# Patient Record
Sex: Female | Born: 1940 | Race: White | Hispanic: No | Marital: Married | Smoking: Former smoker
Health system: Southern US, Community
[De-identification: ages and names within clinical notes are randomized; demographics above are authoritative.]

## PROBLEM LIST (undated history)

## (undated) DIAGNOSIS — M810 Age-related osteoporosis without current pathological fracture: Secondary | ICD-10-CM

## (undated) DIAGNOSIS — M1990 Unspecified osteoarthritis, unspecified site: Secondary | ICD-10-CM

## (undated) DIAGNOSIS — M549 Dorsalgia, unspecified: Secondary | ICD-10-CM

## (undated) DIAGNOSIS — Z789 Other specified health status: Secondary | ICD-10-CM

## (undated) DIAGNOSIS — G4700 Insomnia, unspecified: Secondary | ICD-10-CM

## (undated) HISTORY — PX: HEMORROIDECTOMY: SUR656

## (undated) HISTORY — PX: BREAST LUMPECTOMY: SHX2

## (undated) HISTORY — PX: GASTRIC BYPASS: SHX52

## (undated) HISTORY — PX: ABDOMINAL HYSTERECTOMY: SHX81

## (undated) HISTORY — PX: COSMETIC SURGERY: SHX468

## (undated) HISTORY — PX: SHOULDER SURGERY: SHX246

## (undated) HISTORY — PX: REPLACEMENT TOTAL KNEE: SUR1224

## (undated) HISTORY — PX: KNEE ARTHROSCOPY: SUR90

---

## 2006-04-30 ENCOUNTER — Ambulatory Visit: Payer: Self-pay | Admitting: Gastroenterology

## 2006-10-16 ENCOUNTER — Ambulatory Visit: Payer: Self-pay | Admitting: Internal Medicine

## 2006-10-27 ENCOUNTER — Ambulatory Visit: Payer: Self-pay | Admitting: Internal Medicine

## 2006-11-17 ENCOUNTER — Ambulatory Visit: Payer: Self-pay | Admitting: Specialist

## 2007-08-18 ENCOUNTER — Ambulatory Visit: Payer: Self-pay | Admitting: Internal Medicine

## 2009-05-11 ENCOUNTER — Ambulatory Visit: Payer: Self-pay | Admitting: Unknown Physician Specialty

## 2009-05-19 ENCOUNTER — Ambulatory Visit: Payer: Self-pay | Admitting: Unknown Physician Specialty

## 2009-05-31 ENCOUNTER — Ambulatory Visit: Payer: Self-pay | Admitting: Unknown Physician Specialty

## 2009-10-25 ENCOUNTER — Ambulatory Visit: Payer: Self-pay | Admitting: Internal Medicine

## 2009-11-02 ENCOUNTER — Ambulatory Visit: Payer: Self-pay | Admitting: Internal Medicine

## 2010-09-07 ENCOUNTER — Ambulatory Visit: Payer: Self-pay | Admitting: Family

## 2010-10-26 ENCOUNTER — Ambulatory Visit: Payer: Self-pay | Admitting: Internal Medicine

## 2010-11-26 ENCOUNTER — Ambulatory Visit: Payer: Self-pay | Admitting: Internal Medicine

## 2011-01-17 ENCOUNTER — Ambulatory Visit: Payer: Self-pay | Admitting: Bariatrics

## 2011-02-26 ENCOUNTER — Ambulatory Visit: Payer: Self-pay | Admitting: Unknown Physician Specialty

## 2012-02-24 ENCOUNTER — Ambulatory Visit: Payer: Self-pay | Admitting: Orthopaedic Surgery

## 2012-05-23 ENCOUNTER — Ambulatory Visit: Payer: Self-pay | Admitting: Internal Medicine

## 2012-07-29 ENCOUNTER — Ambulatory Visit: Payer: Self-pay | Admitting: Internal Medicine

## 2012-10-16 ENCOUNTER — Ambulatory Visit: Payer: Self-pay | Admitting: Otolaryngology

## 2013-05-03 DIAGNOSIS — Z961 Presence of intraocular lens: Secondary | ICD-10-CM | POA: Diagnosis not present

## 2013-05-07 DIAGNOSIS — J328 Other chronic sinusitis: Secondary | ICD-10-CM | POA: Diagnosis not present

## 2013-05-07 DIAGNOSIS — J342 Deviated nasal septum: Secondary | ICD-10-CM | POA: Diagnosis not present

## 2013-05-07 DIAGNOSIS — J018 Other acute sinusitis: Secondary | ICD-10-CM | POA: Diagnosis not present

## 2013-05-07 DIAGNOSIS — R51 Headache: Secondary | ICD-10-CM | POA: Diagnosis not present

## 2013-05-24 ENCOUNTER — Ambulatory Visit: Payer: Self-pay | Admitting: Otolaryngology

## 2013-05-24 DIAGNOSIS — S022XXA Fracture of nasal bones, initial encounter for closed fracture: Secondary | ICD-10-CM | POA: Diagnosis not present

## 2013-05-24 DIAGNOSIS — J3489 Other specified disorders of nose and nasal sinuses: Secondary | ICD-10-CM | POA: Diagnosis not present

## 2013-05-24 DIAGNOSIS — M2749 Other cysts of jaw: Secondary | ICD-10-CM | POA: Diagnosis not present

## 2013-05-24 DIAGNOSIS — L988 Other specified disorders of the skin and subcutaneous tissue: Secondary | ICD-10-CM | POA: Diagnosis not present

## 2013-08-03 DIAGNOSIS — L821 Other seborrheic keratosis: Secondary | ICD-10-CM | POA: Diagnosis not present

## 2013-08-03 DIAGNOSIS — D1801 Hemangioma of skin and subcutaneous tissue: Secondary | ICD-10-CM | POA: Diagnosis not present

## 2013-08-03 DIAGNOSIS — M6740 Ganglion, unspecified site: Secondary | ICD-10-CM | POA: Diagnosis not present

## 2013-08-03 DIAGNOSIS — D485 Neoplasm of uncertain behavior of skin: Secondary | ICD-10-CM | POA: Diagnosis not present

## 2013-08-05 ENCOUNTER — Ambulatory Visit: Payer: Self-pay | Admitting: Internal Medicine

## 2013-08-05 DIAGNOSIS — S0990XA Unspecified injury of head, initial encounter: Secondary | ICD-10-CM | POA: Diagnosis not present

## 2013-08-05 DIAGNOSIS — R51 Headache: Secondary | ICD-10-CM | POA: Diagnosis not present

## 2013-08-09 DIAGNOSIS — N644 Mastodynia: Secondary | ICD-10-CM | POA: Diagnosis not present

## 2013-08-09 DIAGNOSIS — Z9189 Other specified personal risk factors, not elsewhere classified: Secondary | ICD-10-CM | POA: Diagnosis not present

## 2013-08-16 DIAGNOSIS — J328 Other chronic sinusitis: Secondary | ICD-10-CM | POA: Diagnosis not present

## 2013-08-18 DIAGNOSIS — Z9884 Bariatric surgery status: Secondary | ICD-10-CM | POA: Diagnosis not present

## 2013-09-08 DIAGNOSIS — I10 Essential (primary) hypertension: Secondary | ICD-10-CM | POA: Diagnosis not present

## 2013-09-08 DIAGNOSIS — G4733 Obstructive sleep apnea (adult) (pediatric): Secondary | ICD-10-CM | POA: Diagnosis not present

## 2013-09-09 DIAGNOSIS — Z13820 Encounter for screening for osteoporosis: Secondary | ICD-10-CM | POA: Diagnosis not present

## 2013-09-09 DIAGNOSIS — Z780 Asymptomatic menopausal state: Secondary | ICD-10-CM | POA: Diagnosis not present

## 2013-09-09 DIAGNOSIS — E2839 Other primary ovarian failure: Secondary | ICD-10-CM | POA: Diagnosis not present

## 2013-09-09 DIAGNOSIS — Z1231 Encounter for screening mammogram for malignant neoplasm of breast: Secondary | ICD-10-CM | POA: Diagnosis not present

## 2013-09-09 DIAGNOSIS — R928 Other abnormal and inconclusive findings on diagnostic imaging of breast: Secondary | ICD-10-CM | POA: Diagnosis not present

## 2013-09-14 DIAGNOSIS — C44319 Basal cell carcinoma of skin of other parts of face: Secondary | ICD-10-CM | POA: Diagnosis not present

## 2013-09-14 DIAGNOSIS — Z85828 Personal history of other malignant neoplasm of skin: Secondary | ICD-10-CM | POA: Diagnosis not present

## 2013-09-27 DIAGNOSIS — Z0000 Encounter for general adult medical examination without abnormal findings: Secondary | ICD-10-CM | POA: Diagnosis not present

## 2013-10-07 ENCOUNTER — Ambulatory Visit: Payer: Self-pay | Admitting: Internal Medicine

## 2013-10-07 DIAGNOSIS — M79609 Pain in unspecified limb: Secondary | ICD-10-CM | POA: Diagnosis not present

## 2013-10-07 DIAGNOSIS — M259 Joint disorder, unspecified: Secondary | ICD-10-CM | POA: Diagnosis not present

## 2013-10-07 DIAGNOSIS — G4733 Obstructive sleep apnea (adult) (pediatric): Secondary | ICD-10-CM | POA: Diagnosis not present

## 2013-10-07 DIAGNOSIS — I10 Essential (primary) hypertension: Secondary | ICD-10-CM | POA: Diagnosis not present

## 2013-10-07 DIAGNOSIS — Q684 Congenital bowing of tibia and fibula: Secondary | ICD-10-CM | POA: Diagnosis not present

## 2013-10-07 DIAGNOSIS — S82109A Unspecified fracture of upper end of unspecified tibia, initial encounter for closed fracture: Secondary | ICD-10-CM | POA: Diagnosis not present

## 2013-10-11 DIAGNOSIS — K219 Gastro-esophageal reflux disease without esophagitis: Secondary | ICD-10-CM | POA: Diagnosis not present

## 2013-10-11 DIAGNOSIS — M2010 Hallux valgus (acquired), unspecified foot: Secondary | ICD-10-CM | POA: Diagnosis not present

## 2013-10-11 DIAGNOSIS — E785 Hyperlipidemia, unspecified: Secondary | ICD-10-CM | POA: Diagnosis not present

## 2013-10-11 DIAGNOSIS — Q684 Congenital bowing of tibia and fibula: Secondary | ICD-10-CM | POA: Diagnosis not present

## 2013-11-15 DIAGNOSIS — J45909 Unspecified asthma, uncomplicated: Secondary | ICD-10-CM | POA: Diagnosis not present

## 2013-11-15 DIAGNOSIS — G4733 Obstructive sleep apnea (adult) (pediatric): Secondary | ICD-10-CM | POA: Diagnosis not present

## 2013-11-15 DIAGNOSIS — I10 Essential (primary) hypertension: Secondary | ICD-10-CM | POA: Diagnosis not present

## 2013-11-15 DIAGNOSIS — J209 Acute bronchitis, unspecified: Secondary | ICD-10-CM | POA: Diagnosis not present

## 2013-11-17 ENCOUNTER — Ambulatory Visit: Payer: Self-pay | Admitting: Internal Medicine

## 2013-11-17 DIAGNOSIS — R079 Chest pain, unspecified: Secondary | ICD-10-CM | POA: Diagnosis not present

## 2013-11-17 DIAGNOSIS — J690 Pneumonitis due to inhalation of food and vomit: Secondary | ICD-10-CM | POA: Diagnosis not present

## 2013-11-17 DIAGNOSIS — R062 Wheezing: Secondary | ICD-10-CM | POA: Diagnosis not present

## 2013-11-17 DIAGNOSIS — J189 Pneumonia, unspecified organism: Secondary | ICD-10-CM | POA: Diagnosis not present

## 2013-11-17 DIAGNOSIS — R059 Cough, unspecified: Secondary | ICD-10-CM | POA: Diagnosis not present

## 2014-01-17 DIAGNOSIS — Z23 Encounter for immunization: Secondary | ICD-10-CM | POA: Diagnosis not present

## 2014-02-23 DIAGNOSIS — K589 Irritable bowel syndrome without diarrhea: Secondary | ICD-10-CM | POA: Diagnosis not present

## 2014-02-23 DIAGNOSIS — Z9884 Bariatric surgery status: Secondary | ICD-10-CM | POA: Diagnosis not present

## 2014-03-28 DIAGNOSIS — G4733 Obstructive sleep apnea (adult) (pediatric): Secondary | ICD-10-CM | POA: Diagnosis not present

## 2014-03-28 DIAGNOSIS — H538 Other visual disturbances: Secondary | ICD-10-CM | POA: Diagnosis not present

## 2014-03-29 DIAGNOSIS — H538 Other visual disturbances: Secondary | ICD-10-CM | POA: Diagnosis not present

## 2014-03-29 DIAGNOSIS — I10 Essential (primary) hypertension: Secondary | ICD-10-CM | POA: Diagnosis not present

## 2014-03-29 DIAGNOSIS — R5381 Other malaise: Secondary | ICD-10-CM | POA: Diagnosis not present

## 2014-03-29 DIAGNOSIS — E784 Other hyperlipidemia: Secondary | ICD-10-CM | POA: Diagnosis not present

## 2014-03-29 DIAGNOSIS — E441 Mild protein-calorie malnutrition: Secondary | ICD-10-CM | POA: Diagnosis not present

## 2014-04-07 DIAGNOSIS — G4733 Obstructive sleep apnea (adult) (pediatric): Secondary | ICD-10-CM | POA: Diagnosis not present

## 2014-04-07 DIAGNOSIS — I10 Essential (primary) hypertension: Secondary | ICD-10-CM | POA: Diagnosis not present

## 2014-04-07 DIAGNOSIS — E441 Mild protein-calorie malnutrition: Secondary | ICD-10-CM | POA: Diagnosis not present

## 2014-04-07 DIAGNOSIS — K219 Gastro-esophageal reflux disease without esophagitis: Secondary | ICD-10-CM | POA: Diagnosis not present

## 2014-04-11 DIAGNOSIS — H59093 Other disorders of the eye following cataract surgery, bilateral: Secondary | ICD-10-CM | POA: Diagnosis not present

## 2014-04-17 DIAGNOSIS — H66001 Acute suppurative otitis media without spontaneous rupture of ear drum, right ear: Secondary | ICD-10-CM | POA: Diagnosis not present

## 2014-04-17 DIAGNOSIS — J0100 Acute maxillary sinusitis, unspecified: Secondary | ICD-10-CM | POA: Diagnosis not present

## 2014-04-22 DIAGNOSIS — H26492 Other secondary cataract, left eye: Secondary | ICD-10-CM | POA: Diagnosis not present

## 2014-04-25 ENCOUNTER — Ambulatory Visit: Payer: Self-pay | Admitting: Internal Medicine

## 2014-04-25 DIAGNOSIS — J209 Acute bronchitis, unspecified: Secondary | ICD-10-CM | POA: Diagnosis not present

## 2014-04-25 DIAGNOSIS — J320 Chronic maxillary sinusitis: Secondary | ICD-10-CM | POA: Diagnosis not present

## 2014-04-25 DIAGNOSIS — R05 Cough: Secondary | ICD-10-CM | POA: Diagnosis not present

## 2014-04-25 DIAGNOSIS — I10 Essential (primary) hypertension: Secondary | ICD-10-CM | POA: Diagnosis not present

## 2014-04-25 DIAGNOSIS — J45909 Unspecified asthma, uncomplicated: Secondary | ICD-10-CM | POA: Diagnosis not present

## 2014-04-28 DIAGNOSIS — G4733 Obstructive sleep apnea (adult) (pediatric): Secondary | ICD-10-CM | POA: Diagnosis not present

## 2014-04-28 DIAGNOSIS — I10 Essential (primary) hypertension: Secondary | ICD-10-CM | POA: Diagnosis not present

## 2014-04-28 DIAGNOSIS — J209 Acute bronchitis, unspecified: Secondary | ICD-10-CM | POA: Diagnosis not present

## 2014-05-16 DIAGNOSIS — H26491 Other secondary cataract, right eye: Secondary | ICD-10-CM | POA: Diagnosis not present

## 2014-08-26 DIAGNOSIS — H01119 Allergic dermatitis of unspecified eye, unspecified eyelid: Secondary | ICD-10-CM | POA: Diagnosis not present

## 2014-08-26 DIAGNOSIS — J309 Allergic rhinitis, unspecified: Secondary | ICD-10-CM | POA: Diagnosis not present

## 2014-09-01 DIAGNOSIS — G4733 Obstructive sleep apnea (adult) (pediatric): Secondary | ICD-10-CM | POA: Diagnosis not present

## 2014-09-01 DIAGNOSIS — I10 Essential (primary) hypertension: Secondary | ICD-10-CM | POA: Diagnosis not present

## 2014-09-01 DIAGNOSIS — R5381 Other malaise: Secondary | ICD-10-CM | POA: Diagnosis not present

## 2014-09-01 DIAGNOSIS — R238 Other skin changes: Secondary | ICD-10-CM | POA: Diagnosis not present

## 2014-09-07 DIAGNOSIS — M810 Age-related osteoporosis without current pathological fracture: Secondary | ICD-10-CM | POA: Diagnosis not present

## 2014-09-12 DIAGNOSIS — R921 Mammographic calcification found on diagnostic imaging of breast: Secondary | ICD-10-CM | POA: Diagnosis not present

## 2014-09-12 DIAGNOSIS — R922 Inconclusive mammogram: Secondary | ICD-10-CM | POA: Diagnosis not present

## 2014-09-12 DIAGNOSIS — Z1231 Encounter for screening mammogram for malignant neoplasm of breast: Secondary | ICD-10-CM | POA: Diagnosis not present

## 2014-09-16 ENCOUNTER — Observation Stay
Admission: EM | Admit: 2014-09-16 | Discharge: 2014-09-18 | Disposition: A | Discharge status: Home / Self Care | Payer: Medicare Other | Attending: Internal Medicine | Admitting: Internal Medicine

## 2014-09-16 DIAGNOSIS — Z90710 Acquired absence of both cervix and uterus: Secondary | ICD-10-CM | POA: Diagnosis not present

## 2014-09-16 DIAGNOSIS — Z809 Family history of malignant neoplasm, unspecified: Secondary | ICD-10-CM | POA: Insufficient documentation

## 2014-09-16 DIAGNOSIS — S32000A Wedge compression fracture of unspecified lumbar vertebra, initial encounter for closed fracture: Secondary | ICD-10-CM

## 2014-09-16 DIAGNOSIS — M5126 Other intervertebral disc displacement, lumbar region: Secondary | ICD-10-CM | POA: Diagnosis not present

## 2014-09-16 DIAGNOSIS — Z8249 Family history of ischemic heart disease and other diseases of the circulatory system: Secondary | ICD-10-CM | POA: Insufficient documentation

## 2014-09-16 DIAGNOSIS — R262 Difficulty in walking, not elsewhere classified: Secondary | ICD-10-CM | POA: Diagnosis not present

## 2014-09-16 DIAGNOSIS — R29890 Loss of height: Secondary | ICD-10-CM | POA: Insufficient documentation

## 2014-09-16 DIAGNOSIS — Z87891 Personal history of nicotine dependence: Secondary | ICD-10-CM | POA: Insufficient documentation

## 2014-09-16 DIAGNOSIS — M25551 Pain in right hip: Secondary | ICD-10-CM | POA: Insufficient documentation

## 2014-09-16 DIAGNOSIS — I10 Essential (primary) hypertension: Secondary | ICD-10-CM | POA: Diagnosis not present

## 2014-09-16 DIAGNOSIS — M549 Dorsalgia, unspecified: Secondary | ICD-10-CM | POA: Diagnosis not present

## 2014-09-16 DIAGNOSIS — Z9884 Bariatric surgery status: Secondary | ICD-10-CM | POA: Insufficient documentation

## 2014-09-16 DIAGNOSIS — G319 Degenerative disease of nervous system, unspecified: Secondary | ICD-10-CM | POA: Insufficient documentation

## 2014-09-16 DIAGNOSIS — W19XXXA Unspecified fall, initial encounter: Secondary | ICD-10-CM | POA: Diagnosis not present

## 2014-09-16 DIAGNOSIS — I739 Peripheral vascular disease, unspecified: Secondary | ICD-10-CM | POA: Insufficient documentation

## 2014-09-16 DIAGNOSIS — Z96659 Presence of unspecified artificial knee joint: Secondary | ICD-10-CM | POA: Diagnosis not present

## 2014-09-16 DIAGNOSIS — Z880 Allergy status to penicillin: Secondary | ICD-10-CM | POA: Diagnosis not present

## 2014-09-16 DIAGNOSIS — S0093XA Contusion of unspecified part of head, initial encounter: Secondary | ICD-10-CM | POA: Diagnosis not present

## 2014-09-16 DIAGNOSIS — Z79899 Other long term (current) drug therapy: Secondary | ICD-10-CM | POA: Insufficient documentation

## 2014-09-16 DIAGNOSIS — Z8489 Family history of other specified conditions: Secondary | ICD-10-CM | POA: Diagnosis not present

## 2014-09-16 DIAGNOSIS — E876 Hypokalemia: Secondary | ICD-10-CM | POA: Diagnosis not present

## 2014-09-16 DIAGNOSIS — S32591A Other specified fracture of right pubis, initial encounter for closed fracture: Secondary | ICD-10-CM | POA: Diagnosis not present

## 2014-09-16 DIAGNOSIS — S32030A Wedge compression fracture of third lumbar vertebra, initial encounter for closed fracture: Secondary | ICD-10-CM | POA: Diagnosis not present

## 2014-09-16 DIAGNOSIS — S32039A Unspecified fracture of third lumbar vertebra, initial encounter for closed fracture: Secondary | ICD-10-CM | POA: Diagnosis not present

## 2014-09-16 DIAGNOSIS — Y93H9 Activity, other involving exterior property and land maintenance, building and construction: Secondary | ICD-10-CM | POA: Diagnosis not present

## 2014-09-16 DIAGNOSIS — S79911A Unspecified injury of right hip, initial encounter: Secondary | ICD-10-CM | POA: Diagnosis not present

## 2014-09-16 DIAGNOSIS — S0990XA Unspecified injury of head, initial encounter: Secondary | ICD-10-CM | POA: Diagnosis not present

## 2014-09-16 DIAGNOSIS — Z7982 Long term (current) use of aspirin: Secondary | ICD-10-CM | POA: Diagnosis not present

## 2014-09-16 DIAGNOSIS — S3992XA Unspecified injury of lower back, initial encounter: Secondary | ICD-10-CM | POA: Diagnosis not present

## 2014-09-16 HISTORY — DX: Other specified health status: Z78.9

## 2014-09-16 NOTE — ED Notes (Addendum)
Pt presents to ED by [facility] after she fell outside when she stepped in a hole in her driveway approx 6 hours ago while pressure washing her truck. VS within normal limits and FSBS 105 per EMS. Pt c/o right groin pain and is said to be non-weight bearing on her right leg. Pt states her pain sitting up was too severe to attempt to driving herself to ED. Pt states she also hit the back of her head on her truck. Denies loc. Pt able to answer questions without difficulty. Speech clear. No distress or obvious deformities noted.

## 2014-09-17 ENCOUNTER — Emergency Department: Payer: Medicare Other

## 2014-09-17 ENCOUNTER — Encounter: Payer: Self-pay | Admitting: Emergency Medicine

## 2014-09-17 DIAGNOSIS — S32039A Unspecified fracture of third lumbar vertebra, initial encounter for closed fracture: Secondary | ICD-10-CM | POA: Diagnosis not present

## 2014-09-17 DIAGNOSIS — M25551 Pain in right hip: Secondary | ICD-10-CM | POA: Diagnosis not present

## 2014-09-17 DIAGNOSIS — S32501A Unspecified fracture of right pubis, initial encounter for closed fracture: Secondary | ICD-10-CM | POA: Diagnosis not present

## 2014-09-17 DIAGNOSIS — S32591A Other specified fracture of right pubis, initial encounter for closed fracture: Secondary | ICD-10-CM | POA: Diagnosis present

## 2014-09-17 DIAGNOSIS — S0990XA Unspecified injury of head, initial encounter: Secondary | ICD-10-CM | POA: Diagnosis not present

## 2014-09-17 DIAGNOSIS — M545 Low back pain: Secondary | ICD-10-CM | POA: Diagnosis not present

## 2014-09-17 DIAGNOSIS — E876 Hypokalemia: Secondary | ICD-10-CM | POA: Diagnosis not present

## 2014-09-17 DIAGNOSIS — S79911A Unspecified injury of right hip, initial encounter: Secondary | ICD-10-CM | POA: Diagnosis not present

## 2014-09-17 DIAGNOSIS — S32030A Wedge compression fracture of third lumbar vertebra, initial encounter for closed fracture: Secondary | ICD-10-CM | POA: Diagnosis present

## 2014-09-17 DIAGNOSIS — S32020A Wedge compression fracture of second lumbar vertebra, initial encounter for closed fracture: Secondary | ICD-10-CM | POA: Diagnosis not present

## 2014-09-17 DIAGNOSIS — S3992XA Unspecified injury of lower back, initial encounter: Secondary | ICD-10-CM | POA: Diagnosis not present

## 2014-09-17 LAB — COMPREHENSIVE METABOLIC PANEL
ALBUMIN: 3.4 g/dL — AB (ref 3.5–5.0)
ALK PHOS: 63 U/L (ref 38–126)
ALT: 27 U/L (ref 14–54)
AST: 45 U/L — ABNORMAL HIGH (ref 15–41)
Anion gap: 12 (ref 5–15)
BILIRUBIN TOTAL: 0.8 mg/dL (ref 0.3–1.2)
BUN: 10 mg/dL (ref 6–20)
CHLORIDE: 103 mmol/L (ref 101–111)
CO2: 26 mmol/L (ref 22–32)
CREATININE: 0.52 mg/dL (ref 0.44–1.00)
Calcium: 8.1 mg/dL — ABNORMAL LOW (ref 8.9–10.3)
GFR calc non Af Amer: >60 mL/min (ref >60)
Glucose, Bld: 100 mg/dL — ABNORMAL HIGH (ref 65–99)
POTASSIUM: 3.4 mmol/L — AB (ref 3.5–5.1)
SODIUM: 141 mmol/L (ref 135–145)
TOTAL PROTEIN: 5.7 g/dL — AB (ref 6.5–8.1)

## 2014-09-17 LAB — CBC
HEMATOCRIT: 39.1 % (ref 35.0–47.0)
HEMOGLOBIN: 12.8 g/dL (ref 12.0–16.0)
MCH: 31.4 pg (ref 26.0–34.0)
MCHC: 32.8 g/dL (ref 32.0–36.0)
MCV: 95.8 fL (ref 80.0–100.0)
PLATELETS: 168 10*3/uL (ref 150–440)
RBC: 4.09 MIL/uL (ref 3.80–5.20)
RDW: 13.7 % (ref 11.5–14.5)
WBC: 7.3 10*3/uL (ref 3.6–11.0)

## 2014-09-17 LAB — PROTIME-INR
INR: 0.97
PROTHROMBIN TIME: 13.1 s (ref 11.4–15.0)

## 2014-09-17 LAB — APTT: APTT: 29 s (ref 24–36)

## 2014-09-17 MED ORDER — HEPARIN SODIUM (PORCINE) 5000 UNIT/ML IJ SOLN
5000.0000 [IU] | Freq: Three times a day (TID) | INTRAMUSCULAR | Status: DC
  Administered 2014-09-17 – 2014-09-18 (×4): 5000 [IU] via SUBCUTANEOUS
  Filled 2014-09-17 (×4): qty 1

## 2014-09-17 MED ORDER — MORPHINE SULFATE 4 MG/ML IJ SOLN: 4.0000 mg | Freq: Once | INTRAMUSCULAR | Status: AC

## 2014-09-17 MED ORDER — BISACODYL 10 MG RE SUPP: 10.0000 mg | Freq: Every day | RECTAL | Status: DC | PRN

## 2014-09-17 MED ORDER — ESTROGENS CONJUGATED 0.625 MG PO TABS
0.6250 mg | ORAL_TABLET | Freq: Every day | ORAL | Status: DC
  Administered 2014-09-17 – 2014-09-18 (×2): 0.625 mg via ORAL
  Filled 2014-09-17 (×2): qty 1

## 2014-09-17 MED ORDER — DOCUSATE SODIUM 100 MG PO CAPS
100.0000 mg | ORAL_CAPSULE | Freq: Two times a day (BID) | ORAL | Status: DC
  Administered 2014-09-17 – 2014-09-18 (×3): 100 mg via ORAL
  Filled 2014-09-17 (×3): qty 1

## 2014-09-17 MED ORDER — POTASSIUM CHLORIDE CRYS ER 20 MEQ PO TBCR
40.0000 meq | EXTENDED_RELEASE_TABLET | Freq: Once | ORAL | Status: AC
  Administered 2014-09-17: 40 meq via ORAL
  Filled 2014-09-17: qty 2

## 2014-09-17 MED ORDER — CALCIUM CARBONATE ANTACID 500 MG PO CHEW
400.0000 mg | CHEWABLE_TABLET | Freq: Two times a day (BID) | ORAL | Status: DC
  Administered 2014-09-17 – 2014-09-18 (×3): 400 mg via ORAL
  Filled 2014-09-17 (×3): qty 2

## 2014-09-17 MED ORDER — ALPRAZOLAM 0.5 MG PO TABS
0.5000 mg | ORAL_TABLET | Freq: Every evening | ORAL | Status: DC | PRN
  Administered 2014-09-17: 0.5 mg via ORAL
  Filled 2014-09-17: qty 1

## 2014-09-17 MED ORDER — ACETAMINOPHEN 325 MG PO TABS: 650.0000 mg | ORAL_TABLET | Freq: Four times a day (QID) | ORAL | Status: DC | PRN

## 2014-09-17 MED ORDER — HYDROCODONE-ACETAMINOPHEN 5-325 MG PO TABS
1.0000 | ORAL_TABLET | ORAL | Status: DC | PRN
  Administered 2014-09-17 – 2014-09-18 (×6): 1 via ORAL
  Filled 2014-09-17 (×6): qty 1

## 2014-09-17 MED ORDER — MAGNESIUM HYDROXIDE 400 MG/5ML PO SUSP: 30.0000 mL | Freq: Every day | ORAL | Status: DC | PRN

## 2014-09-17 MED ORDER — MORPHINE SULFATE 10 MG/ML IJ SOLN
INTRAMUSCULAR | Status: AC
  Administered 2014-09-17: 4 mg
  Filled 2014-09-17: qty 1

## 2014-09-17 MED ORDER — ONDANSETRON HCL 4 MG/2ML IJ SOLN
4.0000 mg | Freq: Four times a day (QID) | INTRAMUSCULAR | Status: DC | PRN
  Administered 2014-09-17 (×2): 4 mg via INTRAVENOUS
  Filled 2014-09-17 (×3): qty 2

## 2014-09-17 MED ORDER — ADULT MULTIVITAMIN W/MINERALS CH
1.0000 | ORAL_TABLET | Freq: Two times a day (BID) | ORAL | Status: DC
  Administered 2014-09-17 – 2014-09-18 (×3): 1 via ORAL
  Filled 2014-09-17 (×3): qty 1

## 2014-09-17 MED ORDER — IBANDRONATE SODIUM 150 MG PO TABS: 150.0000 mg | ORAL_TABLET | ORAL | Status: DC

## 2014-09-17 MED ORDER — ONDANSETRON HCL 4 MG PO TABS
4.0000 mg | ORAL_TABLET | Freq: Four times a day (QID) | ORAL | Status: DC | PRN
  Filled 2014-09-17 (×2): qty 1

## 2014-09-17 MED ORDER — ONDANSETRON HCL 4 MG/2ML IJ SOLN
4.0000 mg | Freq: Once | INTRAMUSCULAR | Status: AC
  Administered 2014-09-17: 4 mg via INTRAVENOUS

## 2014-09-17 MED ORDER — ASPIRIN EC 81 MG PO TBEC
81.0000 mg | DELAYED_RELEASE_TABLET | Freq: Every day | ORAL | Status: DC
  Administered 2014-09-17 – 2014-09-18 (×2): 81 mg via ORAL
  Filled 2014-09-17 (×2): qty 1

## 2014-09-17 MED ORDER — VITAMIN D 1000 UNITS PO TABS
1000.0000 [IU] | ORAL_TABLET | Freq: Every day | ORAL | Status: DC
  Administered 2014-09-17 – 2014-09-18 (×2): 1000 [IU] via ORAL
  Filled 2014-09-17 (×2): qty 1

## 2014-09-17 MED ORDER — ACETAMINOPHEN 650 MG RE SUPP: 650.0000 mg | Freq: Four times a day (QID) | RECTAL | Status: DC | PRN

## 2014-09-17 MED ORDER — ONDANSETRON HCL 4 MG/2ML IJ SOLN
INTRAMUSCULAR | Status: AC
  Administered 2014-09-17: 4 mg via INTRAVENOUS
  Filled 2014-09-17: qty 2

## 2014-09-17 MED ORDER — CALCIUM CARBONATE 1250 (500 CA) MG PO TABS
1.0000 | ORAL_TABLET | Freq: Two times a day (BID) | ORAL | Status: DC
  Filled 2014-09-17 (×2): qty 1

## 2014-09-17 MED ORDER — ASPIRIN EC 81 MG PO TBEC: 81.0000 mg | DELAYED_RELEASE_TABLET | Freq: Every day | ORAL | Status: DC

## 2014-09-17 MED ORDER — MORPHINE SULFATE 4 MG/ML IJ SOLN: 4.0000 mg | INTRAMUSCULAR | Status: DC | PRN

## 2014-09-17 NOTE — H&P (Signed)
[place] [facility] [place] NAME: Sonya Park    MR#:  [id number]  DATE OF BIRTH:  24-Sep-1940  DATE OF ADMISSION:  09/16/2014  PRIMARY CARE PHYSICIAN: Cletis Athens, MD   REQUESTING/REFERRING PHYSICIAN: Maximino Greenland  CHIEF COMPLAINT:   Chief Complaint  Patient presents with  . Fall  . Groin Pain  . Hip Pain    HISTORY OF PRESENT ILLNESS:  Sonya Park  is a 74 y.o. female with no significant past medical history presents to the emergency room with the complaints of right groin pain and hip and back pain following mechanical fall sustaining yesterday afternoon. Patient states that while she was washing her car she stepped into a hole and fell backwards and hurt her back and groin. Since then she is having right groin and back pain and inability to walk head, hence came to the emergency room for evaluation. She also hit her head but denies any loss of consciousness. No focal weakness or numbness, no chest pain, no palpitations. In the emergency room patient was evaluated by the ED physician and x-rays revealed minimally displaced fracture of right inferior pubic ramus and a CT of the lumbar spine revealed compression fracture L3 vertebra. CT head was negative for any acute injury. Patient was given IV pain medications following which her pain is under reasonable control. She is not able to ablate without any help and continues to have intermittent pain, hence hospitalist service was consulted for further management.  PAST MEDICAL HISTORY:   Past Medical History  Diagnosis Date  . Medical history non-contributory     PAST SURGICAL HISTORY:   Past Surgical History  Procedure Laterality Date  . Abdominal hysterectomy    . Replacement total knee    . Gastric bypass      X2  . Hemoroid    . Cosmetic surgery    . Knee arthroscopy    . Joint replacement      SOCIAL HISTORY:   History  Substance Use Topics  . Smoking status: Former  Research scientist (life sciences)  . Smokeless tobacco: Never Used  . Alcohol Use: 1.8 oz/week    3 Glasses of wine per week    FAMILY HISTORY:   Family History  Problem Relation Age of Onset  . Liver disease Mother   . Heart attack Father   . Cancer Father   . CAD Sister     DRUG ALLERGIES:   Allergies  Allergen Reactions  . Penicillins Swelling    REVIEW OF SYSTEMS:   Review of Systems  Constitutional: Negative for fever, chills and malaise/fatigue.  HENT: Negative for ear pain, hearing loss, nosebleeds, sore throat and tinnitus.   Eyes: Negative for blurred vision, double vision, pain, discharge and redness.  Respiratory: Negative for cough, hemoptysis, sputum production, shortness of breath and wheezing.   Cardiovascular: Negative for chest pain, palpitations, orthopnea and leg swelling.  Gastrointestinal: Negative for nausea, vomiting, abdominal pain, diarrhea, constipation, blood in stool and melena.  Genitourinary: Negative for dysuria, urgency, frequency and hematuria.  Musculoskeletal: Positive for back pain and joint pain. Negative for neck pain.       Right hip pain and right groin pain as noted in history of present illness following mechanical fall.  Skin: Negative for itching and rash.  Neurological: Negative for dizziness, tingling, sensory change, focal weakness and seizures.  Endo/Heme/Allergies: Does not bruise/bleed easily.  Psychiatric/Behavioral: Negative for depression. The patient is not nervous/anxious.     MEDICATIONS AT  HOME:   Prior to Admission medications   Medication Sig Start Date End Date Taking? Authorizing Provider  ALPRAZolam Duanne Moron) 0.5 MG tablet Take 0.5 mg by mouth at bedtime as needed for anxiety.   Yes Historical Provider, MD  aspirin 81 MG tablet Take 81 mg by mouth daily.   Yes Historical Provider, MD  BIOTIN PO Take 1 capsule by mouth daily.   Yes Historical Provider, MD  CALCIUM CARBONATE PO Take 1 tablet by mouth 2 (two) times daily.   Yes Historical  Provider, MD  cholecalciferol (VITAMIN D) 1000 UNITS tablet Take 1,000 Units by mouth daily.   Yes Historical Provider, MD  DiphenhydrAMINE HCl (ZZZQUIL) 50 MG/30ML LIQD Take 30 mLs by mouth at bedtime as needed (for sleep).   Yes Historical Provider, MD  estrogens, conjugated, (PREMARIN) 0.625 MG tablet Take 0.625 mg by mouth daily. Take daily for 21 days then do not take for 7 days.   Yes Historical Provider, MD  ibandronate (BONIVA) 150 MG tablet Take 150 mg by mouth every 30 (thirty) days. Take in the morning with a full glass of water, on an empty stomach, and do not take anything else by mouth or lie down for the next 30 min.   Yes Historical Provider, MD  Multiple Vitamins-Minerals (MULTIVITAMIN PO) Take 1 tablet by mouth 2 (two) times daily.   Yes Historical Provider, MD  Probiotic Product (PROBIOTIC PO) Take 1 capsule by mouth daily.   Yes Historical Provider, MD      VITAL SIGNS:  Blood pressure 137/80, pulse 87, temperature 98 F (36.7 C), temperature source Oral, resp. rate 20, height 5\' 7"  (1.702 m), weight 70.761 kg (156 lb), SpO2 97 %.  PHYSICAL EXAMINATION:  Physical Exam  Constitutional: She is oriented to person, place, and time. She appears well-developed and well-nourished.  HENT:  Head: Normocephalic and atraumatic.  Right Ear: External ear normal.  Left Ear: External ear normal.  Nose: Nose normal.  Mouth/Throat: Oropharynx is clear and moist. No oropharyngeal exudate.  Eyes: EOM are normal. Pupils are equal, round, and reactive to light. No scleral icterus.  Neck: Normal range of motion. Neck supple. No JVD present. No thyromegaly present.  Cardiovascular: Normal rate, regular rhythm, normal heart sounds and intact distal pulses.  Exam reveals no friction rub.   No murmur heard. Respiratory: Effort normal and breath sounds normal. No respiratory distress. She has no wheezes. She has no rales. She exhibits no tenderness.  GI: Soft. Bowel sounds are normal. She  exhibits no distension and no mass. There is no tenderness. There is no rebound and no guarding.  Musculoskeletal: She exhibits no edema.  Decreased range of motion of right hip. Tenderness over L3-L4 area.  Lymphadenopathy:    She has no cervical adenopathy.  Neurological: She is alert and oriented to person, place, and time. She has normal reflexes. She displays normal reflexes. No cranial nerve deficit. She exhibits normal muscle tone.  Skin: Skin is warm. No rash noted. No erythema.  Psychiatric: She has a normal mood and affect. Her behavior is normal. Thought content normal.   LABORATORY PANEL:   CBC  Recent Labs Lab 09/17/14 0141  WBC 7.3  HGB 12.8  HCT 39.1  PLT 168   ------------------------------------------------------------------------------------------------------------------  Chemistries   Recent Labs Lab 09/17/14 0141  NA 141  K 3.4*  CL 103  CO2 26  GLUCOSE 100*  BUN 10  CREATININE 0.52  CALCIUM 8.1*  AST 45*  ALT 27  ALKPHOS  63  BILITOT 0.8   ------------------------------------------------------------------------------------------------------------------  Cardiac Enzymes No results for input(s): TROPONINI in the last 168 hours. ------------------------------------------------------------------------------------------------------------------  RADIOLOGY:  Dg Lumbar Spine 2-3 Views  09/17/2014   CLINICAL DATA:  Status post fall, with groin pain and hip pain. Initial encounter.  EXAM: LUMBAR SPINE - 2-3 VIEW  COMPARISON:  CT of the abdomen and pelvis from 01/17/2011  FINDINGS: There is slight cortical irregularity along the left side of the superior endplate of L3, new from 2012. Would correlate for any associated symptoms, to exclude an underlying small endplate fracture. This could be chronic in nature. Vertebral bodies demonstrate normal alignment. Intervertebral disc spaces are preserved.  The visualized bowel gas pattern is unremarkable in  appearance; air and stool are noted within the colon. The sacroiliac joints are within normal limits.  IMPRESSION: Slight cortical irregularity along the left side of the superior endplate of L3, new from 2012. Would correlate for any associated symptoms, to exclude an underlying small template fracture. This could be chronic in nature.   Electronically Signed   By: Garald Balding M.D.   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 Concern for head injury. Initial encounter.  EXAM: CT HEAD WITHOUT CONTRAST  TECHNIQUE: Contiguous axial images were obtained from the base of the skull through the vertex without intravenous contrast.  COMPARISON:  CT of the head performed 08/05/2013  FINDINGS: There is no evidence of acute infarction, mass lesion, or intra- or extra-axial hemorrhage on CT.  Prominence of the ventricles and sulci reflects mild cortical volume loss. Mild cerebellar atrophy is noted. Scattered periventricular and subcortical white matter change likely reflects small vessel ischemic microangiopathy.  The brainstem and fourth ventricle are within normal limits. The basal ganglia are unremarkable in appearance. The cerebral hemispheres demonstrate grossly normal gray-white differentiation. No mass effect or midline shift is seen.  There is no evidence of fracture; visualized osseous structures are unremarkable in appearance. The visualized portions of the orbits are within normal limits. The paranasal sinuses and mastoid air cells are well-aerated. No significant soft tissue abnormalities are seen.  IMPRESSION: 1. No evidence of traumatic intracranial injury or fracture. 2. Mild cortical volume loss and scattered small vessel ischemic microangiopathy.   Electronically Signed   By: Garald Balding M.D.   On: 09/17/2014 01:16   Ct Lumbar Spine Wo Contrast  09/17/2014   CLINICAL DATA:  Right groin pain and unable to bear weight on right lower  extremity after falling when stepping in a hole.  EXAM: CT LUMBAR SPINE WITHOUT CONTRAST  TECHNIQUE: Multidetector CT imaging of the lumbar spine was performed without intravenous contrast administration. Multiplanar CT image reconstructions were also generated.  COMPARISON:  Radiographs 09/17/2014  FINDINGS: There is an acute fracture involving the left superior aspect of the L3 vertebral body with mild loss of height of approximately 6 mm. Pedicles are intact. Posterior elements are intact. Facet articulations are intact. Remainder of the lumbar vertebrae are intact.  There is no significant paraspinal hematoma.  There are mild circumferential disc bulge is at all of the lumbar levels, without significant central canal stenosis.  IMPRESSION: Acute fracture of the left superior aspect of the L3 vertebral body with superior endplate impaction by about 6 mm. This is a stable fracture.   Electronically Signed   By: Andreas Newport M.D.   On: 09/17/2014 03:24   Dg Hip Unilat With Pelvis 2-3  Views Right  09/17/2014   CLINICAL DATA:  Acute onset of right hip pain, status post fall. Initial encounter.  EXAM: RIGHT HIP (WITH PELVIS) 2-3 VIEWS  COMPARISON:  None.  FINDINGS: There is mild cortical irregularity along the right inferior pubic ramus, which could reflect a minimally displaced fracture. No additional fractures are seen.  Both femoral heads are seated normally within their respective acetabula. The proximal right femur appears intact. Mild degenerative change is noted at the lower lumbar spine. The sacroiliac joints are unremarkable in appearance.  The visualized bowel gas pattern is grossly unremarkable in appearance. Scattered phleboliths are noted within the pelvis.  IMPRESSION: Mild cortical irregularity along the right inferior pubic ramus could reflect a minimally displaced fracture.   Electronically Signed   By: Garald Balding M.D.   On: 09/17/2014 01:35    EKG:   Orders placed or performed during  the hospital encounter of 09/16/14  . ED EKG pending at this time   . ED EKG    IMPRESSION AND PLAN:   1. Fracture right inferior pubic ramus following mechanical fall. 2. Compression fracture L3 vertebra following mechanical fall. 3. Hypokalemia, mild.  Plan: Admit to MedSurg, IV pain control meds, PT consultation, Ortho consultation requested. Potassium supplementation, follow-up BMP.    All the records are reviewed and case discussed with ED provider. Management plans discussed with the patient, family and they are in agreement.  CODE STATUS: Full code  TOTAL TIME TAKING CARE OF THIS PATIENT: 50 minutes.    Juluis Mire M.D on 09/17/2014 at 5:01 AM  Between 7am to 6pm - Pager - [phone]  After 6pm go to [email or web address] - password [email or web address] [facility]  Office  [phone]  CC: Primary care physician; Cletis Athens, MD

## 2014-09-17 NOTE — Progress Notes (Signed)
Sonya Park    MR#:  [id number]  DATE OF BIRTH:  [birth year]  SUBJECTIVE:  Came in after mechanical fall at home. Right hip pain with movement  REVIEW OF SYSTEMS:    Review of Systems  Constitutional: Negative for fever, chills and weight loss.  HENT: Negative for ear discharge, ear pain and nosebleeds.   Eyes: Negative for blurred vision, pain and discharge.  Respiratory: Negative for sputum production, shortness of breath, wheezing and stridor.   Cardiovascular: Negative for chest pain, palpitations, orthopnea and PND.  Gastrointestinal: Negative for nausea, vomiting, abdominal pain and diarrhea.  Genitourinary: Negative for urgency and frequency.  Musculoskeletal: Positive for back pain and joint pain.  Neurological: Positive for weakness. Negative for sensory change, speech change and focal weakness.  Psychiatric/Behavioral: Negative for depression. The patient is not nervous/anxious.   All other systems reviewed and are negative.  Tolerating Diet:yes Tolerating PT: yes  DRUG ALLERGIES:   Allergies  Allergen Reactions  . Penicillins Swelling    VITALS:  Blood pressure 145/78, pulse 78, temperature 98.4 F (36.9 C), temperature source Oral, resp. rate 18, height 5\' 7"  (1.702 m), weight 69.945 kg (154 lb 3.2 oz), SpO2 97 %.  PHYSICAL EXAMINATION:   Physical Exam  GENERAL:  74 y.o.-year-Sonya patient lying in the bed with no acute distress.  EYES: Pupils equal, round, reactive to light and accommodation. No scleral icterus. Extraocular muscles intact.  HEENT: Head atraumatic, normocephalic. Oropharynx and nasopharynx clear.  NECK:  Supple, no jugular venous distention. No thyroid enlargement, no tenderness.  LUNGS: Normal breath sounds bilaterally, no wheezing, rales, rhonchi. No use of accessory muscles of respiration.  CARDIOVASCULAR: S1, S2 normal. No murmurs, rubs, or gallops.  ABDOMEN:  Soft, nontender, nondistended. Bowel sounds present. No organomegaly or mass.  EXTREMITIES: No cyanosis, clubbing or edema b/l.    NEUROLOGIC: Cranial nerves II through XII are intact. No focal Motor or sensory deficits b/l.   PSYCHIATRIC: The patient is alert and oriented x 3.  SKIN: No obvious rash, lesion, or ulcer.    LABORATORY PANEL:   CBC  Recent Labs Lab 09/17/14 0141  WBC 7.3  HGB 12.8  HCT 39.1  PLT 168   ------------------------------------------------------------------------------------------------------------------  Chemistries   Recent Labs Lab 09/17/14 0141  NA 141  K 3.4*  CL 103  CO2 26  GLUCOSE 100*  BUN 10  CREATININE 0.52  CALCIUM 8.1*  AST 45*  ALT 27  ALKPHOS 63  BILITOT 0.8   ------------------------------------------------------------------------------------------------------------------  Cardiac Enzymes No results for input(s): TROPONINI in the last 168 hours. ------------------------------------------------------------------------------------------------------------------  RADIOLOGY:  Dg Lumbar Spine 2-3 Views  09/17/2014   CLINICAL DATA:  Status post fall, with groin pain and hip pain. Initial encounter.  EXAM: LUMBAR SPINE - 2-3 VIEW  COMPARISON:  CT of the abdomen and pelvis from 01/17/2011  FINDINGS: There is slight cortical irregularity along the left side of the superior endplate of L3, new from 2012. Would correlate for any associated symptoms, to exclude an underlying small endplate fracture. This could be chronic in nature. Vertebral bodies demonstrate normal alignment. Intervertebral disc spaces are preserved.  The visualized bowel gas pattern is unremarkable in appearance; air and stool are noted within the colon. The sacroiliac joints are within normal limits.  IMPRESSION: Slight cortical irregularity along the left side of the superior endplate of L3, new from 2012. Would correlate for any associated symptoms, to exclude an  underlying  small template fracture. This could be chronic in nature.   Electronically Signed   By: Garald Balding M.D.   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 Concern for head injury. Initial encounter.  EXAM: CT HEAD WITHOUT CONTRAST  TECHNIQUE: Contiguous axial images were obtained from the base of the skull through the vertex without intravenous contrast.  COMPARISON:  CT of the head performed 08/05/2013  FINDINGS: There is no evidence of acute infarction, mass lesion, or intra- or extra-axial hemorrhage on CT.  Prominence of the ventricles and sulci reflects mild cortical volume loss. Mild cerebellar atrophy is noted. Scattered periventricular and subcortical white matter change likely reflects small vessel ischemic microangiopathy.  The brainstem and fourth ventricle are within normal limits. The basal ganglia are unremarkable in appearance. The cerebral hemispheres demonstrate grossly normal gray-white differentiation. No mass effect or midline shift is seen.  There is no evidence of fracture; visualized osseous structures are unremarkable in appearance. The visualized portions of the orbits are within normal limits. The paranasal sinuses and mastoid air cells are well-aerated. No significant soft tissue abnormalities are seen.  IMPRESSION: 1. No evidence of traumatic intracranial injury or fracture. 2. Mild cortical volume loss and scattered small vessel ischemic microangiopathy.   Electronically Signed   By: Garald Balding M.D.   On: 09/17/2014 01:16   Ct Lumbar Spine Wo Contrast  09/17/2014   CLINICAL DATA:  Right groin pain and unable to bear weight on right lower extremity after falling when stepping in a hole.  EXAM: CT LUMBAR SPINE WITHOUT CONTRAST  TECHNIQUE: Multidetector CT imaging of the lumbar spine was performed without intravenous contrast administration. Multiplanar CT image reconstructions were also generated.   COMPARISON:  Radiographs 09/17/2014  FINDINGS: There is an acute fracture involving the left superior aspect of the L3 vertebral body with mild loss of height of approximately 6 mm. Pedicles are intact. Posterior elements are intact. Facet articulations are intact. Remainder of the lumbar vertebrae are intact.  There is no significant paraspinal hematoma.  There are mild circumferential disc bulge is at all of the lumbar levels, without significant central canal stenosis.  IMPRESSION: Acute fracture of the left superior aspect of the L3 vertebral body with superior endplate impaction by about 6 mm. This is a stable fracture.   Electronically Signed   By: Andreas Newport M.D.   On: 09/17/2014 03:24   Dg Hip Unilat With Pelvis 2-3 Views Right  09/17/2014   CLINICAL DATA:  Acute onset of right hip pain, status post fall. Initial encounter.  EXAM: RIGHT HIP (WITH PELVIS) 2-3 VIEWS  COMPARISON:  None.  FINDINGS: There is mild cortical irregularity along the right inferior pubic ramus, which could reflect a minimally displaced fracture. No additional fractures are seen.  Both femoral heads are seated normally within their respective acetabula. The proximal right femur appears intact. Mild degenerative change is noted at the lower lumbar spine. The sacroiliac joints are unremarkable in appearance.  The visualized bowel gas pattern is grossly unremarkable in appearance. Scattered phleboliths are noted within the pelvis.  IMPRESSION: Mild cortical irregularity along the right inferior pubic ramus could reflect a minimally displaced fracture.   Electronically Signed   By: Garald Balding M.D.   On: 09/17/2014 01:35     ASSESSMENT AND PLAN:   1. Fracture right inferior pubic ramus following mechanical fall. -seen by Dr Rudene Christians. Recommends PT and pain  meds  2. Compression fracture L3 vertebra following mechanical fall. -cont pain meds  3. Hypokalemia, mild.  4.PT recommend HHPT Management plans discussed with  the patient, family and they are in agreement.  CODE STATUS: Full  DVT Prophylaxis: heparin  TOTAL TIME TAKING CARE OF THIS PATIENT: 6minutes.   POSSIBLE D/C IN 1 DAYS, DEPENDING ON CLINICAL CONDITION.   Grae Cannata M.D on 09/17/2014 at 1:39 PM  Between 7am to 6pm - Pager - [phone]  After 6pm go to [email or web address] - password [email or web address] [facility]  Office  [phone]  CC: Primary care physician; Cletis Athens, MD

## 2014-09-17 NOTE — Care Management Note (Signed)
Case Management Note  Patient Details  Name: Sonya Park MRN: [id number] Date of Birth: 10/10/1940  Subjective/Objective:                    Action/Plan:   Expected Discharge Date:                  Expected Discharge Plan:     In-House Referral:     Discharge planning Services     Post Acute Care Choice:    Choice offered to:     DME Arranged:    DME Agency:     HH Arranged:    [place] Agency:     Status of Service:     Medicare Important Message Given:    Date Medicare IM Given:    Medicare IM give by:    Date Additional Medicare IM Given:    Additional Medicare Important Message give by:     If discussed at [place] of Stay Meetings, dates discussed:    Additional Comments: Case reviewed with Dr.Sparks initial patient class was inpatient did not meet  Criteria changed to observation. Medicare Observation status notification delivered to patient / signed/verbalized understanding and placed on chart with Care Management note/code 44. Case closed.  Ival Bible, RN 09/17/2014, 4:13 PM

## 2014-09-17 NOTE — Progress Notes (Signed)
Husband at bedside working on computer together

## 2014-09-17 NOTE — Evaluation (Signed)
Physical Therapy Evaluation Patient Details Name: Sonya Park MRN: [id number] DOB: [birth year] Today's Date: 09/17/2014   History of Present Illness  74 yo female with onset of pubic ramus fracture due to sitting down hard at a ditch was admitted and noted L3 compression fracture and possibly an issue with L4.    Clinical Impression  Pt was seen for evaluation of mobiltiy after a fall with fractures to spine an pelvis. Pt is hoping to get home with husband and will need to try a step to ensure her success.  Pt is very motivated and willing to try with flat step due to no rails at home.  Will focus on increasing gait and balance work as pt progresses to home    Follow Up Recommendations Home health PT;Supervision/Assistance - 24 hour    Equipment Recommendations  Rolling walker with 5" wheels (unless hers is functional)    Recommendations for Other Services       Precautions / Restrictions Precautions Precautions: Fall;Back Precaution Booklet Issued: No Restrictions Weight Bearing Restrictions: No      Mobility  Bed Mobility Overal bed mobility: Needs Assistance Bed Mobility: Supine to Sit     Supine to sit: Min assist     General bed mobility comments: pt using minimal help to scoot out to edge of bed and had cues for sequencing rolling to side for back safety  Transfers Overall transfer level: Needs assistance Equipment used: Rolling walker (2 wheeled) Transfers: Sit to/from [facility] Sit to Stand: Min guard;Min assist Stand pivot transfers: Min guard       General transfer comment: limited help to power up the first 2 trials then could stand without help  Ambulation/Gait Ambulation/Gait assistance: Min guard Ambulation Distance (Feet): 150 Feet Assistive device: Rolling walker (2 wheeled) Gait Pattern/deviations: Step-through pattern;Decreased step length - right;Decreased step length - left;Decreased dorsiflexion - right;Decreased  dorsiflexion - left;Wide base of support;Antalgic Gait velocity: reduced Gait velocity interpretation: Below normal speed for age/gender    Stairs            Wheelchair Mobility    Modified Rankin (Stroke Patients Only)       Balance Overall balance assessment: Needs assistance Sitting-balance support: Feet supported Sitting balance-Leahy Scale: Good     Standing balance support: Bilateral upper extremity supported Standing balance-Leahy Scale: Fair Standing balance comment: fair- dynamic support                             Pertinent Vitals/Pain Pain Assessment: 0-10 Pain Score: 5  Pain Location: groin Pain Intervention(s): Limited activity within patient's tolerance;Monitored during session;Premedicated before session;Repositioned    Home Living Family/patient expects to be discharged to:: Private residence Living Arrangements: Spouse/significant other Available Help at Discharge: Family Type of Home: House Home Access: Stairs to enter Entrance Stairs-Rails: None Entrance Stairs-Number of Steps: 1 Home Layout: Two level;Able to live on main level with bedroom/bathroom Home Equipment: Gilford Rile - 2 wheels      Prior Function Level of Independence: Independent               Hand Dominance        Extremity/Trunk Assessment   Upper Extremity Assessment: Overall WFL for tasks assessed           Lower Extremity Assessment: Overall WFL for tasks assessed      Cervical / Trunk Assessment: Normal  Communication   Communication: No difficulties  Cognition Arousal/Alertness: Awake/alert  Behavior During Therapy: WFL for tasks assessed/performed Overall Cognitive Status: Within Functional Limits for tasks assessed                      General Comments General comments (skin integrity, edema, etc.): Pt is planning to go home from hospital and was able to get OOB and to chair with minor assistance.  Planning to take her on steps  tomorrow to ensure a safe transition to home.    Exercises        Assessment/Plan    PT Assessment Patient needs continued PT services  PT Diagnosis Difficulty walking;Acute pain   PT Problem List Decreased range of motion;Decreased activity tolerance;Decreased balance;Decreased mobility;Decreased coordination;Decreased knowledge of use of DME;Decreased strength;Pain  PT Treatment Interventions DME instruction;Gait training;Stair training;Functional mobility training;Therapeutic activities;Therapeutic exercise;Neuromuscular re-education;Balance training;Patient/family education   PT Goals (Current goals can be found in the Care Plan section) Acute Rehab PT Goals Patient Stated Goal: to get home and travel in a month PT Goal Formulation: With patient Time For Goal Achievement: 10/01/14 Potential to Achieve Goals: Good    Frequency 7X/week   Barriers to discharge Inaccessible home environment Needs to climb step to enter    Co-evaluation               End of Session Equipment Utilized During Treatment: Gait belt Activity Tolerance: Patient tolerated treatment well;No increased pain Patient left: in chair;with call bell/phone within reach;with chair alarm set Nurse Communication: Mobility status         Time: 7588-3254 PT Time Calculation (min) (ACUTE ONLY): 27 min   Charges:   PT Evaluation $Initial PT Evaluation Tier I: 1 Procedure PT Treatments $Gait Training: 8-22 mins   PT G Codes:        Ramond Dial 09/20/2014, 12:57 PM   Mee Hives, PT MS Acute Rehab Dept. Number: [facility] [id number] and [place] [phone]

## 2014-09-17 NOTE — ED Notes (Signed)
Admitting MD at bedside.

## 2014-09-17 NOTE — Consult Note (Signed)
Consult regarding L3 compression fracture and right-sided pubic ramus fracture. History of present illness: Patient is a active 74 year old who was pressure washing her house windows and then her pickup truck she took a step back and there was a low-lying drainage ditch when she stumbled and fell back onto her buttocks. She denies prodromal symptoms and no chronic back issues she does has a history of osteoarthritis and has had knee replacement. 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 The fall occurred at home  Past history was reviewed including significant history of knee replacement.  Examination there is no clonus to physical examination lower extremities she has intact sensation to the legs. Muscle strength appears intact and normal. She does not have pain with logrolling of the hip, but does have point tenderness to the groin on the right side. No pain with lateral compression of the pelvis. Regarding her back she does have point tenderness at the L3 spinous process with mild tenderness in the paraspinous muscles adjacent to this with no ecchymosis. She is nontender above and below this level to percussion.  X-rays were reviewed along with CT lumbar spine. Agree that there is a nondisplaced pubic ramus fracture on the right and superior endplate compression L3.  Clinical impression is L3 compression fracture acute, right-sided pubic ramus fracture acute.   Recommendation: Physical therapy to try to mobilize patient with the aid of a walker. If she has increasing hip pain may need to get MRI to rule out nondisplaced femoral neck fracture. If her back pain is intolerable and is non-responsive to and other means and ligated MRI and make sure there is not a compression fracture and adjacent level that might need kyphoplasty. My expectation is that she will slowly improve and not require any intervention other than pain medication. she will be follow-up with me in 2  weeks if she is able to get leave the hospital after having therapy

## 2014-09-17 NOTE — ED Notes (Signed)
Pt with xray. Family in room.

## 2014-09-17 NOTE — ED Notes (Signed)
In with MD to discuss CT results. Pt needed 2 person assist out of bed and 1 person assist to ambulate to restroom.

## 2014-09-17 NOTE — Progress Notes (Signed)
Pt is one assist with walker to bathroom tol well, requires pain med q 4 hours with relief , rounds by Dr Posey Pronto and seen by ortho Dr Rudene Christians  And physical therapy

## 2014-09-17 NOTE — ED Provider Notes (Addendum)
[facility] Emergency Department Provider Note  ____________________________________________  Time seen: On arrival  I have reviewed the triage vital signs and the nursing notes.   HISTORY  Chief Complaint Fall; Groin Pain; and Hip Pain      HPI Sonya Park is a 74 y.o. female who presents after a fall approximately 6 hours ago today. She reports she was washing her car stepped in a hole and fell backwards. She was helped up by her husband and spent the afternoon on the couch but has significant pain which is sharp in her right groin with any attempts to move her legs. She also complains of lower back pain. She did hit her head when she fell but denies neuro deficits. She denies headache    PMH Hypertension   There are no active problems to display for this patient.   Past Surgical History  Procedure Laterality Date  . Abdominal hysterectomy    . Replacement total knee    . Gastric bypass      X2  . Hemoroid    . Cosmetic surgery    . Knee arthroscopy      Current Outpatient Rx  Name  Route  Sig  Dispense  Refill  . ALPRAZolam (XANAX) 0.5 MG tablet   Oral   Take 0.5 mg by mouth at bedtime as needed for anxiety.         Marland Kitchen aspirin 81 MG tablet   Oral   Take 81 mg by mouth daily.         Marland Kitchen BIOTIN PO   Oral   Take 1 capsule by mouth daily.         Marland Kitchen CALCIUM CARBONATE PO   Oral   Take 1 tablet by mouth 2 (two) times daily.         . cholecalciferol (VITAMIN D) 1000 UNITS tablet   Oral   Take 1,000 Units by mouth daily.         . DiphenhydrAMINE HCl (ZZZQUIL) 50 MG/30ML LIQD   Oral   Take 30 mLs by mouth at bedtime as needed (for sleep).         Marland Kitchen estrogens, conjugated, (PREMARIN) 0.625 MG tablet   Oral   Take 0.625 mg by mouth daily. Take daily for 21 days then do not take for 7 days.         Marland Kitchen ibandronate (BONIVA) 150 MG tablet   Oral   Take 150 mg by mouth every 30 (thirty) days. Take in the morning with a  full glass of water, on an empty stomach, and do not take anything else by mouth or lie down for the next 30 min.         . Multiple Vitamins-Minerals (MULTIVITAMIN PO)   Oral   Take 1 tablet by mouth 2 (two) times daily.         . Probiotic Product (PROBIOTIC PO)   Oral   Take 1 capsule by mouth daily.           Allergies Penicillins  No family history on file.  Social History History  Substance Use Topics  . Smoking status: Former Research scientist (life sciences)  . Smokeless tobacco: Never Used  . Alcohol Use: 1.8 oz/week    3 Glasses of wine per week    Review of Systems  Constitutional: Negative for fever. Eyes: Negative for visual changes. ENT: Negative for sore throat Cardiovascular: Negative for chest pain. Respiratory: Negative for shortness of breath. Gastrointestinal: Negative  for abdominal pain, vomiting and diarrhea. Genitourinary: Negative for dysuria. Musculoskeletal: Positive for back pain and right groin pain Skin: Negative for rash. Neurological: Negative for headaches or focal weakness   10-point ROS otherwise negative.  ____________________________________________   PHYSICAL EXAM:  VITAL SIGNS: ED Triage Vitals  Enc Vitals Group     BP 09/17/14 0003 151/88 mmHg     Pulse Rate 09/17/14 0003 87     Resp 09/17/14 0003 20     Temp 09/17/14 0003 98 F (36.7 C)     Temp Source 09/17/14 0003 Oral     SpO2 09/17/14 0003 97 %     Weight 09/17/14 0003 156 lb (70.761 kg)     Height 09/17/14 0003 5\' 7"  (1.702 m)     Head Cir --      Peak Flow --      Pain Score 09/17/14 0004 5     Pain Loc --      Pain Edu? --      Excl. in [place]? --      Constitutional: Alert and oriented. Well appearing and in no distress. Eyes: Conjunctivae are normal. PERRL. ENT   Head: Normocephalic and atraumatic.   Nose: No rhinnorhea.   Mouth/Throat: Mucous membranes are moist. Cardiovascular: Normal rate, regular rhythm. Normal and symmetric distal pulses are present in all  extremities. No murmurs, rubs, or gallops. Respiratory: Normal respiratory effort without tachypnea nor retractions. Breath sounds are clear and equal bilaterally.  Gastrointestinal: Soft and non-tender in all quadrants. No distention. There is no CVA tenderness. Genitourinary: deferred Musculoskeletal: Pain in the right groin while attempting to lift her right leg off the bed. Pain does not seem worse with axial pressure. 2+ pulses distally. No tenderness to palpation of her pelvis. She does seem to have lumbar spine tenderness to palpation approximately L4.  Neurologic:  Normal speech and language. No gross focal neurologic deficits are appreciated. Skin:  Skin is warm, dry and intact. No rash noted. Psychiatric: Mood and affect are normal. Patient exhibits appropriate insight and judgment.  ____________________________________________    LABS (pertinent positives/negatives)  Labs Reviewed  COMPREHENSIVE METABOLIC PANEL - Abnormal; Notable for the following:    Potassium 3.4 (*)    Glucose, Bld 100 (*)    Calcium 8.1 (*)    Total Protein 5.7 (*)    Albumin 3.4 (*)    AST 45 (*)    All other components within normal limits  CBC  APTT  PROTIME-INR    ____________________________________________   EKG  ED ECG REPORT I, Lavonia Drafts, the attending physician, personally viewed and interpreted this ECG.  Date: 09/17/2014  Rate: 82 Rhythm: normal sinus rhythm QRS Axis: normal Intervals: normal ST/T Wave abnormalities: normal Conduction Disutrbances: none Narrative Interpretation: unremarkable   ____________________________________________    RADIOLOGY  Lumbar spine concerning for L3 endplate fracture, x-ray pelvis concerning for inferior ramus fracture, CT head negative  ____________________________________________   PROCEDURES  Procedure(s) performed: none  Critical Care performed: none  ____________________________________________   INITIAL IMPRESSION  / ASSESSMENT AND PLAN / ED COURSE  Pertinent labs & imaging results that were available during my care of the patient were reviewed by me and considered in my medical decision making (see chart for details).  Given location of pain we will x-ray patient pelvis right hip and lumbar spine as well as obtain a CT of the head  ----------------------------------------- 3:45 AM on 09/17/2014 -----------------------------------------  Patient with significant difficulty walking secondary to pain. I think  she will require admission to the hospital for pain control ____________________________________________   FINAL CLINICAL IMPRESSION(S) / ED DIAGNOSES  Final diagnoses:  Fall with injury  Pubic ramus fracture, right, closed, initial encounter  Lumbar compression fracture, closed, initial encounter     Lavonia Drafts, MD 09/17/14 0344  Lavonia Drafts, MD 09/17/14 0345  Lavonia Drafts, MD 09/17/14 [phone]

## 2014-09-18 DIAGNOSIS — E876 Hypokalemia: Secondary | ICD-10-CM | POA: Diagnosis not present

## 2014-09-18 DIAGNOSIS — S32030A Wedge compression fracture of third lumbar vertebra, initial encounter for closed fracture: Secondary | ICD-10-CM | POA: Diagnosis not present

## 2014-09-18 DIAGNOSIS — S32591A Other specified fracture of right pubis, initial encounter for closed fracture: Secondary | ICD-10-CM | POA: Diagnosis not present

## 2014-09-18 DIAGNOSIS — S32039A Unspecified fracture of third lumbar vertebra, initial encounter for closed fracture: Secondary | ICD-10-CM | POA: Diagnosis not present

## 2014-09-18 MED ORDER — ONDANSETRON HCL 4 MG PO TABS: 4.0000 mg | ORAL_TABLET | Freq: Four times a day (QID) | ORAL | Status: DC | PRN

## 2014-09-18 MED ORDER — HYDROCODONE-ACETAMINOPHEN 5-325 MG PO TABS: 1.0000 | ORAL_TABLET | ORAL | Status: DC | PRN

## 2014-09-18 NOTE — Progress Notes (Signed)
Patient walking with stand by and walker use . Husband in room . Patient given nausea med iv  No vomiting .Antianxiety med given with good result. Patient sleeping with cpap  in progress.

## 2014-09-18 NOTE — Discharge Summary (Signed)
[facility] NAME: Sonya Park    MR#:  [id number]  DATE OF BIRTH:  May 17, 1940  DATE OF ADMISSION:  09/16/2014 ADMITTING PHYSICIAN: Juluis Mire, MD  DATE OF DISCHARGE: 09/18/2014  PRIMARY CARE PHYSICIAN: Cletis Athens, MD    ADMISSION DIAGNOSIS:  Fall with injury [T14.90, W19.XXXA] Lumbar compression fracture, closed, initial encounter [S32.000A] Pubic ramus fracture, right, closed, initial encounter [S32.501A]  DISCHARGE DIAGNOSIS:  Right pubic fracture s/p mechanical fall L3 vertebral compression fracture SECONDARY DIAGNOSIS:   Past Medical History  Diagnosis Date  . Medical history non-contributory     HOSPITAL COURSE:   1. Fracture right inferior pubic ramus following mechanical fall. -seen by Dr Rudene Christians. Recommends PT and pain meds  2. Compression fracture L3 vertebra following mechanical fall. -cont pain meds  3. Hypokalemia, mild.  4.PT recommend HHPT -will get pt to do another session today. OK to go home if she is able to manage to work well with PT  DISCHARGE CONDITIONS:   fair  CONSULTS OBTAINED:  Treatment Team:  Hessie Knows, MD  DRUG ALLERGIES:   Allergies  Allergen Reactions  . Penicillins Swelling    DISCHARGE MEDICATIONS:   Current Discharge Medication List    START taking these medications   Details  HYDROcodone-acetaminophen (NORCO/VICODIN) 5-325 MG per tablet Take 1-2 tablets by mouth every 4 (four) hours as needed for moderate pain. Qty: 30 tablet, Refills: 0    ondansetron (ZOFRAN) 4 MG tablet Take 1 tablet (4 mg total) by mouth every 6 (six) hours as needed for nausea. Qty: 20 tablet, Refills: 0      CONTINUE these medications which have NOT CHANGED   Details  ALPRAZolam (XANAX) 0.5 MG tablet Take 0.5 mg by mouth at bedtime as needed for anxiety.    aspirin 81 MG tablet Take 81 mg by mouth daily.    BIOTIN PO Take 1 capsule by mouth daily.    CALCIUM  CARBONATE PO Take 1 tablet by mouth 2 (two) times daily.    cholecalciferol (VITAMIN D) 1000 UNITS tablet Take 1,000 Units by mouth daily.    DiphenhydrAMINE HCl (ZZZQUIL) 50 MG/30ML LIQD Take 30 mLs by mouth at bedtime as needed (for sleep).    estrogens, conjugated, (PREMARIN) 0.625 MG tablet Take 0.625 mg by mouth daily. Take daily for 21 days then do not take for 7 days.    ibandronate (BONIVA) 150 MG tablet Take 150 mg by mouth every 30 (thirty) days. Take in the morning with a full glass of water, on an empty stomach, and do not take anything else by mouth or lie down for the next 30 min.    Multiple Vitamins-Minerals (MULTIVITAMIN PO) Take 1 tablet by mouth 2 (two) times daily.    Probiotic Product (PROBIOTIC PO) Take 1 capsule by mouth daily.       If you experience worsening of your admission symptoms, develop shortness of breath, life threatening emergency, suicidal or homicidal thoughts you must seek medical attention immediately by calling 911 or calling your MD immediately  if symptoms less severe.  You Must read complete instructions/literature along with all the possible adverse reactions/side effects for all the Medicines you take and that have been prescribed to you. Take any new Medicines after you have completely understood and accept all the possible adverse reactions/side effects.   Please note  You were cared for by a hospitalist during your hospital stay. If you have any questions about your  discharge medications or the care you received while you were in the hospital after you are discharged, you can call the unit and asked to speak with the hospitalist on call if the hospitalist that took care of you is not available. Once you are discharged, your primary care physician will handle any further medical issues. Please note that NO REFILLS for any discharge medications will be authorized once you are discharged, as it is imperative that you return to your primary care  physician (or establish a relationship with a primary care physician if you do not have one) for your aftercare needs so that they can reassess your need for medications and monitor your lab values. Today   SUBJECTIVE   Doing well. Wants to take shower  VITAL SIGNS:  Blood pressure 152/80, pulse 65, temperature 98 F (36.7 C), temperature source Oral, resp. rate 16, height 5\' 7"  (1.702 m), weight 73.256 kg (161 lb 8 oz), SpO2 97 %.  I/O:   Intake/Output Summary (Last 24 hours) at 09/18/14 1043 Last data filed at 09/18/14 0917  Gross per 24 hour  Intake   1030 ml  Output    205 ml  Net    825 ml    PHYSICAL EXAMINATION:  GENERAL:  74 y.o.-year-old patient lying in the bed with no acute distress.  EYES: Pupils equal, round, reactive to light and accommodation. No scleral icterus. Extraocular muscles intact.  HEENT: Head atraumatic, normocephalic. Oropharynx and nasopharynx clear.  NECK:  Supple, no jugular venous distention. No thyroid enlargement, no tenderness.  LUNGS: Normal breath sounds bilaterally, no wheezing, rales,rhonchi or crepitation. No use of accessory muscles of respiration.  CARDIOVASCULAR: S1, S2 normal. No murmurs, rubs, or gallops.  ABDOMEN: Soft, non-tender, non-distended. Bowel sounds present. No organomegaly or mass.  EXTREMITIES: No pedal edema, cyanosis, or clubbing.  NEUROLOGIC: Cranial nerves II through XII are intact. Muscle strength 5/5 in all extremities. Sensation intact. Gait not checked.  PSYCHIATRIC: The patient is alert and oriented x 3.  SKIN: No obvious rash, lesion, or ulcer.   DATA REVIEW:   CBC   Recent Labs Lab 09/17/14 0141  WBC 7.3  HGB 12.8  HCT 39.1  PLT 168    Chemistries   Recent Labs Lab 09/17/14 0141  NA 141  K 3.4*  CL 103  CO2 26  GLUCOSE 100*  BUN 10  CREATININE 0.52  CALCIUM 8.1*  AST 45*  ALT 27  ALKPHOS 63  BILITOT 0.8    RADIOLOGY:  Dg Lumbar Spine 2-3 Views  09/17/2014   CLINICAL DATA:  Status  post fall, with groin pain and hip pain. Initial encounter.  EXAM: LUMBAR SPINE - 2-3 VIEW  COMPARISON:  CT of the abdomen and pelvis from 01/17/2011  FINDINGS: There is slight cortical irregularity along the left side of the superior endplate of L3, new from 2012. Would correlate for any associated symptoms, to exclude an underlying small endplate fracture. This could be chronic in nature. Vertebral bodies demonstrate normal alignment. Intervertebral disc spaces are preserved.  The visualized bowel gas pattern is unremarkable in appearance; air and stool are noted within the colon. The sacroiliac joints are within normal limits.  IMPRESSION: Slight cortical irregularity along the left side of the superior endplate of L3, new from 2012. Would correlate for any associated symptoms, to exclude an underlying small template fracture. This could be chronic in nature.   Electronically Signed   By: Garald Balding M.D.   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 Concern for head injury. Initial encounter.  EXAM: CT HEAD WITHOUT CONTRAST  TECHNIQUE: Contiguous axial images were obtained from the base of the skull through the vertex without intravenous contrast.  COMPARISON:  CT of the head performed 08/05/2013  FINDINGS: There is no evidence of acute infarction, mass lesion, or intra- or extra-axial hemorrhage on CT.  Prominence of the ventricles and sulci reflects mild cortical volume loss. Mild cerebellar atrophy is noted. Scattered periventricular and subcortical white matter change likely reflects small vessel ischemic microangiopathy.  The brainstem and fourth ventricle are within normal limits. The basal ganglia are unremarkable in appearance. The cerebral hemispheres demonstrate grossly normal gray-white differentiation. No mass effect or midline shift is seen.  There is no evidence of fracture; visualized osseous structures are unremarkable in  appearance. The visualized portions of the orbits are within normal limits. The paranasal sinuses and mastoid air cells are well-aerated. No significant soft tissue abnormalities are seen.  IMPRESSION: 1. No evidence of traumatic intracranial injury or fracture. 2. Mild cortical volume loss and scattered small vessel ischemic microangiopathy.   Electronically Signed   By: Garald Balding M.D.   On: 09/17/2014 01:16   Ct Lumbar Spine Wo Contrast  09/17/2014   CLINICAL DATA:  Right groin pain and unable to bear weight on right lower extremity after falling when stepping in a hole.  EXAM: CT LUMBAR SPINE WITHOUT CONTRAST  TECHNIQUE: Multidetector CT imaging of the lumbar spine was performed without intravenous contrast administration. Multiplanar CT image reconstructions were also generated.  COMPARISON:  Radiographs 09/17/2014  FINDINGS: There is an acute fracture involving the left superior aspect of the L3 vertebral body with mild loss of height of approximately 6 mm. Pedicles are intact. Posterior elements are intact. Facet articulations are intact. Remainder of the lumbar vertebrae are intact.  There is no significant paraspinal hematoma.  There are mild circumferential disc bulge is at all of the lumbar levels, without significant central canal stenosis.  IMPRESSION: Acute fracture of the left superior aspect of the L3 vertebral body with superior endplate impaction by about 6 mm. This is a stable fracture.   Electronically Signed   By: Andreas Newport M.D.   On: 09/17/2014 03:24   Dg Hip Unilat With Pelvis 2-3 Views Right  09/17/2014   CLINICAL DATA:  Acute onset of right hip pain, status post fall. Initial encounter.  EXAM: RIGHT HIP (WITH PELVIS) 2-3 VIEWS  COMPARISON:  None.  FINDINGS: There is mild cortical irregularity along the right inferior pubic ramus, which could reflect a minimally displaced fracture. No additional fractures are seen.  Both femoral heads are seated normally within their  respective acetabula. The proximal right femur appears intact. Mild degenerative change is noted at the lower lumbar spine. The sacroiliac joints are unremarkable in appearance.  The visualized bowel gas pattern is grossly unremarkable in appearance. Scattered phleboliths are noted within the pelvis.  IMPRESSION: Mild cortical irregularity along the right inferior pubic ramus could reflect a minimally displaced fracture.   Electronically Signed   By: Garald Balding M.D.   On: 09/17/2014 01:35     Management plans discussed with the patient, family and they are in agreement.  CODE STATUS:     Code Status Orders        Start     Ordered   09/17/14 0602  Full code   Continuous  09/17/14 0601    Advance Directive Documentation        Most Recent Value   Type of Advance Directive  -- [pt states "a regular will"]   Pre-existing out of facility DNR order (yellow form or pink MOST form)     "MOST" Form in Place?        TOTAL TIME TAKING CARE OF THIS PATIENT: 40 minutes.    Ashwath Lasch M.D on 09/18/2014 at 10:43 AM  Between 7am to 6pm - Pager - [phone] After 6pm go to [email or web address] - password [email or web address] [facility]  Office  [phone]  CC: Primary care physician; Cletis Athens, MD

## 2014-09-18 NOTE — Discharge Instructions (Signed)
[place] as instructed

## 2014-09-18 NOTE — Care Management Note (Signed)
Case Management Note  Patient Details  Name: Sonya Park MRN: [id number] Date of Birth: 10-Apr-1940  Subjective/Objective:        Referral called and faxed to [facility] with Ms Mcnellis's consent per Mrs Uplinger has a [place] home address.  New home health PT. Ms Azizi reports that she has a front wheeled rolling walker and all other DME equipment at home.                Expected Discharge Date:                  Expected Discharge Plan:     In-House Referral:     Discharge planning Services     Post Acute Care Choice:    Choice offered to:     DME Arranged:    DME Agency:     HH Arranged:    [place] Agency:     Status of Service:     Medicare Important Message Given:    Date Medicare IM Given:    Medicare IM give by:    Date Additional Medicare IM Given:    Additional Medicare Important Message give by:     If discussed at [place] of Stay Meetings, dates discussed:    Additional Comments:  Erbie Arment A, RN 09/18/2014, 11:07 AM

## 2014-09-18 NOTE — Progress Notes (Signed)
Physical Therapy Treatment Patient Details Name: Sonya Park MRN: [id number] DOB: Aug 24, 1940 Today's Date: 09/18/2014    History of Present Illness 74 yo female with onset of pubic ramus fracture due to sitting down hard at a ditch was admitted and noted L3 compression fracture and possibly an issue with L4.      PT Comments    **Patient was able to tolerate increased ambulation distance well without complaint other than moderate pain in her low back. Mild cueing was needed initially for transfers for safety; however, by the end of treatment, patient was performing mobility tasks safely without assistance. One step stair negotiation was performed safely for 2 trials. *  Follow Up Recommendations  Home health PT     Equipment Recommendations       Recommendations for Other Services       Precautions / Restrictions Precautions Precautions: Fall;Back Restrictions Weight Bearing Restrictions: No    Mobility  Bed Mobility Overal bed mobility: Independent Bed Mobility: Supine to Sit;Sit to Supine     Supine to sit: Modified independent (Device/Increase time) (with increased time) Sit to supine: Independent      Transfers Overall transfer level: Needs assistance Equipment used: Rolling walker (2 wheeled) Transfers: Sit to/from Stand Sit to Stand: Min guard (and independent with stand to sit at treatment end)         General transfer comment: Cueing to remind patient to push up from sitting surface was needed at the begiinning of treatment; no cueing needed by the end of treatment and patient was independent and safe with stand to sit  Ambulation/Gait Ambulation/Gait assistance: Min guard Ambulation Distance (Feet): 240 Feet Assistive device: Rolling walker (2 wheeled) Gait Pattern/deviations: Step-through pattern Gait velocity: reduced       Stairs Stairs: Yes Stairs assistance: Min guard (for 1st trial and stand by assist for 2nd trial) Stair Management: No  rails;Backwards;With walker Number of Stairs: 1 General stair comments: Pt. 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 was safe with no loss of balance noted.  Wheelchair Mobility    Modified Rankin (Stroke Patients Only)       Balance                                    Cognition Arousal/Alertness: Awake/alert Behavior During Therapy: WFL for tasks assessed/performed Overall Cognitive Status: Within Functional Limits for tasks assessed                      Exercises General Exercises - Lower Extremity Quad Sets: 10 reps;Right;Left Gluteal Sets: 10 reps Hip ABduction/ADduction: Right;Left;10 reps    General Comments        Pertinent Vitals/Pain Pain Assessment:  (moderate) Pain Location: groin and low back (with walking) Pain Intervention(s): Limited activity within patient's tolerance (pt. reports that she had pain medication prior to treatment)    Home Living                      Prior Function            PT Goals (current goals can now be found in the care plan section) Acute Rehab PT Goals Patient Stated Goal: to go home today Progress towards PT goals: Progressing toward goals  Frequency  7X/week    PT Plan      Co-evaluation             End of Session Equipment Utilized During Treatment: Gait belt Activity Tolerance: Patient tolerated treatment well Patient left: in bed;with bed alarm set     Time: 3254-9826 PT Time Calculation (min) (ACUTE ONLY): 25 min  Charges:  $Gait Training: 8-22 mins $Therapeutic Exercise: 8-22 mins                    G CodesBertram Denver, PT 09/18/2014, 11:39 AM

## 2014-09-18 NOTE — Progress Notes (Signed)
Patient reports sleeping well last night. She is having pain in the lower buttock in the midline, probably a sacral bruise from her fall. She appears to be moving around in bed with less discomfort today although she just recently had a pain pill.   She'll be getting up with physical therapy today she do not do very well yesterday but hopefully will show improvement and then determine if she needs to go to rehabilitation or home

## 2014-09-20 NOTE — Care Management (Addendum)
Post discharge: Received call from from Dr. Rudene Christians office stating that patient had called their office stating no home health has visited for physical therapy. Attempted to reach liaison with [place] Marian Sorrow but she is out of the office. [place]. Her start of care will be today. Patient notified.Patient starts she plans to ride with her husband to MD appointment in [facility] today- appointment is for husband not patient. She is aware that Janeece Riggers will attempt to visit with her today and that she will need to work out a schedule with Janeece Riggers- she agrees. Case closed.

## 2014-09-28 DIAGNOSIS — S32020D Wedge compression fracture of second lumbar vertebra, subsequent encounter for fracture with routine healing: Secondary | ICD-10-CM | POA: Diagnosis not present

## 2014-09-28 DIAGNOSIS — M545 Low back pain: Secondary | ICD-10-CM | POA: Diagnosis not present

## 2014-09-28 DIAGNOSIS — S32501D Unspecified fracture of right pubis, subsequent encounter for fracture with routine healing: Secondary | ICD-10-CM | POA: Diagnosis not present

## 2014-10-11 ENCOUNTER — Telehealth: Payer: 59 | Admitting: Nurse Practitioner

## 2014-10-11 DIAGNOSIS — M545 Low back pain: Secondary | ICD-10-CM

## 2014-10-11 NOTE — Progress Notes (Signed)
Based on what you shared with me it looks like you have a serious condition that should be evaluated in a face to face office visit.  * I AM SORRY BUT WE CANNOT DO PAIN MEDICATION ON AN E VISIT_ YOU WILL HAVE TO SEE YOUR PCP  If you are having a true medical emergency please call 911.  If you need an urgent face to face visit, [facility] has four urgent care centers for your convenience.  . [facility] a Provider at this Location  [address] [place], [place] [address] . 8 am to 8 pm Monday-Friday . 9 am to 7 pm Saturday-Sunday  . [facility] [place] a Provider at this Location  [address], [place], [place] [address] . 8 am to 8 pm Monday-Friday . 9 am to 6 pm Saturday . 11 am to 6 pm Sunday   . [facility] [place] Get Driving Directions  [address] [place], [place] [address] . 8 am to 8 pm Monday-Friday . 9 am to 4 pm Saturday-Sunday   . [facility] (a walk in primary care provider)  [place] a Provider at this Location  [place], [place] [address] . 8 am to 8:30 pm Monday-Thursday . 8 am to 6 pm Friday . 8 am to 4 pm Saturday-Sunday   Your e-visit answers were reviewed by a board certified advanced clinical practitioner to complete your personal care plan.  Depending on the condition, your plan could have included both over the counter or prescription medications.  You will get an e-mail in the next two days asking about your experience.  I hope that your e-visit has been valuable and will speed your recovery . Thank you for choosing an e-visit.

## 2014-10-17 DIAGNOSIS — G4733 Obstructive sleep apnea (adult) (pediatric): Secondary | ICD-10-CM | POA: Diagnosis not present

## 2014-10-17 DIAGNOSIS — I10 Essential (primary) hypertension: Secondary | ICD-10-CM | POA: Diagnosis not present

## 2014-10-17 DIAGNOSIS — R5381 Other malaise: Secondary | ICD-10-CM | POA: Diagnosis not present

## 2014-10-24 NOTE — Progress Notes (Signed)
09/17/14 1200  PT Visit Information  Last PT Received On 09/17/14  Assistance Needed +1  History of Present Illness 74 yo female with onset of pubic ramus fracture due to sitting down hard at a ditch was admitted and noted L3 compression fracture and possibly an issue with L4.    Precautions  Precautions Fall;Back  Precaution Booklet Issued No  Restrictions  Weight Bearing Restrictions No  Home Living  Family/patient expects to be discharged to: Private residence  Living Arrangements Spouse/significant other  Available Help at Discharge Family  Type of [place] to enter  Entrance Stairs-Number of Steps 1  Entrance Stairs-Rails None  Home Layout Two level;Able to live on main level with bedroom/bathroom  Alternate Level Stairs-Number of Steps 13  Alternate Level Stairs-Rails Right  Home Equipment Walker - 2 wheels  Prior Function  Level of Independence Independent  Communication  Communication No difficulties  Pain Assessment  Pain Assessment 0-10  Pain Score 5  Pain Location groin  Pain Intervention(s) Limited activity within patient's tolerance;Monitored during session;Premedicated before session;Repositioned  Cognition  Arousal/Alertness Awake/alert  Behavior During Therapy WFL for tasks assessed/performed  Overall Cognitive Status Within Functional Limits for tasks assessed  Upper Extremity Assessment  Upper Extremity Assessment Overall WFL for tasks assessed  Lower Extremity Assessment  Lower Extremity Assessment Overall WFL for tasks assessed  Cervical / Trunk Assessment  Cervical / Trunk Assessment Normal  Bed Mobility  Overal bed mobility Needs Assistance  Bed Mobility Supine to Sit  Supine to sit Min assist  General bed mobility comments pt using minimal help to scoot out to edge of bed and had cues for sequencing rolling to side for back safety  Transfers  Overall transfer level Needs assistance  Equipment used Rolling walker (2 wheeled)   Transfers Sit to/from Stand;Stand Pivot Transfers  Sit to Stand Min guard;Min assist  Stand pivot transfers Min guard  General transfer comment limited help to power up the first 2 trials then could stand without help  Ambulation/Gait  Ambulation/Gait assistance Min guard  Ambulation Distance (Feet) 150 Feet  Assistive device Rolling walker (2 wheeled)  Gait Pattern/deviations Step-through pattern;Decreased step length - right;Decreased step length - left;Decreased dorsiflexion - right;Decreased dorsiflexion - left;Wide base of support;Antalgic  Gait velocity reduced  Gait velocity interpretation Below normal speed for age/gender  Balance  Overall balance assessment Needs assistance  Sitting-balance support Feet supported  Sitting balance-Leahy Scale Good  Standing balance support Bilateral upper extremity supported  Standing balance-Leahy Scale Fair  Standing balance comment fair- dynamic support  General Comments  General comments (skin integrity, edema, etc.) Pt is planning to go home from hospital and was able to get OOB and to chair with minor assistance.  Planning to take her on steps tomorrow to ensure a safe transition to home.  PT - End of Session  Equipment Utilized During Treatment Gait belt  Activity Tolerance Patient tolerated treatment well;No increased pain  Patient left in chair;with call bell/phone within reach;with chair alarm set  Nurse Communication Mobility status  PT Assessment  PT Therapy Diagnosis  Difficulty walking;Acute pain  PT Recommendation/Assessment Patient needs continued PT services  PT Problem List Decreased range of motion;Decreased activity tolerance;Decreased balance;Decreased mobility;Decreased coordination;Decreased knowledge of use of DME;Decreased strength;Pain  Barriers to Discharge Inaccessible home environment  Barriers to Discharge Comments Needs to climb step to enter  PT Plan  PT Frequency (ACUTE ONLY) 7X/week  PT  Treatment/Interventions (ACUTE ONLY) DME instruction;Gait training;Stair training;Functional  mobility training;Therapeutic activities;Therapeutic exercise;Neuromuscular re-education;Balance training;Patient/family education  PT Recommendation  Follow Up Recommendations Home health PT;Supervision/Assistance - 24 hour  PT equipment Rolling walker with 5" wheels (unless hers is functional)  Individuals Consulted  Consulted and Agree with Results and Recommendations Patient  Acute Rehab PT Goals  Patient Stated Goal to get home and travel in a month  PT Goal Formulation With patient  Time For Goal Achievement 10/01/14  Potential to Achieve Goals Good  PT Time Calculation  PT Start Time (ACUTE ONLY) 1128  PT Stop Time (ACUTE ONLY) 1155  PT Time Calculation (min) (ACUTE ONLY) 27 min  PT G-Codes **NOT FOR INPATIENT CLASS**  Functional Assessment Tool Used clinical judgment  Functional Limitation Mobility: Walking and moving around  Mobility: Walking and Moving Around Current Status (G9201) CJ  Mobility: Walking and Moving Around Goal Status (E0712) CI  PT General Charges  $$ ACUTE PT VISIT 1 Procedure  PT Evaluation  $Initial PT Evaluation Tier I 1 Procedure  PT Treatments  $Gait Training 8-22 mins   Late entry for missed G-code. Based on review of the evaluation and goals by Mee Hives, PT. Lady Deutscher PT, DPT 10/24/2014  9:06 AM

## 2015-01-16 DIAGNOSIS — M545 Low back pain: Secondary | ICD-10-CM | POA: Diagnosis not present

## 2015-01-19 DIAGNOSIS — Z23 Encounter for immunization: Secondary | ICD-10-CM | POA: Diagnosis not present

## 2015-01-23 DIAGNOSIS — M545 Low back pain: Secondary | ICD-10-CM | POA: Diagnosis not present

## 2015-01-23 DIAGNOSIS — G8929 Other chronic pain: Secondary | ICD-10-CM | POA: Diagnosis not present

## 2015-01-26 DIAGNOSIS — M545 Low back pain: Secondary | ICD-10-CM | POA: Diagnosis not present

## 2015-01-26 DIAGNOSIS — G8929 Other chronic pain: Secondary | ICD-10-CM | POA: Diagnosis not present

## 2015-01-30 DIAGNOSIS — G8929 Other chronic pain: Secondary | ICD-10-CM | POA: Diagnosis not present

## 2015-01-30 DIAGNOSIS — M545 Low back pain: Secondary | ICD-10-CM | POA: Diagnosis not present

## 2015-02-02 DIAGNOSIS — M545 Low back pain: Secondary | ICD-10-CM | POA: Diagnosis not present

## 2015-02-02 DIAGNOSIS — G8929 Other chronic pain: Secondary | ICD-10-CM | POA: Diagnosis not present

## 2015-02-07 DIAGNOSIS — M545 Low back pain: Secondary | ICD-10-CM | POA: Diagnosis not present

## 2015-02-07 DIAGNOSIS — G8929 Other chronic pain: Secondary | ICD-10-CM | POA: Diagnosis not present

## 2015-02-09 DIAGNOSIS — M545 Low back pain: Secondary | ICD-10-CM | POA: Diagnosis not present

## 2015-02-09 DIAGNOSIS — G8929 Other chronic pain: Secondary | ICD-10-CM | POA: Diagnosis not present

## 2015-02-13 DIAGNOSIS — G8929 Other chronic pain: Secondary | ICD-10-CM | POA: Diagnosis not present

## 2015-02-13 DIAGNOSIS — M545 Low back pain: Secondary | ICD-10-CM | POA: Diagnosis not present

## 2015-02-16 DIAGNOSIS — M545 Low back pain: Secondary | ICD-10-CM | POA: Diagnosis not present

## 2015-02-16 DIAGNOSIS — G8929 Other chronic pain: Secondary | ICD-10-CM | POA: Diagnosis not present

## 2015-02-21 DIAGNOSIS — M545 Low back pain: Secondary | ICD-10-CM | POA: Diagnosis not present

## 2015-02-21 DIAGNOSIS — G8929 Other chronic pain: Secondary | ICD-10-CM | POA: Diagnosis not present

## 2015-02-23 DIAGNOSIS — G8929 Other chronic pain: Secondary | ICD-10-CM | POA: Diagnosis not present

## 2015-02-23 DIAGNOSIS — M545 Low back pain: Secondary | ICD-10-CM | POA: Diagnosis not present

## 2015-02-28 DIAGNOSIS — L821 Other seborrheic keratosis: Secondary | ICD-10-CM | POA: Diagnosis not present

## 2015-02-28 DIAGNOSIS — D692 Other nonthrombocytopenic purpura: Secondary | ICD-10-CM | POA: Diagnosis not present

## 2015-02-28 DIAGNOSIS — D225 Melanocytic nevi of trunk: Secondary | ICD-10-CM | POA: Diagnosis not present

## 2015-02-28 DIAGNOSIS — D1801 Hemangioma of skin and subcutaneous tissue: Secondary | ICD-10-CM | POA: Diagnosis not present

## 2015-02-28 DIAGNOSIS — D2371 Other benign neoplasm of skin of right lower limb, including hip: Secondary | ICD-10-CM | POA: Diagnosis not present

## 2015-02-28 DIAGNOSIS — C44712 Basal cell carcinoma of skin of right lower limb, including hip: Secondary | ICD-10-CM | POA: Diagnosis not present

## 2015-02-28 DIAGNOSIS — L814 Other melanin hyperpigmentation: Secondary | ICD-10-CM | POA: Diagnosis not present

## 2015-02-28 DIAGNOSIS — Z85828 Personal history of other malignant neoplasm of skin: Secondary | ICD-10-CM | POA: Diagnosis not present

## 2015-03-01 DIAGNOSIS — M545 Low back pain: Secondary | ICD-10-CM | POA: Diagnosis not present

## 2015-03-01 DIAGNOSIS — G8929 Other chronic pain: Secondary | ICD-10-CM | POA: Diagnosis not present

## 2015-03-06 DIAGNOSIS — Z9884 Bariatric surgery status: Secondary | ICD-10-CM | POA: Diagnosis not present

## 2015-03-06 DIAGNOSIS — K912 Postsurgical malabsorption, not elsewhere classified: Secondary | ICD-10-CM | POA: Diagnosis not present

## 2015-03-06 DIAGNOSIS — R638 Other symptoms and signs concerning food and fluid intake: Secondary | ICD-10-CM | POA: Diagnosis not present

## 2015-03-08 DIAGNOSIS — M545 Low back pain: Secondary | ICD-10-CM | POA: Diagnosis not present

## 2015-03-08 DIAGNOSIS — G8929 Other chronic pain: Secondary | ICD-10-CM | POA: Diagnosis not present

## 2015-03-09 DIAGNOSIS — C44712 Basal cell carcinoma of skin of right lower limb, including hip: Secondary | ICD-10-CM | POA: Diagnosis not present

## 2015-03-09 DIAGNOSIS — Z85828 Personal history of other malignant neoplasm of skin: Secondary | ICD-10-CM | POA: Diagnosis not present

## 2015-03-10 DIAGNOSIS — M545 Low back pain: Secondary | ICD-10-CM | POA: Diagnosis not present

## 2015-03-10 DIAGNOSIS — G8929 Other chronic pain: Secondary | ICD-10-CM | POA: Diagnosis not present

## 2015-03-14 DIAGNOSIS — G8929 Other chronic pain: Secondary | ICD-10-CM | POA: Diagnosis not present

## 2015-03-14 DIAGNOSIS — M545 Low back pain: Secondary | ICD-10-CM | POA: Diagnosis not present

## 2015-03-16 DIAGNOSIS — G8929 Other chronic pain: Secondary | ICD-10-CM | POA: Diagnosis not present

## 2015-03-16 DIAGNOSIS — M545 Low back pain: Secondary | ICD-10-CM | POA: Diagnosis not present

## 2015-03-20 DIAGNOSIS — G8929 Other chronic pain: Secondary | ICD-10-CM | POA: Diagnosis not present

## 2015-03-20 DIAGNOSIS — M545 Low back pain: Secondary | ICD-10-CM | POA: Diagnosis not present

## 2015-03-22 DIAGNOSIS — G8929 Other chronic pain: Secondary | ICD-10-CM | POA: Diagnosis not present

## 2015-03-22 DIAGNOSIS — M545 Low back pain: Secondary | ICD-10-CM | POA: Diagnosis not present

## 2015-03-28 DIAGNOSIS — G8929 Other chronic pain: Secondary | ICD-10-CM | POA: Diagnosis not present

## 2015-03-28 DIAGNOSIS — M545 Low back pain: Secondary | ICD-10-CM | POA: Diagnosis not present

## 2015-04-04 DIAGNOSIS — G8929 Other chronic pain: Secondary | ICD-10-CM | POA: Diagnosis not present

## 2015-04-04 DIAGNOSIS — M545 Low back pain: Secondary | ICD-10-CM | POA: Diagnosis not present

## 2015-04-05 DIAGNOSIS — Z0000 Encounter for general adult medical examination without abnormal findings: Secondary | ICD-10-CM | POA: Diagnosis not present

## 2015-04-06 DIAGNOSIS — G8929 Other chronic pain: Secondary | ICD-10-CM | POA: Diagnosis not present

## 2015-04-06 DIAGNOSIS — M545 Low back pain: Secondary | ICD-10-CM | POA: Diagnosis not present

## 2015-04-12 DIAGNOSIS — G8929 Other chronic pain: Secondary | ICD-10-CM | POA: Diagnosis not present

## 2015-04-12 DIAGNOSIS — M545 Low back pain: Secondary | ICD-10-CM | POA: Diagnosis not present

## 2015-04-13 DIAGNOSIS — N644 Mastodynia: Secondary | ICD-10-CM | POA: Diagnosis not present

## 2015-04-14 DIAGNOSIS — M545 Low back pain: Secondary | ICD-10-CM | POA: Diagnosis not present

## 2015-04-14 DIAGNOSIS — G8929 Other chronic pain: Secondary | ICD-10-CM | POA: Diagnosis not present

## 2015-04-20 DIAGNOSIS — M545 Low back pain: Secondary | ICD-10-CM | POA: Diagnosis not present

## 2015-04-20 DIAGNOSIS — G8929 Other chronic pain: Secondary | ICD-10-CM | POA: Diagnosis not present

## 2015-05-25 DIAGNOSIS — R002 Palpitations: Secondary | ICD-10-CM | POA: Diagnosis not present

## 2015-05-25 DIAGNOSIS — I10 Essential (primary) hypertension: Secondary | ICD-10-CM | POA: Diagnosis not present

## 2015-05-25 DIAGNOSIS — K219 Gastro-esophageal reflux disease without esophagitis: Secondary | ICD-10-CM | POA: Diagnosis not present

## 2015-05-25 DIAGNOSIS — G4733 Obstructive sleep apnea (adult) (pediatric): Secondary | ICD-10-CM | POA: Diagnosis not present

## 2015-05-26 ENCOUNTER — Telehealth: Payer: Self-pay | Admitting: Gastroenterology

## 2015-05-26 NOTE — Telephone Encounter (Signed)
colonoscopy

## 2015-05-30 NOTE — Telephone Encounter (Signed)
Left message with pt's husband. Pt not home. Call pt back Wednesday afternoon.

## 2015-05-31 ENCOUNTER — Other Ambulatory Visit: Payer: Self-pay

## 2015-05-31 DIAGNOSIS — E034 Atrophy of thyroid (acquired): Secondary | ICD-10-CM | POA: Diagnosis not present

## 2015-05-31 DIAGNOSIS — I10 Essential (primary) hypertension: Secondary | ICD-10-CM | POA: Diagnosis not present

## 2015-05-31 DIAGNOSIS — G4733 Obstructive sleep apnea (adult) (pediatric): Secondary | ICD-10-CM | POA: Diagnosis not present

## 2015-05-31 DIAGNOSIS — R079 Chest pain, unspecified: Secondary | ICD-10-CM | POA: Diagnosis not present

## 2015-05-31 NOTE — Telephone Encounter (Signed)
Gastroenterology Pre-Procedure Review  Request Date: 07/18/15 Requesting Physician: Dr. Lavera Guise  PATIENT REVIEW QUESTIONS: The patient responded to the following health history questions as indicated:    1. Are you having any GI issues? no had gastric bypass  2. Do you have a personal history of Polyps? no 3. Do you have a family history of Colon Cancer or Polyps? no 4. Diabetes Mellitus? no 5. Joint replacements in the past 12 months? Not 1 year ago but needs antibiotics 6. Major health problems in the past 3 months?no 7. Any artificial heart valves, MVP, or defibrillator?no    MEDICATIONS & ALLERGIES:    Patient reports the following regarding taking any anticoagulation/antiplatelet therapy:   Plavix, Coumadin, Eliquis, Xarelto, Lovenox, Pradaxa, Brilinta, or Effient? no Aspirin? yes (ASA 81mg )  Patient confirms/reports the following medications:  Current Outpatient Prescriptions  Medication Sig Dispense Refill  . ALPRAZolam (XANAX) 0.5 MG tablet Take 0.5 mg by mouth at bedtime as needed for anxiety.    Marland Kitchen aspirin 81 MG tablet Take 81 mg by mouth daily.    Marland Kitchen BIOTIN PO Take 1 capsule by mouth daily.    Marland Kitchen CALCIUM CARBONATE PO Take 1 tablet by mouth 2 (two) times daily.    . cholecalciferol (VITAMIN D) 1000 UNITS tablet Take 1,000 Units by mouth daily.    . DiphenhydrAMINE HCl (ZZZQUIL) 50 MG/30ML LIQD Take 30 mLs by mouth at bedtime as needed (for sleep).    Marland Kitchen estrogens, conjugated, (PREMARIN) 0.625 MG tablet Take 0.625 mg by mouth daily. Take daily for 21 days then do not take for 7 days.    Marland Kitchen HYDROcodone-acetaminophen (NORCO/VICODIN) 5-325 MG per tablet Take 1-2 tablets by mouth every 4 (four) hours as needed for moderate pain. 30 tablet 0  . ibandronate (BONIVA) 150 MG tablet Take 150 mg by mouth every 30 (thirty) days. Take in the morning with a full glass of water, on an empty stomach, and do not take anything else by mouth or lie down for the next 30 min.    . Multiple  Vitamins-Minerals (MULTIVITAMIN PO) Take 1 tablet by mouth 2 (two) times daily.    . ondansetron (ZOFRAN) 4 MG tablet Take 1 tablet (4 mg total) by mouth every 6 (six) hours as needed for nausea. 20 tablet 0  . Probiotic Product (PROBIOTIC PO) Take 1 capsule by mouth daily.     No current facility-administered medications for this visit.    Patient confirms/reports the following allergies:  Allergies  Allergen Reactions  . Penicillins Swelling    No orders of the defined types were placed in this encounter.    AUTHORIZATION INFORMATION Primary Insurance: 1D#: Group #:  Secondary Insurance: 1D#: Group #:  SCHEDULE INFORMATION: Date: 07/18/15 Time: Location: [facility]

## 2015-06-02 DIAGNOSIS — M545 Low back pain: Secondary | ICD-10-CM | POA: Diagnosis not present

## 2015-06-02 DIAGNOSIS — R030 Elevated blood-pressure reading, without diagnosis of hypertension: Secondary | ICD-10-CM | POA: Diagnosis not present

## 2015-06-05 DIAGNOSIS — R079 Chest pain, unspecified: Secondary | ICD-10-CM | POA: Diagnosis not present

## 2015-06-05 DIAGNOSIS — R002 Palpitations: Secondary | ICD-10-CM | POA: Diagnosis not present

## 2015-06-06 ENCOUNTER — Other Ambulatory Visit: Payer: Self-pay

## 2015-06-06 MED ORDER — DEXTROSE 5 % IV SOLN: 600.0000 mg | Freq: Once | INTRAVENOUS | Status: DC

## 2015-06-06 NOTE — Telephone Encounter (Signed)
Pt scheduled for screening colonoscopy at [facility] on 07/18/15. Instructs/rx mailed. Please check precert.

## 2015-06-06 NOTE — Telephone Encounter (Signed)
No authorization is required--REF# [id number]

## 2015-06-20 DIAGNOSIS — M47816 Spondylosis without myelopathy or radiculopathy, lumbar region: Secondary | ICD-10-CM | POA: Diagnosis not present

## 2015-06-20 DIAGNOSIS — M5136 Other intervertebral disc degeneration, lumbar region: Secondary | ICD-10-CM | POA: Diagnosis not present

## 2015-06-26 DIAGNOSIS — S8001XA Contusion of right knee, initial encounter: Secondary | ICD-10-CM | POA: Diagnosis not present

## 2015-06-26 DIAGNOSIS — M25462 Effusion, left knee: Secondary | ICD-10-CM | POA: Diagnosis not present

## 2015-06-26 DIAGNOSIS — Z471 Aftercare following joint replacement surgery: Secondary | ICD-10-CM | POA: Diagnosis not present

## 2015-06-26 DIAGNOSIS — M25061 Hemarthrosis, right knee: Secondary | ICD-10-CM | POA: Diagnosis not present

## 2015-06-26 DIAGNOSIS — M25561 Pain in right knee: Secondary | ICD-10-CM | POA: Diagnosis not present

## 2015-06-26 DIAGNOSIS — M1711 Unilateral primary osteoarthritis, right knee: Secondary | ICD-10-CM | POA: Diagnosis not present

## 2015-06-26 DIAGNOSIS — Z96652 Presence of left artificial knee joint: Secondary | ICD-10-CM | POA: Diagnosis not present

## 2015-07-17 ENCOUNTER — Encounter: Payer: Self-pay | Admitting: *Deleted

## 2015-07-18 ENCOUNTER — Encounter: Payer: Self-pay | Admitting: Anesthesiology

## 2015-07-18 ENCOUNTER — Ambulatory Visit: Payer: Medicare Other | Admitting: Anesthesiology

## 2015-07-18 ENCOUNTER — Ambulatory Visit
Admission: RE | Admit: 2015-07-18 | Discharge: 2015-07-18 | Disposition: A | Discharge status: Home / Self Care | Payer: Medicare Other | Source: Ambulatory Visit | Attending: Gastroenterology | Admitting: Gastroenterology

## 2015-07-18 ENCOUNTER — Ambulatory Visit: Admit: 2015-07-18 | Payer: Self-pay | Admitting: Gastroenterology

## 2015-07-18 ENCOUNTER — Encounter
Admission: RE | Disposition: A | Discharge status: Home / Self Care | Payer: Self-pay | Source: Ambulatory Visit | Attending: Gastroenterology

## 2015-07-18 DIAGNOSIS — K5730 Diverticulosis of large intestine without perforation or abscess without bleeding: Secondary | ICD-10-CM | POA: Diagnosis not present

## 2015-07-18 DIAGNOSIS — K635 Polyp of colon: Secondary | ICD-10-CM | POA: Diagnosis not present

## 2015-07-18 DIAGNOSIS — K648 Other hemorrhoids: Secondary | ICD-10-CM | POA: Diagnosis not present

## 2015-07-18 DIAGNOSIS — G4730 Sleep apnea, unspecified: Secondary | ICD-10-CM | POA: Insufficient documentation

## 2015-07-18 DIAGNOSIS — J449 Chronic obstructive pulmonary disease, unspecified: Secondary | ICD-10-CM | POA: Diagnosis not present

## 2015-07-18 DIAGNOSIS — D123 Benign neoplasm of transverse colon: Secondary | ICD-10-CM | POA: Insufficient documentation

## 2015-07-18 DIAGNOSIS — Z7982 Long term (current) use of aspirin: Secondary | ICD-10-CM | POA: Diagnosis not present

## 2015-07-18 DIAGNOSIS — Z87891 Personal history of nicotine dependence: Secondary | ICD-10-CM | POA: Insufficient documentation

## 2015-07-18 DIAGNOSIS — Z1211 Encounter for screening for malignant neoplasm of colon: Secondary | ICD-10-CM | POA: Insufficient documentation

## 2015-07-18 DIAGNOSIS — Z9884 Bariatric surgery status: Secondary | ICD-10-CM | POA: Insufficient documentation

## 2015-07-18 HISTORY — PX: COLONOSCOPY WITH PROPOFOL: SHX5780

## 2015-07-18 SURGERY — COLONOSCOPY WITH PROPOFOL
Anesthesia: General

## 2015-07-18 MED ORDER — LACTATED RINGERS IV SOLN
INTRAVENOUS | Status: DC | PRN
  Administered 2015-07-18: 11:00:00 via INTRAVENOUS

## 2015-07-18 MED ORDER — MIDAZOLAM HCL 2 MG/2ML IJ SOLN
INTRAMUSCULAR | Status: DC | PRN
Start: 2015-07-18 — End: 2015-07-18
  Administered 2015-07-18: 1 mg via INTRAVENOUS

## 2015-07-18 MED ORDER — SODIUM CHLORIDE 0.9 % IV SOLN
INTRAVENOUS | Status: DC
  Administered 2015-07-18: 1000 mL via INTRAVENOUS

## 2015-07-18 MED ORDER — LIDOCAINE HCL (PF) 1 % IJ SOLN
2.0000 mL | Freq: Once | INTRAMUSCULAR | Status: AC
  Administered 2015-07-18: 0.03 mL via INTRADERMAL
  Filled 2015-07-18: qty 2

## 2015-07-18 NOTE — Transfer of Care (Signed)
Immediate Anesthesia Transfer of Care Note  Patient: Sonya Park  Procedure(s) Performed: Procedure(s): COLONOSCOPY WITH PROPOFOL (N/A)  Patient Location: PACU  Anesthesia Type:General  Level of Consciousness: awake, alert  and oriented  Airway & Oxygen Therapy: Patient Spontanous Breathing  Post-op Assessment: Report given to RN  Post vital signs: Reviewed and stable  Last Vitals:  Filed Vitals:   07/18/15 0954 07/18/15 1105  BP: 170/96 134/64  Pulse: 73 80  Temp: 36.3 C 37 C  Resp: 17 20    Complications: No apparent anesthesia complications

## 2015-07-18 NOTE — Transfer of Care (Signed)
Immediate Anesthesia Transfer of Care Note  Patient: Sonya Park  Procedure(s) Performed: Procedure(s): COLONOSCOPY WITH PROPOFOL (N/A)  Patient Location: PACU  Anesthesia Type:General  Level of Consciousness: awake  Airway & Oxygen Therapy: Patient Spontanous Breathing  Post-op Assessment: Report given to RN  Post vital signs: Reviewed and stable  Last Vitals:  Filed Vitals:   07/18/15 0954 07/18/15 1105  BP: 170/96 134/64  Pulse: 73 80  Temp: 36.3 C 37 C  Resp: 17 20    Complications: No apparent anesthesia complications

## 2015-07-18 NOTE — Anesthesia Postprocedure Evaluation (Signed)
Anesthesia Post Note  Patient: Sonya Park  Procedure(s) Performed: Procedure(s) (LRB): COLONOSCOPY WITH PROPOFOL (N/A)  Patient location during evaluation: Endoscopy Anesthesia Type: General Level of consciousness: awake and alert Pain management: pain level controlled Vital Signs Assessment: post-procedure vital signs reviewed and stable Respiratory status: spontaneous breathing, nonlabored ventilation, respiratory function stable and patient connected to nasal cannula oxygen Cardiovascular status: blood pressure returned to baseline and stable Postop Assessment: no signs of nausea or vomiting Anesthetic complications: no    Last Vitals:  Filed Vitals:   07/18/15 1120 07/18/15 1130  BP: 110/85 137/88  Pulse: 73 76  Temp:    Resp: 15 19    Last Pain: There were no vitals filed for this visit.               Martha Clan

## 2015-07-18 NOTE — H&P (Signed)
[facility]  [address] [place], [place] [address] Phone: [phone] Fax : [phone]  Primary Care Physician:  Cletis Athens, MD Primary Gastroenterologist:  Dr. Allen Norris  Pre-Procedure History & Physical: HPI:  Sonya Park is a 75 y.o. female is here for a screening colonoscopy.   Past Medical History  Diagnosis Date  . Medical history non-contributory     Past Surgical History  Procedure Laterality Date  . Abdominal hysterectomy    . Replacement total knee    . Gastric bypass      X2  . Hemoroid    . Cosmetic surgery    . Knee arthroscopy    . Joint replacement      Prior to Admission medications   Medication Sig Start Date End Date Taking? Authorizing Provider  ALPRAZolam Duanne Moron) 0.5 MG tablet Take 0.5 mg by mouth at bedtime as needed for anxiety. Reported on 05/31/2015    Historical Provider, MD  aspirin 81 MG tablet Take 81 mg by mouth daily.    Historical Provider, MD  BIOTIN PO Take 1 capsule by mouth daily.    Historical Provider, MD  CALCIUM CARBONATE PO Take 1 tablet by mouth 2 (two) times daily.    Historical Provider, MD  cholecalciferol (VITAMIN D) 1000 UNITS tablet Take 1,000 Units by mouth daily.    Historical Provider, MD  DiphenhydrAMINE HCl (ZZZQUIL) 50 MG/30ML LIQD Take 30 mLs by mouth at bedtime as needed (for sleep).    Historical Provider, MD  estrogens, conjugated, (PREMARIN) 0.625 MG tablet Take 0.625 mg by mouth daily. Take daily for 21 days then do not take for 7 days.    Historical Provider, MD  HYDROcodone-acetaminophen (NORCO/VICODIN) 5-325 MG per tablet Take 1-2 tablets by mouth every 4 (four) hours as needed for moderate pain. 09/18/14   Fritzi Mandes, MD  ibandronate (BONIVA) 150 MG tablet Take 150 mg by mouth every 30 (thirty) days. Take in the morning with a full glass of water, on an empty stomach, and do not take anything else by mouth or lie down for the next 30 min.    Historical Provider, MD  Multiple  Vitamins-Minerals (MULTIVITAMIN PO) Take 1 tablet by mouth 2 (two) times daily.    Historical Provider, MD  ondansetron (ZOFRAN) 4 MG tablet Take 1 tablet (4 mg total) by mouth every 6 (six) hours as needed for nausea. Patient not taking: Reported on 05/31/2015 09/18/14   Fritzi Mandes, MD  Probiotic Product (PROBIOTIC PO) Take 1 capsule by mouth daily.    Historical Provider, MD    Allergies as of 06/06/2015 - Review Complete 10/11/2014  Allergen Reaction Noted  . Penicillins Swelling 09/17/2014    Family History  Problem Relation Age of Onset  . Liver disease Mother   . Heart attack Father   . Cancer Father   . CAD Sister     Social History   Social History  . Marital Status: Married    Spouse Name: N/A  . Number of Children: N/A  . Years of Education: N/A   Occupational History  . Not on file.   Social History Main Topics  . Smoking status: Former Research scientist (life sciences)  . Smokeless tobacco: Never Used  . Alcohol Use: 1.8 oz/week    3 Glasses of wine per week  . Drug Use: No  . Sexual Activity: Not on file   Other Topics Concern  . Not on file   Social History Narrative    Review of Systems: See  HPI, otherwise negative ROS  Physical Exam: BP 170/96 mmHg  Pulse 73  Temp(Src) 97.4 F (36.3 C) (Tympanic)  Resp 17  Ht 5\' 6"  (1.676 m)  Wt 150 lb (68.04 kg)  BMI 24.22 kg/m2  SpO2 98% General:   Alert,  pleasant and cooperative in NAD Head:  Normocephalic and atraumatic. Neck:  Supple; no masses or thyromegaly. Lungs:  Clear throughout to auscultation.    Heart:  Regular rate and rhythm. Abdomen:  Soft, nontender and nondistended. Normal bowel sounds, without guarding, and without rebound.   Neurologic:  Alert and  oriented x4;  grossly normal neurologically.  Impression/Plan: Sonya Park is now here to undergo a screening colonoscopy.  Risks, benefits, and alternatives regarding colonoscopy have been reviewed with the patient.  Questions have been answered.  All  parties agreeable.

## 2015-07-18 NOTE — Op Note (Signed)
[facility] Gastroenterology Patient Name: Sonya Park Procedure Date: 07/18/2015 10:43 AM MRN: [id number] Account #: [id number] Date of Birth: 02/08/1941 Admit Type: Outpatient Age: 75 Room: [facility] ENDO ROOM 4 Gender: Female Note Status: Finalized Procedure:            Colonoscopy Indications:          Screening for colorectal malignant neoplasm Providers:            [name], MD Referring MD:         Cletis Athens, MD (Referring MD) Medicines:            Propofol per Anesthesia Complications:        No immediate complications. Procedure:            Pre-Anesthesia Assessment:                       - Prior to the procedure, a History and Physical was                        performed, and patient medications and allergies were                        reviewed. The patient's tolerance of previous                        anesthesia was also reviewed. The risks and benefits of                        the procedure and the sedation options and risks were                        discussed with the patient. All questions were                        answered, and informed consent was obtained. Prior                        Anticoagulants: The patient has taken no previous                        anticoagulant or antiplatelet agents. ASA Grade                        Assessment: II - A patient with mild systemic disease.                        After reviewing the risks and benefits, the patient was                        deemed in satisfactory condition to undergo the                        procedure.                       After obtaining informed consent, the colonoscope was                        passed under direct vision. Throughout the procedure,  the patient's blood pressure, pulse, and oxygen                        saturations were monitored continuously. The                        Colonoscope was introduced through the anus and     advanced to the the cecum, identified by appendiceal                        orifice and ileocecal valve. The colonoscopy was                        performed without difficulty. The patient tolerated the                        procedure well. The quality of the bowel preparation                        was excellent. Findings:      The perianal and digital rectal examinations were normal.      A 6 mm polyp was found in the transverse colon. The polyp was sessile.       The polyp was removed with a cold snare. Resection and retrieval were       complete.      Non-bleeding internal hemorrhoids were found during retroflexion. The       hemorrhoids were Grade II (internal hemorrhoids that prolapse but reduce       spontaneously).      Multiple small-mouthed diverticula were found in the sigmoid colon. Impression:           - One 6 mm polyp in the transverse colon, removed with                        a cold snare. Resected and retrieved.                       - Non-bleeding internal hemorrhoids.                       - Diverticulosis in the sigmoid colon. Recommendation:       - Await pathology results. Procedure Code(s):    --- Professional ---                       [id number], Colonoscopy, flexible; with removal of tumor(s),                        polyp(s), or other lesion(s) by snare technique Diagnosis Code(s):    --- Professional ---                       Z12.11, Encounter for screening for malignant neoplasm                        of colon                       D12.3, Benign neoplasm of transverse colon (hepatic                        flexure or  splenic flexure) CPT copyright 2016 American Medical Association. All rights reserved. The codes documented in this report are preliminary and upon coder review may  be revised to meet current compliance requirements. [name], MD 07/18/2015 11:05:21 AM This report has been signed electronically. Number of Addenda: 0 Note Initiated On:  07/18/2015 10:43 AM Scope Withdrawal Time: 0 hours 9 minutes 13 seconds  Total Procedure Duration: 0 hours 13 minutes 19 seconds       [facility]

## 2015-07-18 NOTE — Anesthesia Preprocedure Evaluation (Signed)
Anesthesia Evaluation  Patient identified by MRN, date of birth, ID band Patient awake    Reviewed: Allergy & Precautions, H&P , NPO status , Patient's Chart, lab work & pertinent test results, reviewed documented beta blocker date and time   History of Anesthesia Complications Negative for: history of anesthetic complications  Airway Mallampati: II  TM Distance: >3 FB Neck ROM: full    Dental no notable dental hx. 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:   Pulmonary neg shortness of breath, sleep apnea and Continuous Positive Airway Pressure Ventilation , neg COPD, neg recent URI, former smoker,    Pulmonary exam normal breath sounds clear to auscultation       Cardiovascular Exercise Tolerance: Good negative cardio ROS Normal cardiovascular exam Rhythm:regular Rate:Normal     Neuro/Psych negative neurological ROS  negative psych ROS   GI/Hepatic negative GI ROS, Neg liver ROS,   Endo/Other  negative endocrine ROS  Renal/GU negative Renal ROS  negative genitourinary   Musculoskeletal   Abdominal   Peds  Hematology negative hematology ROS (+)   Anesthesia Other Findings Past Medical History:   Medical history non-contributory                             Reproductive/Obstetrics negative OB ROS                             Anesthesia Physical Anesthesia Plan  ASA: II  Anesthesia Plan: General   Post-op Pain Management:    Induction:   Airway Management Planned:   Additional Equipment:   Intra-op Plan:   Post-operative Plan:   Informed Consent: I have reviewed the patients History and Physical, chart, labs and discussed the procedure including the risks, benefits and alternatives for the proposed anesthesia with the patient or authorized representative who has indicated his/her understanding and acceptance.   Dental Advisory Given  Plan Discussed with:  Anesthesiologist, CRNA and Surgeon  Anesthesia Plan Comments:         Anesthesia Quick Evaluation

## 2015-07-19 ENCOUNTER — Encounter: Payer: Self-pay | Admitting: Gastroenterology

## 2015-07-19 LAB — SURGICAL PATHOLOGY

## 2015-07-24 ENCOUNTER — Telehealth: Payer: Self-pay | Admitting: Gastroenterology

## 2015-07-24 NOTE — Telephone Encounter (Signed)
Questions answered about colonoscopy results.

## 2015-07-24 NOTE — Telephone Encounter (Signed)
Questions regarding colonoscopy

## 2015-07-25 DIAGNOSIS — M25561 Pain in right knee: Secondary | ICD-10-CM | POA: Diagnosis not present

## 2015-07-25 DIAGNOSIS — M1711 Unilateral primary osteoarthritis, right knee: Secondary | ICD-10-CM | POA: Diagnosis not present

## 2015-07-25 DIAGNOSIS — G8929 Other chronic pain: Secondary | ICD-10-CM | POA: Diagnosis not present

## 2015-07-25 NOTE — Telephone Encounter (Signed)
Patient called back today and wants to know the names of the medications that they used to put her to sleep for her colonoscopy. She is still itchy and will be having knee surgery soon and thinks she might be allergic to it.

## 2015-08-01 ENCOUNTER — Encounter: Payer: Self-pay | Admitting: *Deleted

## 2015-08-01 ENCOUNTER — Emergency Department: Payer: Medicare Other

## 2015-08-01 ENCOUNTER — Emergency Department
Admission: EM | Admit: 2015-08-01 | Discharge: 2015-08-01 | Disposition: A | Discharge status: Home / Self Care | Payer: Medicare Other | Attending: Emergency Medicine | Admitting: Emergency Medicine

## 2015-08-01 DIAGNOSIS — R51 Headache: Secondary | ICD-10-CM | POA: Diagnosis not present

## 2015-08-01 DIAGNOSIS — Y9289 Other specified places as the place of occurrence of the external cause: Secondary | ICD-10-CM | POA: Insufficient documentation

## 2015-08-01 DIAGNOSIS — Z79899 Other long term (current) drug therapy: Secondary | ICD-10-CM | POA: Diagnosis not present

## 2015-08-01 DIAGNOSIS — Z87891 Personal history of nicotine dependence: Secondary | ICD-10-CM | POA: Insufficient documentation

## 2015-08-01 DIAGNOSIS — S0990XA Unspecified injury of head, initial encounter: Secondary | ICD-10-CM | POA: Diagnosis not present

## 2015-08-01 DIAGNOSIS — S0093XA Contusion of unspecified part of head, initial encounter: Secondary | ICD-10-CM | POA: Diagnosis not present

## 2015-08-01 DIAGNOSIS — W1839XA Other fall on same level, initial encounter: Secondary | ICD-10-CM | POA: Diagnosis not present

## 2015-08-01 DIAGNOSIS — Y9389 Activity, other specified: Secondary | ICD-10-CM | POA: Diagnosis not present

## 2015-08-01 DIAGNOSIS — W19XXXA Unspecified fall, initial encounter: Secondary | ICD-10-CM

## 2015-08-01 DIAGNOSIS — Y999 Unspecified external cause status: Secondary | ICD-10-CM | POA: Insufficient documentation

## 2015-08-01 DIAGNOSIS — S300XXA Contusion of lower back and pelvis, initial encounter: Secondary | ICD-10-CM | POA: Diagnosis not present

## 2015-08-01 DIAGNOSIS — Z7982 Long term (current) use of aspirin: Secondary | ICD-10-CM | POA: Diagnosis not present

## 2015-08-01 DIAGNOSIS — S79912A Unspecified injury of left hip, initial encounter: Secondary | ICD-10-CM | POA: Diagnosis not present

## 2015-08-01 DIAGNOSIS — Z96659 Presence of unspecified artificial knee joint: Secondary | ICD-10-CM | POA: Diagnosis not present

## 2015-08-01 DIAGNOSIS — M25552 Pain in left hip: Secondary | ICD-10-CM | POA: Diagnosis not present

## 2015-08-01 NOTE — ED Notes (Signed)
MD at bedside. 

## 2015-08-01 NOTE — ED Notes (Addendum)
Pt states she fell backwards out of the car at [place], denies any LOC, states she heard a crack, states she is only on a 81 mg ASA, pt awake and alert, states left hip pain as well

## 2015-08-01 NOTE — ED Provider Notes (Signed)
[facility] Emergency Department Provider Note  ____________________________________________  Time seen: Approximately [id number] PM  I have reviewed the triage vital signs and the nursing notes.   HISTORY  Chief Complaint Fall and Head Injury   HPI [place]a Park is a 75 y.o. female with a history of a pelvic fracture as well as lumbar compression fractures who is presenting today after a fall. She says she was stepping out of her car at [facility] when she lost her footing and fell onto her left buttock and hit the back of her head. She did not lose consciousness. However, she is concerned because she heard a "crack" on the back red when she fell. She denies any nausea, vomiting or dizziness. Denies any neck pain. Says that she also has "soreness" to her left buttock where she fell. She didn't ambulate after the event and actually went home to put meat in her refrigerator after the incident before coming to the emergency department.The patient only takes a daily aspirin for anticoagulation. He is with mild to moderate pain over the back of her head where she fell and hit the ground.   Past Medical History  Diagnosis Date  . Medical history non-contributory     Patient Active Problem List   Diagnosis Date Noted  . Special screening for malignant neoplasms, colon   . Benign neoplasm of transverse colon   . Fracture of right inferior pubic ramus ([place]) 09/17/2014  . Compression fracture of L3 lumbar vertebra ([place]) 09/17/2014    Past Surgical History  Procedure Laterality Date  . Abdominal hysterectomy    . Replacement total knee    . Gastric bypass      X2  . Hemoroid    . Cosmetic surgery    . Knee arthroscopy    . Joint replacement    . Colonoscopy with propofol N/A 07/18/2015    Procedure: COLONOSCOPY WITH PROPOFOL;  Surgeon: [name], MD;  Location: [facility] ENDOSCOPY;  Service: Endoscopy;  Laterality: N/A;    Current Outpatient Rx  Name  Route  Sig   Dispense  Refill  . ALPRAZolam (XANAX) 0.5 MG tablet   Oral   Take 0.5 mg by mouth at bedtime as needed for anxiety. Reported on 05/31/2015         . aspirin 81 MG tablet   Oral   Take 81 mg by mouth daily.         Marland Kitchen BIOTIN PO   Oral   Take 1 capsule by mouth daily.         Marland Kitchen CALCIUM CARBONATE PO   Oral   Take 1 tablet by mouth 2 (two) times daily.         . cholecalciferol (VITAMIN D) 1000 UNITS tablet   Oral   Take 1,000 Units by mouth daily.         . DiphenhydrAMINE HCl (ZZZQUIL) 50 MG/30ML LIQD   Oral   Take 30 mLs by mouth at bedtime as needed (for sleep).         Marland Kitchen estrogens, conjugated, (PREMARIN) 0.625 MG tablet   Oral   Take 0.625 mg by mouth daily. Take daily for 21 days then do not take for 7 days.         Marland Kitchen HYDROcodone-acetaminophen (NORCO/VICODIN) 5-325 MG per tablet   Oral   Take 1-2 tablets by mouth every 4 (four) hours as needed for moderate pain.   30 tablet   0   . ibandronate (BONIVA)  150 MG tablet   Oral   Take 150 mg by mouth every 30 (thirty) days. Take in the morning with a full glass of water, on an empty stomach, and do not take anything else by mouth or lie down for the next 30 min.         . Multiple Vitamins-Minerals (MULTIVITAMIN PO)   Oral   Take 1 tablet by mouth 2 (two) times daily.         . ondansetron (ZOFRAN) 4 MG tablet   Oral   Take 1 tablet (4 mg total) by mouth every 6 (six) hours as needed for nausea. Patient not taking: Reported on 05/31/2015   20 tablet   0   . Probiotic Product (PROBIOTIC PO)   Oral   Take 1 capsule by mouth daily.           Allergies Penicillins  Family History  Problem Relation Age of Onset  . Liver disease Mother   . Heart attack Father   . Cancer Father   . CAD Sister     Social History Social History  Substance Use Topics  . Smoking status: Former Research scientist (life sciences)  . Smokeless tobacco: Never Used  . Alcohol Use: 1.8 oz/week    3 Glasses of wine per week    Review of  Systems Constitutional: No fever/chills Eyes: No visual changes. ENT: No sore throat. Cardiovascular: Denies chest pain. Respiratory: Denies shortness of breath. Gastrointestinal: No abdominal pain.  No nausea, no vomiting.  No diarrhea.  No constipation. Genitourinary: Negative for dysuria. Musculoskeletal: Negative for back pain. Skin: Negative for rash. Neurological: Negative for focal weakness or numbness.  10-point ROS otherwise negative.  ____________________________________________   PHYSICAL EXAM:  VITAL SIGNS: ED Triage Vitals  Enc Vitals Group     BP 08/01/15 1528 180/98 mmHg     Pulse Rate 08/01/15 1528 71     Resp 08/01/15 1528 18     Temp 08/01/15 1528 98.1 F (36.7 C)     Temp Source 08/01/15 1528 Oral     SpO2 08/01/15 1528 95 %     Weight 08/01/15 1528 160 lb (72.576 kg)     Height 08/01/15 1528 5\' 6"  (1.676 m)     Head Cir --      Peak Flow --      Pain Score 08/01/15 1529 3     Pain Loc --      Pain Edu? --      Excl. in [place]? --     Constitutional: Alert and oriented. Well appearing and in no acute distress. Eyes: Conjunctivae are normal. PERRL. EOMI. Head: 2-3 cm minimally raised hematoma to the left parieto-occipital region. There is no breakage of the skin. Nose: No congestion/rhinnorhea. Mouth/Throat: Mucous membranes are moist.  Oropharynx non-erythematous. Neck: No stridor.  No tenderness to the midline C-spine. There is no deformity or step-off. Cardiovascular: Normal rate, regular rhythm. Grossly normal heart sounds.  Good peripheral circulation. Respiratory: Normal respiratory effort.  No retractions. Lungs CTAB. Gastrointestinal: Soft and nontender. No distention.  No CVA tenderness. Musculoskeletal: No lower extremity tenderness nor edema.  No joint effusions. 5 out of 5 strength to bilateral lower extremities. Mild tenderness to the left superior aspect of the buttock without any ecchymosis. No tenderness to the midline lumbar spine. There  is no deformity or step-off. Neurologic:  Normal speech and language. No gross focal neurologic deficits are appreciated. Skin:  Skin is warm, dry and intact. No rash noted. Psychiatric:  Mood and affect are normal. Speech and behavior are normal.  ____________________________________________   LABS (all labs ordered are listed, but only abnormal results are displayed)  Labs Reviewed - No data to display ____________________________________________  EKG   ____________________________________________  RADIOLOGY  Lab Results    None    Imaging Results       DG Hip Unilat With Pelvis 2-3 Views Left (Final result) Result time: 08/01/15 17:31:08   Final result by Rad Results In Interface (08/01/15 17:31:08)   Narrative:   CLINICAL DATA: Fall today with left hip pain, initial encounter  EXAM: DG HIP (WITH OR WITHOUT PELVIS) 2-3V LEFT  COMPARISON: None.  FINDINGS: The pelvic ring is intact. Mild degenerative changes of the hip joints are noted bilaterally. No findings to suggest acute fracture or dislocation are noted. No soft tissue changes are seen.  IMPRESSION: Degenerative change without acute abnormality.   Electronically Signed By: Inez Catalina M.D. On: 08/01/2015 17:31          CT Head Wo Contrast (Final result) Result time: 08/01/15 16:24:54   Final result by Rad Results In Interface (08/01/15 16:24:54)   Narrative:   CLINICAL DATA: Fall out of call are with headaches, initial encounter  EXAM: CT HEAD WITHOUT CONTRAST  TECHNIQUE: Contiguous axial images were obtained from the base of the skull through the vertex without intravenous contrast.  COMPARISON: None.  FINDINGS: The bony calvarium is intact. Mild atrophic changes are noted commenced with the patient's given age. No findings to suggest acute hemorrhage, acute infarction or space-occupying mass lesion are noted.  IMPRESSION: No acute abnormality  seen.   Electronically Signed By: Inez Catalina M.D. On: 08/01/2015 16:24       ____________________________________________   PROCEDURES    ____________________________________________   INITIAL IMPRESSION / ASSESSMENT AND PLAN / ED COURSE  Pertinent labs & imaging results that were available during my care of the patient were reviewed by me and considered in my medical decision making (see chart for details).  ----------------------------------------- 5:40 PM on 08/01/2015 -----------------------------------------  Patient without any findings of acute injury on her imaging. She is resting comfortably in her hallway bed and says she would like to go home. We discussed home treatment plans including ice to the injuries over the next 2 days and then to switch to heat. I also recommend icy hot. The patient is understanding of the plan and willing to comply.  We discussed return precautions such as any worsening or concerning symptoms especially worsening headache, nausea vomiting or dizziness. ____________________________________________   FINAL CLINICAL IMPRESSION(S) / ED DIAGNOSES  Contusion to the head as well as the left buttock.    Orbie Pyo, MD 08/01/15 [phone]

## 2015-08-01 NOTE — ED Notes (Signed)
Patient transported to X-ray 

## 2015-09-12 DIAGNOSIS — Z779 Other contact with and (suspected) exposures hazardous to health: Secondary | ICD-10-CM | POA: Diagnosis not present

## 2015-09-12 DIAGNOSIS — Z01419 Encounter for gynecological examination (general) (routine) without abnormal findings: Secondary | ICD-10-CM | POA: Diagnosis not present

## 2015-09-21 DIAGNOSIS — N951 Menopausal and female climacteric states: Secondary | ICD-10-CM | POA: Diagnosis not present

## 2015-09-21 DIAGNOSIS — Z1231 Encounter for screening mammogram for malignant neoplasm of breast: Secondary | ICD-10-CM | POA: Diagnosis not present

## 2015-11-07 DIAGNOSIS — M1711 Unilateral primary osteoarthritis, right knee: Secondary | ICD-10-CM | POA: Diagnosis not present

## 2015-11-07 DIAGNOSIS — M25561 Pain in right knee: Secondary | ICD-10-CM | POA: Diagnosis not present

## 2015-11-24 DIAGNOSIS — M25561 Pain in right knee: Secondary | ICD-10-CM | POA: Diagnosis not present

## 2015-11-27 DIAGNOSIS — Z01818 Encounter for other preprocedural examination: Secondary | ICD-10-CM | POA: Diagnosis not present

## 2015-11-27 DIAGNOSIS — M47816 Spondylosis without myelopathy or radiculopathy, lumbar region: Secondary | ICD-10-CM | POA: Diagnosis not present

## 2015-11-27 DIAGNOSIS — K219 Gastro-esophageal reflux disease without esophagitis: Secondary | ICD-10-CM | POA: Diagnosis not present

## 2015-11-27 DIAGNOSIS — M1711 Unilateral primary osteoarthritis, right knee: Secondary | ICD-10-CM | POA: Diagnosis not present

## 2015-11-27 DIAGNOSIS — Z23 Encounter for immunization: Secondary | ICD-10-CM | POA: Diagnosis not present

## 2015-11-29 DIAGNOSIS — Z23 Encounter for immunization: Secondary | ICD-10-CM | POA: Diagnosis not present

## 2015-11-30 ENCOUNTER — Encounter
Admission: RE | Admit: 2015-11-30 | Discharge: 2015-11-30 | Disposition: A | Discharge status: Home / Self Care | Payer: Medicare Other | Source: Ambulatory Visit | Attending: Orthopedic Surgery | Admitting: Orthopedic Surgery

## 2015-11-30 DIAGNOSIS — Z96659 Presence of unspecified artificial knee joint: Secondary | ICD-10-CM | POA: Diagnosis not present

## 2015-11-30 DIAGNOSIS — Z01812 Encounter for preprocedural laboratory examination: Secondary | ICD-10-CM | POA: Diagnosis not present

## 2015-11-30 HISTORY — DX: Unspecified osteoarthritis, unspecified site: M19.90

## 2015-11-30 HISTORY — DX: Age-related osteoporosis without current pathological fracture: M81.0

## 2015-11-30 HISTORY — DX: Dorsalgia, unspecified: M54.9

## 2015-11-30 HISTORY — DX: Insomnia, unspecified: G47.00

## 2015-11-30 LAB — CBC
HEMATOCRIT: 43 % (ref 35.0–47.0)
Hemoglobin: 14.8 g/dL (ref 12.0–16.0)
MCH: 35.2 pg — ABNORMAL HIGH (ref 26.0–34.0)
MCHC: 34.4 g/dL (ref 32.0–36.0)
MCV: 102.2 fL — ABNORMAL HIGH (ref 80.0–100.0)
PLATELETS: 154 10*3/uL (ref 150–440)
RBC: 4.2 MIL/uL (ref 3.80–5.20)
RDW: 13.5 % (ref 11.5–14.5)
WBC: 6.1 10*3/uL (ref 3.6–11.0)

## 2015-11-30 LAB — TYPE AND SCREEN
ABO/RH(D): A POS
ANTIBODY SCREEN: NEGATIVE

## 2015-11-30 LAB — COMPREHENSIVE METABOLIC PANEL
ALBUMIN: 3.6 g/dL (ref 3.5–5.0)
ALT: 33 U/L (ref 14–54)
ANION GAP: 7 (ref 5–15)
AST: 68 U/L — AB (ref 15–41)
Alkaline Phosphatase: 83 U/L (ref 38–126)
BUN: 13 mg/dL (ref 6–20)
CHLORIDE: 103 mmol/L (ref 101–111)
CO2: 32 mmol/L (ref 22–32)
Calcium: 8.2 mg/dL — ABNORMAL LOW (ref 8.9–10.3)
Creatinine, Ser: 0.7 mg/dL (ref 0.44–1.00)
GFR calc Af Amer: >60 mL/min (ref >60)
GFR calc non Af Amer: >60 mL/min (ref >60)
GLUCOSE: 88 mg/dL (ref 65–99)
POTASSIUM: 3.4 mmol/L — AB (ref 3.5–5.1)
SODIUM: 142 mmol/L (ref 135–145)
Total Bilirubin: 1.3 mg/dL — ABNORMAL HIGH (ref 0.3–1.2)
Total Protein: 6 g/dL — ABNORMAL LOW (ref 6.5–8.1)

## 2015-11-30 LAB — PROTIME-INR
INR: 0.95
Prothrombin Time: 12.7 seconds (ref 11.4–15.2)

## 2015-11-30 LAB — URINALYSIS COMPLETE WITH MICROSCOPIC (ARMC ONLY)
BILIRUBIN URINE: NEGATIVE
GLUCOSE, UA: NEGATIVE mg/dL
HGB URINE DIPSTICK: NEGATIVE
Ketones, ur: NEGATIVE mg/dL
Leukocytes, UA: NEGATIVE
NITRITE: NEGATIVE
Protein, ur: 30 mg/dL — AB
SPECIFIC GRAVITY, URINE: 1.018 (ref 1.005–1.030)
pH: 6 (ref 5.0–8.0)

## 2015-11-30 LAB — MRSA PCR SCREENING: MRSA BY PCR: NEGATIVE

## 2015-11-30 LAB — APTT: APTT: 30 s (ref 24–36)

## 2015-11-30 LAB — SEDIMENTATION RATE: SED RATE: 3 mm/h (ref 0–30)

## 2015-11-30 NOTE — Pre-Procedure Instructions (Signed)
Cardiac clearance by Dr Lavera Guise on chart.

## 2015-11-30 NOTE — Patient Instructions (Signed)
  Your procedure is scheduled on:12/13/15 Wed Report to Same Day Surgery 2nd floor medical mall To find out your arrival time please call [phone] between 1PM - 3PM on 12/12/15 Tues  Remember: Instructions that are not followed completely may result in serious medical risk, up to and including death, or upon the discretion of your surgeon and anesthesiologist your surgery may need to be rescheduled.    _x___ 1. Do not eat food or drink liquids after midnight. No gum chewing or hard candies.     __x__ 2. No Alcohol for 24 hours before or after surgery.   __x__3. No Smoking for 24 prior to surgery.   ____  4. Bring all medications with you on the day of surgery if instructed.    __x__ 5. Notify your doctor if there is any change in your medical condition     (cold, fever, infections).     Do not wear jewelry, make-up, hairpins, clips or nail polish.  Do not wear lotions, powders, or perfumes. You may wear deodorant.  Do not shave 48 hours prior to surgery. Men may shave face and neck.  Do not bring valuables to the hospital.    [facility] is not responsible for any belongings or valuables.               Contacts, dentures or bridgework may not be worn into surgery.  Leave your suitcase in the car. After surgery it may be brought to your room.  For patients admitted to the hospital, discharge time is determined by your treatment team.   Patients discharged the day of surgery will not be allowed to drive home.    Please read over the following fact sheets that you were given:   [facility] Preparing for Surgery and or MRSA Information   _x___ Take these medicines the morning of surgery with A SIP OF WATER:    1.   2.  3.  4.  5.  6.  ____ Fleet Enema (as directed)   _x___ Use CHG Soap or sage wipes as directed on instruction sheet   ____ Use inhalers on the day of surgery and bring to hospital day of surgery  ____ Stop metformin 2 days prior to surgery    ____ Take 1/2  of usual insulin dose the night before surgery and none on the morning of           surgery.   _x___ Stop aspirin or coumadin, or plavix  Stop aspirin 1 week before surgery  _x__ Stop Anti-inflammatories such as Advil, Aleve, Ibuprofen, Motrin, Naproxen,          Naprosyn, Goodies powders or aspirin products. Ok to take Tylenol.   ____ Stop supplements until after surgery.    ____ Bring C-Pap to the hospital.

## 2015-12-01 LAB — URINE CULTURE
Culture: <10000 — AB
Special Requests: NORMAL

## 2015-12-13 ENCOUNTER — Encounter
Admission: RE | Disposition: A | Discharge status: Home Health | Payer: Self-pay | Source: Ambulatory Visit | Attending: Orthopedic Surgery

## 2015-12-13 ENCOUNTER — Inpatient Hospital Stay
Admission: RE | Admit: 2015-12-13 | Discharge: 2015-12-15 | DRG: 470 | Disposition: A | Discharge status: Home Health | Payer: Medicare Other | Source: Ambulatory Visit | Attending: Orthopedic Surgery | Admitting: Orthopedic Surgery

## 2015-12-13 ENCOUNTER — Inpatient Hospital Stay: Payer: Medicare Other | Admitting: Anesthesiology

## 2015-12-13 ENCOUNTER — Encounter: Payer: Self-pay | Admitting: *Deleted

## 2015-12-13 ENCOUNTER — Inpatient Hospital Stay: Payer: Medicare Other

## 2015-12-13 DIAGNOSIS — M1711 Unilateral primary osteoarthritis, right knee: Principal | ICD-10-CM | POA: Diagnosis present

## 2015-12-13 DIAGNOSIS — G4700 Insomnia, unspecified: Secondary | ICD-10-CM | POA: Diagnosis present

## 2015-12-13 DIAGNOSIS — M25561 Pain in right knee: Secondary | ICD-10-CM | POA: Diagnosis not present

## 2015-12-13 DIAGNOSIS — Z471 Aftercare following joint replacement surgery: Secondary | ICD-10-CM | POA: Diagnosis not present

## 2015-12-13 DIAGNOSIS — M810 Age-related osteoporosis without current pathological fracture: Secondary | ICD-10-CM | POA: Diagnosis present

## 2015-12-13 DIAGNOSIS — Z96659 Presence of unspecified artificial knee joint: Secondary | ICD-10-CM

## 2015-12-13 DIAGNOSIS — J449 Chronic obstructive pulmonary disease, unspecified: Secondary | ICD-10-CM | POA: Diagnosis not present

## 2015-12-13 DIAGNOSIS — Z96651 Presence of right artificial knee joint: Secondary | ICD-10-CM | POA: Diagnosis not present

## 2015-12-13 DIAGNOSIS — E876 Hypokalemia: Secondary | ICD-10-CM | POA: Diagnosis present

## 2015-12-13 DIAGNOSIS — M179 Osteoarthritis of knee, unspecified: Secondary | ICD-10-CM | POA: Diagnosis not present

## 2015-12-13 HISTORY — PX: KNEE ARTHROPLASTY: SHX992

## 2015-12-13 LAB — ABO/RH: ABO/RH(D): A POS

## 2015-12-13 SURGERY — ARTHROPLASTY, KNEE, TOTAL, USING IMAGELESS COMPUTER-ASSISTED NAVIGATION
Anesthesia: Spinal | Site: Knee | Laterality: Right | Wound class: Clean

## 2015-12-13 MED ORDER — NEOMYCIN-POLYMYXIN B GU 40-200000 IR SOLN
Status: AC
  Filled 2015-12-13: qty 20

## 2015-12-13 MED ORDER — ADULT MULTIVITAMIN W/MINERALS CH
1.0000 | ORAL_TABLET | Freq: Every day | ORAL | Status: DC
  Administered 2015-12-14 – 2015-12-15 (×2): 1 via ORAL
  Filled 2015-12-13 (×2): qty 1

## 2015-12-13 MED ORDER — PANTOPRAZOLE SODIUM 40 MG PO TBEC
40.0000 mg | DELAYED_RELEASE_TABLET | Freq: Two times a day (BID) | ORAL | Status: DC
  Administered 2015-12-13 – 2015-12-15 (×4): 40 mg via ORAL
  Filled 2015-12-13 (×4): qty 1

## 2015-12-13 MED ORDER — CHLORHEXIDINE GLUCONATE 4 % EX LIQD: 60.0000 mL | Freq: Once | CUTANEOUS | Status: DC

## 2015-12-13 MED ORDER — MORPHINE SULFATE (PF) 2 MG/ML IV SOLN
2.0000 mg | INTRAVENOUS | Status: DC | PRN
  Administered 2015-12-13: 2 mg via INTRAVENOUS
  Filled 2015-12-13: qty 1

## 2015-12-13 MED ORDER — ALUM & MAG HYDROXIDE-SIMETH 200-200-20 MG/5ML PO SUSP: 30.0000 mL | ORAL | Status: DC | PRN

## 2015-12-13 MED ORDER — NEOMYCIN-POLYMYXIN B GU 40-200000 IR SOLN
Status: DC | PRN
  Administered 2015-12-13: 14 mL

## 2015-12-13 MED ORDER — PROPOFOL 500 MG/50ML IV EMUL
INTRAVENOUS | Status: DC | PRN
  Administered 2015-12-13: 75 ug/kg/min via INTRAVENOUS

## 2015-12-13 MED ORDER — CLINDAMYCIN PHOSPHATE 900 MG/50ML IV SOLN
900.0000 mg | INTRAVENOUS | Status: AC
  Administered 2015-12-13: 900 mg via INTRAVENOUS

## 2015-12-13 MED ORDER — BUPIVACAINE LIPOSOME 1.3 % IJ SUSP
INTRAMUSCULAR | Status: AC
  Filled 2015-12-13: qty 20

## 2015-12-13 MED ORDER — METOCLOPRAMIDE HCL 10 MG PO TABS
10.0000 mg | ORAL_TABLET | Freq: Three times a day (TID) | ORAL | Status: AC
  Administered 2015-12-13 – 2015-12-15 (×7): 10 mg via ORAL
  Filled 2015-12-13 (×8): qty 1

## 2015-12-13 MED ORDER — ONDANSETRON HCL 4 MG/2ML IJ SOLN
INTRAMUSCULAR | Status: DC | PRN
Start: 2015-12-13 — End: 2015-12-13
  Administered 2015-12-13: 4 mg via INTRAVENOUS

## 2015-12-13 MED ORDER — FERROUS SULFATE 325 (65 FE) MG PO TABS
325.0000 mg | ORAL_TABLET | Freq: Two times a day (BID) | ORAL | Status: DC
  Administered 2015-12-13 – 2015-12-15 (×4): 325 mg via ORAL
  Filled 2015-12-13 (×4): qty 1

## 2015-12-13 MED ORDER — FENTANYL CITRATE (PF) 100 MCG/2ML IJ SOLN: 25.0000 ug | INTRAMUSCULAR | Status: DC | PRN

## 2015-12-13 MED ORDER — FAMOTIDINE 20 MG PO TABS
ORAL_TABLET | ORAL | Status: AC
  Administered 2015-12-13: 20 mg via ORAL
  Filled 2015-12-13: qty 1

## 2015-12-13 MED ORDER — B COMPLEX-C PO TABS
1.0000 | ORAL_TABLET | ORAL | Status: DC
  Administered 2015-12-14: 1 via ORAL
  Filled 2015-12-13: qty 1

## 2015-12-13 MED ORDER — MAGNESIUM HYDROXIDE 400 MG/5ML PO SUSP: 30.0000 mL | Freq: Every day | ORAL | Status: DC | PRN

## 2015-12-13 MED ORDER — BUPIVACAINE HCL (PF) 0.25 % IJ SOLN
INTRAMUSCULAR | Status: AC
  Filled 2015-12-13: qty 60

## 2015-12-13 MED ORDER — MORPHINE SULFATE (PF) 2 MG/ML IV SOLN: 1.0000 mg | INTRAVENOUS | Status: DC | PRN

## 2015-12-13 MED ORDER — SODIUM CHLORIDE 0.9 % IV SOLN: INTRAVENOUS | Status: DC | PRN

## 2015-12-13 MED ORDER — SODIUM CHLORIDE 0.9 % IJ SOLN
INTRAMUSCULAR | Status: AC
  Filled 2015-12-13: qty 50

## 2015-12-13 MED ORDER — ACETAMINOPHEN 10 MG/ML IV SOLN
1000.0000 mg | Freq: Four times a day (QID) | INTRAVENOUS | Status: AC
  Administered 2015-12-13 – 2015-12-14 (×4): 1000 mg via INTRAVENOUS
  Filled 2015-12-13 (×4): qty 100

## 2015-12-13 MED ORDER — OXYCODONE HCL 5 MG PO TABS
5.0000 mg | ORAL_TABLET | ORAL | Status: DC | PRN
  Administered 2015-12-13 – 2015-12-15 (×9): 10 mg via ORAL
  Filled 2015-12-13 (×9): qty 2

## 2015-12-13 MED ORDER — MENTHOL 3 MG MT LOZG
1.0000 | LOZENGE | OROMUCOSAL | Status: DC | PRN
  Filled 2015-12-13: qty 9

## 2015-12-13 MED ORDER — SODIUM CHLORIDE FLUSH 0.9 % IV SOLN
INTRAVENOUS | Status: AC
  Filled 2015-12-13: qty 10

## 2015-12-13 MED ORDER — ENOXAPARIN SODIUM 30 MG/0.3ML ~~LOC~~ SOLN
30.0000 mg | Freq: Two times a day (BID) | SUBCUTANEOUS | Status: DC
  Administered 2015-12-14 – 2015-12-15 (×3): 30 mg via SUBCUTANEOUS
  Filled 2015-12-13 (×3): qty 0.3

## 2015-12-13 MED ORDER — CALCIUM CARBONATE 1250 (500 CA) MG PO TABS
1.0000 | ORAL_TABLET | Freq: Every day | ORAL | Status: DC
  Filled 2015-12-13: qty 1

## 2015-12-13 MED ORDER — PHENYLEPHRINE HCL 10 MG/ML IJ SOLN
INTRAMUSCULAR | Status: DC | PRN
  Administered 2015-12-13: 50 ug via INTRAVENOUS
  Administered 2015-12-13 (×3): 100 ug via INTRAVENOUS
  Administered 2015-12-13: 50 ug via INTRAVENOUS
  Administered 2015-12-13 (×2): 100 ug via INTRAVENOUS

## 2015-12-13 MED ORDER — PHENOL 1.4 % MT LIQD
1.0000 | OROMUCOSAL | Status: DC | PRN
  Filled 2015-12-13: qty 177

## 2015-12-13 MED ORDER — CLINDAMYCIN PHOSPHATE 900 MG/50ML IV SOLN
INTRAVENOUS | Status: AC
  Filled 2015-12-13: qty 50

## 2015-12-13 MED ORDER — ACETAMINOPHEN 650 MG RE SUPP: 650.0000 mg | Freq: Four times a day (QID) | RECTAL | Status: DC | PRN

## 2015-12-13 MED ORDER — OXYCODONE HCL 5 MG PO TABS: 5.0000 mg | ORAL_TABLET | Freq: Once | ORAL | Status: DC | PRN

## 2015-12-13 MED ORDER — OXYCODONE HCL 5 MG PO TABS
5.0000 mg | ORAL_TABLET | ORAL | Status: DC | PRN
  Administered 2015-12-13 (×2): 5 mg via ORAL
  Filled 2015-12-13 (×2): qty 1

## 2015-12-13 MED ORDER — LIDOCAINE HCL (CARDIAC) 20 MG/ML IV SOLN
INTRAVENOUS | Status: DC | PRN
  Administered 2015-12-13: 30 mg via INTRAVENOUS

## 2015-12-13 MED ORDER — IBANDRONATE SODIUM 150 MG PO TABS: 150.0000 mg | ORAL_TABLET | ORAL | Status: DC

## 2015-12-13 MED ORDER — FENTANYL CITRATE (PF) 100 MCG/2ML IJ SOLN
INTRAMUSCULAR | Status: DC | PRN
  Administered 2015-12-13: 25 ug via INTRAVENOUS
  Administered 2015-12-13: 50 ug via INTRAVENOUS
  Administered 2015-12-13: 25 ug via INTRAVENOUS

## 2015-12-13 MED ORDER — SODIUM CHLORIDE 0.9 % IV SOLN
INTRAVENOUS | Status: DC
  Administered 2015-12-13 – 2015-12-14 (×2): via INTRAVENOUS

## 2015-12-13 MED ORDER — DIPHENHYDRAMINE HCL 12.5 MG/5ML PO ELIX: 12.5000 mg | ORAL_SOLUTION | ORAL | Status: DC | PRN

## 2015-12-13 MED ORDER — PROPOFOL 10 MG/ML IV BOLUS
INTRAVENOUS | Status: DC | PRN
  Administered 2015-12-13: 10 mg via INTRAVENOUS

## 2015-12-13 MED ORDER — OXYCODONE HCL 5 MG/5ML PO SOLN
5.0000 mg | Freq: Once | ORAL | Status: DC | PRN
Start: 2015-12-13 — End: 2015-12-13

## 2015-12-13 MED ORDER — ACETAMINOPHEN 10 MG/ML IV SOLN
INTRAVENOUS | Status: AC
  Filled 2015-12-13: qty 100

## 2015-12-13 MED ORDER — TRANEXAMIC ACID 1000 MG/10ML IV SOLN
1000.0000 mg | INTRAVENOUS | Status: AC
  Administered 2015-12-13: 1000 mg via INTRAVENOUS
  Filled 2015-12-13: qty 10

## 2015-12-13 MED ORDER — BUPIVACAINE HCL (PF) 0.25 % IJ SOLN
INTRAMUSCULAR | Status: DC | PRN
  Administered 2015-12-13: 60 mL

## 2015-12-13 MED ORDER — CLINDAMYCIN PHOSPHATE 600 MG/50ML IV SOLN
600.0000 mg | Freq: Four times a day (QID) | INTRAVENOUS | Status: AC
  Administered 2015-12-13 – 2015-12-14 (×4): 600 mg via INTRAVENOUS
  Filled 2015-12-13 (×4): qty 50

## 2015-12-13 MED ORDER — MIDAZOLAM HCL 5 MG/5ML IJ SOLN
INTRAMUSCULAR | Status: DC | PRN
  Administered 2015-12-13 (×2): 1 mg via INTRAVENOUS

## 2015-12-13 MED ORDER — LACTATED RINGERS IV SOLN
INTRAVENOUS | Status: DC
  Administered 2015-12-13: 07:00:00 via INTRAVENOUS

## 2015-12-13 MED ORDER — FLEET ENEMA 7-19 GM/118ML RE ENEM: 1.0000 | ENEMA | Freq: Once | RECTAL | Status: DC | PRN

## 2015-12-13 MED ORDER — RISAQUAD PO CAPS
2.0000 | ORAL_CAPSULE | Freq: Every day | ORAL | Status: DC
  Administered 2015-12-14 – 2015-12-15 (×2): 2 via ORAL
  Filled 2015-12-13 (×3): qty 2

## 2015-12-13 MED ORDER — SENNOSIDES-DOCUSATE SODIUM 8.6-50 MG PO TABS
1.0000 | ORAL_TABLET | Freq: Two times a day (BID) | ORAL | Status: DC
  Administered 2015-12-13 – 2015-12-15 (×4): 1 via ORAL
  Filled 2015-12-13 (×4): qty 1

## 2015-12-13 MED ORDER — BISACODYL 10 MG RE SUPP: 10.0000 mg | Freq: Every day | RECTAL | Status: DC | PRN

## 2015-12-13 MED ORDER — FAMOTIDINE 20 MG PO TABS
20.0000 mg | ORAL_TABLET | Freq: Once | ORAL | Status: AC
  Administered 2015-12-13: 20 mg via ORAL

## 2015-12-13 MED ORDER — TETRACAINE HCL 1 % IJ SOLN
INTRAMUSCULAR | Status: AC
  Filled 2015-12-13: qty 2

## 2015-12-13 MED ORDER — ACETAMINOPHEN 10 MG/ML IV SOLN
INTRAVENOUS | Status: DC | PRN
  Administered 2015-12-13: 1000 mg via INTRAVENOUS

## 2015-12-13 MED ORDER — SODIUM CHLORIDE 0.9 % IV SOLN
1000.0000 mg | Freq: Once | INTRAVENOUS | Status: AC
  Administered 2015-12-13: 1000 mg via INTRAVENOUS
  Filled 2015-12-13: qty 10

## 2015-12-13 MED ORDER — ACETAMINOPHEN 325 MG PO TABS: 650.0000 mg | ORAL_TABLET | Freq: Four times a day (QID) | ORAL | Status: DC | PRN

## 2015-12-13 MED ORDER — ZOLPIDEM TARTRATE 5 MG PO TABS
5.0000 mg | ORAL_TABLET | Freq: Every evening | ORAL | Status: DC | PRN
Start: 2015-12-13 — End: 2015-12-15
  Administered 2015-12-13 – 2015-12-14 (×2): 5 mg via ORAL
  Filled 2015-12-13 (×2): qty 1

## 2015-12-13 MED ORDER — VITAMIN D 1000 UNITS PO TABS
2000.0000 [IU] | ORAL_TABLET | Freq: Every day | ORAL | Status: DC
  Administered 2015-12-14 – 2015-12-15 (×2): 2000 [IU] via ORAL
  Filled 2015-12-13 (×2): qty 2

## 2015-12-13 MED ORDER — HYDROMORPHONE HCL 1 MG/ML IJ SOLN
0.5000 mg | Freq: Once | INTRAMUSCULAR | Status: AC
  Administered 2015-12-13: 0.5 mg via INTRAVENOUS
  Filled 2015-12-13: qty 1

## 2015-12-13 MED ORDER — ONDANSETRON HCL 4 MG/2ML IJ SOLN: 4.0000 mg | Freq: Four times a day (QID) | INTRAMUSCULAR | Status: DC | PRN

## 2015-12-13 MED ORDER — ONDANSETRON HCL 4 MG PO TABS: 4.0000 mg | ORAL_TABLET | Freq: Four times a day (QID) | ORAL | Status: DC | PRN

## 2015-12-13 MED ORDER — ESTROGENS CONJUGATED 0.625 MG PO TABS
0.6250 mg | ORAL_TABLET | Freq: Every day | ORAL | Status: DC
  Administered 2015-12-14 – 2015-12-15 (×2): 0.625 mg via ORAL
  Filled 2015-12-13 (×3): qty 1

## 2015-12-13 MED ORDER — SODIUM CHLORIDE 0.9 % IV SOLN
INTRAVENOUS | Status: DC | PRN
  Administered 2015-12-13: 60 mL

## 2015-12-13 MED ORDER — TRAMADOL HCL 50 MG PO TABS
50.0000 mg | ORAL_TABLET | ORAL | Status: DC | PRN
  Administered 2015-12-14 (×2): 50 mg via ORAL
  Administered 2015-12-15: 100 mg via ORAL
  Filled 2015-12-13 (×2): qty 1
  Filled 2015-12-13: qty 2

## 2015-12-13 SURGICAL SUPPLY — 58 items
AUTOTRANSFUS HAS 1/8 (MISCELLANEOUS) ×2
BATTERY INSTRU NAVIGATION (MISCELLANEOUS) ×8 IMPLANT
BLADE SAW 1 (BLADE) ×2 IMPLANT
BLADE SAW 1/2 (BLADE) ×2 IMPLANT
CANISTER SUCT 1200ML W/VALVE (MISCELLANEOUS) ×2 IMPLANT
CANISTER SUCT 3000ML (MISCELLANEOUS) ×4 IMPLANT
CAPT KNEE TOTAL 3 ATTUNE ×2 IMPLANT
CATH TRAY METER 16FR LF (MISCELLANEOUS) ×2 IMPLANT
CEMENT HV SMART SET (Cement) ×4 IMPLANT
COOLER POLAR GLACIER W/PUMP (MISCELLANEOUS) ×2 IMPLANT
CUFF TOURN 24 STER (MISCELLANEOUS) IMPLANT
CUFF TOURN 30 STER DUAL PORT (MISCELLANEOUS) ×2 IMPLANT
DRAPE SHEET LG 3/4 BI-LAMINATE (DRAPES) ×2 IMPLANT
DRSG DERMACEA 8X12 NADH (GAUZE/BANDAGES/DRESSINGS) ×2 IMPLANT
DRSG OPSITE POSTOP 4X14 (GAUZE/BANDAGES/DRESSINGS) ×2 IMPLANT
DRSG TEGADERM 4X4.75 (GAUZE/BANDAGES/DRESSINGS) ×2 IMPLANT
DURAPREP 26ML APPLICATOR (WOUND CARE) ×4 IMPLANT
ELECT CAUTERY BLADE 6.4 (BLADE) ×2 IMPLANT
ELECT REM PT RETURN 9FT ADLT (ELECTROSURGICAL) ×2
ELECTRODE REM PT RTRN 9FT ADLT (ELECTROSURGICAL) ×1 IMPLANT
EX-PIN ORTHOLOCK NAV 4X150 (PIN) ×4 IMPLANT
GLOVE BIOGEL M STRL SZ7.5 (GLOVE) ×4 IMPLANT
GLOVE INDICATOR 8.0 STRL GRN (GLOVE) ×2 IMPLANT
GLOVE SURG 9.0 ORTHO LTXF (GLOVE) ×2 IMPLANT
GLOVE SURG ORTHO 9.0 STRL STRW (GLOVE) ×2 IMPLANT
GOWN STRL REUS W/ TWL LRG LVL3 (GOWN DISPOSABLE) ×2 IMPLANT
GOWN STRL REUS W/TWL 2XL LVL3 (GOWN DISPOSABLE) ×2 IMPLANT
GOWN STRL REUS W/TWL LRG LVL3 (GOWN DISPOSABLE) ×2
HANDPIECE INTERPULSE COAX TIP (DISPOSABLE) ×1
HOLDER FOLEY CATH W/STRAP (MISCELLANEOUS) ×2 IMPLANT
HOOD PEEL AWAY FLYTE STAYCOOL (MISCELLANEOUS) ×4 IMPLANT
KIT RM TURNOVER STRD PROC AR (KITS) ×2 IMPLANT
KNIFE SCULPS 14X20 (INSTRUMENTS) ×2 IMPLANT
LABEL OR SOLS (LABEL) ×2 IMPLANT
NDL SAFETY 18GX1.5 (NEEDLE) ×2 IMPLANT
NEEDLE SPNL 20GX3.5 QUINCKE YW (NEEDLE) ×2 IMPLANT
NS IRRIG 500ML POUR BTL (IV SOLUTION) ×2 IMPLANT
PACK TOTAL KNEE (MISCELLANEOUS) ×2 IMPLANT
PAD WRAPON POLAR KNEE (MISCELLANEOUS) ×1 IMPLANT
PIN FIXATION 1/8DIA X 3INL (PIN) ×2 IMPLANT
SET HNDPC FAN SPRY TIP SCT (DISPOSABLE) ×1 IMPLANT
SOL .9 NS 3000ML IRR  AL (IV SOLUTION) ×1
SOL .9 NS 3000ML IRR UROMATIC (IV SOLUTION) ×1 IMPLANT
SOL PREP PVP 2OZ (MISCELLANEOUS) ×2
SOLUTION PREP PVP 2OZ (MISCELLANEOUS) ×1 IMPLANT
SPONGE DRAIN TRACH 4X4 STRL 2S (GAUZE/BANDAGES/DRESSINGS) ×2 IMPLANT
STAPLER SKIN PROX 35W (STAPLE) ×2 IMPLANT
SUCTION FRAZIER HANDLE 10FR (MISCELLANEOUS) ×1
SUCTION TUBE FRAZIER 10FR DISP (MISCELLANEOUS) ×1 IMPLANT
SUT VIC AB 0 CT1 36 (SUTURE) ×2 IMPLANT
SUT VIC AB 1 CT1 36 (SUTURE) ×4 IMPLANT
SUT VIC AB 2-0 CT2 27 (SUTURE) ×2 IMPLANT
SYR 20CC LL (SYRINGE) ×2 IMPLANT
SYR 30ML LL (SYRINGE) ×4 IMPLANT
SYSTEM AUTOTRANSFUS DUAL TROCR (MISCELLANEOUS) ×1 IMPLANT
TOWEL OR 17X26 4PK STRL BLUE (TOWEL DISPOSABLE) ×2 IMPLANT
TOWER CARTRIDGE SMART MIX (DISPOSABLE) ×2 IMPLANT
WRAPON POLAR PAD KNEE (MISCELLANEOUS) ×2

## 2015-12-13 NOTE — Transfer of Care (Signed)
Immediate Anesthesia Transfer of Care Note  Patient: Sonya Park  Procedure(s) Performed: Procedure(s): COMPUTER ASSISTED TOTAL KNEE ARTHROPLASTY (Right)  Patient Location: PACU  Anesthesia Type:Spinal  Level of Consciousness: awake and alert   Airway & Oxygen Therapy: Patient Spontanous Breathing and Patient connected to face mask oxygen  Post-op Assessment: Report given to RN and Post -op Vital signs reviewed and stable  Post vital signs: Reviewed and stable  Last Vitals:  Vitals:   12/13/15 0603  BP: (!) 146/93  Pulse: 75  Resp: 16  Temp: 36.6 C    Last Pain:  Vitals:   12/13/15 0603  TempSrc: Oral         Complications: No apparent anesthesia complications

## 2015-12-13 NOTE — Progress Notes (Signed)
PT Cancellation Note  Patient Details Name: Sonya Park MRN: [id number] DOB: 05/31/1940   Cancelled Treatment:    Reason Eval/Treat Not Completed: Other (comment) PT attempted evaluation for the second time this day. Pt assessed with decreased sensation below B knee level and demonstrates difficulty with movement initiation in B LEs. PT will return tomorrow to complete evaluation.    Riley Nearing, SPT 12/13/2015, 3:54 PM

## 2015-12-13 NOTE — Anesthesia Procedure Notes (Signed)
Spinal  Patient location during procedure: OR Start time: 12/13/2015 7:20 AM End time: 12/13/2015 7:25 AM Staffing Anesthesiologist: Andria Frames Resident/CRNA: Johnna Acosta Performed: resident/CRNA  Preanesthetic Checklist Completed: patient identified, site marked, surgical consent, pre-op evaluation, timeout performed, IV checked, risks and benefits discussed and monitors and equipment checked Spinal Block Patient position: sitting Prep: ChloraPrep Patient monitoring: heart rate, continuous pulse ox, blood pressure and cardiac monitor Approach: midline Location: L4-5 Injection technique: single-shot Needle Needle type: Whitacre and Introducer  Needle gauge: 24 G Needle length: 9 cm Assessment Sensory level: T10 Additional Notes Negative paresthesia. Negative blood return. Positive free-flowing CSF. Expiration date of kit checked and confirmed. Patient tolerated procedure well, without complications.

## 2015-12-13 NOTE — Progress Notes (Signed)

## 2015-12-13 NOTE — H&P (Signed)
The patient has been re-examined, and the chart reviewed, and there have been no interval changes to the documented history and physical.    The risks, benefits, and alternatives have been discussed at length. The patient expressed understanding of the risks benefits and agreed with plans for surgical intervention.  James P. Hooten, Jr. M.D.    

## 2015-12-13 NOTE — Anesthesia Procedure Notes (Signed)
Date/Time: 12/13/2015 7:27 AM Performed by: Johnna Acosta Pre-anesthesia Checklist: Patient identified, Emergency Drugs available, Suction available, Patient being monitored and Timeout performed Patient Re-evaluated:Patient Re-evaluated prior to inductionOxygen Delivery Method: Simple face mask Preoxygenation: Pre-oxygenation with 100% oxygen

## 2015-12-13 NOTE — Progress Notes (Signed)
Pt. Still c/o pain after receiving morphine and oxy. Pain scale 9 out of 10. Change oxy to q3h and morphine to q1h. Dilaudid 0.5mg  once.

## 2015-12-13 NOTE — Brief Op Note (Signed)
12/13/2015  10:46 AM  PATIENT:  Sonya Park  75 y.o. female  PRE-OPERATIVE DIAGNOSIS:  OSTEOARTHRITIS of the right knee  POST-OPERATIVE DIAGNOSIS:  Same  PROCEDURE:  Procedure(s): COMPUTER ASSISTED TOTAL KNEE ARTHROPLASTY (Right)  SURGEON:  Surgeon(s) and Role:    * Dereck Leep, MD - Primary  ASSISTANTS: Vance Peper, PA   ANESTHESIA:   spinal  EBL:  Total I/O In: 900 [I.V.:900] Out: 575 [Urine:550; Blood:25]  BLOOD ADMINISTERED:none  DRAINS: 2 medium drains to a reinfusion system   LOCAL MEDICATIONS USED:  MARCAINE    and OTHER Exparel  SPECIMEN:  No Specimen  DISPOSITION OF SPECIMEN:  N/A  COUNTS:  YES  TOURNIQUET:   92 minutes  DICTATION: .Dragon Dictation  PLAN OF CARE: Admit to inpatient   PATIENT DISPOSITION:  PACU - hemodynamically stable.   Delay start of Pharmacological VTE agent (>24hrs) due to surgical blood loss or risk of bleeding: yes

## 2015-12-13 NOTE — Progress Notes (Signed)
PT Cancellation Note  Patient Details Name: Sonya Park MRN: [id number] DOB: [birth year]   Cancelled Treatment:    Reason Eval/Treat Not Completed: Other (comment).  PT consult received.  Chart reviewed.  Pt assessed and does not have full sensation back in LE's.  Will re-attempt PT eval again later this afternoon.   Leitha Bleak 12/13/2015, 2:35 PM Leitha Bleak, [place]

## 2015-12-13 NOTE — NC FL2 (Signed)
[place]a [place] LEVEL OF CARE SCREENING TOOL     IDENTIFICATION  Patient Name: Sonya Park Birthdate: Sep 04, 1940 Sex: female Admission Date (Current Location): 12/13/2015  [place] Number:  [place] and Address:  [place], [address], [place], [place] [address]      Provider Number: [id number]  Attending Physician Name and Address:  Sonya Leep, MD  Relative Name and Phone Number:       Current Level of Care: [place] Recommended Level of Care: [place] Prior Approval Number:    Date Approved/Denied:   PASRR Number:  ([id number])  Discharge Plan: SNF    Current Diagnoses: Patient Active Problem List   Diagnosis Date Noted  . 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 Special screening for malignant neoplasms, colon   . Benign neoplasm of transverse colon   . Fracture of right inferior pubic ramus ([place]) 09/17/2014  . Compression fracture of L3 lumbar vertebra ([facility]) 09/17/2014    Orientation RESPIRATION BLADDER Height & Weight     Self, Time, Situation, Place  Normal Continent Weight: 149 lb (67.6 kg) Height:  5\' 6"  (167.6 cm)  BEHAVIORAL SYMPTOMS/MOOD NEUROLOGICAL BOWEL NUTRITION STATUS   (none)  (none) Continent Diet (Diet: Clear Liquid )  AMBULATORY STATUS COMMUNICATION OF NEEDS Skin   Extensive Assist Verbally Surgical wounds (Incision: Right Knee )                       Personal Care Assistance Level of Assistance  Bathing, Feeding, Dressing Bathing Assistance: Limited assistance Feeding assistance: Independent Dressing Assistance: Limited assistance     Functional Limitations Info  Sight, Hearing, Speech Sight Info: Adequate Hearing Info: Adequate Speech Info: Adequate    SPECIAL CARE FACTORS FREQUENCY  PT (By licensed PT), OT (By licensed OT)     PT Frequency:  (5) OT Frequency:  (5)            Contractures      Additional Factors Info  Code  Status, Allergies Code Status Info:  (Full Code. ) Allergies Info:  (Other, Penicillins)           Current Medications (12/13/2015):  This is the current [place] active medication list Current Facility-Administered Medications  Medication Dose Route Frequency Provider Last Rate Last Dose  . 0.9 %  sodium chloride infusion   Intravenous Continuous Sonya Leep, MD      . acetaminophen (OFIRMEV) IV 1,000 mg  1,000 mg Intravenous Q6H Sonya Leep, MD      . Derrill Memo ON 12/14/2015] acetaminophen (TYLENOL) tablet 650 mg  650 mg Oral Q6H PRN Sonya Leep, MD       Or  . Derrill Memo ON 12/14/2015] acetaminophen (TYLENOL) suppository 650 mg  650 mg Rectal Q6H PRN Sonya Leep, MD      . acidophilus (RISAQUAD) capsule 2 capsule  2 capsule Oral Daily Sonya Leep, MD      . alum & mag hydroxide-simeth (MAALOX/MYLANTA) 200-200-20 MG/5ML suspension 30 mL  30 mL Oral Q4H PRN Sonya Leep, MD      . Derrill Memo ON 12/14/2015] B-complex with vitamin C tablet 1 tablet  1 tablet Oral Weekly Sonya Leep, MD      . bisacodyl (DULCOLAX) suppository 10 mg  10 mg Rectal Daily PRN Sonya Leep, MD      . Derrill Memo ON 12/14/2015] calcium carbonate (OS-CAL - dosed in mg of elemental calcium)  tablet 500 mg of elemental calcium  1 tablet Oral Daily Sonya Leep, MD      . cholecalciferol (VITAMIN D) tablet 2,000 Units  2,000 Units Oral Daily Sonya Leep, MD      . clindamycin (CLEOCIN) 900 MG/50ML IVPB           . clindamycin (CLEOCIN) IVPB 600 mg  600 mg Intravenous Q6H Sonya Leep, MD      . diphenhydrAMINE (BENADRYL) 12.5 MG/5ML elixir 12.5-25 mg  12.5-25 mg Oral Q4H PRN Sonya Leep, MD      . Derrill Memo ON 12/14/2015] enoxaparin (LOVENOX) injection 30 mg  30 mg Subcutaneous Q12H Sonya Leep, MD      . estrogens (conjugated) (PREMARIN) tablet 0.625 mg  0.625 mg Oral Daily Sonya Leep, MD      . ferrous sulfate tablet 325 mg  325 mg Oral BID WC Sonya Leep, MD      . magnesium hydroxide (MILK OF MAGNESIA)  suspension 30 mL  30 mL Oral Daily PRN Sonya Leep, MD      . menthol-cetylpyridinium (CEPACOL) lozenge 3 mg  1 lozenge Oral PRN Sonya Leep, MD       Or  . phenol (CHLORASEPTIC) mouth spray 1 spray  1 spray Mouth/Throat PRN Sonya Leep, MD      . metoCLOPramide (REGLAN) tablet 10 mg  10 mg Oral TID AC & HS Sonya Leep, MD      . morphine 2 MG/ML injection 2 mg  2 mg Intravenous Q2H PRN Sonya Leep, MD      . multivitamin with minerals tablet 1 tablet  1 tablet Oral Daily Sonya Leep, MD      . ondansetron (ZOFRAN) tablet 4 mg  4 mg Oral Q6H PRN Sonya Leep, MD       Or  . ondansetron (ZOFRAN) injection 4 mg  4 mg Intravenous Q6H PRN Sonya Leep, MD      . oxyCODONE (Oxy IR/ROXICODONE) immediate release tablet 5-10 mg  5-10 mg Oral Q4H PRN Sonya Leep, MD      . pantoprazole (PROTONIX) EC tablet 40 mg  40 mg Oral BID Sonya Leep, MD      . senna-docusate (Senokot-S) tablet 1 tablet  1 tablet Oral BID Sonya Leep, MD      . sodium chloride flush 0.9 % injection           . sodium phosphate (FLEET) 7-19 GM/118ML enema 1 enema  1 enema Rectal Once PRN Sonya Leep, MD      . traMADol Veatrice Bourbon) tablet 50-100 mg  50-100 mg Oral Q4H PRN Sonya Leep, MD      . zolpidem (AMBIEN) tablet 5 mg  5 mg Oral QHS PRN Sonya Leep, MD         Discharge Medications: Please see discharge summary for a list of discharge medications.  Relevant Imaging Results:  Relevant Lab Results:   Additional Information  (SSN: [id number])  [place], Sonya Beets, LCSW

## 2015-12-13 NOTE — Anesthesia Preprocedure Evaluation (Signed)
Anesthesia Evaluation  Patient identified by MRN, date of birth, ID band Patient awake    Reviewed: Allergy & Precautions, H&P , NPO status , Patient's Chart, lab work & pertinent test results  History of Anesthesia Complications Negative for: history of anesthetic complications  Airway Mallampati: III  TM Distance: <3 FB Neck ROM: limited    Dental  (+) Poor Dentition   Pulmonary neg shortness of breath, sleep apnea and Continuous Positive Airway Pressure Ventilation , COPD, former smoker,    Pulmonary exam normal breath sounds clear to auscultation       Cardiovascular Exercise Tolerance: Good (-) angina(-) Past MI and (-) DOE negative cardio ROS Normal cardiovascular exam Rhythm:regular Rate:Normal     Neuro/Psych negative neurological ROS  negative psych ROS   GI/Hepatic negative GI ROS, Neg liver ROS,   Endo/Other  negative endocrine ROS  Renal/GU negative Renal ROS  negative genitourinary   Musculoskeletal  (+) Arthritis ,   Abdominal   Peds  Hematology negative hematology ROS (+)   Anesthesia Other Findings Past Medical History: No date: Arthritis No date: Back pain No date: Insomnia No date: Medical history non-contributory No date: Osteoporosis  Past Surgical History: No date: ABDOMINAL HYSTERECTOMY No date: BREAST LUMPECTOMY Right 07/18/2015: COLONOSCOPY WITH PROPOFOL N/A     Comment: Procedure: COLONOSCOPY WITH PROPOFOL;                Surgeon: [name], MD;  Location: [facility]               ENDOSCOPY;  Service: Endoscopy;  Laterality:               N/A; No date: COSMETIC SURGERY No date: GASTRIC BYPASS     Comment: X2 No date: HEMORROIDECTOMY No date: KNEE ARTHROSCOPY No date: REPLACEMENT TOTAL KNEE Left  BMI    Body Mass Index:  24.05 kg/m      Reproductive/Obstetrics negative OB ROS                             Anesthesia Physical Anesthesia Plan  ASA:  III  Anesthesia Plan: Spinal   Post-op Pain Management:    Induction:   Airway Management Planned:   Additional Equipment:   Intra-op Plan:   Post-operative Plan:   Informed Consent: I have reviewed the patients History and Physical, chart, labs and discussed the procedure including the risks, benefits and alternatives for the proposed anesthesia with the patient or authorized representative who has indicated his/her understanding and acceptance.   Dental Advisory Given  Plan Discussed with: Anesthesiologist, CRNA and Surgeon  Anesthesia Plan Comments:         Anesthesia Quick Evaluation

## 2015-12-13 NOTE — Op Note (Signed)
OPERATIVE NOTE  DATE OF SURGERY:  12/13/2015  PATIENT NAME:  Sonya Park   DOB: 01-May-1940  MRN: [id number]  PRE-OPERATIVE DIAGNOSIS: Degenerative arthrosis of the right knee, primary  POST-OPERATIVE DIAGNOSIS:  Same  PROCEDURE:  Right total knee arthroplasty using computer-assisted navigation  SURGEON:  Marciano Sequin. M.D.  ASSISTANT:  Vance Peper, PA (present and scrubbed throughout the case, critical for assistance with exposure, retraction, instrumentation, and closure)  ANESTHESIA: spinal  ESTIMATED BLOOD LOSS: 25 mL  FLUIDS REPLACED: 900 mL of crystalloid  TOURNIQUET TIME: 92 minutes  DRAINS: 2 medium drains to a reinfusion system  SOFT TISSUE RELEASES: Anterior cruciate ligament, posterior cruciate ligament, deep medial collateral ligament, patellofemoral ligament  IMPLANTS UTILIZED: DePuy Attune size 5 posterior stabilized femoral component (cemented), size 4 rotating platform tibial component (cemented), 35 mm medialized dome patella (cemented), and a 5 mm stabilized rotating platform polyethylene insert.  INDICATIONS FOR SURGERY: Sonya Park is a 75 y.o. year old female with a long history of progressive knee pain. X-rays demonstrated severe degenerative changes in tricompartmental fashion. The patient had not seen any significant improvement despite conservative nonsurgical intervention. After discussion of the risks and benefits of surgical intervention, the patient expressed understanding of the risks benefits and agree with plans for total knee arthroplasty.   The risks, benefits, and alternatives were discussed at length including but not limited to the risks of infection, bleeding, nerve injury, stiffness, blood clots, the need for revision surgery, cardiopulmonary complications, among others, and they were willing to proceed.  PROCEDURE IN DETAIL: The patient was brought into the operating room and, after adequate spinal anesthesia was achieved, a tourniquet  was placed on the patient's upper thigh. The patient's knee and leg were cleaned and prepped with alcohol and DuraPrep and draped in the usual sterile fashion. A "timeout" was performed as per usual protocol. The lower extremity was exsanguinated using an Esmarch, and the tourniquet was inflated to 300 mmHg. An anterior longitudinal incision was made followed by a standard mid vastus approach. The deep fibers of the medial collateral ligament were elevated in a subperiosteal fashion off of the medial flare of the tibia so as to maintain a continuous soft tissue sleeve. The patella was subluxed laterally and the patellofemoral ligament was incised. Inspection of the knee demonstrated severe degenerative changes with full-thickness loss of articular cartilage. Osteophytes were debrided using a rongeur. Anterior and posterior cruciate ligaments were excised. Two 4.0 mm Schanz pins were inserted in the femur and into the tibia for attachment of the array of trackers used for computer-assisted navigation. Hip center was identified using a circumduction technique. Distal landmarks were mapped using the computer. The distal femur and proximal tibia were mapped using the computer. The distal femoral cutting guide was positioned using computer-assisted navigation so as to achieve a 5 distal valgus cut. The femur was sized and it was felt that a size 5 femoral component was appropriate. A size 5 femoral cutting guide was positioned and the anterior cut was performed and verified using the computer. This was followed by completion of the posterior and chamfer cuts. Femoral cutting guide for the central box was then positioned in the center box cut was performed.  Attention was then directed to the proximal tibia. Medial and lateral menisci were excised. The extramedullary tibial cutting guide was positioned using computer-assisted navigation so as to achieve a 0 varus-valgus alignment and 3 posterior slope. The cut was  performed and verified using the computer.  The proximal tibia was sized and it was felt that a size 4 tibial tray was appropriate. Tibial and femoral trials were inserted followed by insertion of a 5 mm polyethylene insert. This allowed for excellent mediolateral soft tissue balancing both in flexion and in full extension. Finally, the patella was cut and prepared so as to accommodate a 35 mm medialized dome patella. A patella trial was placed and the knee was placed through a range of motion with excellent patellar tracking appreciated. The femoral trial was removed after debridement of posterior osteophytes. The central post-hole for the tibial component was reamed followed by insertion of a keel punch. Tibial trials were then removed. Cut surfaces of bone were irrigated with copious amounts of normal saline with antibiotic solution using pulsatile lavage and then suctioned dry. Polymethylmethacrylate cement was prepared in the usual fashion using a vacuum mixer. Cement was applied to the cut surface of the proximal tibia as well as along the undersurface of a size 4 rotating platform tibial component. Tibial component was positioned and impacted into place. Excess cement was removed using [employer] fast streamer. Cement was then applied to the cut surfaces of the femur as well as along the posterior flanges of the size 5 femoral component. The femoral component was positioned and impacted into place. Excess cement was removed using [employer] fast streamer. A 5 mm polyethylene trial was inserted and the knee was brought into full extension with steady axial compression applied. Finally, cement was applied to the backside of a 35 mm medialized dome patella and the patellar component was positioned and patellar clamp applied. Excess cement was removed using [employer] fast streamer. After adequate curing of the cement, the tourniquet was deflated after a total tourniquet time of 92 minutes. Hemostasis was achieved using electrocautery. The  knee was irrigated with copious amounts of normal saline with antibiotic solution using pulsatile lavage and then suctioned dry. 20 mL of 1.3% Exparel and 60 mL of 0.25% Marcaine in 40 mL of normal saline was injected along the posterior capsule, medial and lateral gutters, and along the arthrotomy site. A 5 mm stabilized rotating platform polyethylene insert was inserted and the knee was placed through a range of motion with excellent mediolateral soft tissue balancing appreciated and excellent patellar tracking noted. 2 medium drains were placed in the wound bed and brought out through separate stab incisions to be attached to a reinfusion system. The medial parapatellar portion of the incision was reapproximated using interrupted sutures of #1 Vicryl. Subcutaneous tissue was approximated in layers using first #0 Vicryl followed #2-0 Vicryl. The skin was approximated with skin staples. A sterile dressing was applied.  The patient tolerated the procedure well and was transported to the recovery room in stable condition.    James P. Holley Bouche., M.D.

## 2015-12-14 ENCOUNTER — Encounter: Payer: Self-pay | Admitting: Orthopedic Surgery

## 2015-12-14 LAB — BASIC METABOLIC PANEL
ANION GAP: 2 — AB (ref 5–15)
BUN: 11 mg/dL (ref 6–20)
CALCIUM: 6.6 mg/dL — AB (ref 8.9–10.3)
CHLORIDE: 105 mmol/L (ref 101–111)
CO2: 28 mmol/L (ref 22–32)
CREATININE: 0.62 mg/dL (ref 0.44–1.00)
GFR calc Af Amer: >60 mL/min (ref >60)
GFR calc non Af Amer: >60 mL/min (ref >60)
Glucose, Bld: 118 mg/dL — ABNORMAL HIGH (ref 65–99)
Potassium: 3.2 mmol/L — ABNORMAL LOW (ref 3.5–5.1)
SODIUM: 135 mmol/L (ref 135–145)

## 2015-12-14 LAB — CBC
HEMATOCRIT: 33.3 % — AB (ref 35.0–47.0)
HEMOGLOBIN: 11.8 g/dL — AB (ref 12.0–16.0)
MCH: 36 pg — ABNORMAL HIGH (ref 26.0–34.0)
MCHC: 35.4 g/dL (ref 32.0–36.0)
MCV: 101.6 fL — ABNORMAL HIGH (ref 80.0–100.0)
Platelets: 114 10*3/uL — ABNORMAL LOW (ref 150–440)
RBC: 3.27 MIL/uL — ABNORMAL LOW (ref 3.80–5.20)
RDW: 12.8 % (ref 11.5–14.5)
WBC: 5.6 10*3/uL (ref 3.6–11.0)

## 2015-12-14 MED ORDER — CALCIUM CARBONATE ANTACID 500 MG PO CHEW
500.0000 mg | CHEWABLE_TABLET | Freq: Every day | ORAL | Status: DC
  Administered 2015-12-14 – 2015-12-15 (×2): 500 mg via ORAL
  Filled 2015-12-14: qty 2
  Filled 2015-12-14 (×2): qty 1

## 2015-12-14 MED ORDER — POTASSIUM CHLORIDE CRYS ER 20 MEQ PO TBCR
20.0000 meq | EXTENDED_RELEASE_TABLET | Freq: Three times a day (TID) | ORAL | Status: AC
  Administered 2015-12-14 (×3): 20 meq via ORAL
  Filled 2015-12-14 (×3): qty 1

## 2015-12-14 NOTE — Evaluation (Signed)
Physical Therapy Evaluation Patient Details Name: Sonya Park MRN: [id number] DOB: [birth year] Today's Date: 12/14/2015   History of Present Illness  Pt is 75 y.o. female s/p R TKA secondary to OA 12/13/15.  PMH includes sleep apnea, CPAP, COPD, R breast lumpectomy, gastric bypass x2, L TKR (12 years ago), pubic ramus fx, L3 compression fx.  Clinical Impression  Prior to admission, pt was independent with functional mobility.  Pt lives with her husband on main floor of home with 1 step to enter (no railing).  Pt sitting on commode upon PT arrival for eval.  Currently pt is min assist with transfers and CGA with ambulation 10 feet with RW.  R knee pain 2/10 at rest and increased to 5/10 with activity.  Pt would benefit from skilled PT to address noted impairments and functional limitations.  Recommend pt discharge to home with 24/7 assist (pt's husband reports he can provide this) when medically appropriate.    Follow Up Recommendations Home health PT;Supervision/Assistance - 24 hour    Equipment Recommendations   (Pt already owns RW for home use)    Recommendations for Other Services       Precautions / Restrictions Precautions Precautions: Fall Precaution Comments: KI if not able to SLR independently Restrictions Weight Bearing Restrictions: Yes RLE Weight Bearing: Weight bearing as tolerated      Mobility  Bed Mobility               General bed mobility comments: Deferred d/t pt already up sitting on commode upon entering room and sitting in chair end of session  Transfers Overall transfer level: Needs assistance Equipment used: Rolling walker (2 wheeled) Transfers: Sit to/from Stand Sit to Stand: Min assist         General transfer comment: 1x from commode; 2x's from bedside chair; vc's required for hand and feet placement  Ambulation/Gait Ambulation/Gait assistance: Min guard Ambulation Distance (Feet): 10 Feet Assistive device: Rolling walker (2 wheeled) Gait  Pattern/deviations: Step-to pattern Gait velocity: decreased   General Gait Details: antalgic; decreased stance time R LE; vc's required for stepping pattern/gait technique with walker use.  No knee buckling noted with ambulation.  Stairs            Wheelchair Mobility    Modified Rankin (Stroke Patients Only)       Balance Overall balance assessment: Needs assistance Sitting-balance support: No upper extremity supported;Feet supported Sitting balance-Leahy Scale: Good     Standing balance support: Bilateral upper extremity supported (on RW) Standing balance-Leahy Scale: Fair                               Pertinent Vitals/Pain Pain Assessment: 0-10 Pain Score: 5  (2/10 at rest; 5/10 with activity) Pain Location: R knee Pain Descriptors / Indicators: Sore;Tender Pain Intervention(s): Limited activity within patient's tolerance;Monitored during session;Premedicated before session;Repositioned;Patient requesting pain meds-RN notified;RN gave pain meds during session;Ice applied  Vitals stable and WFL throughout treatment session (HR and O2).    Home Living Family/patient expects to be discharged to:: Private residence Living Arrangements: Spouse/significant other Available Help at Discharge: Family Type of Home: House Home Access: Stairs to enter Entrance Stairs-Rails: None Entrance Stairs-Number of Steps: 1 Home Layout: One level Home Equipment: Environmental consultant - 2 wheels;Cane - single point;Toilet riser;Shower seat      Prior Function Level of Independence: Independent         Comments: Pt reports 3 falls in  past 6 months.     Hand Dominance        Extremity/Trunk Assessment   Upper Extremity Assessment: Overall WFL for tasks assessed           Lower Extremity Assessment: RLE deficits/detail;LLE deficits/detail RLE Deficits / Details: R hip flexion at least 3/5; R knee flexion/extension at least 2+/5; R DF at least 3+/5 LLE Deficits /  Details: L LE strength and ROM WFL  Cervical / Trunk Assessment: Normal  Communication   Communication: No difficulties  Cognition Arousal/Alertness: Awake/alert Behavior During Therapy: WFL for tasks assessed/performed Overall Cognitive Status: Within Functional Limits for tasks assessed                      General Comments General comments (skin integrity, edema, etc.): Pt sitting on commode with nursing present upon PT entering room.  R hemovac, polar care, and dressings R knee intact. Pt agreeable to PT session.    Exercises Total Joint Exercises Ankle Circles/Pumps: AROM;Strengthening;Both;10 reps;Supine Quad Sets: AROM;Strengthening;Both;10 reps;Supine Short Arc Quad: AAROM;Strengthening;Right;10 reps;Supine Heel Slides: AAROM;Strengthening;Right;10 reps;Supine Hip ABduction/ADduction: AAROM;Strengthening;Right;10 reps;Supine Straight Leg Raises: AAROM;Strengthening;Right;10 reps;Supine (minimal assist to achieve SLR) Goniometric ROM: Semi-supine position in chair R knee extension 12 degrees short of neutral (gentle overpressure with active quad set); sitting in chair R knee flexion 65 degrees (AROM)  Vc's required for technique for ex's.      Assessment/Plan    PT Assessment Patient needs continued PT services  PT Diagnosis Difficulty walking;Acute pain   PT Problem List Decreased strength;Decreased range of motion;Decreased activity tolerance;Decreased balance;Decreased mobility;Decreased knowledge of use of DME;Decreased knowledge of precautions;Pain  PT Treatment Interventions DME instruction;Gait training;Stair training;Functional mobility training;Therapeutic activities;Therapeutic exercise;Balance training;Patient/family education   PT Goals (Current goals can be found in the Care Plan section) Acute Rehab PT Goals Patient Stated Goal: to have less pain PT Goal Formulation: With patient Time For Goal Achievement: 12/28/15 Potential to Achieve Goals: Good     Frequency BID   Barriers to discharge        Co-evaluation               End of Session Equipment Utilized During Treatment: Gait belt Activity Tolerance: Patient tolerated treatment well Patient left: in chair;with call bell/phone within reach;with chair alarm set;with family/visitor present (B heels elevated via towel rolls; OT present and reported she would donn polar care and SCD's when she was done) Nurse Communication: Mobility status;Patient requests pain meds;Precautions;Weight bearing status         Time: [id number] PT Time Calculation (min) (ACUTE ONLY): 44 min   Charges:   PT Evaluation $PT Eval Low Complexity: 1 Procedure PT Treatments $Therapeutic Exercise: 8-22 mins $Therapeutic Activity: 8-22 mins   PT G CodesLeitha Bleak 2015-12-25, 9:50 AM Leitha Bleak, [place]

## 2015-12-14 NOTE — Progress Notes (Signed)
Foley d/c'd at 0555 

## 2015-12-14 NOTE — Care Management Note (Addendum)
Case Management Note  Patient Details  Name: Sonya Park MRN: [id number] Date of Birth: June 05, 1940  Subjective/Objective:     Spoke with patient and husband at the bedside for discharge planning. Patient is alert and oriented and has just finished working with PT. Recommendation is for home with home health.  Patient already has a walker and bedside commode. Patient pharmacy is [facility] and patient chose [facility].  Referral placed with Corliss Blacker. Lovenox 40mg  injection once daily for 14 days no refills called to [facility] [phone]. Aco pay $75 patient informed.        Action/Plan: Anticipated discharge is home with Home Health.    Expected Discharge Date:                  Expected Discharge Plan:  [place]  In-House Referral:     Discharge planning Services  CM Consult  Post Acute Care Choice:  NA Choice offered to:  Patient, Spouse  DME Arranged:  N/A DME Agency:  NA  HH Arranged:  PT [place] Agency:  [place] (now [facility])  Status of Service:  In process, will continue to follow  If discussed at Long Length of Stay Meetings, dates discussed:    Additional Comments:  Alvie Heidelberg, RN 12/14/2015, 10:19 AM

## 2015-12-14 NOTE — Evaluation (Signed)
Occupational Therapy Evaluation Patient Details Name: Sonya Park MRN: [id number] DOB: 09-16-1940 Today's Date: 12/14/2015    History of Present Illness Pt. is a 75 y.o. female who was admitted for a right TKR. Pt. PMHx includes: COPD, sleep apnea, CPAP, right breast lumpectomy, gastric bypass x2, left TKR. Pubic Ramus Fx.   Clinical Impression   Pt. Is a 75 y.o. female who was admitted for a right TKR. Pt presents with limited ROM, Pain, weakness, and impaired functional mobility which hinder her ability to complete ADL and IADL tasks. Pt. could benefit from skilled OT services to review A/E use for LE ADLs, to review necessary home modifications, and to improve functional mobility for ADL/IADLs in order to work towards regaining Independence with ADL/IADLs.     Follow Up Recommendations  Home health OT    Equipment Recommendations       Recommendations for Other Services PT consult     Precautions / Restrictions Precautions Precautions: Fall Precaution Comments: KI if not able to SLR independently Restrictions Weight Bearing Restrictions: Yes RLE Weight Bearing: Weight bearing as tolerated      Mobility Bed Mobility               Transfers Overall transfer level: Needs assistance Equipment used: Rolling walker (2 wheeled) Transfers: Sit to/from Stand Sit to Stand: Min assist         General transfer comment: 1x from commode; 2x's from bedside chair; vc's required for hand and feet placement    Balance Overall balance assessment: Needs assistance   Sitting balance-Leahy Scale: Good     Standing balance support:  (on RW) Standing balance-Leahy Scale: Fair                              ADL Overall ADL's : Needs assistance/impaired Eating/Feeding: Set up;Minimal assistance   Grooming: Set up;Minimal assistance               Lower Body Dressing: Moderate assistance               Functional mobility during ADLs: Minimal  assistance General ADL Comments: Pt. education was provided about A/E use for LE dressing.     Vision     Perception     Praxis      Pertinent Vitals/Pain Pain Assessment: 0-10 Pain Score: 3  Pain Location: Right knee Pain Descriptors / Indicators: Tender;Sore Pain Intervention(s): Limited activity within patient's tolerance     Hand Dominance Right   Extremity/Trunk Assessment Upper Extremity Assessment Upper Extremity Assessment: Overall WFL for tasks assessed   Cervical / Trunk Assessment Cervical / Trunk Assessment: Normal   Communication Communication Communication: No difficulties   Cognition Arousal/Alertness: Awake/alert Behavior During Therapy: WFL for tasks assessed/performed Overall Cognitive Status: Within Functional Limits for tasks assessed                     General Comments       Exercises       Shoulder Instructions      Home Living Family/patient expects to be discharged to:: Private residence Living Arrangements: Spouse/significant other Available Help at Discharge: Family Type of Home: House Home Access: Stairs to enter Technical brewer of Steps: 1 Entrance Stairs-Rails: None Home Layout: One level     Bathroom Shower/Tub: Walk-in shower;Door   [employer] Toilet: Standard     Home Equipment: Environmental consultant - 2 wheels;Cane - single point;Toilet riser;Shower seat  Prior Functioning/Environment Level of Independence: Independent        Comments: Pt reports 3 falls in past 6 months.    OT Diagnosis: Generalized weakness;Acute pain   OT Problem List: Decreased strength;Decreased activity tolerance;Impaired balance (sitting and/or standing);Pain   OT Treatment/Interventions: Self-care/ADL training;Therapeutic exercise;Therapeutic activities;DME and/or AE instruction;Patient/family education;Energy conservation    OT Goals(Current goals can be found in the care plan section) Acute Rehab OT Goals Patient Stated  Goal: To return home OT Goal Formulation: With patient Potential to Achieve Goals: Good  OT Frequency: Min 1X/week   Barriers to D/C:            Co-evaluation              End of Session Equipment Utilized During Treatment: Gait belt  Activity Tolerance: Patient tolerated treatment well Patient left: in chair;with chair alarm set;with call bell/phone within reach;with family/visitor present   Time: 0920-0943 OT Time Calculation (min): 23 min Charges:  OT General Charges $OT Visit: 1 Procedure OT Evaluation $OT Eval Moderate Complexity: 1 Procedure OT Treatments $Self Care/Home Management : 8-22 mins G-Codes:    Harrel Carina, MS, OTR/L 12/14/2015, 10:30 AM

## 2015-12-14 NOTE — Anesthesia Postprocedure Evaluation (Deleted)
Anesthesia Post Note  Patient: Sonya Park  Procedure(s) Performed: Procedure(s) (LRB): COMPUTER ASSISTED TOTAL KNEE ARTHROPLASTY (Right)  Patient location during evaluation: Mother Baby Anesthesia Type: Epidural Level of consciousness: awake, awake and alert and oriented Pain management: pain level controlled Vital Signs Assessment: post-procedure vital signs reviewed and stable Respiratory status: spontaneous breathing, nonlabored ventilation and respiratory function stable Cardiovascular status: blood pressure returned to baseline and stable Postop Assessment: no headache, no backache and patient able to bend at knees Anesthetic complications: no    Last Vitals:  Vitals:   12/14/15 0349 12/14/15 0718  BP: 120/69 132/76  Pulse: 79 78  Resp: 19 16  Temp: 36.7 C 36.9 C    Last Pain:  Vitals:   12/14/15 0718  TempSrc: Oral  PainSc: 4     LLE Motor Response: Purposeful movement (12/14/15 0710)   RLE Motor Response: No movement due to regional block;Purposeful movement (12/14/15 0710) RLE Sensation: Full sensation (12/14/15 0710)      Johnna Acosta

## 2015-12-14 NOTE — Addendum Note (Signed)
Addendum  created 12/14/15 [id number] by Demetrius Charity, CRNA   Sign clinical note

## 2015-12-14 NOTE — Addendum Note (Signed)
Addendum  created 12/14/15 0731 by Johnna Acosta, CRNA   Delete clinical note

## 2015-12-14 NOTE — Anesthesia Postprocedure Evaluation (Signed)
Anesthesia Post Note  Patient: Sonya Park  Procedure(s) Performed: Procedure(s) (LRB): COMPUTER ASSISTED TOTAL KNEE ARTHROPLASTY (Right)  Patient location during evaluation: Nursing Unit Anesthesia Type: Spinal Level of consciousness: oriented and awake and alert Pain management: satisfactory to patient Vital Signs Assessment: post-procedure vital signs reviewed and stable Respiratory status: respiratory function stable Cardiovascular status: stable Postop Assessment: no headache, no backache, patient able to bend at knees, no signs of nausea or vomiting, adequate PO intake and spinal receding Anesthetic complications: no    Last Vitals:  Vitals:   12/14/15 0349 12/14/15 0718  BP: 120/69 132/76  Pulse: 79 78  Resp: 19 16  Temp: 36.7 C 36.9 C    Last Pain:  Vitals:   12/14/15 0718  TempSrc: Oral  PainSc: 4                  Blima Singer

## 2015-12-14 NOTE — Progress Notes (Signed)
Physical Therapy Treatment Patient Details Name: Sonya Park MRN: [id number] DOB: [birth year] Today's Date: 12/14/2015    History of Present Illness Pt is 75 y.o. female s/p R TKA secondary to OA 12/13/15.  PMH includes sleep apnea, CPAP, COPD, R breast lumpectomy, gastric bypass x2, L TKR (12 years ago), pubic ramus fx, L3 compression fx.    PT Comments    Pt is progressing towards goals. Pt able to transfer sit to stand and ambulate +1 min guard. Pt ambulated 60 feet using RW and required verbal cues to decreased step and stride length in order to improve safety with ambulation. Pt pain at rest 2/10 and during ambulation 6/10. Pt is able to complete exercises with minimal verbal cuing and demonstrated independent SLRs, thus KI not placed at this time. Pt will continue to benefit from PT services to continue improvement with impairments and to reach goals. Pt will benefit from continued PT to complete stair training, decrease assistance with functional mobility and improved ambulation distance prior to discharge.    Follow Up Recommendations  Home health PT;Supervision/Assistance - 24 hour     Equipment Recommendations       Recommendations for Other Services       Precautions / Restrictions Precautions Precautions: Knee;Fall Precaution Comments: KI if not able to SLR independently Restrictions Weight Bearing Restrictions: Yes RLE Weight Bearing: Weight bearing as tolerated    Mobility  Bed Mobility Overal bed mobility: Needs Assistance Bed Mobility: Sit to Supine       Sit to supine: Min assist   General bed mobility comments: Min assist for getting R LE unto the bed only  Transfers Overall transfer level: Needs assistance Equipment used: Rolling walker (2 wheeled) Transfers: Sit to/from Stand Sit to Stand: Min assist         General transfer comment: sit to stand from recliner, stand to sit onto EOB, pt supine in bed at end of  session  Ambulation/Gait Ambulation/Gait assistance: Min guard Ambulation Distance (Feet): 60 Feet Assistive device: Rolling walker (2 wheeled)   Gait velocity: decreased   General Gait Details: mild R knee buckling 1 time with increased step/stride length, verbal cuing to correct; antalgic   Stairs            Wheelchair Mobility    Modified Rankin (Stroke Patients Only)       Balance Overall balance assessment: Needs assistance Sitting-balance support: Feet supported Sitting balance-Leahy Scale: Good     Standing balance support: Bilateral upper extremity supported (on RW) Standing balance-Leahy Scale: Fair                      Cognition Arousal/Alertness: Awake/alert Behavior During Therapy: WFL for tasks assessed/performed Overall Cognitive Status: Within Functional Limits for tasks assessed                      Exercises Total Joint Exercises Ankle Circles/Pumps: AROM;Strengthening;Both;10 reps (seated in recliner with B LE elevated) Straight Leg Raises: AROM;Strengthening;Right;10 reps;Semi supine (reclined in chair with B LE elevated) General Exercises - Lower Extremity Long Arc Quad: AROM;Strengthening;Right;10 reps;Seated Hip Flexion/Marching: AROM;Strengthening;Both;10 reps;Seated (limited AROM R hip flex; vc's to not rock/proper technique)    General Comments        Pertinent Vitals/Pain Pain Assessment: 0-10 Pain Score: 6  (2/10 at rest, 6/10 with ambulation (8/10 with therex)) Pain Location: R knee Pain Descriptors / Indicators: Sore Pain Intervention(s): Limited activity within patient's tolerance;Monitored during session;Premedicated before session;Ice  applied;Repositioned    Home Living                      Prior Function            PT Goals (current goals can now be found in the care plan section) Acute Rehab PT Goals Patient Stated Goal: To return home.  PT Goal Formulation: With patient Time For Goal  Achievement: 12/28/15 Potential to Achieve Goals: Good Progress towards PT goals: Progressing toward goals    Frequency  BID    PT Plan Current plan remains appropriate    Co-evaluation             End of Session Equipment Utilized During Treatment: Gait belt Activity Tolerance: Patient tolerated treatment well Patient left: in bed;with call bell/phone within reach;with bed alarm set;with SCD's reapplied (foot pumps (working); polar care in place and activated; bone foam in place R LE ; towel roll to elevate L LE in place)     Time: [id number] PT Time Calculation (min) (ACUTE ONLY): 31 min  Charges:                       G CodesRiley Nearing, SPT 12/14/2015, 3:21 PM

## 2015-12-14 NOTE — Progress Notes (Signed)
Clinical Social Worker (CSW) received SNF consult. PT is recommending home health. RN Case Manager is aware of above. Please reconsult if future social work needs arise. CSW signing off.   Ashon Rosenberg, LCSW [phone] 

## 2015-12-14 NOTE — Progress Notes (Signed)
Subjective: 1 Day Post-Op Procedure(s) (LRB): COMPUTER ASSISTED TOTAL KNEE ARTHROPLASTY (Right) Patient reports pain as 8 on 0-10 scale.   Patient is well, and has had no acute complaints or problems Plan is to go Home after hospital stay. Negative for chest pain and shortness of breath Fever: no Gastrointestinal:Negative for nausea and vomiting  Objective: Vital signs in last 24 hours: Temp:  [97.4 F (36.3 C)-98.5 F (36.9 C)] 98.5 F (36.9 C) (09/07 0718) Pulse Rate:  [58-86] 78 (09/07 0718) Resp:  [14-20] 16 (09/07 0718) BP: (98-133)/(63-78) 132/76 (09/07 0718) SpO2:  [94 %-100 %] 94 % (09/07 0718)  Intake/Output from previous day:  Intake/Output Summary (Last 24 hours) at 12/14/15 0739 Last data filed at 12/14/15 0555  Gross per 24 hour  Intake          3938.33 ml  Output             2110 ml  Net          1828.33 ml    Intake/Output this shift: No intake/output data recorded.  Labs:  Recent Labs  12/14/15 0353  HGB 11.8*    Recent Labs  12/14/15 0353  WBC 5.6  RBC 3.27*  HCT 33.3*  PLT 114*    Recent Labs  12/14/15 0353  NA 135  K 3.2*  CL 105  CO2 28  BUN 11  CREATININE 0.62  GLUCOSE 118*  CALCIUM 6.6*   No results for input(s): LABPT, INR in the last 72 hours.   EXAM General - Patient is Alert, Appropriate and Oriented Extremity - ABD soft Sensation intact distally Dorsiflexion/Plantar flexion intact Incision: dressing C/D/I Dressing/Incision - clean, dry without drainage Motor Function - intact, moving foot and toes well on exam.   Abdomen soft on exam, normal BS without tympany. Pt able to perform a straight leg raise with moderate pain.  Past Medical History:  Diagnosis Date  . Arthritis   . Back pain   . Insomnia   . Medical history non-contributory   . Osteoporosis     Assessment/Plan: 1 Day Post-Op Procedure(s) (LRB): COMPUTER ASSISTED TOTAL KNEE ARTHROPLASTY (Right) Active Problems:   S/P total knee  arthroplasty  Estimated body mass index is 24.05 kg/m as calculated from the following:   Height as of this encounter: 5\' 6"  (1.676 m).   Weight as of this encounter: 67.6 kg (149 lb). Advance diet Up with therapy D/C IV fluids when tolerating po intake.  Pt having a problem with pain control, will continue alternating oxycodone and tramadol for pain. K+ 3.2 this AM, will supplement. Labs reviewed, CBC and BMP ordered for tomorrow. Pt will need to have a BM prior to discharge. Will remove bulky dressing and hemovac tomorrow morning.  DVT Prophylaxis - Lovenox, Foot Pumps and TED hose Weight-Bearing as tolerated to right leg  J. Cameron Proud, PA-C [facility] Orthopaedic Surgery 12/14/2015, 7:39 AM

## 2015-12-15 LAB — CBC
HEMATOCRIT: 33 % — AB (ref 35.0–47.0)
Hemoglobin: 11.5 g/dL — ABNORMAL LOW (ref 12.0–16.0)
MCH: 35.5 pg — AB (ref 26.0–34.0)
MCHC: 34.9 g/dL (ref 32.0–36.0)
MCV: 101.9 fL — AB (ref 80.0–100.0)
Platelets: 111 10*3/uL — ABNORMAL LOW (ref 150–440)
RBC: 3.24 MIL/uL — ABNORMAL LOW (ref 3.80–5.20)
RDW: 12.5 % (ref 11.5–14.5)
WBC: 6 10*3/uL (ref 3.6–11.0)

## 2015-12-15 LAB — BASIC METABOLIC PANEL
ANION GAP: 5 (ref 5–15)
BUN: 10 mg/dL (ref 6–20)
CALCIUM: 6.9 mg/dL — AB (ref 8.9–10.3)
CO2: 26 mmol/L (ref 22–32)
Chloride: 106 mmol/L (ref 101–111)
Creatinine, Ser: 0.54 mg/dL (ref 0.44–1.00)
GFR calc Af Amer: >60 mL/min (ref >60)
GFR calc non Af Amer: >60 mL/min (ref >60)
GLUCOSE: 85 mg/dL (ref 65–99)
Potassium: 3.6 mmol/L (ref 3.5–5.1)
Sodium: 137 mmol/L (ref 135–145)

## 2015-12-15 MED ORDER — TRAMADOL HCL 50 MG PO TABS: 50.0000 mg | ORAL_TABLET | ORAL | 0 refills | Status: DC | PRN

## 2015-12-15 MED ORDER — OXYCODONE HCL 5 MG PO TABS: 5.0000 mg | ORAL_TABLET | ORAL | 0 refills | Status: DC | PRN

## 2015-12-15 NOTE — Progress Notes (Signed)
Paged and spoke to Cameron Proud regarding patient's bp while working with PT. Patient's bp was 161/94 and HR in 100. He advised nurse to d/c after afternoon pt session.

## 2015-12-15 NOTE — Progress Notes (Signed)
Patient being discharged to home with [facility]. DC & Rx instructions given and pt acknowledged understanding. Belongings packed, polar care emptied & bone foam sent with patient. IV's removed. Honeycomb dressing changed. Nurse tech prepared patient for dc.

## 2015-12-15 NOTE — Discharge Instructions (Signed)
Diet: As you were doing prior to hospitalization   Shower:  May shower but keep the wounds dry, use an occlusive plastic wrap, NO SOAKING IN TUB.  If the bandage gets wet, change with a clean dry gauze.  Dressing:  You may change your dressing as needed. Change the dressing with sterile gauze dressing.    Activity:  Increase activity slowly as tolerated, but follow the weight bearing instructions below.  No lifting or driving for 6 weeks.  Weight Bearing:   Weight bearing as tolerated to right lower extremity  To prevent constipation: you may use a stool softener such as -  Colace (over the counter) 100 mg by mouth twice a day  Drink plenty of fluids (prune juice may be helpful) and high fiber foods Miralax (over the counter) for constipation as needed.    Itching:  If you experience itching with your medications, try taking only a single pain pill, or even half a pain pill at a time.  You may take up to 10 pain pills per day, and you can also use benadryl over the counter for itching or also to help with sleep.   Precautions:  If you experience chest pain or shortness of breath - call 911 immediately for transfer to the hospital emergency department!!  If you develop a fever greater that 101 F, purulent drainage from wound, increased redness or drainage from wound, or calf pain-Call [facility]                                              Follow- Up Appointment:  Please call for an appointment to be seen in 2 weeks at [facility]  Alternate taking Oxycodone and Tramadol for pain when you get home every 4 hours.

## 2015-12-15 NOTE — Discharge Summary (Signed)
Physician Discharge Summary  Patient ID: Sonya Park MRN: [id number] DOB/AGE: 1940-12-22 75 y.o.  Admit date: 12/13/2015 Discharge date: 12/15/2015  Admission Diagnoses:  OSTEOARTHRITIS Degenerative arthrosis of the right knee  Discharge Diagnoses: Patient Active Problem List   Diagnosis Date Noted  . 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 Special screening for malignant neoplasms, colon   . Benign neoplasm of transverse colon   . Fracture of right inferior pubic ramus ([facility]) 09/17/2014  . Compression fracture of L3 lumbar vertebra ([facility]) 09/17/2014  Degenerative arthrosis of the right knee  Past Medical History:  Diagnosis Date  . Arthritis   . Back pain   . Insomnia   . Medical history non-contributory   . Osteoporosis      Transfusion: None   Consultants (if any):   Discharged Condition: Improved  Hospital Course: Sonya Park is an 75 y.o. female who was admitted 12/13/2015 with a diagnosis of degenerative arthrosis of the right knee and went to the operating room on 12/13/2015 and underwent the above named procedures.    Surgeries: Procedure(s): COMPUTER ASSISTED TOTAL KNEE ARTHROPLASTY on 12/13/2015 Patient tolerated the surgery well. Taken to PACU where she was stabilized and then transferred to the orthopedic floor.  Started on Lovenox 30mg  q 12 hrs. Foot pumps applied bilaterally at 80 mm. Heels elevated on bed with rolled towels. No evidence of DVT. Negative Homan. Physical therapy started on day #1 for gait training and transfer. OT started day #1 for ADL and assisted devices.  Patient's IV and Foley were d/c on POD1.  Hemovac removed on POD2.  Implants: DePuy Attune size 5 posterior stabilized femoral component (cemented), size 4 rotating platform tibial component (cemented), 35 mm medialized dome patella (cemented), and a 5 mm stabilized rotating platform polyethylene insert.  She was given perioperative antibiotics:  Anti-infectives    Start      Dose/Rate Route Frequency Ordered Stop   12/13/15 1300  clindamycin (CLEOCIN) IVPB 600 mg     600 mg 100 mL/hr over 30 Minutes Intravenous Every 6 hours 12/13/15 1149 12/14/15 0620   12/13/15 0550  clindamycin (CLEOCIN) 900 MG/50ML IVPB    Comments:  Ronnell Freshwater: cabinet override      12/13/15 0550 12/13/15 1759   12/13/15 0255  clindamycin (CLEOCIN) IVPB 900 mg     900 mg 100 mL/hr over 30 Minutes Intravenous On call to O.R. 12/13/15 [id number] 12/13/15 0752    .  She was given sequential compression devices, early ambulation, and lovenox for DVT prophylaxis.  She benefited maximally from the hospital stay and there were no complications.    Recent vital signs:  Vitals:   12/15/15 0402 12/15/15 0726  BP: (!) 141/82 (!) 151/81  Pulse: 85 84  Resp: 16 16  Temp: 98.3 F (36.8 C) 97.9 F (36.6 C)    Recent laboratory studies:  Lab Results  Component Value Date   HGB 11.5 (L) 12/15/2015   HGB 11.8 (L) 12/14/2015   HGB 14.8 11/30/2015   Lab Results  Component Value Date   WBC 6.0 12/15/2015   PLT 111 (L) 12/15/2015   Lab Results  Component Value Date   INR 0.95 11/30/2015   Lab Results  Component Value Date   NA 137 12/15/2015   K 3.6 12/15/2015   CL 106 12/15/2015   CO2 26 12/15/2015   BUN 10 12/15/2015   CREATININE 0.54 12/15/2015   GLUCOSE 85 12/15/2015    Discharge Medications:     Medication  List    TAKE these medications   aspirin 81 MG tablet Take 81 mg by mouth daily.   b complex vitamins capsule Take 1 capsule by mouth once a week.   BIOTIN PO Take 1 capsule by mouth daily.   CALCIUM CARBONATE PO Take 1 tablet by mouth daily.   cholecalciferol 1000 units tablet Commonly known as:  VITAMIN D Take 2,000 Units by mouth daily.   clindamycin 150 MG capsule Commonly known as:  CLEOCIN Take 600 mg by mouth daily as needed. Dental work   DIGESTIVE ADVANTAGE GUMMIES Chew Chew 2 Doses by mouth daily.   estrogens (conjugated) 0.625 MG  tablet Commonly known as:  PREMARIN Take 0.625 mg by mouth daily. Take daily for 21 days then do not take for 7 days.   ibandronate 150 MG tablet Commonly known as:  BONIVA Take 150 mg by mouth every 30 (thirty) days. Take in the morning with a full glass of water, on an empty stomach, and do not take anything else by mouth or lie down for the next 30 min.   MULTIVITAMIN PO Take 1 tablet by mouth daily.   oxyCODONE 5 MG immediate release tablet Commonly known as:  Oxy IR/ROXICODONE Take 1-2 tablets (5-10 mg total) by mouth every 4 (four) hours as needed for moderate pain.   traMADol 50 MG tablet Commonly known as:  ULTRAM Take 1-2 tablets (50-100 mg total) by mouth every 4 (four) hours as needed for moderate pain. What changed:  how much to take  when to take this  reasons to take this   zolpidem 5 MG tablet Commonly known as:  AMBIEN Take 5 mg by mouth at bedtime as needed for sleep.   ZZZQUIL 50 MG/30ML Liqd Generic drug:  DiphenhydrAMINE HCl Take 30 mLs by mouth at bedtime as needed (for sleep).       Diagnostic Studies: Dg Knee Right Port  Result Date: 12/13/2015 CLINICAL DATA:  Postoperative images following right total knee joint replacement. EXAM: PORTABLE RIGHT KNEE - 1-2 VIEW COMPARISON:  None in PACs FINDINGS: The patient has undergone right total knee joint replacement. Radiographic positioning of the prosthetic components is good. The native bone exhibits no acute abnormality. Surgical skin staples and drainage tubes are present. IMPRESSION: There is no postprocedure complication following right total knee joint replacement. Electronically Signed   By: David  Martinique M.D.   On: 12/13/2015 11:35   Disposition: Plan will be for discharge home today following morning PT session.   Follow-up Information    WOLFE,JON R., PA Follow up on 12/29/2015.   Specialty:  Physician Assistant Why:  at 9:15am Contact information: [place] [address] [phone]        Dereck Leep, MD Follow up on 01/25/2016.   Specialty:  Orthopedic Surgery Why:  at 9:15am Contact information: [place] [address] [phone]          Signed: Judson Roch PA-C 12/15/2015, 8:03 AM

## 2015-12-15 NOTE — Care Management (Signed)
Anticipate discharge home today .  [facility] notified of discharge.  Confirmed that patient has walker and BSC and 75 dollar copay for Lovenox.

## 2015-12-15 NOTE — Progress Notes (Signed)
Subjective: 2 Days Post-Op Procedure(s) (LRB): COMPUTER ASSISTED TOTAL KNEE ARTHROPLASTY (Right) Patient reports pain as mild.   Patient is well, and has had no acute complaints or problems Plan is to go Home after hospital stay. Negative for chest pain and shortness of breath Fever: no Gastrointestinal:Negative for nausea and vomiting  Objective: Vital signs in last 24 hours: Temp:  [97.6 F (36.4 C)-98.3 F (36.8 C)] 97.9 F (36.6 C) (09/08 0726) Pulse Rate:  [73-89] 84 (09/08 0726) Resp:  [16-20] 16 (09/08 0726) BP: (121-154)/(70-82) 151/81 (09/08 0726) SpO2:  [93 %-99 %] 93 % (09/08 0726)  Intake/Output from previous day:  Intake/Output Summary (Last 24 hours) at 12/15/15 0758 Last data filed at 12/15/15 0618  Gross per 24 hour  Intake              720 ml  Output              500 ml  Net              220 ml    Intake/Output this shift: No intake/output data recorded.  Labs:  Recent Labs  12/14/15 0353 12/15/15 0423  HGB 11.8* 11.5*    Recent Labs  12/14/15 0353 12/15/15 0423  WBC 5.6 6.0  RBC 3.27* 3.24*  HCT 33.3* 33.0*  PLT 114* 111*    Recent Labs  12/14/15 0353 12/15/15 0423  NA 135 137  K 3.2* 3.6  CL 105 106  CO2 28 26  BUN 11 10  CREATININE 0.62 0.54  GLUCOSE 118* 85  CALCIUM 6.6* 6.9*   No results for input(s): LABPT, INR in the last 72 hours.   EXAM General - Patient is Alert, Appropriate and Oriented Extremity - ABD soft Sensation intact distally Dorsiflexion/Plantar flexion intact Incision: scant drainage, bloody with no purulence. Dressing/Incision - Mild bloody drainage present on honeycomb dressing. Motor Function - intact, moving foot and toes well on exam.   Abdomen soft on exam, normal BS without tympany. Pt able to perform a straight leg raise with moderate pain. Bulky dressing removed as well as hemovac pulled.  Past Medical History:  Diagnosis Date  . Arthritis   . Back pain   . Insomnia   . Medical history  non-contributory   . Osteoporosis     Assessment/Plan: 2 Days Post-Op Procedure(s) (LRB): COMPUTER ASSISTED TOTAL KNEE ARTHROPLASTY (Right) Active Problems:   S/P total knee arthroplasty  Estimated body mass index is 24.05 kg/m as calculated from the following:   Height as of this encounter: 5\' 6"  (1.676 m).   Weight as of this encounter: 67.6 kg (149 lb). Up with therapy   Pain control better this AM. Hypokalemia resolved. Labs reviewed, stable Pt has had two BM's since surgery. Hemovac removed today. Plan will be for discharge home today following morning session of PT.  DVT Prophylaxis - Lovenox, Foot Pumps and TED hose Weight-Bearing as tolerated to right leg  J. Cameron Proud, PA-C [facility] Orthopaedic Surgery 12/15/2015, 7:58 AM

## 2015-12-15 NOTE — Progress Notes (Signed)
Physical Therapy Treatment Patient Details Name: Sonya Park MRN: [id number] DOB: 09/16/1940 Today's Date: 12/15/2015    History of Present Illness Pt is 75 y.o. female s/p R TKA secondary to OA 12/13/15.  PMH includes sleep apnea, CPAP, COPD, R breast lumpectomy, gastric bypass x2, L TKR (12 years ago), pubic ramus fx, L3 compression fx.    PT Comments    Pt is day 2 post op R TKA. Pt is physically shaky this morning and has minor difficulty with completing full AROM independently with R LE exercises. Pt reports minor dizziness with supine to sit transfer which mostly resolves. Pt R knee ext is 10 degrees from neutral in supine and R knee flex is 91 degrees in seated position. Pt required 3 rest breaks while ambulating with BP elevated at 161/94 and HR during activity rising from 74 bpm to 100 bpm. Pt spouse reports that it is not abnormal for BP to change and be slightly elevated. Pt BP at end of session is 144/87. RN notified and pt still needs to demonstrate the ability to ambulate further distances without the need for rest breaks and the ability to go up/down stairs without a handrail prior to discharge in order to confirm safe return home.   Follow Up Recommendations  Home health PT;Supervision/Assistance - 24 hour     Equipment Recommendations  Other (comment) (Pt already owns RW for home use)    Recommendations for Other Services       Precautions / Restrictions Precautions Precautions: Knee;Fall Precaution Comments: KI if not able to SLR independently Restrictions Weight Bearing Restrictions: Yes RLE Weight Bearing: Weight bearing as tolerated    Mobility  Bed Mobility Overal bed mobility: Modified Independent Bed Mobility: Supine to Sit     Supine to sit: Modified Independent (mild use of handrail with rolling, slight increase in time)        Transfers Overall transfer level: Needs assistance Equipment used: Rolling walker (2 wheeled) Transfers: Sit to/from  Stand Sit to Stand: Min guard         General transfer comment: requires several attempts to complete however able to complete without vc's; x5  Ambulation/Gait   Ambulation Distance (Feet):  (trial1= 35 ft; trial2= 70 ft; trial3= 40 ft (sit rest breaks)) Assistive device: Rolling walker (2 wheeled) Gait Pattern/deviations: Step-to pattern;Antalgic Gait velocity: variable throughout session (cuing to slow in order to keep with safe gait pattern)   General Gait Details: requires mod cuing to maintain step to pattern leading with R LE and maintaining self inside the RW for safety. Pt shaking with activity requiring sitting rest breaks and vitals checked.    Stairs Stairs:  (deferred this a.m. due to fatigue with ambulation)  PT will attempt stair training this afternoon as able.         Wheelchair Mobility    Modified Rankin (Stroke Patients Only)       Balance Overall balance assessment: Modified Independent Sitting-balance support: Feet supported Sitting balance-Leahy Scale: Good     Standing balance support: Bilateral upper extremity supported (on RW) Standing balance-Leahy Scale: Fair                      Cognition Arousal/Alertness: Awake/alert Behavior During Therapy: WFL for tasks assessed/performed Overall Cognitive Status: Within Functional Limits for tasks assessed                      Exercises Total Joint Exercises Ankle Circles/Pumps: AROM;Strengthening;Both;20 reps;Supine  Short Arc Quad: AROM;Strengthening;Right;10 reps (initial 2 reps AAROM) Heel Slides: AROM;Strengthening;Right;10 reps;Supine (through a limited range) Straight Leg Raises: AAROM;Strengthening;Right;10 reps;Supine Goniometric ROM: R knee flexion seated 91 degrees AROM with gentle pressure; R knee extension in supine with gentle overpressure and quad set 10 degrees short from neutral    General Comments General comments (skin integrity, edema, etc.): skin assessed  over R knee with polar care removed. Skin coloration good with mild drainage at incision.      Pertinent Vitals/Pain Pain Assessment: 0-10 Pain Score: 5  (0/10 at rest; 5/10 with ambulation) Pain Location: R knee Pain Descriptors / Indicators: Sore;Tender Pain Intervention(s): Limited activity within patient's tolerance;Monitored during session;Ice applied    Home Living                      Prior Function            PT Goals (current goals can now be found in the care plan section) Acute Rehab PT Goals Patient Stated Goal: To go home today.  PT Goal Formulation: With patient Time For Goal Achievement: 12/28/15 Potential to Achieve Goals: Good Progress towards PT goals: Progressing toward goals    Frequency  BID    PT Plan Current plan remains appropriate    Co-evaluation             End of Session Equipment Utilized During Treatment: Gait belt Activity Tolerance: Patient limited by fatigue;Patient limited by pain Patient left: in chair;with call bell/phone within reach;with chair alarm set;with family/visitor present;with SCD's reapplied (foot pumps and polar care(applied and active); B towel rolls with B heels suspended)     Time: [id number] PT Time Calculation (min) (ACUTE ONLY): 74 min  Charges:                       G Codes:      Riley Nearing, SPT 12/15/2015, 11:44 AM  Addendum  At beginning of session pt and pt family education provided on proper vehicle transfers in a SUV to best support the R LE and increase safety. Pt and pt family also educated on use of the polar care unit at home and answered discharge questions. Pt and pt spouse verbalized good understanding and reported no further questions about discharge.   Riley Nearing, SPT 12/15/2015, 12:44 PM

## 2015-12-15 NOTE — Progress Notes (Signed)
Physical Therapy Treatment Patient Details Name: Sonya Park MRN: [id number] DOB: October 31, 1940 Today's Date: 12/15/2015    History of Present Illness Pt is 75 y.o. female s/p R TKA secondary to OA 12/13/15.  PMH includes sleep apnea, CPAP, COPD, R breast lumpectomy, gastric bypass x2, L TKR (12 years ago), pubic ramus fx, L3 compression fx.    PT Comments    Pt seen for second time this day. Pt less shaky and general appearance of feeling better. Pt very eager to go home at this time. Pt and pt spouse report that they have 2 steps into the home with a storm door on the R that she is able to use to hold onto. Pt demonstrates safety with 3 steps up using R handrail and L handheld assist by Pts spouse,1 step up using B UE support on RW , 2 steps down using 2 handrails, and down 2 steps using bil UE support on spouses B UE's . Pt and pt spouse verbalize and demonstrate good understanding of proper technique using stairs. Pt also able to demonstrate ambulation min guard for 155 ft without any rest breaks x2. Pt has demonstrated safety to go home with spouses 24 hour support/assistance and home health PT .   Follow Up Recommendations  Home health PT;Supervision/Assistance - 24 hour     Equipment Recommendations   (Pt already owns RW for home use)    Recommendations for Other Services       Precautions / Restrictions Precautions Precautions: Knee;Fall Precaution Comments: KI if not able to SLR independently Restrictions Weight Bearing Restrictions: Yes RLE Weight Bearing: Weight bearing as tolerated    Mobility  Bed Mobility              Transfers Overall transfer level: Needs assistance Equipment used: Rolling walker (2 wheeled) Transfers: Sit to/from Stand Sit to Stand: Supervision         General transfer comment: pt able to complete on first trial with increased time and vc's to stand tall and get balanced prior to ambulation  Ambulation/Gait   Ambulation Distance  (Feet):  (trial 1= 155 ft; trial 2= 155 ft) Assistive device: Rolling walker (2 wheeled) Gait Pattern/deviations: Antalgic Gait velocity: decreased   General Gait Details: requires min cuing to maintain good step pattern when distracted; min cuing to stay within RW   Stairs Stairs: Yes Stairs assistance: Min assist Stair Management: No rails;One rail Right;Step to pattern;Forwards (3 steps up pt used R handrail (to simulate R storm door that pt uses at home to hold onto) and L handheld assist; 1 step up pt used B UE support on the RW ; 2 steps down pt used 2 handrails; and last 2 steps down pt used B UE support on spouses UE's (Pts preferred method).  Number of Stairs: 8 (4 up; 4 down (2 steps into home)) General stair comments: Pt able to demonstrate correct technique; Pt and pt spouse report that they are comfortable with using the stairs as demonstrated.  Wheelchair Mobility    Modified Rankin (Stroke Patients Only)       Balance Overall balance assessment: Modified Independent Sitting-balance support: Feet supported Sitting balance-Leahy Scale: Good     Standing balance support: Bilateral upper extremity supported Standing balance-Leahy Scale: Fair                      Cognition Arousal/Alertness: Awake/alert Behavior During Therapy: WFL for tasks assessed/performed Overall Cognitive Status: Within Functional Limits for tasks  assessed                      Exercises     General Comments General comments (skin integrity, edema, etc.):     Pertinent Vitals/Pain Pain Assessment: No/denies pain (Pt reports "it only hurts a little when I stretch my R knee") Pain Score: Pain Location: R knee Pain Descriptors / Indicators: Sore;Tender Pain Intervention(s): Limited activity within patient's tolerance;Monitored during session;Premedicated before session;Ice applied    Home Living                      Prior Function            PT Goals  (current goals can now be found in the care plan section) Acute Rehab PT Goals Patient Stated Goal: To go home today and sleep. PT Goal Formulation: With patient Time For Goal Achievement: 12/28/15 Potential to Achieve Goals: Good Progress towards PT goals: Progressing toward goals    Frequency  BID    PT Plan Current plan remains appropriate    Co-evaluation             End of Session Equipment Utilized During Treatment: Gait belt Activity Tolerance: Patient tolerated treatment well Patient left: in chair;with call bell/phone within reach;with chair alarm set;with family/visitor present;with SCD's reapplied (polar care (applied and active); towel rolls under B ankles with heels suspended)     Time: 1400-1433 PT Time Calculation (min) (ACUTE ONLY): 33 min  Charges:                   G CodesRiley Nearing, SPT 12/15/2015, 2:55 PM

## 2015-12-15 NOTE — Care Management Important Message (Signed)
Important Message  Patient Details  Name: Sonya Park MRN: [id number] Date of Birth: 11-Mar-1941   Medicare Important Message Given:  Yes    Katrina Stack, RN 12/15/2015, 1:26 PM

## 2015-12-16 DIAGNOSIS — Z471 Aftercare following joint replacement surgery: Secondary | ICD-10-CM | POA: Diagnosis not present

## 2015-12-16 DIAGNOSIS — J449 Chronic obstructive pulmonary disease, unspecified: Secondary | ICD-10-CM | POA: Diagnosis not present

## 2015-12-16 DIAGNOSIS — M810 Age-related osteoporosis without current pathological fracture: Secondary | ICD-10-CM | POA: Diagnosis not present

## 2015-12-16 DIAGNOSIS — M1990 Unspecified osteoarthritis, unspecified site: Secondary | ICD-10-CM | POA: Diagnosis not present

## 2015-12-16 DIAGNOSIS — Z8781 Personal history of (healed) traumatic fracture: Secondary | ICD-10-CM | POA: Diagnosis not present

## 2015-12-16 DIAGNOSIS — Z96653 Presence of artificial knee joint, bilateral: Secondary | ICD-10-CM | POA: Diagnosis not present

## 2015-12-18 DIAGNOSIS — Z8781 Personal history of (healed) traumatic fracture: Secondary | ICD-10-CM | POA: Diagnosis not present

## 2015-12-18 DIAGNOSIS — M810 Age-related osteoporosis without current pathological fracture: Secondary | ICD-10-CM | POA: Diagnosis not present

## 2015-12-18 DIAGNOSIS — Z96653 Presence of artificial knee joint, bilateral: Secondary | ICD-10-CM | POA: Diagnosis not present

## 2015-12-18 DIAGNOSIS — Z471 Aftercare following joint replacement surgery: Secondary | ICD-10-CM | POA: Diagnosis not present

## 2015-12-18 DIAGNOSIS — M1990 Unspecified osteoarthritis, unspecified site: Secondary | ICD-10-CM | POA: Diagnosis not present

## 2015-12-18 DIAGNOSIS — J449 Chronic obstructive pulmonary disease, unspecified: Secondary | ICD-10-CM | POA: Diagnosis not present

## 2015-12-20 DIAGNOSIS — M810 Age-related osteoporosis without current pathological fracture: Secondary | ICD-10-CM | POA: Diagnosis not present

## 2015-12-20 DIAGNOSIS — Z8781 Personal history of (healed) traumatic fracture: Secondary | ICD-10-CM | POA: Diagnosis not present

## 2015-12-20 DIAGNOSIS — Z96653 Presence of artificial knee joint, bilateral: Secondary | ICD-10-CM | POA: Diagnosis not present

## 2015-12-20 DIAGNOSIS — M1990 Unspecified osteoarthritis, unspecified site: Secondary | ICD-10-CM | POA: Diagnosis not present

## 2015-12-20 DIAGNOSIS — Z471 Aftercare following joint replacement surgery: Secondary | ICD-10-CM | POA: Diagnosis not present

## 2015-12-20 DIAGNOSIS — J449 Chronic obstructive pulmonary disease, unspecified: Secondary | ICD-10-CM | POA: Diagnosis not present

## 2015-12-22 DIAGNOSIS — Z8781 Personal history of (healed) traumatic fracture: Secondary | ICD-10-CM | POA: Diagnosis not present

## 2015-12-22 DIAGNOSIS — Z96653 Presence of artificial knee joint, bilateral: Secondary | ICD-10-CM | POA: Diagnosis not present

## 2015-12-22 DIAGNOSIS — M1990 Unspecified osteoarthritis, unspecified site: Secondary | ICD-10-CM | POA: Diagnosis not present

## 2015-12-22 DIAGNOSIS — M810 Age-related osteoporosis without current pathological fracture: Secondary | ICD-10-CM | POA: Diagnosis not present

## 2015-12-22 DIAGNOSIS — Z471 Aftercare following joint replacement surgery: Secondary | ICD-10-CM | POA: Diagnosis not present

## 2015-12-22 DIAGNOSIS — J449 Chronic obstructive pulmonary disease, unspecified: Secondary | ICD-10-CM | POA: Diagnosis not present

## 2015-12-25 DIAGNOSIS — M1990 Unspecified osteoarthritis, unspecified site: Secondary | ICD-10-CM | POA: Diagnosis not present

## 2015-12-25 DIAGNOSIS — M810 Age-related osteoporosis without current pathological fracture: Secondary | ICD-10-CM | POA: Diagnosis not present

## 2015-12-25 DIAGNOSIS — Z8781 Personal history of (healed) traumatic fracture: Secondary | ICD-10-CM | POA: Diagnosis not present

## 2015-12-25 DIAGNOSIS — Z96653 Presence of artificial knee joint, bilateral: Secondary | ICD-10-CM | POA: Diagnosis not present

## 2015-12-25 DIAGNOSIS — J449 Chronic obstructive pulmonary disease, unspecified: Secondary | ICD-10-CM | POA: Diagnosis not present

## 2015-12-25 DIAGNOSIS — Z471 Aftercare following joint replacement surgery: Secondary | ICD-10-CM | POA: Diagnosis not present

## 2015-12-27 DIAGNOSIS — M810 Age-related osteoporosis without current pathological fracture: Secondary | ICD-10-CM | POA: Diagnosis not present

## 2015-12-27 DIAGNOSIS — M1990 Unspecified osteoarthritis, unspecified site: Secondary | ICD-10-CM | POA: Diagnosis not present

## 2015-12-27 DIAGNOSIS — Z471 Aftercare following joint replacement surgery: Secondary | ICD-10-CM | POA: Diagnosis not present

## 2015-12-27 DIAGNOSIS — J449 Chronic obstructive pulmonary disease, unspecified: Secondary | ICD-10-CM | POA: Diagnosis not present

## 2015-12-27 DIAGNOSIS — Z8781 Personal history of (healed) traumatic fracture: Secondary | ICD-10-CM | POA: Diagnosis not present

## 2015-12-27 DIAGNOSIS — Z96653 Presence of artificial knee joint, bilateral: Secondary | ICD-10-CM | POA: Diagnosis not present

## 2015-12-28 DIAGNOSIS — M810 Age-related osteoporosis without current pathological fracture: Secondary | ICD-10-CM | POA: Diagnosis not present

## 2015-12-28 DIAGNOSIS — Z471 Aftercare following joint replacement surgery: Secondary | ICD-10-CM | POA: Diagnosis not present

## 2015-12-28 DIAGNOSIS — Z8781 Personal history of (healed) traumatic fracture: Secondary | ICD-10-CM | POA: Diagnosis not present

## 2015-12-28 DIAGNOSIS — M1990 Unspecified osteoarthritis, unspecified site: Secondary | ICD-10-CM | POA: Diagnosis not present

## 2015-12-28 DIAGNOSIS — J449 Chronic obstructive pulmonary disease, unspecified: Secondary | ICD-10-CM | POA: Diagnosis not present

## 2015-12-28 DIAGNOSIS — Z96653 Presence of artificial knee joint, bilateral: Secondary | ICD-10-CM | POA: Diagnosis not present

## 2015-12-29 DIAGNOSIS — Z96651 Presence of right artificial knee joint: Secondary | ICD-10-CM | POA: Diagnosis not present

## 2016-01-01 DIAGNOSIS — Z96651 Presence of right artificial knee joint: Secondary | ICD-10-CM | POA: Diagnosis not present

## 2016-01-05 DIAGNOSIS — Z96651 Presence of right artificial knee joint: Secondary | ICD-10-CM | POA: Diagnosis not present

## 2016-01-08 DIAGNOSIS — Z96651 Presence of right artificial knee joint: Secondary | ICD-10-CM | POA: Diagnosis not present

## 2016-01-10 DIAGNOSIS — Z96651 Presence of right artificial knee joint: Secondary | ICD-10-CM | POA: Diagnosis not present

## 2016-01-12 DIAGNOSIS — Z96651 Presence of right artificial knee joint: Secondary | ICD-10-CM | POA: Diagnosis not present

## 2016-01-15 DIAGNOSIS — Z96651 Presence of right artificial knee joint: Secondary | ICD-10-CM | POA: Diagnosis not present

## 2016-01-17 DIAGNOSIS — Z96651 Presence of right artificial knee joint: Secondary | ICD-10-CM | POA: Diagnosis not present

## 2016-01-19 DIAGNOSIS — Z96651 Presence of right artificial knee joint: Secondary | ICD-10-CM | POA: Diagnosis not present

## 2016-01-24 DIAGNOSIS — Z96651 Presence of right artificial knee joint: Secondary | ICD-10-CM | POA: Diagnosis not present

## 2016-01-25 DIAGNOSIS — Z96651 Presence of right artificial knee joint: Secondary | ICD-10-CM | POA: Diagnosis not present

## 2016-01-26 DIAGNOSIS — Z96651 Presence of right artificial knee joint: Secondary | ICD-10-CM | POA: Diagnosis not present

## 2016-01-29 DIAGNOSIS — Z96651 Presence of right artificial knee joint: Secondary | ICD-10-CM | POA: Diagnosis not present

## 2016-01-30 DIAGNOSIS — Z471 Aftercare following joint replacement surgery: Secondary | ICD-10-CM | POA: Diagnosis not present

## 2016-01-31 DIAGNOSIS — Z96651 Presence of right artificial knee joint: Secondary | ICD-10-CM | POA: Diagnosis not present

## 2016-02-05 DIAGNOSIS — Z96651 Presence of right artificial knee joint: Secondary | ICD-10-CM | POA: Diagnosis not present

## 2016-02-07 DIAGNOSIS — Z96651 Presence of right artificial knee joint: Secondary | ICD-10-CM | POA: Diagnosis not present

## 2016-02-12 DIAGNOSIS — Z96651 Presence of right artificial knee joint: Secondary | ICD-10-CM | POA: Diagnosis not present

## 2016-02-14 DIAGNOSIS — Z96651 Presence of right artificial knee joint: Secondary | ICD-10-CM | POA: Diagnosis not present

## 2016-02-19 DIAGNOSIS — Z96651 Presence of right artificial knee joint: Secondary | ICD-10-CM | POA: Diagnosis not present

## 2016-02-28 DIAGNOSIS — D2261 Melanocytic nevi of right upper limb, including shoulder: Secondary | ICD-10-CM | POA: Diagnosis not present

## 2016-02-28 DIAGNOSIS — D1801 Hemangioma of skin and subcutaneous tissue: Secondary | ICD-10-CM | POA: Diagnosis not present

## 2016-02-28 DIAGNOSIS — D2371 Other benign neoplasm of skin of right lower limb, including hip: Secondary | ICD-10-CM | POA: Diagnosis not present

## 2016-02-28 DIAGNOSIS — D225 Melanocytic nevi of trunk: Secondary | ICD-10-CM | POA: Diagnosis not present

## 2016-02-28 DIAGNOSIS — Z85828 Personal history of other malignant neoplasm of skin: Secondary | ICD-10-CM | POA: Diagnosis not present

## 2016-02-28 DIAGNOSIS — L821 Other seborrheic keratosis: Secondary | ICD-10-CM | POA: Diagnosis not present

## 2016-02-28 DIAGNOSIS — D2262 Melanocytic nevi of left upper limb, including shoulder: Secondary | ICD-10-CM | POA: Diagnosis not present

## 2016-03-15 DIAGNOSIS — I10 Essential (primary) hypertension: Secondary | ICD-10-CM | POA: Diagnosis not present

## 2016-03-15 DIAGNOSIS — R5381 Other malaise: Secondary | ICD-10-CM | POA: Diagnosis not present

## 2016-03-15 DIAGNOSIS — E784 Other hyperlipidemia: Secondary | ICD-10-CM | POA: Diagnosis not present

## 2016-03-15 DIAGNOSIS — E034 Atrophy of thyroid (acquired): Secondary | ICD-10-CM | POA: Diagnosis not present

## 2016-03-19 DIAGNOSIS — E441 Mild protein-calorie malnutrition: Secondary | ICD-10-CM | POA: Diagnosis not present

## 2016-03-19 DIAGNOSIS — Q684 Congenital bowing of tibia and fibula: Secondary | ICD-10-CM | POA: Diagnosis not present

## 2016-03-19 DIAGNOSIS — K272 Acute peptic ulcer, site unspecified, with both hemorrhage and perforation: Secondary | ICD-10-CM | POA: Diagnosis not present

## 2016-03-19 DIAGNOSIS — S329XXA Fracture of unspecified parts of lumbosacral spine and pelvis, initial encounter for closed fracture: Secondary | ICD-10-CM | POA: Diagnosis not present

## 2016-08-12 DIAGNOSIS — J069 Acute upper respiratory infection, unspecified: Secondary | ICD-10-CM | POA: Diagnosis not present

## 2017-01-14 ENCOUNTER — Other Ambulatory Visit: Payer: Self-pay

## 2017-01-14 ENCOUNTER — Emergency Department: Payer: Medicare Other

## 2017-01-14 ENCOUNTER — Other Ambulatory Visit: Payer: Self-pay | Admitting: Internal Medicine

## 2017-01-14 ENCOUNTER — Inpatient Hospital Stay
Admission: EM | Admit: 2017-01-14 | Discharge: 2017-02-11 | DRG: 870 | Disposition: A | Discharge status: Skilled Nursing Facility | Payer: Medicare Other | Attending: Internal Medicine | Admitting: Internal Medicine

## 2017-01-14 ENCOUNTER — Encounter: Payer: Self-pay | Admitting: *Deleted

## 2017-01-14 ENCOUNTER — Ambulatory Visit
Admission: RE | Admit: 2017-01-14 | Discharge: 2017-01-14 | Disposition: A | Discharge status: Home / Self Care | Payer: Medicare Other | Source: Ambulatory Visit | Attending: Internal Medicine | Admitting: Internal Medicine

## 2017-01-14 DIAGNOSIS — D539 Nutritional anemia, unspecified: Secondary | ICD-10-CM

## 2017-01-14 DIAGNOSIS — R319 Hematuria, unspecified: Secondary | ICD-10-CM | POA: Diagnosis not present

## 2017-01-14 DIAGNOSIS — Z96653 Presence of artificial knee joint, bilateral: Secondary | ICD-10-CM | POA: Diagnosis present

## 2017-01-14 DIAGNOSIS — W19XXXA Unspecified fall, initial encounter: Secondary | ICD-10-CM

## 2017-01-14 DIAGNOSIS — J9601 Acute respiratory failure with hypoxia: Secondary | ICD-10-CM | POA: Diagnosis present

## 2017-01-14 DIAGNOSIS — E162 Hypoglycemia, unspecified: Secondary | ICD-10-CM | POA: Diagnosis present

## 2017-01-14 DIAGNOSIS — M898X8 Other specified disorders of bone, other site: Secondary | ICD-10-CM | POA: Insufficient documentation

## 2017-01-14 DIAGNOSIS — T884XXA Failed or difficult intubation, initial encounter: Secondary | ICD-10-CM | POA: Diagnosis not present

## 2017-01-14 DIAGNOSIS — Z9884 Bariatric surgery status: Secondary | ICD-10-CM

## 2017-01-14 DIAGNOSIS — E8770 Fluid overload, unspecified: Secondary | ICD-10-CM | POA: Diagnosis present

## 2017-01-14 DIAGNOSIS — S0990XA Unspecified injury of head, initial encounter: Secondary | ICD-10-CM

## 2017-01-14 DIAGNOSIS — Z4659 Encounter for fitting and adjustment of other gastrointestinal appliance and device: Secondary | ICD-10-CM

## 2017-01-14 DIAGNOSIS — E876 Hypokalemia: Secondary | ICD-10-CM | POA: Diagnosis present

## 2017-01-14 DIAGNOSIS — M4854XA Collapsed vertebra, not elsewhere classified, thoracic region, initial encounter for fracture: Secondary | ICD-10-CM | POA: Diagnosis present

## 2017-01-14 DIAGNOSIS — G4700 Insomnia, unspecified: Secondary | ICD-10-CM | POA: Diagnosis present

## 2017-01-14 DIAGNOSIS — K8010 Calculus of gallbladder with chronic cholecystitis without obstruction: Secondary | ICD-10-CM | POA: Diagnosis present

## 2017-01-14 DIAGNOSIS — R601 Generalized edema: Secondary | ICD-10-CM | POA: Diagnosis not present

## 2017-01-14 DIAGNOSIS — R7881 Bacteremia: Secondary | ICD-10-CM

## 2017-01-14 DIAGNOSIS — E872 Acidosis: Secondary | ICD-10-CM | POA: Diagnosis present

## 2017-01-14 DIAGNOSIS — Z515 Encounter for palliative care: Secondary | ICD-10-CM | POA: Diagnosis not present

## 2017-01-14 DIAGNOSIS — D61818 Other pancytopenia: Secondary | ICD-10-CM | POA: Diagnosis present

## 2017-01-14 DIAGNOSIS — E2740 Unspecified adrenocortical insufficiency: Secondary | ICD-10-CM | POA: Diagnosis present

## 2017-01-14 DIAGNOSIS — G4701 Insomnia due to medical condition: Secondary | ICD-10-CM | POA: Diagnosis present

## 2017-01-14 DIAGNOSIS — S0083XA Contusion of other part of head, initial encounter: Secondary | ICD-10-CM | POA: Diagnosis present

## 2017-01-14 DIAGNOSIS — R402364 Coma scale, best motor response, obeys commands, 24 hours or more after hospital admission: Secondary | ICD-10-CM | POA: Diagnosis not present

## 2017-01-14 DIAGNOSIS — Z23 Encounter for immunization: Secondary | ICD-10-CM | POA: Diagnosis present

## 2017-01-14 DIAGNOSIS — M810 Age-related osteoporosis without current pathological fracture: Secondary | ICD-10-CM | POA: Diagnosis not present

## 2017-01-14 DIAGNOSIS — L03116 Cellulitis of left lower limb: Secondary | ICD-10-CM | POA: Diagnosis present

## 2017-01-14 DIAGNOSIS — G9340 Encephalopathy, unspecified: Secondary | ICD-10-CM | POA: Diagnosis not present

## 2017-01-14 DIAGNOSIS — A419 Sepsis, unspecified organism: Secondary | ICD-10-CM | POA: Diagnosis present

## 2017-01-14 DIAGNOSIS — D72819 Decreased white blood cell count, unspecified: Secondary | ICD-10-CM

## 2017-01-14 DIAGNOSIS — F1020 Alcohol dependence, uncomplicated: Secondary | ICD-10-CM | POA: Diagnosis present

## 2017-01-14 DIAGNOSIS — L97229 Non-pressure chronic ulcer of left calf with unspecified severity: Secondary | ICD-10-CM | POA: Diagnosis present

## 2017-01-14 DIAGNOSIS — K7290 Hepatic failure, unspecified without coma: Secondary | ICD-10-CM | POA: Diagnosis not present

## 2017-01-14 DIAGNOSIS — L299 Pruritus, unspecified: Secondary | ICD-10-CM | POA: Diagnosis not present

## 2017-01-14 DIAGNOSIS — Z79818 Long term (current) use of other agents affecting estrogen receptors and estrogen levels: Secondary | ICD-10-CM

## 2017-01-14 DIAGNOSIS — Z880 Allergy status to penicillin: Secondary | ICD-10-CM

## 2017-01-14 DIAGNOSIS — D520 Dietary folate deficiency anemia: Secondary | ICD-10-CM

## 2017-01-14 DIAGNOSIS — R6521 Severe sepsis with septic shock: Secondary | ICD-10-CM | POA: Diagnosis present

## 2017-01-14 DIAGNOSIS — Z9181 History of falling: Secondary | ICD-10-CM | POA: Diagnosis not present

## 2017-01-14 DIAGNOSIS — E43 Unspecified severe protein-calorie malnutrition: Secondary | ICD-10-CM | POA: Diagnosis present

## 2017-01-14 DIAGNOSIS — Z77120 Contact with and (suspected) exposure to mold (toxic): Secondary | ICD-10-CM

## 2017-01-14 DIAGNOSIS — Z79899 Other long term (current) drug therapy: Secondary | ICD-10-CM

## 2017-01-14 DIAGNOSIS — K709 Alcoholic liver disease, unspecified: Secondary | ICD-10-CM | POA: Diagnosis not present

## 2017-01-14 DIAGNOSIS — D709 Neutropenia, unspecified: Secondary | ICD-10-CM | POA: Diagnosis present

## 2017-01-14 DIAGNOSIS — L0390 Cellulitis, unspecified: Secondary | ICD-10-CM

## 2017-01-14 DIAGNOSIS — F0390 Unspecified dementia without behavioral disturbance: Secondary | ICD-10-CM | POA: Diagnosis present

## 2017-01-14 DIAGNOSIS — L89301 Pressure ulcer of unspecified buttock, stage 1: Secondary | ICD-10-CM | POA: Diagnosis present

## 2017-01-14 DIAGNOSIS — E870 Hyperosmolality and hypernatremia: Secondary | ICD-10-CM | POA: Diagnosis present

## 2017-01-14 DIAGNOSIS — Z6822 Body mass index (BMI) 22.0-22.9, adult: Secondary | ICD-10-CM

## 2017-01-14 DIAGNOSIS — F1010 Alcohol abuse, uncomplicated: Secondary | ICD-10-CM | POA: Diagnosis present

## 2017-01-14 DIAGNOSIS — J9690 Respiratory failure, unspecified, unspecified whether with hypoxia or hypercapnia: Secondary | ICD-10-CM

## 2017-01-14 DIAGNOSIS — D696 Thrombocytopenia, unspecified: Secondary | ICD-10-CM

## 2017-01-14 DIAGNOSIS — J9600 Acute respiratory failure, unspecified whether with hypoxia or hypercapnia: Secondary | ICD-10-CM | POA: Diagnosis not present

## 2017-01-14 DIAGNOSIS — K7040 Alcoholic hepatic failure without coma: Secondary | ICD-10-CM

## 2017-01-14 DIAGNOSIS — E610 Copper deficiency: Secondary | ICD-10-CM | POA: Diagnosis not present

## 2017-01-14 DIAGNOSIS — K7011 Alcoholic hepatitis with ascites: Secondary | ICD-10-CM | POA: Diagnosis present

## 2017-01-14 DIAGNOSIS — R1310 Dysphagia, unspecified: Secondary | ICD-10-CM

## 2017-01-14 DIAGNOSIS — R4702 Dysphasia: Secondary | ICD-10-CM | POA: Diagnosis present

## 2017-01-14 DIAGNOSIS — R945 Abnormal results of liver function studies: Secondary | ICD-10-CM

## 2017-01-14 DIAGNOSIS — G319 Degenerative disease of nervous system, unspecified: Secondary | ICD-10-CM | POA: Insufficient documentation

## 2017-01-14 DIAGNOSIS — K760 Fatty (change of) liver, not elsewhere classified: Secondary | ICD-10-CM | POA: Diagnosis present

## 2017-01-14 DIAGNOSIS — R627 Adult failure to thrive: Secondary | ICD-10-CM | POA: Diagnosis not present

## 2017-01-14 DIAGNOSIS — T796XXA Traumatic ischemia of muscle, initial encounter: Secondary | ICD-10-CM

## 2017-01-14 DIAGNOSIS — W109XXA Fall (on) (from) unspecified stairs and steps, initial encounter: Secondary | ICD-10-CM | POA: Diagnosis present

## 2017-01-14 DIAGNOSIS — E871 Hypo-osmolality and hyponatremia: Secondary | ICD-10-CM | POA: Diagnosis present

## 2017-01-14 DIAGNOSIS — I6782 Cerebral ischemia: Secondary | ICD-10-CM

## 2017-01-14 DIAGNOSIS — R402144 Coma scale, eyes open, spontaneous, 24 hours or more after hospital admission: Secondary | ICD-10-CM | POA: Diagnosis not present

## 2017-01-14 DIAGNOSIS — R7989 Other specified abnormal findings of blood chemistry: Secondary | ICD-10-CM

## 2017-01-14 DIAGNOSIS — S0993XA Unspecified injury of face, initial encounter: Secondary | ICD-10-CM | POA: Diagnosis present

## 2017-01-14 DIAGNOSIS — Z7189 Other specified counseling: Secondary | ICD-10-CM

## 2017-01-14 DIAGNOSIS — D62 Acute posthemorrhagic anemia: Secondary | ICD-10-CM | POA: Diagnosis present

## 2017-01-14 DIAGNOSIS — G9341 Metabolic encephalopathy: Secondary | ICD-10-CM | POA: Diagnosis present

## 2017-01-14 DIAGNOSIS — K529 Noninfective gastroenteritis and colitis, unspecified: Secondary | ICD-10-CM | POA: Diagnosis present

## 2017-01-14 DIAGNOSIS — R739 Hyperglycemia, unspecified: Secondary | ICD-10-CM | POA: Diagnosis present

## 2017-01-14 DIAGNOSIS — J90 Pleural effusion, not elsewhere classified: Secondary | ICD-10-CM | POA: Diagnosis present

## 2017-01-14 DIAGNOSIS — J029 Acute pharyngitis, unspecified: Secondary | ICD-10-CM | POA: Diagnosis present

## 2017-01-14 DIAGNOSIS — L8990 Pressure ulcer of unspecified site, unspecified stage: Secondary | ICD-10-CM | POA: Insufficient documentation

## 2017-01-14 DIAGNOSIS — M1990 Unspecified osteoarthritis, unspecified site: Secondary | ICD-10-CM | POA: Diagnosis present

## 2017-01-14 DIAGNOSIS — S022XXA Fracture of nasal bones, initial encounter for closed fracture: Secondary | ICD-10-CM

## 2017-01-14 DIAGNOSIS — R5381 Other malaise: Secondary | ICD-10-CM | POA: Diagnosis not present

## 2017-01-14 DIAGNOSIS — R531 Weakness: Secondary | ICD-10-CM | POA: Diagnosis not present

## 2017-01-14 DIAGNOSIS — S06303A Unspecified focal traumatic brain injury with loss of consciousness of 1 hour to 5 hours 59 minutes, initial encounter: Secondary | ICD-10-CM | POA: Diagnosis present

## 2017-01-14 DIAGNOSIS — A4151 Sepsis due to Escherichia coli [E. coli]: Principal | ICD-10-CM | POA: Diagnosis present

## 2017-01-14 DIAGNOSIS — R609 Edema, unspecified: Secondary | ICD-10-CM

## 2017-01-14 DIAGNOSIS — D7589 Other specified diseases of blood and blood-forming organs: Secondary | ICD-10-CM | POA: Diagnosis present

## 2017-01-14 DIAGNOSIS — Z87891 Personal history of nicotine dependence: Secondary | ICD-10-CM

## 2017-01-14 DIAGNOSIS — Z7982 Long term (current) use of aspirin: Secondary | ICD-10-CM

## 2017-01-14 DIAGNOSIS — Z8249 Family history of ischemic heart disease and other diseases of the circulatory system: Secondary | ICD-10-CM

## 2017-01-14 DIAGNOSIS — R197 Diarrhea, unspecified: Secondary | ICD-10-CM | POA: Diagnosis not present

## 2017-01-14 DIAGNOSIS — Z1211 Encounter for screening for malignant neoplasm of colon: Secondary | ICD-10-CM

## 2017-01-14 DIAGNOSIS — Z0189 Encounter for other specified special examinations: Secondary | ICD-10-CM

## 2017-01-14 DIAGNOSIS — Y92008 Other place in unspecified non-institutional (private) residence as the place of occurrence of the external cause: Secondary | ICD-10-CM | POA: Diagnosis not present

## 2017-01-14 DIAGNOSIS — Z90710 Acquired absence of both cervix and uterus: Secondary | ICD-10-CM

## 2017-01-14 DIAGNOSIS — R402254 Coma scale, best verbal response, oriented, 24 hours or more after hospital admission: Secondary | ICD-10-CM | POA: Diagnosis not present

## 2017-01-14 DIAGNOSIS — Z452 Encounter for adjustment and management of vascular access device: Secondary | ICD-10-CM

## 2017-01-14 DIAGNOSIS — D509 Iron deficiency anemia, unspecified: Secondary | ICD-10-CM | POA: Diagnosis not present

## 2017-01-14 LAB — CBC WITH DIFFERENTIAL/PLATELET
Basophils Absolute: 0 10*3/uL (ref 0–0.1)
Basophils Relative: 1 %
EOS PCT: 0 %
Eosinophils Absolute: 0 10*3/uL (ref 0–0.7)
HEMATOCRIT: 31.8 % — AB (ref 35.0–47.0)
Hemoglobin: 10.8 g/dL — ABNORMAL LOW (ref 12.0–16.0)
LYMPHS PCT: 26 %
Lymphs Abs: 0.5 10*3/uL — ABNORMAL LOW (ref 1.0–3.6)
MCH: 36.5 pg — ABNORMAL HIGH (ref 26.0–34.0)
MCHC: 34.1 g/dL (ref 32.0–36.0)
MCV: 107 fL — AB (ref 80.0–100.0)
MONOS PCT: 5 %
Monocytes Absolute: 0.1 10*3/uL — ABNORMAL LOW (ref 0.2–0.9)
NEUTROS ABS: 1.3 10*3/uL — AB (ref 1.4–6.5)
Neutrophils Relative %: 68 %
PLATELETS: 148 10*3/uL — AB (ref 150–440)
RBC: 2.97 MIL/uL — ABNORMAL LOW (ref 3.80–5.20)
RDW: 16.3 % — ABNORMAL HIGH (ref 11.5–14.5)
WBC: 1.9 10*3/uL — ABNORMAL LOW (ref 3.6–11.0)

## 2017-01-14 LAB — COMPREHENSIVE METABOLIC PANEL
ALT: 61 U/L — ABNORMAL HIGH (ref 14–54)
ANION GAP: 14 (ref 5–15)
AST: 137 U/L — ABNORMAL HIGH (ref 15–41)
Albumin: 2.5 g/dL — ABNORMAL LOW (ref 3.5–5.0)
Alkaline Phosphatase: 126 U/L (ref 38–126)
BILIRUBIN TOTAL: 3.8 mg/dL — AB (ref 0.3–1.2)
BUN: 13 mg/dL (ref 6–20)
CALCIUM: 7.6 mg/dL — AB (ref 8.9–10.3)
CHLORIDE: 98 mmol/L — AB (ref 101–111)
CO2: 24 mmol/L (ref 22–32)
Creatinine, Ser: 1.07 mg/dL — ABNORMAL HIGH (ref 0.44–1.00)
GFR, EST AFRICAN AMERICAN: 57 mL/min — AB (ref >60)
GFR, EST NON AFRICAN AMERICAN: 49 mL/min — AB (ref >60)
GLUCOSE: 97 mg/dL (ref 65–99)
POTASSIUM: 4.1 mmol/L (ref 3.5–5.1)
Sodium: 136 mmol/L (ref 135–145)
TOTAL PROTEIN: 4.9 g/dL — AB (ref 6.5–8.1)

## 2017-01-14 LAB — CK: Total CK: 569 U/L — ABNORMAL HIGH (ref 38–234)

## 2017-01-14 LAB — PROTIME-INR
INR: 1.29
PROTHROMBIN TIME: 16 s — AB (ref 11.4–15.2)

## 2017-01-14 LAB — TROPONIN I

## 2017-01-14 LAB — LACTIC ACID, PLASMA: LACTIC ACID, VENOUS: 6.7 mmol/L — AB (ref 0.5–1.9)

## 2017-01-14 MED ORDER — MEROPENEM 1 G IV SOLR
1.0000 g | Freq: Two times a day (BID) | INTRAVENOUS | Status: DC
  Administered 2017-01-15 – 2017-01-16 (×5): 1 g via INTRAVENOUS
  Filled 2017-01-14 (×7): qty 1

## 2017-01-14 MED ORDER — DEXTROSE 5 % IV SOLN
2.0000 g | Freq: Once | INTRAVENOUS | Status: DC
  Filled 2017-01-14: qty 2

## 2017-01-14 MED ORDER — VANCOMYCIN HCL IN DEXTROSE 1-5 GM/200ML-% IV SOLN
1000.0000 mg | Freq: Once | INTRAVENOUS | Status: AC
  Administered 2017-01-14: 1000 mg via INTRAVENOUS
  Filled 2017-01-14: qty 200

## 2017-01-14 MED ORDER — ONDANSETRON HCL 4 MG/2ML IJ SOLN
4.0000 mg | Freq: Once | INTRAMUSCULAR | Status: AC
  Administered 2017-01-14: 4 mg via INTRAVENOUS
  Filled 2017-01-14: qty 2

## 2017-01-14 MED ORDER — MORPHINE SULFATE (PF) 2 MG/ML IV SOLN
2.0000 mg | Freq: Once | INTRAVENOUS | Status: AC
  Administered 2017-01-14: 2 mg via INTRAVENOUS
  Filled 2017-01-14: qty 1

## 2017-01-14 MED ORDER — SODIUM CHLORIDE 0.9 % IV BOLUS (SEPSIS)
1000.0000 mL | Freq: Once | INTRAVENOUS | Status: AC
  Administered 2017-01-14: 1000 mL via INTRAVENOUS

## 2017-01-14 MED ORDER — LORAZEPAM 1 MG PO TABS
1.0000 mg | ORAL_TABLET | Freq: Once | ORAL | Status: AC
  Administered 2017-01-14: 1 mg via ORAL
  Filled 2017-01-14: qty 1

## 2017-01-14 MED ORDER — LEVOFLOXACIN IN D5W 750 MG/150ML IV SOLN
750.0000 mg | Freq: Once | INTRAVENOUS | Status: AC
  Administered 2017-01-14: 750 mg via INTRAVENOUS
  Filled 2017-01-14: qty 150

## 2017-01-14 MED ORDER — BACITRACIN ZINC 500 UNIT/GM EX OINT
TOPICAL_OINTMENT | CUTANEOUS | Status: AC
  Administered 2017-01-14: 1
  Filled 2017-01-14: qty 0.9

## 2017-01-14 MED ORDER — IOPAMIDOL (ISOVUE-300) INJECTION 61%
100.0000 mL | Freq: Once | INTRAVENOUS | Status: AC | PRN
  Administered 2017-01-14: 100 mL via INTRAVENOUS

## 2017-01-14 NOTE — ED Notes (Signed)
Pt back from CT

## 2017-01-14 NOTE — H&P (Signed)
[facility] NAME: Sonya Park    MR#:  [id number]  DATE OF BIRTH:  12/12/1940  DATE OF ADMISSION:  01/14/2017  PRIMARY CARE PHYSICIAN: Cletis Athens, MD   REQUESTING/REFERRING PHYSICIAN: Clearnce Hasten, MD  CHIEF COMPLAINT:   Chief Complaint  Patient presents with  . Fall    HISTORY OF PRESENT ILLNESS:  Sonya Park  is a 76 y.o. female who presents with A fall and significant facial trauma, and found here to meet sepsis criteria. Patient is unable to contribute much information to her history of present illness, despite the fact that she is able to converse. History is given mostly by her husband who is at bedside. He states that he found the patient after she had fallen down outside steps at their house. He states that they're currently sleeping in an RV as they await repair of mold in their house. The patient had gotten up from the RV to go use the bathroom in the house, and fell at some point. Husband found her sometime later unconscious and face down and bleeding significantly. Husband states that she has been having some waxing and waning weakness for the past month or so. Patient states that she is not sure if she's had any overt urinary symptoms. Afterwards outside the room incompetence the husband stated that she does also drink wine fairly extensively. He states that she oftentimes starts drinking around lunch time and will have anywhere between 3-5 equivalent glasses of wine each day. CT maxillofacial and had here show no overt fractures, but suspected microfracture as she does have some hemorrhage into facial sinus. She is found to be mildly neutropenia and here with an ANC of 1300, white count 1.9, tachycardic. Source for sepsis is not clearly elucidated in the ED workup, but her lactic acid was 6.7 and her blood pressure became hypotensive in the ED. She was started on antibiotics and fluid resuscitation per sepsis protocol and  hospitalists were called for admission.  PAST MEDICAL HISTORY:   Past Medical History:  Diagnosis Date  . Arthritis   . Back pain   . Insomnia   . Medical history non-contributory   . Osteoporosis     PAST SURGICAL HISTORY:   Past Surgical History:  Procedure Laterality Date  . ABDOMINAL HYSTERECTOMY    . BREAST LUMPECTOMY Right   . COLONOSCOPY WITH PROPOFOL N/A 07/18/2015   Procedure: COLONOSCOPY WITH PROPOFOL;  Surgeon: [name], MD;  Location: [facility] ENDOSCOPY;  Service: Endoscopy;  Laterality: N/A;  . COSMETIC SURGERY    . GASTRIC BYPASS     X2  . HEMORROIDECTOMY    . KNEE ARTHROPLASTY Right 12/13/2015   Procedure: COMPUTER ASSISTED TOTAL KNEE ARTHROPLASTY;  Surgeon: Dereck Leep, MD;  Location: [facility] ORS;  Service: Orthopedics;  Laterality: Right;  . KNEE ARTHROSCOPY    . REPLACEMENT TOTAL KNEE Left     SOCIAL HISTORY:   Social History  Substance Use Topics  . Smoking status: Former Smoker    Quit date: 11/30/1994  . Smokeless tobacco: Never Used  . Alcohol use 12.6 oz/week    21 Glasses of wine per week     Comment: 3 glasses of wine/ day, none in 2 days    FAMILY HISTORY:   Family History  Problem Relation Age of Onset  . Liver disease Mother   . Heart attack Father   . Cancer Father   . CAD Sister     DRUG  ALLERGIES:   Allergies  Allergen Reactions  . Other Itching    "mycin" doesn't know which one  . Penicillins Swelling    Has patient had a PCN reaction causing immediate rash, facial/tongue/throat swelling, SOB or lightheadedness with hypotension: Yes Has patient had a PCN reaction causing severe rash involving mucus membranes or skin necrosis: No Has patient had a PCN reaction that required hospitalization: No Has patient had a PCN reaction occurring within the last 10 years: No If all of the above answers are "NO", then may proceed with Cephalosporin use.    MEDICATIONS AT HOME:   Prior to Admission medications   Medication Sig Start  Date End Date Taking? Authorizing Provider  aspirin 81 MG tablet Take 81 mg by mouth daily.   Yes [provider]  CALCIUM CARBONATE PO Take 1 tablet by mouth daily.    Yes [provider]  cholecalciferol (VITAMIN D) 1000 UNITS tablet Take 2,000 Units by mouth daily.    Yes [provider]  estrogens, conjugated, (PREMARIN) 0.625 MG tablet Take 0.625 mg by mouth daily. Take daily for 21 days then do not take for 7 days.   Yes [provider]  ibandronate (BONIVA) 150 MG tablet Take 150 mg by mouth every 30 (thirty) days. Take in the morning with a full glass of water, on an empty stomach, and do not take anything else by mouth or lie down for the next 30 min.   Yes [provider]  megestrol (MEGACE) 20 MG tablet Take 1 tablet by mouth daily. 12/30/16 01/29/17 Yes [provider]  Probiotic Product (DIGESTIVE ADVANTAGE GUMMIES) CHEW Chew 3 Doses by mouth daily.    Yes [provider]  clindamycin (CLEOCIN) 150 MG capsule Take 600 mg by mouth daily as needed. Dental work    Secondary school teacher, Historical, MD  DiphenhydrAMINE HCl (ZZZQUIL) 50 MG/30ML LIQD Take 30 mLs by mouth at bedtime as needed (for sleep).    [provider]  oxyCODONE (OXY IR/ROXICODONE) 5 MG immediate release tablet Take 1-2 tablets (5-10 mg total) by mouth every 4 (four) hours as needed for moderate pain. Patient not taking: Reported on 01/14/2017 12/15/15   Lattie Corns, PA-C  traMADol (ULTRAM) 50 MG tablet Take 1-2 tablets (50-100 mg total) by mouth every 4 (four) hours as needed for moderate pain. Patient not taking: Reported on 01/14/2017 12/15/15   Lattie Corns, PA-C    REVIEW OF SYSTEMS:  Review of Systems  Unable to perform ROS: Acuity of condition     VITAL SIGNS:   Vitals:   01/14/17 2000 01/14/17 2100 01/14/17 2253 01/14/17 2300  BP: 129/80 101/64 (!) 96/52 (!) 86/56  Pulse: (!) 36 (!) 127 (!) 120 (!) 118  Resp: (!) 24 20 (!) 21 (!) 24   Temp:      TempSrc:      SpO2: (!) 86% 100% 99% 99%  Weight:      Height:       Wt Readings from Last 3 Encounters:  01/14/17 63 kg (139 lb)  12/13/15 67.6 kg (149 lb)  11/30/15 67.6 kg (149 lb)    PHYSICAL EXAMINATION:  Physical Exam  Vitals reviewed. Constitutional: She appears well-developed and well-nourished. No distress.  HENT:  Head: Normocephalic.  Mouth/Throat: Oropharynx is clear and moist.  Extensive facial bruising and swelling  Eyes: Pupils are equal, round, and reactive to light. Conjunctivae and EOM are normal. No scleral icterus.  Neck: Normal range of motion. Neck supple.  No JVD present. No thyromegaly present.  Cardiovascular: Regular rhythm and intact distal pulses.  Exam reveals no gallop and no friction rub.   No murmur heard. Tachycardic  Respiratory: Effort normal and breath sounds normal. No respiratory distress. She has no wheezes. She has no rales.  GI: Soft. Bowel sounds are normal. She exhibits no distension. There is no tenderness.  Musculoskeletal: Normal range of motion. She exhibits no edema.  No arthritis, no gout  Lymphadenopathy:    She has no cervical adenopathy.  Neurological: She is alert. No cranial nerve deficit.  Patient is oriented to person and place, but is unable to contribute much information to her history of present illness and is unable to answer many questions beyond direct questions about her current physical status  Skin: Skin is warm and dry. No rash noted. No erythema.  Psychiatric:  Unable to fully assess due to patient condition    LABORATORY PANEL:   CBC  Recent Labs Lab 01/14/17 2008  WBC 1.9*  HGB 10.8*  HCT 31.8*  PLT 148*   ------------------------------------------------------------------------------------------------------------------  Chemistries   Recent Labs Lab 01/14/17 2008  NA 136  K 4.1  CL 98*  CO2 24  GLUCOSE 97  BUN 13  CREATININE 1.07*  CALCIUM 7.6*  AST 137*  ALT 61*   ALKPHOS 126  BILITOT 3.8*   ------------------------------------------------------------------------------------------------------------------  Cardiac Enzymes  Recent Labs Lab 01/14/17 2008  TROPONINI <0.03   ------------------------------------------------------------------------------------------------------------------  RADIOLOGY:  Ct Head Wo Contrast  Result Date: 01/14/2017 CLINICAL DATA:  Fall.  Trauma to front of head. EXAM: CT HEAD WITHOUT CONTRAST TECHNIQUE: Contiguous axial images were obtained from the base of the skull through the vertex without intravenous contrast. COMPARISON:  08/01/2015 FINDINGS: Brain: Expected cerebral volume loss for age. Mild low density in the periventricular white matter likely related to small vessel disease. No mass lesion, hemorrhage, hydrocephalus, acute infarct, intra-axial, or extra-axial fluid collection. Vascular: Intracranial atherosclerosis. Skull: Soft tissue swelling superficial to the superior aspect of with nose and about the frontal sinuses. This extends minimally into the right frontal scalp. No skull fracture. Sinuses/Orbits: Normal imaged portions of the orbits and globes. There is osseous irregularity involving both nasal bones, felt to be relatively similar to 10/16/2012. Partial ethmoid air cell opacification. hyperattenuating material in the left frontal sinus is new. Clear mastoid air cells. Other: None. IMPRESSION: 1. Periorbital and frontal scalp soft tissue thickening, without acute intracranial abnormality. 2. New hyperattenuating material in the left frontal sinus could represent hemorrhage. No acute fracture is seen. If this is a clinical concern, consider dedicated face CT. 3. Osseous irregularity about both nasal bones is felt to be grossly similar back to 10/16/2012. Correlate with clinical exam to exclude acute superimposed injury. 4.  Cerebral atrophy and small vessel ischemic change. Electronically Signed   By: Abigail Miyamoto  M.D.   On: 01/14/2017 13:30   Ct Chest W Contrast  Result Date: 01/14/2017 CLINICAL DATA:  Fall. EXAM: CT CHEST, ABDOMEN, AND PELVIS WITH CONTRAST TECHNIQUE: Multidetector CT imaging of the chest, abdomen and pelvis was performed following the standard protocol during bolus administration of intravenous contrast. CONTRAST:  170mL ISOVUE-300 IOPAMIDOL (ISOVUE-300) INJECTION 61% COMPARISON:  01/17/2011 FINDINGS: CT CHEST FINDINGS Cardiovascular: Aortic atherosclerosis. Calcification in the LAD coronary artery noted. Normal heart size. No pericardial effusion. Mediastinum/Nodes: No enlarged mediastinal, hilar, or axillary lymph nodes. Thyroid gland, trachea, and esophagus demonstrate no significant findings. Lungs/Pleura: No pleural effusion identified. No pulmonary contusion or pneumothorax. 4 mm right  middle lobe pulmonary nodule identified, image 85 of series 4. Left lower lobe nodule measures 3 mm, image 104 of series 4. Musculoskeletal: Spondylosis noted within the thoracic spine. Inferior endplate deformity involving the T12 vertebra is age indeterminate. No displaced rib fractures identified. CT ABDOMEN PELVIS FINDINGS Hepatobiliary: Marked diffuse hepatic steatosis. No focal liver abnormality. The gallbladder appears normal. No biliary dilatation. Pancreas: Unremarkable. No pancreatic ductal dilatation or surrounding inflammatory changes. Spleen: Spleen is unremarkable. Adrenals/Urinary Tract: No adrenal hemorrhage or renal injury identified. Bladder is unremarkable. 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 No pathologic dilatation of the large or small bowel loops. Vascular/Lymphatic: Aortic atherosclerosis. No aneurysm. No upper abdominal adenopathy. No pelvic or inguinal adenopathy. Reproductive: Status post hysterectomy. No adnexal masses. Other: No scratch set trace free fluid within the pelvis. No focal fluid collections. Musculoskeletal: Healed fracture deformity  involving the right inferior pubic rami noted. No acute pelvic fracture noted. Chronic progressive superior endplate deformity involving the L3 vertebra. IMPRESSION: 1. Age-indeterminate T12 inferior endplate compression deformity. Progressive chronic inferior endplate deformity involves the L3 vertebra. 2. No evidence for solid organ or hollow viscus injury. 3. Small pulmonary nodules measure up to 4 mm. Nonspecific. No follow-up needed if patient is low-risk (and has no known or suspected primary neoplasm). Non-contrast chest CT can be considered in 12 months if patient is high-risk. This recommendation follows the consensus statement: Guidelines for Management of Incidental Pulmonary Nodules Detected on CT Images: From the Fleischner Society 2017; Radiology 2017; 284:228-243. 4. Aortic Atherosclerosis (ICD10-I70.0). Lad coronary artery calcification noted. 5. Hepatic steatosis. Electronically Signed   By: Kerby Moors M.D.   On: 01/14/2017 21:54   Ct Abdomen Pelvis W Contrast  Result Date: 01/14/2017 CLINICAL DATA:  Fall. EXAM: CT CHEST, ABDOMEN, AND PELVIS WITH CONTRAST TECHNIQUE: Multidetector CT imaging of the chest, abdomen and pelvis was performed following the standard protocol during bolus administration of intravenous contrast. CONTRAST:  17mL ISOVUE-300 IOPAMIDOL (ISOVUE-300) INJECTION 61% COMPARISON:  01/17/2011 FINDINGS: CT CHEST FINDINGS Cardiovascular: Aortic atherosclerosis. Calcification in the LAD coronary artery noted. Normal heart size. No pericardial effusion. Mediastinum/Nodes: No enlarged mediastinal, hilar, or axillary lymph nodes. Thyroid gland, trachea, and esophagus demonstrate no significant findings. Lungs/Pleura: No pleural effusion identified. No pulmonary contusion or pneumothorax. 4 mm right middle lobe pulmonary nodule identified, image 85 of series 4. Left lower lobe nodule measures 3 mm, image 104 of series 4. Musculoskeletal: Spondylosis noted within the thoracic spine.  Inferior endplate deformity involving the T12 vertebra is age indeterminate. No displaced rib fractures identified. CT ABDOMEN PELVIS FINDINGS Hepatobiliary: Marked diffuse hepatic steatosis. No focal liver abnormality. The gallbladder appears normal. No biliary dilatation. Pancreas: Unremarkable. No pancreatic ductal dilatation or surrounding inflammatory changes. Spleen: Spleen is unremarkable. Adrenals/Urinary Tract: No adrenal hemorrhage or renal injury identified. Bladder is unremarkable. 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 No pathologic dilatation of the large or small bowel loops. Vascular/Lymphatic: Aortic atherosclerosis. No aneurysm. No upper abdominal adenopathy. No pelvic or inguinal adenopathy. Reproductive: Status post hysterectomy. No adnexal masses. Other: No scratch set trace free fluid within the pelvis. No focal fluid collections. Musculoskeletal: Healed fracture deformity involving the right inferior pubic rami noted. No acute pelvic fracture noted. Chronic progressive superior endplate deformity involving the L3 vertebra. IMPRESSION: 1. Age-indeterminate T12 inferior endplate compression deformity. Progressive chronic inferior endplate deformity involves the L3 vertebra. 2. No evidence for solid organ or hollow viscus injury. 3. Small pulmonary nodules measure up to 4 mm. Nonspecific. No follow-up  needed if patient is low-risk (and has no known or suspected primary neoplasm). Non-contrast chest CT can be considered in 12 months if patient is high-risk. This recommendation follows the consensus statement: Guidelines for Management of Incidental Pulmonary Nodules Detected on CT Images: From the Fleischner Society 2017; Radiology 2017; 284:228-243. 4. Aortic Atherosclerosis (ICD10-I70.0). Lad coronary artery calcification noted. 5. Hepatic steatosis. Electronically Signed   By: Kerby Moors M.D.   On: 01/14/2017 21:54   Ct Maxillofacial Wo Contrast  Result  Date: 01/14/2017 CLINICAL DATA:  Fall last night. Bruising and swelling to the face and chest. EXAM: CT MAXILLOFACIAL WITHOUT CONTRAST TECHNIQUE: Multidetector CT imaging of the maxillofacial structures was performed. Multiplanar CT image reconstructions were also generated. COMPARISON:  CT head without contrast from the same day. FINDINGS: Osseous: Bilateral nasal fractures are present. Hemorrhage in the left frontal sinus suggests an occult fracture. No discrete fracture is present. The visualized calvarium is intact. The mandible is intact and located. Advanced degenerative changes are noted within the TMJ, worse on the left. Orbits: Periorbital soft tissue swelling and hematoma is worse right than left. There is no underlying globe injury. Bilateral lens replacements are present. Sinuses: Minimal low-density fluid is present in the sphenoid sinuses bilaterally. Small polyps or mucous retention cysts are noted inferiorly in the maxillary sinuses. Hemorrhages noted in the left frontal sinus. The remaining paranasal sinuses are clear. The mastoid air cells are clear. Soft tissues: Focal high-density hemorrhage is noted over the bridge of the nose in the midline. Bilateral periorbital soft tissue swelling hematoma is present. There is soft tissue edema over the left side of the face. Calcifications in the subcutaneous tissues of face bilaterally may be related to prior trauma or injections. The airway is patent. The visualized soft tissues the neck are within normal limits. Limited intracranial: Within normal limits. IMPRESSION: 1. Bilateral nasal bone fractures without significant displacement. 2. Left frontal hemorrhage suggest an occult fracture. No discrete fracture is visualized. 3. Extensive bilateral periorbital soft swelling and hematoma with inflammatory changes extending over the maxilla bilaterally, left greater than right. Electronically Signed   By: San Morelle M.D.   On: 01/14/2017 15:28     EKG:   Orders placed or performed during the hospital encounter of 01/14/17  . ED EKG  . ED EKG    IMPRESSION AND PLAN:  Principal Problem:   Sepsis ([place]) - Unclear source at this time, IV antibiotics started, lactic acid was elevated so IV fluids are in place and we will trend her lactic acid serially until within normal limits, blood pressure was low, fluid resuscitation still in process, patient may need central line and pressors and this was discussed with patient and her husband. Cultures sent from the ED. Admit patient to ICU Active Problems:   Facial trauma - from her fall, no overt bony fractures noted on CT scans, though some microfractures suspected with some hemorrhage into her facial sinus.   Neutropenia ([place]) - most likely as a result of her sepsis, neutropenic precautions, other workup as above   Osteoporosis - predisposes her to above suspected fractures   Alcohol abuse - CIWA protocol  All the records are reviewed and case discussed with ED provider. Management plans discussed with the patient and/or family.  DVT PROPHYLAXIS: SubQ lovenox  GI PROPHYLAXIS: None  ADMISSION STATUS: Inpatient  CODE STATUS: Full Code Status History    Date Active Date Inactive Code Status Order ID Comments User Context   12/13/2015 11:49 AM 12/15/2015  7:28 PM Full Code [id number]  Dereck Leep, MD Inpatient   09/17/2014  6:01 AM 09/18/2014  3:51 PM Full Code [id number]  Juluis Mire, MD Inpatient      TOTAL CRITICAL CARE TIME TAKING CARE OF THIS PATIENT: 50 minutes.   Jannifer Franklin, Selah Zelman [place] 01/14/2017, 11:35 PM  [facility]  Office  [phone]  CC: Primary care physician; Cletis Athens, MD  Note:  This document was prepared using Dragon voice recognition software and may include unintentional dictation errors.

## 2017-01-14 NOTE — Progress Notes (Signed)
Both arms assessed for  PIV for CT  Scan.  No suitable veins found.

## 2017-01-14 NOTE — ED Notes (Signed)
Lab called with a critical high Lactic acid of 6.7 Dr. Owens Shark was notified.

## 2017-01-14 NOTE — ED Triage Notes (Signed)
Pt fell last night, husband reports pt was unconscious, pt saw MD today had a CT, pt has dried blood in hair, bruising and swelling to face and chest

## 2017-01-14 NOTE — ED Notes (Signed)
Pt poorly tolerated IV stick. Very anxious . Preferred IV specialist

## 2017-01-14 NOTE — ED Provider Notes (Signed)
[facility] Emergency Department Provider Note  ____________________________________________   First MD Initiated Contact with Patient 01/14/17 1716     (approximate)  I have reviewed the triage vital signs and the nursing notes.   HISTORY  Chief Complaint Fall   HPI Sonya Park is a 76 y.o. female with a history of bilateral knee replacements on an every other day aspirin who is presenting to the emergency department today after fall last night at about 11:30 PM. The patient does not remember the circumstances of the fall. She says that she and her husband are now staying in a mobile home outside of their house because of mold in the house. The patient went into home to use the bathroom last night at about 11:30 PM and then fell down about 3 stairs face forward. The husband reported seeing a puddle of blood under the patis a sepsis #5ient and that she could've been unresponsive/unconscious for about an hour and half before he found her. The patient does not remember any preceding symptoms or the circumstances of how she fell. She reported to her primary care doctor earlier today who did a CAT scan without any significant intracranial injury but a possible frontal hemorrhage and occult skull fracture. Patient says that she has had her tetanus shot within the last 10 years. Also with pain in the left lower extremity where she suffered an abrasion.   Past Medical History:  Diagnosis Date  . Arthritis   . Back pain   . Insomnia   . Medical history non-contributory   . Osteoporosis     Patient Active Problem List   Diagnosis Date Noted  . 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 Special screening for malignant neoplasms, colon   . Benign neoplasm of transverse colon   . Fracture of right inferior pubic ramus ([place]) 09/17/2014  . Compression fracture of L3 lumbar vertebra ([place]) 09/17/2014    Past Surgical History:  Procedure Laterality Date  .  ABDOMINAL HYSTERECTOMY    . BREAST LUMPECTOMY Right   . COLONOSCOPY WITH PROPOFOL N/A 07/18/2015   Procedure: COLONOSCOPY WITH PROPOFOL;  Surgeon: [name], MD;  Location: [facility] ENDOSCOPY;  Service: Endoscopy;  Laterality: N/A;  . COSMETIC SURGERY    . GASTRIC BYPASS     X2  . HEMORROIDECTOMY    . KNEE ARTHROPLASTY Right 12/13/2015   Procedure: COMPUTER ASSISTED TOTAL KNEE ARTHROPLASTY;  Surgeon: Dereck Leep, MD;  Location: [facility] ORS;  Service: Orthopedics;  Laterality: Right;  . KNEE ARTHROSCOPY    . REPLACEMENT TOTAL KNEE Left     Prior to Admission medications   Medication Sig Start Date End Date Taking? Authorizing Provider  aspirin 81 MG tablet Take 81 mg by mouth daily.   Yes [provider]  CALCIUM CARBONATE PO Take 1 tablet by mouth daily.    Yes [provider]  cholecalciferol (VITAMIN D) 1000 UNITS tablet Take 2,000 Units by mouth daily.    Yes [provider]  estrogens, conjugated, (PREMARIN) 0.625 MG tablet Take 0.625 mg by mouth daily. Take daily for 21 days then do not take for 7 days.   Yes [provider]  ibandronate (BONIVA) 150 MG tablet Take 150 mg by mouth every 30 (thirty) days. Take in the morning with a full glass of water, on an empty stomach, and do not take anything else by mouth or lie down for the next 30 min.   Yes [provider]  megestrol (  MEGACE) 20 MG tablet Take 1 tablet by mouth daily. 12/30/16 01/29/17 Yes [provider]  Probiotic Product (DIGESTIVE ADVANTAGE GUMMIES) CHEW Chew 3 Doses by mouth daily.    Yes [provider]  clindamycin (CLEOCIN) 150 MG capsule Take 600 mg by mouth daily as needed. Dental work    Secondary school teacher, Historical, MD  DiphenhydrAMINE HCl (ZZZQUIL) 50 MG/30ML LIQD Take 30 mLs by mouth at bedtime as needed (for sleep).    [provider]  oxyCODONE (OXY IR/ROXICODONE) 5 MG immediate release tablet Take 1-2 tablets (5-10 mg total) by mouth every 4 (four) hours  as needed for moderate pain. Patient not taking: Reported on 01/14/2017 12/15/15   Lattie Corns, PA-C  traMADol (ULTRAM) 50 MG tablet Take 1-2 tablets (50-100 mg total) by mouth every 4 (four) hours as needed for moderate pain. Patient not taking: Reported on 01/14/2017 12/15/15   Lattie Corns, PA-C    Allergies Other and Penicillins  Family History  Problem Relation Age of Onset  . Liver disease Mother   . Heart attack Father   . Cancer Father   . CAD Sister     Social History Social History  Substance Use Topics  . Smoking status: Former Smoker    Quit date: 11/30/1994  . Smokeless tobacco: Never Used  . Alcohol use 12.6 oz/week    21 Glasses of wine per week     Comment: 3 glasses of wine/ day, none in 2 days    Review of Systems  Constitutional: No fever/chills Eyes: No visual changes. ENT: facial bruising Cardiovascular: Denies chest pain. Respiratory: Denies shortness of breath. Gastrointestinal: No abdominal pain.  No nausea, no vomiting.  No diarrhea.  No constipation. Genitourinary: Negative for dysuria. Musculoskeletal: Negative for back pain. Skin: Negative for rash. Neurological: Negative for headaches, focal weakness or numbness.   ____________________________________________   PHYSICAL EXAM:  VITAL SIGNS: ED Triage Vitals  Enc Vitals Group     BP 01/14/17 1456 (!) 162/89     Pulse Rate 01/14/17 1456 (!) 109     Resp 01/14/17 1456 18     Temp 01/14/17 1456 98.3 F (36.8 C)     Temp Source 01/14/17 1456 Oral     SpO2 01/14/17 1456 98 %     Weight 01/14/17 1456 139 lb (63 kg)     Height 01/14/17 1456 5\' 6"  (1.676 m)     Head Circumference --      Peak Flow --      Pain Score 01/14/17 1454 9     Pain Loc --      Pain Edu? --      Excl. in [place]? --     Constitutional: Alert and oriented. Well appearing and in no acute distress.Patient is shaking and already has 3 blankets but says she is still very cold. Husband says that she sometimes  shakes vigorously when she does get cold. Eyes: Conjunctivae are normal.  Head:ecchymosis to the left side of the forehead as well as to the nasal bridge and under the bilateral eyes. Tenderness palpation over the nasal bridge without deformity. No nasal septal hematoma. Nose: No congestion/rhinnorhea. Mouth/Throat: Mucous membranes are moist. no obvious tooth fractures nor other loose teeth. Neck: No stridor.  no tenderness to palpation to the midline cervical spine. Patient ranges her head and neck freely. Cardiovascular: Normal rate, regular rhythm. Grossly normal heart sounds.   Respiratory: Normal respiratory effort.  No retractions. Lungs CTAB. Gastrointestinal: Soft and nontender. No  distention. left-sided CVA tenderness palpation with there is a large area of ecchymosis. Musculoskeletal:  moderate bilateral lower extremity edema. Weeping serous fluid to the left lateral extremity where there is about a 1 cm circular area of superficial abraded tissue. Erythema extending from the left lateral middle calf up just past the knee with the patient says that she scraped herself during the fall.  Ecchymosis overlying the left breast, about half the breast medially.   Neurologic:  Normal speech and language. No gross focal neurologic deficits are appreciated. Skin:  as above Psychiatric: Mood and affect are normal. Speech and behavior are normal.  ____________________________________________   LABS (all labs ordered are listed, but only abnormal results are displayed)  Labs Reviewed  CBC WITH DIFFERENTIAL/PLATELET - Abnormal; Notable for the following:       Result Value   WBC 1.9 (*)    RBC 2.97 (*)    Hemoglobin 10.8 (*)    HCT 31.8 (*)    MCV 107.0 (*)    MCH 36.5 (*)    RDW 16.3 (*)    Platelets 148 (*)    Neutro Abs 1.3 (*)    Lymphs Abs 0.5 (*)    Monocytes Absolute 0.1 (*)    All other components within normal limits  COMPREHENSIVE METABOLIC PANEL - Abnormal; Notable for  the following:    Chloride 98 (*)    Creatinine, Ser 1.07 (*)    Calcium 7.6 (*)    Total Protein 4.9 (*)    Albumin 2.5 (*)    AST 137 (*)    ALT 61 (*)    Total Bilirubin 3.8 (*)    GFR calc non Af Amer 49 (*)    GFR calc Af Amer 57 (*)    All other components within normal limits  CK - Abnormal; Notable for the following:    Total CK 569 (*)    All other components within normal limits  PROTIME-INR - Abnormal; Notable for the following:    Prothrombin Time 16.0 (*)    All other components within normal limits  CULTURE, BLOOD (ROUTINE X 2)  CULTURE, BLOOD (ROUTINE X 2)  URINE CULTURE  TROPONIN I  URINALYSIS, COMPLETE (UACMP) WITH MICROSCOPIC  LACTIC ACID, PLASMA  LACTIC ACID, PLASMA   ____________________________________________   EKG  ED ECG REPORT I, Doran Stabler, the attending physician, personally viewed and interpreted this ECG.   Date: 01/14/2017  EKG Time: 1826  Rate: 100  Rhythm: normal sinus rhythm  Axis: unclear, poor baseline secondary to tremor  Intervals:unclear, baseline very distorted secondary to tremor.  ST&T Change: unclear, baseline very distorted secondary to tremor  EKG quality confounded by the patient's tremor.  ED ECG REPORT I, Doran Stabler, the attending physician, personally viewed and interpreted this ECG.   Date: 01/14/2017  EKG Time: 1456  Rate: 111  Rhythm: sinus tachycardia  Axis: left axis  Intervals:none  ST&T Change: no ST segment elevation or depression. No abnormal T-wave inversion. ____________________________________________  RADIOLOGY  IMPRESSION: 1. Bilateral nasal bone fractures without significant displacement. 2. Left frontal hemorrhage suggest an occult fracture. No discrete fracture is visualized. 3. Extensive bilateral periorbital soft swelling and hematoma with inflammatory changes extending over the maxilla bilaterally, left greater than right.   Electronically Signed By: San Morelle M.D. On: 01/14/2017 15:28  no acute cranial injury on CT had from about 1:30 this afternoon.  age-indeterminate compression fracture of T12. ____________________________________________   PROCEDURES  Procedure(s) performed:  Angiocath insertion Performed by: Doran Stabler  Consent: Verbal consent obtained. Risks and benefits: risks, benefits and alternatives were discussed Time out: Immediately prior to procedure a "time out" was called to verify the correct patient, procedure, equipment, support staff and site/side marked as required.  Preparation: Patient was prepped and draped in the usual sterile fashion.  Vein Location: left basilic  Ultrasound Guided  Gauge: 18  Normal blood return and flush without difficulty Patient tolerance: Patient tolerated the procedure well with no immediate complications.     Procedures  Critical Care performed:   ____________________________________________   INITIAL IMPRESSION / ASSESSMENT AND PLAN / ED COURSE  Pertinent labs & imaging results that were available during my care of the patient were reviewed by me and considered in my medical decision making (see chart for details).  DDX: Nasal fracture, skull fracture, cellulitis to the left lower extremity, pneumothorax, rib fractures, traumatic blunt trauma kidney injury    ----------------------------------------- 11:09 PM on 01/14/2017 -----------------------------------------  Patient with lab derangements including leukopenia as well as hyperbilirubinemia. Patient tachycardic with blood pressures in the 90s now. Also with mild rhabdo my lysis. Husband says that the patient could've been down for about an hour and a half. Sepsis were called. Patient will be admitted to the hospital. Signed out to Dr. Jannifer Franklin. Family as well as the patient understanding the plan and willing to comply.possible source of the patient's left lower  extremity.  ____________________________________________   FINAL CLINICAL IMPRESSION(S) / ED DIAGNOSES  sepsis. Cellulitis. Fall. Nasal bone fracture. Hyperbilirubinemia. Leukopenia.    NEW MEDICATIONS STARTED DURING THIS VISIT:  New Prescriptions   No medications on file     Note:  This document was prepared using Dragon voice recognition software and may include unintentional dictation errors.     Orbie Pyo, MD 01/14/17 [phone]

## 2017-01-15 ENCOUNTER — Inpatient Hospital Stay: Payer: Medicare Other

## 2017-01-15 ENCOUNTER — Encounter: Payer: Self-pay | Admitting: Internal Medicine

## 2017-01-15 DIAGNOSIS — T884XXA Failed or difficult intubation, initial encounter: Secondary | ICD-10-CM

## 2017-01-15 DIAGNOSIS — A419 Sepsis, unspecified organism: Secondary | ICD-10-CM

## 2017-01-15 DIAGNOSIS — F1010 Alcohol abuse, uncomplicated: Secondary | ICD-10-CM | POA: Diagnosis present

## 2017-01-15 DIAGNOSIS — J9600 Acute respiratory failure, unspecified whether with hypoxia or hypercapnia: Secondary | ICD-10-CM

## 2017-01-15 DIAGNOSIS — R6521 Severe sepsis with septic shock: Secondary | ICD-10-CM

## 2017-01-15 LAB — BASIC METABOLIC PANEL
ANION GAP: 17 — AB (ref 5–15)
Anion gap: 17 — ABNORMAL HIGH (ref 5–15)
Anion gap: 17 — ABNORMAL HIGH (ref 5–15)
Anion gap: 16 — ABNORMAL HIGH (ref 5–15)
BUN: 12 mg/dL (ref 6–20)
BUN: 12 mg/dL (ref 6–20)
BUN: 12 mg/dL (ref 6–20)
BUN: 12 mg/dL (ref 6–20)
CALCIUM: 6 mg/dL — AB (ref 8.9–10.3)
CALCIUM: 6.3 mg/dL — AB (ref 8.9–10.3)
CALCIUM: 5.9 mg/dL — AB (ref 8.9–10.3)
CHLORIDE: 107 mmol/L (ref 101–111)
CO2: 13 mmol/L — ABNORMAL LOW (ref 22–32)
CO2: 11 mmol/L — AB (ref 22–32)
CO2: 13 mmol/L — AB (ref 22–32)
CO2: 12 mmol/L — ABNORMAL LOW (ref 22–32)
CREATININE: 1.22 mg/dL — AB (ref 0.44–1.00)
CREATININE: 1.17 mg/dL — AB (ref 0.44–1.00)
CREATININE: 1.19 mg/dL — AB (ref 0.44–1.00)
Calcium: 6 mg/dL — CL (ref 8.9–10.3)
Chloride: 107 mmol/L (ref 101–111)
Chloride: 105 mmol/L (ref 101–111)
Chloride: 108 mmol/L (ref 101–111)
Creatinine, Ser: 1.18 mg/dL — ABNORMAL HIGH (ref 0.44–1.00)
GFR calc Af Amer: 49 mL/min — ABNORMAL LOW (ref >60)
GFR calc Af Amer: 51 mL/min — ABNORMAL LOW (ref >60)
GFR calc non Af Amer: 42 mL/min — ABNORMAL LOW (ref >60)
GFR calc non Af Amer: 44 mL/min — ABNORMAL LOW (ref >60)
GFR, EST AFRICAN AMERICAN: 51 mL/min — AB (ref >60)
GFR, EST AFRICAN AMERICAN: 50 mL/min — AB (ref >60)
GFR, EST NON AFRICAN AMERICAN: 44 mL/min — AB (ref >60)
GFR, EST NON AFRICAN AMERICAN: 44 mL/min — AB (ref >60)
GLUCOSE: 122 mg/dL — AB (ref 65–99)
Glucose, Bld: 79 mg/dL (ref 65–99)
Glucose, Bld: 87 mg/dL (ref 65–99)
Glucose, Bld: 120 mg/dL — ABNORMAL HIGH (ref 65–99)
POTASSIUM: 3.9 mmol/L (ref 3.5–5.1)
Potassium: 2.9 mmol/L — ABNORMAL LOW (ref 3.5–5.1)
Potassium: 3.9 mmol/L (ref 3.5–5.1)
Potassium: 3.9 mmol/L (ref 3.5–5.1)
SODIUM: 137 mmol/L (ref 135–145)
SODIUM: 136 mmol/L (ref 135–145)
Sodium: 135 mmol/L (ref 135–145)
Sodium: 135 mmol/L (ref 135–145)

## 2017-01-15 LAB — BLOOD CULTURE ID PANEL (REFLEXED)
ACINETOBACTER BAUMANNII: NOT DETECTED
CANDIDA KRUSEI: NOT DETECTED
CANDIDA TROPICALIS: NOT DETECTED
CARBAPENEM RESISTANCE: NOT DETECTED
Candida albicans: NOT DETECTED
Candida glabrata: NOT DETECTED
Candida parapsilosis: NOT DETECTED
Enterobacter cloacae complex: NOT DETECTED
Enterobacteriaceae species: DETECTED — AB
Enterococcus species: NOT DETECTED
Escherichia coli: DETECTED — AB
HAEMOPHILUS INFLUENZAE: NOT DETECTED
Klebsiella oxytoca: NOT DETECTED
Klebsiella pneumoniae: NOT DETECTED
Listeria monocytogenes: NOT DETECTED
NEISSERIA MENINGITIDIS: NOT DETECTED
Proteus species: NOT DETECTED
Pseudomonas aeruginosa: NOT DETECTED
SERRATIA MARCESCENS: NOT DETECTED
STAPHYLOCOCCUS SPECIES: NOT DETECTED
STREPTOCOCCUS PNEUMONIAE: NOT DETECTED
Staphylococcus aureus (BCID): NOT DETECTED
Streptococcus agalactiae: NOT DETECTED
Streptococcus pyogenes: NOT DETECTED
Streptococcus species: NOT DETECTED

## 2017-01-15 LAB — URINALYSIS, COMPLETE (UACMP) WITH MICROSCOPIC
Bacteria, UA: NONE SEEN
Bilirubin Urine: NEGATIVE
Glucose, UA: NEGATIVE mg/dL
HGB URINE DIPSTICK: NEGATIVE
Ketones, ur: NEGATIVE mg/dL
LEUKOCYTES UA: NEGATIVE
Nitrite: NEGATIVE
PH: 5 (ref 5.0–8.0)
Protein, ur: NEGATIVE mg/dL

## 2017-01-15 LAB — CBC
HCT: 25.5 % — ABNORMAL LOW (ref 35.0–47.0)
Hemoglobin: 8.5 g/dL — ABNORMAL LOW (ref 12.0–16.0)
MCH: 36 pg — ABNORMAL HIGH (ref 26.0–34.0)
MCHC: 33.2 g/dL (ref 32.0–36.0)
MCV: 108.3 fL — AB (ref 80.0–100.0)
PLATELETS: 84 10*3/uL — AB (ref 150–440)
RBC: 2.36 MIL/uL — ABNORMAL LOW (ref 3.80–5.20)
RDW: 16.6 % — AB (ref 11.5–14.5)
WBC: 0.8 10*3/uL — AB (ref 3.6–11.0)

## 2017-01-15 LAB — BLOOD GAS, ARTERIAL
Acid-base deficit: 13.2 mmol/L — ABNORMAL HIGH (ref 0.0–2.0)
Bicarbonate: 12.2 mmol/L — ABNORMAL LOW (ref 20.0–28.0)
FIO2: 0.4
Mechanical Rate: 20
O2 Saturation: 98.5 %
PEEP: 5 cmH2O
PH ART: 7.28 — AB (ref 7.350–7.450)
Patient temperature: 37
VT: 450 mL
pCO2 arterial: 26 mmHg — ABNORMAL LOW (ref 32.0–48.0)
pO2, Arterial: 129 mmHg — ABNORMAL HIGH (ref 83.0–108.0)

## 2017-01-15 LAB — HEMOGLOBIN AND HEMATOCRIT, BLOOD
HEMATOCRIT: 28.3 % — AB (ref 35.0–47.0)
HEMOGLOBIN: 9.4 g/dL — AB (ref 12.0–16.0)

## 2017-01-15 LAB — GLUCOSE, CAPILLARY
GLUCOSE-CAPILLARY: 77 mg/dL (ref 65–99)
GLUCOSE-CAPILLARY: 152 mg/dL — AB (ref 65–99)
GLUCOSE-CAPILLARY: 53 mg/dL — AB (ref 65–99)
GLUCOSE-CAPILLARY: 88 mg/dL (ref 65–99)
GLUCOSE-CAPILLARY: 68 mg/dL (ref 65–99)
GLUCOSE-CAPILLARY: 113 mg/dL — AB (ref 65–99)
Glucose-Capillary: 57 mg/dL — ABNORMAL LOW (ref 65–99)
Glucose-Capillary: 91 mg/dL (ref 65–99)

## 2017-01-15 LAB — MAGNESIUM
MAGNESIUM: 1.4 mg/dL — AB (ref 1.7–2.4)
Magnesium: 2.3 mg/dL (ref 1.7–2.4)

## 2017-01-15 LAB — PROCALCITONIN: PROCALCITONIN: 4.64 ng/mL

## 2017-01-15 LAB — ALBUMIN: ALBUMIN: 1.5 g/dL — AB (ref 3.5–5.0)

## 2017-01-15 LAB — LACTIC ACID, PLASMA: LACTIC ACID, VENOUS: 13 mmol/L — AB (ref 0.5–1.9)

## 2017-01-15 LAB — PHOSPHORUS: PHOSPHORUS: 2.9 mg/dL (ref 2.5–4.6)

## 2017-01-15 MED ORDER — METOPROLOL TARTRATE 5 MG/5ML IV SOLN
2.5000 mg | INTRAVENOUS | Status: DC | PRN
  Administered 2017-01-15: 5 mg via INTRAVENOUS

## 2017-01-15 MED ORDER — DEXMEDETOMIDINE HCL IN NACL 400 MCG/100ML IV SOLN
0.4000 ug/kg/h | INTRAVENOUS | Status: DC
  Administered 2017-01-15: 0.5 ug/kg/h via INTRAVENOUS
  Filled 2017-01-15: qty 100

## 2017-01-15 MED ORDER — FENTANYL CITRATE (PF) 100 MCG/2ML IJ SOLN
25.0000 ug | Freq: Once | INTRAMUSCULAR | Status: AC
  Administered 2017-01-15: 25 ug via INTRAVENOUS

## 2017-01-15 MED ORDER — EPINEPHRINE PF 1 MG/ML IJ SOLN
INTRAMUSCULAR | Status: AC
  Administered 2017-01-15: 19:00:00
  Filled 2017-01-15: qty 2

## 2017-01-15 MED ORDER — POTASSIUM CHLORIDE 10 MEQ/50ML IV SOLN
10.0000 meq | INTRAVENOUS | Status: AC
  Administered 2017-01-15 (×6): 10 meq via INTRAVENOUS
  Filled 2017-01-15 (×6): qty 50

## 2017-01-15 MED ORDER — SODIUM BICARBONATE 8.4 % IV SOLN
50.0000 meq | Freq: Once | INTRAVENOUS | Status: AC
Start: 2017-01-15 — End: 2017-01-15
  Administered 2017-01-15: 50 meq via INTRAVENOUS

## 2017-01-15 MED ORDER — EPINEPHRINE NICU 0.1 MG/ML INJECTION
0.1000 mL/kg | Freq: Once | INTRAMUSCULAR | Status: DC
  Filled 2017-01-15: qty 7.1

## 2017-01-15 MED ORDER — LORAZEPAM 2 MG/ML IJ SOLN: 0.0000 mg | Freq: Two times a day (BID) | INTRAMUSCULAR | Status: DC

## 2017-01-15 MED ORDER — SODIUM CHLORIDE 0.9 % IV BOLUS (SEPSIS)
1000.0000 mL | Freq: Once | INTRAVENOUS | Status: AC
  Administered 2017-01-15: 1000 mL via INTRAVENOUS

## 2017-01-15 MED ORDER — PANTOPRAZOLE SODIUM 40 MG IV SOLR
40.0000 mg | INTRAVENOUS | Status: DC
  Administered 2017-01-15 – 2017-01-20 (×6): 40 mg via INTRAVENOUS
  Filled 2017-01-15 (×6): qty 40

## 2017-01-15 MED ORDER — CHLORHEXIDINE GLUCONATE 0.12 % MT SOLN
15.0000 mL | Freq: Two times a day (BID) | OROMUCOSAL | Status: DC
  Administered 2017-01-15 – 2017-02-11 (×53): 15 mL via OROMUCOSAL
  Filled 2017-01-15 (×6): qty 15

## 2017-01-15 MED ORDER — ETOMIDATE 2 MG/ML IV SOLN
20.0000 mg | Freq: Once | INTRAVENOUS | Status: AC
  Administered 2017-01-15: 20 mg via INTRAVENOUS

## 2017-01-15 MED ORDER — SODIUM CHLORIDE 0.9 % IV SOLN
INTRAVENOUS | Status: DC
  Administered 2017-01-15: 04:00:00 via INTRAVENOUS

## 2017-01-15 MED ORDER — DEXTROSE 50 % IV SOLN
1.0000 | Freq: Once | INTRAVENOUS | Status: AC
  Administered 2017-01-15: 50 mL via INTRAVENOUS
  Filled 2017-01-15: qty 50

## 2017-01-15 MED ORDER — SODIUM CHLORIDE 0.9 % IV SOLN
0.0000 ug/min | INTRAVENOUS | Status: DC
  Administered 2017-01-15: 40 ug/min via INTRAVENOUS
  Administered 2017-01-16 (×2): 75 ug/min via INTRAVENOUS
  Administered 2017-01-17: 5 ug/min via INTRAVENOUS
  Administered 2017-01-17: 75 ug/min via INTRAVENOUS
  Filled 2017-01-15 (×4): qty 4
  Filled 2017-01-15: qty 40

## 2017-01-15 MED ORDER — FLUCONAZOLE IN SODIUM CHLORIDE 400-0.9 MG/200ML-% IV SOLN
400.0000 mg | Freq: Once | INTRAVENOUS | Status: AC
  Administered 2017-01-15: 400 mg via INTRAVENOUS
  Filled 2017-01-15: qty 200

## 2017-01-15 MED ORDER — LORAZEPAM 1 MG PO TABS: 1.0000 mg | ORAL_TABLET | Freq: Four times a day (QID) | ORAL | Status: DC | PRN

## 2017-01-15 MED ORDER — ASPIRIN EC 81 MG PO TBEC: 81.0000 mg | DELAYED_RELEASE_TABLET | Freq: Every day | ORAL | Status: DC

## 2017-01-15 MED ORDER — FENTANYL 2500MCG IN NS 250ML (10MCG/ML) PREMIX INFUSION
0.0000 ug/h | INTRAVENOUS | Status: DC
  Administered 2017-01-15: 50 ug/h via INTRAVENOUS
  Administered 2017-01-16 – 2017-01-18 (×3): 125 ug/h via INTRAVENOUS
  Administered 2017-01-19: 50 ug/h via INTRAVENOUS
  Filled 2017-01-15 (×5): qty 250

## 2017-01-15 MED ORDER — NOREPINEPHRINE BITARTRATE 1 MG/ML IV SOLN
0.0000 ug/min | INTRAVENOUS | Status: DC
  Administered 2017-01-15: 20 ug/min via INTRAVENOUS
  Filled 2017-01-15 (×2): qty 16

## 2017-01-15 MED ORDER — MAGNESIUM SULFATE 4 GM/100ML IV SOLN
4.0000 g | Freq: Once | INTRAVENOUS | Status: AC
  Administered 2017-01-15: 4 g via INTRAVENOUS
  Filled 2017-01-15: qty 100

## 2017-01-15 MED ORDER — ORAL CARE MOUTH RINSE
15.0000 mL | OROMUCOSAL | Status: DC
  Administered 2017-01-15 – 2017-01-23 (×76): 15 mL via OROMUCOSAL

## 2017-01-15 MED ORDER — VASOPRESSIN 20 UNIT/ML IV SOLN
0.0300 [IU]/min | INTRAVENOUS | Status: DC
  Administered 2017-01-15 – 2017-01-19 (×5): 0.03 [IU]/min via INTRAVENOUS
  Filled 2017-01-15 (×6): qty 2

## 2017-01-15 MED ORDER — ACETAMINOPHEN 650 MG RE SUPP: 650.0000 mg | Freq: Four times a day (QID) | RECTAL | Status: DC | PRN

## 2017-01-15 MED ORDER — LEVOFLOXACIN IN D5W 750 MG/150ML IV SOLN: 750.0000 mg | INTRAVENOUS | Status: DC

## 2017-01-15 MED ORDER — FENTANYL CITRATE (PF) 100 MCG/2ML IJ SOLN
INTRAMUSCULAR | Status: AC
  Administered 2017-01-15: 25 ug via INTRAVENOUS
  Filled 2017-01-15: qty 2

## 2017-01-15 MED ORDER — VASOPRESSIN 20 UNIT/ML IV SOLN
0.0300 [IU]/min | INTRAVENOUS | Status: DC
  Administered 2017-01-15: 0.03 [IU]/min via INTRAVENOUS
  Filled 2017-01-15: qty 2

## 2017-01-15 MED ORDER — DEXTROSE 50 % IV SOLN
1.0000 | Freq: Once | INTRAVENOUS | Status: DC
  Administered 2017-01-15: 16:00:00 via INTRAVENOUS

## 2017-01-15 MED ORDER — HEPARIN SODIUM (PORCINE) 5000 UNIT/ML IJ SOLN
5000.0000 [IU] | Freq: Three times a day (TID) | INTRAMUSCULAR | Status: DC
  Administered 2017-01-15: 5000 [IU] via SUBCUTANEOUS
  Filled 2017-01-15: qty 1

## 2017-01-15 MED ORDER — DEXTROSE 50 % IV SOLN
INTRAVENOUS | Status: AC
  Filled 2017-01-15: qty 50

## 2017-01-15 MED ORDER — SODIUM CHLORIDE 0.9 % IV SOLN
Freq: Every day | INTRAVENOUS | Status: AC
  Administered 2017-01-15 – 2017-01-16 (×3): via INTRAVENOUS
  Filled 2017-01-15 (×3): qty 500

## 2017-01-15 MED ORDER — HYDRALAZINE HCL 20 MG/ML IJ SOLN
INTRAMUSCULAR | Status: AC
  Filled 2017-01-15: qty 1

## 2017-01-15 MED ORDER — SODIUM CHLORIDE 0.9 % IV BOLUS (SEPSIS)
1000.0000 mL | Freq: Once | INTRAVENOUS | Status: AC
Start: 2017-01-15 — End: 2017-01-15
  Administered 2017-01-15: 1000 mL via INTRAVENOUS

## 2017-01-15 MED ORDER — STERILE WATER FOR INJECTION IV SOLN
INTRAVENOUS | Status: DC
  Administered 2017-01-15 – 2017-01-16 (×3): via INTRAVENOUS
  Filled 2017-01-15 (×6): qty 850

## 2017-01-15 MED ORDER — INFLUENZA VAC SPLIT HIGH-DOSE 0.5 ML IM SUSY
0.5000 mL | PREFILLED_SYRINGE | INTRAMUSCULAR | Status: AC
  Administered 2017-01-16: 0.5 mL via INTRAMUSCULAR
  Filled 2017-01-15 (×2): qty 0.5

## 2017-01-15 MED ORDER — ADULT MULTIVITAMIN W/MINERALS CH: 1.0000 | ORAL_TABLET | Freq: Every day | ORAL | Status: DC

## 2017-01-15 MED ORDER — SODIUM BICARBONATE 8.4 % IV SOLN
150.0000 meq | Freq: Once | INTRAVENOUS | Status: AC
  Administered 2017-01-15: 150 meq via INTRAVENOUS
  Filled 2017-01-15: qty 200

## 2017-01-15 MED ORDER — VANCOMYCIN HCL 10 G IV SOLR
1250.0000 mg | INTRAVENOUS | Status: DC
  Administered 2017-01-15: 1250 mg via INTRAVENOUS
  Filled 2017-01-15: qty 1250

## 2017-01-15 MED ORDER — ACETAMINOPHEN 325 MG PO TABS: 650.0000 mg | ORAL_TABLET | Freq: Four times a day (QID) | ORAL | Status: DC | PRN

## 2017-01-15 MED ORDER — LORAZEPAM 2 MG/ML IJ SOLN: 1.0000 mg | Freq: Four times a day (QID) | INTRAMUSCULAR | Status: DC | PRN

## 2017-01-15 MED ORDER — STERILE WATER FOR INJECTION IV SOLN
INTRAVENOUS | Status: DC
  Administered 2017-01-15: 10:00:00 via INTRAVENOUS
  Filled 2017-01-15 (×3): qty 850

## 2017-01-15 MED ORDER — METOPROLOL TARTRATE 5 MG/5ML IV SOLN
INTRAVENOUS | Status: AC
  Filled 2017-01-15: qty 5

## 2017-01-15 MED ORDER — FENTANYL CITRATE (PF) 100 MCG/2ML IJ SOLN
50.0000 ug | Freq: Once | INTRAMUSCULAR | Status: AC
  Administered 2017-01-15: 50 ug via INTRAVENOUS

## 2017-01-15 MED ORDER — ENOXAPARIN SODIUM 40 MG/0.4ML ~~LOC~~ SOLN: 40.0000 mg | SUBCUTANEOUS | Status: DC

## 2017-01-15 MED ORDER — THIAMINE HCL 100 MG/ML IJ SOLN: 100.0000 mg | Freq: Every day | INTRAMUSCULAR | Status: DC

## 2017-01-15 MED ORDER — MIDAZOLAM HCL 2 MG/2ML IJ SOLN
2.0000 mg | INTRAMUSCULAR | Status: DC | PRN
  Administered 2017-01-19 – 2017-01-20 (×3): 2 mg via INTRAVENOUS
  Filled 2017-01-15 (×4): qty 2

## 2017-01-15 MED ORDER — LORAZEPAM 2 MG/ML IJ SOLN
0.0000 mg | Freq: Four times a day (QID) | INTRAMUSCULAR | Status: DC
  Administered 2017-01-15 (×3): 2 mg via INTRAVENOUS
  Filled 2017-01-15 (×3): qty 1

## 2017-01-15 MED ORDER — FOLIC ACID 1 MG PO TABS: 1.0000 mg | ORAL_TABLET | Freq: Every day | ORAL | Status: DC

## 2017-01-15 MED ORDER — SODIUM CHLORIDE 0.9 % IV SOLN
1.0000 g | Freq: Once | INTRAVENOUS | Status: AC
  Administered 2017-01-15: 1 g via INTRAVENOUS
  Filled 2017-01-15: qty 10

## 2017-01-15 MED ORDER — PHENYLEPHRINE HCL 10 MG/ML IJ SOLN
0.0000 ug/min | INTRAMUSCULAR | Status: DC
  Administered 2017-01-15: 300 ug/min via INTRAVENOUS
  Administered 2017-01-15: 30 ug/min via INTRAVENOUS
  Filled 2017-01-15 (×2): qty 1

## 2017-01-15 MED ORDER — OXYCODONE HCL 5 MG PO TABS: 5.0000 mg | ORAL_TABLET | ORAL | Status: DC | PRN

## 2017-01-15 MED ORDER — ROCURONIUM BROMIDE 50 MG/5ML IV SOLN: 1.0000 mg/kg | Freq: Once | INTRAVENOUS | Status: DC

## 2017-01-15 MED ORDER — ETOMIDATE 2 MG/ML IV SOLN
INTRAVENOUS | Status: AC
  Filled 2017-01-15: qty 10

## 2017-01-15 MED ORDER — SODIUM CHLORIDE 0.9 % IV SOLN
0.0000 ug/min | INTRAVENOUS | Status: DC
  Administered 2017-01-15: 120 ug/min via INTRAVENOUS
  Filled 2017-01-15: qty 40

## 2017-01-15 MED ORDER — ROCURONIUM BROMIDE 50 MG/5ML IV SOLN
INTRAVENOUS | Status: AC
  Administered 2017-01-15: 18:00:00
  Filled 2017-01-15: qty 1

## 2017-01-15 MED ORDER — NOREPINEPHRINE BITARTRATE 1 MG/ML IV SOLN
0.0000 ug/min | INTRAVENOUS | Status: DC
  Administered 2017-01-15: 40 ug/min via INTRAVENOUS
  Administered 2017-01-16: 36 ug/min via INTRAVENOUS
  Administered 2017-01-17 (×2): 20 ug/min via INTRAVENOUS
  Administered 2017-01-18: 7 ug/min via INTRAVENOUS
  Administered 2017-01-20: 10 ug/min via INTRAVENOUS
  Filled 2017-01-15 (×9): qty 16

## 2017-01-15 MED ORDER — EPINEPHRINE PF 1 MG/10ML IJ SOSY: 1.0000 mg | PREFILLED_SYRINGE | Freq: Once | INTRAMUSCULAR | Status: DC

## 2017-01-15 MED ORDER — VITAMIN B-1 100 MG PO TABS: 100.0000 mg | ORAL_TABLET | Freq: Every day | ORAL | Status: DC

## 2017-01-15 MED ORDER — HYDRALAZINE HCL 20 MG/ML IJ SOLN
10.0000 mg | INTRAMUSCULAR | Status: DC | PRN
  Administered 2017-01-15: 20 mg via INTRAVENOUS

## 2017-01-15 MED ORDER — ONDANSETRON HCL 4 MG PO TABS: 4.0000 mg | ORAL_TABLET | Freq: Four times a day (QID) | ORAL | Status: DC | PRN

## 2017-01-15 MED ORDER — DEXMEDETOMIDINE HCL IN NACL 400 MCG/100ML IV SOLN
0.0000 ug/kg/h | INTRAVENOUS | Status: DC
  Administered 2017-01-15: 0.571 ug/kg/h via INTRAVENOUS
  Filled 2017-01-15: qty 100

## 2017-01-15 MED ORDER — FENTANYL CITRATE (PF) 100 MCG/2ML IJ SOLN
INTRAMUSCULAR | Status: AC
  Filled 2017-01-15: qty 2

## 2017-01-15 MED ORDER — SODIUM CHLORIDE 0.9 % IV BOLUS (SEPSIS)
500.0000 mL | Freq: Once | INTRAVENOUS | Status: AC
  Administered 2017-01-15: 500 mL via INTRAVENOUS

## 2017-01-15 MED ORDER — FLUCONAZOLE IN SODIUM CHLORIDE 200-0.9 MG/100ML-% IV SOLN
200.0000 mg | INTRAVENOUS | Status: DC
  Filled 2017-01-15: qty 100

## 2017-01-15 MED ORDER — FENTANYL BOLUS VIA INFUSION
25.0000 ug | INTRAVENOUS | Status: DC | PRN
  Administered 2017-01-19 – 2017-01-20 (×3): 25 ug via INTRAVENOUS
  Filled 2017-01-15: qty 25

## 2017-01-15 MED ORDER — MORPHINE SULFATE (PF) 2 MG/ML IV SOLN: 2.0000 mg | INTRAVENOUS | Status: DC | PRN

## 2017-01-15 MED ORDER — ONDANSETRON HCL 4 MG/2ML IJ SOLN: 4.0000 mg | Freq: Four times a day (QID) | INTRAMUSCULAR | Status: DC | PRN

## 2017-01-15 NOTE — Significant Event (Signed)
Increasing agitation, tachycardia, hypertension, concern for EtOH withdrawal  Dex gtt resumed PRN Hydralazine to maintain SBP < 170 PRN metoprolol to maintain HR < 115/min  DC HCO3 infusion (to decrease solute load)  Repeat BMET ordered for now  Merton Border, MD PCCM service Mobile [phone] Pager [phone] 01/15/2017 3:11 PM

## 2017-01-15 NOTE — Progress Notes (Signed)
Pharmacy Antibiotic Note  Sonya Park is a 76 y.o. female admitted on 01/14/2017 with sepsis.  Pharmacy has been consulted for meropenem dosing.  Plan: Due to e coli detected on BCID, discontinue vancomycin and levofloxacin. Continue meropenem 1 g IV Q 12 H pending susceptibility information.  Continue empiric fluconazole due to mold exposure at home.  Height: 5\' 6"  (167.6 cm) Weight: 139 lb (63 kg) IBW/kg (Calculated) : 59.3  Temp (24hrs), Avg:98.3 F (36.8 C), Min:96.4 F (35.8 C), Max:103.1 F (39.5 C)   Recent Labs Lab 01/14/17 2008 01/14/17 2308 01/15/17 0330  WBC 1.9*  --  0.8*  CREATININE 1.07*  --  1.22*  LATICACIDVEN  --  6.7* 13.0*    Estimated Creatinine Clearance: 37.3 mL/min (A) (by C-G formula based on SCr of 1.22 mg/dL (H)).    Allergies  Allergen Reactions  . Other Itching    "mycin" doesn't know which one  . Penicillins Swelling    Has patient had a PCN reaction causing immediate rash, facial/tongue/throat swelling, SOB or lightheadedness with hypotension: Yes Has patient had a PCN reaction causing severe rash involving mucus membranes or skin necrosis: No Has patient had a PCN reaction that required hospitalization: No Has patient had a PCN reaction occurring within the last 10 years: No If all of the above answers are "NO", then may proceed with Cephalosporin use.    Antimicrobials this admission:  Levofloxacin 10/9 >> 10/10 Vancomycin 10/9 >> 10/10 Meropenem 10/9 >> Fluconazole 10/10 >>  Dose adjustments this admission:   Microbiology results: 10/9 BCx: gram negative rods 10/9 BCID: e coli 10/9 UCx: sent  10/10 Fungus Cx: sent  Thank you for allowing pharmacy to be a part of this patient's care.  Oralia Manis 01/15/2017 1:04 PM

## 2017-01-15 NOTE — Progress Notes (Signed)
[place] for electrolyte management   Pharmacy consulted for electrolyte management for 76 yo female admitted to the ICU with sepsis. Patient has history significant for alcohol abuse and is on CIWA. Patient currently NPO.   Plan:  Patient has received calcium gluconate 1g IV x 2, magnesium 4g IV x 1, and potassium 76mEq IV x 6. Per CCM will recheck electrolytes with am labs.   Allergies  Allergen Reactions  . Other Itching    "mycin" doesn't know which one  . Penicillins Swelling    Has patient had a PCN reaction causing immediate rash, facial/tongue/throat swelling, SOB or lightheadedness with hypotension: Yes Has patient had a PCN reaction causing severe rash involving mucus membranes or skin necrosis: No Has patient had a PCN reaction that required hospitalization: No Has patient had a PCN reaction occurring within the last 10 years: No If all of the above answers are "NO", then may proceed with Cephalosporin use.    Patient Measurements: Height: 5\' 6"  (167.6 cm) Weight: 156 lb 8.4 oz (71 kg) IBW/kg (Calculated) : 59.3  Vital Signs: Temp: 97.2 F (36.2 C) (10/10 1930) BP: 76/56 (10/10 1930) Pulse Rate: 78 (10/10 1545) Intake/Output from previous day: 10/09 0701 - 10/10 0700 In: 2874 [I.V.:1140.6; IV Piggyback:1733.3] Out: 175 [Urine:175] Intake/Output from this shift: No intake/output data recorded.  Labs:  Recent Labs  01/14/17 2008 01/15/17 0330 01/15/17 0634 01/15/17 1113 01/15/17 1521 01/15/17 1821  WBC 1.9* 0.8*  --   --   --   --   HGB 10.8* 8.5*  --  9.4*  --   --   HCT 31.8* 25.5*  --  28.3*  --   --   PLT 148* 84*  --   --   --   --   CREATININE 1.07* 1.22*  --  1.18* 1.17*  --   MG  --   --  1.4*  --   --  2.3  PHOS  --   --   --  2.9  --   --   ALBUMIN 2.5*  --   --  1.5*  --   --   PROT 4.9*  --   --   --   --   --   AST 137*  --   --   --   --   --   ALT 61*  --   --   --   --   --   ALKPHOS 126  --    --   --   --   --   BILITOT 3.8*  --   --   --   --   --    Estimated Creatinine Clearance: 38.9 mL/min (A) (by C-G formula based on SCr of 1.17 mg/dL (H)).    Medical History: Past Medical History:  Diagnosis Date  . Arthritis   . Back pain   . Insomnia   . Medical history non-contributory   . Osteoporosis     Pharmacy will continue to monitor and adjust per consult.   Simpson,Michael L 01/15/2017,8:07 PM

## 2017-01-15 NOTE — ED Notes (Signed)
IV team tried to insert 2 IV catheters on the Pt but they were able to insert only one. Dr. Jannifer Franklin was notified. RN suggested having a central line on the Pt due to hypotension and the inability to insert another peripheral IV catheter. Dr. Jannifer Franklin stated that he will put an order to have a central line put in place.

## 2017-01-15 NOTE — Progress Notes (Signed)
Dr Alva Garnet in to assess patient, spoke to husband about intubation and art line placement. Will go ahead with procedures. See flowsheets for details.

## 2017-01-15 NOTE — Progress Notes (Addendum)
Pharmacy Antibiotic Note  Sonya Park is a 76 y.o. female admitted on 01/14/2017 with sepsis.  Pharmacy has been consulted for fluconazole dosing. Patient is currently neutropenic and was exposed to mold at home, so empirically treating for fungi 10/10 EKG shows QTc of 488   Plan: Will give fluconazole 400 mg IV load x 1  Will start fluconazole 200 mg daily per CrCl < 50 ml/min  Height: 5\' 6"  (167.6 cm) Weight: 139 lb (63 kg) IBW/kg (Calculated) : 59.3  Temp (24hrs), Avg:99.9 F (37.7 C), Min:98.3 F (36.8 C), Max:103.1 F (39.5 C)   Recent Labs Lab 01/14/17 2008 01/14/17 2308 01/15/17 0330  WBC 1.9*  --  0.8*  CREATININE 1.07*  --  1.22*  LATICACIDVEN  --  6.7* 13.0*    Estimated Creatinine Clearance: 37.3 mL/min (A) (by C-G formula based on SCr of 1.22 mg/dL (H)).    Allergies  Allergen Reactions  . Other Itching    "mycin" doesn't know which one  . Penicillins Swelling    Has patient had a PCN reaction causing immediate rash, facial/tongue/throat swelling, SOB or lightheadedness with hypotension: Yes Has patient had a PCN reaction causing severe rash involving mucus membranes or skin necrosis: No Has patient had a PCN reaction that required hospitalization: No Has patient had a PCN reaction occurring within the last 10 years: No If all of the above answers are "NO", then may proceed with Cephalosporin use.    Thank you for allowing pharmacy to be a part of this patient's care.  Tobie Lords, PharmD, BCPS Clinical Pharmacist 01/15/2017

## 2017-01-15 NOTE — Consult Note (Signed)
Name: CLAUDETT BAYLY MRN: [id number] DOB: [birth year]    ADMISSION DATE:  01/14/2017 CONSULTATION DATE: 01/15/2017  REFERRING MD : Dr. Jannifer Franklin   CHIEF COMPLAINT: Fall  BRIEF PATIENT DESCRIPTION:  76 yo female admitted 10/9 following a fall at home found to be in septic shock with hypotension likely secondary to suspected left lower extremity cellulitis requiring vasopressor   SIGNIFICANT EVENTS  10/9-Pt admitted to ICU   STUDIES:  CT Abd, Pelvis, and Chest 10/9>>Age-indeterminate T12 inferior endplate compression deformity. Progressive chronic inferior endplate deformity involves the L3 vertebra. No evidence for solid organ or hollow viscus injury. Small pulmonary nodules measure up to 4 mm. Nonspecific. No follow-up needed if patient is low-risk (and has no known or suspected primary neoplasm). Non-contrast chest CT can be considered in 12 months if patient is high-risk.  Aortic Atherosclerosis (ICD10-I70.0). Lad coronary artery calcification noted. Hepatic steatosis. CT Maxillofacial 10/9>>Bilateral nasal bone fractures without significant displacement. Left frontal hemorrhage suggest an occult fracture. No discrete fracture is visualized. Extensive bilateral periorbital soft swelling and hematoma with inflammatory changes extending over the maxilla bilaterally, left greater than right. CT Head 10/9>>Periorbital and frontal scalp soft tissue thickening, without acute intracranial abnormality. New hyperattenuating material in the left frontal sinus could represent hemorrhage. No acute fracture is seen. If this is a clinical concern, consider dedicated face CT. Osseous irregularity about both nasal bones is felt to be grossly similar back to 10/16/2012. Correlate with clinical exam to exclude acute superimposed injury. Cerebral atrophy and small vessel ischemic change  HISTORY OF PRESENT ILLNESS:   This is a 76 yo female with a PMH of Osteoporosis, Insomnia, Back Pain, Arthritis, Bilateral  Knee Replacements on aspirin every other day, and ETOH abuse.  She presented to [facility] ER 10/9 following a fall at home around 11:30 pm on the night of 10/8. Per ER notes the pt was going to the bathroom and fell down about 3 stairs face first.  The pts husband stated she could have been in the floor for about an hour before he discovered her with blood underneath her.  Per ER notes the pt does not remember having any symptoms prior to the fall.  She went to her PCP on 10/9 and a CT Head revealed periorbital and frontal scalp soft tissue thickening and new hyperattenuating material in the left frontal hemorrhage concerning for occult fracture. Upon arrival to the ER lab results revealed wbc 1.9, hgb 10.8, platelets 148, lactic acid 6.7, and pt was hypotensive meeting sepsis criteria.  Therefore, she received 2L NS bolus and iv abx initiated.  She has been recently sleeping in an RV due to mold in her house.  She was subsequently admitted by hospitalist team for further workup and treatment PCCM consulted.    PAST MEDICAL HISTORY :   has a past medical history of Arthritis; Back pain; Insomnia; Medical history non-contributory; and Osteoporosis.  has a past surgical history that includes Abdominal hysterectomy; Replacement total knee (Left); Gastric bypass; Cosmetic surgery; Knee arthroscopy; Colonoscopy with propofol (N/A, 07/18/2015); Hemorroidectomy; Breast lumpectomy (Right); and Knee Arthroplasty (Right, 12/13/2015). Prior to Admission medications   Medication Sig Start Date End Date Taking? Authorizing Provider  aspirin 81 MG tablet Take 81 mg by mouth daily.   Yes [provider]  CALCIUM CARBONATE PO Take 1 tablet by mouth daily.    Yes [provider]  cholecalciferol (VITAMIN D) 1000 UNITS tablet Take 2,000 Units by mouth daily.    Yes [provider]  estrogens, conjugated, (PREMARIN) 0.625 MG tablet Take 0.625 mg by mouth daily. Take daily for 21 days then do not take for 7  days.   Yes [provider]  ibandronate (BONIVA) 150 MG tablet Take 150 mg by mouth every 30 (thirty) days. Take in the morning with a full glass of water, on an empty stomach, and do not take anything else by mouth or lie down for the next 30 min.   Yes [provider]  megestrol (MEGACE) 20 MG tablet Take 1 tablet by mouth daily. 12/30/16 01/29/17 Yes [provider]  Probiotic Product (DIGESTIVE ADVANTAGE GUMMIES) CHEW Chew 3 Doses by mouth daily.    Yes [provider]  clindamycin (CLEOCIN) 150 MG capsule Take 600 mg by mouth daily as needed. Dental work    Secondary school teacher, Historical, MD  DiphenhydrAMINE HCl (ZZZQUIL) 50 MG/30ML LIQD Take 30 mLs by mouth at bedtime as needed (for sleep).    [provider]  oxyCODONE (OXY IR/ROXICODONE) 5 MG immediate release tablet Take 1-2 tablets (5-10 mg total) by mouth every 4 (four) hours as needed for moderate pain. Patient not taking: Reported on 01/14/2017 12/15/15   Lattie Corns, PA-C  traMADol (ULTRAM) 50 MG tablet Take 1-2 tablets (50-100 mg total) by mouth every 4 (four) hours as needed for moderate pain. Patient not taking: Reported on 01/14/2017 12/15/15   Lattie Corns, PA-C   Allergies  Allergen Reactions  . Other Itching    "mycin" doesn't know which one  . Penicillins Swelling    Has patient had a PCN reaction causing immediate rash, facial/tongue/throat swelling, SOB or lightheadedness with hypotension: Yes Has patient had a PCN reaction causing severe rash involving mucus membranes or skin necrosis: No Has patient had a PCN reaction that required hospitalization: No Has patient had a PCN reaction occurring within the last 10 years: No If all of the above answers are "NO", then may proceed with Cephalosporin use.    FAMILY HISTORY:  family history includes CAD in her sister; Cancer in her father; Heart attack in her father; Liver disease in her mother. SOCIAL HISTORY:  reports that she  quit smoking about 22 years ago. She has never used smokeless tobacco. She reports that she drinks about 12.6 oz of alcohol per week . She reports that she does not use drugs.  REVIEW OF SYSTEMS:  Unable to assess pt uncomfortable and shivering   SUBJECTIVE:  Pt uncomfortable and shivering diffusely   VITAL SIGNS: Temp:  [98.3 F (36.8 C)-98.4 F (36.9 C)] 98.4 F (36.9 C) (10/10 0100) Pulse Rate:  [36-135] 135 (10/10 0100) Resp:  [17-26] 19 (10/10 0100) BP: (77-162)/(40-89) 80/58 (10/10 0100) SpO2:  [86 %-100 %] 100 % (10/10 0100) Weight:  [63 kg (139 lb)] 63 kg (139 lb) (10/09 1456)  PHYSICAL EXAMINATION: General: acutely ill appearing Caucasian female with diffuse shivering Neuro: alert to self, follows commands, PERRL HEENT: diffuse ecchymosis of forehead, nasal bridge, and bilateral periorbital tissue  Cardiovascular: sinus tach, s1s2, no M/R/G Lungs: diminished throughout, even, non labored  Abdomen: hypoactive BS x4, soft, non tender, non distended  Musculoskeletal: 2+ bilateral lower extremity edema, moves all extremities  Skin: abrasion left lower extremity with erythema, warm to touch, and weeping, bilateral feet cyanotic    Recent Labs Lab 01/14/17 2008  NA 136  K 4.1  CL 98*  CO2 24  BUN 13  CREATININE 1.07*  GLUCOSE 97    Recent Labs Lab 01/14/17 2008  HGB  10.8*  HCT 31.8*  WBC 1.9*  PLT 148*   Ct Head Wo Contrast  Result Date: 01/14/2017 CLINICAL DATA:  Fall.  Trauma to front of head. EXAM: CT HEAD WITHOUT CONTRAST TECHNIQUE: Contiguous axial images were obtained from the base of the skull through the vertex without intravenous contrast. COMPARISON:  08/01/2015 FINDINGS: Brain: Expected cerebral volume loss for age. Mild low density in the periventricular white matter likely related to small vessel disease. No mass lesion, hemorrhage, hydrocephalus, acute infarct, intra-axial, or extra-axial fluid collection. Vascular: Intracranial atherosclerosis.  Skull: Soft tissue swelling superficial to the superior aspect of with nose and about the frontal sinuses. This extends minimally into the right frontal scalp. No skull fracture. Sinuses/Orbits: Normal imaged portions of the orbits and globes. There is osseous irregularity involving both nasal bones, felt to be relatively similar to 10/16/2012. Partial ethmoid air cell opacification. hyperattenuating material in the left frontal sinus is new. Clear mastoid air cells. Other: None. IMPRESSION: 1. Periorbital and frontal scalp soft tissue thickening, without acute intracranial abnormality. 2. New hyperattenuating material in the left frontal sinus could represent hemorrhage. No acute fracture is seen. If this is a clinical concern, consider dedicated face CT. 3. Osseous irregularity about both nasal bones is felt to be grossly similar back to 10/16/2012. Correlate with clinical exam to exclude acute superimposed injury. 4.  Cerebral atrophy and small vessel ischemic change. Electronically Signed   By: Abigail Miyamoto M.D.   On: 01/14/2017 13:30   Ct Chest W Contrast  Result Date: 01/14/2017 CLINICAL DATA:  Fall. EXAM: CT CHEST, ABDOMEN, AND PELVIS WITH CONTRAST TECHNIQUE: Multidetector CT imaging of the chest, abdomen and pelvis was performed following the standard protocol during bolus administration of intravenous contrast. CONTRAST:  129mL ISOVUE-300 IOPAMIDOL (ISOVUE-300) INJECTION 61% COMPARISON:  01/17/2011 FINDINGS: CT CHEST FINDINGS Cardiovascular: Aortic atherosclerosis. Calcification in the LAD coronary artery noted. Normal heart size. No pericardial effusion. Mediastinum/Nodes: No enlarged mediastinal, hilar, or axillary lymph nodes. Thyroid gland, trachea, and esophagus demonstrate no significant findings. Lungs/Pleura: No pleural effusion identified. No pulmonary contusion or pneumothorax. 4 mm right middle lobe pulmonary nodule identified, image 85 of series 4. Left lower lobe nodule measures 3 mm, image  104 of series 4. Musculoskeletal: Spondylosis noted within the thoracic spine. Inferior endplate deformity involving the T12 vertebra is age indeterminate. No displaced rib fractures identified. CT ABDOMEN PELVIS FINDINGS Hepatobiliary: Marked diffuse hepatic steatosis. No focal liver abnormality. The gallbladder appears normal. No biliary dilatation. Pancreas: Unremarkable. No pancreatic ductal dilatation or surrounding inflammatory changes. Spleen: Spleen is unremarkable. Adrenals/Urinary Tract: No adrenal hemorrhage or renal injury identified. Bladder is unremarkable. 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 No pathologic dilatation of the large or small bowel loops. Vascular/Lymphatic: Aortic atherosclerosis. No aneurysm. No upper abdominal adenopathy. No pelvic or inguinal adenopathy. Reproductive: Status post hysterectomy. No adnexal masses. Other: No scratch set trace free fluid within the pelvis. No focal fluid collections. Musculoskeletal: Healed fracture deformity involving the right inferior pubic rami noted. No acute pelvic fracture noted. Chronic progressive superior endplate deformity involving the L3 vertebra. IMPRESSION: 1. Age-indeterminate T12 inferior endplate compression deformity. Progressive chronic inferior endplate deformity involves the L3 vertebra. 2. No evidence for solid organ or hollow viscus injury. 3. Small pulmonary nodules measure up to 4 mm. Nonspecific. No follow-up needed if patient is low-risk (and has no known or suspected primary neoplasm). Non-contrast chest CT can be considered in 12 months if patient is high-risk. This recommendation follows the consensus statement:  Guidelines for Management of Incidental Pulmonary Nodules Detected on CT Images: From the Fleischner Society 2017; Radiology 2017; 284:228-243. 4. Aortic Atherosclerosis (ICD10-I70.0). Lad coronary artery calcification noted. 5. Hepatic steatosis. Electronically Signed   By: Kerby Moors M.D.   On: 01/14/2017 21:54   Ct Abdomen Pelvis W Contrast  Result Date: 01/14/2017 CLINICAL DATA:  Fall. EXAM: CT CHEST, ABDOMEN, AND PELVIS WITH CONTRAST TECHNIQUE: Multidetector CT imaging of the chest, abdomen and pelvis was performed following the standard protocol during bolus administration of intravenous contrast. CONTRAST:  187mL ISOVUE-300 IOPAMIDOL (ISOVUE-300) INJECTION 61% COMPARISON:  01/17/2011 FINDINGS: CT CHEST FINDINGS Cardiovascular: Aortic atherosclerosis. Calcification in the LAD coronary artery noted. Normal heart size. No pericardial effusion. Mediastinum/Nodes: No enlarged mediastinal, hilar, or axillary lymph nodes. Thyroid gland, trachea, and esophagus demonstrate no significant findings. Lungs/Pleura: No pleural effusion identified. No pulmonary contusion or pneumothorax. 4 mm right middle lobe pulmonary nodule identified, image 85 of series 4. Left lower lobe nodule measures 3 mm, image 104 of series 4. Musculoskeletal: Spondylosis noted within the thoracic spine. Inferior endplate deformity involving the T12 vertebra is age indeterminate. No displaced rib fractures identified. CT ABDOMEN PELVIS FINDINGS Hepatobiliary: Marked diffuse hepatic steatosis. No focal liver abnormality. The gallbladder appears normal. No biliary dilatation. Pancreas: Unremarkable. No pancreatic ductal dilatation or surrounding inflammatory changes. Spleen: Spleen is unremarkable. Adrenals/Urinary Tract: No adrenal hemorrhage or renal injury identified. Bladder is unremarkable. 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 No pathologic dilatation of the large or small bowel loops. Vascular/Lymphatic: Aortic atherosclerosis. No aneurysm. No upper abdominal adenopathy. No pelvic or inguinal adenopathy. Reproductive: Status post hysterectomy. No adnexal masses. Other: No scratch set trace free fluid within the pelvis. No focal fluid collections. Musculoskeletal: Healed fracture  deformity involving the right inferior pubic rami noted. No acute pelvic fracture noted. Chronic progressive superior endplate deformity involving the L3 vertebra. IMPRESSION: 1. Age-indeterminate T12 inferior endplate compression deformity. Progressive chronic inferior endplate deformity involves the L3 vertebra. 2. No evidence for solid organ or hollow viscus injury. 3. Small pulmonary nodules measure up to 4 mm. Nonspecific. No follow-up needed if patient is low-risk (and has no known or suspected primary neoplasm). Non-contrast chest CT can be considered in 12 months if patient is high-risk. This recommendation follows the consensus statement: Guidelines for Management of Incidental Pulmonary Nodules Detected on CT Images: From the Fleischner Society 2017; Radiology 2017; 284:228-243. 4. Aortic Atherosclerosis (ICD10-I70.0). Lad coronary artery calcification noted. 5. Hepatic steatosis. Electronically Signed   By: Kerby Moors M.D.   On: 01/14/2017 21:54   Ct Maxillofacial Wo Contrast  Result Date: 01/14/2017 CLINICAL DATA:  Fall last night. Bruising and swelling to the face and chest. EXAM: CT MAXILLOFACIAL WITHOUT CONTRAST TECHNIQUE: Multidetector CT imaging of the maxillofacial structures was performed. Multiplanar CT image reconstructions were also generated. COMPARISON:  CT head without contrast from the same day. FINDINGS: Osseous: Bilateral nasal fractures are present. Hemorrhage in the left frontal sinus suggests an occult fracture. No discrete fracture is present. The visualized calvarium is intact. The mandible is intact and located. Advanced degenerative changes are noted within the TMJ, worse on the left. Orbits: Periorbital soft tissue swelling and hematoma is worse right than left. There is no underlying globe injury. Bilateral lens replacements are present. Sinuses: Minimal low-density fluid is present in the sphenoid sinuses bilaterally. Small polyps or mucous retention cysts are noted  inferiorly in the maxillary sinuses. Hemorrhages noted in the left frontal sinus. The remaining paranasal sinuses are clear.  The mastoid air cells are clear. Soft tissues: Focal high-density hemorrhage is noted over the bridge of the nose in the midline. Bilateral periorbital soft tissue swelling hematoma is present. There is soft tissue edema over the left side of the face. Calcifications in the subcutaneous tissues of face bilaterally may be related to prior trauma or injections. The airway is patent. The visualized soft tissues the neck are within normal limits. Limited intracranial: Within normal limits. IMPRESSION: 1. Bilateral nasal bone fractures without significant displacement. 2. Left frontal hemorrhage suggest an occult fracture. No discrete fracture is visualized. 3. Extensive bilateral periorbital soft swelling and hematoma with inflammatory changes extending over the maxilla bilaterally, left greater than right. Electronically Signed   By: San Morelle M.D.   On: 01/14/2017 15:28    ASSESSMENT / PLAN: Left frontal hemorrhage concerning for occult fracture and extensive bilateral periorbital soft swelling and hematoma s/p fall  Septic Shock with Hypotension likely secondary to suspected left lower extremity cellulitis  Lactic Acidosis  Neutropenia  Mild Rhabdomyolysis Anemia with acute blood loss  Transaminitis Hyperbilirubinemia Hx: ETOH Abuse  P: Supplemental O2 to maintain O2 sats >92% Prn CXR Aggressive fluid resuscitation NS @100  ml/hr Prn neo-synephrine to maintain map >65 Continuous telemetry monitoring Trend WBC and monitor fever curve  Neutropenic precautions  Trend PCT and lactic acid Continue abx  Follow cultures  Acute hepatic panel pending  Trend CMP Replace electrolytes as indicated  Monitor UOP Trend CBC SCD's for VTE prophylaxis, avoid chemical prophylaxis for now  Monitor for s/sx of bleeding Transfuse for hgb <7 CIWA protocol  Prn ativan and  precedex gtt per CIWA score Continue folic acid, thiamine, and mvi  Prn morphine for pain management  [name], [place] Pager [phone] (please enter 7 digits) PCCM Consult Pager [phone] (please enter 7 digits)

## 2017-01-15 NOTE — Procedures (Signed)
Oral Intubation Procedure Note  Indications: Respiratory insufficiency Consent: Unable to obtain consent because of altered level of consciousness. Time Out: Verified patient identification, verified procedure, site/side was marked, verified correct patient position, special equipment/implants available, medications/allergies/relevent history reviewed, required imaging and test results available.   Pre-meds: Etomidate 20 mg IV  Neuromuscular blockade: Rocuronium 50 mg IV  Laryngoscope: Glidescope  Visualization: cords fully visualized but very anterior with very small oral cavity  ETT: 7.5 ETT passed on first attempt and secured @ 24 cm at upper incisors  Findings: DIFFICULT INTUBATION DUE TO SMALL ORAL CAVITY AND ANTERIOR AIRWAY   Evaluation:  Tube position confirmed by auscultation and EZCap CXR reveals ETT tip in mid trachea   Merton Border, MD PCCM service Mobile [phone] Pager [phone] 01/15/2017

## 2017-01-15 NOTE — Progress Notes (Signed)
Dr Alva Garnet aware of erratic BP's. ABG ordered- 02 sats not reading

## 2017-01-15 NOTE — Progress Notes (Signed)
PHARMACY - PHYSICIAN COMMUNICATION CRITICAL VALUE ALERT - BLOOD CULTURE IDENTIFICATION (BCID)  Results for orders placed or performed during the hospital encounter of 01/14/17  Blood Culture ID Panel (Reflexed) (Collected: 01/14/2017 11:08 PM)  Result Value Ref Range   Enterococcus species NOT DETECTED NOT DETECTED   Listeria monocytogenes NOT DETECTED NOT DETECTED   Staphylococcus species NOT DETECTED NOT DETECTED   Staphylococcus aureus NOT DETECTED NOT DETECTED   Streptococcus species NOT DETECTED NOT DETECTED   Streptococcus agalactiae NOT DETECTED NOT DETECTED   Streptococcus pneumoniae NOT DETECTED NOT DETECTED   Streptococcus pyogenes NOT DETECTED NOT DETECTED   Acinetobacter baumannii NOT DETECTED NOT DETECTED   Enterobacteriaceae species DETECTED (A) NOT DETECTED   Enterobacter cloacae complex NOT DETECTED NOT DETECTED   Escherichia coli DETECTED (A) NOT DETECTED   Klebsiella oxytoca NOT DETECTED NOT DETECTED   Klebsiella pneumoniae NOT DETECTED NOT DETECTED   Proteus species NOT DETECTED NOT DETECTED   Serratia marcescens NOT DETECTED NOT DETECTED   Carbapenem resistance NOT DETECTED NOT DETECTED   Haemophilus influenzae NOT DETECTED NOT DETECTED   Neisseria meningitidis NOT DETECTED NOT DETECTED   Pseudomonas aeruginosa NOT DETECTED NOT DETECTED   Candida albicans NOT DETECTED NOT DETECTED   Candida glabrata NOT DETECTED NOT DETECTED   Candida krusei NOT DETECTED NOT DETECTED   Candida parapsilosis NOT DETECTED NOT DETECTED   Candida tropicalis NOT DETECTED NOT DETECTED    Name of physician (or Provider) Contacted: Dr. Mortimer Fries   Changes to prescribed antibiotics required: discontinue vancomycin and continue meropenem.   Simpson,Michael L 01/15/2017  12:20 PM

## 2017-01-15 NOTE — Procedures (Signed)
RIGHT FEMORAL ARTERIAL CATHETER  R fem catheter placed using Arrow kit and under sterile conditions for continuous monitoring of BP   Merton Border, MD PCCM service Mobile [phone] Pager [phone] 01/15/2017 6:35 PM

## 2017-01-15 NOTE — Progress Notes (Signed)
[facility] [place] NAME: Sonya Park    MR#:  [id number]  DATE OF BIRTH:  1940/11/12  SUBJECTIVE:   Patient here after a mechanical fall with significant bruising on her face. Also noted to be in severe shock with lactic acidosis suspected to be septic shock but no clear source of infection identified. Patient remains on 2 vasopressors and critically ill. Patient is encephalopathic and lethargic and therefore difficult to get a good history.  REVIEW OF SYSTEMS:    Review of Systems  Unable to perform ROS: Mental acuity    Nutrition: Heart Healthy Tolerating Diet: Yes Tolerating PT: Await Eval.   DRUG ALLERGIES:   Allergies  Allergen Reactions  . Other Itching    "mycin" doesn't know which one  . Penicillins Swelling    Has patient had a PCN reaction causing immediate rash, facial/tongue/throat swelling, SOB or lightheadedness with hypotension: Yes Has patient had a PCN reaction causing severe rash involving mucus membranes or skin necrosis: No Has patient had a PCN reaction that required hospitalization: No Has patient had a PCN reaction occurring within the last 10 years: No If all of the above answers are "NO", then may proceed with Cephalosporin use.    VITALS:  Blood pressure (!) 118/100, pulse (!) 111, temperature 97.9 F (36.6 C), resp. rate (!) 24, height 5\' 6"  (1.676 m), weight 63 kg (139 lb), SpO2 100 %.  PHYSICAL EXAMINATION:   Physical Exam  GENERAL:  76 y.o.-year-old patient lying in bed lethargic, encephalopathic.   EYES: Pupils equal, round, reactive to light. No scleral icterus. Extraocular muscles intact. Significant bruising and periorbital edema with bruising from her recent fall. HEENT: Significant bruising around the face and periorbital area.,normocephalic. Oropharynx and nasopharynx clear.  NECK:  Supple, no jugular venous distention. No thyroid enlargement, no tenderness.  LUNGS: Normal breath sounds  bilaterally, no wheezing, rales, rhonchi. No use of accessory muscles of respiration.  CARDIOVASCULAR: S1, S2 normal. No murmurs, rubs, or gallops.  ABDOMEN: Soft, nontender, nondistended. Bowel sounds present. No organomegaly or mass.  EXTREMITIES: No cyanosis, clubbing or edema b/l.    NEUROLOGIC: Cranial nerves II through XII are intact. No focal Motor or sensory deficits b/l.  Globally weak.  PSYCHIATRIC: The patient is alert and oriented x 1.  SKIN: No obvious rash, lesion, or ulcer.    LABORATORY PANEL:   CBC  Recent Labs Lab 01/15/17 0330 01/15/17 1113  WBC 0.8*  --   HGB 8.5* 9.4*  HCT 25.5* 28.3*  PLT 84*  --    ------------------------------------------------------------------------------------------------------------------  Chemistries   Recent Labs Lab 01/14/17 2008  01/15/17 0634 01/15/17 1113  NA 136  < >  --  135  K 4.1  < >  --  3.9  CL 98*  < >  --  107  CO2 24  < >  --  11*  GLUCOSE 97  < >  --  122*  BUN 13  < >  --  12  CREATININE 1.07*  < >  --  1.18*  CALCIUM 7.6*  < >  --  6.0*  MG  --   --  1.4*  --   AST 137*  --   --   --   ALT 61*  --   --   --   ALKPHOS 126  --   --   --   BILITOT 3.8*  --   --   --   < > =  values in this interval not displayed. ------------------------------------------------------------------------------------------------------------------  Cardiac Enzymes  Recent Labs Lab 01/14/17 2008  TROPONINI <0.03   ------------------------------------------------------------------------------------------------------------------  RADIOLOGY:  Ct Head Wo Contrast  Result Date: 01/14/2017 CLINICAL DATA:  Fall.  Trauma to front of head. EXAM: CT HEAD WITHOUT CONTRAST TECHNIQUE: Contiguous axial images were obtained from the base of the skull through the vertex without intravenous contrast. COMPARISON:  08/01/2015 FINDINGS: Brain: Expected cerebral volume loss for age. Mild low density in the periventricular white matter likely  related to small vessel disease. No mass lesion, hemorrhage, hydrocephalus, acute infarct, intra-axial, or extra-axial fluid collection. Vascular: Intracranial atherosclerosis. Skull: Soft tissue swelling superficial to the superior aspect of with nose and about the frontal sinuses. This extends minimally into the right frontal scalp. No skull fracture. Sinuses/Orbits: Normal imaged portions of the orbits and globes. There is osseous irregularity involving both nasal bones, felt to be relatively similar to 10/16/2012. Partial ethmoid air cell opacification. hyperattenuating material in the left frontal sinus is new. Clear mastoid air cells. Other: None. IMPRESSION: 1. Periorbital and frontal scalp soft tissue thickening, without acute intracranial abnormality. 2. New hyperattenuating material in the left frontal sinus could represent hemorrhage. No acute fracture is seen. If this is a clinical concern, consider dedicated face CT. 3. Osseous irregularity about both nasal bones is felt to be grossly similar back to 10/16/2012. Correlate with clinical exam to exclude acute superimposed injury. 4.  Cerebral atrophy and small vessel ischemic change. Electronically Signed   By: Abigail Miyamoto M.D.   On: 01/14/2017 13:30   Ct Chest W Contrast  Result Date: 01/14/2017 CLINICAL DATA:  Fall. EXAM: CT CHEST, ABDOMEN, AND PELVIS WITH CONTRAST TECHNIQUE: Multidetector CT imaging of the chest, abdomen and pelvis was performed following the standard protocol during bolus administration of intravenous contrast. CONTRAST:  130mL ISOVUE-300 IOPAMIDOL (ISOVUE-300) INJECTION 61% COMPARISON:  01/17/2011 FINDINGS: CT CHEST FINDINGS Cardiovascular: Aortic atherosclerosis. Calcification in the LAD coronary artery noted. Normal heart size. No pericardial effusion. Mediastinum/Nodes: No enlarged mediastinal, hilar, or axillary lymph nodes. Thyroid gland, trachea, and esophagus demonstrate no significant findings. Lungs/Pleura: No pleural  effusion identified. No pulmonary contusion or pneumothorax. 4 mm right middle lobe pulmonary nodule identified, image 85 of series 4. Left lower lobe nodule measures 3 mm, image 104 of series 4. Musculoskeletal: Spondylosis noted within the thoracic spine. Inferior endplate deformity involving the T12 vertebra is age indeterminate. No displaced rib fractures identified. CT ABDOMEN PELVIS FINDINGS Hepatobiliary: Marked diffuse hepatic steatosis. No focal liver abnormality. The gallbladder appears normal. No biliary dilatation. Pancreas: Unremarkable. No pancreatic ductal dilatation or surrounding inflammatory changes. Spleen: Spleen is unremarkable. Adrenals/Urinary Tract: No adrenal hemorrhage or renal injury identified. Bladder is unremarkable. 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 No pathologic dilatation of the large or small bowel loops. Vascular/Lymphatic: Aortic atherosclerosis. No aneurysm. No upper abdominal adenopathy. No pelvic or inguinal adenopathy. Reproductive: Status post hysterectomy. No adnexal masses. Other: No scratch set trace free fluid within the pelvis. No focal fluid collections. Musculoskeletal: Healed fracture deformity involving the right inferior pubic rami noted. No acute pelvic fracture noted. Chronic progressive superior endplate deformity involving the L3 vertebra. IMPRESSION: 1. Age-indeterminate T12 inferior endplate compression deformity. Progressive chronic inferior endplate deformity involves the L3 vertebra. 2. No evidence for solid organ or hollow viscus injury. 3. Small pulmonary nodules measure up to 4 mm. Nonspecific. No follow-up needed if patient is low-risk (and has no known or suspected primary neoplasm). Non-contrast chest CT can be considered  in 12 months if patient is high-risk. This recommendation follows the consensus statement: Guidelines for Management of Incidental Pulmonary Nodules Detected on CT Images: From the Fleischner  Society 2017; Radiology 2017; 284:228-243. 4. Aortic Atherosclerosis (ICD10-I70.0). Lad coronary artery calcification noted. 5. Hepatic steatosis. Electronically Signed   By: Kerby Moors M.D.   On: 01/14/2017 21:54   Ct Abdomen Pelvis W Contrast  Result Date: 01/14/2017 CLINICAL DATA:  Fall. EXAM: CT CHEST, ABDOMEN, AND PELVIS WITH CONTRAST TECHNIQUE: Multidetector CT imaging of the chest, abdomen and pelvis was performed following the standard protocol during bolus administration of intravenous contrast. CONTRAST:  138mL ISOVUE-300 IOPAMIDOL (ISOVUE-300) INJECTION 61% COMPARISON:  01/17/2011 FINDINGS: CT CHEST FINDINGS Cardiovascular: Aortic atherosclerosis. Calcification in the LAD coronary artery noted. Normal heart size. No pericardial effusion. Mediastinum/Nodes: No enlarged mediastinal, hilar, or axillary lymph nodes. Thyroid gland, trachea, and esophagus demonstrate no significant findings. Lungs/Pleura: No pleural effusion identified. No pulmonary contusion or pneumothorax. 4 mm right middle lobe pulmonary nodule identified, image 85 of series 4. Left lower lobe nodule measures 3 mm, image 104 of series 4. Musculoskeletal: Spondylosis noted within the thoracic spine. Inferior endplate deformity involving the T12 vertebra is age indeterminate. No displaced rib fractures identified. CT ABDOMEN PELVIS FINDINGS Hepatobiliary: Marked diffuse hepatic steatosis. No focal liver abnormality. The gallbladder appears normal. No biliary dilatation. Pancreas: Unremarkable. No pancreatic ductal dilatation or surrounding inflammatory changes. Spleen: Spleen is unremarkable. Adrenals/Urinary Tract: No adrenal hemorrhage or renal injury identified. Bladder is unremarkable. 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 No pathologic dilatation of the large or small bowel loops. Vascular/Lymphatic: Aortic atherosclerosis. No aneurysm. No upper abdominal adenopathy. No pelvic or inguinal  adenopathy. Reproductive: Status post hysterectomy. No adnexal masses. Other: No scratch set trace free fluid within the pelvis. No focal fluid collections. Musculoskeletal: Healed fracture deformity involving the right inferior pubic rami noted. No acute pelvic fracture noted. Chronic progressive superior endplate deformity involving the L3 vertebra. IMPRESSION: 1. Age-indeterminate T12 inferior endplate compression deformity. Progressive chronic inferior endplate deformity involves the L3 vertebra. 2. No evidence for solid organ or hollow viscus injury. 3. Small pulmonary nodules measure up to 4 mm. Nonspecific. No follow-up needed if patient is low-risk (and has no known or suspected primary neoplasm). Non-contrast chest CT can be considered in 12 months if patient is high-risk. This recommendation follows the consensus statement: Guidelines for Management of Incidental Pulmonary Nodules Detected on CT Images: From the Fleischner Society 2017; Radiology 2017; 284:228-243. 4. Aortic Atherosclerosis (ICD10-I70.0). Lad coronary artery calcification noted. 5. Hepatic steatosis. Electronically Signed   By: Kerby Moors M.D.   On: 01/14/2017 21:54   US Venous Img Lower Unilateral Left  Result Date: 01/15/2017 CLINICAL DATA:  Lower extremity edema EXAM: LEFT LOWER EXTREMITY VENOUS DOPPLER ULTRASOUND TECHNIQUE: Gray-scale sonography with graded compression, as well as color Doppler and duplex ultrasound were performed to evaluate the lower extremity deep venous systems from the level of the common femoral vein and including the common femoral, femoral, profunda femoral, popliteal and calf veins including the posterior tibial, peroneal and gastrocnemius veins when visible. The superficial great saphenous vein was also interrogated. Spectral Doppler was utilized to evaluate flow at rest and with distal augmentation maneuvers in the common femoral, femoral and popliteal veins. COMPARISON:  11/02/2009 FINDINGS:  Contralateral Common Femoral Vein: Respiratory phasicity is normal and symmetric with the symptomatic side. No evidence of thrombus. Normal compressibility. Common Femoral Vein: No evidence of thrombus. Normal compressibility, respiratory phasicity and response to augmentation.  Saphenofemoral Junction: No evidence of thrombus. Normal compressibility and flow on color Doppler imaging. Profunda Femoral Vein: No evidence of thrombus. Normal compressibility and flow on color Doppler imaging. Femoral Vein: No evidence of thrombus. Normal compressibility, respiratory phasicity and response to augmentation. Popliteal Vein: No evidence of thrombus. Normal compressibility, respiratory phasicity and response to augmentation. Calf Veins: No evidence of thrombus. Normal compressibility and flow on color Doppler imaging. Other Findings:  None. IMPRESSION: No evidence of DVT within the left lower extremity. Electronically Signed   By: Misty Stanley M.D.   On: 01/15/2017 09:32   Dg Chest Port 1 View  Result Date: 01/15/2017 CLINICAL DATA:  Central line placement.  Initial encounter. EXAM: PORTABLE CHEST 1 VIEW COMPARISON:  Chest radiograph performed 04/25/2014 FINDINGS: A right IJ line is noted ending about the mid SVC. The lungs are well-aerated and clear. There is no evidence of focal opacification, pleural effusion or pneumothorax. The cardiomediastinal silhouette is within normal limits. No acute osseous abnormalities are seen. IMPRESSION: Right IJ line noted ending about the mid SVC. Lungs clear bilaterally. Electronically Signed   By: Garald Balding M.D.   On: 01/15/2017 04:16   Ct Maxillofacial Wo Contrast  Result Date: 01/14/2017 CLINICAL DATA:  Fall last night. Bruising and swelling to the face and chest. EXAM: CT MAXILLOFACIAL WITHOUT CONTRAST TECHNIQUE: Multidetector CT imaging of the maxillofacial structures was performed. Multiplanar CT image reconstructions were also generated. COMPARISON:  CT head without  contrast from the same day. FINDINGS: Osseous: Bilateral nasal fractures are present. Hemorrhage in the left frontal sinus suggests an occult fracture. No discrete fracture is present. The visualized calvarium is intact. The mandible is intact and located. Advanced degenerative changes are noted within the TMJ, worse on the left. Orbits: Periorbital soft tissue swelling and hematoma is worse right than left. There is no underlying globe injury. Bilateral lens replacements are present. Sinuses: Minimal low-density fluid is present in the sphenoid sinuses bilaterally. Small polyps or mucous retention cysts are noted inferiorly in the maxillary sinuses. Hemorrhages noted in the left frontal sinus. The remaining paranasal sinuses are clear. The mastoid air cells are clear. Soft tissues: Focal high-density hemorrhage is noted over the bridge of the nose in the midline. Bilateral periorbital soft tissue swelling hematoma is present. There is soft tissue edema over the left side of the face. Calcifications in the subcutaneous tissues of face bilaterally may be related to prior trauma or injections. The airway is patent. The visualized soft tissues the neck are within normal limits. Limited intracranial: Within normal limits. IMPRESSION: 1. Bilateral nasal bone fractures without significant displacement. 2. Left frontal hemorrhage suggest an occult fracture. No discrete fracture is visualized. 3. Extensive bilateral periorbital soft swelling and hematoma with inflammatory changes extending over the maxilla bilaterally, left greater than right. Electronically Signed   By: San Morelle M.D.   On: 01/14/2017 15:28     ASSESSMENT AND PLAN:   76 year old female with past medical history of back pain, osteoporosis, arthritis, alcohol abuse who presents to the hospital after a fall and noted to have significant facial and periorbital bruising along with nasal bone fractures and also noted to be in severe shock.  1.  Shock-this is suspected to be septic shock. Patient presented with severe hypotension, elevated lactic acid. The source of the sepsis remains unclear. Patient's blood cultures were positive for Enterobacter species. Continue empiric meropenem - UA was (-), CT chest abdomen pelvis was negative for acute pathology. -Continue supportive care with IV  fluids, IV vasopressors.  2. Alcohol abuse-patient is high risk for alcohol withdrawal. Continue CIWA, continue Precedex drip.  3. Altered mental status/encephalopathy-etiology unclear but likely secondary to severe hypotension and shock. CT head was negative for acute pathology. -Continue supportive care to treat underlying severe shock and follow mental status.  4. Pancytopenia-etiology unclear but suspected to be secondary to alcohol abuse with bone marrow suppression. Also could be secondary to underlying sepsis. Will follow serial CBCs. Continue with neutropenic precautions, continue empiric antibiotics mentioned above.  5. Hypokalemia/hypomagnesemia-continue ICU replacement protocol. Follow electrolyte levels.   All the records are reviewed and case discussed with Care Management/Social Worker. Management plans discussed with the patient, family and they are in agreement.  CODE STATUS: Full code  DVT Prophylaxis: Hep SQ  TOTAL TIME TAKING CARE OF THIS PATIENT: 30 minutes.   POSSIBLE D/C unclear DEPENDING ON CLINICAL CONDITION and progress.   Henreitta Leber M.D on 01/15/2017 at 3:05 PM  Between 7am to 6pm - Pager - [phone]  After 6pm go to [email or web address] - Proofreader  [facility]  Office  [phone]  CC: Primary care physician; Cletis Athens, MD

## 2017-01-15 NOTE — Progress Notes (Signed)
Called to bedside by RN for labile BP, progressive worsening of LOC, markedly abnormal ABG.   Vitals:   01/15/17 1545 01/15/17 1547 01/15/17 1600 01/15/17 1700  BP: (!) 62/49 100/76 (!) 169/153   Pulse: 78     Resp:  (!) 21 20 (!) 23  Temp: 97.9 F (36.6 C) 97.9 F (36.6 C) 97.9 F (36.6 C) 97.9 F (36.6 C)  TempSrc:      SpO2:      Weight:      Height:      Pt is markedly somnolent, minimally responsive Extensive bilateral periorbital contusions Chest clear anteriorly Diminished heart sounds Pluses markedly diminished Ext cool, minimal  Minimal spontaneous movement  BMP Latest Ref Rng & Units 01/15/2017 01/15/2017 01/15/2017  Glucose 65 - 99 mg/dL 87 122(H) 79  BUN 6 - 20 mg/dL 12 12 12   Creatinine 0.44 - 1.00 mg/dL 1.17(H) 1.18(H) 1.22(H)  Sodium 135 - 145 mmol/L 135 135 137  Potassium 3.5 - 5.1 mmol/L 3.9 3.9 2.9(L)  Chloride 101 - 111 mmol/L 105 107 107  CO2 22 - 32 mmol/L 13(L) 11(L) 13(L)  Calcium 8.9 - 10.3 mg/dL 6.3(LL) 6.0(LL) 6.0(LL)   CBC Latest Ref Rng & Units 01/15/2017 01/15/2017 01/14/2017  WBC 3.6 - 11.0 K/uL - 0.8(LL) 1.9(L)  Hemoglobin 12.0 - 16.0 g/dL 9.4(L) 8.5(L) 10.8(L)  Hematocrit 35.0 - 47.0 % 28.3(L) 25.5(L) 31.8(L)  Platelets 150 - 440 K/uL - 84(L) 148(L)    CXR: post intubation -ETT well positioned, LLL atx  IMPRESSION: Presumed severe sepsis/septic shock Labile BP Severe AMS Severe metabolic acidosis  PLAN/REC: Intubated due to AMS Volume resuscitate Resume vasopressors HCO3 gtt Husband updated @ bedside  CCM time: 45 mins The above time includes time spent in consultation with patient and/or family members and reviewing care plan on multidisciplinary rounds  Merton Border, MD PCCM service Mobile [phone] Pager [phone] 01/15/2017 6:02 PM

## 2017-01-15 NOTE — Progress Notes (Signed)
Pharmacy Antibiotic Note  Sonya Park is a 76 y.o. female admitted on 01/14/2017 with fall presenting w/ SIRS criteria and neutropenia.  Pharmacy has been consulted for vanc/levaquin/meropenem dosing.  Plan: Patient received vanc 1g and levaquin 750 mg IV x 1 in ED  Will f/u w/ vanc 1.25g IV q24h w/ 8 hr stack dose. Will draw VT 10/13 @ 0600 prior to 4th dose. Will start levaquin 750 mg IV q48h to start 10/11 @ 2200. No EKG to assess QTc. Consult was initially placed for aztreonam, but suggested to switch to meropenem for broader coverage. Will start meropenem 1g IV q12h CrCl 20 - 50 ml/min.  Ke 0.0396 T1/2 17.5 ~ 24 hrs and increasing dose to 1.25g Goal trough 15 - 20 mcg/mL  Height: 5\' 6"  (167.6 cm) Weight: 139 lb (63 kg) IBW/kg (Calculated) : 59.3  Temp (24hrs), Avg:98.3 F (36.8 C), Min:98.3 F (36.8 C), Max:98.3 F (36.8 C)   Recent Labs Lab 01/14/17 2008 01/14/17 2308  WBC 1.9*  --   CREATININE 1.07*  --   LATICACIDVEN  --  6.7*    Estimated Creatinine Clearance: 42.5 mL/min (A) (by C-G formula based on SCr of 1.07 mg/dL (H)).    Allergies  Allergen Reactions  . Other Itching    "mycin" doesn't know which one  . Penicillins Swelling    Has patient had a PCN reaction causing immediate rash, facial/tongue/throat swelling, SOB or lightheadedness with hypotension: Yes Has patient had a PCN reaction causing severe rash involving mucus membranes or skin necrosis: No Has patient had a PCN reaction that required hospitalization: No Has patient had a PCN reaction occurring within the last 10 years: No If all of the above answers are "NO", then may proceed with Cephalosporin use.    Thank you for allowing pharmacy to be a part of this patient's care.  Tobie Lords, PharmD, BCPS Clinical Pharmacist 01/15/2017

## 2017-01-15 NOTE — Procedures (Signed)
Central Venous Catheter Insertion Procedure Note Sonya Park [id number] Oct 16, 1940  Procedure: Insertion of Central Venous Catheter Indications: Assessment of intravascular volume, Drug and/or fluid administration and Frequent blood sampling  Procedure Details Consent: Emergent Time Out: Verified patient identification, verified procedure, site/side was marked, verified correct patient position, special equipment/implants available, medications/allergies/relevent history reviewed, required imaging and test results available.  Performed  Maximum sterile technique was used including antiseptics, cap, gloves, gown, hand hygiene, mask and sheet. Skin prep: Chlorhexidine; local anesthetic administered A antimicrobial bonded/coated triple lumen catheter was placed in the right internal jugular vein using the Seldinger technique.  Evaluation Blood flow good Complications: No apparent complications Patient did tolerate procedure well. Chest X-ray ordered to verify placement.  CXR: pending.  Right internal jugular central line placed utilizing ultrasound no complications noted during or following procedure.  Sonya Park, [place] Pager [phone] (please enter 7 digits) PCCM Consult Pager [phone] (please enter 7 digits)

## 2017-01-16 ENCOUNTER — Inpatient Hospital Stay: Payer: Medicare Other

## 2017-01-16 DIAGNOSIS — R7881 Bacteremia: Secondary | ICD-10-CM

## 2017-01-16 LAB — BLOOD GAS, ARTERIAL
Acid-base deficit: 2.8 mmol/L — ABNORMAL HIGH (ref 0.0–2.0)
Bicarbonate: 19.5 mmol/L — ABNORMAL LOW (ref 20.0–28.0)
FIO2: 0.35
O2 Saturation: 98.4 %
PEEP: 5 cmH2O
Patient temperature: 37
RATE: 20 resp/min
VT: 450 mL
pCO2 arterial: 25 mmHg — ABNORMAL LOW (ref 32.0–48.0)
pH, Arterial: 7.5 — ABNORMAL HIGH (ref 7.350–7.450)
pO2, Arterial: 103 mmHg (ref 83.0–108.0)

## 2017-01-16 LAB — BASIC METABOLIC PANEL
ANION GAP: 16 — AB (ref 5–15)
Anion gap: 17 — ABNORMAL HIGH (ref 5–15)
BUN: 13 mg/dL (ref 6–20)
BUN: 13 mg/dL (ref 6–20)
CALCIUM: 6 mg/dL — AB (ref 8.9–10.3)
CALCIUM: 6 mg/dL — AB (ref 8.9–10.3)
CHLORIDE: 98 mmol/L — AB (ref 101–111)
CO2: 20 mmol/L — ABNORMAL LOW (ref 22–32)
CO2: 24 mmol/L (ref 22–32)
CREATININE: 0.99 mg/dL (ref 0.44–1.00)
Chloride: 103 mmol/L (ref 101–111)
Creatinine, Ser: 1.01 mg/dL — ABNORMAL HIGH (ref 0.44–1.00)
GFR calc Af Amer: >60 mL/min (ref >60)
GFR calc Af Amer: >60 mL/min (ref >60)
GFR calc non Af Amer: 54 mL/min — ABNORMAL LOW (ref >60)
GFR, EST NON AFRICAN AMERICAN: 53 mL/min — AB (ref >60)
GLUCOSE: 62 mg/dL — AB (ref 65–99)
Glucose, Bld: 126 mg/dL — ABNORMAL HIGH (ref 65–99)
Potassium: 3.9 mmol/L (ref 3.5–5.1)
Potassium: 3.8 mmol/L (ref 3.5–5.1)
SODIUM: 139 mmol/L (ref 135–145)
SODIUM: 139 mmol/L (ref 135–145)

## 2017-01-16 LAB — URINE CULTURE: Culture: <10000 — AB

## 2017-01-16 LAB — GLUCOSE, CAPILLARY
GLUCOSE-CAPILLARY: 103 mg/dL — AB (ref 65–99)
GLUCOSE-CAPILLARY: 129 mg/dL — AB (ref 65–99)
GLUCOSE-CAPILLARY: 132 mg/dL — AB (ref 65–99)
GLUCOSE-CAPILLARY: 30 mg/dL — AB (ref 65–99)
GLUCOSE-CAPILLARY: 45 mg/dL — AB (ref 65–99)
GLUCOSE-CAPILLARY: 54 mg/dL — AB (ref 65–99)
GLUCOSE-CAPILLARY: 76 mg/dL (ref 65–99)
GLUCOSE-CAPILLARY: 90 mg/dL (ref 65–99)
Glucose-Capillary: 71 mg/dL (ref 65–99)
Glucose-Capillary: 69 mg/dL (ref 65–99)

## 2017-01-16 LAB — CBC
HCT: 31.4 % — ABNORMAL LOW (ref 35.0–47.0)
Hemoglobin: 10.6 g/dL — ABNORMAL LOW (ref 12.0–16.0)
MCH: 36.8 pg — ABNORMAL HIGH (ref 26.0–34.0)
MCHC: 33.7 g/dL (ref 32.0–36.0)
MCV: 109.1 fL — ABNORMAL HIGH (ref 80.0–100.0)
Platelets: 54 10*3/uL — ABNORMAL LOW (ref 150–440)
RBC: 2.88 MIL/uL — ABNORMAL LOW (ref 3.80–5.20)
RDW: 17.3 % — AB (ref 11.5–14.5)
WBC: 7.6 10*3/uL (ref 3.6–11.0)

## 2017-01-16 LAB — HEPATITIS PANEL, ACUTE
HEP B C IGM: NEGATIVE
HEP B S AG: NEGATIVE
Hep A IgM: NEGATIVE

## 2017-01-16 LAB — HEPATIC FUNCTION PANEL
ALBUMIN: 1.7 g/dL — AB (ref 3.5–5.0)
ALT: 50 U/L (ref 14–54)
AST: 182 U/L — AB (ref 15–41)
Alkaline Phosphatase: 89 U/L (ref 38–126)
Bilirubin, Direct: 2.1 mg/dL — ABNORMAL HIGH (ref 0.1–0.5)
Indirect Bilirubin: 1.2 mg/dL — ABNORMAL HIGH (ref 0.3–0.9)
TOTAL PROTEIN: 3.7 g/dL — AB (ref 6.5–8.1)
Total Bilirubin: 3.3 mg/dL — ABNORMAL HIGH (ref 0.3–1.2)

## 2017-01-16 LAB — CALCIUM, IONIZED: CALCIUM, IONIZED, SERUM: 3.3 mg/dL — AB (ref 4.5–5.6)

## 2017-01-16 LAB — LACTIC ACID, PLASMA
LACTIC ACID, VENOUS: 10.5 mmol/L — AB (ref 0.5–1.9)
LACTIC ACID, VENOUS: 9.7 mmol/L — AB (ref 0.5–1.9)

## 2017-01-16 LAB — PROCALCITONIN: PROCALCITONIN: 8.27 ng/mL

## 2017-01-16 MED ORDER — SODIUM CHLORIDE 0.9 % IV BOLUS (SEPSIS)
1000.0000 mL | Freq: Once | INTRAVENOUS | Status: AC
  Administered 2017-01-16: 1000 mL via INTRAVENOUS

## 2017-01-16 MED ORDER — SODIUM CHLORIDE 0.9 % IV SOLN
1.0000 g | Freq: Once | INTRAVENOUS | Status: AC
  Administered 2017-01-16: 1 g via INTRAVENOUS
  Filled 2017-01-16: qty 10

## 2017-01-16 MED ORDER — DEXTROSE 50 % IV SOLN
1.0000 | Freq: Once | INTRAVENOUS | Status: AC
  Administered 2017-01-16: 50 mL via INTRAVENOUS

## 2017-01-16 MED ORDER — DEXTROSE 50 % IV SOLN
1.0000 | Freq: Once | INTRAVENOUS | Status: AC
  Administered 2017-01-16: 50 mL via INTRAVENOUS
  Filled 2017-01-16: qty 50

## 2017-01-16 MED ORDER — DEXTROSE 50 % IV SOLN
INTRAVENOUS | Status: AC
  Filled 2017-01-16: qty 50

## 2017-01-16 MED ORDER — DEXTROSE-NACL 5-0.45 % IV SOLN
INTRAVENOUS | Status: DC
  Administered 2017-01-16: 18:00:00 via INTRAVENOUS
  Administered 2017-01-17: 75 mL/h via INTRAVENOUS

## 2017-01-16 MED ORDER — DEXTROSE 50 % IV SOLN
INTRAVENOUS | Status: AC
Start: 2017-01-16 — End: 2017-01-16
  Administered 2017-01-16: 50 mL
  Filled 2017-01-16: qty 50

## 2017-01-16 MED ORDER — HYDROCORTISONE NA SUCCINATE PF 100 MG IJ SOLR
50.0000 mg | Freq: Four times a day (QID) | INTRAMUSCULAR | Status: DC
  Administered 2017-01-16 – 2017-01-20 (×16): 50 mg via INTRAVENOUS
  Filled 2017-01-16 (×16): qty 2

## 2017-01-16 NOTE — Progress Notes (Signed)
[facility] [place] NAME: Sonya Park    MR#:  [id number]  DATE OF BIRTH:  Feb 11, 1941  SUBJECTIVE:   Patient here after a mechanical fall with significant bruising on her face. Also noted to be in severe shock with lactic acidosis suspected to be septic shock. Urine and BC + for E.coli and remains on 3 vasopressors and critically ill. Due to resp. Distress yesterday pt. Was intubated.    REVIEW OF SYSTEMS:    Review of Systems  Unable to perform ROS: Mental acuity    Nutrition: tube feeds Tolerating Diet: Yes Tolerating PT: Await Eval once extubated.   DRUG ALLERGIES:   Allergies  Allergen Reactions  . Other Itching    "mycin" doesn't know which one  . Penicillins Swelling    Has patient had a PCN reaction causing immediate rash, facial/tongue/throat swelling, SOB or lightheadedness with hypotension: Yes Has patient had a PCN reaction causing severe rash involving mucus membranes or skin necrosis: No Has patient had a PCN reaction that required hospitalization: No Has patient had a PCN reaction occurring within the last 10 years: No If all of the above answers are "NO", then may proceed with Cephalosporin use.    VITALS:  Blood pressure (!) 149/136, pulse 95, temperature 99.9 F (37.7 C), resp. rate (!) 21, height 5\' 6"  (1.676 m), weight 71 kg (156 lb 8.4 oz), SpO2 100 %.  PHYSICAL EXAMINATION:   Physical Exam  GENERAL:  76 y.o.-year-old patient lying in bed sedated & intubated.   EYES: Pupils equal, round, reactive to light. No scleral icterus. Extraocular muscles intact. Significant bruising and periorbital edema with bruising from her recent fall. HEENT: Significant bruising around the face and periorbital area.,normocephalic. ET and OG tubes in place.  NECK:  Supple, no jugular venous distention. No thyroid enlargement, no tenderness.  LUNGS: Normal breath sounds bilaterally, no wheezing, rales, rhonchi. No use of accessory  muscles of respiration.  CARDIOVASCULAR: S1, S2 normal. No murmurs, rubs, or gallops.  ABDOMEN: Soft, nontender, nondistended. Bowel sounds present. No organomegaly or mass.  EXTREMITIES: No clubbing, +2 edema b/l.  Cyanotic appearing Toes on the left foot.   NEUROLOGIC: Sedated & intubated.  PSYCHIATRIC: Sedated & intubated.  SKIN: No obvious rash, lesion, or ulcer.    LABORATORY PANEL:   CBC  Recent Labs Lab 01/16/17 0421  WBC 7.6  HGB 10.6*  HCT 31.4*  PLT 54*   ------------------------------------------------------------------------------------------------------------------  Chemistries   Recent Labs Lab 01/15/17 1821 01/16/17 0421 01/16/17 1105  NA 136 139 139  K 3.9 3.9 3.8  CL 108 103 98*  CO2 12* 20* 24  GLUCOSE 120* 62* 126*  BUN 12 13 13   CREATININE 1.19* 1.01* 0.99  CALCIUM 5.9* 6.0* 6.0*  MG 2.3  --   --   AST  --  182*  --   ALT  --  50  --   ALKPHOS  --  89  --   BILITOT  --  3.3*  --    ------------------------------------------------------------------------------------------------------------------  Cardiac Enzymes  Recent Labs Lab 01/14/17 2008  TROPONINI <0.03   ------------------------------------------------------------------------------------------------------------------  RADIOLOGY:  Ct Chest W Contrast  Result Date: 01/14/2017 CLINICAL DATA:  Fall. EXAM: CT CHEST, ABDOMEN, AND PELVIS WITH CONTRAST TECHNIQUE: Multidetector CT imaging of the chest, abdomen and pelvis was performed following the standard protocol during bolus administration of intravenous contrast. CONTRAST:  157mL ISOVUE-300 IOPAMIDOL (ISOVUE-300) INJECTION 61% COMPARISON:  01/17/2011 FINDINGS: CT CHEST FINDINGS Cardiovascular:  Aortic atherosclerosis. Calcification in the LAD coronary artery noted. Normal heart size. No pericardial effusion. Mediastinum/Nodes: No enlarged mediastinal, hilar, or axillary lymph nodes. Thyroid gland, trachea, and esophagus demonstrate no  significant findings. Lungs/Pleura: No pleural effusion identified. No pulmonary contusion or pneumothorax. 4 mm right middle lobe pulmonary nodule identified, image 85 of series 4. Left lower lobe nodule measures 3 mm, image 104 of series 4. Musculoskeletal: Spondylosis noted within the thoracic spine. Inferior endplate deformity involving the T12 vertebra is age indeterminate. No displaced rib fractures identified. CT ABDOMEN PELVIS FINDINGS Hepatobiliary: Marked diffuse hepatic steatosis. No focal liver abnormality. The gallbladder appears normal. No biliary dilatation. Pancreas: Unremarkable. No pancreatic ductal dilatation or surrounding inflammatory changes. Spleen: Spleen is unremarkable. Adrenals/Urinary Tract: No adrenal hemorrhage or renal injury identified. Bladder is unremarkable. 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 No pathologic dilatation of the large or small bowel loops. Vascular/Lymphatic: Aortic atherosclerosis. No aneurysm. No upper abdominal adenopathy. No pelvic or inguinal adenopathy. Reproductive: Status post hysterectomy. No adnexal masses. Other: No scratch set trace free fluid within the pelvis. No focal fluid collections. Musculoskeletal: Healed fracture deformity involving the right inferior pubic rami noted. No acute pelvic fracture noted. Chronic progressive superior endplate deformity involving the L3 vertebra. IMPRESSION: 1. Age-indeterminate T12 inferior endplate compression deformity. Progressive chronic inferior endplate deformity involves the L3 vertebra. 2. No evidence for solid organ or hollow viscus injury. 3. Small pulmonary nodules measure up to 4 mm. Nonspecific. No follow-up needed if patient is low-risk (and has no known or suspected primary neoplasm). Non-contrast chest CT can be considered in 12 months if patient is high-risk. This recommendation follows the consensus statement: Guidelines for Management of Incidental Pulmonary Nodules  Detected on CT Images: From the Fleischner Society 2017; Radiology 2017; 284:228-243. 4. Aortic Atherosclerosis (ICD10-I70.0). Lad coronary artery calcification noted. 5. Hepatic steatosis. Electronically Signed   By: Kerby Moors M.D.   On: 01/14/2017 21:54   Ct Abdomen Pelvis W Contrast  Result Date: 01/14/2017 CLINICAL DATA:  Fall. EXAM: CT CHEST, ABDOMEN, AND PELVIS WITH CONTRAST TECHNIQUE: Multidetector CT imaging of the chest, abdomen and pelvis was performed following the standard protocol during bolus administration of intravenous contrast. CONTRAST:  157mL ISOVUE-300 IOPAMIDOL (ISOVUE-300) INJECTION 61% COMPARISON:  01/17/2011 FINDINGS: CT CHEST FINDINGS Cardiovascular: Aortic atherosclerosis. Calcification in the LAD coronary artery noted. Normal heart size. No pericardial effusion. Mediastinum/Nodes: No enlarged mediastinal, hilar, or axillary lymph nodes. Thyroid gland, trachea, and esophagus demonstrate no significant findings. Lungs/Pleura: No pleural effusion identified. No pulmonary contusion or pneumothorax. 4 mm right middle lobe pulmonary nodule identified, image 85 of series 4. Left lower lobe nodule measures 3 mm, image 104 of series 4. Musculoskeletal: Spondylosis noted within the thoracic spine. Inferior endplate deformity involving the T12 vertebra is age indeterminate. No displaced rib fractures identified. CT ABDOMEN PELVIS FINDINGS Hepatobiliary: Marked diffuse hepatic steatosis. No focal liver abnormality. The gallbladder appears normal. No biliary dilatation. Pancreas: Unremarkable. No pancreatic ductal dilatation or surrounding inflammatory changes. Spleen: Spleen is unremarkable. Adrenals/Urinary Tract: No adrenal hemorrhage or renal injury identified. Bladder is unremarkable. 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 No pathologic dilatation of the large or small bowel loops. Vascular/Lymphatic: Aortic atherosclerosis. No aneurysm. No upper abdominal  adenopathy. No pelvic or inguinal adenopathy. Reproductive: Status post hysterectomy. No adnexal masses. Other: No scratch set trace free fluid within the pelvis. No focal fluid collections. Musculoskeletal: Healed fracture deformity involving the right inferior pubic rami noted. No acute pelvic fracture noted. Chronic  progressive superior endplate deformity involving the L3 vertebra. IMPRESSION: 1. Age-indeterminate T12 inferior endplate compression deformity. Progressive chronic inferior endplate deformity involves the L3 vertebra. 2. No evidence for solid organ or hollow viscus injury. 3. Small pulmonary nodules measure up to 4 mm. Nonspecific. No follow-up needed if patient is low-risk (and has no known or suspected primary neoplasm). Non-contrast chest CT can be considered in 12 months if patient is high-risk. This recommendation follows the consensus statement: Guidelines for Management of Incidental Pulmonary Nodules Detected on CT Images: From the Fleischner Society 2017; Radiology 2017; 284:228-243. 4. Aortic Atherosclerosis (ICD10-I70.0). Lad coronary artery calcification noted. 5. Hepatic steatosis. Electronically Signed   By: Kerby Moors M.D.   On: 01/14/2017 21:54   US Venous Img Lower Unilateral Left  Result Date: 01/15/2017 CLINICAL DATA:  Lower extremity edema EXAM: LEFT LOWER EXTREMITY VENOUS DOPPLER ULTRASOUND TECHNIQUE: Gray-scale sonography with graded compression, as well as color Doppler and duplex ultrasound were performed to evaluate the lower extremity deep venous systems from the level of the common femoral vein and including the common femoral, femoral, profunda femoral, popliteal and calf veins including the posterior tibial, peroneal and gastrocnemius veins when visible. The superficial great saphenous vein was also interrogated. Spectral Doppler was utilized to evaluate flow at rest and with distal augmentation maneuvers in the common femoral, femoral and popliteal veins.  COMPARISON:  11/02/2009 FINDINGS: Contralateral Common Femoral Vein: Respiratory phasicity is normal and symmetric with the symptomatic side. No evidence of thrombus. Normal compressibility. Common Femoral Vein: No evidence of thrombus. Normal compressibility, respiratory phasicity and response to augmentation. Saphenofemoral Junction: No evidence of thrombus. Normal compressibility and flow on color Doppler imaging. Profunda Femoral Vein: No evidence of thrombus. Normal compressibility and flow on color Doppler imaging. Femoral Vein: No evidence of thrombus. Normal compressibility, respiratory phasicity and response to augmentation. Popliteal Vein: No evidence of thrombus. Normal compressibility, respiratory phasicity and response to augmentation. Calf Veins: No evidence of thrombus. Normal compressibility and flow on color Doppler imaging. Other Findings:  None. IMPRESSION: No evidence of DVT within the left lower extremity. Electronically Signed   By: Misty Stanley M.D.   On: 01/15/2017 09:32   Dg Chest Port 1 View  Result Date: 01/16/2017 CLINICAL DATA:  Respiratory failure. EXAM: PORTABLE CHEST 1 VIEW COMPARISON:  01/15/2017. FINDINGS: Endotracheal tube, NG tube, right IJ line stable position. Heart size stable. Persistent atelectasis/ consolidation left lower lobe. Persistent small left pleural effusion. No pneumothorax. Chest is unchanged. IMPRESSION: 1. Lines and tubes in stable position. 2. Persistent left lower lobe atelectasis and consolidation and small left pleural effusion. No interim change from prior exam. Electronically Signed   By: Marcello Moores  Register   On: 01/16/2017 06:30   Dg Chest Port 1 View  Result Date: 01/15/2017 CLINICAL DATA:  76 year old female with a history of intubation EXAM: PORTABLE CHEST 1 VIEW COMPARISON:  01/15/2017, CT 01/14/2017 FINDINGS: Cardiomediastinal silhouette unchanged in size and contour. Calcifications of the aortic arch. Interval placement of endotracheal tube  which terminates approximately 3 cm 2.8 cm above the carina. Unchanged right IJ central catheter which appears to terminate superior vena cava. No pneumothorax. Opacities at the bilateral lung bases with increasing opacity on the left. Partial obscuration left hemidiaphragm. IMPRESSION: Interval placement of endotracheal tube which terminates approximately 2.8 cm above the carina. Unchanged right IJ central catheter. Increasing opacity at the left base, potentially combination of pleural fluid and/or atelectasis/consolidation. Electronically Signed   By: Corrie Mckusick D.O.   On: 01/15/2017  19:50   Dg Chest Port 1 View  Result Date: 01/15/2017 CLINICAL DATA:  Central line placement.  Initial encounter. EXAM: PORTABLE CHEST 1 VIEW COMPARISON:  Chest radiograph performed 04/25/2014 FINDINGS: A right IJ line is noted ending about the mid SVC. The lungs are well-aerated and clear. There is no evidence of focal opacification, pleural effusion or pneumothorax. The cardiomediastinal silhouette is within normal limits. No acute osseous abnormalities are seen. IMPRESSION: Right IJ line noted ending about the mid SVC. Lungs clear bilaterally. Electronically Signed   By: Garald Balding M.D.   On: 01/15/2017 04:16   Dg Abd Portable 1v  Result Date: 01/15/2017 CLINICAL DATA:  Nasogastric tube placement.  Initial encounter. EXAM: PORTABLE ABDOMEN - 1 VIEW COMPARISON:  CT of the abdomen and pelvis from 01/14/2017 FINDINGS: The patient's enteric tube is noted ending overlying the body of the stomach. The visualized bowel gas pattern is unremarkable. Scattered air and stool filled loops of colon are seen; no abnormal dilatation of small bowel loops is seen to suggest small bowel obstruction. No free intra-abdominal air is identified, though evaluation for free air is limited on a single supine view. The visualized osseous structures are within normal limits; the sacroiliac joints are unremarkable in appearance. IMPRESSION:  Enteric tube noted ending overlying the body of the stomach. Electronically Signed   By: Garald Balding M.D.   On: 01/15/2017 21:59   Ct Maxillofacial Wo Contrast  Result Date: 01/14/2017 CLINICAL DATA:  Fall last night. Bruising and swelling to the face and chest. EXAM: CT MAXILLOFACIAL WITHOUT CONTRAST TECHNIQUE: Multidetector CT imaging of the maxillofacial structures was performed. Multiplanar CT image reconstructions were also generated. COMPARISON:  CT head without contrast from the same day. FINDINGS: Osseous: Bilateral nasal fractures are present. Hemorrhage in the left frontal sinus suggests an occult fracture. No discrete fracture is present. The visualized calvarium is intact. The mandible is intact and located. Advanced degenerative changes are noted within the TMJ, worse on the left. Orbits: Periorbital soft tissue swelling and hematoma is worse right than left. There is no underlying globe injury. Bilateral lens replacements are present. Sinuses: Minimal low-density fluid is present in the sphenoid sinuses bilaterally. Small polyps or mucous retention cysts are noted inferiorly in the maxillary sinuses. Hemorrhages noted in the left frontal sinus. The remaining paranasal sinuses are clear. The mastoid air cells are clear. Soft tissues: Focal high-density hemorrhage is noted over the bridge of the nose in the midline. Bilateral periorbital soft tissue swelling hematoma is present. There is soft tissue edema over the left side of the face. Calcifications in the subcutaneous tissues of face bilaterally may be related to prior trauma or injections. The airway is patent. The visualized soft tissues the neck are within normal limits. Limited intracranial: Within normal limits. IMPRESSION: 1. Bilateral nasal bone fractures without significant displacement. 2. Left frontal hemorrhage suggest an occult fracture. No discrete fracture is visualized. 3. Extensive bilateral periorbital soft swelling and  hematoma with inflammatory changes extending over the maxilla bilaterally, left greater than right. Electronically Signed   By: San Morelle M.D.   On: 01/14/2017 15:28     ASSESSMENT AND PLAN:   77 year old female with past medical history of back pain, osteoporosis, arthritis, alcohol abuse who presents to the hospital after a fall and noted to have significant facial and periorbital bruising along with nasal bone fractures and also noted to be in severe shock.  1. Shock-this is suspected to be septic shock. Patient presented with severe  hypotension, elevated lactic acid. The source of the sepsis remains unclear but likely GU/GI. Patient's blood cultures were positive for E. Coli and  Continue empiric meropenem - UA was (-), CT chest abdomen pelvis was negative for acute pathology. -Continue supportive care with IV fluids, IV vasopressors, IV abx.    2. Acute respiratory failure-patient was intubated due to worsening respiratory distress. -Continue vent support as per pulmonary. Patient is on 35% FiO2 presently. Wean off vent as per intensivist/pulmonary.  3. Alcohol abuse-patient is high risk for alcohol withdrawal. - currently sedated & intubated.   4. Altered mental status/encephalopathy-etiology unclear but likely secondary to severe hypotension and shock. CT head was negative for acute pathology. -Continue supportive care to treat underlying severe shock and follow mental status once extubated.   5. Pancytopenia-etiology unclear but suspected to be secondary to alcohol abuse with bone marrow suppression. Also could be secondary to underlying sepsis.  - WBC count improved.  Cont. Empiric Abx as mentioned above and follow counts.   6. Hypokalemia/hypomagnesemia-continue ICU replacement protocol.  - improved with supplementation.    All the records are reviewed and case discussed with Care Management/Social Worker. Management plans discussed with the patient, family and they  are in agreement.  CODE STATUS: Full code  DVT Prophylaxis: Hep SQ  TOTAL TIME TAKING CARE OF THIS PATIENT: 30 minutes.   POSSIBLE D/C unclear DEPENDING ON CLINICAL CONDITION and progress.   Henreitta Leber M.D on 01/16/2017 at 3:07 PM  Between 7am to 6pm - Pager - [phone]  After 6pm go to [email or web address] - Proofreader  [facility]  Office  [phone]  CC: Primary care physician; Cletis Athens, MD

## 2017-01-16 NOTE — Progress Notes (Signed)
Spoke with Dr. Mortimer Fries about elevated lactic acid and ABG results.  Order obtained to discontinue bicarb drip, give 2L bolus of normal saline and repeat lactic acid at 10:00pm tonight.

## 2017-01-16 NOTE — Progress Notes (Signed)
Patient hypoglycemic, blood glucose from peripheral stick 45.  Blood glucose 71 form central line.  Bilateral arms and hands edematous given in accurate readings. Dr. Mortimer Fries notified and order obtained for d51/2NS at 45ml/hr.

## 2017-01-16 NOTE — Progress Notes (Signed)
[facility] for electrolyte management   Pharmacy consulted for electrolyte management for 76 yo female admitted to the ICU with sepsis. Patient has history significant for alcohol abuse and is on CIWA. Patient currently NPO.   Plan:  Electrolytes are WNL. Will f/u AM labs.   Allergies  Allergen Reactions  . Other Itching    "mycin" doesn't know which one  . Penicillins Swelling    Has patient had a PCN reaction causing immediate rash, facial/tongue/throat swelling, SOB or lightheadedness with hypotension: Yes Has patient had a PCN reaction causing severe rash involving mucus membranes or skin necrosis: No Has patient had a PCN reaction that required hospitalization: No Has patient had a PCN reaction occurring within the last 10 years: No If all of the above answers are "NO", then may proceed with Cephalosporin use.    Patient Measurements: Height: 5\' 6"  (167.6 cm) Weight: 156 lb 8.4 oz (71 kg) IBW/kg (Calculated) : 59.3  Vital Signs: Temp: 99.5 F (37.5 C) (10/11 1600) Temp Source: Core (Comment) (10/11 0800) Pulse Rate: 95 (10/11 0900) Intake/Output from previous day: 10/10 0701 - 10/11 0700 In: 4776.1 [I.V.:4076.1; IV Piggyback:700] Out: 560 [Urine:560] Intake/Output from this shift: Total I/O In: 2617.7 [I.V.:2317.7; IV Piggyback:300] Out: 250 [Urine:250]  Labs:  Recent Labs  01/14/17 2008 01/15/17 0330 01/15/17 0634 01/15/17 1113  01/15/17 1821 01/16/17 0421 01/16/17 1105  WBC 1.9* 0.8*  --   --   --   --  7.6  --   HGB 10.8* 8.5*  --  9.4*  --   --  10.6*  --   HCT 31.8* 25.5*  --  28.3*  --   --  31.4*  --   PLT 148* 84*  --   --   --   --  54*  --   CREATININE 1.07* 1.22*  --  1.18*  < > 1.19* 1.01* 0.99  MG  --   --  1.4*  --   --  2.3  --   --   PHOS  --   --   --  2.9  --   --   --   --   ALBUMIN 2.5*  --   --  1.5*  --   --  1.7*  --   PROT 4.9*  --   --   --   --   --  3.7*  --   AST 137*  --   --   --   --    --  182*  --   ALT 61*  --   --   --   --   --  50  --   ALKPHOS 126  --   --   --   --   --  89  --   BILITOT 3.8*  --   --   --   --   --  3.3*  --   BILIDIR  --   --   --   --   --   --  2.1*  --   IBILI  --   --   --   --   --   --  1.2*  --   < > = values in this interval not displayed. Estimated Creatinine Clearance: 46 mL/min (by C-G formula based on SCr of 0.99 mg/dL).    Medical History: Past Medical History:  Diagnosis Date  . Arthritis   . Back pain   .  Insomnia   . Medical history non-contributory   . Osteoporosis     Pharmacy will continue to monitor and adjust per consult.   Ulice Dash D 01/16/2017,6:01 PM

## 2017-01-16 NOTE — Progress Notes (Signed)
Pharmacy Antibiotic Note  Sonya Park is a 76 y.o. female admitted on 01/14/2017 with sepsis.  Pharmacy has been consulted for meropenem dosing.  Plan: Continue meropenem 1 g IV Q 12 H pending susceptibility information for e coli bacteremia. Discontinue fluconazole due to low suspicion for fungal infection.  Height: 5\' 6"  (167.6 cm) Weight: 156 lb 8.4 oz (71 kg) IBW/kg (Calculated) : 59.3  Temp (24hrs), Avg:98.2 F (36.8 C), Min:96.8 F (36 C), Max:99.3 F (37.4 C)   Recent Labs Lab 01/14/17 2008 01/14/17 2308 01/15/17 0330 01/15/17 1113 01/15/17 1521 01/15/17 1821 01/16/17 0421  WBC 1.9*  --  0.8*  --   --   --  7.6  CREATININE 1.07*  --  1.22* 1.18* 1.17* 1.19* 1.01*  LATICACIDVEN  --  6.7* 13.0*  --   --   --   --     Estimated Creatinine Clearance: 45.1 mL/min (A) (by C-G formula based on SCr of 1.01 mg/dL (H)).    Allergies  Allergen Reactions  . Other Itching    "mycin" doesn't know which one  . Penicillins Swelling    Has patient had a PCN reaction causing immediate rash, facial/tongue/throat swelling, SOB or lightheadedness with hypotension: Yes Has patient had a PCN reaction causing severe rash involving mucus membranes or skin necrosis: No Has patient had a PCN reaction that required hospitalization: No Has patient had a PCN reaction occurring within the last 10 years: No If all of the above answers are "NO", then may proceed with Cephalosporin use.    Antimicrobials this admission:  Levofloxacin 10/9 >> 10/10 Vancomycin 10/9 >> 10/10 Fluconazole 10/10 >> 10/10 Meropenem 10/9 >>   Dose adjustments this admission:   Microbiology results: 10/9 BCx: gram negative rods 10/9 BCx: e coli 10/9 BCID: e coli 10/9 UCx: <10,000 colonies/ml insignificant growth 10/10 Fungus Cx: sent  Thank you for allowing pharmacy to be a part of this patient's care.  Oralia Manis 01/16/2017 10:38 AM

## 2017-01-16 NOTE — Progress Notes (Signed)
PULMONARY / CRITICAL CARE MEDICINE   Name: Sonya Park MRN: [id number] DOB: 31-Mar-1941    HISTORY OF PRESENT ILLNESS:   This is a 76 yo female with a PMH of Osteoporosis, Insomnia, Back Pain, Arthritis, Bilateral Knee Replacements on aspirin every other day, and ETOH abuse.  She presented to [facility] ER 10/9 following a fall at home around 11:30 pm on the night of 10/8. Per ER notes the pt was going to the bathroom and fell down about 3 stairs face first.  The pts husband stated she could have been in the floor for about an hour before he discovered her with blood underneath her.  Per ER notes the pt does not remember having any symptoms prior to the fall.  She went to her PCP on 10/9 and a CT Head revealed periorbital and frontal scalp soft tissue thickening and new hyperattenuating material in the left frontal hemorrhage concerning for occult fracture. Upon arrival to the ER lab results revealed wbc 1.9, hgb 10.8, platelets 148, lactic acid 6.7, and pt was hypotensive meeting sepsis criteria.  Therefore, she received 2L NS bolus and iv abx initiated.  She has been recently sleeping in an RV due to mold in her house.  She was subsequently admitted by hospitalist team for further workup and treatment PCCM consulted.    REVIEW OF SYSTEMS:   Unable to assess pt ventilated   SUBJECTIVE:  Pt remains intubated requiring multiple vasopressors due to hypotension   VITAL SIGNS: BP (!) 79/55   Pulse 78   Temp 98.4 F (36.9 C)   Resp 19   Ht _0  (1.676 m)   Wt 71 kg (156 lb 8.4 oz)   SpO2 96%   BMI 25.26 kg/m   HEMODYNAMICS:    VENTILATOR SETTINGS: Vent Mode: PRVC FiO2 (%):  [40 %] 40 % Set Rate:  [20 bmp] 20 bmp Vt Set:  [450 mL] 450 mL PEEP:  [5 cmH20] 5 cmH20  INTAKE / OUTPUT: I/O last 3 completed shifts: In: 4906.9 [I.V.:2473.6; IV Piggyback:2433.3] Out: 525 [Urine:525]  PHYSICAL EXAMINATION: General: acutely ill appearing Caucasian female, NAD  Neuro: sedated, follows commands,  PERRL  HEENT: diffuse ecchymosis of forehead, nasal bridge, and bilateral periorbital tissue  Cardiovascular: sinus tach, s1s2, no M/R/G Lungs: diminished throughout, even, non labored mechanically intubated  Abdomen: hypoactive BS x4, soft, non tender, non distended  Musculoskeletal: 2+ bilateral lower extremity edema, moves all extremities  Skin: abrasion left lower extremity with erythema, cool to touch and weeping, bilateral feet cyanotic   LABS:  BMET  Recent Labs Lab 01/15/17 1113 01/15/17 1521 01/15/17 1821  NA 135 135 136  K 3.9 3.9 3.9  CL 107 105 108  CO2 11* 13* 12*  BUN _1 CREATININE 1.18* 1.17* 1.19*  GLUCOSE 122* 87 120*    Electrolytes  Recent Labs Lab 01/15/17 0634 01/15/17 1113 01/15/17 1521 01/15/17 1821  CALCIUM  --  6.0* 6.3* 5.9*  MG 1.4*  --   --  2.3  PHOS  --  2.9  --   --     CBC  Recent Labs Lab 01/14/17 2008 01/15/17 0330 01/15/17 1113  WBC 1.9* 0.8*  --   HGB 10.8* 8.5* 9.4*  HCT 31.8* 25.5* 28.3*  PLT 148* 84*  --     Coag's  Recent Labs Lab 01/14/17 2008  INR 1.29    Sepsis Markers  Recent Labs Lab 01/14/17 2308 01/15/17 0330  LATICACIDVEN 6.7* 13.0*  PROCALCITON  --  4.64    ABG  Recent Labs Lab 01/15/17 1638 01/15/17 2228  PHART 7.25* 7.28*  PCO2ART 19* 26*  PO2ART 90 129*    Liver Enzymes  Recent Labs Lab 01/14/17 2008 01/15/17 1113  AST 137*  --   ALT 61*  --   ALKPHOS 126  --   BILITOT 3.8*  --   ALBUMIN 2.5* 1.5*    Cardiac Enzymes  Recent Labs Lab 01/14/17 2008  TROPONINI <0.03    Glucose  Recent Labs Lab 01/15/17 0818 01/15/17 1149 01/15/17 1603 01/15/17 1643 01/15/17 2006 01/15/17 2315  GLUCAP 152* 88 57* 68 113* 91    Imaging US Venous Img Lower Unilateral Left  Result Date: 01/15/2017 CLINICAL DATA:  Lower extremity edema EXAM: LEFT LOWER EXTREMITY VENOUS DOPPLER ULTRASOUND TECHNIQUE: Gray-scale sonography with graded compression, as well as color  Doppler and duplex ultrasound were performed to evaluate the lower extremity deep venous systems from the level of the common femoral vein and including the common femoral, femoral, profunda femoral, popliteal and calf veins including the posterior tibial, peroneal and gastrocnemius veins when visible. The superficial great saphenous vein was also interrogated. Spectral Doppler was utilized to evaluate flow at rest and with distal augmentation maneuvers in the common femoral, femoral and popliteal veins. COMPARISON:  11/02/2009 FINDINGS: Contralateral Common Femoral Vein: Respiratory phasicity is normal and symmetric with the symptomatic side. No evidence of thrombus. Normal compressibility. Common Femoral Vein: No evidence of thrombus. Normal compressibility, respiratory phasicity and response to augmentation. Saphenofemoral Junction: No evidence of thrombus. Normal compressibility and flow on color Doppler imaging. Profunda Femoral Vein: No evidence of thrombus. Normal compressibility and flow on color Doppler imaging. Femoral Vein: No evidence of thrombus. Normal compressibility, respiratory phasicity and response to augmentation. Popliteal Vein: No evidence of thrombus. Normal compressibility, respiratory phasicity and response to augmentation. Calf Veins: No evidence of thrombus. Normal compressibility and flow on color Doppler imaging. Other Findings:  None. IMPRESSION: No evidence of DVT within the left lower extremity. Electronically Signed   By: Misty Stanley M.D.   On: 01/15/2017 09:32   Dg Chest Port 1 View  Result Date: 01/15/2017 Wilhelmina Mcardle, MD     01/15/2017  6:35 PM RIGHT FEMORAL ARTERIAL CATHETER R fem catheter placed using Arrow kit and under sterile conditions for continuous monitoring of BP Merton Border, MD PCCM service Mobile [phone] Pager [phone] 01/15/2017 6:35 PM   Dg Chest Port 1 View  Result Date: 01/15/2017 CLINICAL DATA:  Central line placement.  Initial  encounter. EXAM: PORTABLE CHEST 1 VIEW COMPARISON:  Chest radiograph performed 04/25/2014 FINDINGS: A right IJ line is noted ending about the mid SVC. The lungs are well-aerated and clear. There is no evidence of focal opacification, pleural effusion or pneumothorax. The cardiomediastinal silhouette is within normal limits. No acute osseous abnormalities are seen. IMPRESSION: Right IJ line noted ending about the mid SVC. Lungs clear bilaterally. Electronically Signed   By: Garald Balding M.D.   On: 01/15/2017 04:16   Dg Abd Portable 1v  Result Date: 01/15/2017 CLINICAL DATA:  Nasogastric tube placement.  Initial encounter. EXAM: PORTABLE ABDOMEN - 1 VIEW COMPARISON:  CT of the abdomen and pelvis from 01/14/2017 FINDINGS: The patient's enteric tube is noted ending overlying the body of the stomach. The visualized bowel gas pattern is unremarkable. Scattered air and stool filled loops of colon are seen; no abnormal dilatation of small bowel loops is seen to suggest small bowel obstruction. No free intra-abdominal air is identified,  though evaluation for free air is limited on a single supine view. The visualized osseous structures are within normal limits; the sacroiliac joints are unremarkable in appearance. IMPRESSION: Enteric tube noted ending overlying the body of the stomach. Electronically Signed   By: Garald Balding M.D.   On: 01/15/2017 21:59   STUDIES:  CT Abd, Pelvis, and Chest 10/9>>Age-indeterminate T12 inferior endplate compression deformity. Progressive chronic inferior endplate deformity involves the L3 vertebra. No evidence for solid organ or hollow viscus injury. Small pulmonary nodules measure up to 4 mm. Nonspecific. No follow-up needed if patient is low-risk (and has no known or suspected primary neoplasm). Non-contrast chest CT can be considered in 12 months if patient is high-risk.  Aortic Atherosclerosis (ICD10-I70.0). Lad coronary artery calcification noted. Hepatic steatosis. CT  Maxillofacial 10/9>>Bilateral nasal bone fractures without significant displacement. Left frontal hemorrhage suggest an occult fracture. No discrete fracture is visualized. Extensive bilateral periorbital soft swelling and hematoma with inflammatory changes extending over the maxilla bilaterally, left greater than right. CT Head 10/9>>Periorbital and frontal scalp soft tissue thickening, without acute intracranial abnormality. New hyperattenuating material in the left frontal sinus could represent hemorrhage. No acute fracture is seen. If this is a clinical concern, consider dedicated face CT. Osseous irregularity about both nasal bones is felt to be grossly similar back to 10/16/2012. Correlate with clinical exam to exclude acute superimposed injury. Cerebral atrophy and small vessel ischemic change  CULTURES: Blood 10/9>>enterobacteriaceae and ecoli  Urine 10/9>> Fungus 10/10>>  ANTIBIOTICS: Vancomycin 10/9 x1 dose  Levaquin 10/9 x1 dose  Aztreonam 10/9>>10/10 Meropenem 10/9>> Diflucan 10/10>>  SIGNIFICANT EVENTS: 10/9-Pt admitted to ICU  10/10-Pt mechanically intubated due to severe metabolic acidosis and encephalopathy   LINES/TUBES: Right IJ 10/9>> ETT 10/10>> Right femoral a line 10/10>>  ASSESSMENT / PLAN:  PULMONARY A: Mechanical Intubation due to metabolic acidosis and encephalopathy  P:   Full vent support wean as tolerated SBT once all parameters met VAP bundle Prn CXR Repeat ABG   CARDIOVASCULAR A:  Severe septic shock with hypotension  P:  Continuous telemetry monitoring Continue vasopressin, neo-synephrine, and levophed gtts to maintain map >65  RENAL A:   Metabolic Acidosis  Hypocalcemia  Mild Rhabdomyolysis  Lactic Acidosis  P:   Trend BMP Replace electrolytes as indicated  Monitor UOP Continue sodium bicarb gtt   GASTROINTESTINAL A:   Transaminitis Hyperbilirubinemia P:   Trend hepatic panel  OG tube to LIS  Keep NPO for now  Protonix  for SUP   HEMATOLOGIC A: Left frontal hemorrhage concerning for occult fracture and extensive bilateral periorbital soft swelling and hematoma s/p fall  Anemia with acute blood loss-improving   Thrombocytopenia  Neutropenia  P:  Trend CBC  SCD's for VTE prophylaxis, avoid chemical prophylaxis for now  Monitor for s/sx of bleeding  Transfuse for hgb <7 Continue neutropenic precautions   INFECTIOUS A:   Bacteremia-blood cultures positive enterobacteriaceae and ecoli 10/9 Suspected left lower extremity cellulitis Recent exposure to mold  P:   Trend WBC and monitor fever curve Trend PCT Follow cultures Continue abx as listed above   ENDOCRINE A:   No acute issues  P:   CBG's q4hrs Will add SSI if cbg's >180   NEUROLOGIC A:   Metabolic encephalopathy  ETOH abuse ICU/ Mechanical Ventilation Discomfort  P:   RASS goal: -1 to -2 Fentanyl gtt for pain management and to maintain RASS goal  Continue MVI gtt  CIWA protocol   FAMILY  - Updates: No family currently  at bedside to update   - Inter-disciplinary family meet or Palliative Care meeting due by: 01/21/2017    [name], [place] Pager [phone] (please enter 7 digits) [place] Pager [phone] (please enter 7 digits)

## 2017-01-16 NOTE — Progress Notes (Signed)
Initial Nutrition Assessment  DOCUMENTATION CODES:   Not applicable; Patient is at risk for malnutrition  INTERVENTION:  Plan is to hold off on tube feeds at this time.  Once patient is hemodynamically stable, recommend initiating Vital AF 1.2 at 20 ml/hr and advancing by 15 ml/hr every 8 hours to goal regimen of Vital AF 1.2 at 50 ml/hr via OGT. Provides 1440 kcal, 90 grams of protein, 972 ml H2O daily.  Patient is at risk for refeeding syndrome. Recommend monitoring potassium, phosphorus, and magnesium closely for 3 days following initiation of nutrition support.  On recent follow-up with Dr. Duke Salvia, patient was found to be deficient in copper, zinc, vitamin K, and vitamin A. -Recommend 16 mg elemental zinc PO for 6 months (200% RDI), 2 mg/day IV copper for 6 days (followed by 1 mg PO daily until zinc supplementation is complete), 10000-25000 IU/day vitamin A orally/per tube for 1-2 weeks. -Will defer repletion of vitamin K to medical team in setting of facial trauma and left frontal hemorrhage. Per 2016 ASMBS guidelines, the recommendation is 1-2 mg/day orally/per tube or 1-2 mg/week parenterally. -Patient will need close monitoring of nutrition-related labs every 3 months.  NUTRITION DIAGNOSIS:   Inadequate oral intake related to inability to eat as evidenced by NPO status.  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 Intubated on 10/10.   No family at bedside during assessment. Noted in chart that patient has recently been following with Dr. Duke Salvia closely as she had weight loss after an orthopedic surgery related to anorexia. She responded to Megace and began gaining some weight back and appetite had improved. On the same visit patient was found to be deficient in copper (36 ug/dL; Ref: 72-166 ug/dL), zinc (39 ug/dL; Ref: 56-134 ug/dL), vitamin K (<0.13 ng/mL; Ref: 0.13-1.88 ng/mL), vitamin A (9.8 ug/dL; Ref: 36.4-108 ug/dL). Thiamine and folate were WNL and vitamin B12 was elevated. Patient was started on calcium citrate TID and vitamin D daily. Last reliable weight in chart was 135.8 lbs (61.7 kg) on 12/31/2016. Will use this weight to estimate needs as current weight likely falsely elevated in setting of edema. One year prior patient was 149 lbs. That is weight loss of 13.2 lbs (8.9% body weight) over one year, which is not significant for time frame. On 09/18/2014 patient was 161.5 lbs.  Access: 14 Fr. OGT placed 10/10; verified to terminate in stomach per abdominal x-ray 10/10; 55 cm at corner of mouth; currently to LIS but no documented output  MAP: no recent MAP; 59 to 165 earlier this AM  Patient is currently intubated on ventilator support MV: 9 L/min Temp (24hrs), Avg:98.4 F (36.9 C), Min:97.2 F (36.2 C), Max:99.9 F (37.7 C)  Propofol: N/A  Medications reviewed and include: hydrocortisone sodium succinate 50 mg Q6hrs IV, pantoprazole, fentanyl gtt, meropenem, Levophed gtt 23.4 ml/hr, phenylephrine gtt 28.1 ml/hr, sodium bicarbonate in sterile  water @ 125 ml/hr, vasopressin gtt 11.3 ml/hr.  Labs reviewed: CBG 54-103, Chloride 98.  Limited Nutrition-Focused physical exam completed. Findings  no muscle depletion found in temple region, clavicle bone region, or clavicle/acromion bone region, and moderate  pitting edema to bilateral upper and lower extremities. Unable to assess subcutaneous fat well in setting of edema. Abdomen soft on exam. Noted weeping on extremities. Bruising on face.  Patient is at risk of malnutrition in setting of history of Roux-en-Y gastric bypass with recent weight loss in setting of anorexia (absence of hunger) and also due to hx of EtOH abuse.  Discussed with RN. Patient also discussed on rounds. Plan is to hold off on feeding at this time.  Diet Order:     Skin:  Wound (see comment) (cellulitis to legs, weeping to arms and legs)  Last BM:  Unknown  Height:   Ht Readings from Last 1 Encounters:  01/14/17 5\' 6"  (1.676 m)    Weight:   Wt Readings from Last 1 Encounters:  01/15/17 156 lb 8.4 oz (71 kg)    Ideal Body Weight:     BMI:  Body mass index is 25.26 kg/m.  Estimated Nutritional Needs:   Kcal:  1401 (PSU 2003b w/ MSJ 1134, Ve 9, Tmax 37.4)  Protein:  85-100 grams (1.4-1.6 grams/kg)  Fluid:  1.5-1.8 L/day (25-30 ml/kg)  EDUCATION NEEDS:   Education needs no appropriate at this time  Willey Blade, [place], [place], [place] Office: [phone] Pager: [phone] After Hours/Weekend Pager: [phone]

## 2017-01-17 ENCOUNTER — Inpatient Hospital Stay: Payer: Medicare Other

## 2017-01-17 ENCOUNTER — Encounter: Payer: Self-pay | Admitting: *Deleted

## 2017-01-17 DIAGNOSIS — R7881 Bacteremia: Secondary | ICD-10-CM

## 2017-01-17 DIAGNOSIS — J9600 Acute respiratory failure, unspecified whether with hypoxia or hypercapnia: Secondary | ICD-10-CM

## 2017-01-17 DIAGNOSIS — Z515 Encounter for palliative care: Secondary | ICD-10-CM

## 2017-01-17 DIAGNOSIS — Z7189 Other specified counseling: Secondary | ICD-10-CM

## 2017-01-17 DIAGNOSIS — A4151 Sepsis due to Escherichia coli [E. coli]: Principal | ICD-10-CM

## 2017-01-17 LAB — CBC WITH DIFFERENTIAL/PLATELET
BASOS ABS: 0 10*3/uL (ref 0–0.1)
BASOS PCT: 0 %
Band Neutrophils: 7 %
Basophils Absolute: 0 10*3/uL (ref 0–0.1)
Basophils Relative: 0 %
Blasts: 0 %
EOS ABS: 0.1 10*3/uL (ref 0–0.7)
EOS PCT: 1 %
Eosinophils Absolute: 0 10*3/uL (ref 0–0.7)
Eosinophils Relative: 0 %
HCT: 31.3 % — ABNORMAL LOW (ref 35.0–47.0)
HEMATOCRIT: 30.4 % — AB (ref 35.0–47.0)
HEMOGLOBIN: 10.4 g/dL — AB (ref 12.0–16.0)
Hemoglobin: 10.6 g/dL — ABNORMAL LOW (ref 12.0–16.0)
Lymphocytes Relative: 4 %
Lymphocytes Relative: 4 %
Lymphs Abs: 0.6 10*3/uL — ABNORMAL LOW (ref 1.0–3.6)
Lymphs Abs: 0.6 10*3/uL — ABNORMAL LOW (ref 1.0–3.6)
MCH: 36.3 pg — AB (ref 26.0–34.0)
MCH: 36 pg — ABNORMAL HIGH (ref 26.0–34.0)
MCHC: 33.8 g/dL (ref 32.0–36.0)
MCHC: 34.1 g/dL (ref 32.0–36.0)
MCV: 107.3 fL — ABNORMAL HIGH (ref 80.0–100.0)
MCV: 105.6 fL — ABNORMAL HIGH (ref 80.0–100.0)
METAMYELOCYTES PCT: 1 %
MONO ABS: 0.4 10*3/uL (ref 0.2–0.9)
MYELOCYTES: 1 %
Monocytes Absolute: 0.6 10*3/uL (ref 0.2–0.9)
Monocytes Relative: 3 %
Monocytes Relative: 4 %
NEUTROS ABS: 13 10*3/uL — AB (ref 1.4–6.5)
Neutro Abs: 13 10*3/uL — ABNORMAL HIGH (ref 1.4–6.5)
Neutrophils Relative %: 92 %
Neutrophils Relative %: 83 %
Other: 0 %
PROMYELOCYTES ABS: 0 %
Platelets: 20 10*3/uL — CL (ref 150–440)
Platelets: 16 10*3/uL — CL (ref 150–440)
RBC: 2.92 MIL/uL — ABNORMAL LOW (ref 3.80–5.20)
RBC: 2.88 MIL/uL — AB (ref 3.80–5.20)
RDW: 17.2 % — AB (ref 11.5–14.5)
RDW: 17.5 % — AB (ref 11.5–14.5)
WBC: 14.1 10*3/uL — ABNORMAL HIGH (ref 3.6–11.0)
WBC: 14.2 10*3/uL — AB (ref 3.6–11.0)
nRBC: 0 /100 WBC

## 2017-01-17 LAB — CULTURE, BLOOD (ROUTINE X 2)

## 2017-01-17 LAB — BASIC METABOLIC PANEL
ANION GAP: 10 (ref 5–15)
Anion gap: 11 (ref 5–15)
BUN: 14 mg/dL (ref 6–20)
BUN: 15 mg/dL (ref 6–20)
CALCIUM: 6.3 mg/dL — AB (ref 8.9–10.3)
CHLORIDE: 104 mmol/L (ref 101–111)
CHLORIDE: 102 mmol/L (ref 101–111)
CO2: 23 mmol/L (ref 22–32)
CO2: 24 mmol/L (ref 22–32)
CREATININE: 0.77 mg/dL (ref 0.44–1.00)
Calcium: 5.4 mg/dL — CL (ref 8.9–10.3)
Creatinine, Ser: 0.73 mg/dL (ref 0.44–1.00)
GFR calc Af Amer: >60 mL/min (ref >60)
GFR calc non Af Amer: >60 mL/min (ref >60)
GFR calc non Af Amer: >60 mL/min (ref >60)
GLUCOSE: 117 mg/dL — AB (ref 65–99)
Glucose, Bld: 124 mg/dL — ABNORMAL HIGH (ref 65–99)
Potassium: 3.7 mmol/L (ref 3.5–5.1)
Potassium: 3.3 mmol/L — ABNORMAL LOW (ref 3.5–5.1)
SODIUM: 138 mmol/L (ref 135–145)
Sodium: 136 mmol/L (ref 135–145)

## 2017-01-17 LAB — PHOSPHORUS: Phosphorus: 2.1 mg/dL — ABNORMAL LOW (ref 2.5–4.6)

## 2017-01-17 LAB — AMMONIA: AMMONIA: 52 umol/L — AB (ref 9–35)

## 2017-01-17 LAB — PROCALCITONIN: Procalcitonin: 5.36 ng/mL

## 2017-01-17 LAB — GLUCOSE, CAPILLARY
GLUCOSE-CAPILLARY: 135 mg/dL — AB (ref 65–99)
Glucose-Capillary: 90 mg/dL (ref 65–99)
Glucose-Capillary: 80 mg/dL (ref 65–99)
Glucose-Capillary: 76 mg/dL (ref 65–99)

## 2017-01-17 LAB — MAGNESIUM: MAGNESIUM: 1.8 mg/dL (ref 1.7–2.4)

## 2017-01-17 LAB — LACTIC ACID, PLASMA: LACTIC ACID, VENOUS: 6.7 mmol/L — AB (ref 0.5–1.9)

## 2017-01-17 MED ORDER — FOLIC ACID 5 MG/ML IJ SOLN
1.0000 mg | Freq: Every day | INTRAMUSCULAR | Status: DC
  Administered 2017-01-17 – 2017-01-19 (×3): 1 mg via INTRAVENOUS
  Filled 2017-01-17 (×6): qty 0.2

## 2017-01-17 MED ORDER — POTASSIUM & SODIUM PHOSPHATES 280-160-250 MG PO PACK
2.0000 | PACK | Freq: Once | ORAL | Status: AC
  Administered 2017-01-17: 2 via ORAL
  Filled 2017-01-17: qty 2

## 2017-01-17 MED ORDER — CIPROFLOXACIN IN D5W 400 MG/200ML IV SOLN
400.0000 mg | Freq: Two times a day (BID) | INTRAVENOUS | Status: AC
  Administered 2017-01-17 – 2017-01-23 (×14): 400 mg via INTRAVENOUS
  Filled 2017-01-17 (×15): qty 200

## 2017-01-17 MED ORDER — THIAMINE HCL 100 MG/ML IJ SOLN
100.0000 mg | Freq: Every day | INTRAMUSCULAR | Status: DC
  Administered 2017-01-17 – 2017-01-21 (×5): 100 mg via INTRAVENOUS
  Filled 2017-01-17 (×5): qty 2

## 2017-01-17 MED ORDER — LACTULOSE 10 GM/15ML PO SOLN
20.0000 g | Freq: Two times a day (BID) | ORAL | Status: DC
  Administered 2017-01-17 – 2017-01-20 (×7): 20 g via ORAL
  Filled 2017-01-17 (×8): qty 30

## 2017-01-17 MED ORDER — CALCIUM GLUCONATE 10 % IV SOLN
2.0000 g | Freq: Once | INTRAVENOUS | Status: AC
  Administered 2017-01-17: 2 g via INTRAVENOUS
  Filled 2017-01-17: qty 20

## 2017-01-17 MED ORDER — SODIUM CHLORIDE 0.9% FLUSH
10.0000 mL | Freq: Two times a day (BID) | INTRAVENOUS | Status: DC
  Administered 2017-01-17 – 2017-01-19 (×3): 10 mL
  Administered 2017-01-19: 40 mL
  Administered 2017-01-20 – 2017-01-22 (×4): 10 mL
  Administered 2017-01-22: 30 mL
  Administered 2017-01-23 – 2017-01-24 (×3): 10 mL

## 2017-01-17 MED ORDER — MAGNESIUM SULFATE 2 GM/50ML IV SOLN
2.0000 g | Freq: Once | INTRAVENOUS | Status: AC
  Administered 2017-01-17: 2 g via INTRAVENOUS
  Filled 2017-01-17: qty 50

## 2017-01-17 MED ORDER — SODIUM CHLORIDE 0.9% FLUSH: 10.0000 mL | INTRAVENOUS | Status: DC | PRN

## 2017-01-17 MED ORDER — KCL IN DEXTROSE-NACL 20-5-0.45 MEQ/L-%-% IV SOLN
INTRAVENOUS | Status: DC
  Administered 2017-01-17 – 2017-01-20 (×4): via INTRAVENOUS
  Filled 2017-01-17 (×6): qty 1000

## 2017-01-17 MED ORDER — CALCIUM GLUCONATE 10 % IV SOLN
1.0000 g | Freq: Once | INTRAVENOUS | Status: AC
  Administered 2017-01-17: 1 g via INTRAVENOUS
  Filled 2017-01-17: qty 10

## 2017-01-17 NOTE — Progress Notes (Signed)
Repeat lactic drawn, resulted at 9.7.  NP made aware.  No new orders at this time.

## 2017-01-17 NOTE — Progress Notes (Signed)
Sonya Park for electrolyte management   Pharmacy consulted for electrolyte management for 76 yo female admitted to the ICU with sepsis. Patient has history significant for alcohol abuse and is on CIWA. Patient currently NPO.   Plan:  Patient has received calcium gluconate 2g IV x 1 today. Will recheck electrolytes with am labs.    Allergies  Allergen Reactions  . Other Itching    "mycin" doesn't know which one  . Penicillins Swelling    Has patient had a PCN reaction causing immediate rash, facial/tongue/throat swelling, SOB or lightheadedness with hypotension: Yes Has patient had a PCN reaction causing severe rash involving mucus membranes or skin necrosis: No Has patient had a PCN reaction that required hospitalization: No Has patient had a PCN reaction occurring within the last 10 years: No If all of the above answers are "NO", then may proceed with Cephalosporin use.    Patient Measurements: Height: 5\' 6"  (167.6 cm) Weight: 156 lb 8.4 oz (71 kg) IBW/kg (Calculated) : 59.3  Vital Signs: Temp: 97.5 F (36.4 C) (10/12 1200) Temp Source: Core (Comment) (10/12 0700) Pulse Rate: 80 (10/12 1200) Intake/Output from previous day: 10/11 0701 - 10/12 0700 In: 3021.8 [I.V.:2721.8; IV Piggyback:300] Out: 625 [Urine:625] Intake/Output from this shift: No intake/output data recorded.  Labs:  Recent Labs  01/14/17 2008 01/15/17 0330 01/15/17 0634 01/15/17 1113  01/15/17 1821 01/16/17 0421 01/16/17 1105 01/17/17 0430 01/17/17 1225  WBC 1.9* 0.8*  --   --   --   --  7.6  --   --  14.1*  HGB 10.8* 8.5*  --  9.4*  --   --  10.6*  --   --  10.6*  HCT 31.8* 25.5*  --  28.3*  --   --  31.4*  --   --  31.3*  PLT 148* 84*  --   --   --   --  54*  --   --  20*  CREATININE 1.07* 1.22*  --  1.18*  < > 1.19* 1.01* 0.99 0.77  --   MG  --   --  1.4*  --   --  2.3  --   --  1.8  --   PHOS  --   --   --  2.9  --   --   --   --  2.1*  --   ALBUMIN  2.5*  --   --  1.5*  --   --  1.7*  --   --   --   PROT 4.9*  --   --   --   --   --  3.7*  --   --   --   AST 137*  --   --   --   --   --  182*  --   --   --   ALT 61*  --   --   --   --   --  50  --   --   --   ALKPHOS 126  --   --   --   --   --  89  --   --   --   BILITOT 3.8*  --   --   --   --   --  3.3*  --   --   --   BILIDIR  --   --   --   --   --   --  2.1*  --   --   --   IBILI  --   --   --   --   --   --  1.2*  --   --   --   < > = values in this interval not displayed. Estimated Creatinine Clearance: 56.9 mL/min (by C-G formula based on SCr of 0.77 mg/dL).    Medical History: Past Medical History:  Diagnosis Date  . Arthritis   . Back pain   . Insomnia   . Medical history non-contributory   . Osteoporosis     Pharmacy will continue to monitor and adjust per consult.   Oralia Manis 01/17/2017,1:37 PM

## 2017-01-17 NOTE — Consult Note (Signed)
Consultation Note Date: 01/17/2017   Patient Name: Sonya Park  DOB: 05-03-1940  MRN: [id number]  Age / Sex: 76 y.o., female  PCP: Cletis Athens, MD Referring Physician: Henreitta Leber, MD  Reason for Consultation: Establishing goals of care  HPI/Patient Profile: 76 y.o. female  with past medical history of osteoporosis, arthritis, insomnia, back pain, bilateral knee replacements, gastric bypass, and ETOH abuse admitted on 01/14/2017 after fall on 01/13/17. She went to PCP on 10/9 and CT maxillofacial revealed bilateral bone fractures without significant displacement, left frontal hemorrhage suggestive of occult fracture, and extensive bilateral periorbital soft swelling and hematoma with inflammatory changes over bilateral maxilla. In ED, lab results revealed WBC 1.9 and lactic acid of 6.7. Patient also hypotensive. Admitted to ICU for sepsis. On 10/10, patient intubated secondary to severe metabolic acidosis and encephalopathy. Continuous vasopressin, neo-synephrine, and levophed infusions to maintain map >65. Blood cultures positive for enterobacteriaceae species and ecoli. Receiving antibiotics. Remains in critical condition due to septic shock. Palliative medicine consultation for goals of care.   Clinical Assessment and Goals of Care: I have reviewed medical records, discussed with Hinton Dyer, PCCM NP, and met with husband [name]) in family waiting room to discuss diagnosis, prognosis, GOC, EOL wishes, disposition and options. Introduced palliative medicine.   [name] shares an extensive life review of the patient. Prior to hospitalization, living with husband in RV due current repair for mold in their home. Married to Parker for 29 years. Retired from [employer]. He speaks of [name] having increased weakness in the last few weeks before falls/hospitalization. He tells me she was recently started on appetite  stimulant due to weight loss and poor appetite. He tells me she is an alcoholic and begins to drink wine every day around 12 or 1pm. He feels she would drink too much wine that her stomach would be full and contribute to her poor appetite. She has been falling at home. Does not use a cane or walker.   Discussed in detail hospital diagnoses and interventions. [name] understands she is critically ill due to septic shock secondary to infection. He shares stories of his time spent in the [place] during the [place] War. "I know what shock is" and becomes tearful recalling friends who died during war from shock.   Advanced directives, concepts specific to code status, and artifical feeding and hydration were discussed. [name] tells me she has a documented living will but unsure of the location. It sounds as if this is durable not HCPOA. Encouraged [name] to bring documentation to the hospital if he can find it. Hard Choices copy provided. I encouraged [name] to read and consider big decisions he is faced with if she does not improve--including resuscitation, continued life support, feeding tube. He corrects me and states "not if" but "when she gets better."  [name] asked me what another provider meant when he said "she will never be the same as before." I explained deconditioning and extensive therapy that will be necessary if she is able to wean from ventilator.  Also explained poor outcomes of CPR with age and critical condition.   [name] shares his strong Darrick Meigs faith and is hopeful that his prayers will be answered. He blames himself for allowing her to continue to drink and not pushing her to go see her PCP when she became weaker.  Therapeutic listening as [name] shared many memories of his wife. Emotional/spiritual support provided. Questions and concerns addressed.      SUMMARY OF RECOMMENDATIONS    FULL code/FULL scope  Watchful waiting. Husband hopeful for improvement but also aware of guarded  prognosis.   Hard Choices copy given. Encouraged him to read and consider big decisions he is faced with including code status/life-prolonging measures such as feeding tube.  PMT not at [facility] over the weekend but will continue to support patient/family through hospitalization.  Code Status/Advance Care Planning:  Full code-Educated on DNR  Symptom Management:   Per attending  Palliative Prophylaxis:   Aspiration, Delirium Protocol, Oral Care and Turn Reposition  Additional Recommendations (Limitations, Scope, Preferences):  Full Scope Treatment  Psycho-social/Spiritual:   Desire for further Chaplaincy support: yes  Additional Recommendations: Caregiving  Support/Resources and Compassionate Wean Education  Prognosis:   Unable to determine: guarded with septic shock secondary to bactermia  Discharge Planning: To Be Determined      Primary Diagnoses: Present on Admission: . Sepsis ([place]) . Facial trauma . Osteoporosis . Neutropenia ([place]) . Alcohol abuse   I have reviewed the medical record, interviewed the patient and family, and examined the patient. The following aspects are pertinent.  Past Medical History:  Diagnosis Date  . Arthritis   . Back pain   . Insomnia   . Medical history non-contributory   . Osteoporosis    Social History   Social History  . Marital status: Married    Spouse name: N/A  . Number of children: N/A  . Years of education: N/A   Social History Main Topics  . Smoking status: Former Smoker    Quit date: 11/30/1994  . Smokeless tobacco: Never Used  . Alcohol use 12.6 - 21.0 oz/week    21 - 35 Glasses of wine per week     Comment: 3-5 glasses of wine/ day  . Drug use: No  . Sexual activity: Not Asked   Other Topics Concern  . None   Social History Narrative  . None   Family History  Problem Relation Age of Onset  . Liver disease Mother   . Heart attack Father   . Cancer Father   . CAD Sister    Scheduled Meds: .  chlorhexidine gluconate (MEDLINE KIT)  15 mL Mouth Rinse BID  . EPINEPHrine  1 mg Intravenous Once  . folic acid  1 mg Intravenous Daily  . hydrocortisone sod succinate (SOLU-CORTEF) inj  50 mg Intravenous Q6H  . mouth rinse  15 mL Mouth Rinse 10 times per day  . pantoprazole (PROTONIX) IV  40 mg Intravenous Q24H  . rocuronium  1 mg/kg Intravenous Once  . thiamine injection  100 mg Intravenous Daily   Continuous Infusions: . ciprofloxacin Stopped (01/17/17 1313)  . dextrose 5 % and 0.45% NaCl 75 mL/hr (01/17/17 0830)  . fentaNYL infusion INTRAVENOUS 125 mcg/hr (01/17/17 1237)  . norepinephrine (LEVOPHED) Adult infusion 20 mcg/min (01/17/17 0057)  . phenylephrine (NEO-SYNEPHRINE) Adult infusion 30 mcg/min (01/17/17 1441)  . vasopressin (PITRESSIN) infusion - *FOR SHOCK* 0.03 Units/min (01/16/17 1758)   PRN Meds:.fentaNYL, hydrALAZINE, metoprolol tartrate, midazolam, ondansetron **OR** ondansetron (ZOFRAN) IV Medications Prior  to Admission:  Prior to Admission medications   Medication Sig Start Date End Date Taking? Authorizing Provider  aspirin 81 MG tablet Take 81 mg by mouth daily.   Yes [provider]  CALCIUM CARBONATE PO Take 1 tablet by mouth daily.    Yes [provider]  cholecalciferol (VITAMIN D) 1000 UNITS tablet Take 2,000 Units by mouth daily.    Yes [provider]  estrogens, conjugated, (PREMARIN) 0.625 MG tablet Take 0.625 mg by mouth daily. Take daily for 21 days then do not take for 7 days.   Yes [provider]  ibandronate (BONIVA) 150 MG tablet Take 150 mg by mouth every 30 (thirty) days. Take in the morning with a full glass of water, on an empty stomach, and do not take anything else by mouth or lie down for the next 30 min.   Yes [provider]  megestrol (MEGACE) 20 MG tablet Take 1 tablet by mouth daily. 12/30/16 01/29/17 Yes [provider]  Probiotic Product (DIGESTIVE ADVANTAGE GUMMIES) CHEW Chew 3 Doses by  mouth daily.    Yes [provider]  clindamycin (CLEOCIN) 150 MG capsule Take 600 mg by mouth daily as needed. Dental work    Secondary school teacher, Historical, MD  DiphenhydrAMINE HCl (ZZZQUIL) 50 MG/30ML LIQD Take 30 mLs by mouth at bedtime as needed (for sleep).    [provider]  oxyCODONE (OXY IR/ROXICODONE) 5 MG immediate release tablet Take 1-2 tablets (5-10 mg total) by mouth every 4 (four) hours as needed for moderate pain. Patient not taking: Reported on 01/14/2017 12/15/15   Lattie Corns, PA-C  traMADol (ULTRAM) 50 MG tablet Take 1-2 tablets (50-100 mg total) by mouth every 4 (four) hours as needed for moderate pain. Patient not taking: Reported on 01/14/2017 12/15/15   Lattie Corns, PA-C   Allergies  Allergen Reactions  . Other Itching    "mycin" doesn't know which one  . Penicillins Swelling    Has patient had a PCN reaction causing immediate rash, facial/tongue/throat swelling, SOB or lightheadedness with hypotension: Yes Has patient had a PCN reaction causing severe rash involving mucus membranes or skin necrosis: No Has patient had a PCN reaction that required hospitalization: No Has patient had a PCN reaction occurring within the last 10 years: No If all of the above answers are "NO", then may proceed with Cephalosporin use.   Review of Systems  Unable to perform ROS: Acuity of condition   Physical Exam  Constitutional: She appears ill. She is sedated and intubated.  Cardiovascular: Regular rhythm.   Pulmonary/Chest: She is intubated. She has decreased breath sounds.  Ventilator-FIO2 35%  Musculoskeletal: She exhibits edema (generalized).  Skin: Skin is warm and dry. Ecchymosis noted. There is pallor.  Nursing note and vitals reviewed.  Vital Signs: BP (!) 149/136   Pulse 80   Temp (!) 97.5 F (36.4 C)   Resp 20   Ht '5\' 6"'$  (1.676 m)   Wt 71 kg (156 lb 8.4 oz)   SpO2 100%   BMI 25.26 kg/m  Pain Assessment: CPOT POSS *See Group Information*:  2-Acceptable,Slightly drowsy, easily aroused Pain Score: 7   SpO2: SpO2: 100 % O2 Device:SpO2: 100 % O2 Flow Rate: .   IO: Intake/output summary:   Intake/Output Summary (Last 24 hours) at 01/17/17 1555 Last data filed at 01/17/17 0437  Gross per 24 hour  Intake           804.13 ml  Output  375 ml  Net           429.13 ml   LBM:   Baseline Weight: Weight: 63 kg (139 lb) Most recent weight: Weight: 71 kg (156 lb 8.4 oz)     Palliative Assessment/Data: PPS 10%   Flowsheet Rows     Most Recent Value  Intake Tab  Referral Department  Critical care  Unit at Time of Referral  ICU  Palliative Care Primary Diagnosis  Sepsis/Infectious Disease  Palliative Care Type  New Palliative care  Reason for referral  Clarify Goals of Care  Date first seen by Palliative Care  01/17/17  Clinical Assessment  Palliative Performance Scale Score  10%  Psychosocial & Spiritual Assessment  Palliative Care Outcomes  Patient/Family meeting held?  Yes  Who was at the meeting?  husband  Palliative Care Outcomes  Clarified goals of care, Provided end of life care assistance, ACP counseling assistance, Provided psychosocial or spiritual support      Time In: 1415 Time Out: 1600 Time Total: 173mn Greater than 50%  of this time was spent counseling and coordinating care related to the above assessment and plan.  Signed by:  MIhor Dow FNP-C Palliative Medicine Team  Phone: [phone]: [phone]  Please contact Palliative Medicine Team phone at [phone] questions and concerns.  For individual provider: See AShea Evans

## 2017-01-17 NOTE — Progress Notes (Signed)
[place] for electrolyte management   Pharmacy consulted for electrolyte management for 76 yo female admitted to the ICU with sepsis. Patient has history significant for alcohol abuse and is on CIWA. Patient ordered D5-1/2NS @ 84mL/hr. Patient ordered Lactulose BID. Patient ordered calcium gluconate 2g IV x 1 early today.   Plan:  Patient ordered calcium gluconate 1g IV x 1, patient's corrected calcium is 8.1.   Will change MIVF to D5-1/2NS with 38mEq of potassium/L @ 75mL/hr. Will order magnesium 2g IV x 1. Will order Phosnak 2 packets x 1. Will recheck electrolytes with am labs.    Allergies  Allergen Reactions  . Other Itching    "mycin" doesn't know which one  . Penicillins Swelling    Has patient had a PCN reaction causing immediate rash, facial/tongue/throat swelling, SOB or lightheadedness with hypotension: Yes Has patient had a PCN reaction causing severe rash involving mucus membranes or skin necrosis: No Has patient had a PCN reaction that required hospitalization: No Has patient had a PCN reaction occurring within the last 10 years: No If all of the above answers are "NO", then may proceed with Cephalosporin use.    Patient Measurements: Height: 5\' 6"  (167.6 cm) Weight: 156 lb 8.4 oz (71 kg) IBW/kg (Calculated) : 59.3  Vital Signs: Temp: 97.3 F (36.3 C) (10/12 1900) Pulse Rate: 80 (10/12 1200) Intake/Output from previous day: 10/11 0701 - 10/12 0700 In: 4846.2 [I.V.:4546.2; IV Piggyback:300] Out: 625 [Urine:625] Intake/Output from this shift: No intake/output data recorded.  Labs:  Recent Labs  01/15/17 0634 01/15/17 1113  01/15/17 1821 01/16/17 0421 01/16/17 1105 01/17/17 0430 01/17/17 1225 01/17/17 1806  WBC  --   --   --   --  7.6  --   --  14.1* 14.2*  HGB  --  9.4*  --   --  10.6*  --   --  10.6* 10.4*  HCT  --  28.3*  --   --  31.4*  --   --  31.3* 30.4*  PLT  --   --   --   --  54*  --   --  20* 16*   CREATININE  --  1.18*  < > 1.19* 1.01* 0.99 0.77  --  0.73  MG 1.4*  --   --  2.3  --   --  1.8  --   --   PHOS  --  2.9  --   --   --   --  2.1*  --   --   ALBUMIN  --  1.5*  --   --  1.7*  --   --   --   --   PROT  --   --   --   --  3.7*  --   --   --   --   AST  --   --   --   --  182*  --   --   --   --   ALT  --   --   --   --  50  --   --   --   --   ALKPHOS  --   --   --   --  89  --   --   --   --   BILITOT  --   --   --   --  3.3*  --   --   --   --  BILIDIR  --   --   --   --  2.1*  --   --   --   --   IBILI  --   --   --   --  1.2*  --   --   --   --   < > = values in this interval not displayed. Estimated Creatinine Clearance: 56.9 mL/min (by C-G formula based on SCr of 0.73 mg/dL).    Medical History: Past Medical History:  Diagnosis Date  . Arthritis   . Back pain   . Insomnia   . Medical history non-contributory   . Osteoporosis     Pharmacy will continue to monitor and adjust per consult.   Raelle Chambers L 01/17/2017,8:31 PM

## 2017-01-17 NOTE — Progress Notes (Signed)
Blood sugar resulted at 30 with a repeat on line draw resulting at 69.  One amp of D50 given and NP notified.  D5 .45NS drip changed to 28ml/hr.

## 2017-01-17 NOTE — Progress Notes (Signed)
PULMONARY / CRITICAL CARE MEDICINE   Name: Sonya Park MRN: [id number] DOB: 1940-04-17    HISTORY OF PRESENT ILLNESS:   This is a 76 yo female with a PMH of Osteoporosis, Insomnia, Back Pain, Arthritis, Bilateral Knee Replacements on aspirin every other day, and ETOH abuse.  She presented to [facility] ER 10/9 following a fall at home around 11:30 pm on the night of 10/8. Per ER notes the pt was going to the bathroom and fell down about 3 stairs face first.  The pts husband stated she could have been in the floor for about an hour before he discovered her with blood underneath her.  Per ER notes the pt does not remember having any symptoms prior to the fall.  She went to her PCP on 10/9 and a CT Head revealed periorbital and frontal scalp soft tissue thickening and new hyperattenuating material in the left frontal hemorrhage concerning for occult fracture. Upon arrival to the ER lab results revealed wbc 1.9, hgb 10.8, platelets 148, lactic acid 6.7, and pt was hypotensive meeting sepsis criteria.  Therefore, she received 2L NS bolus and iv abx initiated.  She has been recently sleeping in an RV due to mold in her house.  She was subsequently admitted by hospitalist team for further workup and treatment PCCM consulted.    REVIEW OF SYSTEMS:   Unable to assess pt ventilated   SUBJECTIVE:  Pt remains intubated requiring multiple vasopressors due to hypotension   VITAL SIGNS: BP (!) 149/136   Pulse 95   Temp 97.9 F (36.6 C)   Resp 20   Ht '5\' 6"'$  (1.676 m)   Wt 71 kg (156 lb 8.4 oz)   SpO2 100%   BMI 25.26 kg/m   HEMODYNAMICS:    VENTILATOR SETTINGS: Vent Mode: PRVC FiO2 (%):  [35 %] 35 % Set Rate:  [20 bmp] 20 bmp Vt Set:  [450 mL] 450 mL PEEP:  [5 cmH20] 5 cmH20  INTAKE / OUTPUT: I/O last 3 completed shifts: In: 5764.9 [I.V.:5464.9; IV Piggyback:300] Out: 835 [Urine:835]  PHYSICAL EXAMINATION: General: acutely ill appearing Caucasian female, NAD  Neuro: sedated, withdraws from  painful stimulation not following commands, PERRL  HEENT: diffuse ecchymosis of forehead, nasal bridge, and bilateral periorbital tissue  Cardiovascular: sinus tach, s1s2, no M/R/G Lungs: diminished throughout, even, non labored mechanically intubated  Abdomen: hypoactive BS x4, soft, non tender, non distended  Musculoskeletal: 2+ bilateral lower and upper extremity edema weeping throughout, moves all extremities  Skin: abrasion left lower extremity with erythema, cool to touch and weeping, bilateral feet cyanotic   LABS:  BMET  Recent Labs Lab 01/16/17 0421 01/16/17 1105 01/17/17 0430  NA 139 139 138  K 3.9 3.8 3.7  CL 103 98* 104  CO2 20* 24 23  BUN '13 13 14  '$ CREATININE 1.01* 0.99 0.77  GLUCOSE 62* 126* 117*    Electrolytes  Recent Labs Lab 01/15/17 0634 01/15/17 1113  01/15/17 1821 01/16/17 0421 01/16/17 1105 01/17/17 0430  CALCIUM  --  6.0*  < > 5.9* 6.0* 6.0* 5.4*  MG 1.4*  --   --  2.3  --   --  1.8  PHOS  --  2.9  --   --   --   --  2.1*  < > = values in this interval not displayed.  CBC  Recent Labs Lab 01/14/17 2008 01/15/17 0330 01/15/17 1113 01/16/17 0421  WBC 1.9* 0.8*  --  7.6  HGB 10.8* 8.5* 9.4*  10.6*  HCT 31.8* 25.5* 28.3* 31.4*  PLT 148* 84*  --  54*    Coag's  Recent Labs Lab 01/14/17 2008  INR 1.29    Sepsis Markers  Recent Labs Lab 01/15/17 0330 01/16/17 0421 01/16/17 1106 01/16/17 2225 01/17/17 0430  LATICACIDVEN 13.0*  --  10.5* 9.7*  --   PROCALCITON 4.64 8.27  --   --  5.36    ABG  Recent Labs Lab 01/15/17 1638 01/15/17 2228 01/16/17 1030  PHART 7.25* 7.28* 7.50*  PCO2ART 19* 26* 25*  PO2ART 90 129* 103    Liver Enzymes  Recent Labs Lab 01/14/17 2008 01/15/17 1113 01/16/17 0421  AST 137*  --  182*  ALT 61*  --  50  ALKPHOS 126  --  89  BILITOT 3.8*  --  3.3*  ALBUMIN 2.5* 1.5* 1.7*    Cardiac Enzymes  Recent Labs Lab 01/14/17 2008  TROPONINI <0.03    Glucose  Recent Labs Lab  01/16/17 1932 01/16/17 1954 01/16/17 2202 01/16/17 2342 01/17/17 0434 01/17/17 0745  GLUCAP 30* 69 129* 132* 90 80    Imaging No results found. STUDIES:  CT Abd, Pelvis, and Chest 10/9>>Age-indeterminate T12 inferior endplate compression deformity. Progressive chronic inferior endplate deformity involves the L3 vertebra. No evidence for solid organ or hollow viscus injury. Small pulmonary nodules measure up to 4 mm. Nonspecific. No follow-up needed if patient is low-risk (and has no known or suspected primary neoplasm). Non-contrast chest CT can be considered in 12 months if patient is high-risk.  Aortic Atherosclerosis (ICD10-I70.0). Lad coronary artery calcification noted. Hepatic steatosis. CT Maxillofacial 10/9>>Bilateral nasal bone fractures without significant displacement. Left frontal hemorrhage suggest an occult fracture. No discrete fracture is visualized. Extensive bilateral periorbital soft swelling and hematoma with inflammatory changes extending over the maxilla bilaterally, left greater than right. CT Head 10/9>>Periorbital and frontal scalp soft tissue thickening, without acute intracranial abnormality. New hyperattenuating material in the left frontal sinus could represent hemorrhage. No acute fracture is seen. If this is a clinical concern, consider dedicated face CT. Osseous irregularity about both nasal bones is felt to be grossly similar back to 10/16/2012. Correlate with clinical exam to exclude acute superimposed injury. Cerebral atrophy and small vessel ischemic change  CULTURES: Blood 10/9>>enterobacteriaceae and ecoli  Urine 10/9>>negative Fungus 10/10>>  ANTIBIOTICS: Vancomycin 10/9 x1 dose  Levaquin 10/9 x1 dose  Aztreonam 10/9>>10/10 Meropenem 10/9>>10/12 Diflucan 10/10>> Ciprofloxacin 10/12>> Cefazolin 10/12>>  SIGNIFICANT EVENTS: 10/9-Pt admitted to ICU  10/10-Pt mechanically intubated due to severe metabolic acidosis and encephalopathy    LINES/TUBES: Right IJ 10/9>> ETT 10/10>> Right femoral a line 10/10>>  ASSESSMENT / PLAN:  PULMONARY A: Mechanical Intubation due to metabolic acidosis and encephalopathy  P:   Full vent support wean as tolerated SBT once all parameters met Continue stress dose steroids  VAP bundle Prn CXR Repeat ABG   CARDIOVASCULAR A:  Severe septic shock with hypotension  P:  Continuous telemetry monitoring Continue vasopressin, neo-synephrine, and levophed gtts to maintain map >65  RENAL A:   Metabolic Acidosis  Hypocalcemia  Mild Rhabdomyolysis  Severe Lactic Acidosis  P:   Trend BMP Replace electrolytes as indicated  Monitor UOP  GASTROINTESTINAL A:   Transaminitis Hyperbilirubinemia P:   Trend hepatic panel  OG tube to LIS  Keep NPO for now still requiring multiple vasopressors   Protonix for SUP   HEMATOLOGIC A: Left frontal hemorrhage concerning for occult fracture and extensive bilateral periorbital soft swelling and hematoma s/p fall  Anemia with acute blood loss-improving   Thrombocytopenia  Neutropenia  P:  Trend CBC  SCD's for VTE prophylaxis, avoid chemical prophylaxis for now  Monitor for s/sx of bleeding  Transfuse for hgb <7 Neutropenic precautions discontinued   INFECTIOUS A:   Bacteremia-blood cultures positive enterobacteriaceae and ecoli 10/9 Suspected left lower extremity cellulitis Recent exposure to mold  P:   Trend WBC and monitor fever curve Trend PCT Follow cultures Continue abx as listed above   ENDOCRINE A:   Hypoglycemia  P:   CBG's q4hrs Will add SSI if cbg's >180  Continue D51/2NS '@75'$  ml/hr  NEUROLOGIC A:   Metabolic encephalopathy  ETOH abuse ICU/ Mechanical Ventilation Discomfort  P:   RASS goal: 0 to -1 Fentanyl gtt for pain management and to maintain RASS goal  Will add thiamine and folic acid daily  CIWA protocol   FAMILY  - Updates: No family currently at bedside to update 01/17/2017  -Palliative  Care Consult pending to discuss goals of treatment 01/17/2017    [name], [place] Pager [phone] (please enter 7 digits) PCCM Consult Pager [phone] (please enter 7 digits)

## 2017-01-17 NOTE — Progress Notes (Signed)
Pharmacy Antibiotic Note  Sonya Park is a 76 y.o. female admitted on 01/14/2017 with sepsis and e coli bacteremia.  Pharmacy has been consulted for ciprofloxacin dosing.  Plan: Discontinue meropenem due to non ESBL e coli sensitive to narrower therapy. Initiate ciprofloxacin 400 mg IV Q 12 H as PCN allergy prevents cephalosporin use.   Height: 5\' 6"  (167.6 cm) Weight: 156 lb 8.4 oz (71 kg) IBW/kg (Calculated) : 59.3  Temp (24hrs), Avg:99.1 F (37.3 C), Min:97.9 F (36.6 C), Max:99.9 F (37.7 C)   Recent Labs Lab 01/14/17 2008 01/14/17 2308 01/15/17 0330  01/15/17 1521 01/15/17 1821 01/16/17 0421 01/16/17 1105 01/16/17 1106 01/16/17 2225 01/17/17 0430  WBC 1.9*  --  0.8*  --   --   --  7.6  --   --   --   --   CREATININE 1.07*  --  1.22*  < > 1.17* 1.19* 1.01* 0.99  --   --  0.77  LATICACIDVEN  --  6.7* 13.0*  --   --   --   --   --  10.5* 9.7*  --   < > = values in this interval not displayed.  Estimated Creatinine Clearance: 56.9 mL/min (by C-G formula based on SCr of 0.77 mg/dL).    Allergies  Allergen Reactions  . Other Itching    "mycin" doesn't know which one  . Penicillins Swelling    Has patient had a PCN reaction causing immediate rash, facial/tongue/throat swelling, SOB or lightheadedness with hypotension: Yes Has patient had a PCN reaction causing severe rash involving mucus membranes or skin necrosis: No Has patient had a PCN reaction that required hospitalization: No Has patient had a PCN reaction occurring within the last 10 years: No If all of the above answers are "NO", then may proceed with Cephalosporin use.    Antimicrobials this admission:  Levofloxacin 10/9 >> 10/10 Vancomycin 10/9 >> 10/10 Fluconazole 10/10 >> 10/10 Meropenem 10/9 >> 10/11 Ciprofloxacin 10/12 >>   Dose adjustments this admission:   Microbiology results: 10/9 BCx (x2): non ESBL e coli 10/9 BCID: e coli 10/9 UCx: <10,000 colonies/ml insignificant growth 10/10  Fungus Cx: sent  Thank you for allowing pharmacy to be a part of this patient's care.  Oralia Manis 01/17/2017 10:47 AM

## 2017-01-17 NOTE — Progress Notes (Signed)
[place] [facility] [place] NAME: Sonya Park    MR#:  [id number]  DATE OF BIRTH:  [birth year]  SUBJECTIVE:   Patient here after a mechanical fall with significant bruising on her face. Remains in critical condition and in septic shock on multiple vasopressors. Patient having significant third spacing. Remains intubated and sedated.  REVIEW OF SYSTEMS:    Review of Systems  Unable to perform ROS: Mental acuity    Nutrition: tube feeds Tolerating Diet: Yes Tolerating PT: Await Eval once extubated.   DRUG ALLERGIES:   Allergies  Allergen Reactions  . Other Itching    "mycin" doesn't know which one  . Penicillins Swelling    Has patient had a PCN reaction causing immediate rash, facial/tongue/throat swelling, SOB or lightheadedness with hypotension: Yes Has patient had a PCN reaction causing severe rash involving mucus membranes or skin necrosis: No Has patient had a PCN reaction that required hospitalization: No Has patient had a PCN reaction occurring within the last 10 years: No If all of the above answers are "NO", then may proceed with Cephalosporin use.    VITALS:  Blood pressure (!) 149/136, pulse 80, temperature (!) 97.5 F (36.4 C), resp. rate 20, height 5\' 6"  (1.676 m), weight 71 kg (156 lb 8.4 oz), SpO2 100 %.  PHYSICAL EXAMINATION:   Physical Exam  GENERAL:  76 y.o.-year-old patient lying in bed sedated & intubated.   EYES: Pupils equal, round, reactive to light. No scleral icterus. Extraocular muscles intact. Significant bruising and periorbital edema with bruising from her recent fall. HEENT: Significant bruising around the face and periorbital area.,normocephalic. ET and OG tubes in place.  NECK:  Supple, no jugular venous distention. No thyroid enlargement, no tenderness.  LUNGS: Normal breath sounds bilaterally, no wheezing, rales, rhonchi. No use of accessory muscles of respiration.  CARDIOVASCULAR: S1, S2 normal. No  murmurs, rubs, or gallops.  ABDOMEN: Soft, nontender, nondistended. Bowel sounds present. No organomegaly or mass.  EXTREMITIES: No clubbing, +2 edema b/l.  Cyanotic appearing Toes on the left foot.  Severe anasarca with third spacing. NEUROLOGIC: Sedated & intubated.  PSYCHIATRIC: Sedated & intubated.  SKIN: No obvious rash, lesion, or ulcer.    LABORATORY PANEL:   CBC  Recent Labs Lab 01/17/17 1225  WBC 14.1*  HGB 10.6*  HCT 31.3*  PLT 20*   ------------------------------------------------------------------------------------------------------------------  Chemistries   Recent Labs Lab 01/16/17 0421  01/17/17 0430  NA 139  < > 138  K 3.9  < > 3.7  CL 103  < > 104  CO2 20*  < > 23  GLUCOSE 62*  < > 117*  BUN 13  < > 14  CREATININE 1.01*  < > 0.77  CALCIUM 6.0*  < > 5.4*  MG  --   --  1.8  AST 182*  --   --   ALT 50  --   --   ALKPHOS 89  --   --   BILITOT 3.3*  --   --   < > = values in this interval not displayed. ------------------------------------------------------------------------------------------------------------------  Cardiac Enzymes  Recent Labs Lab 01/14/17 2008  TROPONINI <0.03   ------------------------------------------------------------------------------------------------------------------  RADIOLOGY:  Dg Chest Port 1 View  Result Date: 01/16/2017 CLINICAL DATA:  Respiratory failure. EXAM: PORTABLE CHEST 1 VIEW COMPARISON:  01/15/2017. FINDINGS: Endotracheal tube, NG tube, right IJ line stable position. Heart size stable. Persistent atelectasis/ consolidation left lower lobe. Persistent small left pleural effusion. No pneumothorax. Chest is  unchanged. IMPRESSION: 1. Lines and tubes in stable position. 2. Persistent left lower lobe atelectasis and consolidation and small left pleural effusion. No interim change from prior exam. Electronically Signed   By: Marcello Moores  Register   On: 01/16/2017 06:30   Dg Chest Port 1 View  Result Date:  01/15/2017 CLINICAL DATA:  76 year old female with a history of intubation EXAM: PORTABLE CHEST 1 VIEW COMPARISON:  01/15/2017, CT 01/14/2017 FINDINGS: Cardiomediastinal silhouette unchanged in size and contour. Calcifications of the aortic arch. Interval placement of endotracheal tube which terminates approximately 3 cm 2.8 cm above the carina. Unchanged right IJ central catheter which appears to terminate superior vena cava. No pneumothorax. Opacities at the bilateral lung bases with increasing opacity on the left. Partial obscuration left hemidiaphragm. IMPRESSION: Interval placement of endotracheal tube which terminates approximately 2.8 cm above the carina. Unchanged right IJ central catheter. Increasing opacity at the left base, potentially combination of pleural fluid and/or atelectasis/consolidation. Electronically Signed   By: Corrie Mckusick D.O.   On: 01/15/2017 19:50   Dg Abd Portable 1v  Result Date: 01/15/2017 CLINICAL DATA:  Nasogastric tube placement.  Initial encounter. EXAM: PORTABLE ABDOMEN - 1 VIEW COMPARISON:  CT of the abdomen and pelvis from 01/14/2017 FINDINGS: The patient's enteric tube is noted ending overlying the body of the stomach. The visualized bowel gas pattern is unremarkable. Scattered air and stool filled loops of colon are seen; no abnormal dilatation of small bowel loops is seen to suggest small bowel obstruction. No free intra-abdominal air is identified, though evaluation for free air is limited on a single supine view. The visualized osseous structures are within normal limits; the sacroiliac joints are unremarkable in appearance. IMPRESSION: Enteric tube noted ending overlying the body of the stomach. Electronically Signed   By: Garald Balding M.D.   On: 01/15/2017 21:59     ASSESSMENT AND PLAN:   76 year old female with past medical history of back pain, osteoporosis, arthritis, alcohol abuse who presents to the hospital after a fall and noted to have significant  facial and periorbital bruising along with nasal bone fractures and also noted to be in severe shock.  1. Shock-this is suspected to be septic shock. Patient presented with severe hypotension, elevated lactic acid. The source of the sepsis remains unclear but likely GU/GI. Patient's blood cultures were positive for E. Coli and  Continue IV Cipro - UA was (-), CT chest abdomen pelvis was negative for acute pathology. -Continue supportive care with IV fluids, IV vasopressors, IV abx, IV steroids.    2. Acute respiratory failure-patient was intubated due to worsening respiratory distress. -Continue vent support as per pulmonary. Patient is on 35% FiO2 presently. Wean off vent as per intensivist/pulmonary.  3. Alcohol abuse-patient is high risk for alcohol withdrawal. - currently sedated & intubated.   4. Altered mental status/encephalopathy-etiology unclear but likely secondary to severe hypotension and shock. CT head was negative for acute pathology. -Continue supportive care to treat underlying severe shock and follow mental status once extubated.   5. Pancytopenia-etiology unclear but suspected to be secondary to alcohol abuse with bone marrow suppression. Also could be secondary to underlying sepsis.  - WBC count improved.  Cont. Empiric Abx as mentioned above and follow counts.   6. Hypokalemia/hypomagnesemia-continue ICU replacement protocol.  - improved with supplementation.   Patient's prognosis is very poor given the significant third spacing anasarca and not much improvement over the past 48 hours. Palliative care consult is pending to discuss goals of care.  All the records are reviewed and case discussed with Care Management/Social Worker. Management plans discussed with the patient, family and they are in agreement.  CODE STATUS: Full code  DVT Prophylaxis: Hep SQ  TOTAL TIME TAKING CARE OF THIS PATIENT: 30 minutes.   POSSIBLE D/C unclear DEPENDING ON CLINICAL CONDITION and  progress.   Henreitta Leber M.D on 01/17/2017 at 3:18 PM  Between 7am to 6pm - Pager - [phone]  After 6pm go to [email or web address] - Proofreader  [facility]  Office  [phone]  CC: Primary care physician; Cletis Athens, MD

## 2017-01-18 ENCOUNTER — Inpatient Hospital Stay
Admit: 2017-01-18 | Discharge: 2017-01-18 | Disposition: A | Discharge status: Home / Self Care | Payer: Medicare Other | Attending: Pulmonary Disease | Admitting: Pulmonary Disease

## 2017-01-18 LAB — CBC WITH DIFFERENTIAL/PLATELET
BASOS PCT: 0 %
Basophils Absolute: 0 10*3/uL (ref 0–0.1)
EOS PCT: 0 %
Eosinophils Absolute: 0 10*3/uL (ref 0–0.7)
HEMATOCRIT: 29.2 % — AB (ref 35.0–47.0)
HEMOGLOBIN: 9.9 g/dL — AB (ref 12.0–16.0)
LYMPHS ABS: 0.6 10*3/uL — AB (ref 1.0–3.6)
Lymphocytes Relative: 4 %
MCH: 35.8 pg — ABNORMAL HIGH (ref 26.0–34.0)
MCHC: 34 g/dL (ref 32.0–36.0)
MCV: 105.1 fL — AB (ref 80.0–100.0)
MONO ABS: 0.7 10*3/uL (ref 0.2–0.9)
MONOS PCT: 5 %
Neutro Abs: 13.4 10*3/uL — ABNORMAL HIGH (ref 1.4–6.5)
Neutrophils Relative %: 91 %
Platelets: 11 10*3/uL — CL (ref 150–440)
RBC: 2.77 MIL/uL — AB (ref 3.80–5.20)
RDW: 17.1 % — AB (ref 11.5–14.5)
WBC: 14.7 10*3/uL — AB (ref 3.6–11.0)

## 2017-01-18 LAB — GLUCOSE, CAPILLARY
GLUCOSE-CAPILLARY: 72 mg/dL (ref 65–99)
GLUCOSE-CAPILLARY: 79 mg/dL (ref 65–99)
GLUCOSE-CAPILLARY: 79 mg/dL (ref 65–99)
GLUCOSE-CAPILLARY: 90 mg/dL (ref 65–99)
GLUCOSE-CAPILLARY: 92 mg/dL (ref 65–99)
Glucose-Capillary: 96 mg/dL (ref 65–99)

## 2017-01-18 LAB — BASIC METABOLIC PANEL
Anion gap: 9 (ref 5–15)
BUN: 17 mg/dL (ref 6–20)
CHLORIDE: 101 mmol/L (ref 101–111)
CO2: 25 mmol/L (ref 22–32)
CREATININE: 0.75 mg/dL (ref 0.44–1.00)
Calcium: 6.1 mg/dL — CL (ref 8.9–10.3)
GFR calc Af Amer: >60 mL/min (ref >60)
GFR calc non Af Amer: >60 mL/min (ref >60)
Glucose, Bld: 120 mg/dL — ABNORMAL HIGH (ref 65–99)
POTASSIUM: 3.5 mmol/L (ref 3.5–5.1)
Sodium: 135 mmol/L (ref 135–145)

## 2017-01-18 LAB — BLOOD GAS, ARTERIAL
ACID-BASE EXCESS: 1.3 mmol/L (ref 0.0–2.0)
Bicarbonate: 23.7 mmol/L (ref 20.0–28.0)
FIO2: 0.35
MECHANICAL RATE: 20
O2 SAT: 97.9 %
PCO2 ART: 29 mmHg — AB (ref 32.0–48.0)
PEEP: 5 cmH2O
PH ART: 7.52 — AB (ref 7.350–7.450)
PO2 ART: 92 mmHg (ref 83.0–108.0)
Patient temperature: 37
VT: 450 mL

## 2017-01-18 LAB — PHOSPHORUS: Phosphorus: 1.8 mg/dL — ABNORMAL LOW (ref 2.5–4.6)

## 2017-01-18 LAB — MAGNESIUM: MAGNESIUM: 2.1 mg/dL (ref 1.7–2.4)

## 2017-01-18 LAB — AMMONIA: Ammonia: 54 umol/L — ABNORMAL HIGH (ref 9–35)

## 2017-01-18 MED ORDER — POTASSIUM CHLORIDE 10 MEQ/50ML IV SOLN
10.0000 meq | INTRAVENOUS | Status: AC
  Administered 2017-01-18 (×3): 10 meq via INTRAVENOUS
  Filled 2017-01-18 (×3): qty 50

## 2017-01-18 MED ORDER — SODIUM CHLORIDE 0.9 % IV SOLN
2.0000 g | Freq: Once | INTRAVENOUS | Status: AC
  Administered 2017-01-18: 2 g via INTRAVENOUS
  Filled 2017-01-18: qty 20

## 2017-01-18 MED ORDER — DEXTROSE 5 % IV SOLN
30.0000 mmol | Freq: Once | INTRAVENOUS | Status: AC
  Administered 2017-01-18: 30 mmol via INTRAVENOUS
  Filled 2017-01-18: qty 10

## 2017-01-18 MED ORDER — SODIUM CHLORIDE 0.9 % IV SOLN
1.0000 g | Freq: Once | INTRAVENOUS | Status: AC
  Administered 2017-01-18: 1 g via INTRAVENOUS
  Filled 2017-01-18: qty 10

## 2017-01-18 MED ORDER — CHLORHEXIDINE GLUCONATE 0.12 % MT SOLN
OROMUCOSAL | Status: AC
  Administered 2017-01-18: 15 mL via OROMUCOSAL
  Filled 2017-01-18: qty 15

## 2017-01-18 NOTE — Progress Notes (Signed)
[facility] for electrolyte management   Pharmacy consulted for electrolyte management for 76 yo female admitted to the ICU with sepsis. Patient has history significant for alcohol abuse and is on CIWA.   Patient ordered D5-1/2NS @ 49mL/hr. Patient ordered Lactulose BID.   K: 3.5, Phos 1.8, Ca 6.1 (Corr 7.9)  Plan:  Potassium and Phos replaced IV this morning.  Calcium gluconate 1gm IV x 1  Recheck electrolytes with AM labs   Allergies  Allergen Reactions  . Other Itching    "mycin" doesn't know which one  . Penicillins Swelling    Has patient had a PCN reaction causing immediate rash, facial/tongue/throat swelling, SOB or lightheadedness with hypotension: Yes Has patient had a PCN reaction causing severe rash involving mucus membranes or skin necrosis: No Has patient had a PCN reaction that required hospitalization: No Has patient had a PCN reaction occurring within the last 10 years: No If all of the above answers are "NO", then may proceed with Cephalosporin use.    Patient Measurements: Height: 5\' 6"  (167.6 cm) Weight: 156 lb 8.4 oz (71 kg) IBW/kg (Calculated) : 59.3  Vital Signs: Temp: 97.7 F (36.5 C) (10/13 0930) Temp Source: Core (Comment) (10/13 0400) Pulse Rate: 67 (10/13 0930) Intake/Output from previous day: 10/12 0701 - 10/13 0700 In: 3035.2 [I.V.:2675.2; NG/GT:110; IV Piggyback:250] Out: 433 [Urine:433] Intake/Output from this shift: Total I/O In: 582.5 [I.V.:102.5; IV Piggyback:480] Out: -   Labs:  Recent Labs  01/15/17 1113  01/15/17 1821  01/16/17 0421  01/17/17 0430 01/17/17 1225 01/17/17 1806 01/18/17 0421  WBC  --   --   --   < > 7.6  --   --  14.1* 14.2* 14.7*  HGB 9.4*  --   --   --  10.6*  --   --  10.6* 10.4* 9.9*  HCT 28.3*  --   --   --  31.4*  --   --  31.3* 30.4* 29.2*  PLT  --   --   --   < > 54*  --   --  20* 16* 11*  CREATININE 1.18*  < > 1.19*  --  1.01*  < > 0.77  --  0.73 0.75  MG   --   --  2.3  --   --   --  1.8  --   --  2.1  PHOS 2.9  --   --   --   --   --  2.1*  --   --  1.8*  ALBUMIN 1.5*  --   --   --  1.7*  --   --   --   --   --   PROT  --   --   --   --  3.7*  --   --   --   --   --   AST  --   --   --   --  182*  --   --   --   --   --   ALT  --   --   --   --  50  --   --   --   --   --   ALKPHOS  --   --   --   --  63  --   --   --   --   --   BILITOT  --   --   --   --  3.3*  --   --   --   --   --   BILIDIR  --   --   --   --  2.1*  --   --   --   --   --   IBILI  --   --   --   --  1.2*  --   --   --   --   --   < > = values in this interval not displayed. Estimated Creatinine Clearance: 56.9 mL/min (by C-G formula based on SCr of 0.75 mg/dL).    Medical History: Past Medical History:  Diagnosis Date  . Arthritis   . Back pain   . Insomnia   . Medical history non-contributory   . Osteoporosis     Pharmacy will continue to monitor and adjust per consult.   Meridian Scherger C 01/18/2017,9:55 AM

## 2017-01-18 NOTE — Progress Notes (Signed)
CRITICAL VALUE ALERT  Critical Value:  6.1 Calcium   Date & Time Notied:  01/18/17 @ 0452  Provider Notified: Ms. Patria Mane, NP @ [phone]  Orders Received/Actions taken:  NP stated she would look over labs and order replacement accordingly

## 2017-01-18 NOTE — Progress Notes (Signed)
Patient ID: Sonya Park, female   DOB: [birth year], 76 y.o.   MRN: [id number]  [facility] PROGRESS NOTE  Sonya Park GDJ:[id number] DOB: 10-14-1940 DOA: 01/14/2017 PCP: Cletis Athens, MD  HPI/Subjective: Patient intubated and sedated. Husband stated that he found her in the bathroom on the floor with blood on the floor.  He picked her up and carried her down the steps, her feet were dragged down the steps.  He got her set on the steps. He was able to get her to the doctor's office who sent her into the hospital.  Objective: Vitals:   01/18/17 1315 01/18/17 1330  BP:    Pulse:    Resp: 20 20  Temp: 97.7 F (36.5 C) 97.7 F (36.5 C)  SpO2:      Intake/Output Summary (Last 24 hours) at 01/18/17 1449 Last data filed at 01/18/17 1300  Gross per 24 hour  Intake          3439.19 ml  Output              478 ml  Net          2961.19 ml   Filed Weights   01/14/17 1456 01/15/17 1530  Weight: 63 kg (139 lb) 71 kg (156 lb 8.4 oz)    ROS: Review of Systems  Unable to perform ROS: Acuity of condition   Exam: Physical Exam  Constitutional: She is intubated.  HENT:  Nose: No mucosal edema.  Eyes: Pupils are equal, round, and reactive to light.  Icteric.   Neck: Carotid bruit is not present. No thyromegaly present.  Cardiovascular: Regular rhythm, S1 normal and S2 normal.   Respiratory: She is intubated. She has decreased breath sounds in the right lower field and the left lower field. She has no wheezes. She has no rhonchi. She has no rales.  GI: Soft. Bowel sounds are normal. There is no tenderness.  Musculoskeletal:       Right elbow: She exhibits swelling.       Left elbow: She exhibits swelling.       Right wrist: She exhibits swelling.       Left wrist: She exhibits tenderness.       Right knee: She exhibits swelling.       Left knee: She exhibits swelling.       Right ankle: She exhibits swelling.       Left ankle: She exhibits swelling.  Neurological:  Patient  intubated and sedated  Skin:  Large ulceration left posterior calf. Demarcation of the toes with a dusky look. Bruising upper extremities and lower extremities.  Bruising around bilateral eyelids  Psychiatric:  Patient intubated and sedated      Data Reviewed: Basic Metabolic Panel:  Recent Labs Lab 01/15/17 0634 01/15/17 1113  01/15/17 1821 01/16/17 0421 01/16/17 1105 01/17/17 0430 01/17/17 1806 01/18/17 0421  NA  --  135  < > 136 139 139 138 136 135  K  --  3.9  < > 3.9 3.9 3.8 3.7 3.3* 3.5  CL  --  107  < > 108 103 98* 104 102 101  CO2  --  11*  < > 12* 20* '24 23 24 25  '$ GLUCOSE  --  122*  < > 120* 62* 126* 117* 124* 120*  BUN  --  12  < > '12 13 13 14 15 17  '$ CREATININE  --  1.18*  < > 1.19* 1.01* 0.99 0.77 0.73 0.75  CALCIUM  --  6.0*  < > 5.9* 6.0* 6.0* 5.4* 6.3* 6.1*  MG 1.4*  --   --  2.3  --   --  1.8  --  2.1  PHOS  --  2.9  --   --   --   --  2.1*  --  1.8*  < > = values in this interval not displayed. Liver Function Tests:  Recent Labs Lab 01/14/17 2008 01/15/17 1113 01/16/17 0421  AST 137*  --  182*  ALT 61*  --  50  ALKPHOS 126  --  89  BILITOT 3.8*  --  3.3*  PROT 4.9*  --  3.7*  ALBUMIN 2.5* 1.5* 1.7*    Recent Labs Lab 01/17/17 1225 01/18/17 0421  AMMONIA 52* 54*   CBC:  Recent Labs Lab 01/14/17 2008 01/15/17 0330 01/15/17 1113 01/16/17 0421 01/17/17 1225 01/17/17 1806 01/18/17 0421  WBC 1.9* 0.8*  --  7.6 14.1* 14.2* 14.7*  NEUTROABS 1.3*  --   --   --  13.0* 13.0* 13.4*  HGB 10.8* 8.5* 9.4* 10.6* 10.6* 10.4* 9.9*  HCT 31.8* 25.5* 28.3* 31.4* 31.3* 30.4* 29.2*  MCV 107.0* 108.3*  --  109.1* 107.3* 105.6* 105.1*  PLT 148* 84*  --  54* 20* 16* 11*   Cardiac Enzymes:  Recent Labs Lab 01/14/17 2008  CKTOTAL 569*  TROPONINI <0.03    CBG:  Recent Labs Lab 01/17/17 1227 01/17/17 1953 01/18/17 0022 01/18/17 0423 01/18/17 0823  GLUCAP 135* 76 79 90 92    Recent Results (from the past 240 hour(s))  Blood Culture  (routine x 2)     Status: Abnormal   Collection Time: 01/14/17 11:08 PM  Result Value Ref Range Status   Specimen Description BLOOD LFOA  Final   Special Requests   Final    BOTTLES DRAWN AEROBIC AND ANAEROBIC Blood Culture results may not be optimal due to an excessive volume of blood received in culture bottles   Culture  Setup Time   Final    GRAM NEGATIVE RODS IN BOTH AEROBIC AND ANAEROBIC BOTTLES CRITICAL RESULT CALLED TO, READ BACK BY AND VERIFIED WITH:  MICHAEL SIMPSON AT 1207 01/15/17 SDR    Culture ESCHERICHIA COLI (A)  Final   Report Status 01/17/2017 FINAL  Final   Organism ID, Bacteria ESCHERICHIA COLI  Final      Susceptibility   Escherichia coli - MIC*    AMPICILLIN >=32 RESISTANT Resistant     CEFAZOLIN <=4 SENSITIVE Sensitive     CEFEPIME <=1 SENSITIVE Sensitive     CEFTAZIDIME <=1 SENSITIVE Sensitive     CEFTRIAXONE <=1 SENSITIVE Sensitive     CIPROFLOXACIN 1 SENSITIVE Sensitive     GENTAMICIN <=1 SENSITIVE Sensitive     IMIPENEM <=0.25 SENSITIVE Sensitive     TRIMETH/SULFA >=320 RESISTANT Resistant     AMPICILLIN/SULBACTAM >=32 RESISTANT Resistant     PIP/TAZO <=4 SENSITIVE Sensitive     Extended ESBL NEGATIVE Sensitive     * ESCHERICHIA COLI  Blood Culture (routine x 2)     Status: Abnormal   Collection Time: 01/14/17 11:08 PM  Result Value Ref Range Status   Specimen Description BLOOD LAC  Final   Special Requests   Final    BOTTLES DRAWN AEROBIC AND ANAEROBIC Blood Culture results may not be optimal due to an excessive volume of blood received in culture bottles   Culture  Setup Time   Final    GRAM NEGATIVE RODS IN BOTH AEROBIC AND ANAEROBIC  BOTTLES CRITICAL VALUE NOTED.  VALUE IS CONSISTENT WITH PREVIOUSLY REPORTED AND CALLED VALUE.    Culture (A)  Final    ESCHERICHIA COLI SUSCEPTIBILITIES PERFORMED ON PREVIOUS CULTURE WITHIN THE LAST 5 DAYS. Performed at [facility], [address] [place], [place] [address]    Report Status 01/17/2017  FINAL  Final  Urine culture     Status: Abnormal   Collection Time: 01/14/17 11:08 PM  Result Value Ref Range Status   Specimen Description URINE, RANDOM  Final   Special Requests NONE  Final   Culture (A)  Final    <10,000 COLONIES/mL INSIGNIFICANT GROWTH Performed at [facility], [address] [place], [place] [address]    Report Status 01/16/2017 FINAL  Final  Blood Culture ID Panel (Reflexed)     Status: Abnormal   Collection Time: 01/14/17 11:08 PM  Result Value Ref Range Status   Enterococcus species NOT DETECTED NOT DETECTED Final   Listeria monocytogenes NOT DETECTED NOT DETECTED Final   Staphylococcus species NOT DETECTED NOT DETECTED Final   Staphylococcus aureus NOT DETECTED NOT DETECTED Final   Streptococcus species NOT DETECTED NOT DETECTED Final   Streptococcus agalactiae NOT DETECTED NOT DETECTED Final   Streptococcus pneumoniae NOT DETECTED NOT DETECTED Final   Streptococcus pyogenes NOT DETECTED NOT DETECTED Final   Acinetobacter baumannii NOT DETECTED NOT DETECTED Final   Enterobacteriaceae species DETECTED (A) NOT DETECTED Final    Comment: Enterobacteriaceae represent a large family of gram-negative bacteria, not a single organism. CRITICAL RESULT CALLED TO, READ BACK BY AND VERIFIED WITH:  MICHAEL SIMPSON AT 1207 01/15/17 SDR    Enterobacter cloacae complex NOT DETECTED NOT DETECTED Final   Escherichia coli DETECTED (A) NOT DETECTED Final    Comment: CRITICAL RESULT CALLED TO, READ BACK BY AND VERIFIED WITH:  MICHAEL SIMPSON AT 1207 01/15/17 SDR    Klebsiella oxytoca NOT DETECTED NOT DETECTED Final   Klebsiella pneumoniae NOT DETECTED NOT DETECTED Final   Proteus species NOT DETECTED NOT DETECTED Final   Serratia marcescens NOT DETECTED NOT DETECTED Final   Carbapenem resistance NOT DETECTED NOT DETECTED Final   Haemophilus influenzae NOT DETECTED NOT DETECTED Final   Neisseria meningitidis NOT DETECTED NOT DETECTED Final   Pseudomonas aeruginosa  NOT DETECTED NOT DETECTED Final   Candida albicans NOT DETECTED NOT DETECTED Final   Candida glabrata NOT DETECTED NOT DETECTED Final   Candida krusei NOT DETECTED NOT DETECTED Final   Candida parapsilosis NOT DETECTED NOT DETECTED Final   Candida tropicalis NOT DETECTED NOT DETECTED Final     Studies: Dg Abd 1 View  Result Date: 01/17/2017 CLINICAL DATA:  Placement of orogastric tube EXAM: ABDOMEN - 1 VIEW COMPARISON:  Radiographs 10//10/18 FINDINGS: NG tube extends the stomach.  Side port below the GE junction. IMPRESSION: NG tube in stomach.  Side port below the GE junction Electronically Signed   By: Genevive Bi M.D.   On: 01/17/2017 21:25    Scheduled Meds: . chlorhexidine gluconate (MEDLINE KIT)  15 mL Mouth Rinse BID  . EPINEPHrine  1 mg Intravenous Once  . folic acid  1 mg Intravenous Daily  . hydrocortisone sod succinate (SOLU-CORTEF) inj  50 mg Intravenous Q6H  . lactulose  20 g Oral BID  . mouth rinse  15 mL Mouth Rinse 10 times per day  . pantoprazole (PROTONIX) IV  40 mg Intravenous Q24H  . rocuronium  1 mg/kg Intravenous Once  . sodium chloride flush  10-40 mL Intracatheter  Q12H  . thiamine injection  100 mg Intravenous Daily   Continuous Infusions: . ciprofloxacin Stopped (01/18/17 1338)  . dextrose 5 % and 0.45 % NaCl with KCl 20 mEq/L 75 mL/hr at 01/18/17 1300  . fentaNYL infusion INTRAVENOUS 100 mcg/hr (01/18/17 1300)  . norepinephrine (LEVOPHED) Adult infusion 8 mcg/min (01/18/17 1338)  . phenylephrine (NEO-SYNEPHRINE) Adult infusion Stopped (01/17/17 1500)  . vasopressin (PITRESSIN) infusion - *FOR SHOCK* 0.03 Units/min (01/18/17 1300)    Assessment/Plan:  1. Septic shock with Escherichia coli. Patient currently on 2 pressors to maintain blood pressure. Patient on stress dose steroids.  Patient on IV Cipro. 2. Acute respiratory failure with hypoxia. Patient currently on 35% FiO2. 3. Alcohol abuse. Patient currently on the ventilator. 4. Acute  encephalopathy secondary to severe sepsis 5. Pancytopenia could be secondary to alcohol abuse.  Could also possibly be secondary to DIC with the drop in platelets. 6. Hypomagnesemia and hypokalemia. ICU electrolyte replacement protocol 7. Anasarca and third spacing of fluids 8. Jaundice likely secondary to shock  Code Status:     Code Status Orders        Start     Ordered   01/15/17 0158  Full code  Continuous     01/15/17 0157    Code Status History    Date Active Date Inactive Code Status Order ID Comments User Context   12/13/2015 11:49 AM 12/15/2015  7:28 PM Full Code [id number]  Dereck Leep, MD Inpatient   09/17/2014  6:01 AM 09/18/2014  3:51 PM Full Code [id number]  Juluis Mire, MD Inpatient     Family Communication: spoke with husband at the bedside Disposition Plan: requiring ICU care  Consultants:  Critical care specialist  Antibiotics:  Cipro  Time spent: 30 minutes  Loletha Grayer  [employer]

## 2017-01-19 DIAGNOSIS — L8990 Pressure ulcer of unspecified site, unspecified stage: Secondary | ICD-10-CM | POA: Insufficient documentation

## 2017-01-19 LAB — BASIC METABOLIC PANEL
ANION GAP: 8 (ref 5–15)
Anion gap: 11 (ref 5–15)
Anion gap: 7 (ref 5–15)
BUN: 18 mg/dL (ref 6–20)
BUN: 19 mg/dL (ref 6–20)
BUN: 21 mg/dL — AB (ref 6–20)
CALCIUM: 6.1 mg/dL — AB (ref 8.9–10.3)
CHLORIDE: 100 mmol/L — AB (ref 101–111)
CHLORIDE: 102 mmol/L (ref 101–111)
CO2: 22 mmol/L (ref 22–32)
CO2: 23 mmol/L (ref 22–32)
CO2: 25 mmol/L (ref 22–32)
Calcium: 6.7 mg/dL — ABNORMAL LOW (ref 8.9–10.3)
Calcium: 6.8 mg/dL — ABNORMAL LOW (ref 8.9–10.3)
Chloride: 102 mmol/L (ref 101–111)
Creatinine, Ser: 0.73 mg/dL (ref 0.44–1.00)
Creatinine, Ser: 0.81 mg/dL (ref 0.44–1.00)
Creatinine, Ser: 0.89 mg/dL (ref 0.44–1.00)
GFR calc Af Amer: >60 mL/min (ref >60)
GFR calc Af Amer: >60 mL/min (ref >60)
GFR calc non Af Amer: >60 mL/min (ref >60)
GFR calc non Af Amer: >60 mL/min (ref >60)
GLUCOSE: 102 mg/dL — AB (ref 65–99)
GLUCOSE: 100 mg/dL — AB (ref 65–99)
Glucose, Bld: 106 mg/dL — ABNORMAL HIGH (ref 65–99)
POTASSIUM: 4.2 mmol/L (ref 3.5–5.1)
POTASSIUM: 4.5 mmol/L (ref 3.5–5.1)
Potassium: 3.8 mmol/L (ref 3.5–5.1)
SODIUM: 132 mmol/L — AB (ref 135–145)
Sodium: 134 mmol/L — ABNORMAL LOW (ref 135–145)
Sodium: 134 mmol/L — ABNORMAL LOW (ref 135–145)

## 2017-01-19 LAB — ECHOCARDIOGRAM COMPLETE
Area-P 1/2: 2.62 cm2
E decel time: 285 msec
E/e' ratio: 12.92
FS: 23 % — AB (ref 28–44)
HEIGHTINCHES: 66 in
IVS/LV PW RATIO, ED: 1.02
LA diam end sys: 39 mm
LA diam index: 2.13 cm/m2
LASIZE: 39 mm
LAVOL: 40.8 mL
LAVOLA4C: 37.4 mL
LAVOLIN: 22.3 mL/m2
LV E/e'average: 12.92
LV e' LATERAL: 5.07 cm/s
LVEEMED: 12.92
Lateral S' vel: 7.2 cm/s
MV Dec: 285
MVPKAVEL: 97 m/s
MVPKEVEL: 65.5 m/s
MVSPHT: 84 ms
PW: 10.1 mm — AB (ref 0.6–1.1)
TAPSE: 16.7 mm
TDI e' lateral: 5.07
TDI e' medial: 4.23
WEIGHTICAEL: 2504.43 [oz_av]

## 2017-01-19 LAB — GLUCOSE, CAPILLARY
GLUCOSE-CAPILLARY: 104 mg/dL — AB (ref 65–99)
GLUCOSE-CAPILLARY: 67 mg/dL (ref 65–99)
GLUCOSE-CAPILLARY: 121 mg/dL — AB (ref 65–99)
Glucose-Capillary: 111 mg/dL — ABNORMAL HIGH (ref 65–99)
Glucose-Capillary: 92 mg/dL (ref 65–99)
Glucose-Capillary: 99 mg/dL (ref 65–99)
Glucose-Capillary: 99 mg/dL (ref 65–99)

## 2017-01-19 LAB — MAGNESIUM
MAGNESIUM: 1.9 mg/dL (ref 1.7–2.4)
Magnesium: 1.9 mg/dL (ref 1.7–2.4)

## 2017-01-19 LAB — PHOSPHORUS
Phosphorus: 2.8 mg/dL (ref 2.5–4.6)
Phosphorus: 2.8 mg/dL (ref 2.5–4.6)

## 2017-01-19 LAB — CALCIUM, IONIZED: Calcium, Ionized, Serum: 3.5 mg/dL — ABNORMAL LOW (ref 4.5–5.6)

## 2017-01-19 LAB — LACTIC ACID, PLASMA: Lactic Acid, Venous: 3.3 mmol/L (ref 0.5–1.9)

## 2017-01-19 MED ORDER — SODIUM CHLORIDE 0.9 % IV SOLN
2.0000 g | Freq: Once | INTRAVENOUS | Status: AC
  Administered 2017-01-19: 2 g via INTRAVENOUS
  Filled 2017-01-19: qty 20

## 2017-01-19 MED ORDER — SODIUM CHLORIDE 0.9 % IV BOLUS (SEPSIS)
500.0000 mL | Freq: Once | INTRAVENOUS | Status: AC
Start: 2017-01-19 — End: 2017-01-19
  Administered 2017-01-19: 500 mL via INTRAVENOUS

## 2017-01-19 MED ORDER — SODIUM CHLORIDE 0.9 % IV SOLN
2.0000 g | Freq: Once | INTRAVENOUS | Status: AC
  Administered 2017-01-20: 2 g via INTRAVENOUS
  Filled 2017-01-19: qty 20

## 2017-01-19 MED ORDER — ALBUMIN HUMAN 25 % IV SOLN
12.5000 g | Freq: Four times a day (QID) | INTRAVENOUS | Status: AC
  Administered 2017-01-19 – 2017-01-20 (×4): 12.5 g via INTRAVENOUS
  Filled 2017-01-19 (×5): qty 50

## 2017-01-19 MED ORDER — CALCIUM GLUCONATE 10 % IV SOLN
1.0000 g | Freq: Once | INTRAVENOUS | Status: AC
  Administered 2017-01-19: 1 g via INTRAVENOUS
  Filled 2017-01-19: qty 10

## 2017-01-19 NOTE — Progress Notes (Signed)
Unable to perform CIWA scale due to sedated and intubation. Will continue to monitor patient.

## 2017-01-19 NOTE — Progress Notes (Signed)
[facility] for electrolyte management   Pharmacy consulted for electrolyte management for 76 yo female admitted to the ICU with sepsis. Patient has history significant for alcohol abuse and is on CIWA.   Patient ordered D5-1/2NS @ 82mL/hr. Patient ordered Lactulose BID.   Plan:  Electrolytes WNL this morning (corrected Ca: 8.5). No supplementation required.  Recheck electrolytes with AM labs   Allergies  Allergen Reactions  . Other Itching    "mycin" doesn't know which one  . Penicillins Swelling    Has patient had a PCN reaction causing immediate rash, facial/tongue/throat swelling, SOB or lightheadedness with hypotension: Yes Has patient had a PCN reaction causing severe rash involving mucus membranes or skin necrosis: No Has patient had a PCN reaction that required hospitalization: No Has patient had a PCN reaction occurring within the last 10 years: No If all of the above answers are "NO", then may proceed with Cephalosporin use.    Patient Measurements: Height: 5\' 6"  (167.6 cm) Weight: 156 lb 8.4 oz (71 kg) IBW/kg (Calculated) : 59.3  Vital Signs: Temp: 97.2 F (36.2 C) (10/14 0852) Temp Source: Core (Comment) (10/14 0800) BP: 140/89 (10/14 0800) Pulse Rate: 58 (10/14 0852) Intake/Output from previous day: 10/13 0701 - 10/14 0700 In: 3086.7 [I.V.:1806.7; NG/GT:120; IV Piggyback:1160] Out: 255 [Urine:255] Intake/Output from this shift: Total I/O In: 513.9 [I.V.:513.9] Out: 20 [Urine:20]  Labs:  Recent Labs  01/17/17 1225 01/17/17 1806 01/18/17 0421 01/19/17 0027 01/19/17 0512  WBC 14.1* 14.2* 14.7*  --   --   HGB 10.6* 10.4* 9.9*  --   --   HCT 31.3* 30.4* 29.2*  --   --   PLT 20* 16* 11*  --   --   CREATININE  --  0.73 0.75 0.73 0.81  MG  --   --  2.1 1.9 1.9  PHOS  --   --  1.8* 2.8 2.8   Estimated Creatinine Clearance: 56.2 mL/min (by C-G formula based on SCr of 0.81 mg/dL).    Medical History: Past  Medical History:  Diagnosis Date  . Arthritis   . Back pain   . Insomnia   . Medical history non-contributory   . Osteoporosis     Pharmacy will continue to monitor and adjust per consult.   Arius Harnois C 01/19/2017,9:11 AM

## 2017-01-19 NOTE — Progress Notes (Signed)
Patient ID: Sonya Park, female   DOB: 02/23/1941, 76 y.o.   MRN: [id number]   [facility] PROGRESS NOTE  Sonya Park:[id number] DOB: [birth year] DOA: 01/14/2017 PCP: Cletis Athens, MD  HPI/Subjective: Patient intubated and off sedation to assess mental status.  Patient flex both of her arms to sternal rub.  Objective: Vitals:   01/19/17 1116 01/19/17 1200  BP:    Pulse: 86 77  Resp: 10 13  Temp: 98.6 F (37 C) 99 F (37.2 C)  SpO2: 100% 100%    Filed Weights   01/14/17 1456 01/15/17 1530  Weight: 63 kg (139 lb) 71 kg (156 lb 8.4 oz)    ROS: Review of Systems  Unable to perform ROS: Acuity of condition   Exam: Physical Exam  Constitutional: She is intubated.  HENT:  Nose: No mucosal edema.  Eyes: Pupils are equal, round, and reactive to light.  Icteric.   Neck: Carotid bruit is not present. No thyromegaly present.  Cardiovascular: Regular rhythm, S1 normal and S2 normal.   Respiratory: She is intubated. She has decreased breath sounds in the right lower field and the left lower field. She has no wheezes. She has no rhonchi. She has no rales.  GI: Soft. Bowel sounds are normal. There is no tenderness.  Musculoskeletal:       Right elbow: She exhibits swelling.       Left elbow: She exhibits swelling.       Right wrist: She exhibits swelling.       Left wrist: She exhibits tenderness.       Right knee: She exhibits swelling.       Left knee: She exhibits swelling.       Right ankle: She exhibits swelling.       Left ankle: She exhibits swelling.  Neurological:  Patient intubated and sedated  Skin:  Large ulceration left posterior calf. Demarcation of the toes with a dusky look. Bruising upper extremities and lower extremities.  Bruising around bilateral eyelids.  Psychiatric:  Patient intubated and sedated      Data Reviewed: Basic Metabolic Panel:  Recent Labs Lab 01/15/17 1113  01/15/17 1821  01/17/17 0430 01/17/17 1806 01/18/17 0421  01/19/17 0027 01/19/17 0512  NA 135  < > 136  < > 138 136 135 132* 134*  K 3.9  < > 3.9  < > 3.7 3.3* 3.5 3.8 4.2  CL 107  < > 108  < > 104 102 101 102 100*  CO2 11*  < > 12*  < > '23 24 25 22 23  '$ GLUCOSE 122*  < > 120*  < > 117* 124* 120* 106* 102*  BUN 12  < > 12  < > '14 15 17 18 19  '$ CREATININE 1.18*  < > 1.19*  < > 0.77 0.73 0.75 0.73 0.81  CALCIUM 6.0*  < > 5.9*  < > 5.4* 6.3* 6.1* 6.1* 6.7*  MG  --   --  2.3  --  1.8  --  2.1 1.9 1.9  PHOS 2.9  --   --   --  2.1*  --  1.8* 2.8 2.8  < > = values in this interval not displayed. Liver Function Tests:  Recent Labs Lab 01/14/17 2008 01/15/17 1113 01/16/17 0421  AST 137*  --  182*  ALT 61*  --  50  ALKPHOS 126  --  89  BILITOT 3.8*  --  3.3*  PROT 4.9*  --  3.7*  ALBUMIN 2.5* 1.5* 1.7*    Recent Labs Lab 01/17/17 1225 01/18/17 0421  AMMONIA 52* 54*   CBC:  Recent Labs Lab 01/14/17 2008 01/15/17 0330 01/15/17 1113 01/16/17 0421 01/17/17 1225 01/17/17 1806 01/18/17 0421  WBC 1.9* 0.8*  --  7.6 14.1* 14.2* 14.7*  NEUTROABS 1.3*  --   --   --  13.0* 13.0* 13.4*  HGB 10.8* 8.5* 9.4* 10.6* 10.6* 10.4* 9.9*  HCT 31.8* 25.5* 28.3* 31.4* 31.3* 30.4* 29.2*  MCV 107.0* 108.3*  --  109.1* 107.3* 105.6* 105.1*  PLT 148* 84*  --  54* 20* 16* 11*   Cardiac Enzymes:  Recent Labs Lab 01/14/17 2008  CKTOTAL 569*  TROPONINI <0.03    CBG:  Recent Labs Lab 01/19/17 0040 01/19/17 0526 01/19/17 0527 01/19/17 0739 01/19/17 1211  GLUCAP 111* 67 104* 121* 99    Recent Results (from the past 240 hour(s))  Blood Culture (routine x 2)     Status: Abnormal   Collection Time: 01/14/17 11:08 PM  Result Value Ref Range Status   Specimen Description BLOOD LFOA  Final   Special Requests   Final    BOTTLES DRAWN AEROBIC AND ANAEROBIC Blood Culture results may not be optimal due to an excessive volume of blood received in culture bottles   Culture  Setup Time   Final    GRAM NEGATIVE RODS IN BOTH AEROBIC AND ANAEROBIC  BOTTLES CRITICAL RESULT CALLED TO, READ BACK BY AND VERIFIED WITH:  MICHAEL SIMPSON AT 1207 01/15/17 SDR    Culture ESCHERICHIA COLI (A)  Final   Report Status 01/17/2017 FINAL  Final   Organism ID, Bacteria ESCHERICHIA COLI  Final      Susceptibility   Escherichia coli - MIC*    AMPICILLIN >=32 RESISTANT Resistant     CEFAZOLIN <=4 SENSITIVE Sensitive     CEFEPIME <=1 SENSITIVE Sensitive     CEFTAZIDIME <=1 SENSITIVE Sensitive     CEFTRIAXONE <=1 SENSITIVE Sensitive     CIPROFLOXACIN 1 SENSITIVE Sensitive     GENTAMICIN <=1 SENSITIVE Sensitive     IMIPENEM <=0.25 SENSITIVE Sensitive     TRIMETH/SULFA >=320 RESISTANT Resistant     AMPICILLIN/SULBACTAM >=32 RESISTANT Resistant     PIP/TAZO <=4 SENSITIVE Sensitive     Extended ESBL NEGATIVE Sensitive     * ESCHERICHIA COLI  Blood Culture (routine x 2)     Status: Abnormal   Collection Time: 01/14/17 11:08 PM  Result Value Ref Range Status   Specimen Description BLOOD LAC  Final   Special Requests   Final    BOTTLES DRAWN AEROBIC AND ANAEROBIC Blood Culture results may not be optimal due to an excessive volume of blood received in culture bottles   Culture  Setup Time   Final    GRAM NEGATIVE RODS IN BOTH AEROBIC AND ANAEROBIC BOTTLES CRITICAL VALUE NOTED.  VALUE IS CONSISTENT WITH PREVIOUSLY REPORTED AND CALLED VALUE.    Culture (A)  Final    ESCHERICHIA COLI SUSCEPTIBILITIES PERFORMED ON PREVIOUS CULTURE WITHIN THE LAST 5 DAYS. Performed at [facility], [address] [place], [place] [address]    Report Status 01/17/2017 FINAL  Final  Urine culture     Status: Abnormal   Collection Time: 01/14/17 11:08 PM  Result Value Ref Range Status   Specimen Description URINE, RANDOM  Final   Special Requests NONE  Final   Culture (A)  Final    <10,000 COLONIES/mL INSIGNIFICANT GROWTH Performed at [facility], [address], [address], [place] [address]    Report Status 01/16/2017 FINAL  Final  Blood Culture ID  Panel (Reflexed)     Status: Abnormal   Collection Time: 01/14/17 11:08 PM  Result Value Ref Range Status   Enterococcus species NOT DETECTED NOT DETECTED Final   Listeria monocytogenes NOT DETECTED NOT DETECTED Final   Staphylococcus species NOT DETECTED NOT DETECTED Final   Staphylococcus aureus NOT DETECTED NOT DETECTED Final   Streptococcus species NOT DETECTED NOT DETECTED Final   Streptococcus agalactiae NOT DETECTED NOT DETECTED Final   Streptococcus pneumoniae NOT DETECTED NOT DETECTED Final   Streptococcus pyogenes NOT DETECTED NOT DETECTED Final   Acinetobacter baumannii NOT DETECTED NOT DETECTED Final   Enterobacteriaceae species DETECTED (A) NOT DETECTED Final    Comment: Enterobacteriaceae represent a large family of gram-negative bacteria, not a single organism. CRITICAL RESULT CALLED TO, READ BACK BY AND VERIFIED WITH:  MICHAEL SIMPSON AT 1207 01/15/17 SDR    Enterobacter cloacae complex NOT DETECTED NOT DETECTED Final   Escherichia coli DETECTED (A) NOT DETECTED Final    Comment: CRITICAL RESULT CALLED TO, READ BACK BY AND VERIFIED WITH:  MICHAEL SIMPSON AT 1207 01/15/17 SDR    Klebsiella oxytoca NOT DETECTED NOT DETECTED Final   Klebsiella pneumoniae NOT DETECTED NOT DETECTED Final   Proteus species NOT DETECTED NOT DETECTED Final   Serratia marcescens NOT DETECTED NOT DETECTED Final   Carbapenem resistance NOT DETECTED NOT DETECTED Final   Haemophilus influenzae NOT DETECTED NOT DETECTED Final   Neisseria meningitidis NOT DETECTED NOT DETECTED Final   Pseudomonas aeruginosa NOT DETECTED NOT DETECTED Final   Candida albicans NOT DETECTED NOT DETECTED Final   Candida glabrata NOT DETECTED NOT DETECTED Final   Candida krusei NOT DETECTED NOT DETECTED Final   Candida parapsilosis NOT DETECTED NOT DETECTED Final   Candida tropicalis NOT DETECTED NOT DETECTED Final     Studies: Dg Abd 1 View  Result Date: 01/17/2017 CLINICAL DATA:  Placement of orogastric tube  EXAM: ABDOMEN - 1 VIEW COMPARISON:  Radiographs 10//10/18 FINDINGS: NG tube extends the stomach.  Side port below the GE junction. IMPRESSION: NG tube in stomach.  Side port below the GE junction Electronically Signed   By: Suzy Bouchard M.D.   On: 01/17/2017 21:25    Scheduled Meds: . chlorhexidine gluconate (MEDLINE KIT)  15 mL Mouth Rinse BID  . EPINEPHrine  1 mg Intravenous Once  . folic acid  1 mg Intravenous Daily  . hydrocortisone sod succinate (SOLU-CORTEF) inj  50 mg Intravenous Q6H  . lactulose  20 g Oral BID  . mouth rinse  15 mL Mouth Rinse 10 times per day  . pantoprazole (PROTONIX) IV  40 mg Intravenous Q24H  . rocuronium  1 mg/kg Intravenous Once  . sodium chloride flush  10-40 mL Intracatheter Q12H  . thiamine injection  100 mg Intravenous Daily   Continuous Infusions: . albumin human    . ciprofloxacin Stopped (01/19/17 1112)  . dextrose 5 % and 0.45 % NaCl with KCl 20 mEq/L 75 mL/hr at 01/19/17 0102  . fentaNYL infusion INTRAVENOUS Stopped (01/19/17 1116)  . norepinephrine (LEVOPHED) Adult infusion 12 mcg/min (01/19/17 1116)  . phenylephrine (NEO-SYNEPHRINE) Adult infusion Stopped (01/17/17 1500)  . vasopressin (PITRESSIN) infusion - *FOR SHOCK* 0.03 Units/min (01/18/17 1800)    Assessment/Plan:  1. Septic shock with Escherichia coli. Patient currently on 2 pressors to maintain blood pressure. Patient on stress dose steroids.  Patient on IV Cipro. 2.  Acute respiratory failure with hypoxia. Patient currently on 35% FiO2. Weaning sedation to assess mental status and respiratory status. 3. Alcohol abuse. Patient currently on the ventilator. 4. Acute encephalopathy secondary to severe sepsis. 5. Pancytopenia could be secondary to alcohol abuse.  Could also possibly be secondary to DIC with the drop in platelets.  Patient with severe thrombocytopenia. 6. Hypomagnesemia, hypocalcemia and hypokalemia. ICU electrolyte replacement protocol 7. Severe malnutrition, Anasarca  and third spacing of fluids. albumin ordered by critical care specialist. 8. Jaundice likely secondary to shock  Code Status:     Code Status Orders        Start     Ordered   01/15/17 0158  Full code  Continuous     01/15/17 0157    Code Status History    Date Active Date Inactive Code Status Order ID Comments User Context   12/13/2015 11:49 AM 12/15/2015  7:28 PM Full Code [id number]  Dereck Leep, MD Inpatient   09/17/2014  6:01 AM 09/18/2014  3:51 PM Full Code [id number]  Juluis Mire, MD Inpatient     Family Communication: spoke with husband at the bedside Disposition Plan: requiring ICU care  Consultants:  Critical care specialist  Antibiotics:  Cipro  Time spent: 26 minutes  Loletha Grayer  [employer]

## 2017-01-19 NOTE — Progress Notes (Signed)
PULMONARY / CRITICAL CARE MEDICINE   Name: Sonya Park MRN: [id number] DOB: 16-Oct-1940     SUBJECTIVE:  Pt remains intubated. WEANING VASOPRESSORS.    VITAL SIGNS: BP 140/89   Pulse 75   Temp 98.4 F (36.9 C)   Resp 11   Ht 5\' 6"  (1.676 m)   Wt 156 lb 8.4 oz (71 kg)   SpO2 100%   BMI 25.26 kg/m   HEMODYNAMICS:    VENTILATOR SETTINGS: Vent Mode: SIMV FiO2 (%):  [35 %] 35 % Set Rate:  [10 bmp-20 bmp] 10 bmp Vt Set:  [450 mL] 450 mL PEEP:  [5 cmH20] 5 cmH20 Pressure Support:  [10 cmH20] 10 cmH20 Plateau Pressure:  [19 cmH20] 19 cmH20  INTAKE / OUTPUT: I/O last 3 completed shifts: In: 5051.2 [I.V.:3321.2; NG/GT:120; IV Piggyback:1610] Out: 435 [Urine:335; Stool:100]  PHYSICAL EXAMINATION: General: acutely ill appearing Caucasian female, NAD  Neuro: sedated, withdraws from painful stimulation not following commands, PERRL  HEENT: diffuse ecchymosis of forehead, nasal bridge, and bilateral periorbital tissue  Cardiovascular: sinus tach, s1s2, no M/R/G Lungs: diminished throughout, even, non labored mechanically intubated  Abdomen: hypoactive BS x4, soft, non tender, non distended  Musculoskeletal: 2+ bilateral lower and upper extremity edema weeping throughout, moves all extremities  Skin: abrasion left lower extremity with erythema, cool to touch and weeping, bilateral feet cyanotic   LABS:  BMET  Recent Labs Lab 01/19/17 0027 01/19/17 0512 01/19/17 1751  NA 132* 134* 134*  K 3.8 4.2 4.5  CL 102 100* 102  CO2 22 23 25   BUN 18 19 21*  CREATININE 0.73 0.81 0.89  GLUCOSE 106* 102* 100*    Electrolytes  Recent Labs Lab 01/18/17 0421 01/19/17 0027 01/19/17 0512 01/19/17 1751  CALCIUM 6.1* 6.1* 6.7* 6.8*  MG 2.1 1.9 1.9  --   PHOS 1.8* 2.8 2.8  --     CBC  Recent Labs Lab 01/17/17 1225 01/17/17 1806 01/18/17 0421  WBC 14.1* 14.2* 14.7*  HGB 10.6* 10.4* 9.9*  HCT 31.3* 30.4* 29.2*  PLT 20* 16* 11*    Coag's  Recent Labs Lab  01/14/17 2008  INR 1.29    Sepsis Markers  Recent Labs Lab 01/15/17 0330 01/16/17 0421  01/16/17 2225 01/17/17 0430 01/17/17 1519 01/19/17 1237  LATICACIDVEN 13.0*  --   < > 9.7*  --  6.7* 3.3*  PROCALCITON 4.64 8.27  --   --  5.36  --   --   < > = values in this interval not displayed.  ABG  Recent Labs Lab 01/16/17 1030 01/17/17 1117 01/18/17 0430  PHART 7.50* 7.51* 7.52*  PCO2ART 25* 30* 29*  PO2ART 103 79* 92    Liver Enzymes  Recent Labs Lab 01/14/17 2008 01/15/17 1113 01/16/17 0421  AST 137*  --  182*  ALT 61*  --  50  ALKPHOS 126  --  89  BILITOT 3.8*  --  3.3*  ALBUMIN 2.5* 1.5* 1.7*    Cardiac Enzymes  Recent Labs Lab 01/14/17 2008  TROPONINI <0.03    Glucose  Recent Labs Lab 01/19/17 0526 01/19/17 0527 01/19/17 0739 01/19/17 1211 01/19/17 1623 01/19/17 1951  GLUCAP 67 104* 121* 99 92 99    Imaging No results found. STUDIES:  CT Abd, Pelvis, and Chest 10/9>>Age-indeterminate T12 inferior endplate compression deformity. Progressive chronic inferior endplate deformity involves the L3 vertebra. No evidence for solid organ or hollow viscus injury. Small pulmonary nodules measure up to 4 mm. Nonspecific. No follow-up needed  if patient is low-risk (and has no known or suspected primary neoplasm). Non-contrast chest CT can be considered in 12 months if patient is high-risk.  Aortic Atherosclerosis (ICD10-I70.0). Lad coronary artery calcification noted. Hepatic steatosis. CT Maxillofacial 10/9>>Bilateral nasal bone fractures without significant displacement. Left frontal hemorrhage suggest an occult fracture. No discrete fracture is visualized. Extensive bilateral periorbital soft swelling and hematoma with inflammatory changes extending over the maxilla bilaterally, left greater than right. CT Head 10/9>>Periorbital and frontal scalp soft tissue thickening, without acute intracranial abnormality. New hyperattenuating material in the left  frontal sinus could represent hemorrhage. No acute fracture is seen. If this is a clinical concern, consider dedicated face CT. Osseous irregularity about both nasal bones is felt to be grossly similar back to 10/16/2012. Correlate with clinical exam to exclude acute superimposed injury. Cerebral atrophy and small vessel ischemic change  CULTURES: Blood 10/9>>enterobacteriaceae and ecoli  Urine 10/9>>negative Fungus 10/10>>  ANTIBIOTICS: Vancomycin 10/9 x1 dose  Levaquin 10/9 x1 dose  Aztreonam 10/9>>10/10 Meropenem 10/9>>10/12 Diflucan 10/10>> Ciprofloxacin 10/12>> Cefazolin 10/12>>  SIGNIFICANT EVENTS: 10/9-Pt admitted to ICU  10/10-Pt mechanically intubated due to severe metabolic acidosis and encephalopathy   LINES/TUBES: Right IJ 10/9>> ETT 10/10>> Right femoral a line 10/10>>  ASSESSMENT / PLAN:  PULMONARY A: Mechanical Intubation due to metabolic acidosis and encephalopathy  P:   Weaning as tolerated.   CARDIOVASCULAR A:  Severe septic shock with hypotension  P:  Wean vasopressors to maintain map >65. On cipro.  RENAL A:   Metabolic Acidosis  Hypocalcemia  Mild Rhabdomyolysis  Severe Lactic Acidosis  P:   Trend BMP Replace electrolytes as indicated  Monitor UOP  GASTROINTESTINAL A:   Transaminitis Hyperbilirubinemia P:   Trend hepatic panel  OG tube to LIS  Keep NPO for now still requiring multiple vasopressors   Protonix for SUP   HEMATOLOGIC A: Left frontal hemorrhage concerning for occult fracture and extensive bilateral periorbital soft swelling and hematoma s/p fall  Anemia with acute blood loss-improving   Thrombocytopenia  Neutropenia  P:  Trend CBC  SCD's for VTE prophylaxis, avoid chemical prophylaxis for now    INFECTIOUS A:   Bacteremia-blood cultures positive enterobacteriaceae and ecoli 10/9 Suspected left lower extremity cellulitis Recent exposure to mold  P:   On cipro,  ENDOCRINE Glycemic control.  NEUROLOGIC A:    Metabolic encephalopathy  ETOH abuse  P:   RASS goal: 0 to -1 Fentanyl gtt for pain management and to maintain RASS goal  Will add thiamine and folic acid daily  CIWA protocol   FAMILY  - Updates: Husband updated.  Cc: 32 minutes    Dimas Chyle MD PCCM

## 2017-01-20 ENCOUNTER — Inpatient Hospital Stay: Payer: Medicare Other

## 2017-01-20 LAB — COMPREHENSIVE METABOLIC PANEL
ALBUMIN: 1.8 g/dL — AB (ref 3.5–5.0)
ALK PHOS: 108 U/L (ref 38–126)
ALT: 41 U/L (ref 14–54)
AST: 147 U/L — AB (ref 15–41)
Anion gap: 9 (ref 5–15)
BUN: 23 mg/dL — AB (ref 6–20)
CALCIUM: 7 mg/dL — AB (ref 8.9–10.3)
CO2: 25 mmol/L (ref 22–32)
CREATININE: 0.78 mg/dL (ref 0.44–1.00)
Chloride: 101 mmol/L (ref 101–111)
GFR calc non Af Amer: >60 mL/min (ref >60)
GLUCOSE: 120 mg/dL — AB (ref 65–99)
Potassium: 4.6 mmol/L (ref 3.5–5.1)
SODIUM: 135 mmol/L (ref 135–145)
Total Bilirubin: 10.4 mg/dL — ABNORMAL HIGH (ref 0.3–1.2)
Total Protein: 3.6 g/dL — ABNORMAL LOW (ref 6.5–8.1)

## 2017-01-20 LAB — CBC
HCT: 23 % — ABNORMAL LOW (ref 35.0–47.0)
Hemoglobin: 7.6 g/dL — ABNORMAL LOW (ref 12.0–16.0)
MCH: 35.6 pg — AB (ref 26.0–34.0)
MCHC: 33.2 g/dL (ref 32.0–36.0)
MCV: 107.3 fL — ABNORMAL HIGH (ref 80.0–100.0)
PLATELETS: 14 10*3/uL — AB (ref 150–440)
RBC: 2.15 MIL/uL — ABNORMAL LOW (ref 3.80–5.20)
RDW: 16.8 % — AB (ref 11.5–14.5)
WBC: 18.9 10*3/uL — ABNORMAL HIGH (ref 3.6–11.0)

## 2017-01-20 LAB — GLUCOSE, CAPILLARY
GLUCOSE-CAPILLARY: 133 mg/dL — AB (ref 65–99)
GLUCOSE-CAPILLARY: 156 mg/dL — AB (ref 65–99)
GLUCOSE-CAPILLARY: 88 mg/dL (ref 65–99)
Glucose-Capillary: 124 mg/dL — ABNORMAL HIGH (ref 65–99)
Glucose-Capillary: 106 mg/dL — ABNORMAL HIGH (ref 65–99)
Glucose-Capillary: 87 mg/dL (ref 65–99)

## 2017-01-20 LAB — BLOOD GAS, ARTERIAL
ACID-BASE EXCESS: 1.3 mmol/L (ref 0.0–2.0)
BICARBONATE: 23.9 mmol/L (ref 20.0–28.0)
FIO2: 0.35
LHR: 20 {breaths}/min
O2 SAT: 96.7 %
PATIENT TEMPERATURE: 37
PCO2 ART: 30 mmHg — AB (ref 32.0–48.0)
PEEP/CPAP: 5 cmH2O
PH ART: 7.51 — AB (ref 7.350–7.450)
VT: 450 mL
pO2, Arterial: 79 mmHg — ABNORMAL LOW (ref 83.0–108.0)

## 2017-01-20 LAB — PREALBUMIN: Prealbumin: <5 mg/dL — ABNORMAL LOW (ref 18–38)

## 2017-01-20 LAB — PHOSPHORUS: Phosphorus: 3.6 mg/dL (ref 2.5–4.6)

## 2017-01-20 LAB — MAGNESIUM: Magnesium: 1.8 mg/dL (ref 1.7–2.4)

## 2017-01-20 LAB — MRSA PCR SCREENING: MRSA BY PCR: NEGATIVE

## 2017-01-20 MED ORDER — VITAMIN A 10000 UNITS PO CAPS
20000.0000 [IU] | ORAL_CAPSULE | Freq: Every day | ORAL | Status: DC
  Administered 2017-01-22 – 2017-01-26 (×4): 20000 [IU] via ORAL
  Filled 2017-01-20 (×11): qty 2

## 2017-01-20 MED ORDER — PRO-STAT SUGAR FREE PO LIQD: 30.0000 mL | Freq: Two times a day (BID) | ORAL | Status: DC

## 2017-01-20 MED ORDER — DEXMEDETOMIDINE HCL IN NACL 400 MCG/100ML IV SOLN
0.4000 ug/kg/h | INTRAVENOUS | Status: DC
  Administered 2017-01-20: 0.4 ug/kg/h via INTRAVENOUS
  Administered 2017-01-21: 0.7 ug/kg/h via INTRAVENOUS
  Filled 2017-01-20 (×2): qty 100

## 2017-01-20 MED ORDER — HYDROCORTISONE NA SUCCINATE PF 100 MG IJ SOLR
50.0000 mg | Freq: Two times a day (BID) | INTRAMUSCULAR | Status: DC
  Administered 2017-01-20 – 2017-01-21 (×2): 50 mg via INTRAVENOUS
  Filled 2017-01-20 (×2): qty 2

## 2017-01-20 MED ORDER — FENTANYL CITRATE (PF) 100 MCG/2ML IJ SOLN
50.0000 ug | INTRAMUSCULAR | Status: DC | PRN
  Administered 2017-01-20 – 2017-01-21 (×4): 50 ug via INTRAVENOUS
  Filled 2017-01-20 (×4): qty 2

## 2017-01-20 MED ORDER — MIDAZOLAM HCL 2 MG/2ML IJ SOLN
2.0000 mg | INTRAMUSCULAR | Status: DC | PRN
  Administered 2017-01-20 (×3): 2 mg via INTRAVENOUS
  Filled 2017-01-20 (×3): qty 2

## 2017-01-20 MED ORDER — VITAL HIGH PROTEIN PO LIQD: 1000.0000 mL | ORAL | Status: DC

## 2017-01-20 MED ORDER — PHYTONADIONE 5 MG PO TABS
2.5000 mg | ORAL_TABLET | Freq: Every day | ORAL | Status: DC
  Administered 2017-01-20: 2.5 mg via ORAL
  Filled 2017-01-20 (×2): qty 1

## 2017-01-20 MED ORDER — VITAL AF 1.2 CAL PO LIQD
1000.0000 mL | ORAL | Status: DC
  Administered 2017-01-20: 1000 mL

## 2017-01-20 MED ORDER — ZINC SULFATE 220 (50 ZN) MG PO CAPS
220.0000 mg | ORAL_CAPSULE | Freq: Every day | ORAL | Status: DC
  Administered 2017-01-20 – 2017-01-31 (×12): 220 mg
  Filled 2017-01-20 (×12): qty 1

## 2017-01-20 NOTE — Progress Notes (Signed)
Pharmacy Antibiotic Note  Sonya Park is a 76 y.o. female admitted on 01/14/2017 with sepsis and e coli bacteremia.  Pharmacy has been consulted for ciprofloxacin dosing.  Plan: Continue ciprofloxacin 400 mg iv q 12 hours.    Height: 5\' 6"  (167.6 cm) Weight: 156 lb 8.4 oz (71 kg) IBW/kg (Calculated) : 59.3  Temp (24hrs), Avg:97.4 F (36.3 C), Min:96.1 F (35.6 C), Max:99 F (37.2 C)   Recent Labs Lab 01/15/17 0330  01/16/17 0421  01/16/17 1106 01/16/17 2225  01/17/17 1225 01/17/17 1519 01/17/17 1806 01/18/17 0421 01/19/17 0027 01/19/17 0512 01/19/17 1237 01/19/17 1751 01/20/17 0503 01/20/17 0508  WBC 0.8*  --  7.6  --   --   --   --  14.1*  --  14.2* 14.7*  --   --   --   --  18.9*  --   CREATININE 1.22*  < > 1.01*  < >  --   --   < >  --   --  0.73 0.75 0.73 0.81  --  0.89  --  0.78  LATICACIDVEN 13.0*  --   --   --  10.5* 9.7*  --   --  6.7*  --   --   --   --  3.3*  --   --   --   < > = values in this interval not displayed.  Estimated Creatinine Clearance: 56.9 mL/min (by C-G formula based on SCr of 0.78 mg/dL).    Allergies  Allergen Reactions  . Other Itching    "mycin" doesn't know which one  . Penicillins Swelling    Has patient had a PCN reaction causing immediate rash, facial/tongue/throat swelling, SOB or lightheadedness with hypotension: Yes Has patient had a PCN reaction causing severe rash involving mucus membranes or skin necrosis: No Has patient had a PCN reaction that required hospitalization: No Has patient had a PCN reaction occurring within the last 10 years: No If all of the above answers are "NO", then may proceed with Cephalosporin use.    Antimicrobials this admission:  Levofloxacin 10/9 >> 10/10 Vancomycin 10/9 >> 10/10 Fluconazole 10/10 >> 10/10 Meropenem 10/9 >> 10/11 Ciprofloxacin 10/12 >>   Dose adjustments this admission:   Microbiology results: 10/9 BCx (x2): non ESBL e coli 10/9 BCID: e coli 10/9 UCx: <10,000  colonies/ml insignificant growth 10/10 Fungus Cx: sent  Thank you for allowing pharmacy to be a part of this patient's care.  Ulice Dash D 01/20/2017 4:01 PM

## 2017-01-20 NOTE — Progress Notes (Signed)
Nutrition Follow-up  DOCUMENTATION CODES:   Not applicable  INTERVENTION:  Recommend initiating Vital AF 1.2 at 20 ml/hr and advancing by 15 ml/hr every 8 hours to goal regimen of Vital AF 1.2 at 50 ml/hr via OGT. Provides 1440 kcal, 90 grams of protein, 972 ml H2O daily.  Patient is at risk for refeeding syndrome. Recommend monitoring potassium, phosphorus, and magnesium closely for 3 days following initiation of nutrition support. Also recommend providing thiamine 100 mg for 3 days.  On recent follow-up with Dr. Duke Salvia, patient was found to be deficient in copper, zinc, vitamin K, and vitamin A. -Recommend 16 mg elemental zinc PO for 6 months (200% RDI), 2 mg/day IV copper for 6 days (followed by 1 mg PO daily until zinc supplementation is complete), 10000-25000 IU/day vitamin A orally/per tube for 1-2 weeks. -Will defer repletion of vitamin K to medical team in setting of facial trauma and left frontal hemorrhage. Per 2016 ASMBS guidelines, the recommendation is 1-2 mg/day orally/per tube or 1-2 mg/week parenterally. -Patient will need close monitoring of nutrition-related labs every 3 months.  Recommend checking daily weights.  NUTRITION DIAGNOSIS:   Inadequate oral intake related to inability to eat as evidenced by NPO status.  Ongoing - addressing with initiation of tube feeds today.  GOAL:   Provide needs based on ASPEN/SCCM guidelines  Not met - progressing with initiation of tube feeds.  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 Intubated on 10/10.   -PMT following patient and has met with husband. Patient remains full code/full scope of care at this time. -Rectal tube was placed 10/13.  Patient remains intubated. No family members at bedside during assessment.   Access: 14 Fr. OGT placed 10/10; terminates in stomach per abdominal x-ray 10/12: 55 cm at corner of mouth  Per RN MAP is around 65. Not pulling to chart for some reason.  Patient is currently intubated on ventilator support MV: 10.3 L/min Temp (24hrs), Avg:97.8 F (36.6 C), Min:96.1 F (35.6 C), Max:99.3 F (37.4 C)  Propofol: N/A  Medications reviewed and include: lactulose 20 grams BID, pantoprazole, thiamine 100 mg IV, ciprofloxacin, Levophed gtt at 3.8 ml/hr.  Labs reviewed: CBG 99-156, BUN 23.  I/O: 300 ml stool output in rectal tube  No weights since 10/10 to trend.  Discussed with RN. Also discussed on rounds.  Diet Order:     Skin:  Wound (see comment) (Stg I buttocks, weeping to arms and legs)  Last BM:  01/20/2017 - 200 ml in rectal tube  Height:   Ht Readings from Last 1 Encounters:  01/14/17 _0  (1.676 m)    Weight:   Wt Readings from Last 1 Encounters:  01/15/17 156 lb 8.4 oz (71 kg)    Ideal Body Weight:  59.1 kg  BMI:  Body mass index is 25.26 kg/m.  Estimated Nutritional Needs:   Kcal:  1401 (PSU 2003b w/ MSJ 1134, Ve 9,  Tmax 37.4)  Protein:  85-100 grams (1.4-1.6 grams/kg)  Fluid:  1.5-1.8 L/day (25-30 ml/kg)  EDUCATION NEEDS:   Education needs no appropriate at this time  Willey Blade, [place], [place], [place] Office: [phone] Pager: [phone] After Hours/Weekend Pager: [phone]

## 2017-01-20 NOTE — Progress Notes (Signed)
PULMONARY / CRITICAL CARE MEDICINE   Name: Sonya Park MRN: [id number] DOB: Feb 09, 1941    ADMISSION DATE:  01/14/2017  PT PROFILE: 76 y.o. F with h/o EtOH abuse admitted 01/14/17 with fall, facial contusions, severe sepsis. BCs positive for E coli. Intubated 10/10 for decreased LOC and worsening shock.   MAJOR EVENTS/TEST RESULTS: 10/09 Admitted via ED 10/09 CT head: Periorbital and frontal scalp soft tissue thickening, without acute intracranial abnormality. New hyperattenuating material in the left frontal sinus could represent hemorrhage. No acute fracture is seen  10/09 CT maxillofacial: Bilateral nasal bone fractures without significant displacement. Left frontal hemorrhage suggest an occult fracture. No discrete fracture is visualized 10/09 CTAP: Marked diffuse hepatic steatosis. No acute findings 10/09 CT chest: No acute findings 10/10 Worsening shock. Worsening level of consciousness. Intubated 10/13 echocardiogram: LVEF 45-50%. Mild concentric hypertrophy. Mild MR. Moderate TR. RA mildly dilated  INDWELLING DEVICES:: ETT 10/10 >>  R IJ CVL 10/10 >>  R femoral A-line 10/10 >>   MICRO DATA: MRSA PCR 10/15 >> NEG Urine 10/09 >> insignificant growth Blood 10/09 >> 2/2 Escherichia coli  ANTIMICROBIALS:  Vanc 10/09 >> 10/10 Aztreonam 10/09 >> 10/09 Levofloxacin 10/09 >> 10/10 Meropenem 10/09 >> 10/12 Cipro 10/12 >>    SUBJECTIVE:  Minimally responsive.   VITAL SIGNS: BP 140/89   Pulse 63   Temp (!) 97.5 F (36.4 C)   Resp 15   Ht 5\' 6"  (1.676 m)   Wt 71 kg (156 lb 8.4 oz)   SpO2 95%   BMI 25.26 kg/m   HEMODYNAMICS:    VENTILATOR SETTINGS: Vent Mode: PRVC FiO2 (%):  [35 %] 35 % Set Rate:  [10 bmp-15 bmp] 15 bmp Vt Set:  [450 mL] 450 mL PEEP:  [5 cmH20] 5 cmH20 Pressure Support:  [10 cmH20] 10 cmH20  INTAKE / OUTPUT: I/O last 3 completed shifts: In: 4381.9 [I.V.:3611.9; IV Piggyback:770] Out: 725 [Urine:395; Stool:300]  PHYSICAL  EXAMINATION: General: intubated, sedated Neuro: CNs intact, MAE HEENT: healing bilateral periorbital contusions, + sclericterus Cardiovascular: regular, no M Lungs: slightly coarse BS, no wheezes Abdomen: mildly distended, BS present, NT Ext: 3-4+ pitting pretibial edema Skin: Multiple oozing ulcerations on be LE  LABS:  BMET  Recent Labs Lab 01/19/17 0512 01/19/17 1751 01/20/17 0508  NA 134* 134* 135  K 4.2 4.5 4.6  CL 100* 102 101  CO2 23 25 25   BUN 19 21* 23*  CREATININE 0.81 0.89 0.78  GLUCOSE 102* 100* 120*    Electrolytes  Recent Labs Lab 01/19/17 0027 01/19/17 0512 01/19/17 1751 01/20/17 0503 01/20/17 0508  CALCIUM 6.1* 6.7* 6.8*  --  7.0*  MG 1.9 1.9  --  1.8  --   PHOS 2.8 2.8  --  3.6  --     CBC  Recent Labs Lab 01/17/17 1806 01/18/17 0421 01/20/17 0503  WBC 14.2* 14.7* 18.9*  HGB 10.4* 9.9* 7.6*  HCT 30.4* 29.2* 23.0*  PLT 16* 11* 14*    Coag's  Recent Labs Lab 01/14/17 2008  INR 1.29    Sepsis Markers  Recent Labs Lab 01/15/17 0330 01/16/17 0421  01/16/17 2225 01/17/17 0430 01/17/17 1519 01/19/17 1237  LATICACIDVEN 13.0*  --   < > 9.7*  --  6.7* 3.3*  PROCALCITON 4.64 8.27  --   --  5.36  --   --   < > = values in this interval not displayed.  ABG  Recent Labs Lab 01/16/17 1030 01/17/17 1117 01/18/17 0430  PHART 7.50*  7.51* 7.52*  PCO2ART 25* 30* 29*  PO2ART 103 79* 92    Liver Enzymes  Recent Labs Lab 01/14/17 2008 01/15/17 1113 01/16/17 0421 01/20/17 0508  AST 137*  --  182* 147*  ALT 61*  --  50 41  ALKPHOS 126  --  89 108  BILITOT 3.8*  --  3.3* 10.4*  ALBUMIN 2.5* 1.5* 1.7* 1.8*    Cardiac Enzymes  Recent Labs Lab 01/14/17 2008  TROPONINI <0.03    Glucose  Recent Labs Lab 01/19/17 1623 01/19/17 1951 01/20/17 0001 01/20/17 0405 01/20/17 0717 01/20/17 1128  GLUCAP 92 99 156* 124* 106* 133*    CXR: bilateral pleural effusions  ASSESSMENT / PLAN:  PULMONARY A: Acute,  ventilator dependent respiratory failure with hypoxemia Bilateral pleural effusions - likely due to generalized volume overload P:   Cont full vent support - settings reviewed and/or adjusted Cont vent bundle Daily SBT if/when meets criteria   CARDIOVASCULAR A:  Septic shock P:  Continue norepinephrine to maintain SBP >65 mmHg  RENAL A:   Severe hypervolemia due to volume resuscitation and low albumin P:   Monitor BMET intermittently Monitor I/Os Correct electrolytes as indicated   GASTROINTESTINAL A:   Elevated LFTs Severe hyperbilirubinemia P:   SUP: IV PPI Begin TF protocol 10/15  HEMATOLOGIC A:   ICU acquired anemia without overt blood loss Severe thrombocytopenia P:  DVT px: SCDs Monitor CBC intermittently Transfuse per usual guidelines   INFECTIOUS A:   Severe sepsis E coli bacteremia P:   Monitor temp, WBC count Micro and abx as above   ENDOCRINE A:   Mild, stress-induced hyperglycemia Relative adrenal insufficiency P:   Monitor glucose on chemistry panels Consider SSI for glu > 180 Continue hydrocortisone - dosed reduced 10/15  NEUROLOGIC A:   ICU/vent associated discomfort P:   RASS goal: -1, -2 PAD protocol   FAMILY: husband updated at bedside in detail with beginning of discussion regarding goals of care. For now, he wishes to continue with aggressive support and would even proceed with trach/G-tube/LTACH if needed.  CCM time: 45 mins The above time includes time spent in consultation with patient and/or family members and reviewing care plan on multidisciplinary rounds  Merton Border, MD PCCM service Mobile [phone] Pager [phone]  01/20/2017, 2:47 PM

## 2017-01-20 NOTE — Progress Notes (Signed)
Patient ID: Sonya Park, female   DOB: Sep 30, 1940, 76 y.o.   MRN: [id number]   [facility] PROGRESS NOTE  Sonya Park LFY:[id number] DOB: 1940-09-04 DOA: 01/14/2017 PCP: Cletis Athens, MD  HPI/Subjective: Patient intubated and off sedation. I was unable to wake her up earlier today  Objective: Vitals:   01/20/17 1500 01/20/17 1600  BP:    Pulse: 71 80  Resp: 15 17  Temp: (!) 97.5 F (36.4 C) 97.7 F (36.5 C)  SpO2: 96% 96%    Filed Weights   01/14/17 1456 01/15/17 1530  Weight: 63 kg (139 lb) 71 kg (156 lb 8.4 oz)    ROS: Review of Systems  Unable to perform ROS: Acuity of condition   Exam: Physical Exam  Constitutional: She is intubated.  HENT:  Nose: No mucosal edema.  Eyes: Pupils are equal, round, and reactive to light.  Icteric.   Neck: Carotid bruit is not present. No thyromegaly present.  Cardiovascular: Regular rhythm, S1 normal and S2 normal.   Respiratory: She is intubated. She has decreased breath sounds in the right lower field and the left lower field. She has no wheezes. She has no rhonchi. She has no rales.  GI: Soft. Bowel sounds are normal. There is no tenderness.  Musculoskeletal:       Right elbow: She exhibits swelling.       Left elbow: She exhibits swelling.       Right wrist: She exhibits swelling.       Left wrist: She exhibits tenderness.       Right knee: She exhibits swelling.       Left knee: She exhibits swelling.       Right ankle: She exhibits swelling.       Left ankle: She exhibits swelling.  Neurological:  Patient intubated and sedated  Skin:  Large ulceration left posterior calf. Demarcation of the toes with a dusky look. Bruising upper extremities and lower extremities.  Bruising around bilateral eyelids.  Psychiatric:  Patient intubated and sedated      Data Reviewed: Basic Metabolic Panel:  Recent Labs Lab 01/17/17 0430  01/18/17 0421 01/19/17 0027 01/19/17 0512 01/19/17 1751 01/20/17 0503  01/20/17 0508  NA 138  < > 135 132* 134* 134*  --  135  K 3.7  < > 3.5 3.8 4.2 4.5  --  4.6  CL 104  < > 101 102 100* 102  --  101  CO2 23  < > '25 22 23 25  '$ --  25  GLUCOSE 117*  < > 120* 106* 102* 100*  --  120*  BUN 14  < > '17 18 19 '$ 21*  --  23*  CREATININE 0.77  < > 0.75 0.73 0.81 0.89  --  0.78  CALCIUM 5.4*  < > 6.1* 6.1* 6.7* 6.8*  --  7.0*  MG 1.8  --  2.1 1.9 1.9  --  1.8  --   PHOS 2.1*  --  1.8* 2.8 2.8  --  3.6  --   < > = values in this interval not displayed. Liver Function Tests:  Recent Labs Lab 01/14/17 2008 01/15/17 1113 01/16/17 0421 01/20/17 0508  AST 137*  --  182* 147*  ALT 61*  --  50 41  ALKPHOS 126  --  89 108  BILITOT 3.8*  --  3.3* 10.4*  PROT 4.9*  --  3.7* 3.6*  ALBUMIN 2.5* 1.5* 1.7* 1.8*    Recent Labs Lab  01/17/17 1225 01/18/17 0421  AMMONIA 52* 54*   CBC:  Recent Labs Lab 01/14/17 2008  01/16/17 0421 01/17/17 1225 01/17/17 1806 01/18/17 0421 01/20/17 0503  WBC 1.9*  < > 7.6 14.1* 14.2* 14.7* 18.9*  NEUTROABS 1.3*  --   --  13.0* 13.0* 13.4*  --   HGB 10.8*  < > 10.6* 10.6* 10.4* 9.9* 7.6*  HCT 31.8*  < > 31.4* 31.3* 30.4* 29.2* 23.0*  MCV 107.0*  < > 109.1* 107.3* 105.6* 105.1* 107.3*  PLT 148*  < > 54* 20* 16* 11* 14*  < > = values in this interval not displayed. Cardiac Enzymes:  Recent Labs Lab 01/14/17 2008  CKTOTAL 569*  TROPONINI <0.03    CBG:  Recent Labs Lab 01/19/17 1951 01/20/17 0001 01/20/17 0405 01/20/17 0717 01/20/17 1128  GLUCAP 99 156* 124* 106* 133*    Recent Results (from the past 240 hour(s))  Blood Culture (routine x 2)     Status: Abnormal   Collection Time: 01/14/17 11:08 PM  Result Value Ref Range Status   Specimen Description BLOOD LFOA  Final   Special Requests   Final    BOTTLES DRAWN AEROBIC AND ANAEROBIC Blood Culture results may not be optimal due to an excessive volume of blood received in culture bottles   Culture  Setup Time   Final    GRAM NEGATIVE RODS IN BOTH AEROBIC AND  ANAEROBIC BOTTLES CRITICAL RESULT CALLED TO, READ BACK BY AND VERIFIED WITH:  MICHAEL SIMPSON AT 1207 01/15/17 SDR    Culture ESCHERICHIA COLI (A)  Final   Report Status 01/17/2017 FINAL  Final   Organism ID, Bacteria ESCHERICHIA COLI  Final      Susceptibility   Escherichia coli - MIC*    AMPICILLIN >=32 RESISTANT Resistant     CEFAZOLIN <=4 SENSITIVE Sensitive     CEFEPIME <=1 SENSITIVE Sensitive     CEFTAZIDIME <=1 SENSITIVE Sensitive     CEFTRIAXONE <=1 SENSITIVE Sensitive     CIPROFLOXACIN 1 SENSITIVE Sensitive     GENTAMICIN <=1 SENSITIVE Sensitive     IMIPENEM <=0.25 SENSITIVE Sensitive     TRIMETH/SULFA >=320 RESISTANT Resistant     AMPICILLIN/SULBACTAM >=32 RESISTANT Resistant     PIP/TAZO <=4 SENSITIVE Sensitive     Extended ESBL NEGATIVE Sensitive     * ESCHERICHIA COLI  Blood Culture (routine x 2)     Status: Abnormal   Collection Time: 01/14/17 11:08 PM  Result Value Ref Range Status   Specimen Description BLOOD LAC  Final   Special Requests   Final    BOTTLES DRAWN AEROBIC AND ANAEROBIC Blood Culture results may not be optimal due to an excessive volume of blood received in culture bottles   Culture  Setup Time   Final    GRAM NEGATIVE RODS IN BOTH AEROBIC AND ANAEROBIC BOTTLES CRITICAL VALUE NOTED.  VALUE IS CONSISTENT WITH PREVIOUSLY REPORTED AND CALLED VALUE.    Culture (A)  Final    ESCHERICHIA COLI SUSCEPTIBILITIES PERFORMED ON PREVIOUS CULTURE WITHIN THE LAST 5 DAYS. Performed at [facility], [place] [address], [place],  [address]    Report Status 01/17/2017 FINAL  Final  Urine culture     Status: Abnormal   Collection Time: 01/14/17 11:08 PM  Result Value Ref Range Status   Specimen Description URINE, RANDOM  Final   Special Requests NONE  Final   Culture (A)  Final    <10,000 COLONIES/mL INSIGNIFICANT GROWTH Performed at [facility], [place] [address], [place], [place] [address]    Report Status 01/16/2017 FINAL  Final  Blood  Culture ID Panel (Reflexed)     Status: Abnormal   Collection Time: 01/14/17 11:08 PM  Result Value Ref Range Status   Enterococcus species NOT DETECTED NOT DETECTED Final   Listeria monocytogenes NOT DETECTED NOT DETECTED Final   Staphylococcus species NOT DETECTED NOT DETECTED Final   Staphylococcus aureus NOT DETECTED NOT DETECTED Final   Streptococcus species NOT DETECTED NOT DETECTED Final   Streptococcus agalactiae NOT DETECTED NOT DETECTED Final   Streptococcus pneumoniae NOT DETECTED NOT DETECTED Final   Streptococcus pyogenes NOT DETECTED NOT DETECTED Final   Acinetobacter baumannii NOT DETECTED NOT DETECTED Final   Enterobacteriaceae species DETECTED (A) NOT DETECTED Final    Comment: Enterobacteriaceae represent a large family of gram-negative bacteria, not a single organism. CRITICAL RESULT CALLED TO, READ BACK BY AND VERIFIED WITH:  MICHAEL SIMPSON AT 1207 01/15/17 SDR    Enterobacter cloacae complex NOT DETECTED NOT DETECTED Final   Escherichia coli DETECTED (A) NOT DETECTED Final    Comment: CRITICAL RESULT CALLED TO, READ BACK BY AND VERIFIED WITH:  MICHAEL SIMPSON AT 1207 01/15/17 SDR    Klebsiella oxytoca NOT DETECTED NOT DETECTED Final   Klebsiella pneumoniae NOT DETECTED NOT DETECTED Final   Proteus species NOT DETECTED NOT DETECTED Final   Serratia marcescens NOT DETECTED NOT DETECTED Final   Carbapenem resistance NOT DETECTED NOT DETECTED Final   Haemophilus influenzae NOT DETECTED NOT DETECTED Final   Neisseria meningitidis NOT DETECTED NOT DETECTED Final   Pseudomonas aeruginosa NOT DETECTED NOT DETECTED Final   Candida albicans NOT DETECTED NOT DETECTED Final   Candida glabrata NOT DETECTED NOT DETECTED Final   Candida krusei NOT DETECTED NOT DETECTED Final   Candida parapsilosis NOT DETECTED NOT DETECTED Final   Candida tropicalis NOT DETECTED NOT DETECTED Final  MRSA PCR Screening     Status: None   Collection Time: 01/20/17 11:04 AM  Result Value Ref  Range Status   MRSA by PCR NEGATIVE NEGATIVE Final    Comment:        The GeneXpert MRSA Assay (FDA approved for NASAL specimens only), is one component of a comprehensive MRSA colonization surveillance program. It is not intended to diagnose MRSA infection nor to guide or monitor treatment for MRSA infections.      Studies: Dg Chest Port 1 View  Result Date: 01/20/2017 CLINICAL DATA:  Respiratory failure, intubated patient, sepsis, former smoker. EXAM: PORTABLE CHEST 1 VIEW COMPARISON:  Chest x-ray of January 16, 2017 FINDINGS: The lungs are well-expanded. Pleural effusions layer posteriorly, bilaterally. The retrocardiac region remains dense on the left. The heart is top-normal in size. The pulmonary vascularity is normal. There is calcification in the wall of the aortic arch. The endotracheal tube tip lies 3.1 cm above the carina. The esophagogastric tube and right internal jugular venous catheter are in reasonable position. The observed bony thorax exhibits no acute abnormality. IMPRESSION: Interval worsening in the appearance of the chest with moderate-sized bilateral pleural effusions layering posteriorly. Persistent left lower lobe atelectasis or pneumonia. No overt pulmonary edema. Thoracic aortic atherosclerosis. Electronically Signed   By: David  Martinique M.D.   On: 01/20/2017 10:22    Scheduled Meds: . chlorhexidine gluconate (MEDLINE KIT)  15 mL Mouth Rinse BID  . EPINEPHrine  1 mg Intravenous Once  . hydrocortisone sod succinate (SOLU-CORTEF) inj  50 mg Intravenous Q12H  . lactulose  20 g  Oral BID  . mouth rinse  15 mL Mouth Rinse 10 times per day  . pantoprazole (PROTONIX) IV  40 mg Intravenous Q24H  . phytonadione  2.5 mg Oral Daily  . sodium chloride flush  10-40 mL Intracatheter Q12H  . thiamine injection  100 mg Intravenous Daily  . vitamin A  20,000 Units Oral Daily  . zinc sulfate  220 mg Per Tube Daily   Continuous Infusions: . ciprofloxacin Stopped (01/20/17  1112)  . feeding supplement (VITAL AF 1.2 CAL) 1,000 mL (01/20/17 1400)  . norepinephrine (LEVOPHED) Adult infusion 5 mcg/min (01/20/17 1651)    Assessment/Plan:  1. Septic shock with Escherichia coli. Patient currently on levophed to maintain blood pressure.  Tapering stress dose steroids.  Patient on IV Cipro. 2. Acute respiratory failure with hypoxia. Patient currently on 35% FiO2. Weaning sedation to assess mental status and respiratory status. 3. Alcohol abuse. Patient currently on the ventilator. 4. Acute encephalopathy secondary to severe sepsis. 5. Pancytopenia could be secondary to alcohol abuse.  Could also possibly be secondary to DIC with the drop in platelets.  Patient with severe thrombocytopenia. 6. Hypomagnesemia, hypocalcemia and hypokalemia. ICU electrolyte replacement protocol 7. Severe malnutrition, Anasarca and third spacing of fluids.  Tube feeding started today. Vitamin K also ordered. 8. Jaundice likely secondary to shock  Code Status:     Code Status Orders        Start     Ordered   01/15/17 0158  Full code  Continuous     01/15/17 0157    Code Status History    Date Active Date Inactive Code Status Order ID Comments User Context   12/13/2015 11:49 AM 12/15/2015  7:28 PM Full Code [id number]  Dereck Leep, MD Inpatient   09/17/2014  6:01 AM 09/18/2014  3:51 PM Full Code [id number]  Juluis Mire, MD Inpatient     Family Communication: spoke with husband at the bedside Disposition Plan: requiring ICU care  Consultants:  Critical care specialist  Antibiotics:  Cipro  Time spent: 24 minutes  Loletha Grayer  [employer]

## 2017-01-20 NOTE — Consult Note (Signed)
[facility] Nurse wound consult note Reason for Consult: Edema to all extremities.  Large ulceration left posterior calf due to ruptured serum filled blisters.  Demarcation of the toes with a dusky look. Bruising upper extremities and lower extremities.  Bruising around bilateral eyelids.  Patient intubated and sedated  Wound type: blistering from edema Pressure Injury POA: NA Measurement: Left lower leg:  4 cm x 3.4 cm x 0.1 cm ruptured serum filled blister Left dorsal foot:  2 cm x 2 cm x 0.1 ruptured blister  Wound YME:BRAX and moist Drainage (amount, consistency, odor) Moderate serous weeping to all extremities Periwound:Edema Dressing procedure/placement/frequency:Cleanse ruptured blisters with soap and water and pat gently dry.  Apply calcium alginate dressing to wound bed for absorption.  Cover with 4x4 gauze and kerlix/tape.  Change daily.  Will not follow at this time.  Please re-consult if needed.  Domenic Moras RN BSN [place] Pager [phone]

## 2017-01-21 ENCOUNTER — Inpatient Hospital Stay: Payer: Medicare Other

## 2017-01-21 DIAGNOSIS — D696 Thrombocytopenia, unspecified: Secondary | ICD-10-CM

## 2017-01-21 DIAGNOSIS — R601 Generalized edema: Secondary | ICD-10-CM

## 2017-01-21 DIAGNOSIS — K709 Alcoholic liver disease, unspecified: Secondary | ICD-10-CM

## 2017-01-21 DIAGNOSIS — K7040 Alcoholic hepatic failure without coma: Secondary | ICD-10-CM

## 2017-01-21 LAB — CBC
HEMATOCRIT: 24.7 % — AB (ref 35.0–47.0)
HEMOGLOBIN: 8.2 g/dL — AB (ref 12.0–16.0)
MCH: 35.7 pg — ABNORMAL HIGH (ref 26.0–34.0)
MCHC: 33.1 g/dL (ref 32.0–36.0)
MCV: 107.8 fL — ABNORMAL HIGH (ref 80.0–100.0)
Platelets: 30 10*3/uL — ABNORMAL LOW (ref 150–440)
RBC: 2.29 MIL/uL — ABNORMAL LOW (ref 3.80–5.20)
RDW: 16.8 % — AB (ref 11.5–14.5)
WBC: 22.6 10*3/uL — AB (ref 3.6–11.0)

## 2017-01-21 LAB — COMPREHENSIVE METABOLIC PANEL
ALBUMIN: 1.7 g/dL — AB (ref 3.5–5.0)
ALK PHOS: 166 U/L — AB (ref 38–126)
ALT: 51 U/L (ref 14–54)
ANION GAP: 3 — AB (ref 5–15)
AST: 227 U/L — AB (ref 15–41)
BUN: 31 mg/dL — AB (ref 6–20)
CO2: 25 mmol/L (ref 22–32)
Calcium: 7.2 mg/dL — ABNORMAL LOW (ref 8.9–10.3)
Chloride: 105 mmol/L (ref 101–111)
Creatinine, Ser: 0.93 mg/dL (ref 0.44–1.00)
GFR calc Af Amer: >60 mL/min (ref >60)
GFR calc non Af Amer: 59 mL/min — ABNORMAL LOW (ref >60)
GLUCOSE: 95 mg/dL (ref 65–99)
POTASSIUM: 4.5 mmol/L (ref 3.5–5.1)
SODIUM: 133 mmol/L — AB (ref 135–145)
Total Bilirubin: 12.2 mg/dL — ABNORMAL HIGH (ref 0.3–1.2)
Total Protein: 3.8 g/dL — ABNORMAL LOW (ref 6.5–8.1)

## 2017-01-21 LAB — GLUCOSE, CAPILLARY
GLUCOSE-CAPILLARY: 110 mg/dL — AB (ref 65–99)
GLUCOSE-CAPILLARY: 137 mg/dL — AB (ref 65–99)
GLUCOSE-CAPILLARY: 149 mg/dL — AB (ref 65–99)
GLUCOSE-CAPILLARY: 97 mg/dL (ref 65–99)
Glucose-Capillary: 91 mg/dL (ref 65–99)
Glucose-Capillary: 87 mg/dL (ref 65–99)

## 2017-01-21 LAB — MAGNESIUM: MAGNESIUM: 1.8 mg/dL (ref 1.7–2.4)

## 2017-01-21 LAB — PHOSPHORUS

## 2017-01-21 LAB — CALCIUM, IONIZED: CALCIUM, IONIZED, SERUM: 4.2 mg/dL — AB (ref 4.5–5.6)

## 2017-01-21 MED ORDER — PHYTONADIONE 5 MG PO TABS
10.0000 mg | ORAL_TABLET | Freq: Every day | ORAL | Status: DC
  Administered 2017-01-21 – 2017-01-31 (×11): 10 mg
  Filled 2017-01-21 (×11): qty 2

## 2017-01-21 MED ORDER — PANTOPRAZOLE SODIUM 40 MG PO PACK
40.0000 mg | PACK | Freq: Every day | ORAL | Status: DC
  Administered 2017-01-21 – 2017-01-23 (×3): 40 mg
  Filled 2017-01-21 (×3): qty 20

## 2017-01-21 MED ORDER — VITAMIN B-12 1000 MCG PO TABS
1000.0000 ug | ORAL_TABLET | Freq: Every day | ORAL | Status: DC
  Administered 2017-01-21 – 2017-01-31 (×11): 1000 ug
  Filled 2017-01-21 (×11): qty 1

## 2017-01-21 MED ORDER — VITAMIN B-1 100 MG PO TABS
100.0000 mg | ORAL_TABLET | Freq: Every day | ORAL | Status: DC
  Administered 2017-01-22 – 2017-01-31 (×10): 100 mg
  Filled 2017-01-21 (×11): qty 1

## 2017-01-21 MED ORDER — HYDROCORTISONE NA SUCCINATE PF 100 MG IJ SOLR
25.0000 mg | Freq: Two times a day (BID) | INTRAMUSCULAR | Status: DC
  Administered 2017-01-21 – 2017-01-23 (×4): 25 mg via INTRAVENOUS
  Filled 2017-01-21 (×4): qty 2

## 2017-01-21 MED ORDER — DEXMEDETOMIDINE HCL IN NACL 400 MCG/100ML IV SOLN
0.4000 ug/kg/h | INTRAVENOUS | Status: DC
  Administered 2017-01-21: 0.4 ug/kg/h via INTRAVENOUS
  Administered 2017-01-21: 0.6 ug/kg/h via INTRAVENOUS
  Administered 2017-01-22 (×2): 0.8 ug/kg/h via INTRAVENOUS
  Filled 2017-01-21 (×2): qty 100

## 2017-01-21 MED ORDER — LACTULOSE 10 GM/15ML PO SOLN
30.0000 g | Freq: Two times a day (BID) | ORAL | Status: DC
  Administered 2017-01-21 – 2017-01-23 (×5): 30 g
  Filled 2017-01-21 (×5): qty 60

## 2017-01-21 MED ORDER — FENTANYL CITRATE (PF) 100 MCG/2ML IJ SOLN
25.0000 ug | INTRAMUSCULAR | Status: DC | PRN
  Administered 2017-01-21 (×3): 25 ug via INTRAVENOUS
  Filled 2017-01-21 (×3): qty 2

## 2017-01-21 MED ORDER — SODIUM CHLORIDE 0.9 % IV SOLN
1.0000 mg | Freq: Once | INTRAVENOUS | Status: AC
  Administered 2017-01-21: 1 mg via INTRAVENOUS
  Filled 2017-01-21: qty 0.2

## 2017-01-21 NOTE — Progress Notes (Signed)
                                                                                                                                                                                                         Daily Progress Note   Patient Name: Sonya Park       Date: 01/21/2017 DOB: 01/10/1941  Age: 76 y.o. MRN#: [id number] Attending Physician: Wieting, Richard, MD Primary Care Physician: Masoud, Javed, MD Admit Date: 01/14/2017  Reason for Consultation/Follow-up: Establishing goals of care  Subjective/GOC: Patient intubated. Sedation off during assessment. Patient is alert and biting on ET tube. She will nod yes/no to a few questions asked.   Husband, Lance at bedside. He tells me of the plan for possible extubation tomorrow. She is having an abdominal ultrasound today. Lance remains hopeful that she will be able to breath on her own when the tube is removed. He confirms continued aggressive medical interventions.  Length of Stay: 7  Current Medications: Scheduled Meds:  . chlorhexidine gluconate (MEDLINE KIT)  15 mL Mouth Rinse BID  . hydrocortisone sod succinate (SOLU-CORTEF) inj  25 mg Intravenous Q12H  . lactulose  30 g Per Tube BID  . mouth rinse  15 mL Mouth Rinse 10 times per day  . pantoprazole sodium  40 mg Per Tube Daily  . phytonadione  10 mg Per Tube Daily  . sodium chloride flush  10-40 mL Intracatheter Q12H  . thiamine injection  100 mg Intravenous Daily  . vitamin A  20,000 Units Oral Daily  . vitamin B-12  1,000 mcg Per Tube Daily  . zinc sulfate  220 mg Per Tube Daily    Continuous Infusions: . ciprofloxacin 400 mg (01/21/17 1226)  . feeding supplement (VITAL AF 1.2 CAL) 1,000 mL (01/21/17 0200)    PRN Meds: fentaNYL (SUBLIMAZE) injection, [DISCONTINUED] ondansetron **OR** ondansetron (ZOFRAN) IV, sodium chloride flush  Physical Exam  Constitutional: She appears ill. She is intubated.  Cardiovascular: Regular rhythm.   Pulmonary/Chest: She is intubated. She  has decreased breath sounds.  Abdominal: There is no tenderness.  Musculoskeletal: She exhibits edema (generalized).  Neurological: She is alert.  Off sedation. Nods head yes/no to some questions  Skin: Skin is warm and dry. Ecchymosis noted. There is pallor.  Nursing note and vitals reviewed.          Vital Signs: BP 98/63   Pulse 64   Temp (!) 97.5 F (36.4 C)   Resp 15   Ht 5' 6" (1.676 m)   Wt 71 kg (156 lb 8.4 oz)     SpO2 95%   BMI 25.26 kg/m  SpO2: SpO2: 95 % O2 Device: O2 Device: Ventilator O2 Flow Rate:    Intake/output summary:   Intake/Output Summary (Last 24 hours) at 01/21/17 1228 Last data filed at 01/21/17 0600  Gross per 24 hour  Intake            559.6 ml  Output             1372 ml  Net           -812.4 ml   LBM: Last BM Date:  (flexi-seal in place) Baseline Weight: Weight: 63 kg (139 lb) Most recent weight: Weight: 71 kg (156 lb 8.4 oz)       Palliative Assessment/Data: PPS 10%   Flowsheet Rows     Most Recent Value  Intake Tab  Referral Department  Critical care  Unit at Time of Referral  ICU  Palliative Care Primary Diagnosis  Sepsis/Infectious Disease  Palliative Care Type  New Palliative care  Reason for referral  Clarify Goals of Care  Date first seen by Palliative Care  01/17/17  Clinical Assessment  Palliative Performance Scale Score  10%  Psychosocial & Spiritual Assessment  Palliative Care Outcomes  Patient/Family meeting held?  Yes  Who was at the meeting?  husband  Palliative Care Outcomes  Clarified goals of care, Provided end of life care assistance, ACP counseling assistance, Provided psychosocial or spiritual support      Patient Active Problem List   Diagnosis Date Noted  . Pressure injury of skin 01/19/2017  . Acute respiratory failure ([place])   . Bacteremia   . Palliative care by specialist   . Goals of care, counseling/discussion   . Alcohol abuse 01/15/2017  . Sepsis ([place]) 01/14/2017  . Facial trauma 01/14/2017  .  Osteoporosis 01/14/2017  . Neutropenia ([place]) 01/14/2017  . 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 Special screening for malignant neoplasms, colon   . Benign neoplasm of transverse colon   . Fracture of right inferior pubic ramus ([place]) 09/17/2014  . Compression fracture of L3 lumbar vertebra ([facility]) 09/17/2014    Palliative Care Assessment & Plan   Patient Profile: 76 y.o. female  with past medical history of osteoporosis, arthritis, insomnia, back pain, bilateral knee replacements, gastric bypass, and ETOH abuse admitted on 01/14/2017 after fall on 01/13/17. She went to PCP on 10/9 and CT maxillofacial revealed bilateral bone fractures without significant displacement, left frontal hemorrhage suggestive of occult fracture, and extensive bilateral periorbital soft swelling and hematoma with inflammatory changes over bilateral maxilla. In ED, lab results revealed WBC 1.9 and lactic acid of 6.7. Patient also hypotensive. Admitted to ICU for sepsis. On 10/10, patient intubated secondary to severe metabolic acidosis and encephalopathy. Continuous vasopressin, neo-synephrine, and levophed infusions to maintain map >65. Blood cultures positive for enterobacteriaceae species and ecoli. Receiving antibiotics. Remains in critical condition due to septic shock. Palliative medicine consultation for goals of care.   Assessment: Acute respiratory failure with hypoxemia Septic shock E coli bacteremia Bilateral pleural effusions Hyperbilirubinemia Elevated LFT's Severe thrombocytopenia Hx of ETOH abuse  Recommendations/Plan:  Continue FULL code/FULL scope treatment.  Husband hopeful for vent wean/extubation. Will likely pursue trach/peg if necessary.   PMT will continue to support patient/family through hospitalization.  Goals of Care and Additional Recommendations:  Limitations on Scope of Treatment: Full Scope Treatment  Code Status: FULL   Code Status Orders        Start     Ordered  01/15/17 0158  Full code  Continuous     01/15/17 0157    Code Status History    Date Active Date Inactive Code Status Order ID Comments User Context   12/13/2015 11:49 AM 12/15/2015  7:28 PM Full Code [id number]  Dereck Leep, MD Inpatient   09/17/2014  6:01 AM 09/18/2014  3:51 PM Full Code [id number]  Juluis Mire, MD Inpatient       Prognosis:   Unable to determine  Discharge Planning:  To Be Determined  Care plan was discussed with patient, RN, husband [name]), and present during ICU interdisciplinary rounds  Thank you for allowing the Palliative Medicine Team to assist in the care of this patient.   Time In: 1100 Time Out: 1135 Total Time 12mn Prolonged Time Billed  no       Greater than 50%  of this time was spent counseling and coordinating care related to the above assessment and plan.  MIhor Dow FNP-C Palliative Medicine Team  Phone: [phone]: [phone] Please contact Palliative Medicine Team phone at [phone] questions and concerns.

## 2017-01-21 NOTE — Progress Notes (Signed)
Patient ID: Sonya Park, female   DOB: 10/16/1940, 76 y.o.   MRN: [id number]   [facility] PROGRESS NOTE  Sonya Park MRN:[id number] DOB: 10/18/1940 DOA: 01/14/2017 PCP: Masoud, Javed, MD  HPI/Subjective: Patient intubated. Patient moving her right arm. She shook her head no when I said I will see her tomorrow.  Objective: Vitals:   01/21/17 1200 01/21/17 1300  BP: 91/66 (!) 90/55  Pulse: 82 77  Resp: (!) 29 16  Temp: 97.7 F (36.5 C) 97.9 F (36.6 C)  SpO2: 96% 95%    Filed Weights   01/14/17 1456 01/15/17 1530  Weight: 63 kg (139 lb) 71 kg (156 lb 8.4 oz)    ROS: Review of Systems  Unable to perform ROS: Acuity of condition   Exam: Physical Exam  Constitutional: She is intubated.  HENT:  Nose: No mucosal edema.  Eyes: Pupils are equal, round, and reactive to light.  Icteric.   Neck: Carotid bruit is not present. No thyromegaly present.  Cardiovascular: Regular rhythm, S1 normal and S2 normal.   Respiratory: She is intubated. She has decreased breath sounds in the right lower field and the left lower field. She has no wheezes. She has no rhonchi. She has no rales.  GI: Soft. Bowel sounds are normal. There is no tenderness.  Musculoskeletal:       Right elbow: She exhibits swelling.       Left elbow: She exhibits swelling.       Right wrist: She exhibits swelling.       Left wrist: She exhibits tenderness.       Right knee: She exhibits swelling.       Left knee: She exhibits swelling.       Right ankle: She exhibits swelling.       Left ankle: She exhibits swelling.  Neurological:  Patient intubated and sedated  Skin:  Large ulceration left posterior calf. Demarcation of the toes with a dusky look. Left third and fourth toe still brownish lookingBruising upper extremities and lower extremities.  Bruising around bilateral eyelids.  Psychiatric:  Patient intubated and sedated      Data Reviewed: Basic Metabolic Panel:  Recent Labs Lab  01/18/17 0421 01/19/17 0027 01/19/17 0512 01/19/17 1751 01/20/17 0503 01/20/17 0508 01/21/17 0516  NA 135 132* 134* 134*  --  135 133*  K 3.5 3.8 4.2 4.5  --  4.6 4.5  CL 101 102 100* 102  --  101 105  CO2 25 22 23 25  --  25 25  GLUCOSE 120* 106* 102* 100*  --  120* 95  BUN 17 18 19 21*  --  23* 31*  CREATININE 0.75 0.73 0.81 0.89  --  0.78 0.93  CALCIUM 6.1* 6.1* 6.7* 6.8*  --  7.0* 7.2*  MG 2.1 1.9 1.9  --  1.8  --  1.8  PHOS 1.8* 2.8 2.8  --  3.6  --  NOT CALCULATED   Liver Function Tests:  Recent Labs Lab 01/14/17 2008 01/15/17 1113 01/16/17 0421 01/20/17 0508 01/21/17 0516  AST 137*  --  182* 147* 227*  ALT 61*  --  50 41 51  ALKPHOS 126  --  89 108 166*  BILITOT 3.8*  --  3.3* 10.4* 12.2*  PROT 4.9*  --  3.7* 3.6* 3.8*  ALBUMIN 2.5* 1.5* 1.7* 1.8* 1.7*    Recent Labs Lab 01/17/17 1225 01/18/17 0421  AMMONIA 52* 54*   CBC:  Recent Labs Lab 01/14/17   2008  01/17/17 1225 01/17/17 1806 01/18/17 0421 01/20/17 0503 01/21/17 0516  WBC 1.9*  < > 14.1* 14.2* 14.7* 18.9* 22.6*  NEUTROABS 1.3*  --  13.0* 13.0* 13.4*  --   --   HGB 10.8*  < > 10.6* 10.4* 9.9* 7.6* 8.2*  HCT 31.8*  < > 31.3* 30.4* 29.2* 23.0* 24.7*  MCV 107.0*  < > 107.3* 105.6* 105.1* 107.3* 107.8*  PLT 148*  < > 20* 16* 11* 14* 30*  < > = values in this interval not displayed. Cardiac Enzymes:  Recent Labs Lab 01/14/17 2008  CKTOTAL 569*  TROPONINI <0.03    CBG:  Recent Labs Lab 01/20/17 1946 01/21/17 0015 01/21/17 0503 01/21/17 0724 01/21/17 1119  GLUCAP 88 137* 97 110* 91    Recent Results (from the past 240 hour(s))  Blood Culture (routine x 2)     Status: Abnormal   Collection Time: 01/14/17 11:08 PM  Result Value Ref Range Status   Specimen Description BLOOD LFOA  Final   Special Requests   Final    BOTTLES DRAWN AEROBIC AND ANAEROBIC Blood Culture results may not be optimal due to an excessive volume of blood received in culture bottles   Culture  Setup Time    Final    GRAM NEGATIVE RODS IN BOTH AEROBIC AND ANAEROBIC BOTTLES CRITICAL RESULT CALLED TO, READ BACK BY AND VERIFIED WITH:  MICHAEL SIMPSON AT 1207 01/15/17 SDR    Culture ESCHERICHIA COLI (A)  Final   Report Status 01/17/2017 FINAL  Final   Organism ID, Bacteria ESCHERICHIA COLI  Final      Susceptibility   Escherichia coli - MIC*    AMPICILLIN >=32 RESISTANT Resistant     CEFAZOLIN <=4 SENSITIVE Sensitive     CEFEPIME <=1 SENSITIVE Sensitive     CEFTAZIDIME <=1 SENSITIVE Sensitive     CEFTRIAXONE <=1 SENSITIVE Sensitive     CIPROFLOXACIN 1 SENSITIVE Sensitive     GENTAMICIN <=1 SENSITIVE Sensitive     IMIPENEM <=0.25 SENSITIVE Sensitive     TRIMETH/SULFA >=320 RESISTANT Resistant     AMPICILLIN/SULBACTAM >=32 RESISTANT Resistant     PIP/TAZO <=4 SENSITIVE Sensitive     Extended ESBL NEGATIVE Sensitive     * ESCHERICHIA COLI  Blood Culture (routine x 2)     Status: Abnormal   Collection Time: 01/14/17 11:08 PM  Result Value Ref Range Status   Specimen Description BLOOD LAC  Final   Special Requests   Final    BOTTLES DRAWN AEROBIC AND ANAEROBIC Blood Culture results may not be optimal due to an excessive volume of blood received in culture bottles   Culture  Setup Time   Final    GRAM NEGATIVE RODS IN BOTH AEROBIC AND ANAEROBIC BOTTLES CRITICAL VALUE NOTED.  VALUE IS CONSISTENT WITH PREVIOUSLY REPORTED AND CALLED VALUE.    Culture (A)  Final    ESCHERICHIA COLI SUSCEPTIBILITIES PERFORMED ON PREVIOUS CULTURE WITHIN THE LAST 5 DAYS. Performed at [facility], [place] [address], [place], [place] [address]    Report Status 01/17/2017 FINAL  Final  Urine culture     Status: Abnormal   Collection Time: 01/14/17 11:08 PM  Result Value Ref Range Status   Specimen Description URINE, RANDOM  Final   Special Requests NONE  Final   Culture (A)  Final    <10,000 COLONIES/mL INSIGNIFICANT GROWTH Performed at [facility], [place] [address] [place], [place] [address]     Report Status 01/16/2017  FINAL  Final  Blood Culture ID Panel (Reflexed)     Status: Abnormal   Collection Time: 01/14/17 11:08 PM  Result Value Ref Range Status   Enterococcus species NOT DETECTED NOT DETECTED Final   Listeria monocytogenes NOT DETECTED NOT DETECTED Final   Staphylococcus species NOT DETECTED NOT DETECTED Final   Staphylococcus aureus NOT DETECTED NOT DETECTED Final   Streptococcus species NOT DETECTED NOT DETECTED Final   Streptococcus agalactiae NOT DETECTED NOT DETECTED Final   Streptococcus pneumoniae NOT DETECTED NOT DETECTED Final   Streptococcus pyogenes NOT DETECTED NOT DETECTED Final   Acinetobacter baumannii NOT DETECTED NOT DETECTED Final   Enterobacteriaceae species DETECTED (A) NOT DETECTED Final    Comment: Enterobacteriaceae represent a large family of gram-negative bacteria, not a single organism. CRITICAL RESULT CALLED TO, READ BACK BY AND VERIFIED WITH:  MICHAEL SIMPSON AT 1207 01/15/17 SDR    Enterobacter cloacae complex NOT DETECTED NOT DETECTED Final   Escherichia coli DETECTED (A) NOT DETECTED Final    Comment: CRITICAL RESULT CALLED TO, READ BACK BY AND VERIFIED WITH:  MICHAEL SIMPSON AT 1207 01/15/17 SDR    Klebsiella oxytoca NOT DETECTED NOT DETECTED Final   Klebsiella pneumoniae NOT DETECTED NOT DETECTED Final   Proteus species NOT DETECTED NOT DETECTED Final   Serratia marcescens NOT DETECTED NOT DETECTED Final   Carbapenem resistance NOT DETECTED NOT DETECTED Final   Haemophilus influenzae NOT DETECTED NOT DETECTED Final   Neisseria meningitidis NOT DETECTED NOT DETECTED Final   Pseudomonas aeruginosa NOT DETECTED NOT DETECTED Final   Candida albicans NOT DETECTED NOT DETECTED Final   Candida glabrata NOT DETECTED NOT DETECTED Final   Candida krusei NOT DETECTED NOT DETECTED Final   Candida parapsilosis NOT DETECTED NOT DETECTED Final   Candida tropicalis NOT DETECTED NOT DETECTED Final  MRSA PCR Screening     Status: None    Collection Time: 01/20/17 11:04 AM  Result Value Ref Range Status   MRSA by PCR NEGATIVE NEGATIVE Final    Comment:        The GeneXpert MRSA Assay (FDA approved for NASAL specimens only), is one component of a comprehensive MRSA colonization surveillance program. It is not intended to diagnose MRSA infection nor to guide or monitor treatment for MRSA infections.      Studies: Dg Chest Port 1 View  Result Date: 01/20/2017 CLINICAL DATA:  Respiratory failure, intubated patient, sepsis, former smoker. EXAM: PORTABLE CHEST 1 VIEW COMPARISON:  Chest x-ray of January 16, 2017 FINDINGS: The lungs are well-expanded. Pleural effusions layer posteriorly, bilaterally. The retrocardiac region remains dense on the left. The heart is top-normal in size. The pulmonary vascularity is normal. There is calcification in the wall of the aortic arch. The endotracheal tube tip lies 3.1 cm above the carina. The esophagogastric tube and right internal jugular venous catheter are in reasonable position. The observed bony thorax exhibits no acute abnormality. IMPRESSION: Interval worsening in the appearance of the chest with moderate-sized bilateral pleural effusions layering posteriorly. Persistent left lower lobe atelectasis or pneumonia. No overt pulmonary edema. Thoracic aortic atherosclerosis. Electronically Signed   By: David  Jordan M.D.   On: 01/20/2017 10:22    Scheduled Meds: . chlorhexidine gluconate (MEDLINE KIT)  15 mL Mouth Rinse BID  . hydrocortisone sod succinate (SOLU-CORTEF) inj  25 mg Intravenous Q12H  . lactulose  30 g Per Tube BID  . mouth rinse  15 mL Mouth Rinse 10 times per day  . pantoprazole sodium  40 mg Per   Tube Daily  . phytonadione  10 mg Per Tube Daily  . sodium chloride flush  10-40 mL Intracatheter Q12H  . [START ON 01/22/2017] thiamine  100 mg Per Tube Daily  . vitamin A  20,000 Units Oral Daily  . vitamin B-12  1,000 mcg Per Tube Daily  . zinc sulfate  220 mg Per Tube  Daily   Continuous Infusions: . ciprofloxacin Stopped (01/21/17 1326)  . feeding supplement (VITAL AF 1.2 CAL) Stopped (01/21/17 1100)    Assessment/Plan:  1. Septic shock with Escherichia coli. Patient off pressors at this point.Tapering stress dose steroids.  Patient on IV Cipro. 2. Acute respiratory failure with hypoxia. Patient currently on 35% FiO2. At this point, they will assess daily for potential extubation. 3. Alcohol abuse. Likely would be through with any withdrawal processhile she was on the ventilator. 4. Acute encephalopathy secondary to severe sepsis. 5. Pancytopenia could be secondary to alcohol abuse and/or sepsis. Platelet count starting to recover and is up at 30,000. 6. Hypomagnesemia, hypocalcemia and hypokalemia. Electrolytes have been replaced. 7. Severe malnutrition, Anasarca and third spacing of fluids.  Tube feeding on hold for ultrasound the liver but will be restarted this evening 8. Jaundice likely secondary to shock. Ultrasound the liver ordered by critical care specialist. Hepatic steatosis seen on previous CT scan. 9. Facial trauma at home. Fractured nasal bones. Hematoma and bruising around the eyelids.  Code Status:     Code Status Orders        Start     Ordered   01/15/17 0158  Full code  Continuous     01/15/17 0157    Code Status History    Date Active Date Inactive Code Status Order ID Comments User Context   12/13/2015 11:49 AM 12/15/2015  7:28 PM Full Code [id number]  Dereck Leep, MD Inpatient   09/17/2014  6:01 AM 09/18/2014  3:51 PM Full Code [id number]  Juluis Mire, MD Inpatient     Family Communication: spoke with husband at the bedside Disposition Plan: requiring ICU care  Consultants:  Critical care specialist  Antibiotics:  Cipro  Time spent: 24 minutes  Loletha Grayer  [employer]

## 2017-01-21 NOTE — Progress Notes (Signed)
PHARMACIST - PHYSICIAN COMMUNICATION  DR:   Alva Garnet  CONCERNING: IV to Oral Route Change Policy  RECOMMENDATION: This patient is receiving famotidine by the intravenous route.  Based on criteria approved by the Pharmacy and Therapeutics Committee, the intravenous medication(s) is/are being converted to the equivalent oral dose form(s).   DESCRIPTION: These criteria include:  The patient is eating (either orally or via tube) and/or has been taking other orally administered medications for a least 24 hours  The patient has no evidence of active gastrointestinal bleeding or impaired GI absorption (gastrectomy, short bowel, patient on TNA or NPO).  If you have questions about this conversion, please contact the Pharmacy Department  []   [phone] )  Sonya Park [x]   [phone] )  Sonya Park []   [phone] )  Sonya Park []   [phone] )  [facility] []   [phone] )  [place], [facility] 01/21/2017 1:30 PM

## 2017-01-21 NOTE — Progress Notes (Signed)
Pharmacy Antibiotic Note  Sonya Park is a 76 y.o. female admitted on 01/14/2017 with sepsis and e coli bacteremia.  Pharmacy has been consulted for ciprofloxacin dosing.  Plan: Continue ciprofloxacin 400 mg IV q 12 hours. Do not change to PO as patient is receiving continuous tube feeds.   Height: 5\' 6"  (167.6 cm) Weight: 156 lb 8.4 oz (71 kg) IBW/kg (Calculated) : 59.3  Temp (24hrs), Avg:97.3 F (36.3 C), Min:96.1 F (35.6 C), Max:98.1 F (36.7 C)   Recent Labs Lab 01/15/17 0330  01/16/17 1106 01/16/17 2225  01/17/17 1225 01/17/17 1519 01/17/17 1806 01/18/17 0421 01/19/17 0027 01/19/17 0512 01/19/17 1237 01/19/17 1751 01/20/17 0503 01/20/17 0508 01/21/17 0516  WBC 0.8*  < >  --   --   --  14.1*  --  14.2* 14.7*  --   --   --   --  18.9*  --  22.6*  CREATININE 1.22*  < >  --   --   < >  --   --  0.73 0.75 0.73 0.81  --  0.89  --  0.78 0.93  LATICACIDVEN 13.0*  --  10.5* 9.7*  --   --  6.7*  --   --   --   --  3.3*  --   --   --   --   < > = values in this interval not displayed.  Estimated Creatinine Clearance: 48.9 mL/min (by C-G formula based on SCr of 0.93 mg/dL).    Allergies  Allergen Reactions  . Other Itching    "mycin" doesn't know which one  . Penicillins Swelling    Has patient had a PCN reaction causing immediate rash, facial/tongue/throat swelling, SOB or lightheadedness with hypotension: Yes Has patient had a PCN reaction causing severe rash involving mucus membranes or skin necrosis: No Has patient had a PCN reaction that required hospitalization: No Has patient had a PCN reaction occurring within the last 10 years: No If all of the above answers are "NO", then may proceed with Cephalosporin use.    Antimicrobials this admission:  Levofloxacin 10/9 >> 10/10 Vancomycin 10/9 >> 10/10 Fluconazole 10/10 >> 10/10 Meropenem 10/9 >> 10/11 Ciprofloxacin 10/12 >>   Dose adjustments this admission:   Microbiology results: 10/9 BCx (x2): non ESBL  e coli 10/9 BCID: e coli 10/9 UCx: <10,000 colonies/ml insignificant growth 10/10 Fungus Cx: sent 10/15 MRSA PCR: negative  Thank you for allowing pharmacy to be a part of this patient's care.  Oralia Manis 01/21/2017 12:55 PM

## 2017-01-21 NOTE — Care Management (Signed)
I have asked [facility] and [facility] to screen patient for LTAC benefits.

## 2017-01-21 NOTE — Progress Notes (Signed)
PULMONARY / CRITICAL CARE MEDICINE   Name: Sonya Park MRN: [id number] DOB: [birth year]    ADMISSION DATE:  01/14/2017  PT PROFILE: 76 y.o. F with h/o EtOH abuse admitted 01/14/17 with fall, facial contusions, severe sepsis. BCs positive for E coli. Intubated 10/10 for decreased LOC and worsening shock.   MAJOR EVENTS/TEST RESULTS: 10/09 Admitted via ED 10/09 CT head: Periorbital and frontal scalp soft tissue thickening, without acute intracranial abnormality. New hyperattenuating material in the left frontal sinus could represent hemorrhage. No acute fracture is seen  10/09 CT maxillofacial: Bilateral nasal bone fractures without significant displacement. Left frontal hemorrhage suggest an occult fracture. No discrete fracture is visualized 10/09 CTAP: Marked diffuse hepatic steatosis. No acute findings 10/09 CT chest: No acute findings 10/10 Worsening shock. Worsening level of consciousness. Intubated 10/13 echocardiogram: LVEF 45-50%. Mild concentric hypertrophy. Mild MR. Moderate TR. RA mildly dilated 10/16 off vasopressors. Worsening hyperbilirubinemia. Tolerates PSV mode. Cognition still poor 10/16 RUQ [place]:   INDWELLING DEVICES:: R femoral A-line 10/10 >> 10/16 ETT 10/10 >>  R IJ CVL 10/10 >>    MICRO DATA: MRSA PCR 10/15 >> NEG Urine 10/09 >> insignificant growth Blood 10/09 >> 2/2 Escherichia coli  ANTIMICROBIALS:  Vanc 10/09 >> 10/10 Aztreonam 10/09 >> 10/09 Levofloxacin 10/09 >> 10/10 Meropenem 10/09 >> 10/12 Cipro 10/12 >>    SUBJECTIVE:  Eyes open. Regards examiner. Not F/C. Tolerates PSV 5 cm H2O   VITAL SIGNS: BP (!) 81/59   Pulse 63   Temp 97.7 F (36.5 C)   Resp (!) 9   Ht 5\' 6"  (1.676 m)   Wt 71 kg (156 lb 8.4 oz)   SpO2 96%   BMI 25.26 kg/m   HEMODYNAMICS:    VENTILATOR SETTINGS: Vent Mode: PSV FiO2 (%):  [35 %] 35 % Set Rate:  [15 bmp] 15 bmp Vt Set:  [450 mL] 450 mL PEEP:  [5 cmH20] 5 cmH20 Delta P (Amplitude):  [11] 11 Pressure  Support:  [5 cmH20] 5 cmH20  INTAKE / OUTPUT: I/O last 3 completed shifts: In: 1851.1 [I.V.:1458.5; NG/GT:392.6] Out: 1837 [Urine:837; Stool:1000]  PHYSICAL EXAMINATION: General: intubated, NAD Neuro: CNs intact, MAEs HEENT: healing bilateral periorbital contusions, + sclericterus Cardiovascular: regular, no M Lungs: clear anteriorly Abdomen: mildly distended, BS present, NT Ext: 3-4+ pitting pretibial edema Skin: Multiple oozing ulcerations on be LE. Severe jaundice  LABS:  BMET  Recent Labs Lab 01/19/17 1751 01/20/17 0508 01/21/17 0516  NA 134* 135 133*  K 4.5 4.6 4.5  CL 102 101 105  CO2 25 25 25   BUN 21* 23* 31*  CREATININE 0.89 0.78 0.93  GLUCOSE 100* 120* 95    Electrolytes  Recent Labs Lab 01/19/17 0512 01/19/17 1751 01/20/17 0503 01/20/17 0508 01/21/17 0516  CALCIUM 6.7* 6.8*  --  7.0* 7.2*  MG 1.9  --  1.8  --  1.8  PHOS 2.8  --  3.6  --  NOT CALCULATED    CBC  Recent Labs Lab 01/18/17 0421 01/20/17 0503 01/21/17 0516  WBC 14.7* 18.9* 22.6*  HGB 9.9* 7.6* 8.2*  HCT 29.2* 23.0* 24.7*  PLT 11* 14* 30*    Coag's  Recent Labs Lab 01/14/17 2008  INR 1.29    Sepsis Markers  Recent Labs Lab 01/15/17 0330 01/16/17 0421  01/16/17 2225 01/17/17 0430 01/17/17 1519 01/19/17 1237  LATICACIDVEN 13.0*  --   < > 9.7*  --  6.7* 3.3*  PROCALCITON 4.64 8.27  --   --  5.36  --   --   < > =  values in this interval not displayed.  ABG  Recent Labs Lab 01/16/17 1030 01/17/17 1117 01/18/17 0430  PHART 7.50* 7.51* 7.52*  PCO2ART 25* 30* 29*  PO2ART 103 79* 92    Liver Enzymes  Recent Labs Lab 01/16/17 0421 01/20/17 0508 01/21/17 0516  AST 182* 147* 227*  ALT 50 41 51  ALKPHOS 89 108 166*  BILITOT 3.3* 10.4* 12.2*  ALBUMIN 1.7* 1.8* 1.7*    Cardiac Enzymes  Recent Labs Lab 01/14/17 2008  TROPONINI <0.03    Glucose  Recent Labs Lab 01/20/17 1801 01/20/17 1946 01/21/17 0015 01/21/17 0503 01/21/17 0724  01/21/17 1119  GLUCAP 87 88 137* 97 110* 91    CXR: no new film  ASSESSMENT / PLAN:  PULMONARY A: Acute, ventilator dependent respiratory failure with hypoxemia Bilateral pleural effusions - likely due to generalized volume overload Difficult intubation P:   Cont vent support - settings reviewed and/or adjusted PSV mode as tolerated Cont vent bundle Daily SBT if/when meets criteria  Will consider trial of extubation 10/17  CARDIOVASCULAR A:  Septic shock, resolving P:  Monitor hemodynamics Resume norepinephrine as needed to maintain MAP >60 mmHg  RENAL A:   Anasarca Mild hyponatremia P:   Monitor BMET intermittently Monitor I/Os Correct electrolytes as indicated   GASTROINTESTINAL A:   Elevated LFTs Severe hyperbilirubinemia P:   SUP: IV PPI Continue TF protocol - initiated 10/15  HEMATOLOGIC A:   ICU acquired anemia without overt blood loss Severe thrombocytopenia - improving P:  DVT px: SCDs Monitor CBC intermittently Transfuse per usual guidelines   INFECTIOUS A:   Severe sepsis E coli bacteremia P:   Monitor temp, WBC count Micro and abx as above  Will complete 10 days antibiotics  ENDOCRINE A:   Mild, stress-induced hyperglycemia Relative adrenal insufficiency P:   Monitor glucose on chemistry panels Consider SSI for glu > 180 Taper hydrocortisone - dosed reduced 10/16  NEUROLOGIC A:   ICU/vent associated discomfort Acute encephalopathy P:   RASS goal: 0, -1 minimize all sedatives Continue lactulose   FAMILY: husband updated at bedside in detail  CCM time: 35 mins The above time includes time spent in consultation with patient and/or family members and reviewing care plan on multidisciplinary rounds  Merton Border, MD PCCM service Mobile [phone] Pager [phone]  01/21/2017, 4:29 PM

## 2017-01-22 ENCOUNTER — Inpatient Hospital Stay: Payer: Medicare Other

## 2017-01-22 LAB — BASIC METABOLIC PANEL
Anion gap: 4 — ABNORMAL LOW (ref 5–15)
BUN: 43 mg/dL — AB (ref 6–20)
CHLORIDE: 107 mmol/L (ref 101–111)
CO2: 26 mmol/L (ref 22–32)
CREATININE: 0.8 mg/dL (ref 0.44–1.00)
Calcium: 7.3 mg/dL — ABNORMAL LOW (ref 8.9–10.3)
GFR calc Af Amer: >60 mL/min (ref >60)
GFR calc non Af Amer: >60 mL/min (ref >60)
Glucose, Bld: 117 mg/dL — ABNORMAL HIGH (ref 65–99)
Potassium: 3.9 mmol/L (ref 3.5–5.1)
SODIUM: 137 mmol/L (ref 135–145)

## 2017-01-22 LAB — HEPATIC FUNCTION PANEL
ALK PHOS: 252 U/L — AB (ref 38–126)
ALT: 65 U/L — AB (ref 14–54)
AST: 240 U/L — ABNORMAL HIGH (ref 15–41)
Albumin: 1.6 g/dL — ABNORMAL LOW (ref 3.5–5.0)
BILIRUBIN DIRECT: 6.5 mg/dL — AB (ref 0.1–0.5)
Indirect Bilirubin: 4.3 mg/dL — ABNORMAL HIGH (ref 0.3–0.9)
Total Bilirubin: 10.8 mg/dL — ABNORMAL HIGH (ref 0.3–1.2)
Total Protein: 3.8 g/dL — ABNORMAL LOW (ref 6.5–8.1)

## 2017-01-22 LAB — CBC
HEMATOCRIT: 27.3 % — AB (ref 35.0–47.0)
HEMOGLOBIN: 9 g/dL — AB (ref 12.0–16.0)
MCH: 35.6 pg — AB (ref 26.0–34.0)
MCHC: 33.1 g/dL (ref 32.0–36.0)
MCV: 107.6 fL — ABNORMAL HIGH (ref 80.0–100.0)
Platelets: 48 10*3/uL — ABNORMAL LOW (ref 150–440)
RBC: 2.54 MIL/uL — ABNORMAL LOW (ref 3.80–5.20)
RDW: 17.1 % — ABNORMAL HIGH (ref 11.5–14.5)
WBC: 27.9 10*3/uL — ABNORMAL HIGH (ref 3.6–11.0)

## 2017-01-22 LAB — PHOSPHORUS: Phosphorus: 3.5 mg/dL (ref 2.5–4.6)

## 2017-01-22 LAB — GLUCOSE, CAPILLARY
GLUCOSE-CAPILLARY: 63 mg/dL — AB (ref 65–99)
Glucose-Capillary: 123 mg/dL — ABNORMAL HIGH (ref 65–99)
Glucose-Capillary: 104 mg/dL — ABNORMAL HIGH (ref 65–99)
Glucose-Capillary: 92 mg/dL (ref 65–99)
Glucose-Capillary: 71 mg/dL (ref 65–99)

## 2017-01-22 LAB — MAGNESIUM: MAGNESIUM: 1.7 mg/dL (ref 1.7–2.4)

## 2017-01-22 MED ORDER — RIFAXIMIN 550 MG PO TABS
550.0000 mg | ORAL_TABLET | Freq: Two times a day (BID) | ORAL | Status: DC
  Administered 2017-01-22: 550 mg via ORAL
  Filled 2017-01-22: qty 1

## 2017-01-22 MED ORDER — RIFAXIMIN 550 MG PO TABS
550.0000 mg | ORAL_TABLET | Freq: Two times a day (BID) | ORAL | Status: DC
  Administered 2017-01-22 – 2017-01-31 (×18): 550 mg
  Filled 2017-01-22 (×18): qty 1

## 2017-01-22 NOTE — Progress Notes (Signed)
Pt remained on vent with precedex for comfort. Pt followed commands during the night.  Foley remains in place with 325cc out of tea colored urine. Wound care to left leg completed.  Levophed did not have to be restarted.  Tube feeds remain infusing.

## 2017-01-22 NOTE — Progress Notes (Addendum)
Pharmacy Antibiotic Note  Sonya Park is a 76 y.o. female admitted on 01/14/2017 with sepsis and e coli bacteremia.  Pharmacy has been consulted for ciprofloxacin dosing.  Plan: Continue ciprofloxacin 400 mg IV q 12 hours. Patient extubated today; will continue to monitor.   Height: 5\' 6"  (167.6 cm) Weight: 156 lb 8.4 oz (71 kg) IBW/kg (Calculated) : 59.3  Temp (24hrs), Avg:97.4 F (36.3 C), Min:96.3 F (35.7 C), Max:99 F (37.2 C)   Recent Labs Lab 01/16/17 1106 01/16/17 2225  01/17/17 1519 01/17/17 1806 01/18/17 0421  01/19/17 0512 01/19/17 1237 01/19/17 1751 01/20/17 0503 01/20/17 0508 01/21/17 0516 01/22/17 0449  WBC  --   --   < >  --  14.2* 14.7*  --   --   --   --  18.9*  --  22.6* 27.9*  CREATININE  --   --   < >  --  0.73 0.75  < > 0.81  --  0.89  --  0.78 0.93 0.80  LATICACIDVEN 10.5* 9.7*  --  6.7*  --   --   --   --  3.3*  --   --   --   --   --   < > = values in this interval not displayed.  Estimated Creatinine Clearance: 56.9 mL/min (by C-G formula based on SCr of 0.8 mg/dL).    Allergies  Allergen Reactions  . Other Itching    "mycin" doesn't know which one  . Penicillins Swelling    Has patient had a PCN reaction causing immediate rash, facial/tongue/throat swelling, SOB or lightheadedness with hypotension: Yes Has patient had a PCN reaction causing severe rash involving mucus membranes or skin necrosis: No Has patient had a PCN reaction that required hospitalization: No Has patient had a PCN reaction occurring within the last 10 years: No If all of the above answers are "NO", then may proceed with Cephalosporin use.    Antimicrobials this admission:  Levofloxacin 10/9 >> 10/10 Vancomycin 10/9 >> 10/10 Fluconazole 10/10 >> 10/10 Meropenem 10/9 >> 10/11 Ciprofloxacin 10/12 >>   Dose adjustments this admission:   Microbiology results: 10/9 BCx (x2): non ESBL e coli 10/9 BCID: e coli 10/9 UCx: <10,000 colonies/ml insignificant  growth 10/15 MRSA PCR: negative  Thank you for allowing pharmacy to be a part of this patient's care.  Oralia Manis 01/22/2017 1:28 PM

## 2017-01-22 NOTE — Progress Notes (Signed)
PULMONARY / CRITICAL CARE MEDICINE   Name: Sonya Park MRN: [id number] DOB: Dec 23, 1940    ADMISSION DATE:  01/14/2017  PT PROFILE: 76 y.o. F with h/o EtOH abuse admitted 01/14/17 with fall, facial contusions, severe sepsis. BCs positive for E coli. Intubated 10/10 for decreased LOC and worsening shock.   MAJOR EVENTS/TEST RESULTS: 10/09 Admitted via ED 10/09 CT head: Periorbital and frontal scalp soft tissue thickening, without acute intracranial abnormality. New hyperattenuating material in the left frontal sinus could represent hemorrhage. No acute fracture is seen  10/09 CT maxillofacial: Bilateral nasal bone fractures without significant displacement. Left frontal hemorrhage suggest an occult fracture. No discrete fracture is visualized 10/09 CTAP: Marked diffuse hepatic steatosis. No acute findings 10/09 CT chest: No acute findings 10/10 Worsening shock. Worsening level of consciousness. Intubated 10/13 echocardiogram: LVEF 45-50%. Mild concentric hypertrophy. Mild MR. Moderate TR. RA mildly dilated 10/16 off vasopressors. Worsening hyperbilirubinemia. Tolerates PSV mode. Cognition still poor 10/16 RUQ [place]: Single 1.4 cm gallstone with focally thickened gallbladder wall. Cannot exclude developing acute cholecystitis. Correlate clinically. Echogenic liver parenchyma consistent with fatty infiltration 10/17 Passed SBT. Extubated. Rifaximin added  INDWELLING DEVICES:: R femoral A-line 10/10 >> 10/16 ETT 10/10 >> 10/17 R IJ CVL 10/10 >>    MICRO DATA: MRSA PCR 10/15 >> NEG Urine 10/09 >> insignificant growth Blood 10/09 >> 2/2 Escherichia coli  ANTIMICROBIALS:  Vanc 10/09 >> 10/10 Aztreonam 10/09 >> 10/09 Levofloxacin 10/09 >> 10/10 Meropenem 10/09 >> 10/12 Cipro 10/12 >> 10/19 (planned stop date)   SUBJECTIVE:  Eyes open. Regards examiner. Not F/C. Passed SBT. + cuff leak. Extubated and tolerating initially  VITAL SIGNS: BP 94/63   Pulse 67   Temp 99 F (37.2 C)    Resp 13   Ht 5\' 6"  (1.676 m)   Wt 71 kg (156 lb 8.4 oz)   SpO2 94%   BMI 25.26 kg/m   HEMODYNAMICS:    VENTILATOR SETTINGS: Vent Mode: PSV FiO2 (%):  [35 %] 35 % PEEP:  [5 cmH20] 5 cmH20 Pressure Support:  [5 cmH20] 5 cmH20  INTAKE / OUTPUT: I/O last 3 completed shifts: In: 369.9 [I.V.:151.6; NG/GT:218.3] Out: 2392 [Urine:1092; Stool:1300]  PHYSICAL EXAMINATION: General: NAD, RASS -1 Neuro: CNs intact, MAEs HEENT: healing bilateral periorbital contusions, + sclericterus Cardiovascular: regular, no M Lungs: clear anteriorly Abdomen: mildly distended, BS present, NT Ext: 3+ pitting pretibial edema Skin: Multiple oozing ulcerations on BLE. Severe jaundice  LABS:  BMET  Recent Labs Lab 01/20/17 0508 01/21/17 0516 01/22/17 0449  NA 135 133* 137  K 4.6 4.5 3.9  CL 101 105 107  CO2 25 25 26   BUN 23* 31* 43*  CREATININE 0.78 0.93 0.80  GLUCOSE 120* 95 117*    Electrolytes  Recent Labs Lab 01/20/17 0503 01/20/17 0508 01/21/17 0516 01/22/17 0449  CALCIUM  --  7.0* 7.2* 7.3*  MG 1.8  --  1.8 1.7  PHOS 3.6  --  NOT CALCULATED 3.5    CBC  Recent Labs Lab 01/20/17 0503 01/21/17 0516 01/22/17 0449  WBC 18.9* 22.6* 27.9*  HGB 7.6* 8.2* 9.0*  HCT 23.0* 24.7* 27.3*  PLT 14* 30* 48*    Coag's No results for input(s): APTT, INR in the last 168 hours.  Sepsis Markers  Recent Labs Lab 01/16/17 0421  01/16/17 2225 01/17/17 0430 01/17/17 1519 01/19/17 1237  LATICACIDVEN  --   < > 9.7*  --  6.7* 3.3*  PROCALCITON 8.27  --   --  5.36  --   --   < > =  values in this interval not displayed.  ABG  Recent Labs Lab 01/16/17 1030 01/17/17 1117 01/18/17 0430  PHART 7.50* 7.51* 7.52*  PCO2ART 25* 30* 29*  PO2ART 103 79* 92    Liver Enzymes  Recent Labs Lab 01/20/17 0508 01/21/17 0516 01/22/17 0449  AST 147* 227* 240*  ALT 41 51 65*  ALKPHOS 108 166* 252*  BILITOT 10.4* 12.2* 10.8*  ALBUMIN 1.8* 1.7* 1.6*    Cardiac Enzymes No results  for input(s): TROPONINI, PROBNP in the last 168 hours.  Glucose  Recent Labs Lab 01/21/17 0724 01/21/17 1119 01/21/17 1644 01/21/17 2329 01/22/17 0747 01/22/17 1132  GLUCAP 110* 91 87 149* 123* 104*    CXR: no new film  ASSESSMENT / PLAN:  PULMONARY A: Acute, ventilator dependent respiratory failure with hypoxemia Bilateral pleural effusions - likely due to generalized volume overload Difficult intubation P:   Extubated this morning under my direction Supplemental oxygen to maintain SPO2 > 92% Monitor respiratory status closely in ICU/SDU  CARDIOVASCULAR A:  Septic shock, resolved P:  Monitor hemodynamics  RENAL A:   Anasarca Mild hyponatremia P:   Monitor BMET intermittently Monitor I/Os Correct electrolytes as indicated   GASTROINTESTINAL A:   Elevated LFTs Severe hyperbilirubinemia Dysphagia due to AMS P:   SUP: enteral pantoprazole Hold TF's today - consider resumption 10/18 NGT placed to provide medications and nutrition  HEMATOLOGIC A:   ICU acquired anemia without overt blood loss Thrombocytopenia - improving Severe macrocytosis P:  DVT px: SCDs Monitor CBC intermittently Transfuse per usual guidelines  Folate and vitamin B12 ordered - continue  INFECTIOUS A:   Severe sepsis E coli bacteremia P:   Monitor temp, WBC count Micro and abx as above  Will complete 10 days antibiotics (through 10/19)  ENDOCRINE A:   Mild hyperglycemia - resolved Relative adrenal insufficiency P:   Monitor glucose on chemistry panels Consider SSI for glu > 180 Taper hydrocortisone - dosed reduced 10/16   NEUROLOGIC A:   Acute encephalopathy - likely hepatic Severe alcohol abuse P:   RASS goal: 0 minimize all sedatives Continue lactulose Add rifaximin 10/17  FAMILY: husband updated at bedside in detail  CCM time: 45 mins The above time includes time spent in consultation with patient and/or family members and reviewing care plan on  multidisciplinary rounds  Merton Border, MD PCCM service Mobile [phone] Pager [phone]  01/22/2017, 2:36 PM

## 2017-01-22 NOTE — Progress Notes (Signed)
Placed on high fowlers position, cuff deflated, suctioned orally and then extubated to 4 lpm O2 [place]

## 2017-01-22 NOTE — Progress Notes (Signed)
Patient ID: Sonya Park, female   DOB: 03-28-1941, 76 y.o.   MRN: [id number]   [facility] PROGRESS NOTE  Sonya Park TKZ:[id number] DOB: [birth year] DOA: 01/14/2017 PCP: Sonya Athens, MD  HPI/Subjective: Patient extubated this morning. Patient starting to talk in asking for water. Heart to focus on answering specific questions still.  Objective: Vitals:   01/22/17 1500 01/22/17 1600  BP: 111/68 109/68  Pulse: 62   Resp: 13   Temp: 97.7 F (36.5 C)   SpO2: 94%     Filed Weights   01/14/17 1456 01/15/17 1530  Weight: 63 kg (139 lb) 71 kg (156 lb 8.4 oz)    ROS: Review of Systems  Unable to perform ROS: Acuity of condition  Respiratory: Negative for shortness of breath.   Gastrointestinal: Negative for abdominal pain.  Musculoskeletal: Negative for joint pain.   Exam: Physical Exam  Constitutional: She is intubated.  HENT:  Nose: No mucosal edema.  Eyes: Pupils are equal, round, and reactive to light.  Icteric.   Neck: Carotid bruit is not present. No thyromegaly present.  Cardiovascular: Regular rhythm, S1 normal and S2 normal.   Respiratory: She is intubated. She has decreased breath sounds in the right lower field and the left lower field. She has no wheezes. She has no rhonchi. She has no rales.  GI: Soft. Bowel sounds are normal. There is no tenderness.  Musculoskeletal:       Right elbow: She exhibits swelling.       Left elbow: She exhibits swelling.       Right wrist: She exhibits swelling.       Left wrist: She exhibits tenderness.       Right knee: She exhibits swelling.       Left knee: She exhibits swelling.       Right ankle: She exhibits swelling.       Left ankle: She exhibits swelling.  Neurological:  Patient intubated and sedated  Skin:  Large ulceration left posterior calf. Demarcation of the toes with a dusky look. Left third and fourth toe still brownish lookingBruising upper extremities and lower extremities.  Bruising around  bilateral eyelids.  Psychiatric:  Patient intubated and sedated      Data Reviewed: Basic Metabolic Panel:  Recent Labs Lab 01/19/17 0027 01/19/17 0512 01/19/17 1751 01/20/17 0503 01/20/17 0508 01/21/17 0516 01/22/17 0449  NA 132* 134* 134*  --  135 133* 137  K 3.8 4.2 4.5  --  4.6 4.5 3.9  CL 102 100* 102  --  101 105 107  CO2 _0 --  _1 GLUCOSE 106* 102* 100*  --  120* 95 117*  BUN 18 19 21*  --  23* 31* 43*  CREATININE 0.73 0.81 0.89  --  0.78 0.93 0.80  CALCIUM 6.1* 6.7* 6.8*  --  7.0* 7.2* 7.3*  MG 1.9 1.9  --  1.8  --  1.8 1.7  PHOS 2.8 2.8  --  3.6  --  NOT CALCULATED 3.5   Liver Function Tests:  Recent Labs Lab 01/16/17 0421 01/20/17 0508 01/21/17 0516 01/22/17 0449  AST 182* 147* 227* 240*  ALT 50 41 51 65*  ALKPHOS 89 108 166* 252*  BILITOT 3.3* 10.4* 12.2* 10.8*  PROT 3.7* 3.6* 3.8* 3.8*  ALBUMIN 1.7* 1.8* 1.7* 1.6*    Recent Labs Lab 01/17/17 1225 01/18/17 0421  AMMONIA 52* 54*   CBC:  Recent Labs Lab 01/17/17 1225 01/17/17 1806  01/18/17 0421 01/20/17 0503 01/21/17 0516 01/22/17 0449  WBC 14.1* 14.2* 14.7* 18.9* 22.6* 27.9*  NEUTROABS 13.0* 13.0* 13.4*  --   --   --   HGB 10.6* 10.4* 9.9* 7.6* 8.2* 9.0*  HCT 31.3* 30.4* 29.2* 23.0* 24.7* 27.3*  MCV 107.3* 105.6* 105.1* 107.3* 107.8* 107.6*  PLT 20* 16* 11* 14* 30* 48*    CBG:  Recent Labs Lab 01/21/17 1119 01/21/17 1644 01/21/17 2329 01/22/17 0747 01/22/17 1132  GLUCAP 91 87 149* 123* 104*    Recent Results (from the past 240 hour(s))  Blood Culture (routine x 2)     Status: Abnormal   Collection Time: 01/14/17 11:08 PM  Result Value Ref Range Status   Specimen Description BLOOD LFOA  Final   Special Requests   Final    BOTTLES DRAWN AEROBIC AND ANAEROBIC Blood Culture results may not be optimal due to an excessive volume of blood received in culture bottles   Culture  Setup Time   Final    GRAM NEGATIVE RODS IN BOTH AEROBIC AND ANAEROBIC  BOTTLES CRITICAL RESULT CALLED TO, READ BACK BY AND VERIFIED WITH:  Sonya Park AT 1207 01/15/17 SDR    Culture ESCHERICHIA COLI (A)  Final   Report Status 01/17/2017 FINAL  Final   Organism ID, Bacteria ESCHERICHIA COLI  Final      Susceptibility   Escherichia coli - MIC*    AMPICILLIN >=32 RESISTANT Resistant     CEFAZOLIN <=4 SENSITIVE Sensitive     CEFEPIME <=1 SENSITIVE Sensitive     CEFTAZIDIME <=1 SENSITIVE Sensitive     CEFTRIAXONE <=1 SENSITIVE Sensitive     CIPROFLOXACIN 1 SENSITIVE Sensitive     GENTAMICIN <=1 SENSITIVE Sensitive     IMIPENEM <=0.25 SENSITIVE Sensitive     TRIMETH/SULFA >=320 RESISTANT Resistant     AMPICILLIN/SULBACTAM >=32 RESISTANT Resistant     PIP/TAZO <=4 SENSITIVE Sensitive     Extended ESBL NEGATIVE Sensitive     * ESCHERICHIA COLI  Blood Culture (routine x 2)     Status: Abnormal   Collection Time: 01/14/17 11:08 PM  Result Value Ref Range Status   Specimen Description BLOOD LAC  Final   Special Requests   Final    BOTTLES DRAWN AEROBIC AND ANAEROBIC Blood Culture results may not be optimal due to an excessive volume of blood received in culture bottles   Culture  Setup Time   Final    GRAM NEGATIVE RODS IN BOTH AEROBIC AND ANAEROBIC BOTTLES CRITICAL VALUE NOTED.  VALUE IS CONSISTENT WITH PREVIOUSLY REPORTED AND CALLED VALUE.    Culture (A)  Final    ESCHERICHIA COLI SUSCEPTIBILITIES PERFORMED ON PREVIOUS CULTURE WITHIN THE LAST 5 DAYS. Performed at [facility], [place] [address] [place], [place] [address]    Report Status 01/17/2017 FINAL  Final  Urine culture     Status: Abnormal   Collection Time: 01/14/17 11:08 PM  Result Value Ref Range Status   Specimen Description URINE, RANDOM  Final   Special Requests NONE  Final   Culture (A)  Final    <10,000 COLONIES/mL INSIGNIFICANT GROWTH Performed at [facility], [place] [address] [place], [place] [address]    Report Status 01/16/2017 FINAL  Final  Blood Culture ID  Panel (Reflexed)     Status: Abnormal   Collection Time: 01/14/17 11:08 PM  Result Value Ref Range Status   Enterococcus species NOT DETECTED NOT DETECTED Final   Listeria monocytogenes NOT DETECTED NOT  DETECTED Final   Staphylococcus species NOT DETECTED NOT DETECTED Final   Staphylococcus aureus NOT DETECTED NOT DETECTED Final   Streptococcus species NOT DETECTED NOT DETECTED Final   Streptococcus agalactiae NOT DETECTED NOT DETECTED Final   Streptococcus pneumoniae NOT DETECTED NOT DETECTED Final   Streptococcus pyogenes NOT DETECTED NOT DETECTED Final   Acinetobacter baumannii NOT DETECTED NOT DETECTED Final   Enterobacteriaceae species DETECTED (A) NOT DETECTED Final    Comment: Enterobacteriaceae represent a large family of gram-negative bacteria, not a single organism. CRITICAL RESULT CALLED TO, READ BACK BY AND VERIFIED WITH:  Sonya Park AT 1207 01/15/17 SDR    Enterobacter cloacae complex NOT DETECTED NOT DETECTED Final   Escherichia coli DETECTED (A) NOT DETECTED Final    Comment: CRITICAL RESULT CALLED TO, READ BACK BY AND VERIFIED WITH:  Sonya Park AT 1207 01/15/17 SDR    Klebsiella oxytoca NOT DETECTED NOT DETECTED Final   Klebsiella pneumoniae NOT DETECTED NOT DETECTED Final   Proteus species NOT DETECTED NOT DETECTED Final   Serratia marcescens NOT DETECTED NOT DETECTED Final   Carbapenem resistance NOT DETECTED NOT DETECTED Final   Haemophilus influenzae NOT DETECTED NOT DETECTED Final   Neisseria meningitidis NOT DETECTED NOT DETECTED Final   Pseudomonas aeruginosa NOT DETECTED NOT DETECTED Final   Candida albicans NOT DETECTED NOT DETECTED Final   Candida glabrata NOT DETECTED NOT DETECTED Final   Candida krusei NOT DETECTED NOT DETECTED Final   Candida parapsilosis NOT DETECTED NOT DETECTED Final   Candida tropicalis NOT DETECTED NOT DETECTED Final  MRSA PCR Screening     Status: None   Collection Time: 01/20/17 11:04 AM  Result Value Ref Range Status    MRSA by PCR NEGATIVE NEGATIVE Final    Comment:        The GeneXpert MRSA Assay (FDA approved for NASAL specimens only), is one component of a comprehensive MRSA colonization surveillance program. It is not intended to diagnose MRSA infection nor to guide or monitor treatment for MRSA infections.      Studies: Dg Abd 1 View  Result Date: 01/22/2017 CLINICAL DATA:  Feeding tube placement. EXAM: ABDOMEN - 1 VIEW COMPARISON:  Supine abdominal radiograph of January 17, 2017 FINDINGS: A he radiodense tipped feeding tube is present. The tip of the tube appears to lie in the proximal duodenum. IMPRESSION: The feeding tube tip overlies the proximal duodenum. Electronically Signed   By: David  Martinique M.D.   On: 01/22/2017 12:00   Dg Chest Port 1 View  Result Date: 01/22/2017 CLINICAL DATA:  Hyperbilirubinemia. EXAM: PORTABLE CHEST 1 VIEW COMPARISON:  Radiograph January 20, 2017. FINDINGS: Stable cardiomediastinal silhouette. Atherosclerosis of thoracic aorta is noted. Endotracheal and nasogastric tubes are unchanged in position. Right internal jugular catheter is unchanged with distal tip in expected position of the SVC. No pneumothorax is noted. Stable bilateral pleural effusions are noted, left greater than right, with probable associated atelectasis. Bony thorax is unremarkable. IMPRESSION: Aortic atherosclerosis. Stable support apparatus. Stable bilateral pleural effusions are noted with probable associated atelectasis. Electronically Signed   By: Marijo Conception, M.D.   On: 01/22/2017 07:42   US Abdomen Limited Ruq  Result Date: 01/21/2017 CLINICAL DATA:  Elevated bilirubin EXAM: ULTRASOUND ABDOMEN LIMITED RIGHT UPPER QUADRANT COMPARISON:  CT abdomen pelvis of 01/14/2017 FINDINGS: Gallbladder: There is a single gallstone within the neck of the gallbladder of 1.4 cm. There is no pain over the gallbladder, but there is some thickening of the gallbladder wall. Developing acute cholecystitis  cannot be excluded. Common bile duct: Diameter: The common bile duct measures 5.8 mm in diameter. Liver: The parenchyma of the liver is echogenic and inhomogeneous consistent with diffuse fatty infiltration. No focal hepatic abnormality is seen. Portal vein is patent on color Doppler imaging with normal direction of blood flow towards the liver. Small amount of ascites is noted adjacent to the liver as well as a small amount within all 4 quadrants. IMPRESSION: 1. Single 1.4 cm gallstone with focally thickened gallbladder wall. Cannot exclude developing acute cholecystitis. Correlate clinically. 2. Echogenic liver parenchyma consistent with fatty infiltration. 3. Some ascites is noted as described above. Electronically Signed   By: Ivar Drape M.D.   On: 01/21/2017 16:52    Scheduled Meds: . chlorhexidine gluconate (MEDLINE KIT)  15 mL Mouth Rinse BID  . hydrocortisone sod succinate (SOLU-CORTEF) inj  25 mg Intravenous Q12H  . lactulose  30 g Per Tube BID  . mouth rinse  15 mL Mouth Rinse 10 times per day  . pantoprazole sodium  40 mg Per Tube Daily  . phytonadione  10 mg Per Tube Daily  . rifaximin  550 mg Per Tube BID  . sodium chloride flush  10-40 mL Intracatheter Q12H  . thiamine  100 mg Per Tube Daily  . vitamin A  20,000 Units Oral Daily  . vitamin B-12  1,000 mcg Per Tube Daily  . zinc sulfate  220 mg Per Tube Daily   Continuous Infusions: . ciprofloxacin 400 mg (01/22/17 1459)    Assessment/Plan:  1. Septic shock with Escherichia coli. Patient off pressors at this point.Tapering stress dose steroids.  Patient on IV Cipro. 2. Acute respiratory failure with hypoxia. Patient extubated 01/22/2017 3. Alcohol abuse. Likely would be through with any withdrawal processhile she was on the ventilator.   4. Acute encephalopathy secondary to severe sepsis.  Patient started on lactulose and right And just in case this was secondary tohepatic encephalopthy 5. Pancytopenia could be secondary to  alcohol abuse and/or sepsis. Platelet count continuing to recover. 6. Hypomagnesemia, hypocalcemia and hypokalemia. Electrolytes have been replaced. 7. Severe malnutrition, Anasarca and third spacing of fluids.  Patient held on tube feeding with extubation. 8. Jaundice likely secondary to shock. Ultrasound the liver ordered by critical care specialist. Hepatic steatosis seen on previous CT scan. 9. Facial trauma at home. Fractured nasal bones. Hematoma and bruising around the eyelids.  Code Status:     Code Status Orders        Start     Ordered   01/15/17 0158  Full code  Continuous     01/15/17 0157    Code Status History    Date Active Date Inactive Code Status Order ID Comments User Context   12/13/2015 11:49 AM 12/15/2015  7:28 PM Full Code [id number]  Dereck Leep, MD Inpatient   09/17/2014  6:01 AM 09/18/2014  3:51 PM Full Code [id number]  Juluis Mire, MD Inpatient     Family Communication: spoke with husband at the bedside Disposition Plan: potential candidate for LTAC  Consultants:  Critical care specialist  Antibiotics:  Cipro  Time spent: 23 minutes  Loletha Grayer  [employer]

## 2017-01-23 ENCOUNTER — Inpatient Hospital Stay: Payer: Medicare Other

## 2017-01-23 DIAGNOSIS — K7290 Hepatic failure, unspecified without coma: Secondary | ICD-10-CM

## 2017-01-23 LAB — COMPREHENSIVE METABOLIC PANEL
ALT: 79 U/L — ABNORMAL HIGH (ref 14–54)
AST: 229 U/L — ABNORMAL HIGH (ref 15–41)
Albumin: 1.9 g/dL — ABNORMAL LOW (ref 3.5–5.0)
Alkaline Phosphatase: 336 U/L — ABNORMAL HIGH (ref 38–126)
Anion gap: 9 (ref 5–15)
BUN: 55 mg/dL — ABNORMAL HIGH (ref 6–20)
CO2: 23 mmol/L (ref 22–32)
Calcium: 7.7 mg/dL — ABNORMAL LOW (ref 8.9–10.3)
Chloride: 106 mmol/L (ref 101–111)
Creatinine, Ser: 0.95 mg/dL (ref 0.44–1.00)
GFR calc Af Amer: >60 mL/min (ref >60)
GFR calc non Af Amer: 57 mL/min — ABNORMAL LOW (ref >60)
Glucose, Bld: 78 mg/dL (ref 65–99)
Potassium: 4 mmol/L (ref 3.5–5.1)
Sodium: 138 mmol/L (ref 135–145)
Total Bilirubin: 9.3 mg/dL — ABNORMAL HIGH (ref 0.3–1.2)
Total Protein: 4.7 g/dL — ABNORMAL LOW (ref 6.5–8.1)

## 2017-01-23 LAB — CBC
HEMATOCRIT: 32.2 % — AB (ref 35.0–47.0)
Hemoglobin: 10.4 g/dL — ABNORMAL LOW (ref 12.0–16.0)
MCH: 35.3 pg — AB (ref 26.0–34.0)
MCHC: 32.3 g/dL (ref 32.0–36.0)
MCV: 109.3 fL — AB (ref 80.0–100.0)
Platelets: 79 10*3/uL — ABNORMAL LOW (ref 150–440)
RBC: 2.95 MIL/uL — AB (ref 3.80–5.20)
RDW: 18.4 % — ABNORMAL HIGH (ref 11.5–14.5)
WBC: 33.9 10*3/uL — AB (ref 3.6–11.0)

## 2017-01-23 LAB — GLUCOSE, CAPILLARY
GLUCOSE-CAPILLARY: 87 mg/dL (ref 65–99)
GLUCOSE-CAPILLARY: 69 mg/dL (ref 65–99)
Glucose-Capillary: 88 mg/dL (ref 65–99)
Glucose-Capillary: 100 mg/dL — ABNORMAL HIGH (ref 65–99)
Glucose-Capillary: 139 mg/dL — ABNORMAL HIGH (ref 65–99)
Glucose-Capillary: 147 mg/dL — ABNORMAL HIGH (ref 65–99)

## 2017-01-23 LAB — PHOSPHORUS: Phosphorus: 3.8 mg/dL (ref 2.5–4.6)

## 2017-01-23 LAB — MAGNESIUM: Magnesium: 2.1 mg/dL (ref 1.7–2.4)

## 2017-01-23 MED ORDER — LACTULOSE 10 GM/15ML PO SOLN
20.0000 g | Freq: Two times a day (BID) | ORAL | Status: DC
  Administered 2017-01-23: 20 g
  Filled 2017-01-23: qty 30

## 2017-01-23 MED ORDER — TRAMADOL HCL 50 MG PO TABS
50.0000 mg | ORAL_TABLET | Freq: Four times a day (QID) | ORAL | Status: DC | PRN
  Administered 2017-01-23 – 2017-01-24 (×3): 50 mg
  Filled 2017-01-23 (×3): qty 1

## 2017-01-23 MED ORDER — DEXTROSE 50 % IV SOLN
INTRAVENOUS | Status: AC
  Administered 2017-01-23: 50 mL
  Filled 2017-01-23: qty 50

## 2017-01-23 MED ORDER — PRO-STAT SUGAR FREE PO LIQD
30.0000 mL | Freq: Two times a day (BID) | ORAL | Status: DC
  Administered 2017-01-23 – 2017-01-30 (×16): 30 mL

## 2017-01-23 MED ORDER — FOLIC ACID 1 MG PO TABS
1.0000 mg | ORAL_TABLET | Freq: Every day | ORAL | Status: DC
  Administered 2017-01-23 – 2017-01-31 (×9): 1 mg
  Filled 2017-01-23 (×9): qty 1

## 2017-01-23 MED ORDER — VITAL HIGH PROTEIN PO LIQD: 1000.0000 mL | ORAL | Status: DC

## 2017-01-23 MED ORDER — ACETAMINOPHEN 325 MG PO TABS
650.0000 mg | ORAL_TABLET | ORAL | Status: DC | PRN
  Administered 2017-01-23 (×2): 650 mg via ORAL
  Filled 2017-01-23 (×2): qty 2

## 2017-01-23 MED ORDER — OSMOLITE 1.5 CAL PO LIQD
1000.0000 mL | ORAL | Status: DC
  Administered 2017-01-23 – 2017-01-25 (×2): 1000 mL

## 2017-01-23 MED ORDER — FUROSEMIDE 10 MG/ML IJ SOLN
40.0000 mg | Freq: Once | INTRAMUSCULAR | Status: AC
  Administered 2017-01-23: 40 mg via INTRAVENOUS
  Filled 2017-01-23: qty 4

## 2017-01-23 MED ORDER — ADULT MULTIVITAMIN LIQUID CH
15.0000 mL | Freq: Every day | ORAL | Status: DC
  Administered 2017-01-23 – 2017-01-31 (×9): 15 mL
  Filled 2017-01-23 (×10): qty 15

## 2017-01-23 MED ORDER — COPPER GLUCONATE 2 MG PO TABS
1.0000 | ORAL_TABLET | Freq: Every day | ORAL | Status: DC
  Administered 2017-01-31 – 2017-02-10 (×11): 1 via ORAL
  Filled 2017-01-23 (×22): qty 1

## 2017-01-23 NOTE — Progress Notes (Signed)
Nutrition Follow-up  DOCUMENTATION CODES:   Non-severe (moderate) malnutrition in context of chronic illness  INTERVENTION:  Recommend new goal tube feed regimen of Osmolite 1.5 at 40 ml/hr + Pro-Stat 30 ml BID. Provides 1640 kcal, 90 grams of protein 730 ml H2O daily.  Also recommend liquid multivitamin with minerals per tube daily as goal TF regimen does not meet 100% RDIs for vitamins/minerals.  Recommend free water flush of 130 ml Q4hrs. Provides total of 1510 ml H2O including water in tube feeds.  Recommend measuring daily weights.  On recent follow-up with Dr. Duke Salvia, patient was found to be deficient in copper, zinc, vitamin K, and vitamin A. -Recommend 16 mg elemental zinc PO for 6 months (200% RDI), 2 mg/day IV copper for 6 days (followed by 1 mg PO daily until zinc supplementation is complete), 10000-25000 IU/day vitamin A orally/per tube for 1-2 weeks, 1-2 mg/day vitamin K orally/per tube. -Micronutrient supplementation was started on 10/15. Recommend continuing supplementation of vitamin A and vitamin K until 10/22. Continue zinc supplementation for 6 months (and appropriate dose of copper to prevent recurrence of copper deficiency - recommended ratio is 1 mg copper for each 8-15 mg elemental zinc). -Patient will need close monitoring of nutrition-related labs every 3 months.  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  New nutrition diagnosis.  GOAL:   Patient will meet greater than or equal to 90% of their needs  Progressing with initiation of TF regimen.  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 Intubated on 10/10.  -Patient s/p RUQ [place] on 10/16, which found 1.4 cm gallstone with focally thickened gallbladder wall, echogenic liver parenchyma consistent with fatty infiltration, small amount of ascites. -Patient was extubated on 10/17 after passing SBT. -OGT was replaced with NGT on 10/17. -Following SLP evaluation today patient was advanced to dysphagia 1 diet with nectar-thick liquids.  Spoke with patient and her husband at bedside. Husband was feeding patient some applesauce and sips of nectar-thick liquids. Patient endorses her gastric bypass was in 2012 with Dr. Duke Salvia. She reports that prior to her operation she weighed 255 lbs. At first she had a steady, healthy weight loss after surgery. Then after her right TKA (has also previously had a left TKA), she developed a loss of appetite (anorexia) and was not able to eat well. She began losing more weight and was unable to put it back on. Her doctor started her on an appetite stimulant and had her drink one Boost daily, which she did not like. Patient and husband are aware of her vitamin deficiencies found at latest follow-up with Dr. Duke Salvia and endorse she was started on some calcium citrate and vitamin D. After surgery she was also taking a bariatric multivitamin,  but has not been taking one since. Patient has had some difficulty with chewing and swallowing in the past. She reports physicians were considering  dilating her esophagus at one point.  Access: 10 Fr. NGT with weighted Dobbhoff tip placed 10/17 (documented as 14 Fr., which is not correct); terminates in proximal duodenum per abdominal x-ray on 10/17; 92 cm at right nare per chart, but on assessment did not appear to be in that far, so will continue to monitor   Medications reviewed and include: folic acid 1 mg daily, lactulose 20 grams BID, pantoprazole, vitamin K 10 mg daily per tube, thiamine 100 mg daily per tube, vitamin A 20000 units daily PO, vitamin B12 1000 micrograms daily per tube, zinc sulfate capsule 220 mg daily per tube, ciprofloxacin.  Labs reviewed: CBG 63-100, BUN 55, Alkaline Phosphatase 336, AST 229, ALT79, T Bili 9.3. Potassium, Phosphorus, and Magnesium all WNL.  Completed another more thorough physical exam as patient now extubated. Nutrition-Focused physical exam completed. Findings are moderate fat depletion (moderate depletion of orbital region and thoracic/lumbar region; unable to assess upper arms in setting of edema), moderate muscle depletion (moderate depletion of temple, clavicle bone region, clavicle/acromion bone region, scapular bone region, dorsal hand region; unable to assess patellar, anterior thigh, or posterior calf well in setting of edema), and severe/deep pitting edema to bilateral upper and lower extremities (indention does rebound in <15 seconds, though).   I/O 0700 10/17 to 0700 10/18: 475 ml UOP (0.3 ml/kg/hr - inadequate); 800 ml output in rectal tube  Weight trend: 71 kg on 10/10, no subsequent weight since; will continue to use weight of 61.7 kg from 12/31/2016 to estimate needs as weights from this admission likely falsely elevated with edema  Discussed with RN and SLP. Also discussed on rounds. Patient will be started on a diet for pleasure. Nutrition needs will be met with initiation of tube feeds today.  Diet Order:  DIET - DYS 1 Room service appropriate? Yes; Fluid consistency: Nectar  Thick  Skin:  Wound (see comment) (Stg I buttocks, weeping to arms and legs, cellulitis lower legs, blisters arms and legs, skin tear left leg)  Last BM:  01/23/2017 - medium type 7 in rectal tube  Height:   Ht Readings from Last 1 Encounters:  01/14/17 5' 6" (1.676 m)    Weight:   Wt Readings from Last 1 Encounters:  01/15/17 156 lb 8.4 oz (71 kg)    Ideal Body Weight:  59.1 kg  BMI:  Body mass index is 25.26 kg/m.  Estimated Nutritional Needs:   Kcal:  1475-1700 (MSJ x 1.3-1.5)  Protein:  85-100 grams (1.4-1.6 grams/kg)  Fluid:  1.5-1.8 L/day (25-30 ml/kg)  EDUCATION NEEDS:   Education needs addressed  Willey Blade, [place], [address], [place] Office: [phone] Pager: [phone] After Hours/Weekend Pager: [phone]

## 2017-01-23 NOTE — Evaluation (Signed)
Physical Therapy Evaluation Patient Details Name: Sonya Park MRN: [id number] DOB: 1940-05-31 Today's Date: 01/23/2017   History of Present Illness  76 y/o female who suffered a fall with considerable facial fractures and ended up being intubated 10/10-10/17 in CCU.  Pt is very, very weak at time of PT exam.  Clinical Impression  Pt able to answer some basic questions but needed extra time and generally was not able to answer with any complexity.  She did follow basic instructions for exercises (~15 minutes apart from the PT exam) but needed a lot of cuing and repeated explanation.  She did not report pain during session, but does have significant bruising in face/forehead and has complained of significant headache.  She showed good effort with exercises and despite fatigue was willing to try getting to sitting.  Pt needed heavy assist to get to and maintain sitting for ~3 minutes along with other therapeutic activities/education.  Pt will needed extensive rehab secondary to profound weakness and having been on the vent ~1 week.  Pt very willing to participate despite confusion, lethargy and quickly fatiguing.     Follow Up Recommendations SNF    Equipment Recommendations  Rolling walker with 5" wheels    Recommendations for Other Services       Precautions / Restrictions Precautions Precautions: Fall Restrictions Weight Bearing Restrictions: No      Mobility  Bed Mobility Overal bed mobility: Needs Assistance Bed Mobility: Supine to Sit;Sit to Supine     Supine to sit: Total assist Sit to supine: Total assist   General bed mobility comments: Pt unable to maintain balance sitting at EOB, needed constant heavy assist.  Pt seemed to be showing some effort, but ultimatley was very weak and limited.  Transfers                 General transfer comment: unable, unsafe  Ambulation/Gait                Stairs            Wheelchair Mobility    Modified  Rankin (Stroke Patients Only)       Balance Overall balance assessment: Needs assistance Sitting-balance support: Bilateral upper extremity supported Sitting balance-Leahy Scale: Zero Sitting balance - Comments: Pt needing heavy assist just to remain somewhat upright at EOB                                     Pertinent Vitals/Pain Pain Score: 0-No pain Pain Location: pt had been having severe headache    Home Living Family/patient expects to be discharged to:: Skilled nursing facility                      Prior Function Level of Independence: Independent         Comments: Pt able to do in-home distances/activities, apparently she struggled with any prolonged (>5 minutes) walking, running, errands, etd     Hand Dominance        Extremity/Trunk Assessment   Upper Extremity Assessment Upper Extremity Assessment: Generalized weakness (grossly 2-/5, very limited functional AROM)    Lower Extremity Assessment Lower Extremity Assessment: Generalized weakness (grossly 3-/5, able to do some AROM, nothing against gravity)       Communication   Communication: Expressive difficulties (Pt still not herself after extubation yesterday)  Cognition Arousal/Alertness: Lethargic Behavior During Therapy: Flat affect Overall Cognitive  Status: Impaired/Different from baseline                                 General Comments: husband reports that she does not at all seem like herself,       General Comments      Exercises General Exercises - Lower Extremity Ankle Circles/Pumps: AROM;AAROM;5 reps Short Arc Quad: AROM;10 reps Heel Slides: AAROM;5 reps Hip ABduction/ADduction: AAROM;5 reps   Assessment/Plan    PT Assessment Patient needs continued PT services  PT Problem List Decreased strength;Decreased range of motion;Decreased activity tolerance;Decreased balance;Decreased mobility;Decreased coordination;Decreased cognition;Decreased  knowledge of use of DME;Decreased safety awareness;Cardiopulmonary status limiting activity;Pain       PT Treatment Interventions DME instruction;Gait training;Functional mobility training;Therapeutic activities;Therapeutic exercise;Neuromuscular re-education;Balance training;Cognitive remediation;Patient/family education    PT Goals (Current goals can be found in the Care Plan section)  Acute Rehab PT Goals Patient Stated Goal: get stronger PT Goal Formulation: With family Time For Goal Achievement: 02/06/17 Potential to Achieve Goals: Fair    Frequency Min 2X/week   Barriers to discharge        Co-evaluation               AM-PAC PT "6 Clicks" Daily Activity  Outcome Measure Difficulty turning over in bed (including adjusting bedclothes, sheets and blankets)?: Unable Difficulty moving from lying on back to sitting on the side of the bed? : Unable Difficulty sitting down on and standing up from a chair with arms (e.g., wheelchair, bedside commode, etc,.)?: Unable Help needed moving to and from a bed to chair (including a wheelchair)?: Total Help needed walking in hospital room?: Total Help needed climbing 3-5 steps with a railing? : Total 6 Click Score: 6    End of Session Equipment Utilized During Treatment: Oxygen Activity Tolerance: Patient limited by lethargy;Patient limited by fatigue Patient left: with nursing/sitter in room;in bed;with family/visitor present;with call bell/phone within reach Nurse Communication: Mobility status PT Visit Diagnosis: Muscle weakness (generalized) (M62.81);Difficulty in walking, not elsewhere classified (R26.2)    Time: 5993-5701 PT Time Calculation (min) (ACUTE ONLY): 41 min   Charges:   PT Evaluation $PT Eval Low Complexity: 1 Low PT Treatments $Therapeutic Exercise: 8-22 mins $Therapeutic Activity: 8-22 mins   PT G Codes:        Kreg Shropshire, DPT 01/23/2017, 3:57 PM

## 2017-01-23 NOTE — Progress Notes (Addendum)
PULMONARY / CRITICAL CARE MEDICINE   Name: Sonya Park MRN: [id number] DOB: [birth year]    ADMISSION DATE:  01/14/2017  PT PROFILE: 76 y.o. F with h/o EtOH abuse admitted 01/14/17 with fall, facial contusions, severe sepsis. BCs positive for E coli. Intubated 10/10 for decreased LOC and worsening shock.   MAJOR EVENTS/TEST RESULTS: 10/09 Admitted via ED 10/09 CT head: Periorbital and frontal scalp soft tissue thickening, without acute intracranial abnormality. New hyperattenuating material in the left frontal sinus could represent hemorrhage. No acute fracture is seen  10/09 CT maxillofacial: Bilateral nasal bone fractures without significant displacement. Left frontal hemorrhage suggest an occult fracture. No discrete fracture is visualized 10/09 CTAP: Marked diffuse hepatic steatosis. No acute findings 10/09 CT chest: No acute findings 10/10 Worsening shock. Worsening level of consciousness. Intubated 10/13 echocardiogram: LVEF 45-50%. Mild concentric hypertrophy. Mild MR. Moderate TR. RA mildly dilated 10/16 off vasopressors. Worsening hyperbilirubinemia. Tolerates PSV mode. Cognition still poor 10/16 RUQ [place]: Single 1.4 cm gallstone with focally thickened gallbladder wall. Cannot exclude developing acute cholecystitis. Correlate clinically. Echogenic liver parenchyma consistent with fatty infiltration 10/17 Passed SBT. Extubated. Rifaximin added 10/18 Much more responsive. Somewhat conversant. No distress. Changed to SDU status. PT eval requested 10/19 SLP eval: recommend pured diet with nectar thick liquids. Tube feedings initiated in anticipation that she will not be able to meet her nutritional needs by mouth  INDWELLING DEVICES:: R femoral A-line 10/10 >> 10/16 ETT 10/10 >> 10/17 R IJ CVL 10/10 >>    MICRO DATA: MRSA PCR 10/15 >> NEG Urine 10/09 >> insignificant growth Blood 10/09 >> 2/2 Escherichia coli  ANTIMICROBIALS:  Vanc 10/09 >> 10/10 Aztreonam 10/09 >>  10/09 Levofloxacin 10/09 >> 10/10 Meropenem 10/09 >> 10/12 Cipro 10/12 >> 10/19 (planned stop date)   SUBJECTIVE:  RASS 0, no distress. Conversant. Poorly oriented  VITAL SIGNS: BP (!) 156/78   Pulse (!) 108   Temp 99.9 F (37.7 C)   Resp 20   Ht 5\' 6"  (1.676 m)   Wt 71 kg (156 lb 8.4 oz)   SpO2 94%   BMI 25.26 kg/m   HEMODYNAMICS:    VENTILATOR SETTINGS:    INTAKE / OUTPUT: I/O last 3 completed shifts: In: 426.9 [I.V.:226.9; IV Piggyback:200] Out: 2100 [Urine:800; Stool:1300]  PHYSICAL EXAMINATION: General: NAD, RASS 0 Neuro: CNs intact, MAEs HEENT: healing bilateral periorbital contusions, + sclericterus Cardiovascular: regular, no M Lungs: clear anteriorly Abdomen: NTND, BS present Ext: 2+ pitting pretibial edema Skin: Multiple oozing ulcerations on BLE. Severe jaundice  LABS:  BMET  Recent Labs Lab 01/21/17 0516 01/22/17 0449 01/23/17 0321  NA 133* 137 138  K 4.5 3.9 4.0  CL 105 107 106  CO2 25 26 23   BUN 31* 43* 55*  CREATININE 0.93 0.80 0.95  GLUCOSE 95 117* 78    Electrolytes  Recent Labs Lab 01/21/17 0516 01/22/17 0449 01/23/17 0321  CALCIUM 7.2* 7.3* 7.7*  MG 1.8 1.7 2.1  PHOS NOT CALCULATED 3.5 3.8    CBC  Recent Labs Lab 01/21/17 0516 01/22/17 0449 01/23/17 0321  WBC 22.6* 27.9* 33.9*  HGB 8.2* 9.0* 10.4*  HCT 24.7* 27.3* 32.2*  PLT 30* 48* 79*    Coag's No results for input(s): APTT, INR in the last 168 hours.  Sepsis Markers  Recent Labs Lab 01/16/17 2225 01/17/17 0430 01/17/17 1519 01/19/17 1237  LATICACIDVEN 9.7*  --  6.7* 3.3*  PROCALCITON  --  5.36  --   --     ABG  Recent Labs Lab 01/17/17 1117 01/18/17 0430  PHART 7.51* 7.52*  PCO2ART 30* 29*  PO2ART 79* 92    Liver Enzymes  Recent Labs Lab 01/21/17 0516 01/22/17 0449 01/23/17 0321  AST 227* 240* 229*  ALT 51 65* 79*  ALKPHOS 166* 252* 336*  BILITOT 12.2* 10.8* 9.3*  ALBUMIN 1.7* 1.6* 1.9*    Cardiac Enzymes No results for  input(s): TROPONINI, PROBNP in the last 168 hours.  Glucose  Recent Labs Lab 01/22/17 1641 01/22/17 2041 01/22/17 2329 01/23/17 0355 01/23/17 0714 01/23/17 0829  GLUCAP 92 63* 71 87 69 88    CXR: no new film  ASSESSMENT / PLAN:  PULMONARY A: Acute, ventilator dependent respiratory failure - resolved Bilateral pleural effusions - mproving Difficult intubation P:   Continue supplemental oxygen to maintain SPO2 > 92%  CARDIOVASCULAR A:  Septic shock, resolved Poor venous access P:  Monitor hemodynamics Will need to consider PICC line  RENAL A:   Anasarca Mild hyponatremia P:   Monitor BMET intermittently Monitor I/Os Correct electrolytes as indicated  Furosemide 1 10/18  GASTROINTESTINAL A:   Elevated LFTs Severe hyperbilirubinemia Dysphagia due to AMS P:   SUP: enteral pantoprazole Resume tube feedings Pured diet with nectar thick liquids per SLP  HEMATOLOGIC A:   ICU acquired anemia without overt blood loss Thrombocytopenia - improving Severe macrocytosis P:  DVT px: SCDs Monitor CBC intermittently Transfuse per usual guidelines  Cont folate and vitamin B12   INFECTIOUS A:   Severe sepsis E coli bacteremia Worsening leukocytosis - likely related to bone marrow recovery P:   Monitor temp, WBC count Micro and abx as above  Complete 10 day course of antibiotics Recheck pro-calcitonin 10/19  ENDOCRINE A:   Mild hyperglycemia - resolved Relative adrenal insufficiency P:   Monitor glucose on chemistry panels Consider SSI for glu > 180 Discontinue hydrocortisone 10/18  NEUROLOGIC A:   Acute encephalopathy - likely hepatic Severe alcohol abuse Headache  P:   RASS goal: 0 minimize all sedatives Tramadol when necessary Continue lactulose, rifaximin  FAMILY: husband updated at bedside in detail  Merton Border, MD PCCM service Mobile [phone] Pager [phone]  01/23/2017, 11:29 AM

## 2017-01-23 NOTE — Evaluation (Signed)
Clinical/Bedside Swallow Evaluation Patient Details  Name: Sonya Park MRN: [id number] Date of Birth: Aug 14, 1940  Today's Date: 01/23/2017 Time: SLP Start Time (ACUTE ONLY): 0930 SLP Stop Time (ACUTE ONLY): 1030 SLP Time Calculation (min) (ACUTE ONLY): 60 min  Past Medical History:  Past Medical History:  Diagnosis Date  . Arthritis   . Back pain   . Insomnia   . Medical history non-contributory   . Osteoporosis    Past Surgical History:  Past Surgical History:  Procedure Laterality Date  . ABDOMINAL HYSTERECTOMY    . BREAST LUMPECTOMY Right   . COLONOSCOPY WITH PROPOFOL N/A 07/18/2015   Procedure: COLONOSCOPY WITH PROPOFOL;  Surgeon: [name], MD;  Location: [facility];  Service: Endoscopy;  Laterality: N/A;  . COSMETIC SURGERY    . GASTRIC BYPASS     X2  . HEMORROIDECTOMY    . KNEE ARTHROPLASTY Right 12/13/2015   Procedure: COMPUTER ASSISTED TOTAL KNEE ARTHROPLASTY;  Surgeon: Dereck Leep, MD;  Location: [facility] ORS;  Service: Orthopedics;  Laterality: Right;  . KNEE ARTHROSCOPY    . REPLACEMENT TOTAL KNEE Left    HPI:  76 year old female admitted 01/14/17 after a fall outside at home. PMH noncontributory. Husband reported pt was considering esophageal dilation prior to admit. Pt intubated 10/10-17/18.   Assessment / Plan / Recommendation Clinical Impression  Oral care completed with suction. Pt presents with clear but low intensity voice quality, weak volitional cough, and reduced laryngeal elevation per palpation. Velar elevation appeared asymmetrical (left lower than right), but CN exam WNL otherwise. Pt was slowly given trials of ice chips, thin liquid, nectar thick liquid, puree, and soft solid. Delayed swallow and reduced laryngeal elevation suspected. Delayed, weak cough response to thin liquids, and extended oral prep of softened cracker was noted. Pt appeared to tolerate multiple presentations of nectar thick liquid and puree without overt s/s aspiration or  decline in respiratory status.   Will begin puree diet and nectar thick liquids, with recommendation for strict adherence to posted precautions. Pt need not be expected to meet nutritional needs po at this time, as DHT will be used for primary nutrition as pt strength and endurance improve. Once meds are being provided po, recommend crushed in puree given pt reported esophageal issues. ST will follow up for assessment of diet tolerance and education, and to determine if/when further workup via objective swallow study via MBS is needed. Pt, husband, and RN informed of results and recommendations.    SLP Visit Diagnosis: Dysphagia, unspecified (R13.10)    Aspiration Risk  Mild aspiration risk;Moderate aspiration risk    Diet Recommendation Dysphagia 1 (Puree);Nectar-thick liquid   Liquid Administration via: Cup;Straw Medication Administration: Crushed with puree Supervision: Staff to assist with self feeding;Full supervision/cueing for compensatory strategies Compensations: Minimize environmental distractions;Slow rate;Small sips/bites;Follow solids with liquid Postural Changes: Seated upright at 90 degrees;Remain upright for at least 30 minutes after po intake    Other  Recommendations Oral Care Recommendations: Oral care before and after PO Other Recommendations: Have oral suction available;Order thickener from pharmacy   Follow up Recommendations  (TBD)      Frequency and Duration min 2x/week  1 week;2 weeks       Prognosis Prognosis for Safe Diet Advancement: Good      Swallow Study   General Date of Onset: 01/14/17 HPI: 77 year old female admitted 01/14/17 after a fall outside at home. PMH noncontributory. Husband reported pt was considering esophageal dilation prior to admit. Pt intubated 10/10-17/18.  Type of Study: Bedside Swallow Evaluation Previous Swallow Assessment: none Diet Prior to this Study: NPO Temperature Spikes Noted:  (99) Respiratory Status: Nasal  cannula History of Recent Intubation: Yes Length of Intubations (days): 7 days Date extubated: 01/22/17 Behavior/Cognition: Alert;Cooperative;Pleasant mood Oral Cavity Assessment: Within Functional Limits Oral Care Completed by SLP: Yes Oral Cavity - Dentition: Adequate natural dentition Vision: Functional for self-feeding Self-Feeding Abilities: Total assist Patient Positioning: Upright in bed Baseline Vocal Quality: Normal;Low vocal intensity Volitional Cough: Weak Volitional Swallow: Able to elicit    Oral/Motor/Sensory Function Overall Oral Motor/Sensory Function: Mild impairment Facial ROM: Within Functional Limits Facial Symmetry: Within Functional Limits Facial Strength: Within Functional Limits Facial Sensation: Within Functional Limits Lingual ROM: Within Functional Limits Lingual Symmetry: Within Functional Limits Lingual Strength: Within Functional Limits Velum: Impaired left Mandible: Within Functional Limits   Ice Chips Ice chips: Within functional limits Presentation: Spoon   Thin Liquid Thin Liquid: Impaired Presentation: Cup;Straw Pharyngeal  Phase Impairments: Suspected delayed Swallow;Decreased hyoid-laryngeal movement;Cough - Delayed    Nectar Thick Nectar Thick Liquid: Impaired Presentation: Straw;Cup;Spoon Pharyngeal Phase Impairments: Suspected delayed Swallow;Decreased hyoid-laryngeal movement   Honey Thick Honey Thick Liquid: Not tested   Puree Puree: Impaired Presentation: Spoon Pharyngeal Phase Impairments: Suspected delayed Swallow;Decreased hyoid-laryngeal movement   Solid   GO   Solid: Impaired (softened graham cracker) Presentation: Spoon Oral Phase Functional Implications: Prolonged oral transit Pharyngeal Phase Impairments: Suspected delayed Swallow;Decreased hyoid-laryngeal movement    Functional Assessment Tool Used: asha noms, clinical judgment, BSE Functional Limitations: Swallowing Swallow Current Status ([id number]): At least 40  percent but less than 60 percent impaired, limited or restricted Swallow Goal Status [phone]): At least 20 percent but less than 40 percent impaired, limited or restricted  Dajanae Brophy B. Quentin Ore, [facility], [place] Speech Language Pathologist [phone]  Colon Flattery Brown 01/23/2017,10:56 AM

## 2017-01-23 NOTE — Progress Notes (Signed)
Patient ID: [facility] Sonya Park, female   DOB: 1940/09/03, 76 y.o.   MRN: [id number]   [facility] PROGRESS NOTE  ALAJA GOLDINGER WUJ:[id number] DOB: Apr 08, 1941 DOA: 01/14/2017 PCP: Cletis Athens, MD  HPI/Subjective: Patient extubated yesterday. Talking more today. Able to move her extremities. Complaints of some pain in the face, head.  Objective: Vitals:   01/23/17 1200 01/23/17 1300  BP: 133/83 130/87  Pulse:  (!) 103  Resp: 16 16  Temp: 99.9 F (37.7 C) 99.9 F (37.7 C)  SpO2:  98%    Filed Weights   01/14/17 1456 01/15/17 1530  Weight: 63 kg (139 lb) 71 kg (156 lb 8.4 oz)    ROS: Review of Systems  Constitutional: Negative for fever.  Respiratory: Negative for shortness of breath.   Cardiovascular: Negative for chest pain.  Gastrointestinal: Negative for abdominal pain, nausea and vomiting.  Musculoskeletal: Negative for joint pain.  Neurological: Positive for headaches.   Exam: Physical Exam  HENT:  Nose: No mucosal edema.  Mouth/Throat: No oropharyngeal exudate.  Eyes: Pupils are equal, round, and reactive to light. Lids are normal.  Icteric.   Neck: Carotid bruit is not present. No thyromegaly present.  Cardiovascular: Regular rhythm, S1 normal and S2 normal.  Tachycardia present.   Respiratory: She has decreased breath sounds in the right lower field and the left lower field. She has no wheezes. She has no rhonchi. She has no rales.  GI: Soft. Bowel sounds are normal. There is no tenderness.  Musculoskeletal:       Right elbow: She exhibits swelling.       Left elbow: She exhibits swelling.       Right wrist: She exhibits swelling.       Left wrist: She exhibits tenderness.       Right knee: She exhibits swelling.       Left knee: She exhibits swelling.       Right ankle: She exhibits swelling.       Left ankle: She exhibits swelling.  Lymphadenopathy:    She has no cervical adenopathy.  Neurological: She is alert.  Patient barely able to straight leg  raise. Patient able to lift arms up off the bed  Skin:  Large ulceration left posterior calf. Demarcation of the toes with a dusky look. Left third and fourth toe still brownish lookingBruising upper extremities and lower extremities.  Bruising around bilateral eyelids. Jaundice.  Psychiatric: She has a normal mood and affect.      Data Reviewed: Basic Metabolic Panel:  Recent Labs Lab 01/19/17 0512 01/19/17 1751 01/20/17 0503 01/20/17 0508 01/21/17 0516 01/22/17 0449 01/23/17 0321  NA 134* 134*  --  135 133* 137 138  K 4.2 4.5  --  4.6 4.5 3.9 4.0  CL 100* 102  --  101 105 107 106  CO2 23 25  --  _0 GLUCOSE 102* 100*  --  120* 95 117* 78  BUN 19 21*  --  23* 31* 43* 55*  CREATININE 0.81 0.89  --  0.78 0.93 0.80 0.95  CALCIUM 6.7* 6.8*  --  7.0* 7.2* 7.3* 7.7*  MG 1.9  --  1.8  --  1.8 1.7 2.1  PHOS 2.8  --  3.6  --  NOT CALCULATED 3.5 3.8   Liver Function Tests:  Recent Labs Lab 01/20/17 0508 01/21/17 0516 01/22/17 0449 01/23/17 0321  AST 147* 227* 240* 229*  ALT 41 51 65* 79*  ALKPHOS 108 166*  252* 336*  BILITOT 10.4* 12.2* 10.8* 9.3*  PROT 3.6* 3.8* 3.8* 4.7*  ALBUMIN 1.8* 1.7* 1.6* 1.9*    Recent Labs Lab 01/17/17 1225 01/18/17 0421  AMMONIA 52* 54*   CBC:  Recent Labs Lab 01/17/17 1225 01/17/17 1806 01/18/17 0421 01/20/17 0503 01/21/17 0516 01/22/17 0449 01/23/17 0321  WBC 14.1* 14.2* 14.7* 18.9* 22.6* 27.9* 33.9*  NEUTROABS 13.0* 13.0* 13.4*  --   --   --   --   HGB 10.6* 10.4* 9.9* 7.6* 8.2* 9.0* 10.4*  HCT 31.3* 30.4* 29.2* 23.0* 24.7* 27.3* 32.2*  MCV 107.3* 105.6* 105.1* 107.3* 107.8* 107.6* 109.3*  PLT 20* 16* 11* 14* 30* 48* 79*    CBG:  Recent Labs Lab 01/22/17 2329 01/23/17 0355 01/23/17 0714 01/23/17 0829 01/23/17 1129  GLUCAP 71 87 69 88 100*    Recent Results (from the past 240 hour(s))  Blood Culture (routine x 2)     Status: Abnormal   Collection Time: 01/14/17 11:08 PM  Result Value Ref Range Status    Specimen Description BLOOD LFOA  Final   Special Requests   Final    BOTTLES DRAWN AEROBIC AND ANAEROBIC Blood Culture results may not be optimal due to an excessive volume of blood received in culture bottles   Culture  Setup Time   Final    GRAM NEGATIVE RODS IN BOTH AEROBIC AND ANAEROBIC BOTTLES CRITICAL RESULT CALLED TO, READ BACK BY AND VERIFIED WITH:  MICHAEL SIMPSON AT 1207 01/15/17 SDR    Culture ESCHERICHIA COLI (A)  Final   Report Status 01/17/2017 FINAL  Final   Organism ID, Bacteria ESCHERICHIA COLI  Final      Susceptibility   Escherichia coli - MIC*    AMPICILLIN >=32 RESISTANT Resistant     CEFAZOLIN <=4 SENSITIVE Sensitive     CEFEPIME <=1 SENSITIVE Sensitive     CEFTAZIDIME <=1 SENSITIVE Sensitive     CEFTRIAXONE <=1 SENSITIVE Sensitive     CIPROFLOXACIN 1 SENSITIVE Sensitive     GENTAMICIN <=1 SENSITIVE Sensitive     IMIPENEM <=0.25 SENSITIVE Sensitive     TRIMETH/SULFA >=320 RESISTANT Resistant     AMPICILLIN/SULBACTAM >=32 RESISTANT Resistant     PIP/TAZO <=4 SENSITIVE Sensitive     Extended ESBL NEGATIVE Sensitive     * ESCHERICHIA COLI  Blood Culture (routine x 2)     Status: Abnormal   Collection Time: 01/14/17 11:08 PM  Result Value Ref Range Status   Specimen Description BLOOD LAC  Final   Special Requests   Final    BOTTLES DRAWN AEROBIC AND ANAEROBIC Blood Culture results may not be optimal due to an excessive volume of blood received in culture bottles   Culture  Setup Time   Final    GRAM NEGATIVE RODS IN BOTH AEROBIC AND ANAEROBIC BOTTLES CRITICAL VALUE NOTED.  VALUE IS CONSISTENT WITH PREVIOUSLY REPORTED AND CALLED VALUE.    Culture (A)  Final    ESCHERICHIA COLI SUSCEPTIBILITIES PERFORMED ON PREVIOUS CULTURE WITHIN THE LAST 5 DAYS. Performed at [facility], [place] [address] [place], [place] [address]    Report Status 01/17/2017 FINAL  Final  Urine culture     Status: Abnormal   Collection Time: 01/14/17 11:08 PM  Result Value Ref  Range Status   Specimen Description URINE, RANDOM  Final   Special Requests NONE  Final   Culture (A)  Final    <10,000 COLONIES/mL INSIGNIFICANT GROWTH Performed at [facility], [place]  [address], [place], [place] [address]    Report Status 01/16/2017 FINAL  Final  Blood Culture ID Panel (Reflexed)     Status: Abnormal   Collection Time: 01/14/17 11:08 PM  Result Value Ref Range Status   Enterococcus species NOT DETECTED NOT DETECTED Final   Listeria monocytogenes NOT DETECTED NOT DETECTED Final   Staphylococcus species NOT DETECTED NOT DETECTED Final   Staphylococcus aureus NOT DETECTED NOT DETECTED Final   Streptococcus species NOT DETECTED NOT DETECTED Final   Streptococcus agalactiae NOT DETECTED NOT DETECTED Final   Streptococcus pneumoniae NOT DETECTED NOT DETECTED Final   Streptococcus pyogenes NOT DETECTED NOT DETECTED Final   Acinetobacter baumannii NOT DETECTED NOT DETECTED Final   Enterobacteriaceae species DETECTED (A) NOT DETECTED Final    Comment: Enterobacteriaceae represent a large family of gram-negative bacteria, not a single organism. CRITICAL RESULT CALLED TO, READ BACK BY AND VERIFIED WITH:  MICHAEL SIMPSON AT 1207 01/15/17 SDR    Enterobacter cloacae complex NOT DETECTED NOT DETECTED Final   Escherichia coli DETECTED (A) NOT DETECTED Final    Comment: CRITICAL RESULT CALLED TO, READ BACK BY AND VERIFIED WITH:  MICHAEL SIMPSON AT 1207 01/15/17 SDR    Klebsiella oxytoca NOT DETECTED NOT DETECTED Final   Klebsiella pneumoniae NOT DETECTED NOT DETECTED Final   Proteus species NOT DETECTED NOT DETECTED Final   Serratia marcescens NOT DETECTED NOT DETECTED Final   Carbapenem resistance NOT DETECTED NOT DETECTED Final   Haemophilus influenzae NOT DETECTED NOT DETECTED Final   Neisseria meningitidis NOT DETECTED NOT DETECTED Final   Pseudomonas aeruginosa NOT DETECTED NOT DETECTED Final   Candida albicans NOT DETECTED NOT DETECTED Final   Candida glabrata NOT  DETECTED NOT DETECTED Final   Candida krusei NOT DETECTED NOT DETECTED Final   Candida parapsilosis NOT DETECTED NOT DETECTED Final   Candida tropicalis NOT DETECTED NOT DETECTED Final  MRSA PCR Screening     Status: None   Collection Time: 01/20/17 11:04 AM  Result Value Ref Range Status   MRSA by PCR NEGATIVE NEGATIVE Final    Comment:        The GeneXpert MRSA Assay (FDA approved for NASAL specimens only), is one component of a comprehensive MRSA colonization surveillance program. It is not intended to diagnose MRSA infection nor to guide or monitor treatment for MRSA infections.      Studies: Dg Abd 1 View  Result Date: 01/22/2017 CLINICAL DATA:  Feeding tube placement. EXAM: ABDOMEN - 1 VIEW COMPARISON:  Supine abdominal radiograph of January 17, 2017 FINDINGS: A he radiodense tipped feeding tube is present. The tip of the tube appears to lie in the proximal duodenum. IMPRESSION: The feeding tube tip overlies the proximal duodenum. Electronically Signed   By: David  Martinique M.D.   On: 01/22/2017 12:00   Dg Chest Port 1 View  Result Date: 01/23/2017 CLINICAL DATA:  Respiratory failure. EXAM: PORTABLE CHEST 1 VIEW COMPARISON:  01/22/2017. FINDINGS: Interim removal of endotracheal tube and NG tube. Right IJ line in stable position. Interim slight clearing of bibasilar atelectasis and improvement of left pleural effusion. No pneumothorax. IMPRESSION: 1. An removal of endotracheal tube and NG tube. Right IJ line stable position. 2. Interim slight clearing of bibasilar atelectasis and left pleural effusion. Electronically Signed   By: Marcello Moores  Register   On: 01/23/2017 06:35   Dg Chest Port 1 View  Result Date: 01/22/2017 CLINICAL DATA:  Hyperbilirubinemia. EXAM: PORTABLE CHEST 1 VIEW COMPARISON:  Radiograph January 20, 2017. FINDINGS: Stable cardiomediastinal  silhouette. Atherosclerosis of thoracic aorta is noted. Endotracheal and nasogastric tubes are unchanged in position. Right  internal jugular catheter is unchanged with distal tip in expected position of the SVC. No pneumothorax is noted. Stable bilateral pleural effusions are noted, left greater than right, with probable associated atelectasis. Bony thorax is unremarkable. IMPRESSION: Aortic atherosclerosis. Stable support apparatus. Stable bilateral pleural effusions are noted with probable associated atelectasis. Electronically Signed   By: Marijo Conception, M.D.   On: 01/22/2017 07:42   US Abdomen Limited Ruq  Result Date: 01/21/2017 CLINICAL DATA:  Elevated bilirubin EXAM: ULTRASOUND ABDOMEN LIMITED RIGHT UPPER QUADRANT COMPARISON:  CT abdomen pelvis of 01/14/2017 FINDINGS: Gallbladder: There is a single gallstone within the neck of the gallbladder of 1.4 cm. There is no pain over the gallbladder, but there is some thickening of the gallbladder wall. Developing acute cholecystitis cannot be excluded. Common bile duct: Diameter: The common bile duct measures 5.8 mm in diameter. Liver: The parenchyma of the liver is echogenic and inhomogeneous consistent with diffuse fatty infiltration. No focal hepatic abnormality is seen. Portal vein is patent on color Doppler imaging with normal direction of blood flow towards the liver. Small amount of ascites is noted adjacent to the liver as well as a small amount within all 4 quadrants. IMPRESSION: 1. Single 1.4 cm gallstone with focally thickened gallbladder wall. Cannot exclude developing acute cholecystitis. Correlate clinically. 2. Echogenic liver parenchyma consistent with fatty infiltration. 3. Some ascites is noted as described above. Electronically Signed   By: Ivar Drape M.D.   On: 01/21/2017 16:52    Scheduled Meds: . chlorhexidine gluconate (MEDLINE KIT)  15 mL Mouth Rinse BID  . feeding supplement (PRO-STAT SUGAR FREE 64)  30 mL Per Tube BID  . folic acid  1 mg Per Tube Daily  . lactulose  20 g Per Tube BID  . mouth rinse  15 mL Mouth Rinse 10 times per day  .  multivitamin  15 mL Per Tube Daily  . pantoprazole sodium  40 mg Per Tube Daily  . phytonadione  10 mg Per Tube Daily  . rifaximin  550 mg Per Tube BID  . sodium chloride flush  10-40 mL Intracatheter Q12H  . thiamine  100 mg Per Tube Daily  . vitamin A  20,000 Units Oral Daily  . vitamin B-12  1,000 mcg Per Tube Daily  . zinc sulfate  220 mg Per Tube Daily   Continuous Infusions: . ciprofloxacin Stopped (01/23/17 1130)  . feeding supplement (OSMOLITE 1.5 CAL) 1,000 mL (01/23/17 1245)    Assessment/Plan:  1. Septic shock with Escherichia coli.  Patient on IV Cipro.  Patient off pressors and steroids at this point 2. Acute respiratory failure with hypoxia. Patient extubated 01/22/2017. On nasal cannula 3. Alcohol abuse.  4. Acute encephalopathy secondary to severe sepsis.  Patient started on lactulose and Xifaxan just in case this was secondary to hepatic encephalopthy. Mental status much improved. 5. Pancytopenia could be secondary to alcohol abuse and/or sepsis. Platelet count continuing to recover to 79,000. 6. Hypomagnesemia, hypocalcemia and hypokalemia. Electrolytes have been replaced. 7. Severe malnutrition, Anasarca and third spacing of fluids.  Tube feeding resumed. Patient also placed on diet. 8. Jaundice likely secondary to shock. Could also be secondary to prior alcohol abuse 9. Facial trauma at home. Fractured nasal bones. Hematoma and bruising around the eyelids. 10. Anasarca given a dose of Lasix today 11. Physical therapy evaluation  Code Status:     Code Status Orders  Start     Ordered   01/15/17 0158  Full code  Continuous     01/15/17 0157    Code Status History    Date Active Date Inactive Code Status Order ID Comments User Context   12/13/2015 11:49 AM 12/15/2015  7:28 PM Full Code [id number]  Dereck Leep, MD Inpatient   09/17/2014  6:01 AM 09/18/2014  3:51 PM Full Code [id number]  Juluis Mire, MD Inpatient     Family Communication: spoke  with husband at the bedside Disposition Plan: potential candidate for LTAC  Consultants:  Critical care specialist  Antibiotics:  Cipro  Time spent: 20 minutes  Loletha Grayer  [employer]

## 2017-01-23 NOTE — Progress Notes (Signed)
Pt had increase in HR in 110's through out the night, she complained of headaches, gave tylenol, change dressing and gave pt bath. Pt still weeping all over.

## 2017-01-24 DIAGNOSIS — R5381 Other malaise: Secondary | ICD-10-CM

## 2017-01-24 DIAGNOSIS — G9340 Encephalopathy, unspecified: Secondary | ICD-10-CM

## 2017-01-24 LAB — CBC
HCT: 33.1 % — ABNORMAL LOW (ref 35.0–47.0)
HEMOGLOBIN: 11.2 g/dL — AB (ref 12.0–16.0)
MCH: 37 pg — AB (ref 26.0–34.0)
MCHC: 33.8 g/dL (ref 32.0–36.0)
MCV: 109.6 fL — AB (ref 80.0–100.0)
Platelets: 106 10*3/uL — ABNORMAL LOW (ref 150–440)
RBC: 3.02 MIL/uL — AB (ref 3.80–5.20)
RDW: 18.2 % — ABNORMAL HIGH (ref 11.5–14.5)
WBC: 21.4 10*3/uL — ABNORMAL HIGH (ref 3.6–11.0)

## 2017-01-24 LAB — GLUCOSE, CAPILLARY
GLUCOSE-CAPILLARY: 132 mg/dL — AB (ref 65–99)
Glucose-Capillary: 121 mg/dL — ABNORMAL HIGH (ref 65–99)
Glucose-Capillary: 107 mg/dL — ABNORMAL HIGH (ref 65–99)

## 2017-01-24 LAB — COMPREHENSIVE METABOLIC PANEL
ALK PHOS: 291 U/L — AB (ref 38–126)
ALT: 84 U/L — AB (ref 14–54)
AST: 206 U/L — ABNORMAL HIGH (ref 15–41)
Albumin: 2 g/dL — ABNORMAL LOW (ref 3.5–5.0)
Anion gap: 8 (ref 5–15)
BUN: 56 mg/dL — ABNORMAL HIGH (ref 6–20)
CALCIUM: 7.6 mg/dL — AB (ref 8.9–10.3)
CO2: 26 mmol/L (ref 22–32)
CREATININE: 0.92 mg/dL (ref 0.44–1.00)
Chloride: 113 mmol/L — ABNORMAL HIGH (ref 101–111)
GFR calc non Af Amer: 59 mL/min — ABNORMAL LOW (ref >60)
Glucose, Bld: 149 mg/dL — ABNORMAL HIGH (ref 65–99)
Potassium: 3.3 mmol/L — ABNORMAL LOW (ref 3.5–5.1)
SODIUM: 147 mmol/L — AB (ref 135–145)
Total Bilirubin: 7.5 mg/dL — ABNORMAL HIGH (ref 0.3–1.2)
Total Protein: 4.8 g/dL — ABNORMAL LOW (ref 6.5–8.1)

## 2017-01-24 LAB — PROCALCITONIN: Procalcitonin: 0.51 ng/mL

## 2017-01-24 MED ORDER — FREE WATER: 200.0000 mL | Freq: Three times a day (TID) | Status: DC

## 2017-01-24 MED ORDER — HYDRALAZINE HCL 20 MG/ML IJ SOLN
10.0000 mg | Freq: Once | INTRAMUSCULAR | Status: AC
  Administered 2017-01-24: 10 mg via INTRAVENOUS
  Filled 2017-01-24: qty 1

## 2017-01-24 MED ORDER — FREE WATER
200.0000 mL | Status: DC
  Administered 2017-01-24 – 2017-01-31 (×41): 200 mL

## 2017-01-24 MED ORDER — FUROSEMIDE 10 MG/ML IJ SOLN
40.0000 mg | Freq: Once | INTRAMUSCULAR | Status: AC
  Administered 2017-01-24: 40 mg via INTRAVENOUS
  Filled 2017-01-24: qty 4

## 2017-01-24 MED ORDER — POTASSIUM CHLORIDE 20 MEQ/15ML (10%) PO SOLN
40.0000 meq | Freq: Three times a day (TID) | ORAL | Status: AC
  Administered 2017-01-24 (×3): 40 meq
  Filled 2017-01-24 (×3): qty 30

## 2017-01-24 MED ORDER — ENOXAPARIN SODIUM 40 MG/0.4ML ~~LOC~~ SOLN
40.0000 mg | SUBCUTANEOUS | Status: DC
Start: 2017-01-24 — End: 2017-02-07
  Administered 2017-01-24 – 2017-02-06 (×14): 40 mg via SUBCUTANEOUS
  Filled 2017-01-24 (×14): qty 0.4

## 2017-01-24 NOTE — Progress Notes (Signed)
  Speech Language Pathology Treatment: Dysphagia  Patient Details Name: Sonya Park MRN: [id number] DOB: 1940-08-25 Today's Date: 01/24/2017 Time: 1320-1400 SLP Time Calculation (min) (ACUTE ONLY): 40 min  Assessment / Plan / Recommendation Clinical Impression  Pt seen for ongoing assessment of toleration of modified diet; she continues to have an NG placed for nutritional support. Husband present in room. Per NSG report, pt presents w/ increased effort when attempting swallowing to clear the puree consistency foods; NSG stated she has to give a tsp of nectar liquid added to the food bolus to help pt clear. Pt continues to have NG in place; pt also has a baseline of Esophageal DYSMOTILITY per Husband's report - also baseline of Gastric Bypass surgery.  Due to concerns of denser puree foods increasing Esophageal dysmotility in pt, a more full liquid diet of Nectar consistency is recommended. Pt only accepted a tsp of such but was able to clear w/out overt s/s of aspiration noted. Less immediate effort noted during swallows. NSG in room to address changing pt so further trials were held. Education was had w/ Husband re: diet consistency recommended d/t the food consistencies and density; also encouraged food preparation and use of condiments.  Diet consistency modified somewhat more; recommend general aspiration and REFLUX precautions; pills given crushed via NG as able; feeding support at meals and rest breaks monitoring for fatigue and Esophageal clearing/"fullness". ST services will f/u w/ MBSS to objectively assess swallow function when appropriate, if indicated. NG tube appears necessary at this time to aid pt in meeting her nutritional needs d/t lengthy illness and Esophageal dysmotility/GI issues baseline. NSG updated.     HPI HPI: 76 year old female admitted 01/14/17 after a fall outside at home. PMH includes Gastric Bypass surgery; reduced appetite and oral intake prior to fall/hospitalization  being followed by MD for appetite stimulation; also impacted by constipation per husband. Husband reported pt was considering f/u w/ GI for esophageal dilation prior to admit d/t c/o esophageal Dysmotility. Pt intubated 10/10-17/18.      SLP Plan  Continue with current plan of care       Recommendations  Diet recommendations: Nectar-thick liquid (some purees - Full Liquid diet w/ Nectar consistency) Liquids provided via: Teaspoon;Cup Medication Administration: Via alternative means (use NG(already in place)) Supervision: Staff to assist with self feeding;Full supervision/cueing for compensatory strategies Compensations: Minimize environmental distractions;Slow rate;Small sips/bites;Lingual sweep for clearance of pocketing;Multiple dry swallows after each bite/sip;Follow solids with liquid Postural Changes and/or Swallow Maneuvers: Seated upright 90 degrees;Upright 30-60 min after meal                General recommendations:  (Dietician following) Oral Care Recommendations: Oral care BID;Staff/trained caregiver to provide oral care Follow up Recommendations: Skilled Nursing facility SLP Visit Diagnosis: Dysphagia, pharyngoesophageal phase (R13.14);Dysphagia, oropharyngeal phase (R13.12) Plan: Continue with current plan of care       [place], [place], CCC-SLP [employer] 01/24/2017, 2:23 PM

## 2017-01-24 NOTE — Progress Notes (Signed)
Physical Therapy Treatment Patient Details Name: Sonya Park MRN: [id number] DOB: 07-24-1940 Today's Date: 01/24/2017    History of Present Illness 76 y/o female who suffered a fall with considerable facial fractures and ended up being intubated 10/10-10/17 in CCU.  h/o ETOH abuse, e. coli (+)    PT Comments    Pt continues to be lethargic with somewhat flat effect, but she was able to participate with b/l U&LE exercises today and did show good effort.  Pt is still profoundly weak and does not have a lot of AROM but with light AAROM is able to increase reps and individual exercises today.  We did not try mobility, sitting today as pt is still so weak, having headache and was dependent with all aspects of sitting yesterday. Pt's O2 (on supplemental O2) was in the high 90s the entire time, as was her HR with only infrequent forays into the 100s bpm.  Follow Up Recommendations  SNF     Equipment Recommendations  Rolling walker with 5" wheels    Recommendations for Other Services       Precautions / Restrictions Precautions Precautions: Fall Restrictions Weight Bearing Restrictions: No    Mobility  Bed Mobility               General bed mobility comments: deferred bed mobility today, pt still extremely weak  Transfers                    Ambulation/Gait                 Stairs            Wheelchair Mobility    Modified Rankin (Stroke Patients Only)       Balance                                            Cognition Arousal/Alertness: Lethargic Behavior During Therapy: Flat affect Overall Cognitive Status: Impaired/Different from baseline                                        Exercises General Exercises - Upper Extremity Shoulder Flexion: AAROM;5 reps (reaching to PT's hand) Elbow Flexion: AROM;10 reps;Strengthening Elbow Extension: AROM;10 reps;Strengthening General Exercises - Lower  Extremity Ankle Circles/Pumps: AROM;10 reps Quad Sets: Strengthening;10 reps Short Arc Quad: AROM;10 reps;AAROM Heel Slides: AAROM;10 reps Hip ABduction/ADduction: AAROM;10 reps    General Comments        Pertinent Vitals/Pain Pain Assessment:  (reports moderate headache)    Home Living                      Prior Function            PT Goals (current goals can now be found in the care plan section) Progress towards PT goals: Progressing toward goals    Frequency    Min 2X/week      PT Plan Current plan remains appropriate    Co-evaluation              AM-PAC PT "6 Clicks" Daily Activity  Outcome Measure  Difficulty turning over in bed (including adjusting bedclothes, sheets and blankets)?: Unable Difficulty moving from lying on back to sitting on the side of the bed? : Unable  Difficulty sitting down on and standing up from a chair with arms (e.g., wheelchair, bedside commode, etc,.)?: Unable Help needed moving to and from a bed to chair (including a wheelchair)?: Total Help needed walking in hospital room?: Total Help needed climbing 3-5 steps with a railing? : Total 6 Click Score: 6    End of Session Equipment Utilized During Treatment: Oxygen Activity Tolerance: Patient limited by lethargy;Patient limited by fatigue Patient left: with family/visitor present;in bed;with call bell/phone within reach Nurse Communication: Mobility status PT Visit Diagnosis: Muscle weakness (generalized) (M62.81);Difficulty in walking, not elsewhere classified (R26.2)     Time: 0388-8280 PT Time Calculation (min) (ACUTE ONLY): 24 min  Charges:  $Therapeutic Exercise: 23-37 mins                    G Codes:       Kreg Shropshire, DPT 01/24/2017, 4:05 PM

## 2017-01-24 NOTE — Progress Notes (Signed)
Patient ID: JOWANNA LOEFFLER, female   DOB: October 15, 1940, 76 y.o.   MRN: [id number]   [facility] PROGRESS NOTE  ENRICA CORLISS EXN:[id number] DOB: 31-Oct-1940 DOA: 01/14/2017 PCP: Cletis Athens, MD  HPI/Subjective: Patient complains of some pain in the face. Not eating very much. Still receiving tube feeding.  Objective: Vitals:   01/24/17 1400 01/24/17 1500  BP: (!) 146/95 (!) 160/91  Pulse: 99 97  Resp: (!) 23 20  Temp:    SpO2: 99% 99%    Filed Weights   01/14/17 1456 01/15/17 1530  Weight: 63 kg (139 lb) 71 kg (156 lb 8.4 oz)    ROS: Review of Systems  Constitutional: Negative for fever.  Respiratory: Negative for shortness of breath.   Cardiovascular: Negative for chest pain.  Gastrointestinal: Negative for abdominal pain, nausea and vomiting.  Musculoskeletal: Negative for joint pain.  Neurological: Positive for headaches.   Exam: Physical Exam  HENT:  Nose: No mucosal edema.  Mouth/Throat: No oropharyngeal exudate.  Eyes: Pupils are equal, round, and reactive to light. Lids are normal.  Icteric.   Neck: Carotid bruit is not present. No thyromegaly present.  Cardiovascular: Regular rhythm, S1 normal and S2 normal.  Tachycardia present.   Respiratory: She has decreased breath sounds in the right lower field and the left lower field. She has no wheezes. She has no rhonchi. She has no rales.  GI: Soft. Bowel sounds are normal. There is no tenderness.  Musculoskeletal:       Right elbow: She exhibits swelling.       Left elbow: She exhibits swelling.       Right wrist: She exhibits swelling.       Left wrist: She exhibits tenderness.       Right knee: She exhibits swelling.       Left knee: She exhibits swelling.       Right ankle: She exhibits swelling.       Left ankle: She exhibits swelling.  Lymphadenopathy:    She has no cervical adenopathy.  Neurological: She is alert.  Patient barely able to straight leg raise. Patient able to lift arms up off the bed   Skin:  Large ulceration left posterior calf. Demarcation of the toes with a dusky look. Left third and fourth toe still brownish lookingBruising upper extremities and lower extremities.  Bruising around bilateral eyelids. Jaundice.  Psychiatric: She has a normal mood and affect.      Data Reviewed: Basic Metabolic Panel:  Recent Labs Lab 01/19/17 0512  01/20/17 0503 01/20/17 4967 01/21/17 0516 01/22/17 0449 01/23/17 0321 01/24/17 0321  NA 134*  < >  --  135 133* 137 138 147*  K 4.2  < >  --  4.6 4.5 3.9 4.0 3.3*  CL 100*  < >  --  101 105 107 106 113*  CO2 23  < >  --  '25 25 26 23 26  '$ GLUCOSE 102*  < >  --  120* 95 117* 78 149*  BUN 19  < >  --  23* 31* 43* 55* 56*  CREATININE 0.81  < >  --  0.78 0.93 0.80 0.95 0.92  CALCIUM 6.7*  < >  --  7.0* 7.2* 7.3* 7.7* 7.6*  MG 1.9  --  1.8  --  1.8 1.7 2.1  --   PHOS 2.8  --  3.6  --  NOT CALCULATED 3.5 3.8  --   < > = values in this interval not  displayed. Liver Function Tests:  Recent Labs Lab 01/20/17 0508 01/21/17 0516 01/22/17 0449 01/23/17 0321 01/24/17 0321  AST 147* 227* 240* 229* 206*  ALT 41 51 65* 79* 84*  ALKPHOS 108 166* 252* 336* 291*  BILITOT 10.4* 12.2* 10.8* 9.3* 7.5*  PROT 3.6* 3.8* 3.8* 4.7* 4.8*  ALBUMIN 1.8* 1.7* 1.6* 1.9* 2.0*    Recent Labs Lab 01/18/17 0421  AMMONIA 54*   CBC:  Recent Labs Lab 01/17/17 1806 01/18/17 0421 01/20/17 0503 01/21/17 0516 01/22/17 0449 01/23/17 0321 01/24/17 0321  WBC 14.2* 14.7* 18.9* 22.6* 27.9* 33.9* 21.4*  NEUTROABS 13.0* 13.4*  --   --   --   --   --   HGB 10.4* 9.9* 7.6* 8.2* 9.0* 10.4* 11.2*  HCT 30.4* 29.2* 23.0* 24.7* 27.3* 32.2* 33.1*  MCV 105.6* 105.1* 107.3* 107.8* 107.6* 109.3* 109.6*  PLT 16* 11* 14* 30* 48* 79* 106*    CBG:  Recent Labs Lab 01/23/17 1129 01/23/17 1645 01/23/17 2011 01/24/17 0402 01/24/17 0741  GLUCAP 100* 139* 147* 132* 121*    Recent Results (from the past 240 hour(s))  Blood Culture (routine x 2)      Status: Abnormal   Collection Time: 01/14/17 11:08 PM  Result Value Ref Range Status   Specimen Description BLOOD LFOA  Final   Special Requests   Final    BOTTLES DRAWN AEROBIC AND ANAEROBIC Blood Culture results may not be optimal due to an excessive volume of blood received in culture bottles   Culture  Setup Time   Final    GRAM NEGATIVE RODS IN BOTH AEROBIC AND ANAEROBIC BOTTLES CRITICAL RESULT CALLED TO, READ BACK BY AND VERIFIED WITH:  MICHAEL SIMPSON AT 1207 01/15/17 SDR    Culture ESCHERICHIA COLI (A)  Final   Report Status 01/17/2017 FINAL  Final   Organism ID, Bacteria ESCHERICHIA COLI  Final      Susceptibility   Escherichia coli - MIC*    AMPICILLIN >=32 RESISTANT Resistant     CEFAZOLIN <=4 SENSITIVE Sensitive     CEFEPIME <=1 SENSITIVE Sensitive     CEFTAZIDIME <=1 SENSITIVE Sensitive     CEFTRIAXONE <=1 SENSITIVE Sensitive     CIPROFLOXACIN 1 SENSITIVE Sensitive     GENTAMICIN <=1 SENSITIVE Sensitive     IMIPENEM <=0.25 SENSITIVE Sensitive     TRIMETH/SULFA >=320 RESISTANT Resistant     AMPICILLIN/SULBACTAM >=32 RESISTANT Resistant     PIP/TAZO <=4 SENSITIVE Sensitive     Extended ESBL NEGATIVE Sensitive     * ESCHERICHIA COLI  Blood Culture (routine x 2)     Status: Abnormal   Collection Time: 01/14/17 11:08 PM  Result Value Ref Range Status   Specimen Description BLOOD LAC  Final   Special Requests   Final    BOTTLES DRAWN AEROBIC AND ANAEROBIC Blood Culture results may not be optimal due to an excessive volume of blood received in culture bottles   Culture  Setup Time   Final    GRAM NEGATIVE RODS IN BOTH AEROBIC AND ANAEROBIC BOTTLES CRITICAL VALUE NOTED.  VALUE IS CONSISTENT WITH PREVIOUSLY REPORTED AND CALLED VALUE.    Culture (A)  Final    ESCHERICHIA COLI SUSCEPTIBILITIES PERFORMED ON PREVIOUS CULTURE WITHIN THE LAST 5 DAYS. Performed at [facility], [place] [address], [place], [place] [address]    Report Status 01/17/2017 FINAL  Final  Urine  culture     Status: Abnormal   Collection Time: 01/14/17 11:08 PM  Result Value  Ref Range Status   Specimen Description URINE, RANDOM  Final   Special Requests NONE  Final   Culture (A)  Final    <10,000 COLONIES/mL INSIGNIFICANT GROWTH Performed at [facility], [place] [address] [place], [place] [address]    Report Status 01/16/2017 FINAL  Final  Blood Culture ID Panel (Reflexed)     Status: Abnormal   Collection Time: 01/14/17 11:08 PM  Result Value Ref Range Status   Enterococcus species NOT DETECTED NOT DETECTED Final   Listeria monocytogenes NOT DETECTED NOT DETECTED Final   Staphylococcus species NOT DETECTED NOT DETECTED Final   Staphylococcus aureus NOT DETECTED NOT DETECTED Final   Streptococcus species NOT DETECTED NOT DETECTED Final   Streptococcus agalactiae NOT DETECTED NOT DETECTED Final   Streptococcus pneumoniae NOT DETECTED NOT DETECTED Final   Streptococcus pyogenes NOT DETECTED NOT DETECTED Final   Acinetobacter baumannii NOT DETECTED NOT DETECTED Final   Enterobacteriaceae species DETECTED (A) NOT DETECTED Final    Comment: Enterobacteriaceae represent a large family of gram-negative bacteria, not a single organism. CRITICAL RESULT CALLED TO, READ BACK BY AND VERIFIED WITH:  MICHAEL SIMPSON AT 1207 01/15/17 SDR    Enterobacter cloacae complex NOT DETECTED NOT DETECTED Final   Escherichia coli DETECTED (A) NOT DETECTED Final    Comment: CRITICAL RESULT CALLED TO, READ BACK BY AND VERIFIED WITH:  MICHAEL SIMPSON AT 1207 01/15/17 SDR    Klebsiella oxytoca NOT DETECTED NOT DETECTED Final   Klebsiella pneumoniae NOT DETECTED NOT DETECTED Final   Proteus species NOT DETECTED NOT DETECTED Final   Serratia marcescens NOT DETECTED NOT DETECTED Final   Carbapenem resistance NOT DETECTED NOT DETECTED Final   Haemophilus influenzae NOT DETECTED NOT DETECTED Final   Neisseria meningitidis NOT DETECTED NOT DETECTED Final   Pseudomonas aeruginosa NOT DETECTED NOT  DETECTED Final   Candida albicans NOT DETECTED NOT DETECTED Final   Candida glabrata NOT DETECTED NOT DETECTED Final   Candida krusei NOT DETECTED NOT DETECTED Final   Candida parapsilosis NOT DETECTED NOT DETECTED Final   Candida tropicalis NOT DETECTED NOT DETECTED Final  MRSA PCR Screening     Status: None   Collection Time: 01/20/17 11:04 AM  Result Value Ref Range Status   MRSA by PCR NEGATIVE NEGATIVE Final    Comment:        The GeneXpert MRSA Assay (FDA approved for NASAL specimens only), is one component of a comprehensive MRSA colonization surveillance program. It is not intended to diagnose MRSA infection nor to guide or monitor treatment for MRSA infections.      Studies: Dg Chest Port 1 View  Result Date: 01/23/2017 CLINICAL DATA:  Respiratory failure. EXAM: PORTABLE CHEST 1 VIEW COMPARISON:  01/22/2017. FINDINGS: Interim removal of endotracheal tube and NG tube. Right IJ line in stable position. Interim slight clearing of bibasilar atelectasis and improvement of left pleural effusion. No pneumothorax. IMPRESSION: 1. An removal of endotracheal tube and NG tube. Right IJ line stable position. 2. Interim slight clearing of bibasilar atelectasis and left pleural effusion. Electronically Signed   By: Marcello Moores  Register   On: 01/23/2017 06:35    Scheduled Meds: . chlorhexidine gluconate (MEDLINE KIT)  15 mL Mouth Rinse BID  . Copper Gluconate  1 tablet Oral Daily  . enoxaparin (LOVENOX) injection  40 mg Subcutaneous Q24H  . feeding supplement (PRO-STAT SUGAR FREE 64)  30 mL Per Tube BID  . folic acid  1 mg Per Tube Daily  . free water  200 mL Per Tube Q4H  . multivitamin  15 mL Per Tube Daily  . phytonadione  10 mg Per Tube Daily  . potassium chloride  40 mEq Per Tube TID  . rifaximin  550 mg Per Tube BID  . sodium chloride flush  10-40 mL Intracatheter Q12H  . thiamine  100 mg Per Tube Daily  . vitamin A  20,000 Units Oral Daily  . vitamin B-12  1,000 mcg Per Tube  Daily  . zinc sulfate  220 mg Per Tube Daily   Continuous Infusions: . feeding supplement (OSMOLITE 1.5 CAL) 1,000 mL (01/23/17 1245)    Assessment/Plan:  1. Septic shock with Escherichia coli.  Patient on IV Cipro.  Patient off pressors and steroids at this point. 2. Acute respiratory failure with hypoxia. Patient extubated 01/22/2017. On nasal cannula. 3. Alcohol abuse.  4. Acute encephalopathy secondary to severe sepsis.  Patient started on lactulose and Xifaxan just in case this was secondary to hepatic encephalopthy. Mental status much improved.  Lactulose stopped because of diarrhea and rectal tube 5. Pancytopenia could be secondary to alcohol abuse and/or sepsis. Platelet count recovered 6. Hypomagnesemia, hypocalcemia and hypokalemia. Potassium replacement 7. Severe malnutrition, Anasarca and third spacing of fluids.  Tube feeding resumed. Patient also placed on diet. 8. Jaundice likely secondary to shock. Could also be secondary to prior alcohol abuse. Total bilirubin trending better 9. Facial trauma at home. Fractured nasal bones. Hematoma and bruising around the eyelids. 10. Anasarca given a dose of Lasix yesterday 11. Physical therapy evaluation 12. Potential candidate for LTAC  Code Status:     Code Status Orders        Start     Ordered   01/15/17 0158  Full code  Continuous     01/15/17 0157    Code Status History    Date Active Date Inactive Code Status Order ID Comments User Context   12/13/2015 11:49 AM 12/15/2015  7:28 PM Full Code [id number]  Dereck Leep, MD Inpatient   09/17/2014  6:01 AM 09/18/2014  3:51 PM Full Code [id number]  Juluis Mire, MD Inpatient     Family Communication: spoke with husband at the bedside Disposition Plan: potential candidate for LTAC  Consultants:  Critical care specialist  Antibiotics:  Cipro  Time spent: 20 minutes  Loletha Grayer  [employer]

## 2017-01-24 NOTE — Progress Notes (Signed)
PULMONARY / CRITICAL CARE MEDICINE   Name: Sonya Park MRN: [id number] DOB: 1941-02-22    ADMISSION DATE:  01/14/2017  PT PROFILE: 76 y.o. F with h/o EtOH abuse admitted 01/14/17 with fall, facial contusions, severe sepsis. BCs positive for E coli. Intubated 10/10 for decreased LOC and worsening shock.   MAJOR EVENTS/TEST RESULTS: 10/09 Admitted via ED 10/09 CT head: Periorbital and frontal scalp soft tissue thickening, without acute intracranial abnormality. New hyperattenuating material in the left frontal sinus could represent hemorrhage. No acute fracture is seen  10/09 CT maxillofacial: Bilateral nasal bone fractures without significant displacement. Left frontal hemorrhage suggest an occult fracture. No discrete fracture is visualized 10/09 CTAP: Marked diffuse hepatic steatosis. No acute findings 10/09 CT chest: No acute findings 10/10 Worsening shock. Worsening level of consciousness. Intubated 10/13 echocardiogram: LVEF 45-50%. Mild concentric hypertrophy. Mild MR. Moderate TR. RA mildly dilated 10/16 off vasopressors. Worsening hyperbilirubinemia. Tolerates PSV mode. Cognition still poor 10/16 RUQ [place]: Single 1.4 cm gallstone with focally thickened gallbladder wall. Cannot exclude developing acute cholecystitis. Correlate clinically. Echogenic liver parenchyma consistent with fatty infiltration 10/17 Passed SBT. Extubated. Rifaximin added 10/18 Much more responsive. Somewhat conversant. No distress. Changed to SDU status. PT eval requested 10/18 SLP eval: recommend pured diet with nectar thick liquids. Tube feedings initiated in anticipation that she will not be able to meet her nutritional needs by mouth 10/19 Cognition continues to improve. PT eval. Transfer to Med-surg floor  INDWELLING DEVICES:: R femoral A-line 10/10 >> 10/16 ETT 10/10 >> 10/17 R IJ CVL 10/10 >> 10/19   MICRO DATA: MRSA PCR 10/15 >> NEG Urine 10/09 >> insignificant growth Blood 10/09 >> 2/2  Escherichia coli  ANTIMICROBIALS:  Vanc 10/09 >> 10/10 Aztreonam 10/09 >> 10/09 Levofloxacin 10/09 >> 10/10 Meropenem 10/09 >> 10/12 Cipro 10/12 >> 10/19    SUBJECTIVE:  RASS 0, no distress. Conversant. Extremely weak  VITAL SIGNS: BP (!) 145/88   Pulse (!) 107   Temp 100 F (37.8 C)   Resp 20   Ht 5\' 6"  (1.676 m)   Wt 71 kg (156 lb 8.4 oz)   SpO2 96%   BMI 25.26 kg/m   HEMODYNAMICS:    VENTILATOR SETTINGS:    INTAKE / OUTPUT: I/O last 3 completed shifts: In: 1359.3 [NG/GT:1159.3; IV Piggyback:200] Out: 2475 [Urine:1475; Stool:1000]  PHYSICAL EXAMINATION: General: NAD, RASS 0 Neuro: CNs intact, MAEs HEENT: healing bilateral periorbital contusions, + sclericterus - improving Cardiovascular: regular, no M Lungs: clear anteriorly Abdomen: NTND, BS present Ext: 1-2+ pitting pretibial edema  LABS:  BMET  Recent Labs Lab 01/22/17 0449 01/23/17 0321 01/24/17 0321  NA 137 138 147*  K 3.9 4.0 3.3*  CL 107 106 113*  CO2 26 23 26   BUN 43* 55* 56*  CREATININE 0.80 0.95 0.92  GLUCOSE 117* 78 149*    Electrolytes  Recent Labs Lab 01/21/17 0516 01/22/17 0449 01/23/17 0321 01/24/17 0321  CALCIUM 7.2* 7.3* 7.7* 7.6*  MG 1.8 1.7 2.1  --   PHOS NOT CALCULATED 3.5 3.8  --     CBC  Recent Labs Lab 01/22/17 0449 01/23/17 0321 01/24/17 0321  WBC 27.9* 33.9* 21.4*  HGB 9.0* 10.4* 11.2*  HCT 27.3* 32.2* 33.1*  PLT 48* 79* 106*    Coag's No results for input(s): APTT, INR in the last 168 hours.  Sepsis Markers  Recent Labs Lab 01/17/17 1519 01/19/17 1237 01/24/17 0321  LATICACIDVEN 6.7* 3.3*  --   PROCALCITON  --   --  0.51    ABG  Recent Labs Lab 01/18/17 0430  PHART 7.52*  PCO2ART 29*  PO2ART 92    Liver Enzymes  Recent Labs Lab 01/22/17 0449 01/23/17 0321 01/24/17 0321  AST 240* 229* 206*  ALT 65* 79* 84*  ALKPHOS 252* 336* 291*  BILITOT 10.8* 9.3* 7.5*  ALBUMIN 1.6* 1.9* 2.0*    Cardiac Enzymes No results for  input(s): TROPONINI, PROBNP in the last 168 hours.  Glucose  Recent Labs Lab 01/23/17 0829 01/23/17 1129 01/23/17 1645 01/23/17 2011 01/24/17 0402 01/24/17 0741  GLUCAP 88 100* 139* 147* 132* 121*    CXR: 10/18: LLL atx  ASSESSMENT / PLAN: Severe sepsis/septic shock - resolved E coli bacteremia, source unclear - treated Acute, ventilator dependent respiratory failure - resolved Bilateral pleural effusions - improving Hypervolemia/anasarca - improving Mild hyponatremia - resolved Hypernatremia Hypokalemia Alcoholic liver disease Elevated LFTs Severe hyperbilirubinemia - improving Protein-calorie malnutrition Dysphagia - improving Severe diarrhea - on lactulose ICU acquired anemia without overt blood loss Thrombocytopenia - improving Severe macrocytosis Acute encephalopathy - much improved Severe alcohol abuse Profound deconditioning  Difficult intubation Poor venous access P:  Transfer to Med-surg Continue supplemental O2 to maintain SpO2 > 90% Monitor BMET intermittently Monitor I/Os Correct electrolytes   KCl repletion  Free water repletion Furosemide 1 10/19 Cont tube feedings until able to meet nutritional needs by mouth Cont pured diet with nectar thick liquids per SLP DVT px: LMWH Monitor CBC intermittently Transfuse per usual guidelines  Cont folate and vitamin B12  Monitor temp, WBC count Micro and abx as above  Completed 10 day course of antibiotics  Tramadol when necessary DC lactulose Continue rifaximin Monitor LFTs intermittently DC CVL if PIV can be established  If not, will get PICC PT eval ordered She will eventually need DC to SNF/Rehab facility  After transfer, PCCM will sign off. Please call if we can be of further assistance  Merton Border, MD PCCM service Mobile [phone] Pager [phone]  01/24/2017, 11:43 AM

## 2017-01-25 LAB — COMPREHENSIVE METABOLIC PANEL
ALBUMIN: 2 g/dL — AB (ref 3.5–5.0)
ALK PHOS: 255 U/L — AB (ref 38–126)
ALT: 96 U/L — ABNORMAL HIGH (ref 14–54)
ANION GAP: 7 (ref 5–15)
AST: 213 U/L — AB (ref 15–41)
BILIRUBIN TOTAL: 7.8 mg/dL — AB (ref 0.3–1.2)
BUN: 46 mg/dL — AB (ref 6–20)
CALCIUM: 7.6 mg/dL — AB (ref 8.9–10.3)
CO2: 24 mmol/L (ref 22–32)
Chloride: 116 mmol/L — ABNORMAL HIGH (ref 101–111)
Creatinine, Ser: 0.55 mg/dL (ref 0.44–1.00)
GFR calc Af Amer: >60 mL/min (ref >60)
GFR calc non Af Amer: >60 mL/min (ref >60)
GLUCOSE: 122 mg/dL — AB (ref 65–99)
Potassium: 4.1 mmol/L (ref 3.5–5.1)
SODIUM: 147 mmol/L — AB (ref 135–145)
TOTAL PROTEIN: 4.9 g/dL — AB (ref 6.5–8.1)

## 2017-01-25 LAB — CBC
HCT: 35.3 % (ref 35.0–47.0)
Hemoglobin: 11.6 g/dL — ABNORMAL LOW (ref 12.0–16.0)
MCH: 36.7 pg — ABNORMAL HIGH (ref 26.0–34.0)
MCHC: 32.8 g/dL (ref 32.0–36.0)
MCV: 112.1 fL — AB (ref 80.0–100.0)
PLATELETS: 128 10*3/uL — AB (ref 150–440)
RBC: 3.15 MIL/uL — ABNORMAL LOW (ref 3.80–5.20)
RDW: 19.1 % — ABNORMAL HIGH (ref 11.5–14.5)
WBC: 20.1 10*3/uL — AB (ref 3.6–11.0)

## 2017-01-25 MED ORDER — SODIUM CHLORIDE 0.9% FLUSH: 3.0000 mL | INTRAVENOUS | Status: DC | PRN

## 2017-01-25 MED ORDER — SODIUM CHLORIDE 0.9% FLUSH
3.0000 mL | Freq: Two times a day (BID) | INTRAVENOUS | Status: DC
  Administered 2017-01-25 – 2017-01-28 (×6): 3 mL via INTRAVENOUS

## 2017-01-25 NOTE — Progress Notes (Signed)
Pt had bites for lunch. Pt refused dinner tray.Still eating poorly. Pt on continuous Osmolite tube feeding.Will continue to assess.

## 2017-01-25 NOTE — Clinical Social Work Note (Signed)
CSW aware through chart review that PT has recommended STR. The patient is pending an LTAC screening per the MD note. CSW will follow pending LTAC screening.  Sonya Park, MSW, Latanya Presser [phone]

## 2017-01-25 NOTE — Progress Notes (Signed)
Patient ID: Sonya Park, female   DOB: 1940-11-28, 76 y.o.   MRN: [id number]   [facility] PROGRESS NOTE  Sonya Park:[id number] DOB: 01/10/1941 DOA: 01/14/2017 PCP: Cletis Athens, MD  HPI/Subjective: Patient transferred to the floor yesterday. Currently denying any symptoms  Objective: Vitals:   01/25/17 0539 01/25/17 1217  BP: (!) 148/85 (!) 151/82  Pulse: (!) 102 95  Resp: 18   Temp: 98.8 F (37.1 C) 98.2 F (36.8 C)  SpO2: 100% 100%    Filed Weights   01/15/17 1530 01/24/17 2258 01/25/17 0500  Weight: 156 lb 8.4 oz (71 kg) 153 lb (69.4 kg) 146 lb (66.2 kg)    ROS: Review of Systems  Constitutional: Negative for fever.  Respiratory: Negative for shortness of breath.   Cardiovascular: Negative for chest pain.  Gastrointestinal: Negative for abdominal pain, nausea and vomiting.  Musculoskeletal: Negative for joint pain.  Neurological: Positive for headaches.   Exam: Physical Exam  HENT:  Nose: No mucosal edema.  Mouth/Throat: No oropharyngeal exudate.  Eyes: Pupils are equal, round, and reactive to light. Lids are normal.  Icteric.   Neck: Carotid bruit is not present. No thyromegaly present.  Cardiovascular: Regular rhythm, S1 normal and S2 normal.  Tachycardia present.   Respiratory: She has decreased breath sounds in the right lower field and the left lower field. She has no wheezes. She has no rhonchi. She has no rales.  GI: Soft. Bowel sounds are normal. There is no tenderness.  Musculoskeletal:       Right elbow: She exhibits swelling.       Left elbow: She exhibits swelling.       Right wrist: She exhibits swelling.       Left wrist: She exhibits tenderness.       Right knee: She exhibits swelling.       Left knee: She exhibits swelling.       Right ankle: She exhibits swelling.       Left ankle: She exhibits swelling.  Lymphadenopathy:    She has no cervical adenopathy.  Neurological: She is alert.  Patient barely able to straight leg  raise. Patient able to lift arms up off the bed  Skin:  Large ulceration left posterior calf. Demarcation of the toes with a dusky look. Left third and fourth toe still brownish lookingBruising upper extremities and lower extremities.  Bruising around bilateral eyelids. Jaundice.  Psychiatric: She has a normal mood and affect.      Data Reviewed: Basic Metabolic Panel:  Recent Labs Lab 01/19/17 0512  01/20/17 0503  01/21/17 0459 01/22/17 0449 01/23/17 0321 01/24/17 0321 01/25/17 0515  NA 134*  < >  --   < > 133* 137 138 147* 147*  K 4.2  < >  --   < > 4.5 3.9 4.0 3.3* 4.1  CL 100*  < >  --   < > 105 107 106 113* 116*  CO2 23  < >  --   < > '25 26 23 26 24  '$ GLUCOSE 102*  < >  --   < > 95 117* 78 149* 122*  BUN 19  < >  --   < > 31* 43* 55* 56* 46*  CREATININE 0.81  < >  --   < > 0.93 0.80 0.95 0.92 0.55  CALCIUM 6.7*  < >  --   < > 7.2* 7.3* 7.7* 7.6* 7.6*  MG 1.9  --  1.8  --  1.8  1.7 2.1  --   --   PHOS 2.8  --  3.6  --  NOT CALCULATED 3.5 3.8  --   --   < > = values in this interval not displayed. Liver Function Tests:  Recent Labs Lab 01/21/17 0516 01/22/17 0449 01/23/17 0321 01/24/17 0321 01/25/17 0515  AST 227* 240* 229* 206* 213*  ALT 51 65* 79* 84* 96*  ALKPHOS 166* 252* 336* 291* 255*  BILITOT 12.2* 10.8* 9.3* 7.5* 7.8*  PROT 3.8* 3.8* 4.7* 4.8* 4.9*  ALBUMIN 1.7* 1.6* 1.9* 2.0* 2.0*   No results for input(s): AMMONIA in the last 168 hours. CBC:  Recent Labs Lab 01/21/17 0516 01/22/17 0449 01/23/17 0321 01/24/17 0321 01/25/17 0515  WBC 22.6* 27.9* 33.9* 21.4* 20.1*  HGB 8.2* 9.0* 10.4* 11.2* 11.6*  HCT 24.7* 27.3* 32.2* 33.1* 35.3  MCV 107.8* 107.6* 109.3* 109.6* 112.1*  PLT 30* 48* 79* 106* 128*    CBG:  Recent Labs Lab 01/23/17 1645 01/23/17 2011 01/24/17 0402 01/24/17 0741 01/24/17 2010  GLUCAP 139* 147* 132* 121* 107*    Recent Results (from the past 240 hour(s))  MRSA PCR Screening     Status: None   Collection Time: 01/20/17  11:04 AM  Result Value Ref Range Status   MRSA by PCR NEGATIVE NEGATIVE Final    Comment:        The GeneXpert MRSA Assay (FDA approved for NASAL specimens only), is one component of a comprehensive MRSA colonization surveillance program. It is not intended to diagnose MRSA infection nor to guide or monitor treatment for MRSA infections.      Studies: No results found.  Scheduled Meds: . chlorhexidine gluconate (MEDLINE KIT)  15 mL Mouth Rinse BID  . Copper Gluconate  1 tablet Oral Daily  . enoxaparin (LOVENOX) injection  40 mg Subcutaneous Q24H  . feeding supplement (PRO-STAT SUGAR FREE 64)  30 mL Per Tube BID  . folic acid  1 mg Per Tube Daily  . free water  200 mL Per Tube Q4H  . multivitamin  15 mL Per Tube Daily  . phytonadione  10 mg Per Tube Daily  . rifaximin  550 mg Per Tube BID  . sodium chloride flush  10-40 mL Intracatheter Q12H  . thiamine  100 mg Per Tube Daily  . vitamin A  20,000 Units Oral Daily  . vitamin B-12  1,000 mcg Per Tube Daily  . zinc sulfate  220 mg Per Tube Daily   Continuous Infusions: . feeding supplement (OSMOLITE 1.5 CAL) 1,000 mL (01/23/17 1245)    Assessment/Plan:  1. Septic shock with Escherichia coli.  status post treatment with Cipro 2. Acute respiratory failure with hypoxia. Patient extubated 01/22/2017. On nasal cannula. 3. Alcohol abuse.  4. Acute encephalopathy secondary to severe sepsis.  continue Xifaxan just in case this was secondary to hepatic encephalopthy. Mental status much improved.  Lactulose stopped because of diarrhea and rectal tube 5. Pancytopenia could be secondary to alcohol abuse and/or sepsis. Platelet count recovered 6. Hypomagnesemia, hypocalcemia and hypokalemia. Potassium replacement 7. Severe malnutrition, Anasarca and third spacing of fluids.  Tube feeding resumed. Patient also placed on diet. 8. Jaundice likely secondary to shock. Could also be secondary to prior alcohol abuse. Total bilirubin trending  better 9. Facial trauma at home. Fractured nasal bones. Hematoma and bruising around the eyelids. 10. Anasarca given a dose of Lasix yesterday 11. Physical therapy evaluation 12. Potential candidate for LTAC  Code Status:     Code  Status Orders        Start     Ordered   01/15/17 0158  Full code  Continuous     01/15/17 0157    Code Status History    Date Active Date Inactive Code Status Order ID Comments User Context   12/13/2015 11:49 AM 12/15/2015  7:28 PM Full Code [id number]  Dereck Leep, MD Inpatient   09/17/2014  6:01 AM 09/18/2014  3:51 PM Full Code [id number]  Juluis Mire, MD Inpatient     Family Communication: spoke with husband at the bedside Disposition Plan: potential candidate for LTAC  Consultants:  Critical care specialist  Antibiotics:  Cipro  Time spent: 20 minutes  [place], [facility]

## 2017-01-26 ENCOUNTER — Inpatient Hospital Stay: Payer: Medicare Other

## 2017-01-26 LAB — CBC
HCT: 28.9 % — ABNORMAL LOW (ref 35.0–47.0)
Hemoglobin: 9.3 g/dL — ABNORMAL LOW (ref 12.0–16.0)
MCH: 36.1 pg — ABNORMAL HIGH (ref 26.0–34.0)
MCHC: 32.1 g/dL (ref 32.0–36.0)
MCV: 112.7 fL — AB (ref 80.0–100.0)
PLATELETS: 143 10*3/uL — AB (ref 150–440)
RBC: 2.56 MIL/uL — ABNORMAL LOW (ref 3.80–5.20)
RDW: 18.5 % — AB (ref 11.5–14.5)
WBC: 18.1 10*3/uL — AB (ref 3.6–11.0)

## 2017-01-26 LAB — BASIC METABOLIC PANEL
ANION GAP: 6 (ref 5–15)
BUN: 41 mg/dL — ABNORMAL HIGH (ref 6–20)
CALCIUM: 7.8 mg/dL — AB (ref 8.9–10.3)
CO2: 26 mmol/L (ref 22–32)
CREATININE: 0.49 mg/dL (ref 0.44–1.00)
Chloride: 113 mmol/L — ABNORMAL HIGH (ref 101–111)
GFR calc Af Amer: >60 mL/min (ref >60)
GLUCOSE: 106 mg/dL — AB (ref 65–99)
Potassium: 3.3 mmol/L — ABNORMAL LOW (ref 3.5–5.1)
Sodium: 145 mmol/L (ref 135–145)

## 2017-01-26 LAB — AMMONIA: Ammonia: 53 umol/L — ABNORMAL HIGH (ref 9–35)

## 2017-01-26 MED ORDER — POTASSIUM CHLORIDE 10 MEQ/100ML IV SOLN
10.0000 meq | Freq: Once | INTRAVENOUS | Status: AC
  Administered 2017-01-26: 10 meq via INTRAVENOUS
  Filled 2017-01-26: qty 100

## 2017-01-26 MED ORDER — FUROSEMIDE 10 MG/ML IJ SOLN
20.0000 mg | Freq: Two times a day (BID) | INTRAMUSCULAR | Status: DC
  Administered 2017-01-26 – 2017-02-08 (×26): 20 mg via INTRAVENOUS
  Filled 2017-01-26 (×3): qty 4
  Filled 2017-01-26: qty 2
  Filled 2017-01-26 (×2): qty 4
  Filled 2017-01-26 (×2): qty 2
  Filled 2017-01-26 (×5): qty 4
  Filled 2017-01-26: qty 2
  Filled 2017-01-26 (×6): qty 4
  Filled 2017-01-26: qty 2
  Filled 2017-01-26: qty 4
  Filled 2017-01-26: qty 2
  Filled 2017-01-26 (×2): qty 4
  Filled 2017-01-26: qty 2

## 2017-01-26 NOTE — NC FL2 (Signed)
[place] LEVEL OF CARE SCREENING TOOL     IDENTIFICATION  Patient Name: Sonya Park Birthdate: 09-Sep-1940 Sex: female Admission Date (Current Location): 01/14/2017  [place] and [place] Number:  Engineering geologist and Address:  [facility], [address] [place], [place] [address]      Provider Number: [id number]  Attending Physician Name and Address:  Sonya Flock, MD  Relative Name and Phone Number:  [name] (spouse) [phone]    Current Level of Care: Hospital Recommended Level of Care: [place] Prior Approval Number:    Date Approved/Denied:   PASRR Number:    Discharge Plan: SNF    Current Diagnoses: Patient Active Problem List   Diagnosis Date Noted  . Hyperbilirubinemia   . Alcoholic liver disease ([place])   . Thrombocytopenia ([place])   . Anasarca   . Pressure injury of skin 01/19/2017  . Acute respiratory failure ([place])   . Bacteremia   . Palliative care by specialist   . Goals of care, counseling/discussion   . Alcohol abuse 01/15/2017  . Sepsis ([place]) 01/14/2017  . Facial trauma 01/14/2017  . Osteoporosis 01/14/2017  . Neutropenia ([place]) 01/14/2017  . 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 Special screening for malignant neoplasms, colon   . Benign neoplasm of transverse colon   . Fracture of right inferior pubic ramus ([place]) 09/17/2014  . Compression fracture of L3 lumbar vertebra ([facility]) 09/17/2014    Orientation RESPIRATION BLADDER Height & Weight     Self, Place  O2 (5L o2) Incontinent Weight: 144 lb (65.3 kg) Height:  _0  (167.6 cm)  BEHAVIORAL SYMPTOMS/MOOD NEUROLOGICAL BOWEL NUTRITION STATUS      Incontinent Diet, NG/panda (Nectar-thick liquid (some purees - Full Liquid diet w/ Nectar consistency); Liquids provided via: Teaspoon;Cup; Medications via NG; Supervision: Staff to assist with self feeding;Full supervision/cueing for compensatory strategies)  AMBULATORY STATUS  COMMUNICATION OF NEEDS Skin   Extensive Assist Verbally Normal                       Personal Care Assistance Level of Assistance  Bathing, Feeding, Dressing Bathing Assistance: Maximum assistance Feeding assistance: Limited assistance Dressing Assistance: Maximum assistance     Functional Limitations Info             SPECIAL CARE FACTORS FREQUENCY  PT (By licensed PT)     PT Frequency: Up to 5X per day, 5 days per week              Contractures Contractures Info: Not present    Additional Factors Info  Code Status, Allergies Code Status Info: Full Allergies Info: "mycin" doesn't know which one; Penicillins           Current Medications (01/26/2017):  This is the current hospital active medication list Current Facility-Administered Medications  Medication Dose Route Frequency Provider Last Rate Last Dose  . chlorhexidine gluconate (MEDLINE KIT) (PERIDEX) 0.12 % solution 15 mL  15 mL Mouth Rinse BID Wilhelmina Mcardle, MD   15 mL at 01/25/17 2040  . Copper Gluconate TABS 1 tablet  1 tablet Oral Daily Wieting, Richard, MD      . enoxaparin (LOVENOX) injection 40 mg  40 mg Subcutaneous Q24H Wilhelmina Mcardle, MD   40 mg at 01/25/17 2147  . feeding supplement (OSMOLITE 1.5 CAL) liquid 1,000 mL  1,000 mL Per Tube Continuous Wilhelmina Mcardle, MD 40 mL/hr at 01/25/17 1850 1,000 mL at 01/25/17  1850  . feeding supplement (PRO-STAT SUGAR FREE 64) liquid 30 mL  30 mL Per Tube BID Wilhelmina Mcardle, MD   30 mL at 01/26/17 0918  . folic acid (FOLVITE) tablet 1 mg  1 mg Per Tube Daily Wilhelmina Mcardle, MD   1 mg at 01/26/17 4944  . free water 200 mL  200 mL Per Tube Q4H Wilhelmina Mcardle, MD   200 mL at 01/26/17 0946  . multivitamin liquid 15 mL  15 mL Per Tube Daily Awilda Bill, NP   15 mL at 01/26/17 0918  . ondansetron (ZOFRAN) injection 4 mg  4 mg Intravenous Q6H PRN Lance Coon, MD      . phytonadione (VITAMIN K) tablet 10 mg  10 mg Per Tube Daily Wilhelmina Mcardle, MD    10 mg at 01/26/17 9675  . rifaximin (XIFAXAN) tablet 550 mg  550 mg Per Tube BID Wilhelmina Mcardle, MD   550 mg at 01/26/17 9163  . sodium chloride flush (NS) 0.9 % injection 3 mL  3 mL Intravenous Q12H Sonya Flock, MD   3 mL at 01/26/17 0919  . sodium chloride flush (NS) 0.9 % injection 3 mL  3 mL Intravenous PRN Sonya Flock, MD      . thiamine (VITAMIN B-1) tablet 100 mg  100 mg Per Tube Daily Napoleon Form, RPH   100 mg at 01/26/17 8466  . traMADol (ULTRAM) tablet 50 mg  50 mg Per Tube Q6H PRN Wilhelmina Mcardle, MD   50 mg at 01/24/17 1315  . vitamin A capsule 20,000 Units  20,000 Units Oral Daily Flora Lipps, MD   20,000 Units at 01/24/17 1315  . vitamin B-12 (CYANOCOBALAMIN) tablet 1,000 mcg  1,000 mcg Per Tube Daily Wilhelmina Mcardle, MD   1,000 mcg at 01/26/17 5993  . zinc sulfate capsule 220 mg  220 mg Per Tube Daily Flora Lipps, MD   220 mg at 01/26/17 5701     Discharge Medications: Please see discharge summary for a list of discharge medications.  Relevant Imaging Results:  Relevant Lab Results:   Additional Information SS# [id number]  Zettie Pho, LCSW

## 2017-01-26 NOTE — Progress Notes (Signed)
Dressings changed to right thigh left thigh and sacral foam dressing changed, pt. Tolerated well. Pt. Continues to have weeping edema to RUE and generalized edema to entire body.

## 2017-01-26 NOTE — Progress Notes (Addendum)
Patient ID: Sonya Park, female   DOB: [birth year], 76 y.o.   MRN: [id number]   [facility] PROGRESS NOTE  Sonya Park [id number] DOB: 10/09/1940 DOA: 01/14/2017 PCP: Cletis Athens, MD  HPI/Subjective: Patient confused to increase her oxygen requirement to 4 L  Objective: Vitals:   01/26/17 0501 01/26/17 0917  BP: 125/69 135/85  Pulse: (!) 104 (!) 103  Resp: 18   Temp: 97.9 F (36.6 C)   SpO2: 98% 97%    Filed Weights   01/24/17 2258 01/25/17 0500 01/26/17 0501  Weight: 153 lb (69.4 kg) 146 lb (66.2 kg) 144 lb (65.3 kg)    ROS: Review of Systems  Unable to perform ROS: Mental status change  Constitutional: Negative for fever.   Exam: Physical Exam  HENT:  Nose: No mucosal edema.  Mouth/Throat: No oropharyngeal exudate.  Eyes: Pupils are equal, round, and reactive to light. Lids are normal.  Icteric.   Neck: Carotid bruit is not present. No thyromegaly present.  Cardiovascular: Regular rhythm, S1 normal and S2 normal.   Respiratory: She has decreased breath sounds in the right lower field and the left lower field. She has no wheezes. She has no rhonchi. She has no rales.  GI: Soft. Bowel sounds are normal. There is no tenderness.  Musculoskeletal:       Right elbow: She exhibits swelling.       Left elbow: She exhibits swelling.       Right wrist: She exhibits swelling.       Left wrist: She exhibits tenderness.       Right knee: She exhibits swelling.       Left knee: She exhibits swelling.       Right ankle: She exhibits swelling.       Left ankle: She exhibits swelling.  Lymphadenopathy:    She has no cervical adenopathy.  Neurological: She is alert.  Patient barely able to straight leg raise. Patient able to lift arms up off the bed  Skin:  Large ulceration left posterior calf. Demarcation of the toes with a dusky look. Left third and fourth toe still brownish lookingBruising upper extremities and lower extremities.  Bruising around bilateral  eyelids. Jaundice.  Psychiatric: She has a normal mood and affect.      Data Reviewed: Basic Metabolic Panel:  Recent Labs Lab 01/20/17 0503  01/21/17 1962 01/22/17 2297 01/23/17 0321 01/24/17 0321 01/25/17 0515 01/26/17 0439  NA  --   < > 133* 137 138 147* 147* 145  K  --   < > 4.5 3.9 4.0 3.3* 4.1 3.3*  CL  --   < > 105 107 106 113* 116* 113*  CO2  --   < > _0 GLUCOSE  --   < > 95 117* 78 149* 122* 106*  BUN  --   < > 31* 43* 55* 56* 46* 41*  CREATININE  --   < > 0.93 0.80 0.95 0.92 0.55 0.49  CALCIUM  --   < > 7.2* 7.3* 7.7* 7.6* 7.6* 7.8*  MG 1.8  --  1.8 1.7 2.1  --   --   --   PHOS 3.6  --  NOT CALCULATED 3.5 3.8  --   --   --   < > = values in this interval not displayed. Liver Function Tests:  Recent Labs Lab 01/21/17 0516 01/22/17 0449 01/23/17 0321 01/24/17 0321 01/25/17 0515  AST 227* 240* 229* 206* 213*  ALT 51 65* 79* 84* 96*  ALKPHOS 166* 252* 336* 291* 255*  BILITOT 12.2* 10.8* 9.3* 7.5* 7.8*  PROT 3.8* 3.8* 4.7* 4.8* 4.9*  ALBUMIN 1.7* 1.6* 1.9* 2.0* 2.0*    Recent Labs Lab 01/26/17 0439  AMMONIA 53*   CBC:  Recent Labs Lab 01/22/17 0449 01/23/17 0321 01/24/17 0321 01/25/17 0515 01/26/17 0439  WBC 27.9* 33.9* 21.4* 20.1* 18.1*  HGB 9.0* 10.4* 11.2* 11.6* 9.3*  HCT 27.3* 32.2* 33.1* 35.3 28.9*  MCV 107.6* 109.3* 109.6* 112.1* 112.7*  PLT 48* 79* 106* 128* 143*    CBG:  Recent Labs Lab 01/23/17 1645 01/23/17 2011 01/24/17 0402 01/24/17 0741 01/24/17 2010  GLUCAP 139* 147* 132* 121* 107*    Recent Results (from the past 240 hour(s))  MRSA PCR Screening     Status: None   Collection Time: 01/20/17 11:04 AM  Result Value Ref Range Status   MRSA by PCR NEGATIVE NEGATIVE Final    Comment:        The GeneXpert MRSA Assay (FDA approved for NASAL specimens only), is one component of a comprehensive MRSA colonization surveillance program. It is not intended to diagnose MRSA infection nor to guide  or monitor treatment for MRSA infections.      Studies: Dg Chest Port 1 View  Result Date: 01/26/2017 CLINICAL DATA:  Acute respiratory failure EXAM: PORTABLE CHEST 1 VIEW COMPARISON:  01/23/2017 FINDINGS: A small bore feeding tube is noted entering the stomach with tip off the field of view. A right IJ central venous catheter has been removed. Increased hazy opacity overlying the lower right hemithorax probably represents layering effusion and atelectasis. Left lower lung consolidation/atelectasis and effusion again noted. There is no evidence of pneumothorax. IMPRESSION: Increased opacity overlying the lower right hemithorax likely representing layering effusion and atelectasis. Unchanged left lower lung consolidation/ atelectasis and small left effusion. Right IJ central venous catheter removed. Electronically Signed   By: Margarette Canada M.D.   On: 01/26/2017 10:08    Scheduled Meds: . chlorhexidine gluconate (MEDLINE KIT)  15 mL Mouth Rinse BID  . Copper Gluconate  1 tablet Oral Daily  . enoxaparin (LOVENOX) injection  40 mg Subcutaneous Q24H  . feeding supplement (PRO-STAT SUGAR FREE 64)  30 mL Per Tube BID  . folic acid  1 mg Per Tube Daily  . free water  200 mL Per Tube Q4H  . multivitamin  15 mL Per Tube Daily  . phytonadione  10 mg Per Tube Daily  . rifaximin  550 mg Per Tube BID  . sodium chloride flush  3 mL Intravenous Q12H  . thiamine  100 mg Per Tube Daily  . vitamin A  20,000 Units Oral Daily  . vitamin B-12  1,000 mcg Per Tube Daily  . zinc sulfate  220 mg Per Tube Daily   Continuous Infusions: . feeding supplement (OSMOLITE 1.5 CAL) 1,000 mL (01/25/17 1850)    Assessment/Plan:  1. Septic shock with Escherichia coli.  status post treatment with Cipro.white blood cell continues to be elevated will Pain blood cultures again 2. Acute respiratory failure with hypoxia. Patient extubated 01/22/2017. On nasal cannula.now back on 5 L chest x-ray shows possible  fusions 3. Alcohol abuse. No evidence of withdrawal 4. Acute encephalopathy secondary to severe sepsis.  continue Xifaxan just in case this was secondary to hepatic encephalopthy. Mental status confused again 5. Pancytopenia could be secondary to alcohol abuse and/or sepsis. Platelet count recovered 6. Hypomagnesemia, hypocalcemia and hypokalemia. Potassium replacement 7. Severe  malnutrition, Anasarca and third spacing of fluids.  Tube feeding resumed. Patient also placed on diet. 8. Jaundice likely secondary to shock. Could also be secondary to prior alcohol abuse.   9. Facial trauma at home. Fractured nasal bones. Hematoma and bruising around the eyelids. 10. Anasarca start low dose lasix 11. Physical therapy evaluation 12. Potential candidate for LTAC  Code Status:     Code Status Orders        Start     Ordered   01/15/17 0158  Full code  Continuous     01/15/17 0157    Code Status History    Date Active Date Inactive Code Status Order ID Comments User Context   12/13/2015 11:49 AM 12/15/2015  7:28 PM Full Code [id number]  Dereck Leep, MD Inpatient   09/17/2014  6:01 AM 09/18/2014  3:51 PM Full Code [id number]  Juluis Mire, MD Inpatient     Family Communication: spoke with husband at the bedside Disposition Plan: potential candidate for LTAC  Consultants:  Critical care specialist  Antibiotics:  Cipro  Time spent: 25 minutes  [place], [facility]

## 2017-01-27 ENCOUNTER — Inpatient Hospital Stay: Payer: Medicare Other

## 2017-01-27 LAB — COMPREHENSIVE METABOLIC PANEL
ALT: 116 U/L — ABNORMAL HIGH (ref 14–54)
AST: 233 U/L — ABNORMAL HIGH (ref 15–41)
Albumin: 1.8 g/dL — ABNORMAL LOW (ref 3.5–5.0)
Alkaline Phosphatase: 235 U/L — ABNORMAL HIGH (ref 38–126)
Anion gap: 11 (ref 5–15)
BILIRUBIN TOTAL: 4.4 mg/dL — AB (ref 0.3–1.2)
BUN: 43 mg/dL — ABNORMAL HIGH (ref 6–20)
CHLORIDE: 113 mmol/L — AB (ref 101–111)
CO2: 25 mmol/L (ref 22–32)
CREATININE: 0.32 mg/dL — AB (ref 0.44–1.00)
Calcium: 7.6 mg/dL — ABNORMAL LOW (ref 8.9–10.3)
Glucose, Bld: 109 mg/dL — ABNORMAL HIGH (ref 65–99)
POTASSIUM: 4.4 mmol/L (ref 3.5–5.1)
Sodium: 149 mmol/L — ABNORMAL HIGH (ref 135–145)
TOTAL PROTEIN: 4.4 g/dL — AB (ref 6.5–8.1)

## 2017-01-27 LAB — PROCALCITONIN: PROCALCITONIN: 0.44 ng/mL

## 2017-01-27 LAB — CBC
HEMATOCRIT: 32.2 % — AB (ref 35.0–47.0)
Hemoglobin: 10.2 g/dL — ABNORMAL LOW (ref 12.0–16.0)
MCH: 36.4 pg — AB (ref 26.0–34.0)
MCHC: 31.8 g/dL — ABNORMAL LOW (ref 32.0–36.0)
MCV: 114.5 fL — AB (ref 80.0–100.0)
PLATELETS: 156 10*3/uL (ref 150–440)
RBC: 2.81 MIL/uL — ABNORMAL LOW (ref 3.80–5.20)
RDW: 19 % — AB (ref 11.5–14.5)
WBC: 21.9 10*3/uL — ABNORMAL HIGH (ref 3.6–11.0)

## 2017-01-27 MED ORDER — VITAMIN D 1000 UNITS PO TABS
2000.0000 [IU] | ORAL_TABLET | Freq: Every day | ORAL | Status: DC
  Administered 2017-01-27 – 2017-02-11 (×16): 2000 [IU] via ORAL
  Filled 2017-01-27 (×16): qty 2

## 2017-01-27 MED ORDER — SODIUM CHLORIDE 0.9 % IV SOLN
1.0000 g | Freq: Three times a day (TID) | INTRAVENOUS | Status: DC
  Administered 2017-01-27 – 2017-01-31 (×11): 1 g via INTRAVENOUS
  Filled 2017-01-27 (×15): qty 1

## 2017-01-27 MED ORDER — OSMOLITE 1.5 CAL PO LIQD
1000.0000 mL | Freq: Every day | ORAL | Status: DC
  Administered 2017-01-27 – 2017-01-30 (×4): 1000 mL

## 2017-01-27 MED ORDER — VANCOMYCIN HCL IN DEXTROSE 1-5 GM/200ML-% IV SOLN
1000.0000 mg | Freq: Once | INTRAVENOUS | Status: AC
  Administered 2017-01-27: 1000 mg via INTRAVENOUS
  Filled 2017-01-27: qty 200

## 2017-01-27 MED ORDER — ACETYLCYSTEINE 20 % IN SOLN
600.0000 mg | Freq: Two times a day (BID) | RESPIRATORY_TRACT | Status: AC
  Administered 2017-01-27: 800 mg via ORAL
  Administered 2017-01-27 – 2017-01-28 (×3): 600 mg via ORAL
  Filled 2017-01-27 (×4): qty 4

## 2017-01-27 MED ORDER — OSMOLITE 1.5 CAL PO LIQD: 1000.0000 mL | Freq: Every day | ORAL | Status: DC

## 2017-01-27 MED ORDER — IPRATROPIUM-ALBUTEROL 0.5-2.5 (3) MG/3ML IN SOLN
3.0000 mL | Freq: Four times a day (QID) | RESPIRATORY_TRACT | Status: DC
  Administered 2017-01-27 – 2017-01-29 (×8): 3 mL via RESPIRATORY_TRACT
  Filled 2017-01-27 (×9): qty 3

## 2017-01-27 MED ORDER — VANCOMYCIN HCL IN DEXTROSE 750-5 MG/150ML-% IV SOLN
750.0000 mg | Freq: Two times a day (BID) | INTRAVENOUS | Status: DC
  Administered 2017-01-27: 750 mg via INTRAVENOUS
  Filled 2017-01-27 (×3): qty 150

## 2017-01-27 MED ORDER — FLUCONAZOLE IN SODIUM CHLORIDE 200-0.9 MG/100ML-% IV SOLN
200.0000 mg | INTRAVENOUS | Status: DC
  Administered 2017-01-27 – 2017-01-31 (×5): 200 mg via INTRAVENOUS
  Filled 2017-01-27 (×7): qty 100

## 2017-01-27 NOTE — Care Management Important Message (Signed)
Important Message  Patient Details  Name: Sonya Park MRN: [id number] Date of Birth: 06/26/1940   Medicare Important Message Given:  Yes    Shelbie Ammons, RN 01/27/2017, 11:18 AM

## 2017-01-27 NOTE — Progress Notes (Signed)
Initial Nutrition Assessment  DOCUMENTATION CODES:   Non-severe (moderate) malnutrition in context of chronic illness  INTERVENTION:   Discussed POC with Husband and RN. Plan for Cyclic feeds from 0932-3557. Osmolite 1.5 at 105mL/hr, Pro-stat 88mL BID, regimen provides 1640 calories, 90gm protein, 783mL H2O daily. Will allow patient to try to eat more during the day.  Continue liquid MVI  Continue 228mL free water Q4H, provides total 19108mL free water daily  Continue daily weights  -Recommend 16 mg elemental zinc PO for 6 months (200% RDI), 2 mg/day IV copper for 6 days (followed by 1 mg PO daily until zinc supplementation is complete), 10000-25000 IU/day vitamin A orally/per tube for 1-2 weeks, 1-2 mg/day vitamin K orally/per tube. -Micronutrient supplementation was started on 10/15. Recommend continuing supplementation of vitamin A and vitamin K until 10/22. Continue zinc supplementation for 6 months (and appropriate dose of copper to prevent recurrence of copper deficiency - recommended ratio is 1 mg copper for each 8-15 mg elemental zinc). -Patient will need close monitoring of nutrition-related labs every 3 months.  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 Intubated on 10/10. Extubated 01/22/2017 Elevated procalcitonin now on broad-spectrum antibiotics, ID to see patient.  Discussed patient with Husband and RN. Becoming more alert, speaking some to husband saying she is hungry, thirsty. Transitioning to cyclic feeds may help patient eat more during that day. He states she only ate 5 bites of ice cream. Lack of PO intake could also be related to weakness from dysphagia and intubation.  Labs reviewed:  Na 149, BUN 43, AST 233, ALT 116, TBili 4.4,  Medications reviewed and include:  Vitamin D, Copper Gluconate, Folic Acid, B1, D22, Zinc   Intake/Output Summary (Last 24 hours) at 01/27/17 1637 Last data filed at 01/27/17 1349  Gross per 24 hour  Intake             2175 ml  Output             2100 ml  Net               75 ml   UOP 1072mL yesterday NGT tip to stomach Some generalized edema.  Diet Order:  Diet full liquid Room service appropriate? Yes with Assist; Fluid consistency: Nectar Thick  Skin:  Wound (see comment) (Stg I buttocks, weeping to arms and legs, cellulitis lower legs, blisters arms and legs, skin tear left leg)  Last BM:  01/27/2017(type 7)  Height:   Ht Readings from Last 1 Encounters:  01/14/17 5\' 6"  (1.676 m)    Weight:   Wt Readings from Last 1 Encounters:  01/27/17 143 lb (64.9  kg)    Ideal Body Weight:  59.1 kg  BMI:  Body mass index is 23.08 kg/m.  Estimated Nutritional Needs:   Kcal:  1475-1700 (MSJ x 1.3-1.5)  Protein:  85-100 grams (1.4-1.6 grams/kg)  Fluid:  1.5-1.8 L/day (25-30 ml/kg)  EDUCATION NEEDS:   Education needs addressed  Satira Anis. Devion Chriscoe, MS, RD LDN Inpatient Clinical Dietitian Pager [phone]

## 2017-01-27 NOTE — Progress Notes (Addendum)
Pharmacy Antibiotic Note  Sonya Park is a 76 y.o. female with recent treatment for E coli bacteremia with Cipro who continues to have elevated WBC and PCT.  Pharmacy has been consulted for vancomycin and meropenem dosing.  Plan: Ke= 0.052 h-1 Vd= 41.1 L, T1/2 13.5 hr   Vancomycin 1000 mg iv once then vancomycin 1000 IV every 18 hours with stacked dosing and a trough with the 4th dose.  Predicted trough 16 mcg/mL. Goal trough 15-20 mcg/mL.  Meropenem 1 g iv q 8 hours.   Height: 5\' 6"  (167.6 cm) Weight: 143 lb (64.9 kg) IBW/kg (Calculated) : 59.3  Temp (24hrs), Avg:98.5 F (36.9 C), Min:98 F (36.7 C), Max:99 F (37.2 C)   Recent Labs Lab 01/23/17 0321 01/24/17 0321 01/25/17 0515 01/26/17 0439 01/27/17 0538  WBC 33.9* 21.4* 20.1* 18.1* 21.9*  CREATININE 0.95 0.92 0.55 0.49 0.32*    Estimated Creatinine Clearance: 56.9 mL/min (A) (by C-G formula based on SCr of 0.32 mg/dL (L)).    Allergies  Allergen Reactions  . Other Itching    "mycin" doesn't know which one  . Penicillins Swelling    Has patient had a PCN reaction causing immediate rash, facial/tongue/throat swelling, SOB or lightheadedness with hypotension: Yes Has patient had a PCN reaction causing severe rash involving mucus membranes or skin necrosis: No Has patient had a PCN reaction that required hospitalization: No Has patient had a PCN reaction occurring within the last 10 years: No If all of the above answers are "NO", then may proceed with Cephalosporin use.    Antimicrobials this admission: S/P ciprofloxacin 10/12 >> 10/18 Meropenem 10/22 >>  Vancomycin 10/22 >>   Dose adjustments this admission:   Microbiology results: 10/9 BCx: E coli 10/21 BCx: NGTD 10/15 MRSA PCR: negative  Thank you for allowing pharmacy to be a part of this patient's care.  Napoleon Form 01/27/2017 4:57 PM

## 2017-01-27 NOTE — Progress Notes (Addendum)
Patient ID: Sonya Park, female   DOB: Jun 27, 1940, 76 y.o.   MRN: [id number]   [facility] PROGRESS NOTE  Sonya Park OMV:[id number] DOB: 11-06-1940 DOA: 01/14/2017 PCP: Cletis Athens, MD  HPI/Subjective: Patient having hard time with speaking husband at bedside  Objective: Vitals:   01/27/17 0310 01/27/17 0929  BP: 138/83 137/75  Pulse: 100 98  Resp: 18 19  Temp: 98 F (36.7 C)   SpO2: 98% 98%    Filed Weights   01/25/17 0500 01/26/17 0501 01/27/17 0310  Weight: 146 lb (66.2 kg) 144 lb (65.3 kg) 143 lb (64.9 kg)    ROS: Review of Systems  Unable to perform ROS: Mental status change  Constitutional: Negative for fever.   Exam: Physical Exam  HENT:  Nose: No mucosal edema.  Mouth/Throat: No oropharyngeal exudate.  Eyes: Pupils are equal, round, and reactive to light. Lids are normal.  Icteric.   Neck: Carotid bruit is not present. No thyromegaly present.  Cardiovascular: Regular rhythm, S1 normal and S2 normal.   Respiratory: She has decreased breath sounds in the right lower field and the left lower field. She has no wheezes. She has no rhonchi. She has no rales.  GI: Soft. Bowel sounds are normal. There is no tenderness.  Musculoskeletal:       Right elbow: She exhibits swelling.       Left elbow: She exhibits swelling.       Right wrist: She exhibits swelling.       Left wrist: She exhibits tenderness.       Right knee: She exhibits swelling.       Left knee: She exhibits swelling.       Right ankle: She exhibits swelling.       Left ankle: She exhibits swelling.  Lymphadenopathy:    She has no cervical adenopathy.  Neurological: She is alert.  Patient barely able to straight leg raise. Patient able to lift arms up off the bed  Skin:  Large ulceration left posterior calf. Demarcation of the toes with a dusky look. Left third and fourth toe still brownish lookingBruising upper extremities and lower extremities.  Bruising around bilateral  eyelids. Jaundice.  Psychiatric: She has a normal mood and affect.      Data Reviewed: Basic Metabolic Panel:  Recent Labs Lab 01/21/17 0516 01/22/17 0449 01/23/17 0321 01/24/17 0321 01/25/17 0515 01/26/17 0439 01/27/17 0538  NA 133* 137 138 147* 147* 145 149*  K 4.5 3.9 4.0 3.3* 4.1 3.3* 4.4  CL 105 107 106 113* 116* 113* 113*  CO2 '25 26 23 26 24 26 25  '$ GLUCOSE 95 117* 78 149* 122* 106* 109*  BUN 31* 43* 55* 56* 46* 41* 43*  CREATININE 0.93 0.80 0.95 0.92 0.55 0.49 0.32*  CALCIUM 7.2* 7.3* 7.7* 7.6* 7.6* 7.8* 7.6*  MG 1.8 1.7 2.1  --   --   --   --   PHOS NOT CALCULATED 3.5 3.8  --   --   --   --    Liver Function Tests:  Recent Labs Lab 01/22/17 0449 01/23/17 0321 01/24/17 0321 01/25/17 0515 01/27/17 0538  AST 240* 229* 206* 213* 233*  ALT 65* 79* 84* 96* 116*  ALKPHOS 252* 336* 291* 255* 235*  BILITOT 10.8* 9.3* 7.5* 7.8* 4.4*  PROT 3.8* 4.7* 4.8* 4.9* 4.4*  ALBUMIN 1.6* 1.9* 2.0* 2.0* 1.8*    Recent Labs Lab 01/26/17 0439  AMMONIA 53*   CBC:  Recent Labs Lab 01/23/17  1194 01/24/17 0321 01/25/17 0515 01/26/17 0439 01/27/17 0538  WBC 33.9* 21.4* 20.1* 18.1* 21.9*  HGB 10.4* 11.2* 11.6* 9.3* 10.2*  HCT 32.2* 33.1* 35.3 28.9* 32.2*  MCV 109.3* 109.6* 112.1* 112.7* 114.5*  PLT 79* 106* 128* 143* 156    CBG:  Recent Labs Lab 01/23/17 1645 01/23/17 2011 01/24/17 0402 01/24/17 0741 01/24/17 2010  GLUCAP 139* 147* 132* 121* 107*    Recent Results (from the past 240 hour(s))  MRSA PCR Screening     Status: None   Collection Time: 01/20/17 11:04 AM  Result Value Ref Range Status   MRSA by PCR NEGATIVE NEGATIVE Final    Comment:        The GeneXpert MRSA Assay (FDA approved for NASAL specimens only), is one component of a comprehensive MRSA colonization surveillance program. It is not intended to diagnose MRSA infection nor to guide or monitor treatment for MRSA infections.   CULTURE, BLOOD (ROUTINE X 2) w Reflex to ID Panel      Status: None (Preliminary result)   Collection Time: 01/26/17  4:08 PM  Result Value Ref Range Status   Specimen Description BLOOD BLOOD LEFT WRIST  Final   Special Requests   Final    BOTTLES DRAWN AEROBIC AND ANAEROBIC Blood Culture adequate volume   Culture NO GROWTH < 24 HOURS  Final   Report Status PENDING  Incomplete  CULTURE, BLOOD (ROUTINE X 2) w Reflex to ID Panel     Status: None (Preliminary result)   Collection Time: 01/26/17  4:08 PM  Result Value Ref Range Status   Specimen Description BLOOD BLOOD RIGHT WRIST  Final   Special Requests   Final    BOTTLES DRAWN AEROBIC AND ANAEROBIC Blood Culture results may not be optimal due to an inadequate volume of blood received in culture bottles   Culture NO GROWTH < 24 HOURS  Final   Report Status PENDING  Incomplete     Studies: Ct Head Wo Contrast  Result Date: 01/27/2017 CLINICAL DATA:  Increased confusion, extensive facial bruising from prior fall-already imaged EXAM: CT HEAD WITHOUT CONTRAST TECHNIQUE: Contiguous axial images were obtained from the base of the skull through the vertex without intravenous contrast. COMPARISON:  None. FINDINGS: Brain: No intracranial hemorrhage. No parenchymal contusion. No midline shift or mass effect. Basilar cisterns are patent. No skull base fracture. No fluid in the paranasal sinuses or mastoid air cells. Orbits are normal. There are periventricular and subcortical white matter hypodensities. Generalized cortical atrophy. Vascular: No hyperdense vessel or unexpected calcification. Skull: Normal. Negative for fracture or focal lesion. Sinuses/Orbits: Paranasal sinuses and mastoid air cells are clear. Orbits are clear. Other: Small midline frontal scalp hematoma. IMPRESSION: 1. No intracranial trauma. 2. Small frontal scalp hematoma. 3. Atrophy and white matter microvascular disease per Electronically Signed   By: Suzy Bouchard M.D.   On: 01/27/2017 15:17   Dg Chest Port 1 View  Result Date:  01/26/2017 CLINICAL DATA:  Acute respiratory failure EXAM: PORTABLE CHEST 1 VIEW COMPARISON:  01/23/2017 FINDINGS: A small bore feeding tube is noted entering the stomach with tip off the field of view. A right IJ central venous catheter has been removed. Increased hazy opacity overlying the lower right hemithorax probably represents layering effusion and atelectasis. Left lower lung consolidation/atelectasis and effusion again noted. There is no evidence of pneumothorax. IMPRESSION: Increased opacity overlying the lower right hemithorax likely representing layering effusion and atelectasis. Unchanged left lower lung consolidation/ atelectasis and small left effusion. Right  IJ central venous catheter removed. Electronically Signed   By: Margarette Canada M.D.   On: 01/26/2017 10:08    Scheduled Meds: . acetylcysteine  600 mg Oral BID  . chlorhexidine gluconate (MEDLINE KIT)  15 mL Mouth Rinse BID  . cholecalciferol  2,000 Units Oral Daily  . Copper Gluconate  1 tablet Oral Daily  . enoxaparin (LOVENOX) injection  40 mg Subcutaneous Q24H  . feeding supplement (OSMOLITE 1.5 CAL)  1,000 mL Per Tube Q1500  . feeding supplement (PRO-STAT SUGAR FREE 64)  30 mL Per Tube BID  . folic acid  1 mg Per Tube Daily  . free water  200 mL Per Tube Q4H  . furosemide  20 mg Intravenous Q12H  . ipratropium-albuterol  3 mL Nebulization Q6H  . multivitamin  15 mL Per Tube Daily  . phytonadione  10 mg Per Tube Daily  . rifaximin  550 mg Per Tube BID  . sodium chloride flush  3 mL Intravenous Q12H  . thiamine  100 mg Per Tube Daily  . vitamin B-12  1,000 mcg Per Tube Daily  . zinc sulfate  220 mg Per Tube Daily   Continuous Infusions: . fluconazole (DIFLUCAN) IV Stopped (01/27/17 1415)    Assessment/Plan:  1. Septic shock with Escherichia coli.  status post treatment with Cipro.white blood cell continues to be elevated , repeat blood cultures so far negative however pro-calcitonin is elevated I will start patient  on broad-spectrum antibioticsI will have ID see the patient 2. Acute respiratory failure with hypoxia. Patient extubated 01/22/2017. Oxygen requirement less today 3. Dysphasia with's sore throat patient at high risk of candidiasis with fungal infection I will start patient on fluconazole 4. Acute encephalopathy secondary to severe sepsis.  continue Xifaxan mental status still not better, we will obtain a CT scan of the head 5. Pancytopenia could be secondary to alcohol abuse and/or sepsis. Platelet count recovered 6. Hypomagnesemia, hypocalcemia and hypokalemia. Potassium replacement 7. Severe malnutrition, Anasarca and third spacing of fluids.  Tube feeding resumed. Patient also placed on diet. 8. Jaundice likely secondary to shock. Could also be secondary to prior alcohol abuse.   9. Facial trauma at home. Fractured nasal bones. Hematoma and bruising around the eyelids. 10. Anasarca start low dose lasix 11. Physical therapy evaluation   Code Status:     Code Status Orders        Start     Ordered   01/15/17 0158  Full code  Continuous     01/15/17 0157    Code Status History    Date Active Date Inactive Code Status Order ID Comments User Context   12/13/2015 11:49 AM 12/15/2015  7:28 PM Full Code [id number]  Dereck Leep, MD Inpatient   09/17/2014  6:01 AM 09/18/2014  3:51 PM Full Code [id number]  Juluis Mire, MD Inpatient     Family Communication: spoke with husband at the bedside Disposition Plan: potential candidate for LTAC  Consultants:  Critical care specialist  Antibiotics:  Cipro  Time spent: 106 minutes  [place], [facility]

## 2017-01-28 ENCOUNTER — Inpatient Hospital Stay: Payer: Medicare Other

## 2017-01-28 LAB — BASIC METABOLIC PANEL
Anion gap: 5 (ref 5–15)
BUN: 36 mg/dL — ABNORMAL HIGH (ref 6–20)
CALCIUM: 7.7 mg/dL — AB (ref 8.9–10.3)
CO2: 28 mmol/L (ref 22–32)
CREATININE: 0.57 mg/dL (ref 0.44–1.00)
Chloride: 113 mmol/L — ABNORMAL HIGH (ref 101–111)
Glucose, Bld: 125 mg/dL — ABNORMAL HIGH (ref 65–99)
Potassium: 3.1 mmol/L — ABNORMAL LOW (ref 3.5–5.1)
SODIUM: 146 mmol/L — AB (ref 135–145)

## 2017-01-28 LAB — CBC
HCT: 31.9 % — ABNORMAL LOW (ref 35.0–47.0)
Hemoglobin: 10 g/dL — ABNORMAL LOW (ref 12.0–16.0)
MCH: 36.9 pg — ABNORMAL HIGH (ref 26.0–34.0)
MCHC: 31.4 g/dL — AB (ref 32.0–36.0)
MCV: 117.6 fL — ABNORMAL HIGH (ref 80.0–100.0)
Platelets: 202 10*3/uL (ref 150–440)
RBC: 2.71 MIL/uL — ABNORMAL LOW (ref 3.80–5.20)
RDW: 20.2 % — AB (ref 11.5–14.5)
WBC: 17.4 10*3/uL — ABNORMAL HIGH (ref 3.6–11.0)

## 2017-01-28 MED ORDER — SODIUM CHLORIDE 0.9% FLUSH
10.0000 mL | Freq: Two times a day (BID) | INTRAVENOUS | Status: DC
  Administered 2017-01-28 – 2017-01-29 (×3): 10 mL
  Administered 2017-01-29: 20 mL
  Administered 2017-01-30 – 2017-01-31 (×4): 10 mL
  Administered 2017-02-01: 20 mL
  Administered 2017-02-01: 10 mL
  Administered 2017-02-02: 20 mL
  Administered 2017-02-02 – 2017-02-09 (×11): 10 mL

## 2017-01-28 MED ORDER — SODIUM CHLORIDE 0.9% FLUSH
10.0000 mL | INTRAVENOUS | Status: DC | PRN
  Administered 2017-01-28: 10 mL
  Filled 2017-01-28: qty 40

## 2017-01-28 MED ORDER — VANCOMYCIN HCL IN DEXTROSE 1-5 GM/200ML-% IV SOLN
1000.0000 mg | INTRAVENOUS | Status: DC
  Administered 2017-01-28 – 2017-01-29 (×3): 1000 mg via INTRAVENOUS
  Filled 2017-01-28 (×5): qty 200

## 2017-01-28 NOTE — Care Management (Signed)
Spoke with patient's husband regarding [facility]- [facility].  He would like to think about this care option and discuss with patient who is currently not available.   CM stressed that patient's  medicare uhc would have to approve this level of care.  [facility] has informed CM that patient has been screened and would meet the ltac criteria.  Patient's husband will inform CM 10.24 whether he would like to speak with representatives from [place] and or [facility].  Having a PICC line placed.  Patient has nasogastric tube with tube feedings  but is taking some nourishment  orally - full  liquids- nectar thick.  SLP was to have performed bedside swallowing evaluation due to esophageal dysmotility today but patient was not available.  ID consult is pending.

## 2017-01-28 NOTE — Progress Notes (Signed)
SLP Cancellation Note  Patient Details Name: Sonya Park MRN: [id number] DOB: 14-Nov-1940   Cancelled treatment:       Reason Eval/Treat Not Completed: Patient at procedure or test/unavailable (chart reviewed; NSG/MD consulted)  Per NSG report, pt and husband would like to try a more solid food diet. At this time, pt is on a full liquid diet w/ Nectar consistency liquids - w/ the full liquid texture in place d/t pt having an NG tube placed. Pt has significant esophageal dysmotility per husband report and chart notes; GI has followed pt per MD.   D/t concern for increased dysmotility and risk for regurgitation and aspiration of such, SLP consulted MD regarding option of removing NG prior to any solid food trials being given. MD agreed and stated that NG could be removed for ST treatment session w/ solid food trials tomorrow morning. NSG informed. ST services will f/u in AM.   Carolynn Sayers, SLP-Graduate Student Carolynn Sayers 01/28/2017, 2:37 PM   This information has been reviewed and agreed upon by this supervising clinician.  This patient note, response to treatment and overall treatment plan has been reviewed and this clinician agrees with the information provided.  01/28/17, 3:21 PM [place], [place], CCC-SLP

## 2017-01-28 NOTE — Progress Notes (Signed)
PT Cancellation Note  Patient Details Name: Sonya Park MRN: [id number] DOB: 1940-11-11   Cancelled Treatment:    Reason Eval/Treat Not Completed: Patient at procedure or test/unavailable   Attempted x 2 this am.  Will attempt again this pm as schedule allows.   Chesley Noon 01/28/2017, 11:06 AM

## 2017-01-28 NOTE — Progress Notes (Signed)
Peripherally Inserted Central Catheter/Midline Placement  The IV Nurse has discussed with the patient and/or persons authorized to consent for the patient, the purpose of this procedure and the potential benefits and risks involved with this procedure.  The benefits include less needle sticks, lab draws from the catheter, and the patient may be discharged home with the catheter. Risks include, but not limited to, infection, bleeding, blood clot (thrombus formation), and puncture of an artery; nerve damage and irregular heartbeat and possibility to perform a PICC exchange if needed/ordered by physician.  Alternatives to this procedure were also discussed.  B[employer] Power PICC patient education guide, fact sheet on infection prevention and patient information card has been provided to patient /or left at bedside.    PICC/Midline Placement Documentation     Consent obtained by husband at [place], [place] 01/28/2017, 3:04 PM

## 2017-01-28 NOTE — Progress Notes (Signed)
PT Cancellation Note  Patient Details Name: Sonya Park MRN: [id number] DOB: 1940/06/17   Cancelled Treatment:    Reason Eval/Treat Not Completed: Patient declined, no reason specified;Other (comment). Pt fatigued from earlier interventions and has just been placed on feeding via NG tube. Pt's family notes it was an "ordeal" to get pt up earlier. Pt/family wish pt to rest currently; declined exercises as well. Re attempt tomorrow. Per nursing, pt is having NG tube removed tomorrow a.m.   Larae Grooms, PTA 01/28/2017, 4:32 PM

## 2017-01-28 NOTE — Plan of Care (Signed)
Problem: Physical Regulation: Goal: Ability to maintain clinical measurements within normal limits will improve Outcome: Not Progressing Patient is very weak, no muscle tone, needs frequent muscle training.  Problem: Skin Integrity: Goal: Risk for impaired skin integrity will decrease Outcome: Not Progressing Provide skin care, wound care and q2h turning. Promote nutrition.

## 2017-01-28 NOTE — Progress Notes (Signed)
Patient ID: Sonya Park, female   DOB: 12-May-1940, 75 y.o.   MRN: [id number]   [facility] PROGRESS NOTE  Sonya Park MBW:[id number] DOB: 27-May-1940 DOA: 01/14/2017 PCP: Cletis Athens, MD  HPI/Subjective: That improvement today white blood cell count is decreased  Objective: Vitals:   01/28/17 0845 01/28/17 1308  BP: (!) 148/80   Pulse: (!) 101   Resp: 18   Temp: 97.6 F (36.4 C)   SpO2: 99% 98%    Filed Weights   01/26/17 0501 01/27/17 0310 01/28/17 0332  Weight: 144 lb (65.3 kg) 143 lb (64.9 kg) 142 lb (64.4 kg)    ROS: Review of Systems  Unable to perform ROS: Mental status change  Constitutional: Negative for fever.   Exam: Physical Exam  HENT:  Nose: No mucosal edema.  Mouth/Throat: No oropharyngeal exudate.  Eyes: Pupils are equal, round, and reactive to light. Lids are normal.  Icteric.   Neck: Carotid bruit is not present. No thyromegaly present.  Cardiovascular: Regular rhythm, S1 normal and S2 normal.   Respiratory: She has decreased breath sounds in the right lower field and the left lower field. She has no wheezes. She has no rhonchi. She has no rales.  GI: Soft. Bowel sounds are normal. There is no tenderness.  Musculoskeletal:       Right elbow: She exhibits swelling.       Left elbow: She exhibits swelling.       Right wrist: She exhibits swelling.       Left wrist: She exhibits tenderness.       Right knee: She exhibits swelling.       Left knee: She exhibits swelling.       Right ankle: She exhibits swelling.       Left ankle: She exhibits swelling.  Lymphadenopathy:    She has no cervical adenopathy.  Neurological: She is alert.  Patient barely able to straight leg raise. Patient able to lift arms up off the bed  Skin:  Large ulceration left posterior calf. Demarcation of the toes with a dusky look. Left third and fourth toe still brownish lookingBruising upper extremities and lower extremities.  Bruising around bilateral  eyelids. Jaundice.  Psychiatric: She has a normal mood and affect.      Data Reviewed: Basic Metabolic Panel:  Recent Labs Lab 01/22/17 0449 01/23/17 0321 01/24/17 0321 01/25/17 0515 01/26/17 0439 01/27/17 0538 01/28/17 0441  NA 137 138 147* 147* 145 149* 146*  K 3.9 4.0 3.3* 4.1 3.3* 4.4 3.1*  CL 107 106 113* 116* 113* 113* 113*  CO2 _0 GLUCOSE 117* 78 149* 122* 106* 109* 125*  BUN 43* 55* 56* 46* 41* 43* 36*  CREATININE 0.80 0.95 0.92 0.55 0.49 0.32* 0.57  CALCIUM 7.3* 7.7* 7.6* 7.6* 7.8* 7.6* 7.7*  MG 1.7 2.1  --   --   --   --   --   PHOS 3.5 3.8  --   --   --   --   --    Liver Function Tests:  Recent Labs Lab 01/22/17 0449 01/23/17 0321 01/24/17 0321 01/25/17 0515 01/27/17 0538  AST 240* 229* 206* 213* 233*  ALT 65* 79* 84* 96* 116*  ALKPHOS 252* 336* 291* 255* 235*  BILITOT 10.8* 9.3* 7.5* 7.8* 4.4*  PROT 3.8* 4.7* 4.8* 4.9* 4.4*  ALBUMIN 1.6* 1.9* 2.0* 2.0* 1.8*    Recent Labs Lab 01/26/17 0439  AMMONIA 53*   CBC:  Recent Labs Lab 01/24/17 0321 01/25/17 0515 01/26/17 0439 01/27/17 0538 01/28/17 0441  WBC 21.4* 20.1* 18.1* 21.9* 17.4*  HGB 11.2* 11.6* 9.3* 10.2* 10.0*  HCT 33.1* 35.3 28.9* 32.2* 31.9*  MCV 109.6* 112.1* 112.7* 114.5* 117.6*  PLT 106* 128* 143* 156 202    CBG:  Recent Labs Lab 01/23/17 1645 01/23/17 2011 01/24/17 0402 01/24/17 0741 01/24/17 2010  GLUCAP 139* 147* 132* 121* 107*    Recent Results (from the past 240 hour(s))  MRSA PCR Screening     Status: None   Collection Time: 01/20/17 11:04 AM  Result Value Ref Range Status   MRSA by PCR NEGATIVE NEGATIVE Final    Comment:        The GeneXpert MRSA Assay (FDA approved for NASAL specimens only), is one component of a comprehensive MRSA colonization surveillance program. It is not intended to diagnose MRSA infection nor to guide or monitor treatment for MRSA infections.   CULTURE, BLOOD (ROUTINE X 2) w Reflex to ID Panel      Status: None (Preliminary result)   Collection Time: 01/26/17  4:08 PM  Result Value Ref Range Status   Specimen Description BLOOD BLOOD LEFT WRIST  Final   Special Requests   Final    BOTTLES DRAWN AEROBIC AND ANAEROBIC Blood Culture adequate volume   Culture NO GROWTH 2 DAYS  Final   Report Status PENDING  Incomplete  CULTURE, BLOOD (ROUTINE X 2) w Reflex to ID Panel     Status: None (Preliminary result)   Collection Time: 01/26/17  4:08 PM  Result Value Ref Range Status   Specimen Description BLOOD BLOOD RIGHT WRIST  Final   Special Requests   Final    BOTTLES DRAWN AEROBIC AND ANAEROBIC Blood Culture results may not be optimal due to an inadequate volume of blood received in culture bottles   Culture NO GROWTH 2 DAYS  Final   Report Status PENDING  Incomplete     Studies: Ct Head Wo Contrast  Result Date: 01/27/2017 CLINICAL DATA:  Increased confusion, extensive facial bruising from prior fall-already imaged EXAM: CT HEAD WITHOUT CONTRAST TECHNIQUE: Contiguous axial images were obtained from the base of the skull through the vertex without intravenous contrast. COMPARISON:  None. FINDINGS: Brain: No intracranial hemorrhage. No parenchymal contusion. No midline shift or mass effect. Basilar cisterns are patent. No skull base fracture. No fluid in the paranasal sinuses or mastoid air cells. Orbits are normal. There are periventricular and subcortical white matter hypodensities. Generalized cortical atrophy. Vascular: No hyperdense vessel or unexpected calcification. Skull: Normal. Negative for fracture or focal lesion. Sinuses/Orbits: Paranasal sinuses and mastoid air cells are clear. Orbits are clear. Other: Small midline frontal scalp hematoma. IMPRESSION: 1. No intracranial trauma. 2. Small frontal scalp hematoma. 3. Atrophy and white matter microvascular disease per Electronically Signed   By: Suzy Bouchard M.D.   On: 01/27/2017 15:17   Dg Ugi W/high Density W/kub  Result Date:  01/28/2017 CLINICAL DATA:  History of gastric bypass.  Feeding tube. EXAM: UPPER GI SERIES WITH KUB TECHNIQUE: After obtaining a scout radiograph a routine upper GI series was performed using thin density FLUOROSCOPY TIME:  Fluoroscopy Time:  1 minutes 54 seconds Radiation Exposure Index (if provided by the fluoroscopic device): 15.6 Number of Acquired Spot Images: 17 COMPARISON:  Abdomen 01/22/2017. FINDINGS: Feeding tube noted with tip over the upper abdomen. The patient was not able to drink barium through a straw annulus head using a splint. The patient would only  drank a small amount of barium. The esophagus and gastric remnant are patent. Proximal small bowel is faintly visualized. Feeding tube tip most likely in an enteric loop. The patient would not tolerate any more imaging for further visualization of the gastroenteric anastomosis and proximal small bowel. IMPRESSION: Very limited exam as above. Esophagus and gastric remnant appear to be patent. Feeding tube noted with tip in enteric loop. Electronically Signed   By: Marcello Moores  Register   On: 01/28/2017 12:35    Scheduled Meds: . acetylcysteine  600 mg Oral BID  . chlorhexidine gluconate (MEDLINE KIT)  15 mL Mouth Rinse BID  . cholecalciferol  2,000 Units Oral Daily  . Copper Gluconate  1 tablet Oral Daily  . enoxaparin (LOVENOX) injection  40 mg Subcutaneous Q24H  . feeding supplement (OSMOLITE 1.5 CAL)  1,000 mL Per Tube Q1500  . feeding supplement (PRO-STAT SUGAR FREE 64)  30 mL Per Tube BID  . folic acid  1 mg Per Tube Daily  . free water  200 mL Per Tube Q4H  . furosemide  20 mg Intravenous Q12H  . ipratropium-albuterol  3 mL Nebulization Q6H  . multivitamin  15 mL Per Tube Daily  . phytonadione  10 mg Per Tube Daily  . rifaximin  550 mg Per Tube BID  . sodium chloride flush  10-40 mL Intracatheter Q12H  . sodium chloride flush  3 mL Intravenous Q12H  . thiamine  100 mg Per Tube Daily  . vitamin B-12  1,000 mcg Per Tube Daily  .  zinc sulfate  220 mg Per Tube Daily   Continuous Infusions: . fluconazole (DIFLUCAN) IV 200 mg (01/28/17 1627)  . meropenem (MERREM) IV Stopped (01/28/17 1640)  . vancomycin 1,000 mg (01/28/17 1538)    Assessment/Plan:  1. Septic shock with Escherichia coli.  status post treatment with Cipro.white blood cell continues to be elevated , repeat blood cultures so far negative however pro-calcitonin is elevated continued therapy with meropenem and vancomycin 2. Acute respiratory failure with hypoxia. Patient extubated 01/22/2017. Oxygen requirement less today 3. Dysphasia with's sore throat and upper GI series shows no evidence of obstruction continue fluconazole 4. Acute encephalopathy secondary to severe sepsis.  continue Xifaxan mental status still not better, we will obtain a CT scan of the head 5. Pancytopenia could be secondary to alcohol abuse and/or sepsis. Platelet count recovered 6. Hypomagnesemia, hypocalcemia and hypokalemia. Potassium replacement 7. Severe malnutrition, Anasarca and third spacing of fluids.  Continued tube feeds 8. Jaundice likely secondary to shock. Could also be secondary to prior alcohol abuse.   9. Facial trauma at home. Fractured nasal bones. Hematoma and bruising around the eyelids. 10. Anasarca continue low dose lasix 11. Physical therapy evaluation   Code Status:     Code Status Orders        Start     Ordered   01/15/17 0158  Full code  Continuous     01/15/17 0157    Code Status History    Date Active Date Inactive Code Status Order ID Comments User Context   12/13/2015 11:49 AM 12/15/2015  7:28 PM Full Code [id number]  Dereck Leep, MD Inpatient   09/17/2014  6:01 AM 09/18/2014  3:51 PM Full Code [id number]  Juluis Mire, MD Inpatient     Family Communication: spoke with husband at the bedside Disposition Plan: potential candidate for LTAC  Consultants:  Critical care specialist  Antibiotics:  Cipro  Time spent: 32  minutes  [place]agrace Botelho, [facility]  [facility]

## 2017-01-28 NOTE — Consult Note (Signed)
[facility] Infectious Disease     Reason for Consult: Leukocytosis   Referring Physician:  Serita Grit Date of Admission:  01/14/2017   Principal Problem:   Sepsis ([place]) Active Problems:   Facial trauma   Osteoporosis   Neutropenia ([place])   Alcohol abuse   Acute respiratory failure ([place])   Bacteremia   Palliative care by specialist   Goals of care, counseling/discussion   Pressure injury of skin   Hyperbilirubinemia   Alcoholic liver disease ([place])   Thrombocytopenia ([place])   Anasarca  HPI: Sonya Park is a 76 y.o. female admitted 10/9 with a fall and facial trauma. On admit wbc was 0.8 , no fevers but then spiked. Found to have E coli bacteremia on 10/9 cultures. Has been on abx since then but wbc persist however has decreased from 33 on 10/18 to 17.4 now.   Ct scan initially neg for infection but on 10/16 USS showed 1.4 cm gallstone and possible cholecystitis. LFTs were markedly elevated.  Currently has wounds on L leg, has NGT in for TF. Remains jaundiced, lethargic.   Past Medical History:  Diagnosis Date  . Arthritis   . Back pain   . Insomnia   . Medical history non-contributory   . Osteoporosis    Past Surgical History:  Procedure Laterality Date  . ABDOMINAL HYSTERECTOMY    . BREAST LUMPECTOMY Right   . COLONOSCOPY WITH PROPOFOL N/A 07/18/2015   Procedure: COLONOSCOPY WITH PROPOFOL;  Surgeon: [name], MD;  Location: [facility] ENDOSCOPY;  Service: Endoscopy;  Laterality: N/A;  . COSMETIC SURGERY    . GASTRIC BYPASS     X2  . HEMORROIDECTOMY    . KNEE ARTHROPLASTY Right 12/13/2015   Procedure: COMPUTER ASSISTED TOTAL KNEE ARTHROPLASTY;  Surgeon: Dereck Leep, MD;  Location: [facility] ORS;  Service: Orthopedics;  Laterality: Right;  . KNEE ARTHROSCOPY    . REPLACEMENT TOTAL KNEE Left    Social History  Substance Use Topics  . Smoking status: Former Smoker    Quit date: 11/30/1994  . Smokeless tobacco: Never Used  . Alcohol use 12.6 - 21.0 oz/week    21 - 35  Glasses of wine per week     Comment: 3-5 glasses of wine/ day   Family History  Problem Relation Age of Onset  . Liver disease Mother   . Heart attack Father   . Cancer Father   . CAD Sister     Allergies:  Allergies  Allergen Reactions  . Other Itching    "mycin" doesn't know which one  . Penicillins Swelling    Has patient had a PCN reaction causing immediate rash, facial/tongue/throat swelling, SOB or lightheadedness with hypotension: Yes Has patient had a PCN reaction causing severe rash involving mucus membranes or skin necrosis: No Has patient had a PCN reaction that required hospitalization: No Has patient had a PCN reaction occurring within the last 10 years: No If all of the above answers are "NO", then may proceed with Cephalosporin use.    Current antibiotics: Antibiotics Given (last 72 hours)    Date/Time Action Medication Dose Rate   01/25/17 2147 Given   rifaximin (XIFAXAN) tablet 550 mg 550 mg    01/26/17 0918 Given   rifaximin (XIFAXAN) tablet 550 mg 550 mg    01/26/17 2151 Given   rifaximin (XIFAXAN) tablet 550 mg 550 mg    01/27/17 0931 Given   rifaximin (XIFAXAN) tablet 550 mg 550 mg    01/27/17 1653 New Bag/Given  meropenem (MERREM) 1 g in sodium chloride 0.9 % 100 mL IVPB 1 g 200 mL/hr   01/27/17 1744 New Bag/Given   vancomycin (VANCOCIN) IVPB 1000 mg/200 mL premix 1,000 mg 200 mL/hr   01/27/17 2106 Given   rifaximin (XIFAXAN) tablet 550 mg 550 mg    01/27/17 2347 New Bag/Given   vancomycin (VANCOCIN) IVPB 750 mg/150 ml premix 750 mg 150 mL/hr   01/28/17 0100 New Bag/Given   meropenem (MERREM) 1 g in sodium chloride 0.9 % 100 mL IVPB 1 g 200 mL/hr   01/28/17 1010 New Bag/Given   meropenem (MERREM) 1 g in sodium chloride 0.9 % 100 mL IVPB 1 g 200 mL/hr   01/28/17 1011 Given   rifaximin (XIFAXAN) tablet 550 mg 550 mg       MEDICATIONS: . acetylcysteine  600 mg Oral BID  . chlorhexidine gluconate (MEDLINE KIT)  15 mL Mouth Rinse BID  .  cholecalciferol  2,000 Units Oral Daily  . Copper Gluconate  1 tablet Oral Daily  . enoxaparin (LOVENOX) injection  40 mg Subcutaneous Q24H  . feeding supplement (OSMOLITE 1.5 CAL)  1,000 mL Per Tube Q1500  . feeding supplement (PRO-STAT SUGAR FREE 64)  30 mL Per Tube BID  . folic acid  1 mg Per Tube Daily  . free water  200 mL Per Tube Q4H  . furosemide  20 mg Intravenous Q12H  . ipratropium-albuterol  3 mL Nebulization Q6H  . multivitamin  15 mL Per Tube Daily  . phytonadione  10 mg Per Tube Daily  . rifaximin  550 mg Per Tube BID  . sodium chloride flush  3 mL Intravenous Q12H  . thiamine  100 mg Per Tube Daily  . vitamin B-12  1,000 mcg Per Tube Daily  . zinc sulfate  220 mg Per Tube Daily    Review of Systems - unable to obtain   OBJECTIVE: Temp:  [97.6 F (36.4 C)-98.6 F (37 C)] 97.6 F (36.4 C) (10/23 0845) Pulse Rate:  [99-108] 101 (10/23 0845) Resp:  [17-18] 18 (10/23 0845) BP: (127-148)/(73-81) 148/80 (10/23 0845) SpO2:  [96 %-100 %] 99 % (10/23 0845) Weight:  [64.4 kg (142 lb)] 64.4 kg (142 lb) (10/23 0332) Physical Exam  Constitutional:  Thin, frail, ecchymosis over face  HENT: [place]/AT, PERRLA, + jaundiced Mouth/Throat: lips cracked and  dry . NGT in place  Cardiovascular: Normal rate, regular rhythm and normal heart sounds.  Pulmonary/Chest: poor air movement Neck = supple, no nuchal rigidity Abdominal: Soft. Bowel sounds are normal.  Obese, ? Grimaces in RUQ palp Lymphadenopathy: no cervical adenopathy. No axillary adenopathy Neurological: lethargic Ext anasarca Skin: L leg with multiple wounds, some necrotic appearing Psychiatric:lethargic  LABS: Results for orders placed or performed during the hospital encounter of 01/14/17 (from the past 48 hour(s))  CULTURE, BLOOD (ROUTINE X 2) w Reflex to ID Panel     Status: None (Preliminary result)   Collection Time: 01/26/17  4:08 PM  Result Value Ref Range   Specimen Description BLOOD BLOOD LEFT WRIST     Special Requests      BOTTLES DRAWN AEROBIC AND ANAEROBIC Blood Culture adequate volume   Culture NO GROWTH 2 DAYS    Report Status PENDING   CULTURE, BLOOD (ROUTINE X 2) w Reflex to ID Panel     Status: None (Preliminary result)   Collection Time: 01/26/17  4:08 PM  Result Value Ref Range   Specimen Description BLOOD BLOOD RIGHT WRIST    Special Requests  BOTTLES DRAWN AEROBIC AND ANAEROBIC Blood Culture results may not be optimal due to an inadequate volume of blood received in culture bottles   Culture NO GROWTH 2 DAYS    Report Status PENDING   CBC     Status: Abnormal   Collection Time: 01/27/17  5:38 AM  Result Value Ref Range   WBC 21.9 (H) 3.6 - 11.0 K/uL   RBC 2.81 (L) 3.80 - 5.20 MIL/uL   Hemoglobin 10.2 (L) 12.0 - 16.0 g/dL   HCT 32.2 (L) 35.0 - 47.0 %   MCV 114.5 (H) 80.0 - 100.0 fL   MCH 36.4 (H) 26.0 - 34.0 pg   MCHC 31.8 (L) 32.0 - 36.0 g/dL   RDW 19.0 (H) 11.5 - 14.5 %   Platelets 156 150 - 440 K/uL  Comprehensive metabolic panel     Status: Abnormal   Collection Time: 01/27/17  5:38 AM  Result Value Ref Range   Sodium 149 (H) 135 - 145 mmol/L    Comment: ELECTROLYTES REPEATED.PMH   Potassium 4.4 3.5 - 5.1 mmol/L    Comment: HEMOLYSIS AT THIS LEVEL MAY AFFECT RESULT   Chloride 113 (H) 101 - 111 mmol/L   CO2 25 22 - 32 mmol/L   Glucose, Bld 109 (H) 65 - 99 mg/dL   BUN 43 (H) 6 - 20 mg/dL   Creatinine, Ser 0.32 (L) 0.44 - 1.00 mg/dL   Calcium 7.6 (L) 8.9 - 10.3 mg/dL   Total Protein 4.4 (L) 6.5 - 8.1 g/dL   Albumin 1.8 (L) 3.5 - 5.0 g/dL   AST 233 (H) 15 - 41 U/L    Comment: HEMOLYSIS AT THIS LEVEL MAY AFFECT RESULT   ALT 116 (H) 14 - 54 U/L    Comment: HEMOLYSIS AT THIS LEVEL MAY AFFECT RESULT   Alkaline Phosphatase 235 (H) 38 - 126 U/L   Total Bilirubin 4.4 (H) 0.3 - 1.2 mg/dL    Comment: HEMOLYSIS AT THIS LEVEL MAY AFFECT RESULT   GFR calc non Af Amer >60 >60 mL/min   GFR calc Af Amer >60 >60 mL/min    Comment: (NOTE) The eGFR has been calculated  using the CKD EPI equation. This calculation has not been validated in all clinical situations. eGFR's persistently <60 mL/min signify possible Chronic Kidney Disease.    Anion gap 11 5 - 15  Procalcitonin     Status: None   Collection Time: 01/27/17  2:31 PM  Result Value Ref Range   Procalcitonin 0.44 ng/mL    Comment:        Interpretation: PCT (Procalcitonin) <= 0.5 ng/mL: Systemic infection (sepsis) is not likely. Local bacterial infection is possible. (NOTE)         ICU PCT Algorithm               Non ICU PCT Algorithm    ----------------------------     ------------------------------         PCT < 0.25 ng/mL                 PCT < 0.1 ng/mL     Stopping of antibiotics            Stopping of antibiotics       strongly encouraged.               strongly encouraged.    ----------------------------     ------------------------------       PCT level decrease by  PCT < 0.25 ng/mL       >= 80% from peak PCT       OR PCT 0.25 - 0.5 ng/mL          Stopping of antibiotics                                             encouraged.     Stopping of antibiotics           encouraged.    ----------------------------     ------------------------------       PCT level decrease by              PCT >= 0.25 ng/mL       < 80% from peak PCT        AND PCT >= 0.5 ng/mL            Continuin g antibiotics                                              encouraged.       Continuing antibiotics            encouraged.    ----------------------------     ------------------------------     PCT level increase compared          PCT > 0.5 ng/mL         with peak PCT AND          PCT >= 0.5 ng/mL             Escalation of antibiotics                                          strongly encouraged.      Escalation of antibiotics        strongly encouraged.   CBC     Status: Abnormal   Collection Time: 01/28/17  4:41 AM  Result Value Ref Range   WBC 17.4 (H) 3.6 - 11.0 K/uL   RBC 2.71 (L) 3.80 -  5.20 MIL/uL   Hemoglobin 10.0 (L) 12.0 - 16.0 g/dL   HCT 31.9 (L) 35.0 - 47.0 %   MCV 117.6 (H) 80.0 - 100.0 fL   MCH 36.9 (H) 26.0 - 34.0 pg   MCHC 31.4 (L) 32.0 - 36.0 g/dL   RDW 20.2 (H) 11.5 - 14.5 %   Platelets 202 150 - 440 K/uL  Basic metabolic panel     Status: Abnormal   Collection Time: 01/28/17  4:41 AM  Result Value Ref Range   Sodium 146 (H) 135 - 145 mmol/L   Potassium 3.1 (L) 3.5 - 5.1 mmol/L   Chloride 113 (H) 101 - 111 mmol/L   CO2 28 22 - 32 mmol/L   Glucose, Bld 125 (H) 65 - 99 mg/dL   BUN 36 (H) 6 - 20 mg/dL   Creatinine, Ser 0.57 0.44 - 1.00 mg/dL   Calcium 7.7 (L) 8.9 - 10.3 mg/dL   GFR calc non Af Amer >60 >60 mL/min   GFR calc Af Amer >60 >60 mL/min    Comment: (NOTE) The eGFR has been calculated using the  CKD EPI equation. This calculation has not been validated in all clinical situations. eGFR's persistently <60 mL/min signify possible Chronic Kidney Disease.    Anion gap 5 5 - 15   No components found for: ESR, C REACTIVE PROTEIN MICRO: Recent Results (from the past 720 hour(s))  Blood Culture (routine x 2)     Status: Abnormal   Collection Time: 01/14/17 11:08 PM  Result Value Ref Range Status   Specimen Description BLOOD LFOA  Final   Special Requests   Final    BOTTLES DRAWN AEROBIC AND ANAEROBIC Blood Culture results may not be optimal due to an excessive volume of blood received in culture bottles   Culture  Setup Time   Final    GRAM NEGATIVE RODS IN BOTH AEROBIC AND ANAEROBIC BOTTLES CRITICAL RESULT CALLED TO, READ BACK BY AND VERIFIED WITH:  MICHAEL SIMPSON AT 1207 01/15/17 SDR    Culture ESCHERICHIA COLI (A)  Final   Report Status 01/17/2017 FINAL  Final   Organism ID, Bacteria ESCHERICHIA COLI  Final      Susceptibility   Escherichia coli - MIC*    AMPICILLIN >=32 RESISTANT Resistant     CEFAZOLIN <=4 SENSITIVE Sensitive     CEFEPIME <=1 SENSITIVE Sensitive     CEFTAZIDIME <=1 SENSITIVE Sensitive     CEFTRIAXONE <=1 SENSITIVE  Sensitive     CIPROFLOXACIN 1 SENSITIVE Sensitive     GENTAMICIN <=1 SENSITIVE Sensitive     IMIPENEM <=0.25 SENSITIVE Sensitive     TRIMETH/SULFA >=320 RESISTANT Resistant     AMPICILLIN/SULBACTAM >=32 RESISTANT Resistant     PIP/TAZO <=4 SENSITIVE Sensitive     Extended ESBL NEGATIVE Sensitive     * ESCHERICHIA COLI  Blood Culture (routine x 2)     Status: Abnormal   Collection Time: 01/14/17 11:08 PM  Result Value Ref Range Status   Specimen Description BLOOD LAC  Final   Special Requests   Final    BOTTLES DRAWN AEROBIC AND ANAEROBIC Blood Culture results may not be optimal due to an excessive volume of blood received in culture bottles   Culture  Setup Time   Final    GRAM NEGATIVE RODS IN BOTH AEROBIC AND ANAEROBIC BOTTLES CRITICAL VALUE NOTED.  VALUE IS CONSISTENT WITH PREVIOUSLY REPORTED AND CALLED VALUE.    Culture (A)  Final    ESCHERICHIA COLI SUSCEPTIBILITIES PERFORMED ON PREVIOUS CULTURE WITHIN THE LAST 5 DAYS. Performed at [facility], [place] [address], [place], [place] [address]    Report Status 01/17/2017 FINAL  Final  Urine culture     Status: Abnormal   Collection Time: 01/14/17 11:08 PM  Result Value Ref Range Status   Specimen Description URINE, RANDOM  Final   Special Requests NONE  Final   Culture (A)  Final    <10,000 COLONIES/mL INSIGNIFICANT GROWTH Performed at [facility], [place] [address], [place], [place] [address]    Report Status 01/16/2017 FINAL  Final  Blood Culture ID Panel (Reflexed)     Status: Abnormal   Collection Time: 01/14/17 11:08 PM  Result Value Ref Range Status   Enterococcus species NOT DETECTED NOT DETECTED Final   Listeria monocytogenes NOT DETECTED NOT DETECTED Final   Staphylococcus species NOT DETECTED NOT DETECTED Final   Staphylococcus aureus NOT DETECTED NOT DETECTED Final   Streptococcus species NOT DETECTED NOT DETECTED Final   Streptococcus agalactiae NOT DETECTED NOT DETECTED Final   Streptococcus  pneumoniae NOT DETECTED NOT DETECTED Final  Streptococcus pyogenes NOT DETECTED NOT DETECTED Final   Acinetobacter baumannii NOT DETECTED NOT DETECTED Final   Enterobacteriaceae species DETECTED (A) NOT DETECTED Final    Comment: Enterobacteriaceae represent a large family of gram-negative bacteria, not a single organism. CRITICAL RESULT CALLED TO, READ BACK BY AND VERIFIED WITH:  MICHAEL SIMPSON AT 1207 01/15/17 SDR    Enterobacter cloacae complex NOT DETECTED NOT DETECTED Final   Escherichia coli DETECTED (A) NOT DETECTED Final    Comment: CRITICAL RESULT CALLED TO, READ BACK BY AND VERIFIED WITH:  MICHAEL SIMPSON AT 1207 01/15/17 SDR    Klebsiella oxytoca NOT DETECTED NOT DETECTED Final   Klebsiella pneumoniae NOT DETECTED NOT DETECTED Final   Proteus species NOT DETECTED NOT DETECTED Final   Serratia marcescens NOT DETECTED NOT DETECTED Final   Carbapenem resistance NOT DETECTED NOT DETECTED Final   Haemophilus influenzae NOT DETECTED NOT DETECTED Final   Neisseria meningitidis NOT DETECTED NOT DETECTED Final   Pseudomonas aeruginosa NOT DETECTED NOT DETECTED Final   Candida albicans NOT DETECTED NOT DETECTED Final   Candida glabrata NOT DETECTED NOT DETECTED Final   Candida krusei NOT DETECTED NOT DETECTED Final   Candida parapsilosis NOT DETECTED NOT DETECTED Final   Candida tropicalis NOT DETECTED NOT DETECTED Final  MRSA PCR Screening     Status: None   Collection Time: 01/20/17 11:04 AM  Result Value Ref Range Status   MRSA by PCR NEGATIVE NEGATIVE Final    Comment:        The GeneXpert MRSA Assay (FDA approved for NASAL specimens only), is one component of a comprehensive MRSA colonization surveillance program. It is not intended to diagnose MRSA infection nor to guide or monitor treatment for MRSA infections.   CULTURE, BLOOD (ROUTINE X 2) w Reflex to ID Panel     Status: None (Preliminary result)   Collection Time: 01/26/17  4:08 PM  Result Value Ref Range  Status   Specimen Description BLOOD BLOOD LEFT WRIST  Final   Special Requests   Final    BOTTLES DRAWN AEROBIC AND ANAEROBIC Blood Culture adequate volume   Culture NO GROWTH 2 DAYS  Final   Report Status PENDING  Incomplete  CULTURE, BLOOD (ROUTINE X 2) w Reflex to ID Panel     Status: None (Preliminary result)   Collection Time: 01/26/17  4:08 PM  Result Value Ref Range Status   Specimen Description BLOOD BLOOD RIGHT WRIST  Final   Special Requests   Final    BOTTLES DRAWN AEROBIC AND ANAEROBIC Blood Culture results may not be optimal due to an inadequate volume of blood received in culture bottles   Culture NO GROWTH 2 DAYS  Final   Report Status PENDING  Incomplete    IMAGING: Dg Abd 1 View  Result Date: 01/22/2017 CLINICAL DATA:  Feeding tube placement. EXAM: ABDOMEN - 1 VIEW COMPARISON:  Supine abdominal radiograph of January 17, 2017 FINDINGS: A he radiodense tipped feeding tube is present. The tip of the tube appears to lie in the proximal duodenum. IMPRESSION: The feeding tube tip overlies the proximal duodenum. Electronically Signed   By: Oswin Johal  [place] M.D.   On: 01/22/2017 12:00   Dg Abd 1 View  Result Date: 01/17/2017 CLINICAL DATA:  Placement of orogastric tube EXAM: ABDOMEN - 1 VIEW COMPARISON:  Radiographs 10//10/18 FINDINGS: NG tube extends the stomach.  Side port below the GE junction. IMPRESSION: NG tube in stomach.  Side port below the GE junction Electronically Signed   By:  Suzy Bouchard M.D.   On: 01/17/2017 21:25   Ct Head Wo Contrast  Result Date: 01/27/2017 CLINICAL DATA:  Increased confusion, extensive facial bruising from prior fall-already imaged EXAM: CT HEAD WITHOUT CONTRAST TECHNIQUE: Contiguous axial images were obtained from the base of the skull through the vertex without intravenous contrast. COMPARISON:  None. FINDINGS: Brain: No intracranial hemorrhage. No parenchymal contusion. No midline shift or mass effect. Basilar cisterns are patent. No  skull base fracture. No fluid in the paranasal sinuses or mastoid air cells. Orbits are normal. There are periventricular and subcortical white matter hypodensities. Generalized cortical atrophy. Vascular: No hyperdense vessel or unexpected calcification. Skull: Normal. Negative for fracture or focal lesion. Sinuses/Orbits: Paranasal sinuses and mastoid air cells are clear. Orbits are clear. Other: Small midline frontal scalp hematoma. IMPRESSION: 1. No intracranial trauma. 2. Small frontal scalp hematoma. 3. Atrophy and white matter microvascular disease per Electronically Signed   By: Suzy Bouchard M.D.   On: 01/27/2017 15:17   Ct Head Wo Contrast  Result Date: 01/14/2017 CLINICAL DATA:  Fall.  Trauma to front of head. EXAM: CT HEAD WITHOUT CONTRAST TECHNIQUE: Contiguous axial images were obtained from the base of the skull through the vertex without intravenous contrast. COMPARISON:  08/01/2015 FINDINGS: Brain: Expected cerebral volume loss for age. Mild low density in the periventricular white matter likely related to small vessel disease. No mass lesion, hemorrhage, hydrocephalus, acute infarct, intra-axial, or extra-axial fluid collection. Vascular: Intracranial atherosclerosis. Skull: Soft tissue swelling superficial to the superior aspect of with nose and about the frontal sinuses. This extends minimally into the right frontal scalp. No skull fracture. Sinuses/Orbits: Normal imaged portions of the orbits and globes. There is osseous irregularity involving both nasal bones, felt to be relatively similar to 10/16/2012. Partial ethmoid air cell opacification. hyperattenuating material in the left frontal sinus is new. Clear mastoid air cells. Other: None. IMPRESSION: 1. Periorbital and frontal scalp soft tissue thickening, without acute intracranial abnormality. 2. New hyperattenuating material in the left frontal sinus could represent hemorrhage. No acute fracture is seen. If this is a clinical concern,  consider dedicated face CT. 3. Osseous irregularity about both nasal bones is felt to be grossly similar back to 10/16/2012. Correlate with clinical exam to exclude acute superimposed injury. 4.  Cerebral atrophy and small vessel ischemic change. Electronically Signed   By: Abigail Miyamoto M.D.   On: 01/14/2017 13:30   Ct Chest W Contrast  Result Date: 01/14/2017 CLINICAL DATA:  Fall. EXAM: CT CHEST, ABDOMEN, AND PELVIS WITH CONTRAST TECHNIQUE: Multidetector CT imaging of the chest, abdomen and pelvis was performed following the standard protocol during bolus administration of intravenous contrast. CONTRAST:  174m ISOVUE-300 IOPAMIDOL (ISOVUE-300) INJECTION 61% COMPARISON:  01/17/2011 FINDINGS: CT CHEST FINDINGS Cardiovascular: Aortic atherosclerosis. Calcification in the LAD coronary artery noted. Normal heart size. No pericardial effusion. Mediastinum/Nodes: No enlarged mediastinal, hilar, or axillary lymph nodes. Thyroid gland, trachea, and esophagus demonstrate no significant findings. Lungs/Pleura: No pleural effusion identified. No pulmonary contusion or pneumothorax. 4 mm right middle lobe pulmonary nodule identified, image 85 of series 4. Left lower lobe nodule measures 3 mm, image 104 of series 4. Musculoskeletal: Spondylosis noted within the thoracic spine. Inferior endplate deformity involving the T12 vertebra is age indeterminate. No displaced rib fractures identified. CT ABDOMEN PELVIS FINDINGS Hepatobiliary: Marked diffuse hepatic steatosis. No focal liver abnormality. The gallbladder appears normal. No biliary dilatation. Pancreas: Unremarkable. No pancreatic ductal dilatation or surrounding inflammatory changes. Spleen: Spleen is unremarkable. Adrenals/Urinary Tract: No  adrenal hemorrhage or renal injury identified. Bladder is unremarkable. 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 No pathologic dilatation of the large or small bowel loops. Vascular/Lymphatic: Aortic  atherosclerosis. No aneurysm. No upper abdominal adenopathy. No pelvic or inguinal adenopathy. Reproductive: Status post hysterectomy. No adnexal masses. Other: No scratch set trace free fluid within the pelvis. No focal fluid collections. Musculoskeletal: Healed fracture deformity involving the right inferior pubic rami noted. No acute pelvic fracture noted. Chronic progressive superior endplate deformity involving the L3 vertebra. IMPRESSION: 1. Age-indeterminate T12 inferior endplate compression deformity. Progressive chronic inferior endplate deformity involves the L3 vertebra. 2. No evidence for solid organ or hollow viscus injury. 3. Small pulmonary nodules measure up to 4 mm. Nonspecific. No follow-up needed if patient is low-risk (and has no known or suspected primary neoplasm). Non-contrast chest CT can be considered in 12 months if patient is high-risk. This recommendation follows the consensus statement: Guidelines for Management of Incidental Pulmonary Nodules Detected on CT Images: From the Fleischner Society 2017; Radiology 2017; 284:228-243. 4. Aortic Atherosclerosis (ICD10-I70.0). Lad coronary artery calcification noted. 5. Hepatic steatosis. Electronically Signed   By: Kerby Moors M.D.   On: 01/14/2017 21:54   Ct Abdomen Pelvis W Contrast  Result Date: 01/14/2017 CLINICAL DATA:  Fall. EXAM: CT CHEST, ABDOMEN, AND PELVIS WITH CONTRAST TECHNIQUE: Multidetector CT imaging of the chest, abdomen and pelvis was performed following the standard protocol during bolus administration of intravenous contrast. CONTRAST:  182m ISOVUE-300 IOPAMIDOL (ISOVUE-300) INJECTION 61% COMPARISON:  01/17/2011 FINDINGS: CT CHEST FINDINGS Cardiovascular: Aortic atherosclerosis. Calcification in the LAD coronary artery noted. Normal heart size. No pericardial effusion. Mediastinum/Nodes: No enlarged mediastinal, hilar, or axillary lymph nodes. Thyroid gland, trachea, and esophagus demonstrate no significant findings.  Lungs/Pleura: No pleural effusion identified. No pulmonary contusion or pneumothorax. 4 mm right middle lobe pulmonary nodule identified, image 85 of series 4. Left lower lobe nodule measures 3 mm, image 104 of series 4. Musculoskeletal: Spondylosis noted within the thoracic spine. Inferior endplate deformity involving the T12 vertebra is age indeterminate. No displaced rib fractures identified. CT ABDOMEN PELVIS FINDINGS Hepatobiliary: Marked diffuse hepatic steatosis. No focal liver abnormality. The gallbladder appears normal. No biliary dilatation. Pancreas: Unremarkable. No pancreatic ductal dilatation or surrounding inflammatory changes. Spleen: Spleen is unremarkable. Adrenals/Urinary Tract: No adrenal hemorrhage or renal injury identified. Bladder is unremarkable. 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 No pathologic dilatation of the large or small bowel loops. Vascular/Lymphatic: Aortic atherosclerosis. No aneurysm. No upper abdominal adenopathy. No pelvic or inguinal adenopathy. Reproductive: Status post hysterectomy. No adnexal masses. Other: No scratch set trace free fluid within the pelvis. No focal fluid collections. Musculoskeletal: Healed fracture deformity involving the right inferior pubic rami noted. No acute pelvic fracture noted. Chronic progressive superior endplate deformity involving the L3 vertebra. IMPRESSION: 1. Age-indeterminate T12 inferior endplate compression deformity. Progressive chronic inferior endplate deformity involves the L3 vertebra. 2. No evidence for solid organ or hollow viscus injury. 3. Small pulmonary nodules measure up to 4 mm. Nonspecific. No follow-up needed if patient is low-risk (and has no known or suspected primary neoplasm). Non-contrast chest CT can be considered in 12 months if patient is high-risk. This recommendation follows the consensus statement: Guidelines for Management of Incidental Pulmonary Nodules Detected on CT Images:  From the Fleischner Society 2017; Radiology 2017; 284:228-243. 4. Aortic Atherosclerosis (ICD10-I70.0). Lad coronary artery calcification noted. 5. Hepatic steatosis. Electronically Signed   By: TKerby MoorsM.D.   On: 01/14/2017 21:54   [place]  Venous Img Lower Unilateral Left  Result Date: 01/15/2017 CLINICAL DATA:  Lower extremity edema EXAM: LEFT LOWER EXTREMITY VENOUS DOPPLER ULTRASOUND TECHNIQUE: Gray-scale sonography with graded compression, as well as color Doppler and duplex ultrasound were performed to evaluate the lower extremity deep venous systems from the level of the common femoral vein and including the common femoral, femoral, profunda femoral, popliteal and calf veins including the posterior tibial, peroneal and gastrocnemius veins when visible. The superficial great saphenous vein was also interrogated. Spectral Doppler was utilized to evaluate flow at rest and with distal augmentation maneuvers in the common femoral, femoral and popliteal veins. COMPARISON:  11/02/2009 FINDINGS: Contralateral Common Femoral Vein: Respiratory phasicity is normal and symmetric with the symptomatic side. No evidence of thrombus. Normal compressibility. Common Femoral Vein: No evidence of thrombus. Normal compressibility, respiratory phasicity and response to augmentation. Saphenofemoral Junction: No evidence of thrombus. Normal compressibility and flow on color Doppler imaging. Profunda Femoral Vein: No evidence of thrombus. Normal compressibility and flow on color Doppler imaging. Femoral Vein: No evidence of thrombus. Normal compressibility, respiratory phasicity and response to augmentation. Popliteal Vein: No evidence of thrombus. Normal compressibility, respiratory phasicity and response to augmentation. Calf Veins: No evidence of thrombus. Normal compressibility and flow on color Doppler imaging. Other Findings:  None. IMPRESSION: No evidence of DVT within the left lower extremity. Electronically Signed    By: Misty Stanley M.D.   On: 01/15/2017 09:32   Dg Chest Port 1 View  Result Date: 01/26/2017 CLINICAL DATA:  Acute respiratory failure EXAM: PORTABLE CHEST 1 VIEW COMPARISON:  01/23/2017 FINDINGS: A small bore feeding tube is noted entering the stomach with tip off the field of view. A right IJ central venous catheter has been removed. Increased hazy opacity overlying the lower right hemithorax probably represents layering effusion and atelectasis. Left lower lung consolidation/atelectasis and effusion again noted. There is no evidence of pneumothorax. IMPRESSION: Increased opacity overlying the lower right hemithorax likely representing layering effusion and atelectasis. Unchanged left lower lung consolidation/ atelectasis and small left effusion. Right IJ central venous catheter removed. Electronically Signed   By: Margarette Canada M.D.   On: 01/26/2017 10:08   Dg Chest Port 1 View  Result Date: 01/23/2017 CLINICAL DATA:  Respiratory failure. EXAM: PORTABLE CHEST 1 VIEW COMPARISON:  01/22/2017. FINDINGS: Interim removal of endotracheal tube and NG tube. Right IJ line in stable position. Interim slight clearing of bibasilar atelectasis and improvement of left pleural effusion. No pneumothorax. IMPRESSION: 1. An removal of endotracheal tube and NG tube. Right IJ line stable position. 2. Interim slight clearing of bibasilar atelectasis and left pleural effusion. Electronically Signed   By: Marcello Moores  Register   On: 01/23/2017 06:35   Dg Chest Port 1 View  Result Date: 01/22/2017 CLINICAL DATA:  Hyperbilirubinemia. EXAM: PORTABLE CHEST 1 VIEW COMPARISON:  Radiograph January 20, 2017. FINDINGS: Stable cardiomediastinal silhouette. Atherosclerosis of thoracic aorta is noted. Endotracheal and nasogastric tubes are unchanged in position. Right internal jugular catheter is unchanged with distal tip in expected position of the SVC. No pneumothorax is noted. Stable bilateral pleural effusions are noted, left greater  than right, with probable associated atelectasis. Bony thorax is unremarkable. IMPRESSION: Aortic atherosclerosis. Stable support apparatus. Stable bilateral pleural effusions are noted with probable associated atelectasis. Electronically Signed   By: Marijo Conception, M.D.   On: 01/22/2017 07:42   Dg Chest Port 1 View  Result Date: 01/20/2017 CLINICAL DATA:  Respiratory failure, intubated patient, sepsis, former smoker. EXAM: PORTABLE CHEST 1 VIEW  COMPARISON:  Chest x-ray of January 16, 2017 FINDINGS: The lungs are well-expanded. Pleural effusions layer posteriorly, bilaterally. The retrocardiac region remains dense on the left. The heart is top-normal in size. The pulmonary vascularity is normal. There is calcification in the wall of the aortic arch. The endotracheal tube tip lies 3.1 cm above the carina. The esophagogastric tube and right internal jugular venous catheter are in reasonable position. The observed bony thorax exhibits no acute abnormality. IMPRESSION: Interval worsening in the appearance of the chest with moderate-sized bilateral pleural effusions layering posteriorly. Persistent left lower lobe atelectasis or pneumonia. No overt pulmonary edema. Thoracic aortic atherosclerosis. Electronically Signed   By: Katara Griner  [place] M.D.   On: 01/20/2017 10:22   Dg Chest Port 1 View  Result Date: 01/16/2017 CLINICAL DATA:  Respiratory failure. EXAM: PORTABLE CHEST 1 VIEW COMPARISON:  01/15/2017. FINDINGS: Endotracheal tube, NG tube, right IJ line stable position. Heart size stable. Persistent atelectasis/ consolidation left lower lobe. Persistent small left pleural effusion. No pneumothorax. Chest is unchanged. IMPRESSION: 1. Lines and tubes in stable position. 2. Persistent left lower lobe atelectasis and consolidation and small left pleural effusion. No interim change from prior exam. Electronically Signed   By: Marcello Moores  Register   On: 01/16/2017 06:30   Dg Chest Port 1 View  Result Date:  01/15/2017 CLINICAL DATA:  76 year old female with a history of intubation EXAM: PORTABLE CHEST 1 VIEW COMPARISON:  01/15/2017, CT 01/14/2017 FINDINGS: Cardiomediastinal silhouette unchanged in size and contour. Calcifications of the aortic arch. Interval placement of endotracheal tube which terminates approximately 3 cm 2.8 cm above the carina. Unchanged right IJ central catheter which appears to terminate superior vena cava. No pneumothorax. Opacities at the bilateral lung bases with increasing opacity on the left. Partial obscuration left hemidiaphragm. IMPRESSION: Interval placement of endotracheal tube which terminates approximately 2.8 cm above the carina. Unchanged right IJ central catheter. Increasing opacity at the left base, potentially combination of pleural fluid and/or atelectasis/consolidation. Electronically Signed   By: Corrie Mckusick D.O.   On: 01/15/2017 19:50   Dg Chest Port 1 View  Result Date: 01/15/2017 CLINICAL DATA:  Central line placement.  Initial encounter. EXAM: PORTABLE CHEST 1 VIEW COMPARISON:  Chest radiograph performed 04/25/2014 FINDINGS: A right IJ line is noted ending about the mid SVC. The lungs are well-aerated and clear. There is no evidence of focal opacification, pleural effusion or pneumothorax. The cardiomediastinal silhouette is within normal limits. No acute osseous abnormalities are seen. IMPRESSION: Right IJ line noted ending about the mid SVC. Lungs clear bilaterally. Electronically Signed   By: Garald Balding M.D.   On: 01/15/2017 04:16   Dg Abd Portable 1v  Result Date: 01/15/2017 CLINICAL DATA:  Nasogastric tube placement.  Initial encounter. EXAM: PORTABLE ABDOMEN - 1 VIEW COMPARISON:  CT of the abdomen and pelvis from 01/14/2017 FINDINGS: The patient's enteric tube is noted ending overlying the body of the stomach. The visualized bowel gas pattern is unremarkable. Scattered air and stool filled loops of colon are seen; no abnormal dilatation of small  bowel loops is seen to suggest small bowel obstruction. No free intra-abdominal air is identified, though evaluation for free air is limited on a single supine view. The visualized osseous structures are within normal limits; the sacroiliac joints are unremarkable in appearance. IMPRESSION: Enteric tube noted ending overlying the body of the stomach. Electronically Signed   By: Garald Balding M.D.   On: 01/15/2017 21:59   Dg Ugi W/high Density W/kub  Result Date:  01/28/2017 CLINICAL DATA:  History of gastric bypass.  Feeding tube. EXAM: UPPER GI SERIES WITH KUB TECHNIQUE: After obtaining a scout radiograph a routine upper GI series was performed using thin density FLUOROSCOPY TIME:  Fluoroscopy Time:  1 minutes 54 seconds Radiation Exposure Index (if provided by the fluoroscopic device): 15.6 Number of Acquired Spot Images: 17 COMPARISON:  Abdomen 01/22/2017. FINDINGS: Feeding tube noted with tip over the upper abdomen. The patient was not able to drink barium through a straw annulus head using a splint. The patient would only drank a small amount of barium. The esophagus and gastric remnant are patent. Proximal small bowel is faintly visualized. Feeding tube tip most likely in an enteric loop. The patient would not tolerate any more imaging for further visualization of the gastroenteric anastomosis and proximal small bowel. IMPRESSION: Very limited exam as above. Esophagus and gastric remnant appear to be patent. Feeding tube noted with tip in enteric loop. Electronically Signed   By: Marcello Moores  Register   On: 01/28/2017 12:35   Ct Maxillofacial Wo Contrast  Result Date: 01/14/2017 CLINICAL DATA:  Fall last night. Bruising and swelling to the face and chest. EXAM: CT MAXILLOFACIAL WITHOUT CONTRAST TECHNIQUE: Multidetector CT imaging of the maxillofacial structures was performed. Multiplanar CT image reconstructions were also generated. COMPARISON:  CT head without contrast from the same day. FINDINGS:  Osseous: Bilateral nasal fractures are present. Hemorrhage in the left frontal sinus suggests an occult fracture. No discrete fracture is present. The visualized calvarium is intact. The mandible is intact and located. Advanced degenerative changes are noted within the TMJ, worse on the left. Orbits: Periorbital soft tissue swelling and hematoma is worse right than left. There is no underlying globe injury. Bilateral lens replacements are present. Sinuses: Minimal low-density fluid is present in the sphenoid sinuses bilaterally. Small polyps or mucous retention cysts are noted inferiorly in the maxillary sinuses. Hemorrhages noted in the left frontal sinus. The remaining paranasal sinuses are clear. The mastoid air cells are clear. Soft tissues: Focal high-density hemorrhage is noted over the bridge of the nose in the midline. Bilateral periorbital soft tissue swelling hematoma is present. There is soft tissue edema over the left side of the face. Calcifications in the subcutaneous tissues of face bilaterally may be related to prior trauma or injections. The airway is patent. The visualized soft tissues the neck are within normal limits. Limited intracranial: Within normal limits. IMPRESSION: 1. Bilateral nasal bone fractures without significant displacement. 2. Left frontal hemorrhage suggest an occult fracture. No discrete fracture is visualized. 3. Extensive bilateral periorbital soft swelling and hematoma with inflammatory changes extending over the maxilla bilaterally, left greater than right. Electronically Signed   By: San Morelle M.D.   On: 01/14/2017 15:28   US Abdomen Limited Ruq  Result Date: 01/21/2017 CLINICAL DATA:  Elevated bilirubin EXAM: ULTRASOUND ABDOMEN LIMITED RIGHT UPPER QUADRANT COMPARISON:  CT abdomen pelvis of 01/14/2017 FINDINGS: Gallbladder: There is a single gallstone within the neck of the gallbladder of 1.4 cm. There is no pain over the gallbladder, but there is some  thickening of the gallbladder wall. Developing acute cholecystitis cannot be excluded. Common bile duct: Diameter: The common bile duct measures 5.8 mm in diameter. Liver: The parenchyma of the liver is echogenic and inhomogeneous consistent with diffuse fatty infiltration. No focal hepatic abnormality is seen. Portal vein is patent on color Doppler imaging with normal direction of blood flow towards the liver. Small amount of ascites is noted adjacent to the liver as  well as a small amount within all 4 quadrants. IMPRESSION: 1. Single 1.4 cm gallstone with focally thickened gallbladder wall. Cannot exclude developing acute cholecystitis. Correlate clinically. 2. Echogenic liver parenchyma consistent with fatty infiltration. 3. Some ascites is noted as described above. Electronically Signed   By: Ivar Drape M.D.   On: 01/21/2017 16:52    Assessment:   DAZJA HOUCHIN is a 76 y.o. female admitted after a fall and found to have E coli bacteremia. She initially was septic but is out of unit now. WBC peaked at 32. Down to 46. Poor historian but possible sources of her persistent elevated wbc include gallbladder, or leg wounds.  She is PCN allergic.   Recommendations Cultured leg wound. Ordered USS abd - if still shows possible chole would consult surgery.  Continue meropenem and vanco for now.  Thank you very much for allowing me to participate in the care of this patient. Please call with questions.   Cheral Marker. Ola Spurr, MD

## 2017-01-28 NOTE — Progress Notes (Signed)
Nutrition Follow-up  DOCUMENTATION CODES:   Non-severe (moderate) malnutrition in context of chronic illness  INTERVENTION:  Continue Cyclic feeds from 5277-8242. Osmolite 1.5 at 68mL/hr, Pro-stat 73mL BID, regimen provides 1640 calories, 90gm protein, 747mL H2O daily.  Continue liquid MVI  Continue 288mL free water Q4H, provides total 1947mL free water daily  Continue daily weights  -Recommend 16 mg elemental zinc PO for 6 months (200% RDI), 2 mg/day IV copper for 6 days (followed by 1 mg PO daily until zinc supplementation is complete), 10000-25000 IU/day vitamin A orally/per tube for 1-2 weeks, 1-2 mg/day vitamin K orally/per tube. -Micronutrient supplementation was started on 10/15. Recommend continuing supplementation of vitamin A and vitamin K until 10/22. Continue zinc supplementation for 6 months (and appropriate dose of copper to prevent recurrence of copper deficiency - recommended ratio is 1 mg copper for each 8-15 mg elemental zinc). -Patient will need close monitoring of nutrition-related labs every 3 months.  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 Intubated on 10/10. Extubated 01/22/2017 Elevated procalcitonin now on broad-spectrum antibiotics, ID to see patient.  Discussed patient with Husband, RN, Speech Pathologist. Patient tolerated cyclic feeds, was hungry and thirsty this morning per husband. Had some ice cream, thickened liquids. Requesting ice. No pain or issues. Diet may be advanced to pureed per speech. Continues to have thrush but does not appear to cause an issue with swallowing. Underwent UGI and KUB today but she was unable to drink barium. Monitor PO intake  Labs reviewed:  Na 146, K 3.1, BUN 36,  Medications reviewed and include:  Vitamin D, Copper Gluconate, Folic Acid, B1, P53, Zinc  Intake/Output Summary (Last 24 hours) at 01/28/17 1331 Last data filed at 01/28/17 1100  Gross per 24 hour  Intake          1828.67 ml  Output              600 ml  Net          1228.67 ml   UOP 1365mL yesterday NGT tip to stomach  Diet Order:  Diet full liquid Room service appropriate? Yes with Assist; Fluid consistency: Nectar Thick  Skin:  Wound (see comment) (Stg I buttocks, weeping to arms and legs, cellulitis lower legs, blisters arms and legs, skin tear left leg)  Last BM:  01/27/2017(type 7)  Height:   Ht Readings from Last 1 Encounters:  01/14/17 5\' 6"  (1.676 m)    Weight:   Wt Readings from Last 1 Encounters:  01/28/17 142 lb (64.4 kg)    Ideal Body Weight:  59.1 kg  BMI:  Body mass index is 22.92 kg/m.  Estimated  Nutritional Needs:   Kcal:  1475-1700 (MSJ x 1.3-1.5)  Protein:  85-100 grams (1.4-1.6 grams/kg)  Fluid:  1.5-1.8 L/day (25-30 ml/kg)  EDUCATION NEEDS:   Education needs addressed  [place]a [place]. Sonya Dlouhy, MS, RD LDN Inpatient Clinical Dietitian Pager [phone]

## 2017-01-29 ENCOUNTER — Inpatient Hospital Stay: Payer: Medicare Other

## 2017-01-29 DIAGNOSIS — K7040 Alcoholic hepatic failure without coma: Secondary | ICD-10-CM

## 2017-01-29 LAB — HEPATIC FUNCTION PANEL
ALBUMIN: 1.5 g/dL — AB (ref 3.5–5.0)
ALT: 92 U/L — AB (ref 14–54)
AST: 126 U/L — AB (ref 15–41)
Alkaline Phosphatase: 167 U/L — ABNORMAL HIGH (ref 38–126)
BILIRUBIN DIRECT: 0.9 mg/dL — AB (ref 0.1–0.5)
BILIRUBIN TOTAL: 2.2 mg/dL — AB (ref 0.3–1.2)
Indirect Bilirubin: 1.3 mg/dL — ABNORMAL HIGH (ref 0.3–0.9)
Total Protein: 3.9 g/dL — ABNORMAL LOW (ref 6.5–8.1)

## 2017-01-29 LAB — BASIC METABOLIC PANEL
ANION GAP: 5 (ref 5–15)
BUN: 26 mg/dL — ABNORMAL HIGH (ref 6–20)
CO2: 27 mmol/L (ref 22–32)
Calcium: 6.8 mg/dL — ABNORMAL LOW (ref 8.9–10.3)
Chloride: 113 mmol/L — ABNORMAL HIGH (ref 101–111)
Creatinine, Ser: 0.37 mg/dL — ABNORMAL LOW (ref 0.44–1.00)
GLUCOSE: 77 mg/dL (ref 65–99)
POTASSIUM: 3.2 mmol/L — AB (ref 3.5–5.1)
Sodium: 145 mmol/L (ref 135–145)

## 2017-01-29 LAB — CBC
HEMATOCRIT: 28.1 % — AB (ref 35.0–47.0)
HEMOGLOBIN: 8.6 g/dL — AB (ref 12.0–16.0)
MCH: 35.8 pg — ABNORMAL HIGH (ref 26.0–34.0)
MCHC: 30.8 g/dL — ABNORMAL LOW (ref 32.0–36.0)
MCV: 116.3 fL — AB (ref 80.0–100.0)
Platelets: 187 10*3/uL (ref 150–440)
RBC: 2.41 MIL/uL — AB (ref 3.80–5.20)
RDW: 18.8 % — ABNORMAL HIGH (ref 11.5–14.5)
WBC: 12.4 10*3/uL — AB (ref 3.6–11.0)

## 2017-01-29 MED ORDER — MODAFINIL 100 MG PO TABS
100.0000 mg | ORAL_TABLET | Freq: Every day | ORAL | Status: DC
  Administered 2017-01-30 – 2017-02-11 (×13): 100 mg via ORAL
  Filled 2017-01-29 (×13): qty 1

## 2017-01-29 MED ORDER — IPRATROPIUM-ALBUTEROL 0.5-2.5 (3) MG/3ML IN SOLN
3.0000 mL | Freq: Three times a day (TID) | RESPIRATORY_TRACT | Status: DC
  Administered 2017-01-30 – 2017-02-04 (×15): 3 mL via RESPIRATORY_TRACT
  Filled 2017-01-29 (×16): qty 3

## 2017-01-29 MED ORDER — POTASSIUM CHLORIDE 10 MEQ/100ML IV SOLN
10.0000 meq | INTRAVENOUS | Status: AC
  Administered 2017-01-29 (×2): 10 meq via INTRAVENOUS
  Filled 2017-01-29 (×2): qty 100

## 2017-01-29 MED ORDER — POTASSIUM CHLORIDE 20 MEQ/15ML (10%) PO SOLN
40.0000 meq | Freq: Every day | ORAL | Status: DC
  Administered 2017-01-29: 40 meq
  Filled 2017-01-29 (×2): qty 30

## 2017-01-29 NOTE — Progress Notes (Signed)
Notified Dr.Diamond about potassium level of 3.1. Received orders for potassium supplement.

## 2017-01-29 NOTE — Care Management (Signed)
Patient to have the nasogastric tube removed 10/25 for swallowing evaluation.  She is npo today for tests so unable to perform oral trials. Husband would like to speak with representatives from [facility] and [facility] 10/25 if available.  [place] of abdomen is pending

## 2017-01-29 NOTE — Consult Note (Addendum)
SURGICAL CONSULTATION NOTE (initial) - cpt: 99255  HISTORY OF PRESENT ILLNESS (HPI):  76 y.o. female presented to [facility] ED 15 days ago for evaluation and management after she fell forward down 3 steps while she was walking to the bathroom outside of the mobile home where she and her husband are staying outside of their house due to mold in the house. All of this history is obtained from patient's husband and from the medical record due to patient's condition. At presentation, the patient with facial ecchymoses and encephalopathy denied recalling the events, but her husband states she may have been unconscious for 90 minutes before he found her in a pool of blood. Patient's husband states that the patient has increasingly fallen several times recently and drinks at least several glasses of wine every day despite her mother having died from alcoholic cirrhosis.  Initial workup found patient to be neutropenic with WBC (then 0.8) and lactic acid 6.7 with hypotension, for which she was admitted with vasopressors to ICU and was intubated. Further workup revealed e. Coli in urine and blood cultures. 7 days later, pressors were weaned, patient was extubated the following day, and tube feeds were initiated on hospital day 10, during which abdominal CT x1, x-ray x4, and ultrasound x2 were performed and during which patient's alertness and cognition have remained limited.  Despite limited information obtained from the patient, she consistently denies any abdominal pain and has been tolerating tube feeds.  Surgery is consulted by medical physician Dr. Posey Pronto and ID physician Dr. Ola Spurr in this context for evaluation and management of cholecystitis.  PAST MEDICAL HISTORY (PMH):  Past Medical History:  Diagnosis Date  . Arthritis   . Back pain   . Insomnia   . Medical history non-contributory   . Osteoporosis      PAST SURGICAL HISTORY ([place]):  Past Surgical History:  Procedure Laterality Date  .  ABDOMINAL HYSTERECTOMY    . BREAST LUMPECTOMY Right   . COLONOSCOPY WITH PROPOFOL N/A 07/18/2015   Procedure: COLONOSCOPY WITH PROPOFOL;  Surgeon: [name], MD;  Location: [facility] ENDOSCOPY;  Service: Endoscopy;  Laterality: N/A;  . COSMETIC SURGERY    . GASTRIC BYPASS     X2  . HEMORROIDECTOMY    . KNEE ARTHROPLASTY Right 12/13/2015   Procedure: COMPUTER ASSISTED TOTAL KNEE ARTHROPLASTY;  Surgeon: Dereck Leep, MD;  Location: [facility] ORS;  Service: Orthopedics;  Laterality: Right;  . KNEE ARTHROSCOPY    . REPLACEMENT TOTAL KNEE Left      MEDICATIONS:  Prior to Admission medications   Medication Sig Start Date End Date Taking? Authorizing Provider  aspirin 81 MG tablet Take 81 mg by mouth daily.   Yes [provider]  CALCIUM CARBONATE PO Take 1 tablet by mouth daily.    Yes [provider]  cholecalciferol (VITAMIN D) 1000 UNITS tablet Take 2,000 Units by mouth daily.    Yes [provider]  estrogens, conjugated, (PREMARIN) 0.625 MG tablet Take 0.625 mg by mouth daily. Take daily for 21 days then do not take for 7 days.   Yes [provider]  ibandronate (BONIVA) 150 MG tablet Take 150 mg by mouth every 30 (thirty) days. Take in the morning with a full glass of water, on an empty stomach, and do not take anything else by mouth or lie down for the next 30 min.   Yes [provider]  megestrol (MEGACE) 20 MG tablet Take 1 tablet by mouth daily. 12/30/16 01/29/17 Yes [provider]  Probiotic Product (DIGESTIVE ADVANTAGE GUMMIES) CHEW Chew 3 Doses by mouth daily.    Yes [provider]  clindamycin (CLEOCIN) 150 MG capsule Take 600 mg by mouth daily as needed. Dental work    Secondary school teacher, Historical, MD  DiphenhydrAMINE HCl (ZZZQUIL) 50 MG/30ML LIQD Take 30 mLs by mouth at bedtime as needed (for sleep).    [provider]  oxyCODONE (OXY IR/ROXICODONE) 5 MG immediate release tablet Take 1-2 tablets (5-10 mg total) by mouth every  4 (four) hours as needed for moderate pain. Patient not taking: Reported on 01/14/2017 12/15/15   Lattie Corns, PA-C  traMADol (ULTRAM) 50 MG tablet Take 1-2 tablets (50-100 mg total) by mouth every 4 (four) hours as needed for moderate pain. Patient not taking: Reported on 01/14/2017 12/15/15   Lattie Corns, PA-C     ALLERGIES:  Allergies  Allergen Reactions  . Other Itching    "mycin" doesn't know which one  . Penicillins Swelling    Has patient had a PCN reaction causing immediate rash, facial/tongue/throat swelling, SOB or lightheadedness with hypotension: Yes Has patient had a PCN reaction causing severe rash involving mucus membranes or skin necrosis: No Has patient had a PCN reaction that required hospitalization: No Has patient had a PCN reaction occurring within the last 10 years: No If all of the above answers are "NO", then may proceed with Cephalosporin use.     SOCIAL HISTORY:  Social History   Social History  . Marital status: Married    Spouse name: N/A  . Number of children: N/A  . Years of education: N/A   Occupational History  . Not on file.   Social History Main Topics  . Smoking status: Former Smoker    Quit date: 11/30/1994  . Smokeless tobacco: Never Used  . Alcohol use 12.6 - 21.0 oz/week    21 - 35 Glasses of wine per week     Comment: 3-5 glasses of wine/ day  . Drug use: No  . Sexual activity: Not on file   Other Topics Concern  . Not on file   Social History Narrative  . No narrative on file    The patient currently resides (home / rehab facility / nursing home): Home The patient normally is (ambulatory / bedbound): Ambulatory with increasing falls over past several months  FAMILY HISTORY:  Family History  Problem Relation Age of Onset  . Liver disease Mother   . Heart attack Father   . Cancer Father   . CAD Sister      REVIEW OF SYSTEMS: Unable to assess beyond HPI due to patient's condition and encephalopathy   VITAL  SIGNS:  Temp:  [98.4 F (36.9 C)-98.6 F (37 C)] 98.4 F (36.9 C) (10/24 0433) Pulse Rate:  [99-106] 100 (10/24 1045) Resp:  [14-18] 14 (10/24 0911) BP: (126-153)/(77-89) 132/77 (10/24 0911) SpO2:  [94 %-100 %] 98 % (10/24 1045) Weight:  [147 lb (66.7 kg)] 147 lb (66.7 kg) (10/24 0433)     Height: 5\' 6"  (167.6 cm) Weight: 147 lb (66.7 kg) BMI (Calculated): 23.74   INTAKE/OUTPUT:  This shift: Total I/O In: 200 [NG/GT:200] Out: -   Last 2 shifts: @IOLAST2SHIFTS @   PHYSICAL EXAM:  Constitutional:  -- Anasarca and jaundice  -- Awake and alert, but non-verbal  Eyes:  -- Pupils equally round and reactive to light  -- +Scleral icterus  Ear, nose, and throat:  -- +Facial ecchymosis, no jugular venous distension  Pulmonary:  --  No crackles  -- Equally decreased breath sounds bilaterally -- Breathing non-labored at rest Cardiovascular:  -- S1, S2 present  -- No pericardial rubs Gastrointestinal:  -- Abdomen soft, completely non-tender with even deep palpation, non-distended, no guarding or rebound tenderness -- No abdominal masses appreciated, pulsatile or otherwise  Musculoskeletal and Integumentary:  -- Wounds or skin discoloration: None appreciated except where noted otherwise above and below -- Extremities: LLE duskiness and diffuse B/L ecchymosis  Neurologic:  -- Motor function: B/L UE strength > impaired LLE strength -- Sensation: unable to assess  Labs:  CBC Latest Ref Rng & Units 01/29/2017 01/28/2017 01/27/2017  WBC 3.6 - 11.0 K/uL 12.4(H) 17.4(H) 21.9(H)  Hemoglobin 12.0 - 16.0 g/dL 8.6(L) 10.0(L) 10.2(L)  Hematocrit 35.0 - 47.0 % 28.1(L) 31.9(L) 32.2(L)  Platelets 150 - 440 K/uL 187 202 156   CMP Latest Ref Rng & Units 01/29/2017 01/28/2017 01/27/2017  Glucose 65 - 99 mg/dL 77 125(H) 109(H)  BUN 6 - 20 mg/dL 26(H) 36(H) 43(H)  Creatinine 0.44 - 1.00 mg/dL 0.37(L) 0.57 0.32(L)  Sodium 135 - 145 mmol/L 145 146(H) 149(H)  Potassium 3.5 - 5.1 mmol/L 3.2(L) 3.1(L)  4.4  Chloride 101 - 111 mmol/L 113(H) 113(H) 113(H)  CO2 22 - 32 mmol/L 27 28 25   Calcium 8.9 - 10.3 mg/dL 6.8(L) 7.7(L) 7.6(L)  Total Protein 6.5 - 8.1 g/dL - - 4.4(L)  Total Bilirubin 0.3 - 1.2 mg/dL - - 4.4(H)  Alkaline Phos 38 - 126 U/L - - 235(H)  AST 15 - 41 U/L - - 233(H)  ALT 14 - 54 U/L - - 116(H)   Coagulation:  Lab Results  Component Value Date   INR 1.29 01/14/2017   APTT 30 11/30/2015   Child's-Pugh score (at admission/initial testing): T bili: 3.8 (+3) + albumin: 2.5 (+3) + INR: 1.29 (+1) + ascites: mild (+2) + encephalopathy: grade 1-2 (+2) with ammonia: 52 = 11 (>10 --> Child's C --> 45% 1-year mortality, 35% 2-year mortality)   Imaging studies:  Complete Abdominal Ultrasound (01/28/2017) The gallbladder wall is markedly thickened, measuring up to 8.8 mm. A 1.1 cm shadowing stone is again noted at the neck of the gallbladder. There is no sonographic Murphy sign. Layering sludge is present.  There is diffuse fatty infiltration of liver. No discrete lesions are present. Portal vein is patent on color Doppler imaging with normal direction of blood flow towards the liver. Increasing diffuse abdominal ascites is present.  Limited RUQ Abdominal Ultrasound (01/21/2017) There is a single gallstone within the neck of the gallbladder of 1.4 cm. There is no pain over the gallbladder, but there is some thickening of the gallbladder wall. Developing acute cholecystitis cannot be excluded.  The parenchyma of the liver is echogenic and inhomogeneous consistent with diffuse fatty infiltration. No focal hepatic abnormality is seen. Portal vein is patent on color Doppler imaging with normal direction of blood flow towards the liver. Small amount of ascites is noted adjacent to the liver as well as a small amount within all 4 quadrants.  CT Abdomen and Pelvis with Contrast (01/14/2017) 1. Age-indeterminate T12 inferior endplate compression deformity. Progressive chronic inferior  endplate deformity involves the L3 vertebra. 2. No evidence for solid organ or hollow viscus injury. 3. Small pulmonary nodules measure up to 4 mm. Nonspecific. No follow-up needed if patient is low-risk (and has no known or suspected primary neoplasm). Non-contrast chest CT can be considered in 12 months if patient is high-risk. This recommendation follows the consensus statement: Guidelines for  Management of Incidental Pulmonary Nodules Detected on CT Images: From the Fleischner Society 2017; Radiology 2017; 284:228-243. 4. Aortic Atherosclerosis (ICD10-I70.0). Lad coronary artery calcification noted. 5. Hepatic steatosis.  Assessment/Plan: (ICD-10's: K21.4) 76 y.o. 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   - IV antibiotics   - GI consultation for hepatic management  - both cholecystectomy and cholecystostomy carry elevated risks for this patient and appear to be of little benefit with no abdominal pain, decreasing WBC, and liver failure  - alcohol cessation, avoid hepatotoxic medications such as acetominophen  - medical management and supportive care per primary team  - HIDA currently seems unlikely to alter management  All of the above findings and recommendations were discussed with the patient and her family, and all of patient's and her family's questions were answered to her expressed satisfaction.  Thank you for the opportunity to participate in this patient's care.   -- Marilynne Drivers Rosana Hoes, MD, [place]: [facility] - Partnering for exceptional care. Office: [phone]

## 2017-01-29 NOTE — Clinical Social Work Note (Signed)
CSW continuing to follow patient's progress, patient's husband plans to speak to [facility] admissions workers on Thursday.  Jones Broom. Norval Morton, MSW, [place]  01/29/2017 6:58 PM

## 2017-01-29 NOTE — Consult Note (Signed)
[place] Nurse wound follow up Reason for Consult: Edema to all extremities.  Large ulceration left posterior calf due to ruptured serum filled blisters.  Demarcation of the toes with a dusky look. They are warm today with good capillary refill Bruising upper extremities and lower extremities. Bruising around bilateral eyelids. Patient is awake and responsive and spouse at bedside.  Wound type: blistering from edema, ruptured with sloughing epithelium and devitalized tissue.   Pressure Injury POA: NA Measurement: Left lower leg:  4 cm x 3.4 cm x 0.1 cm ruptured serum filled blister Left dorsal foot:  2 cm x 2 cm x 0.1 ruptured blister with sloughing epithelium Conservative sharps debridement to sloughing epithelium reveals a pink wound bed.  Wound culture results pending.  Wound XOV:ANVB and moist Drainage (amount, consistency, odor) Moderate serous weeping to all extremities Periwound:Edema Dressing procedure/placement/frequency:Cleanse ruptured blisters with soap and water and pat gently dry.  Apply Aquacel AG dressing to wound bed for absorption.  Cover with 4x4 gauze and kerlix/tape.  Change daily.  Will not follow at this time.  Please re-consult if needed.  Domenic Moras RN BSN [place] Pager [phone]

## 2017-01-29 NOTE — Progress Notes (Signed)
SLP Cancellation Note  Patient Details Name: MARY-ANNE POLIZZI MRN: [id number] DOB: 1940-11-16   Cancelled treatment:       Reason Eval/Treat Not Completed: Patient at procedure or test/unavailable (chart reviewed; consulted NSG re: pt's status this AM)  Pt is currently NPO today for abdominal ultrasound(?) per NSG. ST services will f/u w/ pt's status tomorrow for appropriateness to proceed w/ trials to upgrade oral diet which would include removing the NG tube to allow for such assessment of diet upgrade(d/t Esophageal dysmotility premorbid - MD was consulted re: this and agreed). NSG will update husband when present today.    Orinda Kenner, MS, CCC-SLP [place] 01/29/2017, 9:38 AM

## 2017-01-29 NOTE — Progress Notes (Signed)
[facility] INFECTIOUS DISEASE PROGRESS NOTE Date of Admission:  01/14/2017     ID: Sonya Park is a 76 y.o. 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  ROS  Unable to obtain    Medications:  Antibiotics Given (last 72 hours)    Date/Time Action Medication Dose Rate   01/26/17 2151 Given   rifaximin (XIFAXAN) tablet 550 mg 550 mg    01/27/17 0931 Given   rifaximin (XIFAXAN) tablet 550 mg 550 mg    01/27/17 1653 New Bag/Given   meropenem (MERREM) 1 g in sodium chloride 0.9 % 100 mL IVPB 1 g 200 mL/hr   01/27/17 1744 New Bag/Given   vancomycin (VANCOCIN) IVPB 1000 mg/200 mL premix 1,000 mg 200 mL/hr   01/27/17 2106 Given   rifaximin (XIFAXAN) tablet 550 mg 550 mg    01/27/17 2347 New Bag/Given   vancomycin (VANCOCIN) IVPB 750 mg/150 ml premix 750 mg 150 mL/hr   01/28/17 0100 New Bag/Given   meropenem (MERREM) 1 g in sodium chloride 0.9 % 100 mL IVPB 1 g 200 mL/hr   01/28/17 1011 Given   rifaximin (XIFAXAN) tablet 550 mg 550 mg    01/28/17 1538 New Bag/Given   vancomycin (VANCOCIN) IVPB 1000 mg/200 mL premix 1,000 mg 200 mL/hr   01/28/17 1610 New Bag/Given  [loss of IV access, PICC line was placed.]   meropenem (MERREM) 1 g in sodium chloride 0.9 % 100 mL IVPB 1 g 200 mL/hr   01/28/17 2238 Given   rifaximin (XIFAXAN) tablet 550 mg 550 mg    01/28/17 2358 New Bag/Given   meropenem (MERREM) 1 g in sodium chloride 0.9 % 100 mL IVPB 1 g 200 mL/hr   01/29/17 0504 New Bag/Given   vancomycin (VANCOCIN) IVPB 1000 mg/200 mL premix 1,000 mg 200  mL/hr   01/29/17 1024 Given   rifaximin (XIFAXAN) tablet 550 mg 550 mg    01/29/17 1024 New Bag/Given   meropenem (MERREM) 1 g in sodium chloride 0.9 % 100 mL IVPB 1 g 200 mL/hr     . chlorhexidine gluconate (MEDLINE KIT)  15 mL Mouth Rinse BID  . cholecalciferol  2,000 Units Oral Daily  . Copper Gluconate  1 tablet Oral Daily  . enoxaparin (LOVENOX) injection  40 mg Subcutaneous Q24H  . feeding supplement (OSMOLITE 1.5 CAL)  1,000 mL Per Tube Q1500  . feeding supplement (PRO-STAT SUGAR FREE 64)  30 mL Per Tube BID  . folic acid  1 mg Per Tube Daily  . free water  200 mL Per Tube Q4H  . furosemide  20 mg Intravenous Q12H  . ipratropium-albuterol  3 mL Nebulization Q6H  . modafinil  100 mg Oral Daily  . multivitamin  15 mL Per Tube Daily  . phytonadione  10 mg Per Tube Daily  . potassium chloride  40 mEq Per Tube Daily  . rifaximin  550 mg Per Tube BID  . sodium chloride flush  10-40 mL Intracatheter Q12H  . thiamine  100 mg Per Tube Daily  .  vitamin B-12  1,000 mcg Per Tube Daily  . zinc sulfate  220 mg Per Tube Daily    Objective: Vital signs in last 24 hours: Temp:  [98.4 F (36.9 C)-98.6 F (37 C)] 98.4 F (36.9 C) (10/24 0433) Pulse Rate:  [99-106] 100 (10/24 1045) Resp:  [14-18] 14 (10/24 0911) BP: (126-153)/(77-89) 132/77 (10/24 0911) SpO2:  [94 %-100 %] 98 % (10/24 1045) Weight:  [66.7 kg (147 lb)] 66.7 kg (147 lb) (10/24 0433) Constitutional:  Thin, frail, ecchymosis over face  HENT: /AT, PERRLA, + jaundiced Mouth/Throat: lips cracked and  dry . NGT in place  Cardiovascular: Normal rate, regular rhythm and normal heart sounds.  Pulmonary/Chest: poor air movement Neck = supple, no nuchal rigidity Abdominal: Soft. Bowel sounds are normal.  Obese, ? Grimaces in RUQ palp Lymphadenopathy: no cervical adenopathy. No axillary adenopathy Neurological: lethargic Ext anasarca Skin: L leg with multiple wounds, some necrotic appearing Psychiatric:lethargic  Lab  Results  Recent Labs  01/28/17 0441 01/29/17 1339  WBC 17.4* 12.4*  HGB 10.0* 8.6*  HCT 31.9* 28.1*  NA 146* 145  K 3.1* 3.2*  CL 113* 113*  CO2 28 27  BUN 36* 26*  CREATININE 0.57 0.37*    Microbiology: Results for orders placed or performed during the hospital encounter of 01/14/17  Blood Culture (routine x 2)     Status: Abnormal   Collection Time: 01/14/17 11:08 PM  Result Value Ref Range Status   Specimen Description BLOOD LFOA  Final   Special Requests   Final    BOTTLES DRAWN AEROBIC AND ANAEROBIC Blood Culture results may not be optimal due to an excessive volume of blood received in culture bottles   Culture  Setup Time   Final    GRAM NEGATIVE RODS IN BOTH AEROBIC AND ANAEROBIC BOTTLES CRITICAL RESULT CALLED TO, READ BACK BY AND VERIFIED WITH:  MICHAEL SIMPSON AT 1207 01/15/17 SDR    Culture ESCHERICHIA COLI (A)  Final   Report Status 01/17/2017 FINAL  Final   Organism ID, Bacteria ESCHERICHIA COLI  Final      Susceptibility   Escherichia coli - MIC*    AMPICILLIN >=32 RESISTANT Resistant     CEFAZOLIN <=4 SENSITIVE Sensitive     CEFEPIME <=1 SENSITIVE Sensitive     CEFTAZIDIME <=1 SENSITIVE Sensitive     CEFTRIAXONE <=1 SENSITIVE Sensitive     CIPROFLOXACIN 1 SENSITIVE Sensitive     GENTAMICIN <=1 SENSITIVE Sensitive     IMIPENEM <=0.25 SENSITIVE Sensitive     TRIMETH/SULFA >=320 RESISTANT Resistant     AMPICILLIN/SULBACTAM >=32 RESISTANT Resistant     PIP/TAZO <=4 SENSITIVE Sensitive     Extended ESBL NEGATIVE Sensitive     * ESCHERICHIA COLI  Blood Culture (routine x 2)     Status: Abnormal   Collection Time: 01/14/17 11:08 PM  Result Value Ref Range Status   Specimen Description BLOOD LAC  Final   Special Requests   Final    BOTTLES DRAWN AEROBIC AND ANAEROBIC Blood Culture results may not be optimal due to an excessive volume of blood received in culture bottles   Culture  Setup Time   Final    GRAM NEGATIVE RODS IN BOTH AEROBIC AND ANAEROBIC  BOTTLES CRITICAL VALUE NOTED.  VALUE IS CONSISTENT WITH PREVIOUSLY REPORTED AND CALLED VALUE.    Culture (A)  Final    ESCHERICHIA COLI SUSCEPTIBILITIES PERFORMED ON PREVIOUS CULTURE WITHIN THE LAST 5 DAYS. Performed at [facility], [address] [place], [place] [address]    Report Status 01/17/2017 FINAL  Final  Urine culture     Status: Abnormal   Collection Time: 01/14/17 11:08 PM  Result Value Ref Range Status   Specimen Description URINE, RANDOM  Final   Special Requests NONE  Final   Culture (A)  Final    <10,000 COLONIES/mL INSIGNIFICANT GROWTH Performed at [facility], [place] [address], [place], [place] [address]    Report Status 01/16/2017 FINAL  Final  Blood Culture ID Panel (Reflexed)     Status: Abnormal   Collection Time: 01/14/17 11:08 PM  Result Value Ref Range Status   Enterococcus species NOT DETECTED NOT DETECTED Final   Listeria monocytogenes NOT DETECTED NOT DETECTED Final   Staphylococcus species NOT DETECTED NOT DETECTED Final   Staphylococcus aureus NOT DETECTED NOT DETECTED Final   Streptococcus species NOT DETECTED NOT DETECTED Final   Streptococcus agalactiae NOT DETECTED NOT DETECTED Final   Streptococcus pneumoniae NOT DETECTED NOT DETECTED Final   Streptococcus pyogenes NOT DETECTED NOT DETECTED Final   Acinetobacter baumannii NOT DETECTED NOT DETECTED Final   Enterobacteriaceae species DETECTED (A) NOT DETECTED Final    Comment: Enterobacteriaceae represent a large family of gram-negative bacteria, not a single organism. CRITICAL RESULT CALLED TO, READ BACK BY AND VERIFIED WITH:  MICHAEL SIMPSON AT 1207 01/15/17 SDR    Enterobacter cloacae complex NOT DETECTED NOT DETECTED Final   Escherichia coli DETECTED (A) NOT DETECTED Final    Comment: CRITICAL RESULT CALLED TO, READ BACK BY AND VERIFIED WITH:  MICHAEL SIMPSON AT 1207 01/15/17 SDR    Klebsiella oxytoca NOT DETECTED NOT DETECTED Final   Klebsiella pneumoniae NOT DETECTED NOT  DETECTED Final   Proteus species NOT DETECTED NOT DETECTED Final   Serratia marcescens NOT DETECTED NOT DETECTED Final   Carbapenem resistance NOT DETECTED NOT DETECTED Final   Haemophilus influenzae NOT DETECTED NOT DETECTED Final   Neisseria meningitidis NOT DETECTED NOT DETECTED Final   Pseudomonas aeruginosa NOT DETECTED NOT DETECTED Final   Candida albicans NOT DETECTED NOT DETECTED Final   Candida glabrata NOT DETECTED NOT DETECTED Final   Candida krusei NOT DETECTED NOT DETECTED Final   Candida parapsilosis NOT DETECTED NOT DETECTED Final   Candida tropicalis NOT DETECTED NOT DETECTED Final  MRSA PCR Screening     Status: None   Collection Time: 01/20/17 11:04 AM  Result Value Ref Range Status   MRSA by PCR NEGATIVE NEGATIVE Final    Comment:        The GeneXpert MRSA Assay (FDA approved for NASAL specimens only), is one component of a comprehensive MRSA colonization surveillance program. It is not intended to diagnose MRSA infection nor to guide or monitor treatment for MRSA infections.   CULTURE, BLOOD (ROUTINE X 2) w Reflex to ID Panel     Status: None (Preliminary result)   Collection Time: 01/26/17  4:08 PM  Result Value Ref Range Status   Specimen Description BLOOD BLOOD LEFT WRIST  Final   Special Requests   Final    BOTTLES DRAWN AEROBIC AND ANAEROBIC Blood Culture adequate volume   Culture NO GROWTH 3 DAYS  Final   Report Status PENDING  Incomplete  CULTURE, BLOOD (ROUTINE X 2) w Reflex to ID Panel     Status: None (Preliminary result)   Collection Time: 01/26/17  4:08 PM  Result Value Ref Range Status   Specimen Description BLOOD BLOOD RIGHT WRIST  Final   Special Requests  Final    BOTTLES DRAWN AEROBIC AND ANAEROBIC Blood Culture results may not be optimal due to an inadequate volume of blood received in culture bottles   Culture NO GROWTH 3 DAYS  Final   Report Status PENDING  Incomplete  Aerobic Culture (superficial specimen)     Status: None  (Preliminary result)   Collection Time: 01/28/17  3:28 PM  Result Value Ref Range Status   Specimen Description WOUND  Final   Special Requests Normal  Final   Gram Stain   Final    MODERATE WBC PRESENT, PREDOMINANTLY PMN NO ORGANISMS SEEN    Culture   Final    NO GROWTH < 24 HOURS Performed at [facility], [place] [address] [place], [place] [address]    Report Status PENDING  Incomplete    Studies/Results: Ct Head Wo Contrast  Result Date: 01/27/2017 CLINICAL DATA:  Increased confusion, extensive facial bruising from prior fall-already imaged EXAM: CT HEAD WITHOUT CONTRAST TECHNIQUE: Contiguous axial images were obtained from the base of the skull through the vertex without intravenous contrast. COMPARISON:  None. FINDINGS: Brain: No intracranial hemorrhage. No parenchymal contusion. No midline shift or mass effect. Basilar cisterns are patent. No skull base fracture. No fluid in the paranasal sinuses or mastoid air cells. Orbits are normal. There are periventricular and subcortical white matter hypodensities. Generalized cortical atrophy. Vascular: No hyperdense vessel or unexpected calcification. Skull: Normal. Negative for fracture or focal lesion. Sinuses/Orbits: Paranasal sinuses and mastoid air cells are clear. Orbits are clear. Other: Small midline frontal scalp hematoma. IMPRESSION: 1. No intracranial trauma. 2. Small frontal scalp hematoma. 3. Atrophy and white matter microvascular disease per Electronically Signed   By: Suzy Bouchard M.D.   On: 01/27/2017 15:17   US Abdomen Complete  Result Date: 01/29/2017 CLINICAL DATA:  Elevated LFTs. EXAM: ABDOMEN ULTRASOUND COMPLETE COMPARISON:  Right upper quadrant ultrasound 01/21/2017. CT of the abdomen and pelvis 01/14/2017. FINDINGS: Gallbladder: The gallbladder wall is markedly thickened, measuring up to 8.8 mm. A 1.1 cm shadowing stone is again noted at the neck of the gallbladder. There is no sonographic Murphy sign. Layering  sludge is present. Common bile duct: Diameter: 4.1 mm, within normal limits. Liver: There is diffuse fatty infiltration of liver. No discrete lesions are present. Portal vein is patent on color Doppler imaging with normal direction of blood flow towards the liver. IVC: No abnormality visualized. Pancreas: Visualized portion unremarkable. Spleen: Size and appearance within normal limits. Maximal length is 4.8 cm. Right Kidney: Length: 10.4 cm, within normal limits. Echogenicity within normal limits. No mass or hydronephrosis visualized. Left Kidney: Length: 11.7 cm, within normal limits. Echogenicity within normal limits. No mass or hydronephrosis visualized. Abdominal aorta: Maximal diameter is 2.7 cm. Atherosclerotic changes are noted. Other findings: Increasing diffuse abdominal ascites is present. IMPRESSION: 1. Progressive wall thickening of the gallbladder with a 1.1 cm shadowing stone at the neck of the gallbladder. Findings are concerning for developing acute cholecystitis. 2. Progressive abdominal ascites. 3. Hepatic steatosis. Electronically Signed   By: San Morelle M.D.   On: 01/29/2017 14:41   Dg Ugi W/high Density W/kub  Result Date: 01/28/2017 CLINICAL DATA:  History of gastric bypass.  Feeding tube. EXAM: UPPER GI SERIES WITH KUB TECHNIQUE: After obtaining a scout radiograph a routine upper GI series was performed using thin density FLUOROSCOPY TIME:  Fluoroscopy Time:  1 minutes 54 seconds Radiation Exposure Index (if provided by the fluoroscopic device): 15.6 Number of Acquired Spot Images: 17 COMPARISON:  Abdomen 01/22/2017. FINDINGS: Feeding tube noted with tip over the upper abdomen. The patient was not able to drink barium through a straw annulus head using a splint. The patient would only drank a small amount of barium. The esophagus and gastric remnant are patent. Proximal small bowel is faintly visualized. Feeding tube tip most likely in an enteric loop. The patient would not  tolerate any more imaging for further visualization of the gastroenteric anastomosis and proximal small bowel. IMPRESSION: Very limited exam as above. Esophagus and gastric remnant appear to be patent. Feeding tube noted with tip in enteric loop. Electronically Signed   By: Marcello Moores  Register   On: 01/28/2017 12:35    Assessment/Plan: JAMONI BROADFOOT is a 76 y.o. female admitted after a fall and found to have E coli bacteremia. She initially was septic but is out of unit now. WBC peaked at 32. Was initially on only cipro for the E coli bacteremia. Down to 12 since change from cipro to vanco and meropenem. Poor historian but possible sources of her persistent elevated wbc include gallbladder, or leg wounds.  She is PCN allergic.  10/24- US shows increasing GB wall thickness and a stone in neck.  I think she likely has cholecystitis.  Culture from leg wound pending. Seen by wound care.  Recommendations I have placed a surgery consult to see what would be best step to further work up and manage the possible cholecystitis. May need Hida scan.  Not sure she would be a great surgical candidate, may consider cholecystotomy tube.  I have added on LFTs to today's labs Continue meropenem and vanco for now.  Continue wound care per wound care consult recs. Thank you very much for the consult. Will follow with you.  Leonel Ramsay   01/29/2017, 2:53 PM

## 2017-01-29 NOTE — Progress Notes (Signed)
Patient attempted to communicate with staff last night but with little success. Patient continues to nod or shake her head. Rectal tube was leaking at this morning, flushed and advanced. Soft tissue nodule on forehead appears more pronounced this shift. No complaints of pain. Patient has rested between repositioning and care. Will continue to monitor and assess.

## 2017-01-29 NOTE — Progress Notes (Signed)
Physical Therapy Treatment Patient Details Name: Sonya Park MRN: [id number] DOB: June 02, 1940 Today's Date: 01/29/2017    History of Present Illness 76 y/o female who suffered a fall with considerable facial fractures and ended up being intubated 10/10-10/17 in CCU.  h/o ETOH abuse, e. coli (+)    PT Comments    Spoke with nurse prior to treatment attempt due to low K level of 3.1. Nurse notes pt starting supplementation and can be seen. Pt notes pain in abdomen at a moderate level; communicates through head nod. Pt agreeable to exercises and held to exercises due multiple factors (K level, pain, fatigue, expected to leave room for [place]). Pt requires assist for exercises; spouse present and educated on all exercises and providing assist. Encouraged to perform as able several times per day. Pt/spouse understand. Continue PT as tolerated to progress strength, endurance and up/out of bed activity to improve functional mobility.    Follow Up Recommendations  SNF     Equipment Recommendations  Rolling walker with 5" wheels    Recommendations for Other Services       Precautions / Restrictions Precautions Precautions: Fall Restrictions Weight Bearing Restrictions: No    Mobility  Bed Mobility               General bed mobility comments: Not tested due to 3.1 potassium and fatigue level of patient, as well as abdominal pain, which is aggravated by movement  Transfers                    Ambulation/Gait                 Stairs            Wheelchair Mobility    Modified Rankin (Stroke Patients Only)       Balance                                            Cognition Arousal/Alertness: Awake/alert Behavior During Therapy: Flat affect Overall Cognitive Status: Impaired/Different from baseline                                 General Comments: Pt converses through head nods only      Exercises General Exercises -  Lower Extremity Ankle Circles/Pumps: AROM;Both;15 reps;Supine Quad Sets: Strengthening;Both;10 reps;Supine (performed single side at a time) Short Arc Quad: AAROM;Both;10 reps;Supine Heel Slides: AAROM;Both;10 reps;Supine Hip ABduction/ADduction: AAROM;Both;10 reps;Supine Other Exercises Other Exercises: Education provided to spouse for ability to assist pt throughout the day with exercises as tolerated.     General Comments        Pertinent Vitals/Pain Pain Assessment: Faces Faces Pain Scale: Hurts even more Pain Location: Abdomen Pain Intervention(s): Monitored during session;Limited activity within patient's tolerance    Home Living                      Prior Function            PT Goals (current goals can now be found in the care plan section) Progress towards PT goals: Progressing toward goals (slowly)    Frequency    Min 2X/week      PT Plan Current plan remains appropriate    Co-evaluation  AM-PAC PT "6 Clicks" Daily Activity  Outcome Measure  Difficulty turning over in bed (including adjusting bedclothes, sheets and blankets)?: Unable Difficulty moving from lying on back to sitting on the side of the bed? : Unable Difficulty sitting down on and standing up from a chair with arms (e.g., wheelchair, bedside commode, etc,.)?: Unable Help needed moving to and from a bed to chair (including a wheelchair)?: Total Help needed walking in hospital room?: Total Help needed climbing 3-5 steps with a railing? : Total 6 Click Score: 6    End of Session Equipment Utilized During Treatment: Oxygen Activity Tolerance: Patient limited by fatigue;Patient limited by pain;Other (comment) (low K) Patient left: in bed;with call bell/phone within reach;with bed alarm set;with family/visitor present Nurse Communication: Other (comment) (regarding K level and appropriateness of tx) PT Visit Diagnosis: Muscle weakness (generalized) (M62.81);Difficulty  in walking, not elsewhere classified (R26.2)     Time: 2355-7322 PT Time Calculation (min) (ACUTE ONLY): 24 min  Charges:  $Therapeutic Exercise: 23-37 mins                    G Codes:        Larae Grooms, PTA 01/29/2017, 11:23 AM

## 2017-01-29 NOTE — Progress Notes (Signed)
Patient ID: Sonya Park, female   DOB: [birth year], 76 y.o.   MRN: [id number]   [facility] PROGRESS NOTE  Sonya Park ASU:[id number] DOB: 1940/08/20 DOA: 01/14/2017 PCP: Cletis Athens, MD  HPI/Subjective: Pt drowsy now husband at bedside  Objective: Vitals:   01/29/17 0911 01/29/17 1045  BP: 132/77   Pulse: (!) 101 100  Resp: 14   Temp:    SpO2: 100% 98%    Filed Weights   01/27/17 0310 01/28/17 0332 01/29/17 0433  Weight: 143 lb (64.9 kg) 142 lb (64.4 kg) 147 lb (66.7 kg)    ROS: Review of Systems  Unable to perform ROS: Mental status change  Constitutional: Negative for fever.   Exam: Physical Exam  HENT:  Nose: No mucosal edema.  Mouth/Throat: No oropharyngeal exudate.  Eyes: Pupils are equal, round, and reactive to light. Lids are normal.  Icteric.   Neck: Carotid bruit is not present. No thyromegaly present.  Cardiovascular: Regular rhythm, S1 normal and S2 normal.   Respiratory: She has decreased breath sounds in the right lower field and the left lower field. She has no wheezes. She has no rhonchi. She has no rales.  GI: Soft. Bowel sounds are normal. There is no tenderness.  Musculoskeletal:       Right elbow: She exhibits swelling.       Left elbow: She exhibits swelling.       Right wrist: She exhibits swelling.       Left wrist: She exhibits tenderness.       Right knee: She exhibits swelling.       Left knee: She exhibits swelling.       Right ankle: She exhibits swelling.       Left ankle: She exhibits swelling.  Lymphadenopathy:    She has no cervical adenopathy.  Neurological: She is alert.  Patient barely able to straight leg raise. Patient able to lift arms up off the bed  Skin:  Large ulceration left posterior calf. Demarcation of the toes with a dusky look. Left third and fourth toe still brownish lookingBruising upper extremities and lower extremities.  Bruising around bilateral eyelids. Jaundice.  Psychiatric: She has a normal mood  and affect.      Data Reviewed: Basic Metabolic Panel:  Recent Labs Lab 01/23/17 0321  01/25/17 0515 01/26/17 0439 01/27/17 0538 01/28/17 0441 01/29/17 1339  NA 138  < > 147* 145 149* 146* 145  K 4.0  < > 4.1 3.3* 4.4 3.1* 3.2*  CL 106  < > 116* 113* 113* 113* 113*  CO2 23  < > _0 GLUCOSE 78  < > 122* 106* 109* 125* 77  BUN 55*  < > 46* 41* 43* 36* 26*  CREATININE 0.95  < > 0.55 0.49 0.32* 0.57 0.37*  CALCIUM 7.7*  < > 7.6* 7.8* 7.6* 7.7* 6.8*  MG 2.1  --   --   --   --   --   --   PHOS 3.8  --   --   --   --   --   --   < > = values in this interval not displayed. Liver Function Tests:  Recent Labs Lab 01/23/17 0321 01/24/17 0321 01/25/17 0515 01/27/17 0538  AST 229* 206* 213* 233*  ALT 79* 84* 96* 116*  ALKPHOS 336* 291* 255* 235*  BILITOT 9.3* 7.5* 7.8* 4.4*  PROT 4.7* 4.8* 4.9* 4.4*  ALBUMIN 1.9* 2.0* 2.0* 1.8*  Recent Labs Lab 01/26/17 0439  AMMONIA 53*   CBC:  Recent Labs Lab 01/25/17 0515 01/26/17 0439 01/27/17 0538 01/28/17 0441 01/29/17 1339  WBC 20.1* 18.1* 21.9* 17.4* 12.4*  HGB 11.6* 9.3* 10.2* 10.0* 8.6*  HCT 35.3 28.9* 32.2* 31.9* 28.1*  MCV 112.1* 112.7* 114.5* 117.6* 116.3*  PLT 128* 143* 156 202 187    CBG:  Recent Labs Lab 01/23/17 1645 01/23/17 2011 01/24/17 0402 01/24/17 0741 01/24/17 2010  GLUCAP 139* 147* 132* 121* 107*    Recent Results (from the past 240 hour(s))  MRSA PCR Screening     Status: None   Collection Time: 01/20/17 11:04 AM  Result Value Ref Range Status   MRSA by PCR NEGATIVE NEGATIVE Final    Comment:        The GeneXpert MRSA Assay (FDA approved for NASAL specimens only), is one component of a comprehensive MRSA colonization surveillance program. It is not intended to diagnose MRSA infection nor to guide or monitor treatment for MRSA infections.   CULTURE, BLOOD (ROUTINE X 2) w Reflex to ID Panel     Status: None (Preliminary result)   Collection Time: 01/26/17  4:08 PM   Result Value Ref Range Status   Specimen Description BLOOD BLOOD LEFT WRIST  Final   Special Requests   Final    BOTTLES DRAWN AEROBIC AND ANAEROBIC Blood Culture adequate volume   Culture NO GROWTH 3 DAYS  Final   Report Status PENDING  Incomplete  CULTURE, BLOOD (ROUTINE X 2) w Reflex to ID Panel     Status: None (Preliminary result)   Collection Time: 01/26/17  4:08 PM  Result Value Ref Range Status   Specimen Description BLOOD BLOOD RIGHT WRIST  Final   Special Requests   Final    BOTTLES DRAWN AEROBIC AND ANAEROBIC Blood Culture results may not be optimal due to an inadequate volume of blood received in culture bottles   Culture NO GROWTH 3 DAYS  Final   Report Status PENDING  Incomplete  Aerobic Culture (superficial specimen)     Status: None (Preliminary result)   Collection Time: 01/28/17  3:28 PM  Result Value Ref Range Status   Specimen Description WOUND  Final   Special Requests Normal  Final   Gram Stain   Final    MODERATE WBC PRESENT, PREDOMINANTLY PMN NO ORGANISMS SEEN    Culture   Final    NO GROWTH < 24 HOURS Performed at [facility], [facility] [address], [place], [place] [address]    Report Status PENDING  Incomplete     Studies: US Abdomen Complete  Result Date: 01/29/2017 CLINICAL DATA:  Elevated LFTs. EXAM: ABDOMEN ULTRASOUND COMPLETE COMPARISON:  Right upper quadrant ultrasound 01/21/2017. CT of the abdomen and pelvis 01/14/2017. FINDINGS: Gallbladder: The gallbladder wall is markedly thickened, measuring up to 8.8 mm. A 1.1 cm shadowing stone is again noted at the neck of the gallbladder. There is no sonographic Murphy sign. Layering sludge is present. Common bile duct: Diameter: 4.1 mm, within normal limits. Liver: There is diffuse fatty infiltration of liver. No discrete lesions are present. Portal vein is patent on color Doppler imaging with normal direction of blood flow towards the liver. IVC: No abnormality visualized. Pancreas: Visualized portion  unremarkable. Spleen: Size and appearance within normal limits. Maximal length is 4.8 cm. Right Kidney: Length: 10.4 cm, within normal limits. Echogenicity within normal limits. No mass or hydronephrosis visualized. Left Kidney: Length: 11.7 cm, within normal limits. Echogenicity within  normal limits. No mass or hydronephrosis visualized. Abdominal aorta: Maximal diameter is 2.7 cm. Atherosclerotic changes are noted. Other findings: Increasing diffuse abdominal ascites is present. IMPRESSION: 1. Progressive wall thickening of the gallbladder with a 1.1 cm shadowing stone at the neck of the gallbladder. Findings are concerning for developing acute cholecystitis. 2. Progressive abdominal ascites. 3. Hepatic steatosis. Electronically Signed   By: San Morelle M.D.   On: 01/29/2017 14:41   Dg Ugi W/high Density W/kub  Result Date: 01/28/2017 CLINICAL DATA:  History of gastric bypass.  Feeding tube. EXAM: UPPER GI SERIES WITH KUB TECHNIQUE: After obtaining a scout radiograph a routine upper GI series was performed using thin density FLUOROSCOPY TIME:  Fluoroscopy Time:  1 minutes 54 seconds Radiation Exposure Index (if provided by the fluoroscopic device): 15.6 Number of Acquired Spot Images: 17 COMPARISON:  Abdomen 01/22/2017. FINDINGS: Feeding tube noted with tip over the upper abdomen. The patient was not able to drink barium through a straw annulus head using a splint. The patient would only drank a small amount of barium. The esophagus and gastric remnant are patent. Proximal small bowel is faintly visualized. Feeding tube tip most likely in an enteric loop. The patient would not tolerate any more imaging for further visualization of the gastroenteric anastomosis and proximal small bowel. IMPRESSION: Very limited exam as above. Esophagus and gastric remnant appear to be patent. Feeding tube noted with tip in enteric loop. Electronically Signed   By: Marcello Moores  Register   On: 01/28/2017 12:35     Scheduled Meds: . chlorhexidine gluconate (MEDLINE KIT)  15 mL Mouth Rinse BID  . cholecalciferol  2,000 Units Oral Daily  . Copper Gluconate  1 tablet Oral Daily  . enoxaparin (LOVENOX) injection  40 mg Subcutaneous Q24H  . feeding supplement (OSMOLITE 1.5 CAL)  1,000 mL Per Tube Q1500  . feeding supplement (PRO-STAT SUGAR FREE 64)  30 mL Per Tube BID  . folic acid  1 mg Per Tube Daily  . free water  200 mL Per Tube Q4H  . furosemide  20 mg Intravenous Q12H  . ipratropium-albuterol  3 mL Nebulization Q6H  . modafinil  100 mg Oral Daily  . multivitamin  15 mL Per Tube Daily  . phytonadione  10 mg Per Tube Daily  . potassium chloride  40 mEq Per Tube Daily  . rifaximin  550 mg Per Tube BID  . sodium chloride flush  10-40 mL Intracatheter Q12H  . thiamine  100 mg Per Tube Daily  . vitamin B-12  1,000 mcg Per Tube Daily  . zinc sulfate  220 mg Per Tube Daily   Continuous Infusions: . fluconazole (DIFLUCAN) IV Stopped (01/29/17 1432)  . meropenem (MERREM) IV Stopped (01/29/17 1054)  . vancomycin 1,000 mg (01/29/17 0504)    Assessment/Plan:  1. Septic shock with Escherichia coli.  status post treatment with Cipro. WBC trending down today Ultarsound of the ABDOMEN SHOWS POSSIBLE GALLBLADDER WALL INFECTION I will ask surgery to see patient may need cholecystectomy or IR to put choleystecomy tube 2. Acute respiratory failure with hypoxia. Patient extubated 01/22/2017. Respiratory status continues to improve 3. Dysphasia with's sore throat and upper GI series shows no evidence of obstruction continue fluconazole 4. Acute encephalopathy secondary to severe sepsis.  continue Xifaxan mental status still not better,I will add Provigil to current regimen 5. Pancytopenia could be secondary to alcohol abuse and/or sepsis. Platelet count recovered 6. Hypomagnesemia, hypocalcemia and hypokalemia. Potassium replacement 7. Severe malnutrition, Anasarca and third spacing  of fluids.  Continued  tube feeds plan is to discontinue these tomorrow 8. Jaundice likely secondary to shock. Could also be secondary to prior alcohol abuse.   9. Facial trauma at home. Fractured nasal bones. Hematoma and bruising around the eyelids. 10. Anasarca continue low dose lasix 11. Physical therapy evaluation   Code Status:     Code Status Orders        Start     Ordered   01/15/17 0158  Full code  Continuous     01/15/17 0157    Code Status History    Date Active Date Inactive Code Status Order ID Comments User Context   12/13/2015 11:49 AM 12/15/2015  7:28 PM Full Code [id number]  Dereck Leep, MD Inpatient   09/17/2014  6:01 AM 09/18/2014  3:51 PM Full Code [id number]  Juluis Mire, MD Inpatient     Family Communication: spoke with husband at the bedside Disposition Plan: potential candidate for LTAC  Consultants:  Critical care specialist  Antibiotics:  Cipro  Time spent: 72 minutes  [place], [facility]

## 2017-01-29 NOTE — Progress Notes (Signed)
Decreased to 3l, will continue to monitor.

## 2017-01-29 NOTE — Progress Notes (Signed)
Nutrition Follow-up  DOCUMENTATION CODES:   Non-severe (moderate) malnutrition in context of chronic illness  INTERVENTION:  Recommend Continue Cyclic feeds from [id number]. Osmolite 1.5 at 45mL/hr, Pro-stat 16mL BID, regimen provides 1640 calories, 90gm protein, 777mL H2O daily.  Recommend do not pull NG tube for Speech evaluation tomorrow. It is unlikely patient will be able to meet needs via PO intake. In the event NG tube is removed, recommend PEG/PEJ or replace NG tube and continue cyclic tube feeds. These can be titrated down as patient's PO intake improves.   Continue liquid MVI  Continue 229mL free water Q4H, provides total 1962mL free water daily  Continue daily weights  Recommend replete K (3.2)  -Recommend 16 mg elemental zinc PO for 6 months (200% RDI), 2 mg/day IV copper for 6 days (followed by 1 mg PO daily until zinc supplementation is complete), 10000-25000 IU/day vitamin A orally/per tube for 1-2 weeks, 1-2 mg/day vitamin K orally/per tube. -Micronutrient supplementation was started on 10/15. Recommend continuing supplementation of vitamin A and vitamin K until 10/22. Continue zinc supplementation for 6 months (and appropriate dose of copper to prevent recurrence of copper deficiency - recommended ratio is 1 mg copper for each 8-15 mg elemental zinc). -Patient will need close monitoring of nutrition-related labs every 3 months.   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 Intubated on 10/10. Extubated 01/22/2017 Elevated procalcitonin now on broad-spectrum antibiotics 10/24 US showing increasing GB wall thickness and a stone in neck, possible cholecystitis per ID. May needed cholecystectomy or cholecystectomy tube.   Was NPO this morning for ultrasound. Continues with generalized edema. Will likely need tube feeds until she is able to tolerate more PO. Very weak at this time.  Labs reviewed:  K 3.2 Medications reviewed and include:  Vitamin D, Folic Acid, 54U+, J81, Zinc, Copper Gluconate,    Intake/Output Summary (Last 24 hours) at 01/29/17 1646 Last data filed at 01/29/17 0748  Gross per 24 hour  Intake             1920 ml  Output             1600 ml  Net              320 ml  1514mL UOP yesterday 15L fluid positive  Diet Order:  Diet full liquid Room service appropriate? Yes; Fluid consistency: Nectar Thick  Skin:  Wound (see comment) (Stg I buttocks, weeping to arms and legs, cellulitis lower legs, blisters arms and legs, skin tear left leg)  Last BM:  01/28/2017 (type 7)  Height:   Ht Readings from Last 1 Encounters:  01/14/17 5'  6" (1.676 m)    Weight:   Wt Readings from Last 1 Encounters:  01/29/17 147 lb (66.7 kg)    Ideal Body Weight:  59.1 kg  BMI:  Body mass index is 23.73 kg/m.  Estimated Nutritional Needs:   Kcal:  1475-1700 (MSJ x 1.3-1.5)  Protein:  85-100 grams (1.4-1.6 grams/kg)  Fluid:  1.5-1.8 L/day (25-30 ml/kg)  EDUCATION NEEDS:   Education needs  addressed  Sonya Park. Sonya Rankin, MS, RD LDN Inpatient Clinical Dietitian Pager [phone]]

## 2017-01-30 DIAGNOSIS — E43 Unspecified severe protein-calorie malnutrition: Secondary | ICD-10-CM

## 2017-01-30 DIAGNOSIS — R7989 Other specified abnormal findings of blood chemistry: Secondary | ICD-10-CM

## 2017-01-30 DIAGNOSIS — R627 Adult failure to thrive: Secondary | ICD-10-CM

## 2017-01-30 DIAGNOSIS — R945 Abnormal results of liver function studies: Secondary | ICD-10-CM

## 2017-01-30 LAB — BLOOD GAS, ARTERIAL
Acid-Base Excess: UNDETERMINED mmol/L (ref 0.0–2.0)
BICARBONATE: UNDETERMINED mmol/L (ref 20.0–28.0)
FIO2: 0.28
O2 Saturation: UNDETERMINED %
PATIENT TEMPERATURE: 37
PH ART: 7.25 — AB (ref 7.350–7.450)
pCO2 arterial: 19 mmHg — CL (ref 32.0–48.0)
pO2, Arterial: 90 mmHg (ref 83.0–108.0)

## 2017-01-30 LAB — CBC
HEMATOCRIT: 28.5 % — AB (ref 35.0–47.0)
Hemoglobin: 8.9 g/dL — ABNORMAL LOW (ref 12.0–16.0)
MCH: 36.5 pg — ABNORMAL HIGH (ref 26.0–34.0)
MCHC: 31.3 g/dL — AB (ref 32.0–36.0)
MCV: 116.5 fL — AB (ref 80.0–100.0)
Platelets: 183 10*3/uL (ref 150–440)
RBC: 2.45 MIL/uL — ABNORMAL LOW (ref 3.80–5.20)
RDW: 18 % — ABNORMAL HIGH (ref 11.5–14.5)
WBC: 11.2 10*3/uL — ABNORMAL HIGH (ref 3.6–11.0)

## 2017-01-30 LAB — BASIC METABOLIC PANEL
ANION GAP: 4 — AB (ref 5–15)
BUN: 29 mg/dL — ABNORMAL HIGH (ref 6–20)
CHLORIDE: 109 mmol/L (ref 101–111)
CO2: 30 mmol/L (ref 22–32)
CREATININE: 0.34 mg/dL — AB (ref 0.44–1.00)
Calcium: 7.4 mg/dL — ABNORMAL LOW (ref 8.9–10.3)
GFR calc non Af Amer: >60 mL/min (ref >60)
Glucose, Bld: 109 mg/dL — ABNORMAL HIGH (ref 65–99)
Potassium: 3.4 mmol/L — ABNORMAL LOW (ref 3.5–5.1)
Sodium: 143 mmol/L (ref 135–145)

## 2017-01-30 LAB — VANCOMYCIN, TROUGH: Vancomycin Tr: 11 ug/mL — ABNORMAL LOW (ref 15–20)

## 2017-01-30 LAB — PROTIME-INR
INR: 1
PROTHROMBIN TIME: 13.1 s (ref 11.4–15.2)

## 2017-01-30 MED ORDER — POTASSIUM CHLORIDE 20 MEQ/15ML (10%) PO SOLN
40.0000 meq | Freq: Every day | ORAL | Status: DC
  Filled 2017-01-30: qty 30

## 2017-01-30 MED ORDER — POTASSIUM CHLORIDE 20 MEQ/15ML (10%) PO SOLN
40.0000 meq | Freq: Every day | ORAL | Status: DC
  Administered 2017-01-30 – 2017-02-03 (×5): 40 meq via ORAL
  Filled 2017-01-30 (×7): qty 30

## 2017-01-30 MED ORDER — VANCOMYCIN HCL 10 G IV SOLR
1250.0000 mg | INTRAVENOUS | Status: DC
  Administered 2017-01-30 – 2017-01-31 (×2): 1250 mg via INTRAVENOUS
  Filled 2017-01-30 (×2): qty 1250

## 2017-01-30 MED ORDER — ALBUMIN HUMAN 5 % IV SOLN
25.0000 g | Freq: Four times a day (QID) | INTRAVENOUS | Status: DC
  Administered 2017-01-31: 25 g via INTRAVENOUS
  Filled 2017-01-30 (×4): qty 500

## 2017-01-30 NOTE — Progress Notes (Signed)
SLP Cancellation Note  Patient Details Name: Sonya Park MRN: [id number] DOB: [birth year]   Cancelled treatment:       Reason Eval/Treat Not Completed: Patient not medically ready (chart reviewed; consulted MD(Davis); NSG) Pt continues w/ NG in place for nutritional support; noted pt's declined medical status including recent discussion re: hepatic issues. Pt is on a modified diet but is able to take some po's including puddings, applesauce. Concern for increasing texture of solid foods when pt's Esophageal dysmotility was an issue for her prior to admission per Husband's report, and pt still has an NG in place impacting pharyngeal-esophageal pathway. Will f/u w/ MD for goals of care tomorrow as MD(Davis) suggested need for more discussion re: overall goals of care. NSG updated and agreed.    Orinda Kenner, [place], CCC-SLP Sonya Park 01/30/2017, 3:14 PM

## 2017-01-30 NOTE — Care Management (Signed)
[facility] representative currently meeting with patient's husband

## 2017-01-30 NOTE — Progress Notes (Signed)
Pt voided 100 ml throughout night with external catheter. Pt bladder scanned with reading greater than 999. Primary nurse paged and spoke to Dr. Estanislado Pandy. Orders received for in and out cath. Primary nurse to continue to monitor.

## 2017-01-30 NOTE — Progress Notes (Signed)
SURGICAL PROGRESS NOTE (cpt [phone])  Hospital Day(s): 16.   Post op day(s):  Marland Kitchen   Interval History: Patient seen and examined, no acute events or new complaints overnight. Though patient able to whisper a few words and nod yes or shake head no, most interval history obtained from patient's husband. Patient continues to deny abdominal pain. Patient's husband requests to speak with liver disease specialist and palliative care regarding management of liver failure and goals of care. Patient and her husband together confirm patient has been increasingly unsteady with a shaking tremor and bruises easily x several months.  Review of Systems:  Constitutional: denies fever, chills  HEENT: denies cough or congestion  Respiratory: denies any shortness of breath  Cardiovascular: denies chest pain or palpitations  Gastrointestinal: denies abdominal pain or N/V, tolerating TF's, +loose BM's Genitourinary: denies burning with urination or urinary frequency Musculoskeletal: denies pain, decreased motor or sensation Integumentary: denies any other rashes or skin discolorations except facial ecchymoses Neurological: denies HA or vision/hearing changes   Vital signs in last 24 hours: [min-max] current  Temp:  [98.3 F (36.8 C)-98.6 F (37 C)] 98.4 F (36.9 C) (10/25 0446) Pulse Rate:  [99-107] 101 (10/25 0447) Resp:  [14-20] 20 (10/25 0446) BP: (118-144)/(73-88) 127/84 (10/25 0446) SpO2:  [95 %-100 %] 100 % (10/25 0447) Weight:  [142 lb (64.4 kg)] 142 lb (64.4 kg) (10/25 0447)     Height: 5\' 6"  (167.6 cm) Weight: 142 lb (64.4 kg) BMI (Calculated): 22.93   Intake/Output this shift:  No intake/output data recorded.   Intake/Output last 2 shifts:  @IOLAST2SHIFTS @   Physical Exam:  Constitutional: alert, cooperative and no distress  HENT: normocephalic with extensive facial ecchymoses Eyes: +scleral icterus, PERRL, EOM's grossly intact and symmetric Neuro: CN II - XII grossly intact and symmetric  without deficit  Respiratory: breathing non-labored at rest  Cardiovascular: regular rate and sinus rhythm  Gastrointestinal: soft, completely non-tender, and non-distended Musculoskeletal: UE mostly FROM, LE weak (unable to bend legs without assist), hands and feet warm/pink, sensation grossly intact, NT  Labs:  CBC Latest Ref Rng & Units 01/30/2017 01/29/2017 01/28/2017  WBC 3.6 - 11.0 K/uL 11.2(H) 12.4(H) 17.4(H)  Hemoglobin 12.0 - 16.0 g/dL 8.9(L) 8.6(L) 10.0(L)  Hematocrit 35.0 - 47.0 % 28.5(L) 28.1(L) 31.9(L)  Platelets 150 - 440 K/uL 183 187 202   CMP Latest Ref Rng & Units 01/30/2017 01/29/2017 01/28/2017  Glucose 65 - 99 mg/dL 109(H) 77 125(H)  BUN 6 - 20 mg/dL 29(H) 26(H) 36(H)  Creatinine 0.44 - 1.00 mg/dL 0.34(L) 0.37(L) 0.57  Sodium 135 - 145 mmol/L 143 145 146(H)  Potassium 3.5 - 5.1 mmol/L 3.4(L) 3.2(L) 3.1(L)  Chloride 101 - 111 mmol/L 109 113(H) 113(H)  CO2 22 - 32 mmol/L 30 27 28   Calcium 8.9 - 10.3 mg/dL 7.4(L) 6.8(L) 7.7(L)  Total Protein 6.5 - 8.1 g/dL - 3.9(L) -  Total Bilirubin 0.3 - 1.2 mg/dL - 2.2(H) -  Alkaline Phos 38 - 126 U/L - 167(H) -  AST 15 - 41 U/L - 126(H) -  ALT 14 - 54 U/L - 92(H) -   Imaging studies: No new pertinent imaging studies   Assessment/Plan: (ICD-10's: K70.4) 76 y.o. 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              - IV antibiotics              - GI consultation for hepatic management             - both cholecystectomy and cholecystostomy carry elevated risks for this patient  and appear to be of little benefit with no abdominal pain, decreasing WBC, and liver failure             - alcohol cessation, avoid hepatotoxic medications such as acetominophen             - medical management and supportive care per primary team             - HIDA currently seems unlikely to alter management  - palliative care consult pending  All of the above findings and recommendations were discussed with the patient and her family, and all of patient's and her family's questions were answered to her expressed satisfaction.  Thank you for the opportunity to participate in this patient's care.  -- Marilynne Drivers Rosana Hoes, MD, [place]: [facility] - Partnering for exceptional care. Office: [phone]

## 2017-01-30 NOTE — Care Management (Signed)
Notified by Doroteo Bradford from [facility] that patient's husband would not be able to store his RV at the facility.  RNCM updated husband with this information

## 2017-01-30 NOTE — Progress Notes (Signed)
Patient ID: Sonya Park, female   DOB: 1941-02-06, 76 y.o.   MRN: [id number]   [facility] PROGRESS NOTE  Sonya Park RKY:[id number] DOB: [birth year] DOA: 01/14/2017 PCP: Cletis Athens, MD  HPI/Subjective: Continues to be drowsy  Objective: Vitals:   01/30/17 0447 01/30/17 1158  BP:  128/61  Pulse: (!) 101 96  Resp:    Temp:  98 F (36.7 C)  SpO2: 100% 99%    Filed Weights   01/28/17 0332 01/29/17 0433 01/30/17 0447  Weight: 142 lb (64.4 kg) 147 lb (66.7 kg) 142 lb (64.4 kg)    ROS: Review of Systems  Unable to perform ROS: Mental status change  Constitutional: Negative for fever.   Exam: Physical Exam  HENT:  Nose: No mucosal edema.  Mouth/Throat: No oropharyngeal exudate.  Eyes: Pupils are equal, round, and reactive to light. Lids are normal.  Icteric.   Neck: Carotid bruit is not present. No thyromegaly present.  Cardiovascular: Regular rhythm, S1 normal and S2 normal.   Respiratory: She has decreased breath sounds in the right lower field and the left lower field. She has no wheezes. She has no rhonchi. She has no rales.  GI: Soft. Bowel sounds are normal. There is no tenderness.  Musculoskeletal:       Right elbow: She exhibits no swelling.       Left elbow: She exhibits no swelling.       Right wrist: She exhibits no swelling.       Left wrist: She exhibits no swelling.       Right knee: She exhibits no swelling.       Left knee: She exhibits no swelling.       Right ankle: She exhibits no swelling.  Lymphadenopathy:    She has no cervical adenopathy.  Neurological: She is alert.  Patient barely able to straight leg raise. Patient able to lift arms up off the bed  Skin:  Dressing in place  Psychiatric: She has a normal mood and affect.      Data Reviewed: Basic Metabolic Panel:  Recent Labs Lab 01/26/17 0439 01/27/17 0538 01/28/17 0441 01/29/17 1339 01/30/17 0602  NA 145 149* 146* 145 143  K 3.3* 4.4 3.1* 3.2* 3.4*  CL 113* 113*  113* 113* 109  CO2 _0 GLUCOSE 106* 109* 125* 77 109*  BUN 41* 43* 36* 26* 29*  CREATININE 0.49 0.32* 0.57 0.37* 0.34*  CALCIUM 7.8* 7.6* 7.7* 6.8* 7.4*   Liver Function Tests:  Recent Labs Lab 01/24/17 0321 01/25/17 0515 01/27/17 0538 01/29/17 1339  AST 206* 213* 233* 126*  ALT 84* 96* 116* 92*  ALKPHOS 291* 255* 235* 167*  BILITOT 7.5* 7.8* 4.4* 2.2*  PROT 4.8* 4.9* 4.4* 3.9*  ALBUMIN 2.0* 2.0* 1.8* 1.5*    Recent Labs Lab 01/26/17 0439  AMMONIA 53*   CBC:  Recent Labs Lab 01/26/17 0439 01/27/17 0538 01/28/17 0441 01/29/17 1339 01/30/17 0602  WBC 18.1* 21.9* 17.4* 12.4* 11.2*  HGB 9.3* 10.2* 10.0* 8.6* 8.9*  HCT 28.9* 32.2* 31.9* 28.1* 28.5*  MCV 112.7* 114.5* 117.6* 116.3* 116.5*  PLT 143* 156 202 187 183    CBG:  Recent Labs Lab 01/23/17 1645 01/23/17 2011 01/24/17 0402 01/24/17 0741 01/24/17 2010  GLUCAP 139* 147* 132* 121* 107*    Recent Results (from the past 240 hour(s))  CULTURE, BLOOD (ROUTINE X 2) w Reflex to ID Panel     Status: None (Preliminary result)  Collection Time: 01/26/17  4:08 PM  Result Value Ref Range Status   Specimen Description BLOOD BLOOD LEFT WRIST  Final   Special Requests   Final    BOTTLES DRAWN AEROBIC AND ANAEROBIC Blood Culture adequate volume   Culture NO GROWTH 4 DAYS  Final   Report Status PENDING  Incomplete  CULTURE, BLOOD (ROUTINE X 2) w Reflex to ID Panel     Status: None (Preliminary result)   Collection Time: 01/26/17  4:08 PM  Result Value Ref Range Status   Specimen Description BLOOD BLOOD RIGHT WRIST  Final   Special Requests   Final    BOTTLES DRAWN AEROBIC AND ANAEROBIC Blood Culture results may not be optimal due to an inadequate volume of blood received in culture bottles   Culture NO GROWTH 4 DAYS  Final   Report Status PENDING  Incomplete  Aerobic Culture (superficial specimen)     Status: None (Preliminary result)   Collection Time: 01/28/17  3:28 PM  Result Value Ref Range  Status   Specimen Description WOUND  Final   Special Requests Normal  Final   Gram Stain   Final    MODERATE WBC PRESENT, PREDOMINANTLY PMN NO ORGANISMS SEEN    Culture   Final    NO GROWTH 2 DAYS Performed at [facility], [place] [address], [place], [place] [address]    Report Status PENDING  Incomplete     Studies: US Abdomen Complete  Result Date: 01/29/2017 CLINICAL DATA:  Elevated LFTs. EXAM: ABDOMEN ULTRASOUND COMPLETE COMPARISON:  Right upper quadrant ultrasound 01/21/2017. CT of the abdomen and pelvis 01/14/2017. FINDINGS: Gallbladder: The gallbladder wall is markedly thickened, measuring up to 8.8 mm. A 1.1 cm shadowing stone is again noted at the neck of the gallbladder. There is no sonographic Murphy sign. Layering sludge is present. Common bile duct: Diameter: 4.1 mm, within normal limits. Liver: There is diffuse fatty infiltration of liver. No discrete lesions are present. Portal vein is patent on color Doppler imaging with normal direction of blood flow towards the liver. IVC: No abnormality visualized. Pancreas: Visualized portion unremarkable. Spleen: Size and appearance within normal limits. Maximal length is 4.8 cm. Right Kidney: Length: 10.4 cm, within normal limits. Echogenicity within normal limits. No mass or hydronephrosis visualized. Left Kidney: Length: 11.7 cm, within normal limits. Echogenicity within normal limits. No mass or hydronephrosis visualized. Abdominal aorta: Maximal diameter is 2.7 cm. Atherosclerotic changes are noted. Other findings: Increasing diffuse abdominal ascites is present. IMPRESSION: 1. Progressive wall thickening of the gallbladder with a 1.1 cm shadowing stone at the neck of the gallbladder. Findings are concerning for developing acute cholecystitis. 2. Progressive abdominal ascites. 3. Hepatic steatosis. Electronically Signed   By: San Morelle M.D.   On: 01/29/2017 14:41    Scheduled Meds: . chlorhexidine gluconate (MEDLINE KIT)   15 mL Mouth Rinse BID  . cholecalciferol  2,000 Units Oral Daily  . Copper Gluconate  1 tablet Oral Daily  . enoxaparin (LOVENOX) injection  40 mg Subcutaneous Q24H  . feeding supplement (OSMOLITE 1.5 CAL)  1,000 mL Per Tube Q1500  . feeding supplement (PRO-STAT SUGAR FREE 64)  30 mL Per Tube BID  . folic acid  1 mg Per Tube Daily  . free water  200 mL Per Tube Q4H  . furosemide  20 mg Intravenous Q12H  . ipratropium-albuterol  3 mL Nebulization TID  . modafinil  100 mg Oral Daily  . multivitamin  15 mL Per Tube Daily  .  phytonadione  10 mg Per Tube Daily  . potassium chloride  40 mEq Oral Daily  . rifaximin  550 mg Per Tube BID  . sodium chloride flush  10-40 mL Intracatheter Q12H  . thiamine  100 mg Per Tube Daily  . vitamin B-12  1,000 mcg Per Tube Daily  . zinc sulfate  220 mg Per Tube Daily   Continuous Infusions: . fluconazole (DIFLUCAN) IV Stopped (01/29/17 1432)  . meropenem (MERREM) IV Stopped (01/30/17 0913)  . vancomycin Stopped (01/30/17 0110)    Assessment/Plan:  1. Septic shock with Escherichia coli.  status post treatment with Cipro. WBC trending down today 2. Ultarsound of the ABDOMEN SHOWS POSSIBLE GALLBLADDER WALL INFECTION-seen by surgery they feel this is related to anasarca and not gallbladder infection,  3. Acute respiratory failure with hypoxia. Patient extubated 01/22/2017. Respiratory status continues to improve 4. Dysphasia with's sore throat and upper GI series shows no evidence of obstruction continue fluconazole, I discussed with radiology regarding PEG tube placement with her previous surgery for gastric bypass and ascites she is not a candidate for any type of PEG tube placement 5. Acute encephalopathy secondary to severe sepsis.  continue Xifaxan mental status still waxing and waning continue Provigil 6. Pancytopenia could be secondary to alcohol abuse and/or sepsis. Platelet count recovered 7. Hypomagnesemia, hypocalcemia and hypokalemia. Potassium  replacement 8. Severe malnutrition, Anasarca and third spacing of fluids.  Continued tube feeds plan is to discontinue these tomorrow 9. Jaundice likely secondary to shock. Could also be secondary to prior alcohol abuse.   10. Facial trauma at home. Fractured nasal bones. Hematoma and bruising around the eyelids. 11. Anasarca continue low dose lasix 12. Prognosis poor discussed with the husband palliative care consult   Code Status:     Code Status Orders        Start     Ordered   01/15/17 0158  Full code  Continuous     01/15/17 0157    Code Status History    Date Active Date Inactive Code Status Order ID Comments User Context   12/13/2015 11:49 AM 12/15/2015  7:28 PM Full Code [id number]  Dereck Leep, MD Inpatient   09/17/2014  6:01 AM 09/18/2014  3:51 PM Full Code [id number]  Juluis Mire, MD Inpatient     Family Communication: spoke with husband at the bedside Disposition Plan: potential candidate for LTAC  Consultants:  Critical care specialist  Antibiotics:  Cipro  Time spent: 9 minutes  [place], [facility]

## 2017-01-30 NOTE — Progress Notes (Signed)
Pt has not voided since she received an in and out cath at 0710 this morning. RN bladder scanned pt and received 664ml. Prime doctor notified. New order to place a foley in at this time. Will continue to monitor.   Retal Tonkinson [employer]

## 2017-01-30 NOTE — Progress Notes (Signed)
Daily Progress Note   Patient Name: Sonya Park       Date: 01/30/2017 DOB: March 12, 1941  Age: 75 y.o. MRN#: [id number] Attending Physician: Sonya Flock, MD Primary Care Physician: Sonya Athens, MD Admit Date: 01/14/2017  Reason for Consultation/Follow-up: Establishing goals of care  Subjective/GOC: Initially met with husband, Sonya Park in hallway. Known to me from previous palliative consultation when she was in ICU. Notes reviewed. Sonya Park is on his way to lunch but speaks briefly with me. Again introduced palliative care. We discussed course of hospitalization and guarded prognosis due to multiple factors including severe deconditioning and malnutrition. Sonya Park has spoken with Dr. 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   Sonya Park speaks of his strong faith in God that she will improve. He also states "maybe I'm being too optimistic." He plans to visit LTACH facilities and for her to be placed there to "build strength." He is hopeful that she can return home after. He feels if he prevents her from drinking, she may have more time. Sonya Park also speaks of allowing her to have wine if she requests, knowing this would make her happy at the end of life. Briefly discussed comfort focused care with aim at comfort, quality and dignity at EOL.   Sonya Park is glad his wife is awake, alert, and able to participate in [name] conversation but requesting to meet with the team on Monday, after he privately talks with her this weekend. Provided a MOST form and discussed decisions they are faced with including resuscitation/life support. Recommended DNR/DNI. Hard Choices copy given.   **Visited with patient at bedside. She is awake, alert, and oriented. Introduced  palliative care. Denies pain or discomfort. She attempting to urinate on bedpan but complains of retention. RN notified and will discuss with Dr. Posey Pronto. She required I/O cath this AM.   Length of Stay: 16  Current Medications: Scheduled Meds:  . chlorhexidine gluconate (MEDLINE KIT)  15 mL Mouth Rinse BID  . cholecalciferol  2,000 Units Oral Daily  . Copper Gluconate  1 tablet Oral Daily  . enoxaparin (LOVENOX) injection  40 mg Subcutaneous Q24H  . feeding supplement (OSMOLITE 1.5 CAL)  1,000 mL Per Tube Q1500  . feeding supplement (PRO-STAT SUGAR FREE 64)  30 mL Per Tube BID  .  folic acid  1 mg Per Tube Daily  . free water  200 mL Per Tube Q4H  . furosemide  20 mg Intravenous Q12H  . ipratropium-albuterol  3 mL Nebulization TID  . modafinil  100 mg Oral Daily  . multivitamin  15 mL Per Tube Daily  . phytonadione  10 mg Per Tube Daily  . potassium chloride  40 mEq Oral Daily  . rifaximin  550 mg Per Tube BID  . sodium chloride flush  10-40 mL Intracatheter Q12H  . thiamine  100 mg Per Tube Daily  . vitamin B-12  1,000 mcg Per Tube Daily  . zinc sulfate  220 mg Per Tube Daily    Continuous Infusions: . albumin human    . fluconazole (DIFLUCAN) IV 200 mg (01/30/17 1542)  . meropenem (MERREM) IV Stopped (01/30/17 0913)  . vancomycin Stopped (01/30/17 0110)    PRN Meds: [DISCONTINUED] ondansetron **OR** ondansetron (ZOFRAN) IV, sodium chloride flush, traMADol  Physical Exam  Constitutional: She is oriented to person, place, and time. She is cooperative. She appears ill.  HENT:  Head: Normocephalic and atraumatic.  Cardiovascular: Regular rhythm.   Pulmonary/Chest: Effort normal and breath sounds normal.  Abdominal: Bowel sounds are normal. She exhibits distension. There is no tenderness.  Neurological: She is alert and oriented to person, place, and time.  Skin: Skin is warm and dry. Ecchymosis noted.  Psychiatric: She has a normal mood and affect. Her speech is normal.  Cognition and memory are normal.  Nursing note and vitals reviewed.          Vital Signs: BP 128/61 (BP Location: Right Leg)   Pulse 96   Temp 98 F (36.7 C) (Axillary)   Resp 20   Ht _0  (1.676 m)   Wt 64.4 kg (142 lb)   SpO2 99%   BMI 22.92 kg/m  SpO2: SpO2: 99 % O2 Device: O2 Device: Nasal Cannula O2 Flow Rate: O2 Flow Rate (L/min): 2 L/min  Intake/output summary:   Intake/Output Summary (Last 24 hours) at 01/30/17 1651 Last data filed at 01/30/17 1000  Gross per 24 hour  Intake             4168 ml  Output             3100 ml  Net             1068 ml   LBM: Last BM Date: 01/29/17 Baseline Weight: Weight: 63 kg (139 lb) Most recent weight: Weight: 64.4 kg (142 lb)       Palliative Assessment/Data: PPS 30%   Flowsheet Rows     Most Recent Value  Intake Tab  Referral Department  Hospitalist  Unit at Time of Referral  Med/Surg Unit  Palliative Care Primary Diagnosis  Sepsis/Infectious Disease  Palliative Care Type  Return patient Palliative Care  Reason for referral  Clarify Goals of Care  Date first seen by Palliative Care  01/17/17  Clinical Assessment  Palliative Performance Scale Score  30%  Psychosocial & Spiritual Assessment  Palliative Care Outcomes  Patient/Family meeting held?  Yes  Who was at the meeting?  patient and husband  Palliative Care Outcomes  Clarified goals of care, Counseled regarding hospice, Provided end of life care assistance, Provided psychosocial or spiritual support, ACP counseling assistance      Patient Active Problem List   Diagnosis Date Noted  . Hyperbilirubinemia   . Alcoholic liver disease ([place])   . Thrombocytopenia ([place])   . Anasarca   .  Pressure injury of skin 01/19/2017  . Acute respiratory failure ([place])   . Bacteremia   . Palliative care by specialist   . Goals of care, counseling/discussion   . Alcohol abuse 01/15/2017  . Sepsis ([place]) 01/14/2017  . Facial trauma 01/14/2017  . Osteoporosis 01/14/2017  .  Neutropenia ([place]) 01/14/2017  . 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 Special screening for malignant neoplasms, colon   . Benign neoplasm of transverse colon   . Fracture of right inferior pubic ramus ([place]) 09/17/2014  . Compression fracture of L3 lumbar vertebra ([facility]) 09/17/2014    Palliative Care Assessment & Plan   Patient Profile: 76 y.o. female  with past medical history of osteoporosis, arthritis, insomnia, back pain, bilateral knee replacements, gastric bypass, and ETOH abuse admitted on 01/14/2017 after fall on 01/13/17. She went to PCP on 10/9 and CT maxillofacial revealed bilateral bone fractures without significant displacement, left frontal hemorrhage suggestive of occult fracture, and extensive bilateral periorbital soft swelling and hematoma with inflammatory changes over bilateral maxilla. In ED, lab results revealed WBC 1.9 and lactic acid of 6.7. Patient also hypotensive. Admitted to ICU for sepsis. On 10/10, patient intubated secondary to severe metabolic acidosis and encephalopathy. Husband pursuing FULL scope treatment including trach/peg if necessary. Palliative re-consulted on 10/25 for failure to thrive. 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 Patient is not a candidate for surgical interventions. Also not a candidate for PEG tube placement secondary to history of gastric bypass. 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   Assessment: Septic shock with E. Coli Acute respiratory failure with hypoxia Dysphagia Severe malnutrition Acute encephalopathy Pancytopenia Hypomagnesemia Anasarca Gallbladder wall thickening secondary to ETOH hepatic failure   Recommendations/Plan:  Continue FULL code/FULL scope  Husband pursuing LTACH placement. Remains  hopeful to get her home.   Further GOC discussions pending. Husband requests to discuss with the patient this weekend and have palliative f/u meeting on Monday, 10/29. MOST form given to review.   Goals of Care and Additional Recommendations:  Limitations on Scope of Treatment: Full Scope Treatment  Code Status: FULL   Code Status Orders        Start     Ordered   01/15/17 0158  Full code  Continuous     01/15/17 0157    Code Status History    Date Active Date Inactive Code Status Order ID Comments User Context   12/13/2015 11:49 AM 12/15/2015  7:28 PM Full Code [id number]  Dereck Leep, MD Inpatient   09/17/2014  6:01 AM 09/18/2014  3:51 PM Full Code [id number]  Juluis Mire, MD Inpatient       Prognosis:   Poor prognosis: eligible for hospice services  Discharge Planning:  To Be Determined  Care plan was discussed with patient, husband Sonya Park), RN, SLP, and Dr. Rosana Hoes  Thank you for allowing the Palliative Medicine Team to assist in the care of this patient.   Time In: 1510 Time Out: 1640 Total Time 25mn Prolonged Time Billed  yes      Greater than 50%  of this time was spent counseling and coordinating care related to the above assessment and plan.  MIhor Dow FNP-C Palliative Medicine Team  Phone: [phone]: [phone] Please contact Palliative Medicine Team phone at [phone] questions and concerns.

## 2017-01-30 NOTE — Progress Notes (Signed)
Pharmacy Antibiotic Note  Sonya Park is a 76 y.o. female with recent treatment for E coli bacteremia with Cipro who continues to have elevated WBC and PCT.  Pharmacy has been consulted for vancomycin and meropenem dosing.  Plan: Ke= 0.052 h-1 Vd= 41.1 L, T1/2 13.5 hr  Vancomycin 1000 mg iv once then vancomycin 1000 IV every 18 hours with stacked dosing and a trough with the 4th dose.  Predicted trough 16 mcg/mL. Goal trough 15-20 mcg/mL.  Meropenem 1 g iv q 8 hours.   10/25: Vancomycin trough= 11 mcg/ml. Will adjust dose to 1250mg  q18h. Check trough 10/28.    Height: 5\' 6"  (167.6 cm) Weight: 142 lb (64.4 kg) IBW/kg (Calculated) : 59.3  Temp (24hrs), Avg:98.4 F (36.9 C), Min:98 F (36.7 C), Max:98.6 F (37 C)   Recent Labs Lab 01/26/17 0439 01/27/17 0538 01/28/17 0441 01/29/17 1339 01/30/17 0602 01/30/17 1700  WBC 18.1* 21.9* 17.4* 12.4* 11.2*  --   CREATININE 0.49 0.32* 0.57 0.37* 0.34*  --   VANCOTROUGH  --   --   --   --   --  11*    Estimated Creatinine Clearance: 56.9 mL/min (A) (by C-G formula based on SCr of 0.34 mg/dL (L)).    Allergies  Allergen Reactions  . Other Itching    "mycin" doesn't know which one  . Penicillins Swelling    Has patient had a PCN reaction causing immediate rash, facial/tongue/throat swelling, SOB or lightheadedness with hypotension: Yes Has patient had a PCN reaction causing severe rash involving mucus membranes or skin necrosis: No Has patient had a PCN reaction that required hospitalization: No Has patient had a PCN reaction occurring within the last 10 years: No If all of the above answers are "NO", then may proceed with Cephalosporin use.    Antimicrobials this admission: S/P ciprofloxacin 10/12 >> 10/18 Meropenem 10/22 >>  Vancomycin 10/22 >>   Dose adjustments this admission: 10/25 VancT= 11 mcg/ml:  Vanc 1 gram q18h  to 1250mg  q18h  Microbiology results: 10/9 BCx: E coli 10/21 BCx: NGTD 10/15 MRSA PCR:  negative  Thank you for allowing pharmacy to be a part of this patient's care.  Aveion Nguyen A 01/30/2017 5:48 PM

## 2017-01-31 LAB — CBC WITH DIFFERENTIAL/PLATELET
BASOS ABS: 0 10*3/uL (ref 0–0.1)
BASOS PCT: 1 %
Eosinophils Absolute: 0.1 10*3/uL (ref 0–0.7)
Eosinophils Relative: 1 %
HEMATOCRIT: 22.8 % — AB (ref 35.0–47.0)
Hemoglobin: 7.4 g/dL — ABNORMAL LOW (ref 12.0–16.0)
LYMPHS PCT: 8 %
Lymphs Abs: 0.6 10*3/uL — ABNORMAL LOW (ref 1.0–3.6)
MCH: 37.3 pg — ABNORMAL HIGH (ref 26.0–34.0)
MCHC: 32.5 g/dL (ref 32.0–36.0)
MCV: 114.7 fL — AB (ref 80.0–100.0)
MONO ABS: 0.6 10*3/uL (ref 0.2–0.9)
Monocytes Relative: 8 %
NEUTROS ABS: 5.9 10*3/uL (ref 1.4–6.5)
Neutrophils Relative %: 82 %
PLATELETS: 131 10*3/uL — AB (ref 150–440)
RBC: 1.99 MIL/uL — AB (ref 3.80–5.20)
RDW: 16.9 % — AB (ref 11.5–14.5)
WBC: 7.2 10*3/uL (ref 3.6–11.0)

## 2017-01-31 LAB — BASIC METABOLIC PANEL
ANION GAP: 8 (ref 5–15)
BUN: 23 mg/dL — ABNORMAL HIGH (ref 6–20)
CALCIUM: 7.5 mg/dL — AB (ref 8.9–10.3)
CO2: 27 mmol/L (ref 22–32)
Chloride: 105 mmol/L (ref 101–111)
Creatinine, Ser: <0.3 mg/dL — ABNORMAL LOW (ref 0.44–1.00)
GLUCOSE: 93 mg/dL (ref 65–99)
POTASSIUM: 3.8 mmol/L (ref 3.5–5.1)
Sodium: 140 mmol/L (ref 135–145)

## 2017-01-31 LAB — CULTURE, BLOOD (ROUTINE X 2)
Culture: NO GROWTH
Culture: NO GROWTH
Special Requests: ADEQUATE

## 2017-01-31 MED ORDER — ENSURE ENLIVE PO LIQD
237.0000 mL | Freq: Two times a day (BID) | ORAL | Status: DC
  Administered 2017-02-01 – 2017-02-03 (×6): 237 mL via ORAL

## 2017-01-31 MED ORDER — VITAMIN B-1 100 MG PO TABS
100.0000 mg | ORAL_TABLET | Freq: Every day | ORAL | Status: DC
  Administered 2017-02-01 – 2017-02-11 (×11): 100 mg via ORAL
  Filled 2017-01-31 (×11): qty 1

## 2017-01-31 MED ORDER — BACITRACIN ZINC 500 UNIT/GM EX OINT
1.0000 "application " | TOPICAL_OINTMENT | Freq: Two times a day (BID) | CUTANEOUS | Status: DC
  Administered 2017-01-31 – 2017-02-08 (×16): 1 via TOPICAL
  Filled 2017-01-31 (×7): qty 0.9

## 2017-01-31 MED ORDER — RIFAXIMIN 550 MG PO TABS
550.0000 mg | ORAL_TABLET | Freq: Two times a day (BID) | ORAL | Status: DC
  Administered 2017-01-31 – 2017-02-11 (×22): 550 mg via ORAL
  Filled 2017-01-31 (×23): qty 1

## 2017-01-31 MED ORDER — FOLIC ACID 1 MG PO TABS
1.0000 mg | ORAL_TABLET | Freq: Every day | ORAL | Status: DC
  Administered 2017-02-01 – 2017-02-11 (×11): 1 mg via ORAL
  Filled 2017-01-31 (×11): qty 1

## 2017-01-31 MED ORDER — PHYTONADIONE 5 MG PO TABS
10.0000 mg | ORAL_TABLET | Freq: Every day | ORAL | Status: AC
  Administered 2017-02-01 – 2017-02-02 (×2): 10 mg via ORAL
  Filled 2017-01-31 (×2): qty 2

## 2017-01-31 MED ORDER — CIPROFLOXACIN HCL 500 MG PO TABS
500.0000 mg | ORAL_TABLET | Freq: Two times a day (BID) | ORAL | Status: AC
  Administered 2017-01-31 – 2017-02-03 (×7): 500 mg via ORAL
  Filled 2017-01-31 (×7): qty 1

## 2017-01-31 MED ORDER — ADULT MULTIVITAMIN LIQUID CH
15.0000 mL | Freq: Every day | ORAL | Status: DC
  Administered 2017-02-01 – 2017-02-10 (×9): 15 mL via ORAL
  Filled 2017-01-31 (×11): qty 15

## 2017-01-31 MED ORDER — TRAMADOL HCL 50 MG PO TABS
50.0000 mg | ORAL_TABLET | Freq: Four times a day (QID) | ORAL | Status: DC | PRN
  Administered 2017-02-03 – 2017-02-07 (×4): 50 mg via ORAL
  Filled 2017-01-31 (×4): qty 1

## 2017-01-31 MED ORDER — ZINC SULFATE 220 (50 ZN) MG PO CAPS
220.0000 mg | ORAL_CAPSULE | Freq: Every day | ORAL | Status: DC
  Administered 2017-02-01 – 2017-02-11 (×11): 220 mg via ORAL
  Filled 2017-01-31 (×12): qty 1

## 2017-01-31 MED ORDER — METRONIDAZOLE 500 MG PO TABS
500.0000 mg | ORAL_TABLET | Freq: Two times a day (BID) | ORAL | Status: AC
  Administered 2017-01-31 – 2017-02-03 (×7): 500 mg via ORAL
  Filled 2017-01-31 (×7): qty 1

## 2017-01-31 MED ORDER — VITAMIN B-12 1000 MCG PO TABS
1000.0000 ug | ORAL_TABLET | Freq: Every day | ORAL | Status: DC
  Administered 2017-02-01 – 2017-02-05 (×5): 1000 ug via ORAL
  Filled 2017-01-31 (×5): qty 1

## 2017-01-31 NOTE — Progress Notes (Signed)
Pharmacy Antibiotic Note  Sonya Park is a 76 y.o. female with recent treatment for E coli bacteremia with Cipro who continues to have elevated WBC and PCT.  Pharmacy has been consulted for vancomycin and meropenem dosing.  Plan: Blood cultures from 10/21 NG final and WBC now normal. Discontinue vancomycin per Dr. Posey Pronto. Continue meropenem for now pending further ID recs.   Height: 5\' 6"  (167.6 cm) Weight: 156 lb (70.8 kg) IBW/kg (Calculated) : 59.3  Temp (24hrs), Avg:98.4 F (36.9 C), Min:98.1 F (36.7 C), Max:98.8 F (37.1 C)   Recent Labs Lab 01/27/17 0538 01/28/17 0441 01/29/17 1339 01/30/17 0602 01/30/17 1700 01/31/17 1225  WBC 21.9* 17.4* 12.4* 11.2*  --  7.2  CREATININE 0.32* 0.57 0.37* 0.34*  --  <0.30*  VANCOTROUGH  --   --   --   --  11*  --     CrCl cannot be calculated (This lab value cannot be used to calculate CrCl because it is not a number: <0.30).    Allergies  Allergen Reactions  . Other Itching    "mycin" doesn't know which one  . Penicillins Swelling    Has patient had a PCN reaction causing immediate rash, facial/tongue/throat swelling, SOB or lightheadedness with hypotension: Yes Has patient had a PCN reaction causing severe rash involving mucus membranes or skin necrosis: No Has patient had a PCN reaction that required hospitalization: No Has patient had a PCN reaction occurring within the last 10 years: No If all of the above answers are "NO", then may proceed with Cephalosporin use.    Antimicrobials this admission: S/P ciprofloxacin 10/12 >> 10/18 Meropenem 10/22 >>  Vancomycin 10/22 >>   Dose adjustments this admission: 10/25 VancT= 11 mcg/ml:  Vanc 1 gram q18h  to 1250mg  q18h  Microbiology results: 10/9 BCx: E coli 10/21 BCx: NGTD 10/15 MRSA PCR: negative  Thank you for allowing pharmacy to be a part of this patient's care.  Laural Benes 01/31/2017 3:10 PM

## 2017-01-31 NOTE — Progress Notes (Signed)
Primary nurse spoke to [place] (from speech) in regard to pt receiving order for Soft Diet by Dr. Allen Norris. Belenda Cruise informed primary RN that she would speak to Dr.of discussion between primary nurse and Belenda Cruise.

## 2017-01-31 NOTE — Progress Notes (Addendum)
Speech Therapy Note: reviewed chart notes; consulted w/ NSG and MD(GI) re: pt's status and her oral diet, NG in place. Pt has not demonstrated s/s of oropharyngeal phase dysphagia while being on the full liquid diet since oral extubation per MD//NSG chart notes. Informed GI that pt has been on an oral diet but of full liquid d/t the presence of the NG tube and any potential impact of it on the UES/Esophagus and w/ consideration of pt having had Esophageal dysmotility prior to this hospitalization per Husband's report. GI will f/u w/ pt and Husband re: plan of care goals re: upgrading the diet, NG tube removal. ST services will f/u w/ pt's toleration of any upgrade in oral diet; education on general aspiration precautions next 1-3 days. NSG updated and agreed. MD agreed. Dietician following d/t pt's decreased oral intake; nutritional needs.    Orinda Kenner, [place], CCC-SLP

## 2017-01-31 NOTE — Progress Notes (Signed)
Physical Therapy Treatment Patient Details Name: Sonya Park MRN: [id number] DOB: 19-May-1940 Today's Date: 01/31/2017    History of Present Illness 76 y/o female who suffered a fall with considerable facial fractures and ended up being intubated 10/10-10/17 in CCU.  h/o ETOH abuse, e. coli (+)    PT Comments    Pt presented in bed with husband present agreeable to therapy. Pt participated in supine therex as noted below. Pt required some re-direction to complete exercises due to increased distractions in room (MD/CM/nsg present). Pt able to follow one step commands throughout session. Per MD and CM pt now being referred to [facility] She would continue to benefit from skilled PT to increase strength and endurance to improve overall functional mobility.    Follow Up Recommendations  LTACH     Equipment Recommendations  Rolling walker with 5" wheels    Recommendations for Other Services       Precautions / Restrictions Precautions Precautions: Fall Restrictions Weight Bearing Restrictions: No    Mobility  Bed Mobility               General bed mobility comments: N/T  Transfers                    Ambulation/Gait                 Stairs            Wheelchair Mobility    Modified Rankin (Stroke Patients Only)       Balance                                            Cognition Arousal/Alertness: Awake/alert Behavior During Therapy: Flat affect Overall Cognitive Status: Within Functional Limits for tasks assessed                                 General Comments: Pt able to follow one step commands and answer direct questions      Exercises General Exercises - Upper Extremity Shoulder Flexion: AROM;Both;10 reps;Supine General Exercises - Lower Extremity Ankle Circles/Pumps: AROM;Strengthening;Both;15 reps;Supine Quad Sets: Strengthening;Both;10 reps;Supine Gluteal Sets: Strengthening;Both;10  reps;Supine Heel Slides: AAROM;Strengthening;Both;10 reps;Supine Hip ABduction/ADduction: AROM;Strengthening;Both;10 reps;Supine Straight Leg Raises: AAROM;Strengthening;Both;10 reps;Supine Other Exercises Other Exercises: pillow squeezes x 10 bilaterally    General Comments        Pertinent Vitals/Pain Pain Assessment: No/denies pain    Home Living                      Prior Function            PT Goals (current goals can now be found in the care plan section) Progress towards PT goals: Progressing toward goals    Frequency    Min 2X/week      PT Plan Current plan remains appropriate    Co-evaluation              AM-PAC PT "6 Clicks" Daily Activity  Outcome Measure  Difficulty turning over in bed (including adjusting bedclothes, sheets and blankets)?: Unable Difficulty moving from lying on back to sitting on the side of the bed? : Unable Difficulty sitting down on and standing up from a chair with arms (e.g., wheelchair, bedside commode, etc,.)?: Unable Help needed moving to and from  a bed to chair (including a wheelchair)?: Total Help needed walking in hospital room?: Total Help needed climbing 3-5 steps with a railing? : Total 6 Click Score: 6    End of Session   Activity Tolerance: Patient tolerated treatment well Patient left: in bed;with call bell/phone within reach;with nursing/sitter in room;with family/visitor present   PT Visit Diagnosis: Muscle weakness (generalized) (M62.81)     Time: 1100-1130 PT Time Calculation (min) (ACUTE ONLY): 30 min  Charges:  $Therapeutic Exercise: 23-37 mins             Sharin Altidor  Samel Bruna, PTA 01/31/2017, 12:14 PM

## 2017-01-31 NOTE — Consult Note (Signed)
Sonya Lame, MD [facility]  [address] [place], [place] [address] Phone: [phone] Fax : [phone]  Consultation  Referring Provider:     Dr. Rosana Hoes Primary Care Physician:  Sonya Athens, MD Primary Gastroenterologist:  Dr. Allen Park         Reason for Consultation:     Fatty liver with ascites  Date of Admission:  01/14/2017 Date of Consultation:  01/31/2017         HPI:   Sonya Park is a 76 y.o. female who has a history of long-standing alcohol use and was found after fall in her RV. The patient had broken had lost blood from her wound.The patient was thought to have been unconscious for some time since the husband came with seeing a large amount of blood on the floor. The patient was found to be hypotension and admitted to the ICU with the need for pressors.  The patient was also found to have E. coli in the urine and was treated with antibiotics.  During the patient's today they noted the patient's bilirubin to increase to a peak of 12.2 on the 16th of the month which is come down to 2.2 today.  The patient's direct bilirubin today was 0.9 with the indirect of 1.3.  The patient has had a low albumin and is being transfused albumin at this time.  The patient was also found to have fatty liver on imaging and ascites.  It appears that the patient may have been going through DTs when she was admitted to the hospital.  The patient denies any abdominal pain nausea vomiting black stools or bloody stools at this time.  I am now being asked to see the patient for possible alcoholic liver disease.  Of note the patient also was found to have thrombocytopenia on admission.  The patient's platelets have been as low as 14 but have come back to 183.  Past Medical History:  Diagnosis Date  . Arthritis   . Back pain   . Insomnia   . Medical history non-contributory   . Osteoporosis     Past Surgical History:  Procedure Laterality Date  . ABDOMINAL HYSTERECTOMY    . BREAST LUMPECTOMY  Right   . COLONOSCOPY WITH PROPOFOL N/A 07/18/2015   Procedure: COLONOSCOPY WITH PROPOFOL;  Surgeon: Sonya Lame, MD;  Location: [facility] ENDOSCOPY;  Service: Endoscopy;  Laterality: N/A;  . COSMETIC SURGERY    . GASTRIC BYPASS     X2  . HEMORROIDECTOMY    . KNEE ARTHROPLASTY Right 12/13/2015   Procedure: COMPUTER ASSISTED TOTAL KNEE ARTHROPLASTY;  Surgeon: Dereck Leep, MD;  Location: [facility] ORS;  Service: Orthopedics;  Laterality: Right;  . KNEE ARTHROSCOPY    . REPLACEMENT TOTAL KNEE Left     Prior to Admission medications   Medication Sig Start Date End Date Taking? Authorizing Provider  aspirin 81 MG tablet Take 81 mg by mouth daily.   Yes [provider]  CALCIUM CARBONATE PO Take 1 tablet by mouth daily.    Yes [provider]  cholecalciferol (VITAMIN D) 1000 UNITS tablet Take 2,000 Units by mouth daily.    Yes [provider]  estrogens, conjugated, (PREMARIN) 0.625 MG tablet Take 0.625 mg by mouth daily. Take daily for 21 days then do not take for 7 days.   Yes [provider]  ibandronate (BONIVA) 150 MG tablet Take 150 mg by mouth every 30 (thirty) days. Take in the morning with a full glass  of water, on an empty stomach, and do not take anything else by mouth or lie down for the next 30 min.   Yes [provider]  Probiotic Product (DIGESTIVE ADVANTAGE GUMMIES) CHEW Chew 3 Doses by mouth daily.    Yes [provider]  clindamycin (CLEOCIN) 150 MG capsule Take 600 mg by mouth daily as needed. Dental work    Secondary school teacher, Historical, MD  DiphenhydrAMINE HCl (ZZZQUIL) 50 MG/30ML LIQD Take 30 mLs by mouth at bedtime as needed (for sleep).    [provider]  oxyCODONE (OXY IR/ROXICODONE) 5 MG immediate release tablet Take 1-2 tablets (5-10 mg total) by mouth every 4 (four) hours as needed for moderate pain. Patient not taking: Reported on 01/14/2017 12/15/15   Lattie Corns, PA-C  traMADol (ULTRAM) 50 MG tablet Take 1-2 tablets  (50-100 mg total) by mouth every 4 (four) hours as needed for moderate pain. Patient not taking: Reported on 01/14/2017 12/15/15   Lattie Corns, PA-C    Family History  Problem Relation Age of Onset  . Liver disease Mother   . Heart attack Father   . Cancer Father   . CAD Sister      Social History  Substance Use Topics  . Smoking status: Former Smoker    Quit date: 11/30/1994  . Smokeless tobacco: Never Used  . Alcohol use 12.6 - 21.0 oz/week    21 - 35 Glasses of wine per week     Comment: 3-5 glasses of wine/ day    Allergies as of 01/14/2017 - Review Complete 01/14/2017  Allergen Reaction Noted  . Other Itching 11/30/2015  . Penicillins Swelling 09/17/2014    Review of Systems:    All systems reviewed and negative except where noted in HPI.   Physical Exam:  Vital signs in last 24 hours: Temp:  [98 F (36.7 C)-98.8 F (37.1 C)] 98.8 F (37.1 C) (10/26 0440) Pulse Rate:  [96-103] 103 (10/26 0440) Resp:  [18-20] 18 (10/26 0440) BP: (127-132)/(61-82) 127/75 (10/26 0440) SpO2:  [94 %-99 %] 95 % (10/26 0726) Weight:  [156 lb (70.8 kg)] 156 lb (70.8 kg) (10/26 0500) Last BM Date: 01/30/17 General:   Pleasant, cooperative in NAD Head: Patient with bruising under her eyes and around her face with what appears to be a broken nose. Eyes:   No icterus.   Conjunctiva pink. PERRLA. Ears:  Normal auditory acuity. Neck:  Supple; no masses or thyroidomegaly Lungs: Respirations even and unlabored. Lungs clear to auscultation bilaterally.   No wheezes, crackles, or rhonchi.  Heart:  Regular rate and rhythm;  Without murmur, clicks, rubs or gallops Abdomen:  Soft, positive distention with ascites, nontender. Normal bowel sounds. No appreciable masses or hepatomegaly.  No rebound or guarding.  Rectal:  Not performed. Msk:  Symmetrical without gross deformities.  S  Extremities:  Without edema, cyanosis or clubbing. Neurologic:  Alert and oriented x3;  grossly normal  neurologically. Skin:  Intact without significant lesions or rashes. Cervical Nodes:  No significant cervical adenopathy. Psych:  Alert and cooperative. Normal affect.  LAB RESULTS:  Recent Labs  01/29/17 1339 01/30/17 0602  WBC 12.4* 11.2*  HGB 8.6* 8.9*  HCT 28.1* 28.5*  PLT 187 183   BMET  Recent Labs  01/29/17 1339 01/30/17 0602  NA 145 143  K 3.2* 3.4*  CL 113* 109  CO2 27 30  GLUCOSE 77 109*  BUN 26* 29*  CREATININE 0.37* 0.34*  CALCIUM 6.8* 7.4*   LFT  Recent Labs  01/29/17 1339  PROT 3.9*  ALBUMIN 1.5*  AST 126*  ALT 92*  ALKPHOS 167*  BILITOT 2.2*  BILIDIR 0.9*  IBILI 1.3*   PT/INR  Recent Labs  01/30/17 0856  LABPROT 13.1  INR 1.00    STUDIES: US Abdomen Complete  Result Date: 01/29/2017 CLINICAL DATA:  Elevated LFTs. EXAM: ABDOMEN ULTRASOUND COMPLETE COMPARISON:  Right upper quadrant ultrasound 01/21/2017. CT of the abdomen and pelvis 01/14/2017. FINDINGS: Gallbladder: The gallbladder wall is markedly thickened, measuring up to 8.8 mm. A 1.1 cm shadowing stone is again noted at the neck of the gallbladder. There is no sonographic Murphy sign. Layering sludge is present. Common bile duct: Diameter: 4.1 mm, within normal limits. Liver: There is diffuse fatty infiltration of liver. No discrete lesions are present. Portal vein is patent on color Doppler imaging with normal direction of blood flow towards the liver. IVC: No abnormality visualized. Pancreas: Visualized portion unremarkable. Spleen: Size and appearance within normal limits. Maximal length is 4.8 cm. Right Kidney: Length: 10.4 cm, within normal limits. Echogenicity within normal limits. No mass or hydronephrosis visualized. Left Kidney: Length: 11.7 cm, within normal limits. Echogenicity within normal limits. No mass or hydronephrosis visualized. Abdominal aorta: Maximal diameter is 2.7 cm. Atherosclerotic changes are noted. Other findings: Increasing diffuse abdominal ascites is present.  IMPRESSION: 1. Progressive wall thickening of the gallbladder with a 1.1 cm shadowing stone at the neck of the gallbladder. Findings are concerning for developing acute cholecystitis. 2. Progressive abdominal ascites. 3. Hepatic steatosis. Electronically Signed   By: San Morelle M.D.   On: 01/29/2017 14:41      Impression / Plan:   Sonya Park is a 76 y.o. 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  The patient's platelets have come back to normal and her bilirubin is coming down.  The patient's INR is 1 showing that her synthetic function is still good.  The patient's imaging shows fatty liver and does not show any signs of portal hypertension or shrunken up nodular liver.  The patient seems to be rapidly improving from a liver point of view and it appears that her synthetic function and filtering function appears to be returning.  The patient should have her diet advanced so that she can increase her nutrition and should be involved in physical therapy to strengthen her up.  The patient has been encouraged to try and take in as much p.o. that she can.  The husband states that since her gastric bypass surgery it has been hard for her to eat large amounts of food.  I will discontinue the tube feedings and the NG tube so that she will have the urge to eat.  If he does not keep up with her calories the NG tube can be replaced and she can be fed again with tube feedings.  I believe the patient's prognosis is improving every day and there is nothing further to do from a GI point of view except to avoid any further alcohol intake.  The patient and her husband have been explained the plan and agree with it.  Thank you for involving me in the care of this patient.      LOS: 17 days   Sonya Lame, MD  01/31/2017, 11:22 AM   Note: This dictation was prepared with Dragon dictation along  with smaller phrase technology. Any transcriptional errors that result from  this process are unintentional.

## 2017-01-31 NOTE — Progress Notes (Signed)
Nutrition Follow-up  DOCUMENTATION CODES:   Not applicable  INTERVENTION:  1. Monitor PO intake 2. Recommended Ensure Enlive po BID, each supplement provides 350 kcal and 20 grams of protein 3. If unable to meet 50% of needs PO over the next 1-2 days, recommend replace small bore NG tube and restart tube feeds. 4. Liquid MVI  -Recommend 16 mg elemental zinc PO for 6 months (200% RDI), 2 mg/day IV copper for 6 days (followed by 1 mg PO daily until zinc supplementation is complete), 10000-25000 IU/day vitamin A orally/per tube for 1-2 weeks, 1-2 mg/day vitamin K orally/per tube. -Micronutrient supplementation was started on 10/15. Recommend continuing supplementation of vitamin A and vitamin K until 10/22. Continue zinc supplementation for 6 months (and appropriate dose of copper to prevent recurrence of copper deficiency - recommended ratio is 1 mg copper for each 8-15 mg elemental zinc). -Patient will need close monitoring of nutrition-related labs every 3 months.  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 Intubated on 10/10. Extubated 01/22/2017 Elevated procalcitonin now on broad-spectrum antibiotics, ID to see patient.  Spoke with husband at bedside. [facility]. Patient may transfer there tomorrow. Discussed with RN, patient ate 50% of toast, eggs, and bacon for breakfast. Speaking more, still seems confused, more alert. Continues with generalized edema and third spacing, acute liver failue, ascites.  Labs reviewed  Medications reviewed and include: Vitamin D, Copper Gluconate, Folic Acid, 50I+, Thiamine B1, B12, Zinc  Diet Order:  DIET SOFT Room service appropriate? Yes; Fluid consistency: Thin  EDUCATION NEEDS:   Education needs addressed  Skin:  Skin Assessment: Wound (see comment) (Stg I buttocks, weeping to arms and legs, cellulitis lower legs, blisters arms and legs, skin tear left leg)  Last BM:  01/28/2017 (type 7)  Height:   Ht Readings from Last 1 Encounters:  01/14/17 5\' 6"  (1.676 m)    Weight:   Wt Readings from Last 1 Encounters:  01/31/17 156 lb (70.8 kg)    Ideal Body Weight:  59.1 kg  BMI:  Body mass index is 25.18 kg/m.  Estimated Nutritional Needs:   Kcal:  1475-1700 (MSJ x 1.3-1.5)  Protein:  85-100 grams (1.4-1.6 grams/kg)  Fluid:  1.5-1.8 L/day (25-30 ml/kg)  Satira Anis. Xian Alves, MS, RD LDN Inpatient Clinical Dietitian Pager [phone]

## 2017-01-31 NOTE — Progress Notes (Signed)
[facility] PROGRESS NOTE Date of Admission:  01/14/2017     ID: Sonya Park is a 76 y.o. female with  E coli bacteremia and persistently elevated WBC, LFTs Principal Problem:   Sepsis ([place]) Active Problems:   Facial trauma   Osteoporosis   Neutropenia (HCC)   Alcohol abuse   Acute respiratory failure (HCC)   Bacteremia   Palliative care by specialist   Goals of care, counseling/discussion   Pressure injury of skin   Hyperbilirubinemia   Alcoholic liver disease ([place])   Thrombocytopenia ([place])   Anasarca   LFT elevation   Severe malnutrition ([place])   Adult failure to thrive   Subjective: No fevers, wbc down. Still sleepy  ROS  Unable to obtain    Medications:  Antibiotics Given (last 72 hours)    Date/Time Action Medication Dose Rate   01/28/17 1610 New Bag/Given  [loss of IV access, PICC line was placed.]   meropenem (MERREM) 1 g in sodium chloride 0.9 % 100 mL IVPB 1 g 200 mL/hr   01/28/17 2238 Given   rifaximin (XIFAXAN) tablet 550 mg 550 mg    01/28/17 2358 New Bag/Given   meropenem (MERREM) 1 g in sodium chloride 0.9 % 100 mL IVPB 1 g 200 mL/hr   01/29/17 0504 New Bag/Given   vancomycin (VANCOCIN) IVPB 1000 mg/200 mL premix 1,000 mg 200 mL/hr   01/29/17 1024 Given   rifaximin (XIFAXAN) tablet 550 mg 550 mg    01/29/17 1024 New Bag/Given   meropenem (MERREM) 1 g in sodium chloride 0.9 % 100 mL IVPB 1 g 200 mL/hr   01/29/17 1700 New Bag/Given   meropenem (MERREM) 1 g in sodium chloride 0.9 % 100 mL IVPB 1 g 200 mL/hr   01/29/17 2226 Given   rifaximin (XIFAXAN) tablet 550 mg 550 mg    01/29/17 2346 New Bag/Given   vancomycin (VANCOCIN) IVPB 1000 mg/200 mL premix 1,000 mg 200 mL/hr   01/30/17 0112 New Bag/Given   meropenem (MERREM) 1 g in sodium chloride 0.9 % 100 mL IVPB 1 g 200 mL/hr   01/30/17 0843 New Bag/Given   meropenem (MERREM) 1 g in sodium chloride 0.9 % 100 mL IVPB 1 g 200 mL/hr   01/30/17 1127 Given   rifaximin (XIFAXAN)  tablet 550 mg 550 mg    01/30/17 1657 New Bag/Given   meropenem (MERREM) 1 g in sodium chloride 0.9 % 100 mL IVPB 1 g 200 mL/hr   01/30/17 1902 New Bag/Given   vancomycin (VANCOCIN) 1,250 mg in sodium chloride 0.9 % 250 mL IVPB 1,250 mg 166.7 mL/hr   01/30/17 2226 Given   rifaximin (XIFAXAN) tablet 550 mg 550 mg    01/31/17 0203 New Bag/Given   meropenem (MERREM) 1 g in sodium chloride 0.9 % 100 mL IVPB 1 g 200 mL/hr   01/31/17 1041 New Bag/Given   meropenem (MERREM) 1 g in sodium chloride 0.9 % 100 mL IVPB 1 g 200 mL/hr   01/31/17 1047 Given   rifaximin (XIFAXAN) tablet 550 mg 550 mg    01/31/17 1346 New Bag/Given   vancomycin (VANCOCIN) 1,250 mg in sodium chloride 0.9 % 250 mL IVPB 1,250 mg 166.7 mL/hr     . chlorhexidine gluconate (MEDLINE KIT)  15 mL Mouth Rinse BID  . cholecalciferol  2,000 Units Oral Daily  . Copper Gluconate  1 tablet Oral Daily  . enoxaparin (LOVENOX) injection  40 mg Subcutaneous Q24H  . feeding supplement (ENSURE ENLIVE)  237  mL Oral BID BM  . [START ON 29/93/7169] folic acid  1 mg Oral Daily  . furosemide  20 mg Intravenous Q12H  . ipratropium-albuterol  3 mL Nebulization TID  . modafinil  100 mg Oral Daily  . [START ON 02/01/2017] multivitamin  15 mL Oral Daily  . [START ON 02/01/2017] phytonadione  10 mg Oral Daily  . potassium chloride  40 mEq Oral Daily  . rifaximin  550 mg Oral BID  . sodium chloride flush  10-40 mL Intracatheter Q12H  . [START ON 02/01/2017] thiamine  100 mg Oral Daily  . [START ON 02/01/2017] vitamin B-12  1,000 mcg Oral Daily  . [START ON 02/01/2017] zinc sulfate  220 mg Oral Daily    Objective: Vital signs in last 24 hours: Temp:  [98.1 F (36.7 C)-98.8 F (37.1 C)] 98.3 F (36.8 C) (10/26 1152) Pulse Rate:  [95-103] 95 (10/26 1152) Resp:  [18-20] 18 (10/26 0440) BP: (115-132)/(70-82) 115/70 (10/26 1152) SpO2:  [93 %-96 %] 93 % (10/26 1328) Weight:  [70.8 kg (156 lb)] 70.8 kg (156 lb) (10/26 0500) Constitutional:   Thin, frail, ecchymosis over face  HENT: [place]/AT, PERRLA, + jaundiced Mouth/Throat: lips cracked and  dry Cardiovascular: Normal rate, regular rhythm and normal heart sounds.  Pulmonary/Chest: poor air movement Neck = supple, no nuchal rigidity Abdominal: Soft. Bowel sounds are normal.  Obese, ? Grimaces in RUQ palp Lymphadenopathy: no cervical adenopathy. No axillary adenopathy Neurological: lethargic Ext anasarca Skin: L leg with multiple wounds, some necrotic appearing Psychiatric:lethargic  Lab Results  Recent Labs  01/30/17 0602 01/31/17 1225  WBC 11.2* 7.2  HGB 8.9* 7.4*  HCT 28.5* 22.8*  NA 143 140  K 3.4* 3.8  CL 109 105  CO2 30 27  BUN 29* 23*  CREATININE 0.34* <0.30*    Microbiology: Results for orders placed or performed during the hospital encounter of 01/14/17  Blood Culture (routine x 2)     Status: Abnormal   Collection Time: 01/14/17 11:08 PM  Result Value Ref Range Status   Specimen Description BLOOD LFOA  Final   Special Requests   Final    BOTTLES DRAWN AEROBIC AND ANAEROBIC Blood Culture results may not be optimal due to an excessive volume of blood received in culture bottles   Culture  Setup Time   Final    GRAM NEGATIVE RODS IN BOTH AEROBIC AND ANAEROBIC BOTTLES CRITICAL RESULT CALLED TO, READ BACK BY AND VERIFIED WITH:  MICHAEL SIMPSON AT 1207 01/15/17 SDR    Culture ESCHERICHIA COLI (A)  Final   Report Status 01/17/2017 FINAL  Final   Organism ID, Bacteria ESCHERICHIA COLI  Final      Susceptibility   Escherichia coli - MIC*    AMPICILLIN >=32 RESISTANT Resistant     CEFAZOLIN <=4 SENSITIVE Sensitive     CEFEPIME <=1 SENSITIVE Sensitive     CEFTAZIDIME <=1 SENSITIVE Sensitive     CEFTRIAXONE <=1 SENSITIVE Sensitive     CIPROFLOXACIN 1 SENSITIVE Sensitive     GENTAMICIN <=1 SENSITIVE Sensitive     IMIPENEM <=0.25 SENSITIVE Sensitive     TRIMETH/SULFA >=320 RESISTANT Resistant     AMPICILLIN/SULBACTAM >=32 RESISTANT Resistant      PIP/TAZO <=4 SENSITIVE Sensitive     Extended ESBL NEGATIVE Sensitive     * ESCHERICHIA COLI  Blood Culture (routine x 2)     Status: Abnormal   Collection Time: 01/14/17 11:08 PM  Result Value Ref Range Status   Specimen Description BLOOD  LAC  Final   Special Requests   Final    BOTTLES DRAWN AEROBIC AND ANAEROBIC Blood Culture results may not be optimal due to an excessive volume of blood received in culture bottles   Culture  Setup Time   Final    GRAM NEGATIVE RODS IN BOTH AEROBIC AND ANAEROBIC BOTTLES CRITICAL VALUE NOTED.  VALUE IS CONSISTENT WITH PREVIOUSLY REPORTED AND CALLED VALUE.    Culture (A)  Final    ESCHERICHIA COLI SUSCEPTIBILITIES PERFORMED ON PREVIOUS CULTURE WITHIN THE LAST 5 DAYS. Performed at [facility], [place] [address], [place], [place] [address]    Report Status 01/17/2017 FINAL  Final  Urine culture     Status: Abnormal   Collection Time: 01/14/17 11:08 PM  Result Value Ref Range Status   Specimen Description URINE, RANDOM  Final   Special Requests NONE  Final   Culture (A)  Final    <10,000 COLONIES/mL INSIGNIFICANT GROWTH Performed at [facility], [place] [address], [place], [place] [address]    Report Status 01/16/2017 FINAL  Final  Blood Culture ID Panel (Reflexed)     Status: Abnormal   Collection Time: 01/14/17 11:08 PM  Result Value Ref Range Status   Enterococcus species NOT DETECTED NOT DETECTED Final   Listeria monocytogenes NOT DETECTED NOT DETECTED Final   Staphylococcus species NOT DETECTED NOT DETECTED Final   Staphylococcus aureus NOT DETECTED NOT DETECTED Final   Streptococcus species NOT DETECTED NOT DETECTED Final   Streptococcus agalactiae NOT DETECTED NOT DETECTED Final   Streptococcus pneumoniae NOT DETECTED NOT DETECTED Final   Streptococcus pyogenes NOT DETECTED NOT DETECTED Final   Acinetobacter baumannii NOT DETECTED NOT DETECTED Final   Enterobacteriaceae species DETECTED (A) NOT DETECTED Final    Comment:  Enterobacteriaceae represent a large family of gram-negative bacteria, not a single organism. CRITICAL RESULT CALLED TO, READ BACK BY AND VERIFIED WITH:  MICHAEL SIMPSON AT 1207 01/15/17 SDR    Enterobacter cloacae complex NOT DETECTED NOT DETECTED Final   Escherichia coli DETECTED (A) NOT DETECTED Final    Comment: CRITICAL RESULT CALLED TO, READ BACK BY AND VERIFIED WITH:  MICHAEL SIMPSON AT 1207 01/15/17 SDR    Klebsiella oxytoca NOT DETECTED NOT DETECTED Final   Klebsiella pneumoniae NOT DETECTED NOT DETECTED Final   Proteus species NOT DETECTED NOT DETECTED Final   Serratia marcescens NOT DETECTED NOT DETECTED Final   Carbapenem resistance NOT DETECTED NOT DETECTED Final   Haemophilus influenzae NOT DETECTED NOT DETECTED Final   Neisseria meningitidis NOT DETECTED NOT DETECTED Final   Pseudomonas aeruginosa NOT DETECTED NOT DETECTED Final   Candida albicans NOT DETECTED NOT DETECTED Final   Candida glabrata NOT DETECTED NOT DETECTED Final   Candida krusei NOT DETECTED NOT DETECTED Final   Candida parapsilosis NOT DETECTED NOT DETECTED Final   Candida tropicalis NOT DETECTED NOT DETECTED Final  MRSA PCR Screening     Status: None   Collection Time: 01/20/17 11:04 AM  Result Value Ref Range Status   MRSA by PCR NEGATIVE NEGATIVE Final    Comment:        The GeneXpert MRSA Assay (FDA approved for NASAL specimens only), is one component of a comprehensive MRSA colonization surveillance program. It is not intended to diagnose MRSA infection nor to guide or monitor treatment for MRSA infections.   CULTURE, BLOOD (ROUTINE X 2) w Reflex to ID Panel     Status: None   Collection Time: 01/26/17  4:08 PM  Result Value Ref Range Status   Specimen Description BLOOD BLOOD LEFT WRIST  Final   Special Requests   Final    BOTTLES DRAWN AEROBIC AND ANAEROBIC Blood Culture adequate volume   Culture NO GROWTH 5 DAYS  Final   Report Status 01/31/2017 FINAL  Final  CULTURE, BLOOD (ROUTINE  X 2) w Reflex to ID Panel     Status: None   Collection Time: 01/26/17  4:08 PM  Result Value Ref Range Status   Specimen Description BLOOD BLOOD RIGHT WRIST  Final   Special Requests   Final    BOTTLES DRAWN AEROBIC AND ANAEROBIC Blood Culture results may not be optimal due to an inadequate volume of blood received in culture bottles   Culture NO GROWTH 5 DAYS  Final   Report Status 01/31/2017 FINAL  Final  Aerobic Culture (superficial specimen)     Status: None (Preliminary result)   Collection Time: 01/28/17  3:28 PM  Result Value Ref Range Status   Specimen Description WOUND  Final   Special Requests Normal  Final   Gram Stain   Final    MODERATE WBC PRESENT, PREDOMINANTLY PMN NO ORGANISMS SEEN    Culture   Final    NO GROWTH 3 DAYS Performed at [facility], [place] [address] [place],  [address]    Report Status PENDING  Incomplete    Studies/Results: No results found.  Assessment/Plan: Sonya Park is a 76 y.o. female admitted after a fall and found to have E coli bacteremia. She initially was septic but is out of unit now. WBC peaked at 32. Was initially on only cipro for the E coli bacteremia. Down to 12 since change from cipro to vanco and meropenem. Poor historian but possible sources of her persistent elevated wbc include gallbladder, or leg wounds.  She is PCN allergic.  10/24- US shows increasing GB wall thickness and a stone in neck.  I think she likely has cholecystitis.  Culture from leg wound pending. Seen by wound care.  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  No fevers and wbc down  to 7.  Wound cx still negative Day 18 of antibiotics  Recommendations Dc vanco and fluconazole  Can change meropenem to cipro and flagyl for another 3 days then hopefully can dc abx and follow.  Continue wound care per wound care consult recs. Thank you very much for the consult. Will follow  with you.  Leonel Ramsay   01/31/2017, 3:58 PM

## 2017-01-31 NOTE — Progress Notes (Signed)
Patient ID: Sonya Park, female   DOB: [birth year], 76 y.o.   MRN: [id number]   [facility] PROGRESS NOTE  [name] [id number] DOB: [birth year] DOA: 01/14/2017 PCP: Cletis Athens, MD  HPI/Subjective: Patient more awake today however still confused, barely able to speak Patient still has diarrhea and has a rectal tube in place Patient developed urinary retention yesterday and has Foley in place Patient had NG tube taken out, she did eat breakfast this morning which was limited Patient unable to do much with physical therapy    Objective: Vitals:   01/31/17 1152 01/31/17 1328  BP: 115/70   Pulse: 95   Resp:    Temp: 98.3 F (36.8 C)   SpO2: 96% 93%    Filed Weights   01/29/17 0433 01/30/17 0447 01/31/17 0500  Weight: 147 lb (66.7 kg) 142 lb (64.4 kg) 156 lb (70.8 kg)    ROS: Review of Systems  Unable to perform ROS: Mental status change  Constitutional: Negative for fever.   Exam: Physical Exam  HENT:  Nose: No mucosal edema.  Mouth/Throat: No oropharyngeal exudate.  Eyes: Pupils are equal, round, and reactive to light. Lids are normal.  Icteric.   Neck: Carotid bruit is not present. No thyromegaly present.  Cardiovascular: Regular rhythm, S1 normal and S2 normal.   Respiratory: She has decreased breath sounds in the right lower field and the left lower field. She has no wheezes. She has no rhonchi. She has no rales.  GI: Soft. Bowel sounds are normal. There is no tenderness.  Musculoskeletal:       Right elbow: She exhibits no swelling.       Left elbow: She exhibits no swelling.       Right wrist: She exhibits no swelling.       Left wrist: She exhibits no swelling.       Right knee: She exhibits no swelling.       Left knee: She exhibits no swelling.       Right ankle: She exhibits no swelling.  Lymphadenopathy:    She has no cervical adenopathy.  Neurological: She is alert.  Patient barely able to straight leg raise. Patient able to lift arms  up off the bed  Skin:  Dressing in place  Psychiatric: She has a normal mood and affect.      Data Reviewed: Basic Metabolic Panel:  Recent Labs Lab 01/27/17 0538 01/28/17 0441 01/29/17 1339 01/30/17 0602 01/31/17 1225  NA 149* 146* 145 143 140  K 4.4 3.1* 3.2* 3.4* 3.8  CL 113* 113* 113* 109 105  CO2 '25 28 27 30 27  '$ GLUCOSE 109* 125* 77 109* 93  BUN 43* 36* 26* 29* 23*  CREATININE 0.32* 0.57 0.37* 0.34* <0.30*  CALCIUM 7.6* 7.7* 6.8* 7.4* 7.5*   Liver Function Tests:  Recent Labs Lab 01/25/17 0515 01/27/17 0538 01/29/17 1339  AST 213* 233* 126*  ALT 96* 116* 92*  ALKPHOS 255* 235* 167*  BILITOT 7.8* 4.4* 2.2*  PROT 4.9* 4.4* 3.9*  ALBUMIN 2.0* 1.8* 1.5*    Recent Labs Lab 01/26/17 0439  AMMONIA 53*   CBC:  Recent Labs Lab 01/27/17 0538 01/28/17 0441 01/29/17 1339 01/30/17 0602 01/31/17 1225  WBC 21.9* 17.4* 12.4* 11.2* 7.2  NEUTROABS  --   --   --   --  5.9  HGB 10.2* 10.0* 8.6* 8.9* 7.4*  HCT 32.2* 31.9* 28.1* 28.5* 22.8*  MCV 114.5* 117.6* 116.3* 116.5* 114.7*  PLT 156 202  187 183 131*    CBG:  Recent Labs Lab 01/24/17 2010  GLUCAP 107*    Recent Results (from the past 240 hour(s))  CULTURE, BLOOD (ROUTINE X 2) w Reflex to ID Panel     Status: None   Collection Time: 01/26/17  4:08 PM  Result Value Ref Range Status   Specimen Description BLOOD BLOOD LEFT WRIST  Final   Special Requests   Final    BOTTLES DRAWN AEROBIC AND ANAEROBIC Blood Culture adequate volume   Culture NO GROWTH 5 DAYS  Final   Report Status 01/31/2017 FINAL  Final  CULTURE, BLOOD (ROUTINE X 2) w Reflex to ID Panel     Status: None   Collection Time: 01/26/17  4:08 PM  Result Value Ref Range Status   Specimen Description BLOOD BLOOD RIGHT WRIST  Final   Special Requests   Final    BOTTLES DRAWN AEROBIC AND ANAEROBIC Blood Culture results may not be optimal due to an inadequate volume of blood received in culture bottles   Culture NO GROWTH 5 DAYS  Final    Report Status 01/31/2017 FINAL  Final  Aerobic Culture (superficial specimen)     Status: None (Preliminary result)   Collection Time: 01/28/17  3:28 PM  Result Value Ref Range Status   Specimen Description WOUND  Final   Special Requests Normal  Final   Gram Stain   Final    MODERATE WBC PRESENT, PREDOMINANTLY PMN NO ORGANISMS SEEN    Culture   Final    NO GROWTH 3 DAYS Performed at [facility], [place] [address], [place], [place] [address]    Report Status PENDING  Incomplete     Studies: No results found.  Scheduled Meds: . chlorhexidine gluconate (MEDLINE KIT)  15 mL Mouth Rinse BID  . cholecalciferol  2,000 Units Oral Daily  . Copper Gluconate  1 tablet Oral Daily  . enoxaparin (LOVENOX) injection  40 mg Subcutaneous Q24H  . [START ON 19/50/9326] folic acid  1 mg Oral Daily  . furosemide  20 mg Intravenous Q12H  . ipratropium-albuterol  3 mL Nebulization TID  . modafinil  100 mg Oral Daily  . [START ON 02/01/2017] multivitamin  15 mL Oral Daily  . [START ON 02/01/2017] phytonadione  10 mg Oral Daily  . potassium chloride  40 mEq Oral Daily  . rifaximin  550 mg Oral BID  . sodium chloride flush  10-40 mL Intracatheter Q12H  . [START ON 02/01/2017] thiamine  100 mg Oral Daily  . [START ON 02/01/2017] vitamin B-12  1,000 mcg Oral Daily  . [START ON 02/01/2017] zinc sulfate  220 mg Oral Daily   Continuous Infusions: . fluconazole (DIFLUCAN) IV 200 mg (01/31/17 1346)  . meropenem (MERREM) IV Stopped (01/31/17 1130)    Assessment/Plan: Patient is a 76 year old white female with alcoholic liver disease    1. Septic shock with Escherichia coli.  status post treatment with Cipro Now patient on meropenem due to persistently elevated WBC count, I will discontinue vancomycin since her blood cultures have been negative, appreciate ID input  2. Alcoholic liver disease with acute liver failure: patient's liver function are stable, however clinically patient has evidence  of ascites on her ultrasound, severe hypoalbuminemia with third spacing- I have given her albumin multiple doses since yesterday, may need paracentesis in the near future  3. Acute respiratory failure with hypoxia. Patient extubated 01/22/2017. Currently on room air 4. Dysphasia with's sore throat and upper GI series  shows no evidence of obstruction : patient's NG tube has been removed since it's being there for a long period of time, we will have to monitor her by mouth intake if she does not meet her caloric intake will need to place back to NG tube 5. Acute encephalopathy secondary to severe sepsis.  continue Xifaxan mental with some improvement continue Xifaxan and Provigil 6. Pancytopenia could be secondary to alcohol abuse and/or sepsis. Platelet count recovered 7. Hypomagnesemia, hypocalcemia and hypokalemia. replaced 8. Severe malnutrition, Anasarca and third spacing of fluids.  Patient has been receiving IV albumin since yesterday 9. Jaundice likely secondary to shock. Could also be secondary to prior alcohol abuse.jaundice improving   10. Facial trauma at home. Fractured nasal bones. Hematoma and bruising around the eyelids. 11. Anasarca continue low dose lasix 12. Prognosis very poor: patient still clinically ill, she needs aggressive management and would benefit from LTAC facility   Code Status:     Code Status Orders        Start     Ordered   01/15/17 0158  Full code  Continuous     01/15/17 0157    Code Status History    Date Active Date Inactive Code Status Order ID Comments User Context   12/13/2015 11:49 AM 12/15/2015  7:28 PM Full Code [id number]  Dereck Leep, MD Inpatient   09/17/2014  6:01 AM 09/18/2014  3:51 PM Full Code [id number]  Juluis Mire, MD Inpatient     Family Communication: spoke with husband at the bedside Disposition Plan: potential candidate for LTAC  Consultants:  Critical care specialist  Antibiotics:  Cipro  Time spent: 65  minutes  [place], [facility]

## 2017-01-31 NOTE — Care Management (Signed)
RNCM followed up with husband [name].  He has chosen [facility].  Erika with [facility] notified of referral.

## 2017-02-01 LAB — COMPREHENSIVE METABOLIC PANEL
ALK PHOS: 131 U/L — AB (ref 38–126)
ALT: 62 U/L — AB (ref 14–54)
ANION GAP: 4 — AB (ref 5–15)
AST: 85 U/L — ABNORMAL HIGH (ref 15–41)
Albumin: 2.1 g/dL — ABNORMAL LOW (ref 3.5–5.0)
BILIRUBIN TOTAL: 2.1 mg/dL — AB (ref 0.3–1.2)
BUN: 21 mg/dL — ABNORMAL HIGH (ref 6–20)
CALCIUM: 7.4 mg/dL — AB (ref 8.9–10.3)
CO2: 28 mmol/L (ref 22–32)
Chloride: 109 mmol/L (ref 101–111)
Glucose, Bld: 80 mg/dL (ref 65–99)
Potassium: 3.4 mmol/L — ABNORMAL LOW (ref 3.5–5.1)
SODIUM: 141 mmol/L (ref 135–145)
TOTAL PROTEIN: 4.2 g/dL — AB (ref 6.5–8.1)

## 2017-02-01 LAB — CBC
HEMATOCRIT: 26.3 % — AB (ref 35.0–47.0)
HEMOGLOBIN: 8.5 g/dL — AB (ref 12.0–16.0)
MCH: 37.1 pg — AB (ref 26.0–34.0)
MCHC: 32.3 g/dL (ref 32.0–36.0)
MCV: 114.7 fL — AB (ref 80.0–100.0)
Platelets: 131 10*3/uL — ABNORMAL LOW (ref 150–440)
RBC: 2.3 MIL/uL — AB (ref 3.80–5.20)
RDW: 16.9 % — ABNORMAL HIGH (ref 11.5–14.5)
WBC: 6.9 10*3/uL (ref 3.6–11.0)

## 2017-02-01 LAB — AEROBIC CULTURE  (SUPERFICIAL SPECIMEN): SPECIAL REQUESTS: NORMAL

## 2017-02-01 LAB — AEROBIC CULTURE W GRAM STAIN (SUPERFICIAL SPECIMEN): Culture: NO GROWTH

## 2017-02-01 MED ORDER — ALBUMIN HUMAN 5 % IV SOLN: 25.0000 g | Freq: Once | INTRAVENOUS | Status: DC

## 2017-02-01 MED ORDER — ALBUMIN HUMAN 25 % IV SOLN
25.0000 g | Freq: Once | INTRAVENOUS | Status: AC
  Administered 2017-02-01: 25 g via INTRAVENOUS
  Filled 2017-02-01: qty 100

## 2017-02-01 NOTE — Plan of Care (Signed)
Problem: Health Behavior/Discharge Planning: Goal: Ability to manage health-related needs will improve Outcome: Not Progressing Patient needs assistance.   

## 2017-02-01 NOTE — Care Management Note (Addendum)
Case Management Note  Patient Details  Name: NIKERIA KALMAN MRN: [id number] Date of Birth: 10-19-1940  Subjective/Objective:         Call to [facility] regarding placement there for Mrs Elizondo. No insurance authorization yet. Anticipate decision about [employer]/Medicare authorization for a [facility] bed on Monday. Per [facility], [facility] Authorization Dept is closed on weekends.            Action/Plan:   Expected Discharge Date:                  Expected Discharge Plan:     In-House Referral:     Discharge planning Services     Post Acute Care Choice:    Choice offered to:     DME Arranged:    DME Agency:     HH Arranged:    HH Agency:     Status of Service:     If discussed at [employer] of Stay Meetings, dates discussed:    Additional Comments:  Kairon Shock A, RN 02/01/2017, 11:26 AM

## 2017-02-01 NOTE — Progress Notes (Signed)
Patient ID: NYSA SARIN, female   DOB: 07/28/1940, 76 y.o.   MRN: [id number]   [facility] PROGRESS NOTE  Sonya Park:[id number] DOB: 1940/11/12 DOA: 01/14/2017 PCP: Cletis Athens, MD  HPI/Subjective: Patient more awake today , able to speak and oriented. Patient still has diarrhea and has a rectal tube in place and has Foley in place Patient had NG tube taken out, she did eat breakfast this morning Patient unable to do much with physical therapy    Objective: Vitals:   01/31/17 2033 02/01/17 0419  BP: 115/65 102/80  Pulse: 93 72  Resp: 18 16  Temp: 98.1 F (36.7 C) (!) 97.4 F (36.3 C)  SpO2: 97% 100%    Filed Weights   01/30/17 0447 01/31/17 0500 02/01/17 0419  Weight: 64.4 kg (142 lb) 70.8 kg (156 lb) 71.2 kg (157 lb)    ROS: Review of Systems  Constitutional: Positive for malaise/fatigue. Negative for chills and fever.  HENT: Negative for congestion, ear pain and sore throat.   Respiratory: Negative for hemoptysis and shortness of breath.   Cardiovascular: Negative for chest pain, orthopnea and leg swelling.  Gastrointestinal: Negative for abdominal pain, blood in stool, diarrhea and nausea.  Genitourinary: Negative for frequency and urgency.  Musculoskeletal: Negative for joint pain and myalgias.  Skin: Negative for rash.  Neurological: Positive for weakness. Negative for tingling, tremors and focal weakness.  Psychiatric/Behavioral: The patient is not nervous/anxious.    Exam: Physical Exam  HENT:  Nose: No mucosal edema.  Mouth/Throat: No oropharyngeal exudate.  Eyes: Pupils are equal, round, and reactive to light. Lids are normal.  Icteric.   Neck: Carotid bruit is not present. No thyromegaly present.  Cardiovascular: Regular rhythm, S1 normal and S2 normal.   Respiratory: She has decreased breath sounds in the right lower field and the left lower field. She has no wheezes. She has no rhonchi. She has no rales.  GI: Soft. Bowel sounds are  normal. There is no tenderness.  Musculoskeletal:       Right elbow: She exhibits no swelling.       Left elbow: She exhibits no swelling.       Right wrist: She exhibits no swelling.       Left wrist: She exhibits no swelling.       Right knee: She exhibits no swelling.       Left knee: She exhibits no swelling.       Right ankle: She exhibits no swelling.  Lymphadenopathy:    She has no cervical adenopathy.  Neurological: She is alert.  Patient barely able to straight leg raise. Patient able to lift arms up off the bed  Skin:  Dressing in place  Psychiatric: She has a normal mood and affect.      Data Reviewed: Basic Metabolic Panel:  Recent Labs Lab 01/28/17 0441 01/29/17 1339 01/30/17 0602 01/31/17 1225 02/01/17 0535  NA 146* 145 143 140 141  K 3.1* 3.2* 3.4* 3.8 3.4*  CL 113* 113* 109 105 109  CO2 _0 GLUCOSE 125* 77 109* 93 80  BUN 36* 26* 29* 23* 21*  CREATININE 0.57 0.37* 0.34* <0.30* <0.30*  CALCIUM 7.7* 6.8* 7.4* 7.5* 7.4*   Liver Function Tests:  Recent Labs Lab 01/27/17 0538 01/29/17 1339 02/01/17 0535  AST 233* 126* 85*  ALT 116* 92* 62*  ALKPHOS 235* 167* 131*  BILITOT 4.4* 2.2* 2.1*  PROT 4.4* 3.9* 4.2*  ALBUMIN 1.8* 1.5* 2.1*  Recent Labs Lab 01/26/17 0439  AMMONIA 53*   CBC:  Recent Labs Lab 01/28/17 0441 01/29/17 1339 01/30/17 0602 01/31/17 1225 02/01/17 0535  WBC 17.4* 12.4* 11.2* 7.2 6.9  NEUTROABS  --   --   --  5.9  --   HGB 10.0* 8.6* 8.9* 7.4* 8.5*  HCT 31.9* 28.1* 28.5* 22.8* 26.3*  MCV 117.6* 116.3* 116.5* 114.7* 114.7*  PLT 202 187 183 131* 131*    CBG: No results for input(s): GLUCAP in the last 168 hours.  Recent Results (from the past 240 hour(s))  CULTURE, BLOOD (ROUTINE X 2) w Reflex to ID Panel     Status: None   Collection Time: 01/26/17  4:08 PM  Result Value Ref Range Status   Specimen Description BLOOD BLOOD LEFT WRIST  Final   Special Requests   Final    BOTTLES DRAWN AEROBIC AND  ANAEROBIC Blood Culture adequate volume   Culture NO GROWTH 5 DAYS  Final   Report Status 01/31/2017 FINAL  Final  CULTURE, BLOOD (ROUTINE X 2) w Reflex to ID Panel     Status: None   Collection Time: 01/26/17  4:08 PM  Result Value Ref Range Status   Specimen Description BLOOD BLOOD RIGHT WRIST  Final   Special Requests   Final    BOTTLES DRAWN AEROBIC AND ANAEROBIC Blood Culture results may not be optimal due to an inadequate volume of blood received in culture bottles   Culture NO GROWTH 5 DAYS  Final   Report Status 01/31/2017 FINAL  Final  Aerobic Culture (superficial specimen)     Status: None (Preliminary result)   Collection Time: 01/28/17  3:28 PM  Result Value Ref Range Status   Specimen Description WOUND  Final   Special Requests Normal  Final   Gram Stain   Final    MODERATE WBC PRESENT, PREDOMINANTLY PMN NO ORGANISMS SEEN    Culture   Final    NO GROWTH 3 DAYS Performed at [facility], [place] [address], [place], [place] [address]    Report Status PENDING  Incomplete     Studies: No results found.  Scheduled Meds: . bacitracin  1 application Topical BID  . chlorhexidine gluconate (MEDLINE KIT)  15 mL Mouth Rinse BID  . cholecalciferol  2,000 Units Oral Daily  . ciprofloxacin  500 mg Oral BID  . Copper Gluconate  1 tablet Oral Daily  . enoxaparin (LOVENOX) injection  40 mg Subcutaneous Q24H  . feeding supplement (ENSURE ENLIVE)  237 mL Oral BID BM  . folic acid  1 mg Oral Daily  . furosemide  20 mg Intravenous Q12H  . ipratropium-albuterol  3 mL Nebulization TID  . metroNIDAZOLE  500 mg Oral Q12H  . modafinil  100 mg Oral Daily  . multivitamin  15 mL Oral Daily  . phytonadione  10 mg Oral Daily  . potassium chloride  40 mEq Oral Daily  . rifaximin  550 mg Oral BID  . sodium chloride flush  10-40 mL Intracatheter Q12H  . thiamine  100 mg Oral Daily  . vitamin B-12  1,000 mcg Oral Daily  . zinc sulfate  220 mg Oral Daily   Continuous  Infusions:   Assessment/Plan: Patient is a 76 year old white female with alcoholic liver disease    1. Septic shock with Escherichia coli bacteremia.  status post treatment with Cipro Started on meropenem due to persistently elevated WBC count,  discontinue vancomycin since her blood cultures have been negative,  appreciate ID input   As improving- changed again to cipro and flagyl and suggest to give 3 more day and then stop on 02/03/17  2. Alcoholic liver disease with acute liver failure: patient's liver function are stable, however clinically patient has evidence of ascites on her ultrasound, severe hypoalbuminemia with third spacing-  given her albumin multiple doses , may need paracentesis in the near future  3. Acute respiratory failure with hypoxia. Patient extubated 01/22/2017. Currently on room air 4. Dysphasia with's sore throat and upper GI series shows no evidence of obstruction : patient's NG tube has been removed since it's being there for a long period of time, we will have to monitor her by mouth intake if she does not meet her caloric intake will need to place back to NG tube, slow improvement 5. Acute encephalopathy secondary to severe sepsis.  continue Xifaxan mental with some improvement continue Xifaxan and Provigil 6. Pancytopenia could be secondary to alcohol abuse and/or sepsis. Platelet count recovered 7. Hypomagnesemia, hypocalcemia and hypokalemia. replaced 8. Severe malnutrition, Anasarca and third spacing of fluids.  Patient has been receiving IV albumin since yesterday 9. Jaundice likely secondary to shock. Could also be secondary to prior alcohol abuse.jaundice improving   10. Facial trauma at home. Fractured nasal bones. Hematoma and bruising around the eyelids. 11. Anasarca continue low dose lasix 12. Prognosis very poor: patient still clinically ill, she needs aggressive management and would benefit from Barry working on approval.   Code Status:      Code Status Orders        Start     Ordered   01/15/17 0158  Full code  Continuous     01/15/17 0157    Code Status History    Date Active Date Inactive Code Status Order ID Comments User Context   12/13/2015 11:49 AM 12/15/2015  7:28 PM Full Code [id number]  Dereck Leep, MD Inpatient   09/17/2014  6:01 AM 09/18/2014  3:51 PM Full Code [id number]  Juluis Mire, MD Inpatient     Family Communication: spoke with husband at the bedside Disposition Plan: potential candidate for LTAC  Consultants:  Critical care specialist  Antibiotics:  Cipro, Flagyl  Time spent: 32 minutes  [place], [facility]

## 2017-02-01 NOTE — Progress Notes (Addendum)
Notified MD that portions of blisters on left leg have a black wound bed. Also large portion of right side looks like it is a blister. Per MD okay to reconsult wound care.

## 2017-02-01 NOTE — Progress Notes (Signed)
Jonathon Bellows , MD [address] [place], [place], [place], [address] [address], [place], [place], [place], [address] Phone: [phone]  Fax: [phone]   DEMIAH GULLICKSON is being followed for abnormal liver tests   Subjective: Feels better, less confused, eating better . No discomfort.    Objective: Vital signs in last 24 hours: Vitals:   01/31/17 1328 01/31/17 2033 02/01/17 0419 02/01/17 1500  BP:  115/65 102/80 110/62  Pulse:  93 72   Resp:  _0 Temp:  98.1 F (36.7 C) (!) 97.4 F (36.3 C) 97.7 F (36.5 C)  TempSrc:  Oral Oral Oral  SpO2: 93% 97% 100% 99%  Weight:   157 lb (71.2 kg)   Height:       Weight change: 1 lb (0.454 kg)  Intake/Output Summary (Last 24 hours) at 02/01/17 1557 Last data filed at 02/01/17 1315  Gross per 24 hour  Intake              440 ml  Output             3050 ml  Net            -2610 ml     Exam: Heart:: Regular rate and rhythm, S1S2 present or without murmur or extra heart sounds Lungs: normal, clear to auscultation and clear to auscultation and percussion Abdomen: soft, nontender, normal bowel sounds   Lab Results: _1 @ Micro Results: Recent Results (from the past 240 hour(s))  CULTURE, BLOOD (ROUTINE X 2) w Reflex to ID Panel     Status: None   Collection Time: 01/26/17  4:08 PM  Result Value Ref Range Status   Specimen Description BLOOD BLOOD LEFT WRIST  Final   Special Requests   Final    BOTTLES DRAWN AEROBIC AND ANAEROBIC Blood Culture adequate volume   Culture NO GROWTH 5 DAYS  Final   Report Status 01/31/2017 FINAL  Final  CULTURE, BLOOD (ROUTINE X 2) w Reflex to ID Panel     Status: None   Collection Time: 01/26/17  4:08 PM  Result Value Ref Range Status   Specimen Description BLOOD BLOOD RIGHT WRIST  Final   Special Requests   Final    BOTTLES DRAWN AEROBIC AND ANAEROBIC Blood Culture results may not be optimal due to an inadequate volume of blood received in culture bottles   Culture NO  GROWTH 5 DAYS  Final   Report Status 01/31/2017 FINAL  Final  Aerobic Culture (superficial specimen)     Status: None (Preliminary result)   Collection Time: 01/28/17  3:28 PM  Result Value Ref Range Status   Specimen Description WOUND  Final   Special Requests Normal  Final   Gram Stain   Final    MODERATE WBC PRESENT, PREDOMINANTLY PMN NO ORGANISMS SEEN    Culture   Final    NO GROWTH 3 DAYS Performed at [facility], [address], [place], [place] [address]    Report Status PENDING  Incomplete   Studies/Results: No results found. Medications: I have reviewed the patient's current medications. Scheduled Meds: . bacitracin  1 application Topical BID  . chlorhexidine gluconate (MEDLINE KIT)  15 mL Mouth Rinse BID  . cholecalciferol  2,000 Units Oral Daily  . ciprofloxacin  500 mg Oral BID  . Copper Gluconate  1 tablet Oral Daily  . enoxaparin (LOVENOX) injection  40 mg Subcutaneous Q24H  . feeding supplement (ENSURE ENLIVE)  237  mL Oral BID BM  . folic acid  1 mg Oral Daily  . furosemide  20 mg Intravenous Q12H  . ipratropium-albuterol  3 mL Nebulization TID  . metroNIDAZOLE  500 mg Oral Q12H  . modafinil  100 mg Oral Daily  . multivitamin  15 mL Oral Daily  . phytonadione  10 mg Oral Daily  . potassium chloride  40 mEq Oral Daily  . rifaximin  550 mg Oral BID  . sodium chloride flush  10-40 mL Intracatheter Q12H  . thiamine  100 mg Oral Daily  . vitamin B-12  1,000 mcg Oral Daily  . zinc sulfate  220 mg Oral Daily   Continuous Infusions: . albumin human     PRN Meds:.[DISCONTINUED] ondansetron **OR** ondansetron (ZOFRAN) IV, sodium chloride flush, traMADol   Assessment: Principal Problem:   Sepsis ([place]) Active Problems:   Facial trauma   Osteoporosis   Neutropenia (H[facility])   Alcohol abuse   Acute respiratory failure (H[facility])   Bacteremia   Palliative care by specialist   Goals of care, counseling/discussion   Pressure injury of skin    Hyperbilirubinemia   Alcoholic liver disease (H[facility])   Thrombocytopenia ([place])   Anasarca   LFT elevation   Severe malnutrition (H[facility])   Adult failure to thrive   Mattia KANI JOBSON is a 76 y.o. y/o female with who has been admitted with possible alcoholic hepatitis this thrombocytopenia elevated bilirubin.  Liver function tests are rapidly improving, at this point there is no evidence of liver failure.  The abnormal liver function tests may have been from a combination of alcohol as well as sepsis, she is being presently treated for E. coli sepsis with antibiotics.  History of gastric bypass.  Plan 1.  Daily weights 2.  Low-salt diet to prevent accumulation of ascites 3.  Recheck PT/INR 4.  Stop all alcohol consumption 5.  Check B12 folate ferritin TSH, copper, iron studies for macrocytic anemia.  She also has a History of a gastric bypass and may be deficient in these nutrients 6.  IV thiamine if not received to prevent wernickes encephalopathy 7. No encephelopathy presently -continue xifaxan. Not on lactulose, has some diarrhea- if persists check stool PCR and for c diff.   Dr Allen Norris will follow tomorrow    LOS: 37 days   Jonathon Bellows, MD 02/01/2017, 3:57 PM

## 2017-02-02 DIAGNOSIS — R945 Abnormal results of liver function studies: Secondary | ICD-10-CM

## 2017-02-02 LAB — IRON: IRON: 62 ug/dL (ref 28–170)

## 2017-02-02 LAB — COMPREHENSIVE METABOLIC PANEL
ALT: 52 U/L (ref 14–54)
AST: 70 U/L — ABNORMAL HIGH (ref 15–41)
Albumin: 2.2 g/dL — ABNORMAL LOW (ref 3.5–5.0)
Alkaline Phosphatase: 98 U/L (ref 38–126)
Anion gap: 4 — ABNORMAL LOW (ref 5–15)
BUN: 20 mg/dL (ref 6–20)
CHLORIDE: 107 mmol/L (ref 101–111)
CO2: 29 mmol/L (ref 22–32)
Calcium: 7.6 mg/dL — ABNORMAL LOW (ref 8.9–10.3)
Creatinine, Ser: 0.3 mg/dL — ABNORMAL LOW (ref 0.44–1.00)
GFR calc non Af Amer: >60 mL/min (ref >60)
Glucose, Bld: 86 mg/dL (ref 65–99)
Potassium: 3.6 mmol/L (ref 3.5–5.1)
SODIUM: 140 mmol/L (ref 135–145)
Total Bilirubin: 1.5 mg/dL — ABNORMAL HIGH (ref 0.3–1.2)
Total Protein: 4 g/dL — ABNORMAL LOW (ref 6.5–8.1)

## 2017-02-02 LAB — CBC
HEMATOCRIT: 24.4 % — AB (ref 35.0–47.0)
Hemoglobin: 8 g/dL — ABNORMAL LOW (ref 12.0–16.0)
MCH: 37.4 pg — ABNORMAL HIGH (ref 26.0–34.0)
MCHC: 32.8 g/dL (ref 32.0–36.0)
MCV: 114.1 fL — ABNORMAL HIGH (ref 80.0–100.0)
PLATELETS: 113 10*3/uL — AB (ref 150–440)
RBC: 2.14 MIL/uL — ABNORMAL LOW (ref 3.80–5.20)
RDW: 17 % — AB (ref 11.5–14.5)
WBC: 6 10*3/uL (ref 3.6–11.0)

## 2017-02-02 LAB — C DIFFICILE QUICK SCREEN W PCR REFLEX
C Diff antigen: NEGATIVE
C Diff interpretation: NOT DETECTED
C Diff toxin: NEGATIVE

## 2017-02-02 LAB — TSH: TSH: 4.603 u[IU]/mL — AB (ref 0.350–4.500)

## 2017-02-02 LAB — FERRITIN: Ferritin: 404 ng/mL — ABNORMAL HIGH (ref 11–307)

## 2017-02-02 LAB — FOLATE: Folate: 27 ng/mL (ref >5.9)

## 2017-02-02 MED ORDER — COLLAGENASE 250 UNIT/GM EX OINT
TOPICAL_OINTMENT | Freq: Every day | CUTANEOUS | Status: DC
  Administered 2017-02-02 – 2017-02-11 (×10): via TOPICAL
  Filled 2017-02-02 (×2): qty 30

## 2017-02-02 NOTE — Progress Notes (Signed)
Patient ID: Sonya Park, female   DOB: 08/09/1940, 76 y.o.   MRN: [id number]   [facility] PROGRESS NOTE  Sonya Park EVO:[id number] DOB: 1941/01/19 DOA: 01/14/2017 PCP: Cletis Athens, MD  HPI/Subjective: Patient more awake today , able to speak and oriented. Patient still has diarrhea and has a rectal tube in place and has Foley in place Patient had NG tube taken out, she did eat breakfast this morning Patient unable to do much with physical therapy  Still have significant swelling and some fluid filled bulla on her skin.  Objective: Vitals:   02/02/17 0757 02/02/17 1502  BP:  116/67  Pulse:  (!) 104  Resp:  20  Temp:  98.3 F (36.8 C)  SpO2: 94% 96%    Filed Weights   01/31/17 0500 02/01/17 0419 02/02/17 0502  Weight: 70.8 kg (156 lb) 71.2 kg (157 lb) 61.7 kg (136 lb)    ROS: Review of Systems  Constitutional: Positive for malaise/fatigue. Negative for chills and fever.  HENT: Negative for congestion, ear pain and sore throat.   Respiratory: Negative for hemoptysis and shortness of breath.   Cardiovascular: Negative for chest pain, orthopnea and leg swelling.  Gastrointestinal: Negative for abdominal pain, blood in stool, diarrhea and nausea.  Genitourinary: Negative for frequency and urgency.  Musculoskeletal: Negative for joint pain and myalgias.  Skin: Negative for rash.  Neurological: Positive for weakness. Negative for tingling, tremors and focal weakness.  Psychiatric/Behavioral: The patient is not nervous/anxious.    Exam: Physical Exam  HENT:  Nose: No mucosal edema.  Mouth/Throat: No oropharyngeal exudate.  Eyes: Pupils are equal, round, and reactive to light. Lids are normal.  Icteric.   Neck: Carotid bruit is not present. No thyromegaly present.  Cardiovascular: Regular rhythm, S1 normal and S2 normal.   Respiratory: She has decreased breath sounds in the right lower field and the left lower field. She has no wheezes. She has no rhonchi. She  has no rales.  GI: Soft. Bowel sounds are normal. There is no tenderness.  Musculoskeletal:       Right elbow: She exhibits no swelling.       Left elbow: She exhibits no swelling.       Right wrist: She exhibits no swelling.       Left wrist: She exhibits no swelling.       Right knee: She exhibits no swelling.       Left knee: She exhibits no swelling.       Right ankle: She exhibits no swelling.  Lymphadenopathy:    She has no cervical adenopathy.  Neurological: She is alert.  Patient barely able to straight leg raise. Patient able to lift arms up off the bed  Skin:  Dressing in place  Psychiatric: She has a normal mood and affect.      Data Reviewed: Basic Metabolic Panel:  Recent Labs Lab 01/29/17 1339 01/30/17 0602 01/31/17 1225 02/01/17 0535 02/02/17 0345  NA 145 143 140 141 140  K 3.2* 3.4* 3.8 3.4* 3.6  CL 113* 109 105 109 107  CO2 '27 30 27 28 29  '$ GLUCOSE 77 109* 93 80 86  BUN 26* 29* 23* 21* 20  CREATININE 0.37* 0.34* <0.30* <0.30* 0.30*  CALCIUM 6.8* 7.4* 7.5* 7.4* 7.6*   Liver Function Tests:  Recent Labs Lab 01/27/17 0538 01/29/17 1339 02/01/17 0535 02/02/17 0345  AST 233* 126* 85* 70*  ALT 116* 92* 62* 52  ALKPHOS 235* 167* 131* 98  BILITOT  4.4* 2.2* 2.1* 1.5*  PROT 4.4* 3.9* 4.2* 4.0*  ALBUMIN 1.8* 1.5* 2.1* 2.2*   No results for input(s): AMMONIA in the last 168 hours. CBC:  Recent Labs Lab 01/29/17 1339 01/30/17 0602 01/31/17 1225 02/01/17 0535 02/02/17 0345  WBC 12.4* 11.2* 7.2 6.9 6.0  NEUTROABS  --   --  5.9  --   --   HGB 8.6* 8.9* 7.4* 8.5* 8.0*  HCT 28.1* 28.5* 22.8* 26.3* 24.4*  MCV 116.3* 116.5* 114.7* 114.7* 114.1*  PLT 187 183 131* 131* 113*    CBG: No results for input(s): GLUCAP in the last 168 hours.  Recent Results (from the past 240 hour(s))  CULTURE, BLOOD (ROUTINE X 2) w Reflex to ID Panel     Status: None   Collection Time: 01/26/17  4:08 PM  Result Value Ref Range Status   Specimen Description BLOOD  BLOOD LEFT WRIST  Final   Special Requests   Final    BOTTLES DRAWN AEROBIC AND ANAEROBIC Blood Culture adequate volume   Culture NO GROWTH 5 DAYS  Final   Report Status 01/31/2017 FINAL  Final  CULTURE, BLOOD (ROUTINE X 2) w Reflex to ID Panel     Status: None   Collection Time: 01/26/17  4:08 PM  Result Value Ref Range Status   Specimen Description BLOOD BLOOD RIGHT WRIST  Final   Special Requests   Final    BOTTLES DRAWN AEROBIC AND ANAEROBIC Blood Culture results may not be optimal due to an inadequate volume of blood received in culture bottles   Culture NO GROWTH 5 DAYS  Final   Report Status 01/31/2017 FINAL  Final  Aerobic Culture (superficial specimen)     Status: None   Collection Time: 01/28/17  3:28 PM  Result Value Ref Range Status   Specimen Description WOUND  Final   Special Requests Normal  Final   Gram Stain   Final    MODERATE WBC PRESENT, PREDOMINANTLY PMN NO ORGANISMS SEEN    Culture   Final    NO GROWTH 3 DAYS Performed at [facility], [address] [place], [place] [address]    Report Status 02/01/2017 FINAL  Final     Studies: No results found.  Scheduled Meds: . bacitracin  1 application Topical BID  . chlorhexidine gluconate (MEDLINE KIT)  15 mL Mouth Rinse BID  . cholecalciferol  2,000 Units Oral Daily  . ciprofloxacin  500 mg Oral BID  . collagenase   Topical Daily  . Copper Gluconate  1 tablet Oral Daily  . enoxaparin (LOVENOX) injection  40 mg Subcutaneous Q24H  . feeding supplement (ENSURE ENLIVE)  237 mL Oral BID BM  . folic acid  1 mg Oral Daily  . furosemide  20 mg Intravenous Q12H  . ipratropium-albuterol  3 mL Nebulization TID  . metroNIDAZOLE  500 mg Oral Q12H  . modafinil  100 mg Oral Daily  . multivitamin  15 mL Oral Daily  . potassium chloride  40 mEq Oral Daily  . rifaximin  550 mg Oral BID  . sodium chloride flush  10-40 mL Intracatheter Q12H  . thiamine  100 mg Oral Daily  . vitamin B-12  1,000 mcg Oral Daily  .  zinc sulfate  220 mg Oral Daily   Continuous Infusions:   Assessment/Plan: Patient is a 76 year old white female with alcoholic liver disease    1. Septic shock with Escherichia coli bacteremia.  status post treatment with Cipro Started on meropenem  due to persistently elevated WBC count,  discontinue vancomycin since her blood cultures have been negative, appreciate ID input   As improving- changed again to cipro and flagyl and suggest to give 3 more day and then stop on 02/03/17  2. Alcoholic liver disease with acute liver failure: patient's liver function are stable, however clinically patient has evidence of ascites on her ultrasound, severe hypoalbuminemia with third spacing-  given her albumin multiple doses ,        Improving slowly. 3. Acute respiratory failure with hypoxia. Patient extubated 01/22/2017. Currently on room air 4. Dysphasia with's sore throat and upper GI series shows no evidence of obstruction : patient's NG tube has been removed since it's being there for a long period of time, we will have to monitor her by mouth intake if she does not meet her caloric intake will need to place back to NG tube, slow improvement 5. Acute encephalopathy secondary to severe sepsis.  continue Xifaxan mental with some improvement continue Xifaxan and Provigil       Improved. 6. Pancytopenia could be secondary to alcohol abuse and/or sepsis. Platelet count recovered 7. Hypomagnesemia, hypocalcemia and hypokalemia. replaced 8. Severe malnutrition, Anasarca and third spacing of fluids.  Patient has been receiving IV albumin since yesterday 9. Jaundice likely secondary to shock. Could also be secondary to prior alcohol abuse.jaundice improving   10. Facial trauma at home. Fractured nasal bones. Hematoma and bruising around the eyelids. 11. Anasarca continue low dose lasix 12. Prognosis very poor: patient still clinically ill, she needs aggressive management and would benefit from [facility] working on approval.  Her PICC line is not working today, will replace as she may need that at [employer]  Code Status:     Code Status Orders        Start     Ordered   01/15/17 0158  Full code  Continuous     01/15/17 0157    Code Status History    Date Active Date Inactive Code Status Order ID Comments User Context   12/13/2015 11:49 AM 12/15/2015  7:28 PM Full Code [id number]  Dereck Leep, MD Inpatient   09/17/2014  6:01 AM 09/18/2014  3:51 PM Full Code [id number]  Juluis Mire, MD Inpatient     Family Communication: spoke with husband at the bedside Disposition Plan: potential candidate for LTAC   Consultants:  Critical care specialist  Antibiotics:  Cipro, Flagyl  Time spent: 32 minutes  [place], [facility]

## 2017-02-02 NOTE — Consult Note (Signed)
[facility] Nurse wound follow up: Asked to reassess prior to transfer expected in next several days. New PrI noted today. Bedside RN and husband (as well as Dr. Allen Norris) present for all or part of my assessment. 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  Measurement: Left medial heel deep tissue pressure injury measuring 3cm x 4cm with deep purple/maroon discoloration beneath the tissue.  Surface skin is intact,.  Presentation is consistent with a deep tissue pressure injury (DTPI).  Expect worsening prior to improvement in these ulcers, although it is possible to have them resolve without opening in a small percentage of the time. POC for painting the DTPI with betadine solution and allowing to air dry prior to dressing and floatation of heel(s) using Prevalon Boots is implemented today. Wound bed: as described above Drainage (amount, consistency, odor) None Periwound: Intat, dry.  Other wounds on this extremity are previously documented by my partner, K. Sanders.  Dressing procedure/placement/frequency: Husband and bedside RN are concerned with adherence of dressings to wounds, also black eschar in two places on dorsal foot and three places on left medial LE. Will add administration of collagenase (Santyl) to the nonviable tissue and change the topical dressing to a non-adherent (Mepitel One). This will be covered with ABD pads on the anterior and posterior aspects of the LE and dry gauze 4x4 on the anterior foot; all to be secured with kerlix roll gauze/paper tape.   Right ribcage assessment was included in consultation order:  This area (as well as the left ribcage area) present with fluid beneath the tissues and a yellow appearance to the surface tissue. GI MD in to see while I was performing my assessment and he reports to the husband and patient that the liver enzymes are normalizing. OOB with therapy will enhance  fluid shifting.  For this purpose and to prevent pressure injury, I will provide a pressure redistribution chair pad that she can take with her upon discharge.  Sacral areas Stage 2 Pressure injury at the coccyx is noted, measures 2.6 x 3.0 x 0.1 and is in the vicinity of two partial thickness open areas related to moisture associated skin damage. A silicone foam dressing is in place, but patient requires aggressive pressure redistribution (turning and repositioning) in addition to microclimate control even in the short term. While we are expecting transfer to a LTAC this week, we will implement a mattress replacement with low air loss feature today.  [facility] nursing team will not follow routinely, but will remain available to this patient, the nursing and medical teams.  Please re-consult if needed. Thanks, Maudie Flakes, MSN, RN, [place], Arther Abbott  Pager# [phone]  Time spent in consultation = 75 minutes

## 2017-02-02 NOTE — Progress Notes (Signed)
Sonya Lame, MD [facility]   [address], [place], [place] [address] Phone: [phone] Fax : [phone]   Subjective: The patient's liver enzymes are coming back to normal with only the AST being elevated now.  The patient's alkaline phosphatase and ALT are back to normal.  The patient is tolerating a p.o. diet without complaints.  There is no report of any discomfort.  The patient is also less confused.   Objective: Vital signs in last 24 hours: Vitals:   02/01/17 2020 02/02/17 0454 02/02/17 0502 02/02/17 0757  BP: 118/67 132/74 (!) 151/74   Pulse: (!) 104 93 71   Resp: _0 Temp: 98.3 F (36.8 C) 98.8 F (37.1 C) 98.4 F (36.9 C)   TempSrc: Oral Oral Oral   SpO2: 96% 96% 93% 94%  Weight:   136 lb (61.7 kg)   Height:       Weight change: -21 lb (-9.526 kg)  Intake/Output Summary (Last 24 hours) at 02/02/17 1027 Last data filed at 02/02/17 1008  Gross per 24 hour  Intake              240 ml  Output             3375 ml  Net            -3135 ml     Exam: Heart:: Regular rate and rhythm, S1S2 present or without murmur or extra heart sounds Lungs: normal and clear to auscultation and percussion Abdomen: Soft nontender nondistended normal bowel sounds.   Lab Results: _1 @ Micro Results: Recent Results (from the past 240 hour(s))  CULTURE, BLOOD (ROUTINE X 2) w Reflex to ID Panel     Status: None   Collection Time: 01/26/17  4:08 PM  Result Value Ref Range Status   Specimen Description BLOOD BLOOD LEFT WRIST  Final   Special Requests   Final    BOTTLES DRAWN AEROBIC AND ANAEROBIC Blood Culture adequate volume   Culture NO GROWTH 5 DAYS  Final   Report Status 01/31/2017 FINAL  Final  CULTURE, BLOOD (ROUTINE X 2) w Reflex to ID Panel     Status: None   Collection Time: 01/26/17  4:08 PM  Result Value Ref Range Status   Specimen Description BLOOD BLOOD RIGHT WRIST  Final   Special Requests   Final    BOTTLES DRAWN AEROBIC AND ANAEROBIC Blood  Culture results may not be optimal due to an inadequate volume of blood received in culture bottles   Culture NO GROWTH 5 DAYS  Final   Report Status 01/31/2017 FINAL  Final  Aerobic Culture (superficial specimen)     Status: None   Collection Time: 01/28/17  3:28 PM  Result Value Ref Range Status   Specimen Description WOUND  Final   Special Requests Normal  Final   Gram Stain   Final    MODERATE WBC PRESENT, PREDOMINANTLY PMN NO ORGANISMS SEEN    Culture   Final    NO GROWTH 3 DAYS Performed at [facility], [address], [place], [place] [address]    Report Status 02/01/2017 FINAL  Final   Studies/Results: No results found. Medications: I have reviewed the patient's current medications. Scheduled Meds: . bacitracin  1 application Topical BID  . chlorhexidine gluconate (MEDLINE KIT)  15 mL Mouth Rinse BID  . cholecalciferol  2,000 Units Oral Daily  . ciprofloxacin  500 mg Oral BID  . Copper Gluconate  1 tablet  Oral Daily  . enoxaparin (LOVENOX) injection  40 mg Subcutaneous Q24H  . feeding supplement (ENSURE ENLIVE)  237 mL Oral BID BM  . folic acid  1 mg Oral Daily  . furosemide  20 mg Intravenous Q12H  . ipratropium-albuterol  3 mL Nebulization TID  . metroNIDAZOLE  500 mg Oral Q12H  . modafinil  100 mg Oral Daily  . multivitamin  15 mL Oral Daily  . potassium chloride  40 mEq Oral Daily  . rifaximin  550 mg Oral BID  . sodium chloride flush  10-40 mL Intracatheter Q12H  . thiamine  100 mg Oral Daily  . vitamin B-12  1,000 mcg Oral Daily  . 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  She is now tolerating p.o.'s.  The patient has been told to avoid any further drinking in the future.  The patient's prognosis is good if she continues to abstain from alcohol from a GI point of view.  Nothing further to do at this time from GI. I will sign off.  Please call if any further GI concerns or questions.  We would like to thank you for the opportunity to participate in the care of [employer].    LOS: 19 days   [name] 02/02/2017, 10:27 AM

## 2017-02-02 NOTE — Progress Notes (Signed)
Notified Dr. Anselm Jungling that patient refused to finish her potassioum dose this AM. Pt having loose stool through rectal tube okay to place order to send sample for C Diff as well as enteric precaution until it comes back. Also notified MD that patient has PICC line that will not flush. Per MD place order for IV team for new PICC.

## 2017-02-02 NOTE — Progress Notes (Signed)
No IV team today to replace PICC line. Per Dr. Molli Hazard okay to have IV PICC placed tomorrow as pt now has peripheral access. Will remove current PICC line that is clotted.

## 2017-02-03 LAB — CBC
HCT: 28.9 % — ABNORMAL LOW (ref 35.0–47.0)
Hemoglobin: 9.5 g/dL — ABNORMAL LOW (ref 12.0–16.0)
MCH: 37 pg — ABNORMAL HIGH (ref 26.0–34.0)
MCHC: 32.8 g/dL (ref 32.0–36.0)
MCV: 112.8 fL — ABNORMAL HIGH (ref 80.0–100.0)
PLATELETS: 111 10*3/uL — AB (ref 150–440)
RBC: 2.56 MIL/uL — AB (ref 3.80–5.20)
RDW: 16.8 % — ABNORMAL HIGH (ref 11.5–14.5)
WBC: 5.7 10*3/uL (ref 3.6–11.0)

## 2017-02-03 LAB — VITAMIN B12: Vitamin B-12: 4059 pg/mL — ABNORMAL HIGH (ref 180–914)

## 2017-02-03 MED ORDER — ENSURE ENLIVE PO LIQD
237.0000 mL | Freq: Three times a day (TID) | ORAL | Status: DC
  Administered 2017-02-03 – 2017-02-11 (×23): 237 mL via ORAL

## 2017-02-03 MED ORDER — VITAMIN C 500 MG PO TABS
500.0000 mg | ORAL_TABLET | Freq: Every day | ORAL | Status: DC
  Administered 2017-02-03 – 2017-02-11 (×9): 500 mg via ORAL
  Filled 2017-02-03 (×9): qty 1

## 2017-02-03 MED FILL — Copper Gluconate Tab 2 MG: ORAL | Qty: 1 | Status: AC

## 2017-02-03 NOTE — Progress Notes (Signed)
[facility] INFECTIOUS DISEASE PROGRESS NOTE Date of Admission:  01/14/2017     ID: Sonya Park is a 76 y.o. female with  E coli bacteremia and persistently elevated WBC, LFTs Principal Problem:   Sepsis ([facility]) Active Problems:   Facial trauma   Osteoporosis   Neutropenia (HCC)   Alcohol abuse   Acute respiratory failure (HCC)   Bacteremia   Palliative care by specialist   Goals of care, counseling/discussion   Pressure injury of skin   Hyperbilirubinemia   Alcoholic liver failure (HCC)   Thrombocytopenia (HCC)   Anasarca   LFT elevation   Severe malnutrition ([place])   Adult failure to thrive   Subjective: No fevers, wbc down. Much more alert, sat at side of bed per husband, eating more.  Stopped abx 10/29  ROS  Unable to obtain    Medications:  Antibiotics Given (last 72 hours)    Date/Time Action Medication Dose   01/31/17 2226 Given   rifaximin (XIFAXAN) tablet 550 mg 550 mg   01/31/17 2226 Given   metroNIDAZOLE (FLAGYL) tablet 500 mg 500 mg   01/31/17 2226 Given   ciprofloxacin (CIPRO) tablet 500 mg 500 mg   02/01/17 0841 Given   ciprofloxacin (CIPRO) tablet 500 mg 500 mg   02/01/17 1115 Given   rifaximin (XIFAXAN) tablet 550 mg 550 mg   02/01/17 1117 Given   metroNIDAZOLE (FLAGYL) tablet 500 mg 500 mg   02/01/17 2054 Given   ciprofloxacin (CIPRO) tablet 500 mg 500 mg   02/01/17 2334 Given   metroNIDAZOLE (FLAGYL) tablet 500 mg 500 mg   02/01/17 2335 Given   rifaximin (XIFAXAN) tablet 550 mg 550 mg   02/02/17 1005 Given   metroNIDAZOLE (FLAGYL) tablet 500 mg 500 mg   02/02/17 1006 Given   rifaximin (XIFAXAN) tablet 550 mg 550 mg   02/02/17 1007 Given   ciprofloxacin (CIPRO) tablet 500 mg 500 mg   02/02/17 1928 Given   ciprofloxacin (CIPRO) tablet 500 mg 500 mg   02/02/17 2213 Given   rifaximin (XIFAXAN) tablet 550 mg 550 mg   02/02/17 2213 Given   metroNIDAZOLE (FLAGYL) tablet 500 mg 500 mg   02/03/17 0751 Given   ciprofloxacin (CIPRO) tablet  500 mg 500 mg   02/03/17 1021 Given   metroNIDAZOLE (FLAGYL) tablet 500 mg 500 mg   02/03/17 1022 Given   rifaximin (XIFAXAN) tablet 550 mg 550 mg     . bacitracin  1 application Topical BID  . chlorhexidine gluconate (MEDLINE KIT)  15 mL Mouth Rinse BID  . cholecalciferol  2,000 Units Oral Daily  . ciprofloxacin  500 mg Oral BID  . collagenase   Topical Daily  . Copper Gluconate  1 tablet Oral Daily  . enoxaparin (LOVENOX) injection  40 mg Subcutaneous Q24H  . feeding supplement (ENSURE ENLIVE)  237 mL Oral BID BM  . folic acid  1 mg Oral Daily  . furosemide  20 mg Intravenous Q12H  . ipratropium-albuterol  3 mL Nebulization TID  . metroNIDAZOLE  500 mg Oral Q12H  . modafinil  100 mg Oral Daily  . multivitamin  15 mL Oral Daily  . potassium chloride  40 mEq Oral Daily  . rifaximin  550 mg Oral BID  . sodium chloride flush  10-40 mL Intracatheter Q12H  . thiamine  100 mg Oral Daily  . vitamin B-12  1,000 mcg Oral Daily  . zinc sulfate  220 mg Oral Daily    Objective: Vital signs in  last 24 hours: Temp:  [97.6 F (36.4 C)-98.5 F (36.9 C)] 98.2 F (36.8 C) (10/29 1154) Pulse Rate:  [90-110] 101 (10/29 1154) Resp:  [16-18] 18 (10/29 0747) BP: (104-146)/(65-84) 104/65 (10/29 1154) SpO2:  [94 %-98 %] 96 % (10/29 1154) Weight:  [74.4 kg (164 lb)] 74.4 kg (164 lb) (10/29 0439) Constitutional:  Thin, frail, ecchymosis over face  HENT: [place]/AT, PERRLA, + jaundiced Mouth/Throat: lips cracked and  dry Cardiovascular: Normal rate, regular rhythm and normal heart sounds.  Pulmonary/Chest: poor air movement Neck = supple, no nuchal rigidity Abdominal: Soft. Bowel sounds are normal.  Obese, ? Grimaces in RUQ palp Lymphadenopathy: no cervical adenopathy. No axillary adenopathy Neurological: alert and interactive but slowed mentation  Ext -anasarca Skin: L leg covered with dressing Psychiatric:lethargic  Lab Results  Recent Labs  02/01/17 0535 02/02/17 0345 02/03/17 0544   WBC 6.9 6.0 5.7  HGB 8.5* 8.0* 9.5*  HCT 26.3* 24.4* 28.9*  NA 141 140  --   K 3.4* 3.6  --   CL 109 107  --   CO2 28 29  --   BUN 21* 20  --   CREATININE <0.30* 0.30*  --     Microbiology: Results for orders placed or performed during the hospital encounter of 01/14/17  Blood Culture (routine x 2)     Status: Abnormal   Collection Time: 01/14/17 11:08 PM  Result Value Ref Range Status   Specimen Description BLOOD LFOA  Final   Special Requests   Final    BOTTLES DRAWN AEROBIC AND ANAEROBIC Blood Culture results may not be optimal due to an excessive volume of blood received in culture bottles   Culture  Setup Time   Final    GRAM NEGATIVE RODS IN BOTH AEROBIC AND ANAEROBIC BOTTLES CRITICAL RESULT CALLED TO, READ BACK BY AND VERIFIED WITH:  MICHAEL SIMPSON AT 1207 01/15/17 SDR    Culture ESCHERICHIA COLI (A)  Final   Report Status 01/17/2017 FINAL  Final   Organism ID, Bacteria ESCHERICHIA COLI  Final      Susceptibility   Escherichia coli - MIC*    AMPICILLIN >=32 RESISTANT Resistant     CEFAZOLIN <=4 SENSITIVE Sensitive     CEFEPIME <=1 SENSITIVE Sensitive     CEFTAZIDIME <=1 SENSITIVE Sensitive     CEFTRIAXONE <=1 SENSITIVE Sensitive     CIPROFLOXACIN 1 SENSITIVE Sensitive     GENTAMICIN <=1 SENSITIVE Sensitive     IMIPENEM <=0.25 SENSITIVE Sensitive     TRIMETH/SULFA >=320 RESISTANT Resistant     AMPICILLIN/SULBACTAM >=32 RESISTANT Resistant     PIP/TAZO <=4 SENSITIVE Sensitive     Extended ESBL NEGATIVE Sensitive     * ESCHERICHIA COLI  Blood Culture (routine x 2)     Status: Abnormal   Collection Time: 01/14/17 11:08 PM  Result Value Ref Range Status   Specimen Description BLOOD LAC  Final   Special Requests   Final    BOTTLES DRAWN AEROBIC AND ANAEROBIC Blood Culture results may not be optimal due to an excessive volume of blood received in culture bottles   Culture  Setup Time   Final    GRAM NEGATIVE RODS IN BOTH AEROBIC AND ANAEROBIC BOTTLES CRITICAL  VALUE NOTED.  VALUE IS CONSISTENT WITH PREVIOUSLY REPORTED AND CALLED VALUE.    Culture (A)  Final    ESCHERICHIA COLI SUSCEPTIBILITIES PERFORMED ON PREVIOUS CULTURE WITHIN THE LAST 5 DAYS. Performed at [facility], [address] [place], [place] [address]  Report Status 01/17/2017 FINAL  Final  Urine culture     Status: Abnormal   Collection Time: 01/14/17 11:08 PM  Result Value Ref Range Status   Specimen Description URINE, RANDOM  Final   Special Requests NONE  Final   Culture (A)  Final    <10,000 COLONIES/mL INSIGNIFICANT GROWTH Performed at [facility], [place] [address] [place], [place] [address]    Report Status 01/16/2017 FINAL  Final  Blood Culture ID Panel (Reflexed)     Status: Abnormal   Collection Time: 01/14/17 11:08 PM  Result Value Ref Range Status   Enterococcus species NOT DETECTED NOT DETECTED Final   Listeria monocytogenes NOT DETECTED NOT DETECTED Final   Staphylococcus species NOT DETECTED NOT DETECTED Final   Staphylococcus aureus NOT DETECTED NOT DETECTED Final   Streptococcus species NOT DETECTED NOT DETECTED Final   Streptococcus agalactiae NOT DETECTED NOT DETECTED Final   Streptococcus pneumoniae NOT DETECTED NOT DETECTED Final   Streptococcus pyogenes NOT DETECTED NOT DETECTED Final   Acinetobacter baumannii NOT DETECTED NOT DETECTED Final   Enterobacteriaceae species DETECTED (A) NOT DETECTED Final    Comment: Enterobacteriaceae represent a large family of gram-negative bacteria, not a single organism. CRITICAL RESULT CALLED TO, READ BACK BY AND VERIFIED WITH:  MICHAEL SIMPSON AT 1207 01/15/17 SDR    Enterobacter cloacae complex NOT DETECTED NOT DETECTED Final   Escherichia coli DETECTED (A) NOT DETECTED Final    Comment: CRITICAL RESULT CALLED TO, READ BACK BY AND VERIFIED WITH:  MICHAEL SIMPSON AT 1207 01/15/17 SDR    Klebsiella oxytoca NOT DETECTED NOT DETECTED Final   Klebsiella pneumoniae NOT DETECTED NOT DETECTED Final    Proteus species NOT DETECTED NOT DETECTED Final   Serratia marcescens NOT DETECTED NOT DETECTED Final   Carbapenem resistance NOT DETECTED NOT DETECTED Final   Haemophilus influenzae NOT DETECTED NOT DETECTED Final   Neisseria meningitidis NOT DETECTED NOT DETECTED Final   Pseudomonas aeruginosa NOT DETECTED NOT DETECTED Final   Candida albicans NOT DETECTED NOT DETECTED Final   Candida glabrata NOT DETECTED NOT DETECTED Final   Candida krusei NOT DETECTED NOT DETECTED Final   Candida parapsilosis NOT DETECTED NOT DETECTED Final   Candida tropicalis NOT DETECTED NOT DETECTED Final  MRSA PCR Screening     Status: None   Collection Time: 01/20/17 11:04 AM  Result Value Ref Range Status   MRSA by PCR NEGATIVE NEGATIVE Final    Comment:        The GeneXpert MRSA Assay (FDA approved for NASAL specimens only), is one component of a comprehensive MRSA colonization surveillance program. It is not intended to diagnose MRSA infection nor to guide or monitor treatment for MRSA infections.   CULTURE, BLOOD (ROUTINE X 2) w Reflex to ID Panel     Status: None   Collection Time: 01/26/17  4:08 PM  Result Value Ref Range Status   Specimen Description BLOOD BLOOD LEFT WRIST  Final   Special Requests   Final    BOTTLES DRAWN AEROBIC AND ANAEROBIC Blood Culture adequate volume   Culture NO GROWTH 5 DAYS  Final   Report Status 01/31/2017 FINAL  Final  CULTURE, BLOOD (ROUTINE X 2) w Reflex to ID Panel     Status: None   Collection Time: 01/26/17  4:08 PM  Result Value Ref Range Status   Specimen Description BLOOD BLOOD RIGHT WRIST  Final   Special Requests   Final    BOTTLES DRAWN AEROBIC AND ANAEROBIC Blood  Culture results may not be optimal due to an inadequate volume of blood received in culture bottles   Culture NO GROWTH 5 DAYS  Final   Report Status 01/31/2017 FINAL  Final  Aerobic Culture (superficial specimen)     Status: None   Collection Time: 01/28/17  3:28 PM  Result Value Ref  Range Status   Specimen Description WOUND  Final   Special Requests Normal  Final   Gram Stain   Final    MODERATE WBC PRESENT, PREDOMINANTLY PMN NO ORGANISMS SEEN    Culture   Final    NO GROWTH 3 DAYS Performed at [facility], [address] [place], [place] [address]    Report Status 02/01/2017 FINAL  Final  C difficile quick screen w PCR reflex     Status: None   Collection Time: 02/02/17 12:32 PM  Result Value Ref Range Status   C Diff antigen NEGATIVE NEGATIVE Final   C Diff toxin NEGATIVE NEGATIVE Final   C Diff interpretation No C. difficile detected.  Final    Studies/Results: No results found.  Assessment/Plan: Sonya Park is a 76 y.o. female admitted after a fall and found to have E coli bacteremia. She initially was septic but is out of unit now. WBC peaked at 32. Was initially on only cipro for the E coli bacteremia. Down to 12 since change from cipro to vanco and meropenem. Poor historian but possible sources of her persistent elevated wbc include gallbladder, or leg wounds.  She is PCN allergic.  10/24- US shows increasing GB wall thickness and a stone in neck.  I think she likely has cholecystitis.  Culture from leg wound pending. Seen by wound care.  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  10/30 - wbc nml, no fevers, lfts nml  Recommendations Continue off of abx and monitor wbc and fever cureve Continue wound care per wound care consult recs. Thank you very much for the consult.  I will sign off but please call with questions.   Leonel Ramsay   02/03/2017, 3:16 PM

## 2017-02-03 NOTE — Progress Notes (Signed)
Nutrition Follow-up  DOCUMENTATION CODES:   Not applicable  INTERVENTION:   Ensure Enlive po TID, each supplement provides 350 kcal and 20 grams of protein  MVI liquid daily   Add Magic cup TID with meals, each supplement provides 290 kcal and 9 grams of protein  Recommend vitamin C 500mg  daily for wound healing   Recommend thiamine 100mg  oral daily   Recommend vitamin D 1500 units oral daily   Recommend decrease vitamin B-12 to 500 mcg oral daily   Recommend 16 mg elemental zinc PO for 6 months (200% RDI)  Recommend copper 1 mg PO for every 8-15mg  elemental zinc daily until zinc supplementation is complete  Recommend check zinc and copper levels  NUTRITION DIAGNOSIS:   Malnutrition (Moderate) related to chronic illness (hx Roux-en-Y gastric bypass 2012, anorexia following right BKA, EtOH abuse) as evidenced by moderate depletion of body fat, moderate depletions of muscle mass, moderate to severe fluid accumulation. 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 Intubated on 10/10. Extubated 01/22/2017  Pt's appetite is improving; pt eating 25% of meals today and drank two Ensure. Per chart, pt with 8lb weight gain since admit r/t severe anasarca. Pt will need to drink 3 Ensure per day and eat at least 50% of her meals  to meet her estimated needs. Pt will require adequate protein for wound healing. If pt unable to meet her estimated needs via oral intake recommend PEG tube placement if able (pt s/p Roux-en-Y 2012). Pt will not be able to go to SNF with NGT. RD managing replacement of micronutrients. Recommend micronutrients as listed above. Zinc and copper levels pending.    Medications reviewed and include: Vit D, ciprofloxacin, copper gluconate, lovenox, folic acid, lasix, metronidazole, MVI, KCl, thiamine, vit B-12, zinc, tramadol  Labs reviewed: creat 0.3(L), Ca 7.6(L) adj. 9.04 wnl, alb 2.2(L), AST 70(H), tbili 1.5(H) Iron 62 wnl, ferritin 404(H), folate 27(H)- 10/28 B-12- 4059(H)- 10/28 Hgb 9.5(L), Hct 28.9(L)  Diet Order:  DIET SOFT Room service appropriate? Yes; Fluid consistency: Thin  EDUCATION NEEDS:   Education needs addressed  Skin:  Wound (see comment) (Stg II buttocks, unstageable sacrum, deep tissue wound heel, weeping to arms and legs, cellulitis lower legs, blisters arms and legs  Last BM:  10/29- type 7- rectal tube   Height:   Ht Readings from Last 1 Encounters:  01/14/17 5\' 6"  (1.676 m)    Weight:   Wt Readings from Last 1 Encounters:  02/03/17 164 lb (74.4 kg)    Ideal Body Weight:  59.1 kg  BMI:  Body mass index is 26.47 kg/m.  Estimated Nutritional Needs:   Kcal:  1475-1700 (MSJ x 1.3-1.5)  Protein:  85-100 grams (1.4-1.6 grams/kg)  Fluid:  1.5-1.8 L/day (25-30 ml/kg)  Koleen Distance MS, RD, LDN Pager #- [phone] After Hours Pager: [phone]

## 2017-02-03 NOTE — Clinical Social Work Note (Signed)
[facility] denied patient thus, patient will now require SNF placement. CSW has initiated a bed search and will complete assessment with patient's husband tomorrow. RN CM already informed patient's husband about LTAC denial and begin of SNF bed search. Shela Leff MSW,LCSW [phone]

## 2017-02-03 NOTE — Care Management (Signed)
Notification from [place] [place] representative that admission to [facility] has been denied. Husband and patient notified of decision.  Husband states that he would like to pursue SNF.  CSW notified.  Patient's husband provided information to medicare.gov to review facility ratings at his request.

## 2017-02-03 NOTE — NC FL2 (Signed)
[place] LEVEL OF CARE SCREENING TOOL     IDENTIFICATION  Patient Name: Sonya Park Birthdate: 23-Nov-1940 Sex: female Admission Date (Current Location): 01/14/2017  [place] and [place] Number:  Engineering geologist and Address:  [facility], [address] [place], [place] [address]      Provider Number: [id number]  Attending Physician Name and Address:  Vaughan Basta, *  Relative Name and Phone Number:  Jeanee Fabre (spouse) [phone]    Current Level of Care: Hospital Recommended Level of Care: [place] Prior Approval Number:    Date Approved/Denied:   PASRR Number:    Discharge Plan: SNF    Current Diagnoses: Patient Active Problem List   Diagnosis Date Noted  . LFT elevation   . Severe malnutrition ([place])   . Adult failure to thrive   . Hyperbilirubinemia   . 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 Thrombocytopenia ([place])   . Anasarca   . Pressure injury of skin 01/19/2017  . Acute respiratory failure ([place])   . Bacteremia   . Palliative care by specialist   . Goals of care, counseling/discussion   . Alcohol abuse 01/15/2017  . Sepsis ([place]) 01/14/2017  . Facial trauma 01/14/2017  . Osteoporosis 01/14/2017  . Neutropenia ([place]) 01/14/2017  . 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 Special screening for malignant neoplasms, colon   . Benign neoplasm of transverse colon   . Fracture of right inferior pubic ramus ([place]) 09/17/2014  . Compression fracture of L3 lumbar vertebra ([facility]) 09/17/2014    Orientation RESPIRATION BLADDER Height & Weight     Self  Normal Continent Weight: 164 lb (74.4 kg) Height:  _0  (167.6 cm)  BEHAVIORAL SYMPTOMS/MOOD NEUROLOGICAL BOWEL NUTRITION STATUS   (none)  (none) Incontinent Diet (soft)  AMBULATORY STATUS COMMUNICATION OF NEEDS Skin   Total Care Verbally PU Stage and Appropriate Care                       Personal Care Assistance Level of Assistance   Total care Bathing Assistance: Maximum assistance Feeding assistance: Maximum assistance Dressing Assistance: Maximum assistance Total Care Assistance: Maximum assistance   Functional Limitations Info   (no issues)          SPECIAL CARE FACTORS FREQUENCY  PT (By licensed PT)     PT Frequency: Up to 5X per day, 5 days per week              Contractures Contractures Info: Not present    Additional Factors Info  Code Status Code Status Info: full Allergies Info: pcn's           Current Medications (02/03/2017):  This is the current hospital active medication list Current Facility-Administered Medications  Medication Dose Route Frequency Provider Last Rate Last Dose  . bacitracin ointment 1 application  1 application Topical BID Lance Coon, MD   1 application at 05/39/76 1022  . chlorhexidine gluconate (MEDLINE KIT) (PERIDEX) 0.12 % solution 15 mL  15 mL Mouth Rinse BID Wilhelmina Mcardle, MD   15 mL at 02/03/17 0800  . cholecalciferol (VITAMIN D) tablet 2,000 Units  2,000 Units Oral Daily Dustin Flock, MD   2,000 Units at 02/03/17 1021  . ciprofloxacin (CIPRO) tablet 500 mg  500 mg Oral BID Vaughan Basta, MD   500 mg at 02/03/17 0751  . collagenase (SANTYL) ointment   Topical Daily Vaughan Basta, MD      .  Copper Gluconate TABS 1 tablet  1 tablet Oral Daily Loletha Grayer, MD   1 tablet at 02/03/17 1402  . enoxaparin (LOVENOX) injection 40 mg  40 mg Subcutaneous Q24H Wilhelmina Mcardle, MD   40 mg at 02/02/17 1928  . feeding supplement (ENSURE ENLIVE) (ENSURE ENLIVE) liquid 237 mL  237 mL Oral BID BM Dustin Flock, MD   237 mL at 02/03/17 1400  . folic acid (FOLVITE) tablet 1 mg  1 mg Oral Daily [name], MD   1 mg at 02/03/17 1021  . furosemide (LASIX) injection 20 mg  20 mg Intravenous Q12H Dustin Flock, MD   20 mg at 02/03/17 0502  . ipratropium-albuterol (DUONEB) 0.5-2.5 (3) MG/3ML nebulizer solution 3 mL  3 mL Nebulization TID Dustin Flock, MD   3 mL at 02/03/17 1337  . metroNIDAZOLE (FLAGYL) tablet 500 mg  500 mg Oral Q12H Vaughan Basta, MD   500 mg at 02/03/17 1021  . modafinil (PROVIGIL) tablet 100 mg  100 mg Oral Daily Dustin Flock, MD   100 mg at 02/03/17 1021  . multivitamin liquid 15 mL  15 mL Oral Daily [name], MD   15 mL at 02/03/17 1026  . ondansetron (ZOFRAN) injection 4 mg  4 mg Intravenous Q6H PRN Lance Coon, MD      . potassium chloride 20 MEQ/15ML (10%) solution 40 mEq  40 mEq Oral Daily Dustin Flock, MD   40 mEq at 02/03/17 1023  . rifaximin (XIFAXAN) tablet 550 mg  550 mg Oral BID [name], MD   550 mg at 02/03/17 1022  . sodium chloride flush (NS) 0.9 % injection 10-40 mL  10-40 mL Intracatheter Q12H Dustin Flock, MD   10 mL at 02/03/17 1026  . sodium chloride flush (NS) 0.9 % injection 10-40 mL  10-40 mL Intracatheter PRN Dustin Flock, MD   10 mL at 01/28/17 2239  . thiamine (VITAMIN B-1) tablet 100 mg  100 mg Oral Daily [name], MD   100 mg at 02/03/17 1021  . traMADol (ULTRAM) tablet 50 mg  50 mg Oral Q6H PRN [name], MD   50 mg at 02/03/17 0459  . vitamin B-12 (CYANOCOBALAMIN) tablet 1,000 mcg  1,000 mcg Oral Daily [name], MD   1,000 mcg at 02/03/17 1021  . zinc sulfate capsule 220 mg  220 mg Oral Daily [name], MD   220 mg at 02/03/17 1021     Discharge Medications: Please see discharge summary for a list of discharge medications.  Relevant Imaging Results:  Relevant Lab Results:   Additional Information ss: [id number]  Shela Leff, LCSW

## 2017-02-03 NOTE — Progress Notes (Signed)
Patient ID: Sonya Park, female   DOB: [birth year], 76 y.o.   MRN: [id number]   [facility] PROGRESS NOTE  Sonya Park DOB: 07/06/1940 DOA: 01/14/2017 PCP: Cletis Athens, MD  HPI/Subjective: able to speak and oriented. Patient still has diarrhea and has a rectal tube in place and has Foley in place Patient had NG tube taken out, she did eat breakfast this morning Patient unable to do much with physical therapy  Still have significant swelling on her skin.  Objective: Vitals:   02/03/17 1154 02/03/17 1434  BP: 104/65   Pulse: (!) 101 (!) 110  Resp:    Temp: 98.2 F (36.8 C)   SpO2: 96% 91%    Filed Weights   02/01/17 0419 02/02/17 0502 02/03/17 0439  Weight: 71.2 kg (157 lb) 61.7 kg (136 lb) 74.4 kg (164 lb)    ROS: Review of Systems  Constitutional: Positive for malaise/fatigue. Negative for chills and fever.  HENT: Negative for congestion, ear pain and sore throat.   Respiratory: Negative for hemoptysis and shortness of breath.   Cardiovascular: Negative for chest pain, orthopnea and leg swelling.  Gastrointestinal: Negative for abdominal pain, blood in stool, diarrhea and nausea.  Genitourinary: Negative for frequency and urgency.  Musculoskeletal: Negative for joint pain and myalgias.  Skin: Negative for rash.  Neurological: Positive for weakness. Negative for tingling, tremors and focal weakness.  Psychiatric/Behavioral: The patient is not nervous/anxious.    Exam: Physical Exam  HENT:  Nose: No mucosal edema.  Mouth/Throat: No oropharyngeal exudate.  Eyes: Pupils are equal, round, and reactive to light. Lids are normal.  Icteric.   Neck: Carotid bruit is not present. No thyromegaly present.  Cardiovascular: Regular rhythm, S1 normal and S2 normal.   Respiratory: She has decreased breath sounds in the right lower field and the left lower field. She has no wheezes. She has no rhonchi. She has no rales.  GI: Soft. Bowel sounds are normal.  There is no tenderness.  Musculoskeletal:       Right elbow: She exhibits no swelling.       Left elbow: She exhibits no swelling.       Right wrist: She exhibits no swelling.       Left wrist: She exhibits no swelling.       Right knee: She exhibits no swelling.       Left knee: She exhibits no swelling.       Right ankle: She exhibits no swelling.  Lymphadenopathy:    She has no cervical adenopathy.  Neurological: She is alert.  Patient barely able to straight leg raise. Patient able to lift arms up off the bed  Skin:  Dressing in place  Psychiatric: She has a normal mood and affect.      Data Reviewed: Basic Metabolic Panel:  Recent Labs Lab 01/29/17 1339 01/30/17 0602 01/31/17 1225 02/01/17 0535 02/02/17 0345  NA 145 143 140 141 140  K 3.2* 3.4* 3.8 3.4* 3.6  CL 113* 109 105 109 107  CO2 _0 GLUCOSE 77 109* 93 80 86  BUN 26* 29* 23* 21* 20  CREATININE 0.37* 0.34* <0.30* <0.30* 0.30*  CALCIUM 6.8* 7.4* 7.5* 7.4* 7.6*   Liver Function Tests:  Recent Labs Lab 01/29/17 1339 02/01/17 0535 02/02/17 0345  AST 126* 85* 70*  ALT 92* 62* 52  ALKPHOS 167* 131* 98  BILITOT 2.2* 2.1* 1.5*  PROT 3.9* 4.2* 4.0*  ALBUMIN 1.5* 2.1* 2.2*  No results for input(s): AMMONIA in the last 168 hours. CBC:  Recent Labs Lab 01/30/17 0602 01/31/17 1225 02/01/17 0535 02/02/17 0345 02/03/17 0544  WBC 11.2* 7.2 6.9 6.0 5.7  NEUTROABS  --  5.9  --   --   --   HGB 8.9* 7.4* 8.5* 8.0* 9.5*  HCT 28.5* 22.8* 26.3* 24.4* 28.9*  MCV 116.5* 114.7* 114.7* 114.1* 112.8*  PLT 183 131* 131* 113* 111*    CBG: No results for input(s): GLUCAP in the last 168 hours.  Recent Results (from the past 240 hour(s))  CULTURE, BLOOD (ROUTINE X 2) w Reflex to ID Panel     Status: None   Collection Time: 01/26/17  4:08 PM  Result Value Ref Range Status   Specimen Description BLOOD BLOOD LEFT WRIST  Final   Special Requests   Final    BOTTLES DRAWN AEROBIC AND ANAEROBIC Blood  Culture adequate volume   Culture NO GROWTH 5 DAYS  Final   Report Status 01/31/2017 FINAL  Final  CULTURE, BLOOD (ROUTINE X 2) w Reflex to ID Panel     Status: None   Collection Time: 01/26/17  4:08 PM  Result Value Ref Range Status   Specimen Description BLOOD BLOOD RIGHT WRIST  Final   Special Requests   Final    BOTTLES DRAWN AEROBIC AND ANAEROBIC Blood Culture results may not be optimal due to an inadequate volume of blood received in culture bottles   Culture NO GROWTH 5 DAYS  Final   Report Status 01/31/2017 FINAL  Final  Aerobic Culture (superficial specimen)     Status: None   Collection Time: 01/28/17  3:28 PM  Result Value Ref Range Status   Specimen Description WOUND  Final   Special Requests Normal  Final   Gram Stain   Final    MODERATE WBC PRESENT, PREDOMINANTLY PMN NO ORGANISMS SEEN    Culture   Final    NO GROWTH 3 DAYS Performed at [facility], [address] [place], [place] [address]    Report Status 02/01/2017 FINAL  Final  C difficile quick screen w PCR reflex     Status: None   Collection Time: 02/02/17 12:32 PM  Result Value Ref Range Status   C Diff antigen NEGATIVE NEGATIVE Final   C Diff toxin NEGATIVE NEGATIVE Final   C Diff interpretation No C. difficile detected.  Final     Studies: No results found.  Scheduled Meds: . bacitracin  1 application Topical BID  . chlorhexidine gluconate (MEDLINE KIT)  15 mL Mouth Rinse BID  . cholecalciferol  2,000 Units Oral Daily  . ciprofloxacin  500 mg Oral BID  . collagenase   Topical Daily  . Copper Gluconate  1 tablet Oral Daily  . enoxaparin (LOVENOX) injection  40 mg Subcutaneous Q24H  . feeding supplement (ENSURE ENLIVE)  237 mL Oral BID BM  . folic acid  1 mg Oral Daily  . furosemide  20 mg Intravenous Q12H  . ipratropium-albuterol  3 mL Nebulization TID  . metroNIDAZOLE  500 mg Oral Q12H  . modafinil  100 mg Oral Daily  . multivitamin  15 mL Oral Daily  . potassium chloride  40 mEq Oral  Daily  . rifaximin  550 mg Oral BID  . sodium chloride flush  10-40 mL Intracatheter Q12H  . thiamine  100 mg Oral Daily  . vitamin B-12  1,000 mcg Oral Daily  . zinc sulfate  220 mg Oral Daily  Continuous Infusions:   Assessment/Plan: Patient is a 76 year old white female with alcoholic liver disease    1. Septic shock with Escherichia coli bacteremia.  status post treatment with Cipro Started on meropenem due to persistently elevated WBC count,  discontinue vancomycin since her blood cultures have been negative, appreciate ID input   As improving- changed again to cipro and flagyl and suggest stop on 02/03/17  2. Alcoholic liver disease with acute liver failure: patient's liver function are stable, however clinically patient has evidence of ascites on her ultrasound, severe hypoalbuminemia with third spacing-  given her albumin multiple doses ,        Improving slowly. 3. Acute respiratory failure with hypoxia. Patient extubated 01/22/2017. Currently on room air 4. Dysphasia with's sore throat and upper GI series shows no evidence of obstruction : patient's NG tube has been removed, and her oral intake is improving daily 5. Acute encephalopathy secondary to severe sepsis.  continue Xifaxan mental with some improvement continue Xifaxan and Provigil       Improved. 6. Pancytopenia could be secondary to alcohol abuse and/or sepsis. Platelet count recovered 7. Hypomagnesemia, hypocalcemia and hypokalemia. replaced 8. Severe malnutrition, Anasarca and third spacing of fluids.  Patient has been receiving IV albumin since yesterday 9. Jaundice likely secondary to shock. Could also be secondary to prior alcohol abuse.jaundice improving   10. Facial trauma at home. Fractured nasal bones. Hematoma and bruising around the eyelids. 11. Anasarca continue low dose lasix 12. Prognosis very poor: patient still clinically ill, she needs aggressive management and would benefit from [place]  working on approval.  Her PICC line is not working , removed, try removing rectal tube and check progression today, may qualify for rehab,  Code Status:     Code Status Orders        Start     Ordered   01/15/17 0158  Full code  Continuous     01/15/17 0157    Code Status History    Date Active Date Inactive Code Status Order ID Comments User Context   12/13/2015 11:49 AM 12/15/2015  7:28 PM Full Code [id number]  Dereck Leep, MD Inpatient   09/17/2014  6:01 AM 09/18/2014  3:51 PM Full Code [id number]  Juluis Mire, MD Inpatient     Family Communication: spoke with husband at the bedside Disposition Plan: potential candidate for LTAC   Consultants:  Critical care specialist  Antibiotics:  Cipro, Flagyl  Time spent: 32 minutes  [place], [place]

## 2017-02-03 NOTE — Plan of Care (Signed)
Problem: Physical Regulation: Goal: Will remain free from infection Outcome: Progressing Pt receiving oral cipro

## 2017-02-03 NOTE — Progress Notes (Signed)
Physical Therapy Treatment Patient Details Name: Sonya Park MRN: [id number] DOB: March 05, 1941 Today's Date: 02/03/2017    History of Present Illness 76 y/o female who suffered a fall with considerable facial fractures and ended up being intubated 10/10-10/17 in CCU.  h/o ETOH abuse, e. coli (+)    PT Comments    Pt agreeable to PT; denies pain. Pt agreeable to sitting edge of bed. Requires +2 assist, but pt able to demonstrate participation with bridging and mobilizing lower extremities toward edge of bed. Requires Max A for trunk, but does give good effort; fatigues quickly. Poor trunk control, but pt able to assist some with upper extremities in sitting posture with pillows under bilateral upper extremities. Tolerates sitting approximately 10 minutes. Participates in several exercises once supine, but fatigues requiring rest. Pt motivated; assess frequency next session for possible increase to 7x/wk to progress strength, endurance and encourage up in bed activity to improve functional mobility.    Follow Up Recommendations  LTACH (versus SNF)     Equipment Recommendations  Rolling walker with 5" wheels    Recommendations for Other Services       Precautions / Restrictions Precautions Precautions: Fall Restrictions Weight Bearing Restrictions: No    Mobility  Bed Mobility Overal bed mobility: Needs Assistance Bed Mobility: Supine to Sit;Sit to Supine     Supine to sit: Max assist;+2 for physical assistance Sit to supine: Max assist;+2 for physical assistance   General bed mobility comments: Pt able to assist with bridging toward edge of bed; Pt also able to assist with mobilitzing legs towards edge of bed with assist. Gives good effort to attempt elevating trunk, but does require Max A  Transfers                 General transfer comment: Not tested, as pt has poor sitting posture and unable to maintain LEs in 90/90 position  Ambulation/Gait                  Stairs            Wheelchair Mobility    Modified Rankin (Stroke Patients Only)       Balance Overall balance assessment: Needs assistance Sitting-balance support: Bilateral upper extremity supported;Feet supported Sitting balance-Leahy Scale: Poor Sitting balance - Comments: Requires placement of LEs in 90/90 position and unable to maintain for very long. Requires Mod decreasing to Mas A for trunk with increased length of time. Pt sits approximately 10 minutes Postural control: Posterior lean                                  Cognition Arousal/Alertness: Awake/alert Behavior During Therapy: WFL for tasks assessed/performed Overall Cognitive Status: Within Functional Limits for tasks assessed                                        Exercises General Exercises - Lower Extremity Ankle Circles/Pumps: AROM;Both;10 reps;Supine Quad Sets: Strengthening;Both;10 reps;Supine Gluteal Sets: Strengthening;Both;10 reps;Supine    General Comments        Pertinent Vitals/Pain Pain Assessment: No/denies pain    Home Living                      Prior Function            PT Goals (current goals  can now be found in the care plan section) Progress towards PT goals: Progressing toward goals    Frequency    Min 2X/week      PT Plan Current plan remains appropriate    Co-evaluation              AM-PAC PT "6 Clicks" Daily Activity  Outcome Measure  Difficulty turning over in bed (including adjusting bedclothes, sheets and blankets)?: Unable Difficulty moving from lying on back to sitting on the side of the bed? : Unable Difficulty sitting down on and standing up from a chair with arms (e.g., wheelchair, bedside commode, etc,.)?: Unable Help needed moving to and from a bed to chair (including a wheelchair)?: Total Help needed walking in hospital room?: Total Help needed climbing 3-5 steps with a railing? : Total 6 Click  Score: 6    End of Session Equipment Utilized During Treatment: Oxygen   Patient left: in bed;with call bell/phone within reach;with bed alarm set;with family/visitor present;Other (comment) (boots in place)   PT Visit Diagnosis: Muscle weakness (generalized) (M62.81)     Time: 8588-5027 PT Time Calculation (min) (ACUTE ONLY): 27 min  Charges:  $Therapeutic Exercise: 8-22 mins $Therapeutic Activity: 8-22 mins                    G CodesLarae Grooms, PTA 02/03/2017, 3:35 PM

## 2017-02-04 DIAGNOSIS — R1310 Dysphagia, unspecified: Secondary | ICD-10-CM

## 2017-02-04 DIAGNOSIS — Z7189 Other specified counseling: Secondary | ICD-10-CM

## 2017-02-04 DIAGNOSIS — L299 Pruritus, unspecified: Secondary | ICD-10-CM

## 2017-02-04 DIAGNOSIS — Z1211 Encounter for screening for malignant neoplasm of colon: Secondary | ICD-10-CM

## 2017-02-04 LAB — COMPREHENSIVE METABOLIC PANEL
ALT: 42 U/L (ref 14–54)
AST: 59 U/L — ABNORMAL HIGH (ref 15–41)
Albumin: 2.2 g/dL — ABNORMAL LOW (ref 3.5–5.0)
Alkaline Phosphatase: 109 U/L (ref 38–126)
Anion gap: 6 (ref 5–15)
BUN: 19 mg/dL (ref 6–20)
CHLORIDE: 106 mmol/L (ref 101–111)
CO2: 26 mmol/L (ref 22–32)
CREATININE: 0.36 mg/dL — AB (ref 0.44–1.00)
Calcium: 7.9 mg/dL — ABNORMAL LOW (ref 8.9–10.3)
Glucose, Bld: 91 mg/dL (ref 65–99)
POTASSIUM: 3.2 mmol/L — AB (ref 3.5–5.1)
SODIUM: 138 mmol/L (ref 135–145)
Total Bilirubin: 1.7 mg/dL — ABNORMAL HIGH (ref 0.3–1.2)
Total Protein: 4.4 g/dL — ABNORMAL LOW (ref 6.5–8.1)

## 2017-02-04 LAB — CBC
HCT: 27.3 % — ABNORMAL LOW (ref 35.0–47.0)
Hemoglobin: 8.7 g/dL — ABNORMAL LOW (ref 12.0–16.0)
MCH: 35.8 pg — AB (ref 26.0–34.0)
MCHC: 31.8 g/dL — ABNORMAL LOW (ref 32.0–36.0)
MCV: 112.6 fL — ABNORMAL HIGH (ref 80.0–100.0)
PLATELETS: 111 10*3/uL — AB (ref 150–440)
RBC: 2.42 MIL/uL — AB (ref 3.80–5.20)
RDW: 16.9 % — AB (ref 11.5–14.5)
WBC: 6.3 10*3/uL (ref 3.6–11.0)

## 2017-02-04 LAB — MAGNESIUM: MAGNESIUM: 1.7 mg/dL (ref 1.7–2.4)

## 2017-02-04 MED ORDER — DIPHENHYDRAMINE-ZINC ACETATE 2-0.1 % EX CREA
TOPICAL_CREAM | Freq: Three times a day (TID) | CUTANEOUS | Status: DC | PRN
  Filled 2017-02-04: qty 28

## 2017-02-04 MED ORDER — ALBUMIN HUMAN 25 % IV SOLN
12.5000 g | Freq: Once | INTRAVENOUS | Status: AC
  Administered 2017-02-04: 12.5 g via INTRAVENOUS
  Filled 2017-02-04: qty 50

## 2017-02-04 MED ORDER — LOPERAMIDE HCL 2 MG PO CAPS
2.0000 mg | ORAL_CAPSULE | Freq: Four times a day (QID) | ORAL | Status: DC
  Administered 2017-02-04 – 2017-02-11 (×28): 2 mg via ORAL
  Filled 2017-02-04 (×28): qty 1

## 2017-02-04 MED ORDER — POTASSIUM CHLORIDE 20 MEQ/15ML (10%) PO SOLN
40.0000 meq | Freq: Two times a day (BID) | ORAL | Status: DC
  Administered 2017-02-04 – 2017-02-11 (×15): 40 meq via ORAL
  Filled 2017-02-04 (×16): qty 30

## 2017-02-04 MED ORDER — MAGNESIUM SULFATE 2 GM/50ML IV SOLN
2.0000 g | Freq: Once | INTRAVENOUS | Status: AC
  Administered 2017-02-04: 2 g via INTRAVENOUS
  Filled 2017-02-04: qty 50

## 2017-02-04 MED ORDER — IPRATROPIUM-ALBUTEROL 0.5-2.5 (3) MG/3ML IN SOLN: 3.0000 mL | Freq: Four times a day (QID) | RESPIRATORY_TRACT | Status: DC | PRN

## 2017-02-04 MED ORDER — FERROUS SULFATE 325 (65 FE) MG PO TABS
325.0000 mg | ORAL_TABLET | Freq: Two times a day (BID) | ORAL | Status: DC
  Administered 2017-02-04 – 2017-02-11 (×15): 325 mg via ORAL
  Filled 2017-02-04 (×14): qty 1

## 2017-02-04 MED FILL — Medication: Qty: 1 | Status: AC

## 2017-02-04 NOTE — Progress Notes (Signed)
Physical Therapy Treatment Patient Details Name: MEERAB MASELLI MRN: [id number] DOB: [birth year] Today's Date: 02/04/2017    History of Present Illness 76 y/o female who suffered a fall with considerable facial fractures and ended up being intubated 10/10-10/17 in CCU.  h/o ETOH abuse, e. coli (+)    PT Comments    Pt lethargic, was sleeping, but easily awoken. Agreeable to PT for supine exercises. Pt notes bottom is sore from rectal tube, and does not wish to sit. Participates in supine exercises with assist as needed. Demonstates good initiation of movement. Fatigues easily, but able to complete sets of 10-12 with brief rest periods after 7-8 repetitions. Continue PT to progress strength, endurance to improve functional mobility and decrease assist required.    Follow Up Recommendations  LTACH     Equipment Recommendations  Rolling walker with 5" wheels    Recommendations for Other Services       Precautions / Restrictions Precautions Precautions: Fall Restrictions Weight Bearing Restrictions: No    Mobility  Bed Mobility                  Transfers                    Ambulation/Gait                 Stairs            Wheelchair Mobility    Modified Rankin (Stroke Patients Only)       Balance                                            Cognition Arousal/Alertness: Lethargic Behavior During Therapy: WFL for tasks assessed/performed Overall Cognitive Status: Within Functional Limits for tasks assessed                                        Exercises General Exercises - Lower Extremity Ankle Circles/Pumps: AROM;Both;20 reps;Supine Quad Sets: Strengthening;Both;Supine;10 reps Short Arc Quad: Strengthening;Both;Supine;10 reps Heel Slides: AAROM;Both;Supine;Other (comment) (12 reps) Hip ABduction/ADduction: AAROM;Both;Supine;Other (comment) (12 reps)    General Comments        Pertinent  Vitals/Pain Pain Assessment:  ("My bottom" does not quantify)    Home Living                      Prior Function            PT Goals (current goals can now be found in the care plan section) Progress towards PT goals: Progressing toward goals (slowly)    Frequency    Min 2X/week      PT Plan Current plan remains appropriate    Co-evaluation              AM-PAC PT "6 Clicks" Daily Activity  Outcome Measure  Difficulty turning over in bed (including adjusting bedclothes, sheets and blankets)?: Unable Difficulty moving from lying on back to sitting on the side of the bed? : Unable Difficulty sitting down on and standing up from a chair with arms (e.g., wheelchair, bedside commode, etc,.)?: Unable Help needed moving to and from a bed to chair (including a wheelchair)?: Total Help needed walking in hospital room?: Total Help needed climbing 3-5 steps with a railing? : Total  6 Click Score: 6    End of Session     Patient left: in bed;with call bell/phone within reach;with bed alarm set;with family/visitor present;Other (comment)   PT Visit Diagnosis: Muscle weakness (generalized) (M62.81)     Time: 2174-7159 PT Time Calculation (min) (ACUTE ONLY): 19 min  Charges:  $Therapeutic Exercise: 8-22 mins                    G Codes:        Larae Grooms, PTA 02/04/2017, 4:40 PM

## 2017-02-04 NOTE — Progress Notes (Signed)
Daily Progress Note   Patient Name: Sonya Park       Date: 02/04/2017 DOB: 08-23-1940  Age: 76 y.o. MRN#: [id number] Attending Physician: Vaughan Basta, * Primary Care Physician: Cletis Athens, MD Admit Date: 01/14/2017  Reason for Consultation/Follow-up: Establishing goals of care  Subjective: Met at bedside with spouse and patient for continued GOC. Spouse had several questions regarding rehab facilities, SNF and SW role.  Attempted to discuss patient's overall trajectory, comorbidities, disease state, prognosis and GOC. Patient states she "I came here to get better". Discussed hope that she continues to get better, however, concern that she will decline due to severe deconditioning. Patient declined to elicit her desires in the event she were unable to make decisions on her own. She also declined code status discussion. Patient and spouse agreed to outpatient follow-up for continued GOC at [facility].  Review of Systems  Constitutional: Positive for malaise/fatigue and weight loss.  Skin: Positive for itching.  Neurological: Positive for weakness.    Length of Stay: 21  Current Medications: Scheduled Meds:  . bacitracin  1 application Topical BID  . chlorhexidine gluconate (MEDLINE KIT)  15 mL Mouth Rinse BID  . cholecalciferol  2,000 Units Oral Daily  . collagenase   Topical Daily  . Copper Gluconate  1 tablet Oral Daily  . enoxaparin (LOVENOX) injection  40 mg Subcutaneous Q24H  . feeding supplement (ENSURE ENLIVE)  237 mL Oral TID BM  . ferrous sulfate  325 mg Oral BID WC  . folic acid  1 mg Oral Daily  . furosemide  20 mg Intravenous Q12H  . ipratropium-albuterol  3 mL Nebulization TID  . loperamide  2 mg Oral Q6H  . modafinil  100 mg Oral Daily  . multivitamin  15  mL Oral Daily  . potassium chloride  40 mEq Oral BID  . rifaximin  550 mg Oral BID  . sodium chloride flush  10-40 mL Intracatheter Q12H  . thiamine  100 mg Oral Daily  . vitamin B-12  1,000 mcg Oral Daily  . vitamin C  500 mg Oral Daily  . zinc sulfate  220 mg Oral Daily    Continuous Infusions:   PRN Meds: [DISCONTINUED] ondansetron **OR** ondansetron (ZOFRAN) IV, sodium chloride flush, traMADol  Physical Exam  Constitutional:  Frail appearing  Abdominal: She  exhibits distension.  Ascites noted  Musculoskeletal: She exhibits edema.  weak  Neurological: She is alert.  Skin:  jaundice  Nursing note and vitals reviewed.           Vital Signs: BP 111/63 (BP Location: Left Arm)   Pulse 97   Temp 98 F (36.7 C) (Oral)   Resp 17   Ht 5' 6" (1.676 m)   Wt 74.8 kg (165 lb)   SpO2 97%   BMI 26.63 kg/m  SpO2: SpO2: 97 % O2 Device: O2 Device: Not Delivered O2 Flow Rate: O2 Flow Rate (L/min): 3 L/min  Intake/output summary:  Intake/Output Summary (Last 24 hours) at 02/04/17 1135 Last data filed at 02/04/17 1015  Gross per 24 hour  Intake              240 ml  Output              950 ml  Net             -710 ml   LBM: Last BM Date: 02/03/17 Baseline Weight: Weight: 63 kg (139 lb) Most recent weight: Weight: 74.8 kg (165 lb)       Palliative Assessment/Data: PPS: 20%   Flowsheet Rows     Most Recent Value  Intake Tab  Referral Department  Hospitalist  Unit at Time of Referral  Med/Surg Unit  Palliative Care Primary Diagnosis  Sepsis/Infectious Disease  Palliative Care Type  Return patient Palliative Care  Reason for referral  Clarify Goals of Care  Date first seen by Palliative Care  01/17/17  Clinical Assessment  Palliative Performance Scale Score  30%  Psychosocial & Spiritual Assessment  Palliative Care Outcomes  Patient/Family meeting held?  Yes  Who was at the meeting?  patient and husband  Palliative Care Outcomes  Clarified goals of care, Counseled  regarding hospice, Provided end of life care assistance, Provided psychosocial or spiritual support, ACP counseling assistance      Patient Active Problem List   Diagnosis Date Noted  . LFT elevation   . Severe malnutrition ([place])   . Adult failure to thrive   . Hyperbilirubinemia   . 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 Thrombocytopenia ([place])   . Anasarca   . Pressure injury of skin 01/19/2017  . Acute respiratory failure ([place])   . Bacteremia   . Palliative care by specialist   . Goals of care, counseling/discussion   . Alcohol abuse 01/15/2017  . Sepsis ([place]) 01/14/2017  . Facial trauma 01/14/2017  . Osteoporosis 01/14/2017  . Neutropenia ([place]) 01/14/2017  . 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 Special screening for malignant neoplasms, colon   . Benign neoplasm of transverse colon   . Fracture of right inferior pubic ramus ([place]) 09/17/2014  . Compression fracture of L3 lumbar vertebra ([facility]) 09/17/2014    Palliative Care Assessment & Plan   Patient Profile: 76 y.o.femalewith past medical history of osteoporosis, arthritis, insomnia, back pain, bilateral knee replacements, gastric bypass, and ETOH abuse admitted on 10/9/2018after fall on 01/13/17. She went to PCP on 10/9 and CT maxillofacial revealed bilateral bone fractures without significant displacement, left frontal hemorrhage suggestive of occult fracture, and extensive bilateral periorbital soft swelling and hematoma with inflammatory changes over bilateral maxilla. In ED, lab results revealed WBC 1.9 and lactic acid of 6.7. Patient also hypotensive. Admitted to ICU for sepsis. On 10/10, patient intubated secondary to severe metabolic acidosis and encephalopathy. Husband pursuing FULL scope treatment including trach/peg if necessary.  Palliative re-consulted on 10/25 for failure to thrive. 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 Patient is not  a candidate for surgical interventions. Also not a candidate for PEG tube placement secondary to history of gastric bypass. 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   Assessment/Recommendations/Plan   Cont full scope care  Palliative f/u at SNF  Benadryl topical for areas of itching- likely r/t cholangistatic pruritis  Goals of Care and Additional Recommendations:  Limitations on Scope of Treatment: Full Scope Treatment  Code Status:  Full code  Prognosis:   Unable to determine  Discharge Planning:  [facility] for rehab with Palliative care service follow-up  Care plan was discussed with patient and spouse.  Thank you for allowing the Palliative Medicine Team to assist in the care of this patient.   Time In: 1050 Time Out: 1200 Total Time 70 mins Prolonged Time Billed Yes      Greater than 50%  of this time was spent counseling and coordinating care related to the above assessment and plan.  Mariana Kaufman, AGNP-C Palliative Medicine   Please contact Palliative Medicine Team phone at [phone] for questions and concerns.

## 2017-02-04 NOTE — Clinical Social Work Note (Signed)
Now that patient has rectal tube again and intravenous albumin, patient is not appropriate for SNF placement at this time. RN CM is trying again to see if LTAC can be approved. Shela Leff MSW,LCSW [phone]

## 2017-02-04 NOTE — Care Management (Signed)
Erika from [facility] notified that rectal tube has been replaced, and dose of Albumin was given 10/30.  MD note pending.  Per MD anticipates that rectal tube will be removed tomorrow, and albumin levels will be rechecked

## 2017-02-05 DIAGNOSIS — Z79899 Other long term (current) drug therapy: Secondary | ICD-10-CM

## 2017-02-05 DIAGNOSIS — Z9884 Bariatric surgery status: Secondary | ICD-10-CM

## 2017-02-05 DIAGNOSIS — Z9181 History of falling: Secondary | ICD-10-CM

## 2017-02-05 DIAGNOSIS — M810 Age-related osteoporosis without current pathological fracture: Secondary | ICD-10-CM

## 2017-02-05 DIAGNOSIS — R197 Diarrhea, unspecified: Secondary | ICD-10-CM

## 2017-02-05 DIAGNOSIS — Z87891 Personal history of nicotine dependence: Secondary | ICD-10-CM

## 2017-02-05 DIAGNOSIS — F1020 Alcohol dependence, uncomplicated: Secondary | ICD-10-CM

## 2017-02-05 DIAGNOSIS — Z809 Family history of malignant neoplasm, unspecified: Secondary | ICD-10-CM

## 2017-02-05 DIAGNOSIS — D539 Nutritional anemia, unspecified: Secondary | ICD-10-CM

## 2017-02-05 DIAGNOSIS — G4700 Insomnia, unspecified: Secondary | ICD-10-CM

## 2017-02-05 LAB — COPPER, SERUM: Copper: 37 ug/dL — ABNORMAL LOW (ref 72–166)

## 2017-02-05 LAB — RETICULOCYTES
RBC.: 2.23 MIL/uL — AB (ref 3.80–5.20)
RETIC CT PCT: 3.2 % — AB (ref 0.4–3.1)
Retic Count, Absolute: 71.4 10*3/uL (ref 19.0–183.0)

## 2017-02-05 LAB — COMPREHENSIVE METABOLIC PANEL
ALT: 35 U/L (ref 14–54)
ANION GAP: 3 — AB (ref 5–15)
AST: 46 U/L — ABNORMAL HIGH (ref 15–41)
Albumin: 2.2 g/dL — ABNORMAL LOW (ref 3.5–5.0)
Alkaline Phosphatase: 101 U/L (ref 38–126)
BILIRUBIN TOTAL: 1.4 mg/dL — AB (ref 0.3–1.2)
BUN: 16 mg/dL (ref 6–20)
CO2: 29 mmol/L (ref 22–32)
Calcium: 7.6 mg/dL — ABNORMAL LOW (ref 8.9–10.3)
Chloride: 106 mmol/L (ref 101–111)
Creatinine, Ser: <0.3 mg/dL — ABNORMAL LOW (ref 0.44–1.00)
Glucose, Bld: 85 mg/dL (ref 65–99)
POTASSIUM: 3.4 mmol/L — AB (ref 3.5–5.1)
Sodium: 138 mmol/L (ref 135–145)
TOTAL PROTEIN: 4.2 g/dL — AB (ref 6.5–8.1)

## 2017-02-05 LAB — CBC
HEMATOCRIT: 28 % — AB (ref 35.0–47.0)
Hemoglobin: 8.8 g/dL — ABNORMAL LOW (ref 12.0–16.0)
MCH: 35.5 pg — ABNORMAL HIGH (ref 26.0–34.0)
MCHC: 31.5 g/dL — AB (ref 32.0–36.0)
MCV: 112.7 fL — AB (ref 80.0–100.0)
Platelets: 114 10*3/uL — ABNORMAL LOW (ref 150–440)
RBC: 2.49 MIL/uL — ABNORMAL LOW (ref 3.80–5.20)
RDW: 17.1 % — AB (ref 11.5–14.5)
WBC: 6.1 10*3/uL (ref 3.6–11.0)

## 2017-02-05 LAB — LACTATE DEHYDROGENASE: LDH: 197 U/L — AB (ref 98–192)

## 2017-02-05 MED ORDER — LORAZEPAM 0.5 MG PO TABS
0.5000 mg | ORAL_TABLET | Freq: Every evening | ORAL | Status: DC | PRN
  Administered 2017-02-05 – 2017-02-10 (×4): 0.5 mg via ORAL
  Filled 2017-02-05 (×4): qty 1

## 2017-02-05 MED ORDER — ALBUMIN HUMAN 25 % IV SOLN
25.0000 g | Freq: Once | INTRAVENOUS | Status: AC
  Administered 2017-02-05: 25 g via INTRAVENOUS
  Filled 2017-02-05: qty 100

## 2017-02-05 MED ORDER — TRACE MINERALS CR-CU-MN-SE-ZN 10-1000-500-60 MCG/ML IV SOLN
Freq: Every day | INTRAVENOUS | Status: AC
  Administered 2017-02-05 – 2017-02-09 (×5): via INTRAVENOUS
  Filled 2017-02-05 (×5): qty 250

## 2017-02-05 MED ORDER — RISAQUAD PO CAPS
2.0000 | ORAL_CAPSULE | Freq: Three times a day (TID) | ORAL | Status: DC
  Administered 2017-02-05 – 2017-02-11 (×17): 2 via ORAL
  Filled 2017-02-05 (×19): qty 2

## 2017-02-05 MED ORDER — SODIUM CHLORIDE 0.9 % IV SOLN: Freq: Every day | INTRAVENOUS | Status: DC

## 2017-02-05 MED ORDER — VITAMIN B-12 1000 MCG PO TABS
500.0000 ug | ORAL_TABLET | Freq: Every day | ORAL | Status: DC
  Administered 2017-02-06 – 2017-02-11 (×6): 500 ug via ORAL
  Filled 2017-02-05 (×6): qty 1

## 2017-02-05 MED FILL — Medication: Qty: 1 | Status: AC

## 2017-02-05 NOTE — Care Management (Signed)
Patient requires additional dose of albumin today.  Husband agreeable to see if patient would be a candidate for [facility].  Referral made to [facility].  Rectal tube still in place.

## 2017-02-05 NOTE — Plan of Care (Signed)
Problem: Health Behavior/Discharge Planning: Goal: Ability to manage health-related needs will improve Outcome: Not Progressing Needs assistance.  Problem: Activity: Goal: Risk for activity intolerance will decrease Outcome: Not Progressing Patient is in process of working with PT.

## 2017-02-05 NOTE — Consult Note (Signed)
[place] CONSULT NOTE  Patient Care Team: Cletis Athens, MD as PCP - General (Internal Medicine)  CHIEF COMPLAINTS/PURPOSE OF CONSULTATION:  Macrocytic anemia  HISTORY OF PRESENTING ILLNESS: The patient has facial injuries/ husband is the primary source of history Sonya Park 76 y.o.  female with a history of alcoholism/abuse; history of gastric bypass- is currently admitted to the hospital status post fall/sepsis. Patient has been treated with antibiotics; and also during the hospitalization underwent DTs.   During the hospitalization patient was noted to have elevated bilirubin around 12; and also had severe thrombocytopenia with platelet count of 14. Patient's hemoglobin was around 7.4; MCV of 110 to 117.   Patient currently has diarrhea; she also has Foley catheter. She has boots on her legs to avoid any ulcers. She is awaiting admission to skilled facility.  ROS:  Difficult to assess given patient's limited historian  MEDICAL HISTORY:  Past Medical History:  Diagnosis Date  . Arthritis   . Back pain   . Insomnia   . Medical history non-contributory   . Osteoporosis     SURGICAL HISTORY: Past Surgical History:  Procedure Laterality Date  . ABDOMINAL HYSTERECTOMY    . BREAST LUMPECTOMY Right   . COLONOSCOPY WITH PROPOFOL N/A 07/18/2015   Procedure: COLONOSCOPY WITH PROPOFOL;  Surgeon: [name], MD;  Location: [facility] ENDOSCOPY;  Service: Endoscopy;  Laterality: N/A;  . COSMETIC SURGERY    . GASTRIC BYPASS     X2  . HEMORROIDECTOMY    . KNEE ARTHROPLASTY Right 12/13/2015   Procedure: COMPUTER ASSISTED TOTAL KNEE ARTHROPLASTY;  Surgeon: Dereck Leep, MD;  Location: [facility] ORS;  Service: Orthopedics;  Laterality: Right;  . KNEE ARTHROSCOPY    . REPLACEMENT TOTAL KNEE Left     SOCIAL HISTORY: Social History   Social History  . Marital status: Married    Spouse name: N/A  . Number of children: N/A  . Years of education: N/A   Occupational History   . Not on file.   Social History Main Topics  . Smoking status: Former Smoker    Quit date: 11/30/1994  . Smokeless tobacco: Never Used  . Alcohol use 12.6 - 21.0 oz/week    21 - 35 Glasses of wine per week     Comment: 3-5 glasses of wine/ day  . Drug use: No  . Sexual activity: Not on file   Other Topics Concern  . Not on file   Social History Narrative  . No narrative on file    FAMILY HISTORY: Family History  Problem Relation Age of Onset  . Liver disease Mother   . Heart attack Father   . Cancer Father   . CAD Sister     ALLERGIES:  is allergic to other and penicillins.  MEDICATIONS:  Current Facility-Administered Medications  Medication Dose Route Frequency Provider Last Rate Last Dose  . acidophilus (RISAQUAD) capsule 2 capsule  2 capsule Oral TID Vaughan Basta, MD   2 capsule at 02/05/17 2143  . bacitracin ointment 1 application  1 application Topical BID Lance Coon, MD   1 application at 26/83/41 2143  . chlorhexidine gluconate (MEDLINE KIT) (PERIDEX) 0.12 % solution 15 mL  15 mL Mouth Rinse BID Wilhelmina Mcardle, MD   15 mL at 02/05/17 2142  . cholecalciferol (VITAMIN D) tablet 2,000 Units  2,000 Units Oral Daily Dustin Flock, MD   2,000 Units at 02/05/17 1034  . collagenase (SANTYL) ointment   Topical Daily Vaughan Basta,  MD      . Copper Gluconate TABS 1 tablet  1 tablet Oral Daily Loletha Grayer, MD   1 tablet at 02/05/17 1038  . diphenhydrAMINE-zinc acetate (BENADRYL) 2-0.1 % cream   Topical TID PRN Earlie Counts, NP      . enoxaparin (LOVENOX) injection 40 mg  40 mg Subcutaneous Q24H Wilhelmina Mcardle, MD   40 mg at 02/05/17 2142  . feeding supplement (ENSURE ENLIVE) (ENSURE ENLIVE) liquid 237 mL  237 mL Oral TID BM Vaughan Basta, MD   237 mL at 02/05/17 2142  . ferrous sulfate tablet 325 mg  325 mg Oral BID WC Vaughan Basta, MD   325 mg at 02/05/17 1650  . folic acid (FOLVITE) tablet 1 mg  1 mg Oral Daily [name], MD   1 mg at 02/05/17 1033  . furosemide (LASIX) injection 20 mg  20 mg Intravenous Q12H Dustin Flock, MD   20 mg at 02/05/17 1650  . ipratropium-albuterol (DUONEB) 0.5-2.5 (3) MG/3ML nebulizer solution 3 mL  3 mL Nebulization Q6H PRN Dustin Flock, MD      . loperamide (IMODIUM) capsule 2 mg  2 mg Oral Q6H Vaughan Basta, MD   2 mg at 02/05/17 2144  . LORazepam (ATIVAN) tablet 0.5 mg  0.5 mg Oral QHS PRN Vaughan Basta, MD   0.5 mg at 02/05/17 2205  . modafinil (PROVIGIL) tablet 100 mg  100 mg Oral Daily Dustin Flock, MD   100 mg at 02/05/17 1034  . multivitamin liquid 15 mL  15 mL Oral Daily [name], MD   15 mL at 02/05/17 1037  . ondansetron (ZOFRAN) injection 4 mg  4 mg Intravenous Q6H PRN Lance Coon, MD      . potassium chloride 20 MEQ/15ML (10%) solution 40 mEq  40 mEq Oral BID Vaughan Basta, MD   40 mEq at 02/05/17 2143  . rifaximin (XIFAXAN) tablet 550 mg  550 mg Oral BID [name], MD   550 mg at 02/05/17 2144  . sodium chloride 0.9 % 250 mL with trace elements Cr-Cu-Mn-Se-Zn (MTE-5 Concentrate) 1 mL infusion   Intravenous Daily Vaughan Basta, MD 50 mL/hr at 02/05/17 1650    . sodium chloride flush (NS) 0.9 % injection 10-40 mL  10-40 mL Intracatheter Q12H Dustin Flock, MD   10 mL at 02/05/17 2143  . sodium chloride flush (NS) 0.9 % injection 10-40 mL  10-40 mL Intracatheter PRN Dustin Flock, MD   10 mL at 01/28/17 2239  . thiamine (VITAMIN B-1) tablet 100 mg  100 mg Oral Daily [name], MD   100 mg at 02/05/17 1038  . traMADol (ULTRAM) tablet 50 mg  50 mg Oral Q6H PRN [name], MD   50 mg at 02/05/17 1039  . [START ON 02/06/2017] vitamin B-12 (CYANOCOBALAMIN) tablet 500 mcg  500 mcg Oral Daily Vaughan Basta, MD      . vitamin C (ASCORBIC ACID) tablet 500 mg  500 mg Oral Daily Vaughan Basta, MD   500 mg at 02/05/17 1037  . zinc sulfate capsule 220 mg  220 mg Oral Daily [name], MD   220 mg at  02/05/17 1037      .  PHYSICAL EXAMINATION:  Vitals:   02/05/17 1227 02/05/17 2022  BP: 132/65 119/73  Pulse: 94 (!) 103  Resp:  20  Temp: 98.4 F (36.9 C) 98.7 F (37.1 C)  SpO2: 97% 97%   Filed Weights   02/03/17 0439 02/04/17 0517 02/05/17  0417  Weight: 164 lb (74.4 kg) 165 lb (74.8 kg) 138 lb (62.6 kg)    GENERAL: Well-nourished well-developed; Alert, no distress and comfortable. Obvious facial injuries noted on the left of the face. EYES: no pallor or icterus OROPHARYNX: no thrush or ulceration. NECK: supple, no masses felt LYMPH:  no palpable lymphadenopathy in the cervical, axillary or inguinal regions LUNGS: decreased breath sounds to auscultation at bases and  No wheeze or crackles HEART/CVS: regular rate & rhythm and no murmurs; No lower extremity edema ABDOMEN: abdomen soft, non-tender and normal bowel sounds; positive for rectal tube; Foley catheter. Musculoskeletal:no cyanosis of digits and no clubbing  PSYCH: alert & oriented x 3 with fluent speech NEURO: no focal motor/sensory deficits SKIN:  Multiple bruises;   LABORATORY DATA:  I have reviewed the data as listed Lab Results  Component Value Date   WBC 6.1 02/05/2017   HGB 8.8 (L) 02/05/2017   HCT 28.0 (L) 02/05/2017   MCV 112.7 (H) 02/05/2017   PLT 114 (L) 02/05/2017    Recent Labs  01/16/17 0421  01/22/17 0449  01/29/17 1339  02/02/17 0345 02/04/17 0334 02/05/17 0411  NA 139  < > 137  < > 145  < > 140 138 138  K 3.9  < > 3.9  < > 3.2*  < > 3.6 3.2* 3.4*  CL 103  < > 107  < > 113*  < > 107 106 106  CO2 20*  < > 26  < > 27  < > _0 GLUCOSE 62*  < > 117*  < > 77  < > 86 91 85  BUN 13  < > 43*  < > 26*  < > _1 CREATININE 1.01*  < > 0.80  < > 0.37*  < > 0.30* 0.36* <0.30*  CALCIUM 6.0*  < > 7.3*  < > 6.8*  < > 7.6* 7.9* 7.6*  GFRNONAA 53*  < > >60  < > >60  < > >60 >60 NOT CALCULATED  GFRAA >60  < > >60  < > >60  < > >60 >60 NOT CALCULATED  PROT 3.7*  < > 3.8*  < > 3.9*  < >  4.0* 4.4* 4.2*  ALBUMIN 1.7*  < > 1.6*  < > 1.5*  < > 2.2* 2.2* 2.2*  AST 182*  < > 240*  < > 126*  < > 70* 59* 46*  ALT 50  < > 65*  < > 92*  < > 52 42 35  ALKPHOS 89  < > 252*  < > 167*  < > 98 109 101  BILITOT 3.3*  < > 10.8*  < > 2.2*  < > 1.5* 1.7* 1.4*  BILIDIR 2.1*  --  6.5*  --  0.9*  --   --   --   --   IBILI 1.2*  --  4.3*  --  1.3*  --   --   --   --   < > = values in this interval not displayed.  RADIOGRAPHIC STUDIES: I have personally reviewed the radiological images as listed and agreed with the findings in the report. Dg Abd 1 View  Result Date: 01/22/2017 CLINICAL DATA:  Feeding tube placement. EXAM: ABDOMEN - 1 VIEW COMPARISON:  Supine abdominal radiograph of January 17, 2017 FINDINGS: A he radiodense tipped feeding tube is present. The tip of the tube appears to lie in the proximal duodenum. IMPRESSION: The feeding  tube tip overlies the proximal duodenum. Electronically Signed   By: David  Martinique M.D.   On: 01/22/2017 12:00   Dg Abd 1 View  Result Date: 01/17/2017 CLINICAL DATA:  Placement of orogastric tube EXAM: ABDOMEN - 1 VIEW COMPARISON:  Radiographs 10//10/18 FINDINGS: NG tube extends the stomach.  Side port below the GE junction. IMPRESSION: NG tube in stomach.  Side port below the GE junction Electronically Signed   By: Suzy Bouchard M.D.   On: 01/17/2017 21:25   Ct Head Wo Contrast  Result Date: 01/27/2017 CLINICAL DATA:  Increased confusion, extensive facial bruising from prior fall-already imaged EXAM: CT HEAD WITHOUT CONTRAST TECHNIQUE: Contiguous axial images were obtained from the base of the skull through the vertex without intravenous contrast. COMPARISON:  None. FINDINGS: Brain: No intracranial hemorrhage. No parenchymal contusion. No midline shift or mass effect. Basilar cisterns are patent. No skull base fracture. No fluid in the paranasal sinuses or mastoid air cells. Orbits are normal. There are periventricular and subcortical white matter  hypodensities. Generalized cortical atrophy. Vascular: No hyperdense vessel or unexpected calcification. Skull: Normal. Negative for fracture or focal lesion. Sinuses/Orbits: Paranasal sinuses and mastoid air cells are clear. Orbits are clear. Other: Small midline frontal scalp hematoma. IMPRESSION: 1. No intracranial trauma. 2. Small frontal scalp hematoma. 3. Atrophy and white matter microvascular disease per Electronically Signed   By: Suzy Bouchard M.D.   On: 01/27/2017 15:17   Ct Head Wo Contrast  Result Date: 01/14/2017 CLINICAL DATA:  Fall.  Trauma to front of head. EXAM: CT HEAD WITHOUT CONTRAST TECHNIQUE: Contiguous axial images were obtained from the base of the skull through the vertex without intravenous contrast. COMPARISON:  08/01/2015 FINDINGS: Brain: Expected cerebral volume loss for age. Mild low density in the periventricular white matter likely related to small vessel disease. No mass lesion, hemorrhage, hydrocephalus, acute infarct, intra-axial, or extra-axial fluid collection. Vascular: Intracranial atherosclerosis. Skull: Soft tissue swelling superficial to the superior aspect of with nose and about the frontal sinuses. This extends minimally into the right frontal scalp. No skull fracture. Sinuses/Orbits: Normal imaged portions of the orbits and globes. There is osseous irregularity involving both nasal bones, felt to be relatively similar to 10/16/2012. Partial ethmoid air cell opacification. hyperattenuating material in the left frontal sinus is new. Clear mastoid air cells. Other: None. IMPRESSION: 1. Periorbital and frontal scalp soft tissue thickening, without acute intracranial abnormality. 2. New hyperattenuating material in the left frontal sinus could represent hemorrhage. No acute fracture is seen. If this is a clinical concern, consider dedicated face CT. 3. Osseous irregularity about both nasal bones is felt to be grossly similar back to 10/16/2012. Correlate with clinical  exam to exclude acute superimposed injury. 4.  Cerebral atrophy and small vessel ischemic change. Electronically Signed   By: Abigail Miyamoto M.D.   On: 01/14/2017 13:30   Ct Chest W Contrast  Result Date: 01/14/2017 CLINICAL DATA:  Fall. EXAM: CT CHEST, ABDOMEN, AND PELVIS WITH CONTRAST TECHNIQUE: Multidetector CT imaging of the chest, abdomen and pelvis was performed following the standard protocol during bolus administration of intravenous contrast. CONTRAST:  153m ISOVUE-300 IOPAMIDOL (ISOVUE-300) INJECTION 61% COMPARISON:  01/17/2011 FINDINGS: CT CHEST FINDINGS Cardiovascular: Aortic atherosclerosis. Calcification in the LAD coronary artery noted. Normal heart size. No pericardial effusion. Mediastinum/Nodes: No enlarged mediastinal, hilar, or axillary lymph nodes. Thyroid gland, trachea, and esophagus demonstrate no significant findings. Lungs/Pleura: No pleural effusion identified. No pulmonary contusion or pneumothorax. 4 mm right middle lobe pulmonary nodule identified,  image 85 of series 4. Left lower lobe nodule measures 3 mm, image 104 of series 4. Musculoskeletal: Spondylosis noted within the thoracic spine. Inferior endplate deformity involving the T12 vertebra is age indeterminate. No displaced rib fractures identified. CT ABDOMEN PELVIS FINDINGS Hepatobiliary: Marked diffuse hepatic steatosis. No focal liver abnormality. The gallbladder appears normal. No biliary dilatation. Pancreas: Unremarkable. No pancreatic ductal dilatation or surrounding inflammatory changes. Spleen: Spleen is unremarkable. Adrenals/Urinary Tract: No adrenal hemorrhage or renal injury identified. Bladder is unremarkable. 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 No pathologic dilatation of the large or small bowel loops. Vascular/Lymphatic: Aortic atherosclerosis. No aneurysm. No upper abdominal adenopathy. No pelvic or inguinal adenopathy. Reproductive: Status post hysterectomy. No adnexal  masses. Other: No scratch set trace free fluid within the pelvis. No focal fluid collections. Musculoskeletal: Healed fracture deformity involving the right inferior pubic rami noted. No acute pelvic fracture noted. Chronic progressive superior endplate deformity involving the L3 vertebra. IMPRESSION: 1. Age-indeterminate T12 inferior endplate compression deformity. Progressive chronic inferior endplate deformity involves the L3 vertebra. 2. No evidence for solid organ or hollow viscus injury. 3. Small pulmonary nodules measure up to 4 mm. Nonspecific. No follow-up needed if patient is low-risk (and has no known or suspected primary neoplasm). Non-contrast chest CT can be considered in 12 months if patient is high-risk. This recommendation follows the consensus statement: Guidelines for Management of Incidental Pulmonary Nodules Detected on CT Images: From the Fleischner Society 2017; Radiology 2017; 284:228-243. 4. Aortic Atherosclerosis (ICD10-I70.0). Lad coronary artery calcification noted. 5. Hepatic steatosis. Electronically Signed   By: Kerby Moors M.D.   On: 01/14/2017 21:54   US Abdomen Complete  Result Date: 01/29/2017 CLINICAL DATA:  Elevated LFTs. EXAM: ABDOMEN ULTRASOUND COMPLETE COMPARISON:  Right upper quadrant ultrasound 01/21/2017. CT of the abdomen and pelvis 01/14/2017. FINDINGS: Gallbladder: The gallbladder wall is markedly thickened, measuring up to 8.8 mm. A 1.1 cm shadowing stone is again noted at the neck of the gallbladder. There is no sonographic Murphy sign. Layering sludge is present. Common bile duct: Diameter: 4.1 mm, within normal limits. Liver: There is diffuse fatty infiltration of liver. No discrete lesions are present. Portal vein is patent on color Doppler imaging with normal direction of blood flow towards the liver. IVC: No abnormality visualized. Pancreas: Visualized portion unremarkable. Spleen: Size and appearance within normal limits. Maximal length is 4.8 cm. Right  Kidney: Length: 10.4 cm, within normal limits. Echogenicity within normal limits. No mass or hydronephrosis visualized. Left Kidney: Length: 11.7 cm, within normal limits. Echogenicity within normal limits. No mass or hydronephrosis visualized. Abdominal aorta: Maximal diameter is 2.7 cm. Atherosclerotic changes are noted. Other findings: Increasing diffuse abdominal ascites is present. IMPRESSION: 1. Progressive wall thickening of the gallbladder with a 1.1 cm shadowing stone at the neck of the gallbladder. Findings are concerning for developing acute cholecystitis. 2. Progressive abdominal ascites. 3. Hepatic steatosis. Electronically Signed   By: San Morelle M.D.   On: 01/29/2017 14:41   Ct Abdomen Pelvis W Contrast  Result Date: 01/14/2017 CLINICAL DATA:  Fall. EXAM: CT CHEST, ABDOMEN, AND PELVIS WITH CONTRAST TECHNIQUE: Multidetector CT imaging of the chest, abdomen and pelvis was performed following the standard protocol during bolus administration of intravenous contrast. CONTRAST:  187m ISOVUE-300 IOPAMIDOL (ISOVUE-300) INJECTION 61% COMPARISON:  01/17/2011 FINDINGS: CT CHEST FINDINGS Cardiovascular: Aortic atherosclerosis. Calcification in the LAD coronary artery noted. Normal heart size. No pericardial effusion. Mediastinum/Nodes: No enlarged mediastinal, hilar, or axillary lymph nodes. Thyroid gland, trachea, and esophagus  demonstrate no significant findings. Lungs/Pleura: No pleural effusion identified. No pulmonary contusion or pneumothorax. 4 mm right middle lobe pulmonary nodule identified, image 85 of series 4. Left lower lobe nodule measures 3 mm, image 104 of series 4. Musculoskeletal: Spondylosis noted within the thoracic spine. Inferior endplate deformity involving the T12 vertebra is age indeterminate. No displaced rib fractures identified. CT ABDOMEN PELVIS FINDINGS Hepatobiliary: Marked diffuse hepatic steatosis. No focal liver abnormality. The gallbladder appears normal. No  biliary dilatation. Pancreas: Unremarkable. No pancreatic ductal dilatation or surrounding inflammatory changes. Spleen: Spleen is unremarkable. Adrenals/Urinary Tract: No adrenal hemorrhage or renal injury identified. Bladder is unremarkable. 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 No pathologic dilatation of the large or small bowel loops. Vascular/Lymphatic: Aortic atherosclerosis. No aneurysm. No upper abdominal adenopathy. No pelvic or inguinal adenopathy. Reproductive: Status post hysterectomy. No adnexal masses. Other: No scratch set trace free fluid within the pelvis. No focal fluid collections. Musculoskeletal: Healed fracture deformity involving the right inferior pubic rami noted. No acute pelvic fracture noted. Chronic progressive superior endplate deformity involving the L3 vertebra. IMPRESSION: 1. Age-indeterminate T12 inferior endplate compression deformity. Progressive chronic inferior endplate deformity involves the L3 vertebra. 2. No evidence for solid organ or hollow viscus injury. 3. Small pulmonary nodules measure up to 4 mm. Nonspecific. No follow-up needed if patient is low-risk (and has no known or suspected primary neoplasm). Non-contrast chest CT can be considered in 12 months if patient is high-risk. This recommendation follows the consensus statement: Guidelines for Management of Incidental Pulmonary Nodules Detected on CT Images: From the Fleischner Society 2017; Radiology 2017; 284:228-243. 4. Aortic Atherosclerosis (ICD10-I70.0). Lad coronary artery calcification noted. 5. Hepatic steatosis. Electronically Signed   By: Kerby Moors M.D.   On: 01/14/2017 21:54   US Venous Img Lower Unilateral Left  Result Date: 01/15/2017 CLINICAL DATA:  Lower extremity edema EXAM: LEFT LOWER EXTREMITY VENOUS DOPPLER ULTRASOUND TECHNIQUE: Gray-scale sonography with graded compression, as well as color Doppler and duplex ultrasound were performed to evaluate the  lower extremity deep venous systems from the level of the common femoral vein and including the common femoral, femoral, profunda femoral, popliteal and calf veins including the posterior tibial, peroneal and gastrocnemius veins when visible. The superficial great saphenous vein was also interrogated. Spectral Doppler was utilized to evaluate flow at rest and with distal augmentation maneuvers in the common femoral, femoral and popliteal veins. COMPARISON:  11/02/2009 FINDINGS: Contralateral Common Femoral Vein: Respiratory phasicity is normal and symmetric with the symptomatic side. No evidence of thrombus. Normal compressibility. Common Femoral Vein: No evidence of thrombus. Normal compressibility, respiratory phasicity and response to augmentation. Saphenofemoral Junction: No evidence of thrombus. Normal compressibility and flow on color Doppler imaging. Profunda Femoral Vein: No evidence of thrombus. Normal compressibility and flow on color Doppler imaging. Femoral Vein: No evidence of thrombus. Normal compressibility, respiratory phasicity and response to augmentation. Popliteal Vein: No evidence of thrombus. Normal compressibility, respiratory phasicity and response to augmentation. Calf Veins: No evidence of thrombus. Normal compressibility and flow on color Doppler imaging. Other Findings:  None. IMPRESSION: No evidence of DVT within the left lower extremity. Electronically Signed   By: Misty Stanley M.D.   On: 01/15/2017 09:32   Dg Chest Port 1 View  Result Date: 01/26/2017 CLINICAL DATA:  Acute respiratory failure EXAM: PORTABLE CHEST 1 VIEW COMPARISON:  01/23/2017 FINDINGS: A small bore feeding tube is noted entering the stomach with tip off the field of view. A right IJ central venous catheter has  been removed. Increased hazy opacity overlying the lower right hemithorax probably represents layering effusion and atelectasis. Left lower lung consolidation/atelectasis and effusion again noted. There is  no evidence of pneumothorax. IMPRESSION: Increased opacity overlying the lower right hemithorax likely representing layering effusion and atelectasis. Unchanged left lower lung consolidation/ atelectasis and small left effusion. Right IJ central venous catheter removed. Electronically Signed   By: Margarette Canada M.D.   On: 01/26/2017 10:08   Dg Chest Port 1 View  Result Date: 01/23/2017 CLINICAL DATA:  Respiratory failure. EXAM: PORTABLE CHEST 1 VIEW COMPARISON:  01/22/2017. FINDINGS: Interim removal of endotracheal tube and NG tube. Right IJ line in stable position. Interim slight clearing of bibasilar atelectasis and improvement of left pleural effusion. No pneumothorax. IMPRESSION: 1. An removal of endotracheal tube and NG tube. Right IJ line stable position. 2. Interim slight clearing of bibasilar atelectasis and left pleural effusion. Electronically Signed   By: Marcello Moores  Register   On: 01/23/2017 06:35   Dg Chest Port 1 View  Result Date: 01/22/2017 CLINICAL DATA:  Hyperbilirubinemia. EXAM: PORTABLE CHEST 1 VIEW COMPARISON:  Radiograph January 20, 2017. FINDINGS: Stable cardiomediastinal silhouette. Atherosclerosis of thoracic aorta is noted. Endotracheal and nasogastric tubes are unchanged in position. Right internal jugular catheter is unchanged with distal tip in expected position of the SVC. No pneumothorax is noted. Stable bilateral pleural effusions are noted, left greater than right, with probable associated atelectasis. Bony thorax is unremarkable. IMPRESSION: Aortic atherosclerosis. Stable support apparatus. Stable bilateral pleural effusions are noted with probable associated atelectasis. Electronically Signed   By: Marijo Conception, M.D.   On: 01/22/2017 07:42   Dg Chest Port 1 View  Result Date: 01/20/2017 CLINICAL DATA:  Respiratory failure, intubated patient, sepsis, former smoker. EXAM: PORTABLE CHEST 1 VIEW COMPARISON:  Chest x-ray of January 16, 2017 FINDINGS: The lungs are  well-expanded. Pleural effusions layer posteriorly, bilaterally. The retrocardiac region remains dense on the left. The heart is top-normal in size. The pulmonary vascularity is normal. There is calcification in the wall of the aortic arch. The endotracheal tube tip lies 3.1 cm above the carina. The esophagogastric tube and right internal jugular venous catheter are in reasonable position. The observed bony thorax exhibits no acute abnormality. IMPRESSION: Interval worsening in the appearance of the chest with moderate-sized bilateral pleural effusions layering posteriorly. Persistent left lower lobe atelectasis or pneumonia. No overt pulmonary edema. Thoracic aortic atherosclerosis. Electronically Signed   By: David  Martinique M.D.   On: 01/20/2017 10:22   Dg Chest Port 1 View  Result Date: 01/16/2017 CLINICAL DATA:  Respiratory failure. EXAM: PORTABLE CHEST 1 VIEW COMPARISON:  01/15/2017. FINDINGS: Endotracheal tube, NG tube, right IJ line stable position. Heart size stable. Persistent atelectasis/ consolidation left lower lobe. Persistent small left pleural effusion. No pneumothorax. Chest is unchanged. IMPRESSION: 1. Lines and tubes in stable position. 2. Persistent left lower lobe atelectasis and consolidation and small left pleural effusion. No interim change from prior exam. Electronically Signed   By: Marcello Moores  Register   On: 01/16/2017 06:30   Dg Chest Port 1 View  Result Date: 01/15/2017 CLINICAL DATA:  76 year old female with a history of intubation EXAM: PORTABLE CHEST 1 VIEW COMPARISON:  01/15/2017, CT 01/14/2017 FINDINGS: Cardiomediastinal silhouette unchanged in size and contour. Calcifications of the aortic arch. Interval placement of endotracheal tube which terminates approximately 3 cm 2.8 cm above the carina. Unchanged right IJ central catheter which appears to terminate superior vena cava. No pneumothorax. Opacities at the bilateral lung bases  with increasing opacity on the left. Partial  obscuration left hemidiaphragm. IMPRESSION: Interval placement of endotracheal tube which terminates approximately 2.8 cm above the carina. Unchanged right IJ central catheter. Increasing opacity at the left base, potentially combination of pleural fluid and/or atelectasis/consolidation. Electronically Signed   By: Corrie Mckusick D.O.   On: 01/15/2017 19:50   Dg Chest Port 1 View  Result Date: 01/15/2017 CLINICAL DATA:  Central line placement.  Initial encounter. EXAM: PORTABLE CHEST 1 VIEW COMPARISON:  Chest radiograph performed 04/25/2014 FINDINGS: A right IJ line is noted ending about the mid SVC. The lungs are well-aerated and clear. There is no evidence of focal opacification, pleural effusion or pneumothorax. The cardiomediastinal silhouette is within normal limits. No acute osseous abnormalities are seen. IMPRESSION: Right IJ line noted ending about the mid SVC. Lungs clear bilaterally. Electronically Signed   By: Garald Balding M.D.   On: 01/15/2017 04:16   Dg Abd Portable 1v  Result Date: 01/15/2017 CLINICAL DATA:  Nasogastric tube placement.  Initial encounter. EXAM: PORTABLE ABDOMEN - 1 VIEW COMPARISON:  CT of the abdomen and pelvis from 01/14/2017 FINDINGS: The patient's enteric tube is noted ending overlying the body of the stomach. The visualized bowel gas pattern is unremarkable. Scattered air and stool filled loops of colon are seen; no abnormal dilatation of small bowel loops is seen to suggest small bowel obstruction. No free intra-abdominal air is identified, though evaluation for free air is limited on a single supine view. The visualized osseous structures are within normal limits; the sacroiliac joints are unremarkable in appearance. IMPRESSION: Enteric tube noted ending overlying the body of the stomach. Electronically Signed   By: Garald Balding M.D.   On: 01/15/2017 21:59   Dg Ugi W/high Density W/kub  Result Date: 01/28/2017 CLINICAL DATA:  History of gastric bypass.  Feeding  tube. EXAM: UPPER GI SERIES WITH KUB TECHNIQUE: After obtaining a scout radiograph a routine upper GI series was performed using thin density FLUOROSCOPY TIME:  Fluoroscopy Time:  1 minutes 54 seconds Radiation Exposure Index (if provided by the fluoroscopic device): 15.6 Number of Acquired Spot Images: 17 COMPARISON:  Abdomen 01/22/2017. FINDINGS: Feeding tube noted with tip over the upper abdomen. The patient was not able to drink barium through a straw annulus head using a splint. The patient would only drank a small amount of barium. The esophagus and gastric remnant are patent. Proximal small bowel is faintly visualized. Feeding tube tip most likely in an enteric loop. The patient would not tolerate any more imaging for further visualization of the gastroenteric anastomosis and proximal small bowel. IMPRESSION: Very limited exam as above. Esophagus and gastric remnant appear to be patent. Feeding tube noted with tip in enteric loop. Electronically Signed   By: Marcello Moores  Register   On: 01/28/2017 12:35   Ct Maxillofacial Wo Contrast  Result Date: 01/14/2017 CLINICAL DATA:  Fall last night. Bruising and swelling to the face and chest. EXAM: CT MAXILLOFACIAL WITHOUT CONTRAST TECHNIQUE: Multidetector CT imaging of the maxillofacial structures was performed. Multiplanar CT image reconstructions were also generated. COMPARISON:  CT head without contrast from the same day. FINDINGS: Osseous: Bilateral nasal fractures are present. Hemorrhage in the left frontal sinus suggests an occult fracture. No discrete fracture is present. The visualized calvarium is intact. The mandible is intact and located. Advanced degenerative changes are noted within the TMJ, worse on the left. Orbits: Periorbital soft tissue swelling and hematoma is worse right than left. There is no underlying globe injury.  Bilateral lens replacements are present. Sinuses: Minimal low-density fluid is present in the sphenoid sinuses bilaterally. Small  polyps or mucous retention cysts are noted inferiorly in the maxillary sinuses. Hemorrhages noted in the left frontal sinus. The remaining paranasal sinuses are clear. The mastoid air cells are clear. Soft tissues: Focal high-density hemorrhage is noted over the bridge of the nose in the midline. Bilateral periorbital soft tissue swelling hematoma is present. There is soft tissue edema over the left side of the face. Calcifications in the subcutaneous tissues of face bilaterally may be related to prior trauma or injections. The airway is patent. The visualized soft tissues the neck are within normal limits. Limited intracranial: Within normal limits. IMPRESSION: 1. Bilateral nasal bone fractures without significant displacement. 2. Left frontal hemorrhage suggest an occult fracture. No discrete fracture is visualized. 3. Extensive bilateral periorbital soft swelling and hematoma with inflammatory changes extending over the maxilla bilaterally, left greater than right. Electronically Signed   By: San Morelle M.D.   On: 01/14/2017 15:28   US Abdomen Limited Ruq  Result Date: 01/21/2017 CLINICAL DATA:  Elevated bilirubin EXAM: ULTRASOUND ABDOMEN LIMITED RIGHT UPPER QUADRANT COMPARISON:  CT abdomen pelvis of 01/14/2017 FINDINGS: Gallbladder: There is a single gallstone within the neck of the gallbladder of 1.4 cm. There is no pain over the gallbladder, but there is some thickening of the gallbladder wall. Developing acute cholecystitis cannot be excluded. Common bile duct: Diameter: The common bile duct measures 5.8 mm in diameter. Liver: The parenchyma of the liver is echogenic and inhomogeneous consistent with diffuse fatty infiltration. No focal hepatic abnormality is seen. Portal vein is patent on color Doppler imaging with normal direction of blood flow towards the liver. Small amount of ascites is noted adjacent to the liver as well as a small amount within all 4 quadrants. IMPRESSION: 1. Single 1.4  cm gallstone with focally thickened gallbladder wall. Cannot exclude developing acute cholecystitis. Correlate clinically. 2. Echogenic liver parenchyma consistent with fatty infiltration. 3. Some ascites is noted as described above. Electronically Signed   By: Ivar Drape M.D.   On: 01/21/2017 16:52    ASSESSMENT & PLAN:  76 year old female patient with a history of alcohol abuse- currently admitted to the hospital for sepsis/respiratory failure/fall noted to have macrocytic anemia.  # Macrocytic anemia- hemoglobin 7.5-8.5. Suspect likely from alcohol abuse/ history of gastric bypass; also copper deficiency. W38 and folic acid within normal limits. Check reticulocyte count. If this does not improve- a bone marrow biopsy could be considered for further evaluation. Check haptoglobin and LDH; and also P of pedicles..  # Mild thrombo-cytopenia platelets anywhere between 120-130s- again suspect secondary to alcoholism.  # Alcoholic liver disease- overall significant improvement.   # Multiple falls/sepsis/diarrhea- as per primary team.  Thank you Dr.Vachhani for allowing me to participate in the care of your pleasant patient. Please do not hesitate to contact me with questions or concerns in the interim. The above plan of care was discussed the patient/husband in detail.  All questions were answered. The patient knows to call the clinic with any problems, questions or concerns.    Cammie Sickle, MD 02/05/2017 10:36 PM

## 2017-02-05 NOTE — Progress Notes (Signed)
Nutrition Follow-up  DOCUMENTATION CODES:   Not applicable  INTERVENTION:   Will order 65ml multi-trace concentrate daily x 5 days to replete copper. (provides 1mg  copper)- Recommend recheck copper levels in 4 weeks  Ensure Enlive po TID, each supplement provides 350 kcal and 20 grams of protein  MVI liquid daily   Add Magic cup TID with meals, each supplement provides 290 kcal and 9 grams of protein  Folic acid 1mg  daily   Vitamin C 500mg  daily for wound healing   Thiamine 100mg  oral daily   Vitamin D 2000 units oral daily   Vitamin B-12 500 mcg oral daily   Copper gluconate tab daily- provides 2mg  copper  Recommend 16 mg elemental zinc PO for 6 months (200% RDI)- zinc labs pending   Recommend copper 1 mg PO for every 8-15mg  elemental zinc daily until zinc supplementation is complete  NUTRITION DIAGNOSIS:   Malnutrition (Moderate) related to chronic illness (hx Roux-en-Y gastric bypass 2012, anorexia following right BKA, EtOH abuse) as evidenced by moderate depletion of body fat, moderate depletions of muscle mass, moderate to severe fluid accumulation. 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 Intubated on 10/10. Extubated 01/22/2017  Pt's appetite continues to improve; eating 50-100% of  meals and drinking Ensure.  Per chart, pt with weight fluctuations r/t severe anasarca. Pt will need to drink 3 Ensure per day and eat at least 50% of her meals to meet her estimated needs. Pt will require adequate protein for wound healing. If pt unable to meet her estimated needs via oral intake recommend PEG tube placement if able (pt s/p Roux-en-Y 2012). Pt recommended for [facility] facility. RD managing replacement of micronutrients. Recommend micronutrients as listed above. Copper levels returned today and remain the same from 12/31/2016. Will order multi-trace concentrate IV daily to replete copper. Zinc labs pending. Recommend check phosphorus as pt is at risk for refeeding. Pt continues to have diarrhea; pt initiated on imodium. Pt may benefit from probiotics.    Medications reviewed and include: Vit D, ciprofloxacin, copper gluconate, lovenox, ferrous sulfate,  folic acid, lasix, imodium, MVI, KCl, thiamine, vit B-12, vit C, zinc, tramadol  Labs reviewed: K 3.4(L), creat <0.3(L), Ca 7.6(L) adj. 9.04 wnl, alb 2.2(L), AST 46(H), tbili 1.4(H) Cu- 37(L)- 10/28 Prealbumin <5(L)- 10/15 Iron 62 wnl, ferritin 404(H), folate 27(H)- 10/28 B-12- 4059(H)- 10/28 Hgb 8.8(L), Hct 28(L)  Diet Order:  DIET SOFT Room service appropriate? Yes; Fluid consistency: Thin  EDUCATION NEEDS:   Education needs addressed  Skin:  Wound (see comment) (Stg II buttocks, unstageable sacrum, deep tissue wound heel, weeping to arms and legs, cellulitis lower legs, blisters arms and legs  Last BM:  10/30- type 7- 515ml via rectal pouch - rectal tube   Height:   Ht Readings from Last 1 Encounters:  01/14/17 5\' 6"  (1.676 m)    Weight:   Wt  Readings from Last 1 Encounters:  02/05/17 138 lb (62.6 kg)    Ideal Body Weight:  59.1 kg  BMI:  Body mass index is 22.27 kg/m.  Estimated Nutritional Needs:   Kcal:  1475-1700 (MSJ x 1.3-1.5)  Protein:  85-100 grams (1.4-1.6 grams/kg)  Fluid:  1.5-1.8 L/day (25-30  ml/kg)  Koleen Distance MS, RD, LDN Pager #- [phone] After Hours Pager: [phone]

## 2017-02-05 NOTE — Clinical Social Work Note (Signed)
Clinical Social Work Assessment  Patient Details  Name: Sonya Park MRN: [id number] Date of Birth: 04/28/1940  Date of referral:  02/05/17               Reason for consult:  Facility Placement                Permission sought to share information with:  Facility Sport and exercise psychologist, Family Supports Permission granted to share information::  Yes, Verbal Permission Granted  Name::        Agency::     Relationship::     Contact Information:     Housing/Transportation Living arrangements for the past 2 months:  Single Family Home Source of Information:  Spouse Patient Interpreter Needed:  None Criminal Activity/Legal Involvement Pertinent to Current Situation/Hospitalization:  No - Comment as needed Significant Relationships:  Spouse Lives with:  Spouse Do you feel safe going back to the place where you live?    Need for family participation in patient care:  Yes (Comment)  Care giving concerns:  Patient has been living with her spouse in a trailor and fell out of the trailor and sustained injuries.   Social Worker assessment / plan:  RN CM is currently pursuing LTAC. If LTAC is not able to be approved, CSW has spoken to patient's husband regarding the two bed offers patient has had: [place] and [facility] [place]. CSW provided addresses and also the medicare.gov website to review star rating. Nothing further at this time unless LTAC is denied.  Employment status:  Retired Nurse, adult PT Recommendations:  [place], LTAC Information / Referral to community resources:     Patient/Family's Response to care:  Patient's husband expressed appreciation for CSW assistance.  Patient/Family's Understanding of and Emotional Response to Diagnosis, Current Treatment, and Prognosis:  Patient's husband verbalized understanding of process.  Emotional Assessment Appearance:  Appears stated age Attitude/Demeanor/Rapport:    (quiet) Affect (typically observed):  Calm, Quiet Orientation:  Oriented to Self Alcohol / Substance use:  Not Applicable Psych involvement (Current and /or in the community):  No (Comment)  Discharge Needs  Concerns to be addressed:  Care Coordination Readmission within the last 30 days:  No Current discharge risk:  None Barriers to Discharge:  No Barriers Identified   Shela Leff, LCSW 02/05/2017, 11:52 AM

## 2017-02-05 NOTE — Progress Notes (Signed)
Patient ID: Sonya Park, female   DOB: 1940/11/13, 76 y.o.   MRN: [id number]   [facility] PROGRESS NOTE  Sonya Park GYK:[id number] DOB: Dec 22, 1940 DOA: 01/14/2017 PCP: Sonya Athens, MD  HPI/Subjective: able to speak and oriented. Patient still has diarrhea and has a rectal tube in place and has Foley in place Patient had NG tube taken out, she did eat breakfast this morning Patient unable to do much with physical therapy  Still have significant swelling on her skin. Tried removing her Rectal tube , but continued to have liquid rectal output, so re-placed the tube. Started on imodium oral schedules.  Objective: Vitals:   02/05/17 0509 02/05/17 1227  BP: 131/71 132/65  Pulse: 96 94  Resp: 18   Temp: 97.8 F (36.6 C) 98.4 F (36.9 C)  SpO2: 96% 97%    Filed Weights   02/03/17 0439 02/04/17 0517 02/05/17 0417  Weight: 74.4 kg (164 lb) 74.8 kg (165 lb) 62.6 kg (138 lb)    ROS: Review of Systems  Constitutional: Positive for malaise/fatigue. Negative for chills and fever.  HENT: Negative for congestion, ear pain and sore throat.   Respiratory: Negative for hemoptysis and shortness of breath.   Cardiovascular: Negative for chest pain, orthopnea and leg swelling.  Gastrointestinal: Negative for abdominal pain, blood in stool, diarrhea and nausea.  Genitourinary: Negative for frequency and urgency.  Musculoskeletal: Negative for joint pain and myalgias.  Skin: Negative for rash.  Neurological: Positive for weakness. Negative for tingling, tremors and focal weakness.  Psychiatric/Behavioral: The patient is not nervous/anxious.    Exam: Physical Exam  HENT:  Nose: No mucosal edema.  Mouth/Throat: No oropharyngeal exudate.  Eyes: Pupils are equal, round, and reactive to light. Lids are normal.  Icteric.   Neck: Carotid bruit is not present. No thyromegaly present.  Cardiovascular: Regular rhythm, S1 normal and S2 normal.   Respiratory: She has decreased breath  sounds in the right lower field and the left lower field. She has no wheezes. She has no rhonchi. She has no rales.  GI: Soft. Bowel sounds are normal. There is no tenderness.  Musculoskeletal:       Right elbow: She exhibits no swelling.       Left elbow: She exhibits no swelling.       Right wrist: She exhibits no swelling.       Left wrist: She exhibits no swelling.       Right knee: She exhibits no swelling.       Left knee: She exhibits no swelling.       Right ankle: She exhibits no swelling.  Lymphadenopathy:    She has no cervical adenopathy.  Neurological: She is alert.  Patient barely able to straight leg raise. Patient able to lift arms up off the bed  Skin:  Dressing in place  Psychiatric: She has a normal mood and affect.      Data Reviewed: Basic Metabolic Panel:  Recent Labs Lab 01/31/17 1225 02/01/17 0535 02/02/17 0345 02/04/17 0334 02/05/17 0411  NA 140 141 140 138 138  K 3.8 3.4* 3.6 3.2* 3.4*  CL 105 109 107 106 106  CO2 '27 28 29 26 29  '$ GLUCOSE 93 80 86 91 85  BUN 23* 21* '20 19 16  '$ CREATININE <0.30* <0.30* 0.30* 0.36* <0.30*  CALCIUM 7.5* 7.4* 7.6* 7.9* 7.6*  MG  --   --   --  1.7  --    Liver Function Tests:  Recent Labs  Lab 02/01/17 0535 02/02/17 0345 02/04/17 0334 02/05/17 0411  AST 85* 70* 59* 46*  ALT 62* 52 42 35  ALKPHOS 131* 98 109 101  BILITOT 2.1* 1.5* 1.7* 1.4*  PROT 4.2* 4.0* 4.4* 4.2*  ALBUMIN 2.1* 2.2* 2.2* 2.2*   No results for input(s): AMMONIA in the last 168 hours. CBC:  Recent Labs Lab 01/31/17 1225 02/01/17 0535 02/02/17 0345 02/03/17 0544 02/04/17 0334 02/05/17 0411  WBC 7.2 6.9 6.0 5.7 6.3 6.1  NEUTROABS 5.9  --   --   --   --   --   HGB 7.4* 8.5* 8.0* 9.5* 8.7* 8.8*  HCT 22.8* 26.3* 24.4* 28.9* 27.3* 28.0*  MCV 114.7* 114.7* 114.1* 112.8* 112.6* 112.7*  PLT 131* 131* 113* 111* 111* 114*    CBG: No results for input(s): GLUCAP in the last 168 hours.  Recent Results (from the past 240 hour(s))   Aerobic Culture (superficial specimen)     Status: None   Collection Time: 01/28/17  3:28 PM  Result Value Ref Range Status   Specimen Description WOUND  Final   Special Requests Normal  Final   Gram Stain   Final    MODERATE WBC PRESENT, PREDOMINANTLY PMN NO ORGANISMS SEEN    Culture   Final    NO GROWTH 3 DAYS Performed at [facility], [address], [place], [place] [address]    Report Status 02/01/2017 FINAL  Final  C difficile quick screen w PCR reflex     Status: None   Collection Time: 02/02/17 12:32 PM  Result Value Ref Range Status   C Diff antigen NEGATIVE NEGATIVE Final   C Diff toxin NEGATIVE NEGATIVE Final   C Diff interpretation No C. difficile detected.  Final     Studies: No results found.  Scheduled Meds: . bacitracin  1 application Topical BID  . chlorhexidine gluconate (MEDLINE KIT)  15 mL Mouth Rinse BID  . cholecalciferol  2,000 Units Oral Daily  . collagenase   Topical Daily  . Copper Gluconate  1 tablet Oral Daily  . enoxaparin (LOVENOX) injection  40 mg Subcutaneous Q24H  . feeding supplement (ENSURE ENLIVE)  237 mL Oral TID BM  . ferrous sulfate  325 mg Oral BID WC  . folic acid  1 mg Oral Daily  . furosemide  20 mg Intravenous Q12H  . loperamide  2 mg Oral Q6H  . modafinil  100 mg Oral Daily  . multivitamin  15 mL Oral Daily  . potassium chloride  40 mEq Oral BID  . rifaximin  550 mg Oral BID  . sodium chloride flush  10-40 mL Intracatheter Q12H  . thiamine  100 mg Oral Daily  . [START ON 02/06/2017] vitamin B-12  500 mcg Oral Daily  . vitamin C  500 mg Oral Daily  . zinc sulfate  220 mg Oral Daily   Continuous Infusions: . sodium chloride 0.9 % 250 mL with trace elements Cr-Cu-Mn-Se-Zn (MTE-5 Concentrate) 1 mL infusion      Assessment/Plan: Patient is a 76 year old white female with alcoholic liver disease    1. Septic shock with Escherichia coli bacteremia.  status post treatment with Cipro was on meropenem due to  persistently elevated WBC count,  discontinue vancomycin since her blood cultures have been negative, appreciate ID input   As improving- changed again to cipro and flagyl and suggest stop on 02/03/17- stopped ABx.  2. Alcoholic liver disease with acute liver failure: patient's liver function are stable,  however clinically patient has evidence of ascites on her ultrasound, severe hypoalbuminemia with third spacing-  given her albumin multiple doses ,        Improving slowly.    give one more dose albumin today. 3. Acute respiratory failure with hypoxia. Patient extubated 01/22/2017. Currently on room air 4. Dysphasia with sore throat and upper GI series shows no evidence of obstruction : patient's NG tube has been removed, and her oral intake is improving daily      Dietician on the case. 5. Acute encephalopathy secondary to severe sepsis.  continue Xifaxan mental with some improvement continue Xifaxan and Provigil       Improved. 6. Pancytopenia could be secondary to alcohol abuse and/or sepsis. Hematology consult called in. 7.   Macrocytic anemia- B12 checked- high than normal. Need to follow with hematology as out pt.        Called consult. 8. Hypomagnesemia, hypocalcemia and hypokalemia. replaced 9. Severe malnutrition, Anasarca and third spacing of fluids.  Patient has been receiving IV albumin many dose, will give one more dose today. 10. Jaundice likely secondary to shock. Could also be secondary to prior alcohol abuse.jaundice improving   11. Facial trauma at home. Fractured nasal bones. Hematoma and bruising around the eyelids. 12. Anasarca continue low dose lasix 13. Prognosis very poor: patient still clinically ill, she needs aggressive management and would benefit from [place] working on approval. 14. Diarrhea- watery loose stool. Have rectal tube, checked C diff negative.        Now her diet is improving, and Abx are stopped- this may help. I also started on scheduled  Imodium, will try again to remove rectal tube.     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  Code Status:     Code Status Orders        Start     Ordered   01/15/17 0158  Full code  Continuous     01/15/17 0157    Code Status History    Date Active Date Inactive Code Status Order ID Comments User Context   12/13/2015 11:49 AM 12/15/2015  7:28 PM Full Code [id number]  Dereck Leep, MD Inpatient   09/17/2014  6:01 AM 09/18/2014  3:51 PM Full Code [id number]  Juluis Mire, MD Inpatient     Family Communication: spoke with husband at the bedside Disposition Plan: working on getting ready for rehab.   Consultants:  Antibiotics:  None now.  Time spent: 35 minutes  Falon Huesca, [facility]

## 2017-02-05 NOTE — Consult Note (Signed)
[facility] Nurse wound follow up Reason for Consult: follow up to left lower extremity wounds, including deep tissue injury to left heel Patient is awake and responsive and spouse at bedside. She would like her hair wash and NT present and agrees to this.  Wound type: blistering from edema, ruptured with sloughing epithelium and devitalized tissue.   Pressure Injury POA: NA Measurement: Left medial lower leg: 4 cm x 3.4 cm x 0.1 cm ruptured serum filled blister with scattered slough center and pink moist perimeter.  This has improved.  Left dorsal foot: 3 cm x 2 cm 100% slough to wound bed.  This has softened and is pulling away.    Wound bed:  Devitalized tissue Drainage (amount, consistency, odor) Serosanguinous drainage  No odor.  Periwound:Edema resolved Dressing procedure/placement/frequency:Cleanse leg daily with soap and water to removed sloughing epithelium.  Santyl to slough and nonadherent contact layer for atraumatic dressing removal.  Wounds have improved.  Will not follow at this time. Please re-consult if needed.  Domenic Moras RN BSN [place] Pager [phone]

## 2017-02-05 NOTE — Progress Notes (Signed)
Patient ID: Sonya Park, female   DOB: [birth year], 76 y.o.   MRN: [id number]   [facility] PROGRESS NOTE  Sonya Park:[id number] DOB: [birth year] DOA: 01/14/2017 PCP: Cletis Athens, MD  HPI/Subjective: able to speak and oriented. Patient still has diarrhea and has a rectal tube in place and has Foley in place Patient had NG tube taken out, she did eat breakfast this morning Patient unable to do much with physical therapy  Still have significant swelling on her skin. Tried removing her Rectal tube yesterday, but continued to have liquid rectal output, so re-placed the tube.  Objective: Vitals:   02/04/17 2130 02/05/17 0509  BP: 125/67 131/71  Pulse: 100 96  Resp: 18 18  Temp: 97.9 F (36.6 C) 97.8 F (36.6 C)  SpO2: 94% 96%    Filed Weights   02/03/17 0439 02/04/17 0517 02/05/17 0417  Weight: 74.4 kg (164 lb) 74.8 kg (165 lb) 62.6 kg (138 lb)    ROS: Review of Systems  Constitutional: Positive for malaise/fatigue. Negative for chills and fever.  HENT: Negative for congestion, ear pain and sore throat.   Respiratory: Negative for hemoptysis and shortness of breath.   Cardiovascular: Negative for chest pain, orthopnea and leg swelling.  Gastrointestinal: Negative for abdominal pain, blood in stool, diarrhea and nausea.  Genitourinary: Negative for frequency and urgency.  Musculoskeletal: Negative for joint pain and myalgias.  Skin: Negative for rash.  Neurological: Positive for weakness. Negative for tingling, tremors and focal weakness.  Psychiatric/Behavioral: The patient is not nervous/anxious.    Exam: Physical Exam  HENT:  Nose: No mucosal edema.  Mouth/Throat: No oropharyngeal exudate.  Eyes: Pupils are equal, round, and reactive to light. Lids are normal.  Icteric.   Neck: Carotid bruit is not present. No thyromegaly present.  Cardiovascular: Regular rhythm, S1 normal and S2 normal.   Respiratory: She has decreased breath sounds in the right lower  field and the left lower field. She has no wheezes. She has no rhonchi. She has no rales.  GI: Soft. Bowel sounds are normal. There is no tenderness.  Musculoskeletal:       Right elbow: She exhibits no swelling.       Left elbow: She exhibits no swelling.       Right wrist: She exhibits no swelling.       Left wrist: She exhibits no swelling.       Right knee: She exhibits no swelling.       Left knee: She exhibits no swelling.       Right ankle: She exhibits no swelling.  Lymphadenopathy:    She has no cervical adenopathy.  Neurological: She is alert.  Patient barely able to straight leg raise. Patient able to lift arms up off the bed  Skin:  Dressing in place  Psychiatric: She has a normal mood and affect.      Data Reviewed: Basic Metabolic Panel:  Recent Labs Lab 01/31/17 1225 02/01/17 0535 02/02/17 0345 02/04/17 0334 02/05/17 0411  NA 140 141 140 138 138  K 3.8 3.4* 3.6 3.2* 3.4*  CL 105 109 107 106 106  CO2 _0 GLUCOSE 93 80 86 91 85  BUN 23* 21* _1 CREATININE <0.30* <0.30* 0.30* 0.36* <0.30*  CALCIUM 7.5* 7.4* 7.6* 7.9* 7.6*  MG  --   --   --  1.7  --    Liver Function Tests:  Recent Labs Lab 01/29/17 1339 02/01/17 0535  02/02/17 0345 02/04/17 0334 02/05/17 0411  AST 126* 85* 70* 59* 46*  ALT 92* 62* 52 42 35  ALKPHOS 167* 131* 98 109 101  BILITOT 2.2* 2.1* 1.5* 1.7* 1.4*  PROT 3.9* 4.2* 4.0* 4.4* 4.2*  ALBUMIN 1.5* 2.1* 2.2* 2.2* 2.2*   No results for input(s): AMMONIA in the last 168 hours. CBC:  Recent Labs Lab 01/31/17 1225 02/01/17 0535 02/02/17 0345 02/03/17 0544 02/04/17 0334 02/05/17 0411  WBC 7.2 6.9 6.0 5.7 6.3 6.1  NEUTROABS 5.9  --   --   --   --   --   HGB 7.4* 8.5* 8.0* 9.5* 8.7* 8.8*  HCT 22.8* 26.3* 24.4* 28.9* 27.3* 28.0*  MCV 114.7* 114.7* 114.1* 112.8* 112.6* 112.7*  PLT 131* 131* 113* 111* 111* 114*    CBG: No results for input(s): GLUCAP in the last 168 hours.  Recent Results (from the past  240 hour(s))  CULTURE, BLOOD (ROUTINE X 2) w Reflex to ID Panel     Status: None   Collection Time: 01/26/17  4:08 PM  Result Value Ref Range Status   Specimen Description BLOOD BLOOD LEFT WRIST  Final   Special Requests   Final    BOTTLES DRAWN AEROBIC AND ANAEROBIC Blood Culture adequate volume   Culture NO GROWTH 5 DAYS  Final   Report Status 01/31/2017 FINAL  Final  CULTURE, BLOOD (ROUTINE X 2) w Reflex to ID Panel     Status: None   Collection Time: 01/26/17  4:08 PM  Result Value Ref Range Status   Specimen Description BLOOD BLOOD RIGHT WRIST  Final   Special Requests   Final    BOTTLES DRAWN AEROBIC AND ANAEROBIC Blood Culture results may not be optimal due to an inadequate volume of blood received in culture bottles   Culture NO GROWTH 5 DAYS  Final   Report Status 01/31/2017 FINAL  Final  Aerobic Culture (superficial specimen)     Status: None   Collection Time: 01/28/17  3:28 PM  Result Value Ref Range Status   Specimen Description WOUND  Final   Special Requests Normal  Final   Gram Stain   Final    MODERATE WBC PRESENT, PREDOMINANTLY PMN NO ORGANISMS SEEN    Culture   Final    NO GROWTH 3 DAYS Performed at [facility], [address], [place], [place] [address]    Report Status 02/01/2017 FINAL  Final  C difficile quick screen w PCR reflex     Status: None   Collection Time: 02/02/17 12:32 PM  Result Value Ref Range Status   C Diff antigen NEGATIVE NEGATIVE Final   C Diff toxin NEGATIVE NEGATIVE Final   C Diff interpretation No C. difficile detected.  Final     Studies: No results found.  Scheduled Meds: . bacitracin  1 application Topical BID  . chlorhexidine gluconate (MEDLINE KIT)  15 mL Mouth Rinse BID  . cholecalciferol  2,000 Units Oral Daily  . collagenase   Topical Daily  . Copper Gluconate  1 tablet Oral Daily  . enoxaparin (LOVENOX) injection  40 mg Subcutaneous Q24H  . feeding supplement (ENSURE ENLIVE)  237 mL Oral TID BM  . ferrous  sulfate  325 mg Oral BID WC  . folic acid  1 mg Oral Daily  . furosemide  20 mg Intravenous Q12H  . loperamide  2 mg Oral Q6H  . modafinil  100 mg Oral Daily  . multivitamin  15 mL Oral  Daily  . potassium chloride  40 mEq Oral BID  . rifaximin  550 mg Oral BID  . sodium chloride flush  10-40 mL Intracatheter Q12H  . thiamine  100 mg Oral Daily  . vitamin B-12  1,000 mcg Oral Daily  . vitamin C  500 mg Oral Daily  . zinc sulfate  220 mg Oral Daily   Continuous Infusions: . albumin human      Assessment/Plan: Patient is a 76 year old white female with alcoholic liver disease    1. Septic shock with Escherichia coli bacteremia.  status post treatment with Cipro Started on meropenem due to persistently elevated WBC count,  discontinue vancomycin since her blood cultures have been negative, appreciate ID input   As improving- changed again to cipro and flagyl and suggest stop on 02/03/17- stopped ABx.   This may help with her diarrhea also.  2. Alcoholic liver disease with acute liver failure: patient's liver function are stable, however clinically patient has evidence of ascites on her ultrasound, severe hypoalbuminemia with third spacing-  given her albumin multiple doses ,        Improving slowly.   Will give one more dose albumin today. 3. Acute respiratory failure with hypoxia. Patient extubated 01/22/2017. Currently on room air 4. Dysphasia with's sore throat and upper GI series shows no evidence of obstruction : patient's NG tube has been removed, and her oral intake is improving daily 5. Acute encephalopathy secondary to severe sepsis.  continue Xifaxan mental with some improvement continue Xifaxan and Provigil       Improved. 6. Pancytopenia could be secondary to alcohol abuse and/or sepsis. Platelet count recovered 7.   Macrocytic anemia- B12 checked- high than normal. Need to follow with hematology as out pt. 8. Hypomagnesemia, hypocalcemia and hypokalemia.  replaced 9. Severe malnutrition, Anasarca and third spacing of fluids.  Patient has been receiving IV albumin many dose, will give one more dose today. 10. Jaundice likely secondary to shock. Could also be secondary to prior alcohol abuse.jaundice improving   11. Facial trauma at home. Fractured nasal bones. Hematoma and bruising around the eyelids. 12. Anasarca continue low dose lasix 13. Prognosis very poor: patient still clinically ill, she needs aggressive management and would benefit from [place] working on approval. 14. Diarrhea- watery loose stool. Have rectal tube, checked C diff negative.        Now her diet is improving, and Abx are stopped- this may help. I also started on scheduled Imodium, will try again to remove rectal tube- as pt is denied for LTAC, so will have to prepare for rehab d/c.  Her PICC line is not working , removed, try removing rectal tube and check progression today, may qualify for rehab, as denied for LTAC.  Code Status:     Code Status Orders        Start     Ordered   01/15/17 0158  Full code  Continuous     01/15/17 0157    Code Status History    Date Active Date Inactive Code Status Order ID Comments User Context   12/13/2015 11:49 AM 12/15/2015  7:28 PM Full Code [id number]  Dereck Leep, MD Inpatient   09/17/2014  6:01 AM 09/18/2014  3:51 PM Full Code [id number]  Juluis Mire, MD Inpatient     Family Communication: spoke with husband at the bedside Disposition Plan: working on getting ready for rehab.   Consultants:  Antibiotics:  None now.  Time spent:  80 minutes  Nailani Full, [place]

## 2017-02-06 LAB — CBC WITH DIFFERENTIAL/PLATELET
Basophils Absolute: 0.1 10*3/uL (ref 0–0.1)
Basophils Relative: 1 %
Eosinophils Absolute: 0.4 10*3/uL (ref 0–0.7)
Eosinophils Relative: 5 %
HCT: 26.2 % — ABNORMAL LOW (ref 35.0–47.0)
Hemoglobin: 8.4 g/dL — ABNORMAL LOW (ref 12.0–16.0)
LYMPHS ABS: 1.2 10*3/uL (ref 1.0–3.6)
Lymphocytes Relative: 17 %
MCH: 36.1 pg — AB (ref 26.0–34.0)
MCHC: 32 g/dL (ref 32.0–36.0)
MCV: 112.8 fL — AB (ref 80.0–100.0)
MONOS PCT: 10 %
Monocytes Absolute: 0.7 10*3/uL (ref 0.2–0.9)
NEUTROS PCT: 67 %
Neutro Abs: 4.5 10*3/uL (ref 1.4–6.5)
PLATELETS: 131 10*3/uL — AB (ref 150–440)
RBC: 2.32 MIL/uL — AB (ref 3.80–5.20)
RDW: 17.2 % — ABNORMAL HIGH (ref 11.5–14.5)
WBC: 6.8 10*3/uL (ref 3.6–11.0)

## 2017-02-06 LAB — ALBUMIN: Albumin: 2.3 g/dL — ABNORMAL LOW (ref 3.5–5.0)

## 2017-02-06 LAB — ZINC: ZINC: 52 ug/dL — AB (ref 56–134)

## 2017-02-06 LAB — PHOSPHORUS: PHOSPHORUS: 3.1 mg/dL (ref 2.5–4.6)

## 2017-02-06 NOTE — Care Management (Signed)
Per Fabienne Bruns with [facility] pre-cert was started today for LTACH.

## 2017-02-06 NOTE — Progress Notes (Addendum)
Patient ID: Sonya Park, female   DOB: Jun 06, 1940, 76 y.o.   MRN: [id number]   [facility] PROGRESS NOTE  DARLEEN MOFFITT WNI:[id number] DOB: 11-23-1940 DOA: 01/14/2017 PCP: Cletis Athens, MD  HPI/Subjective: able to speak and oriented. Patient still has diarrhea and has a rectal tube in place and has Foley in place Patient had NG tube taken out, she did eat breakfast this morning Patient unable to do much with physical therapy  Still have significant swelling on her skin. Tried removing her Rectal tube , but continued to have liquid rectal output, so re-placed the tube. Started on imodium oral schedules.  Objective: Vitals:   02/06/17 0526 02/06/17 1323  BP: 119/79 116/67  Pulse: 94 (!) 102  Resp: 20 16  Temp: 98.2 F (36.8 C) 98.2 F (36.8 C)  SpO2: 95% 97%    Filed Weights   02/04/17 0517 02/05/17 0417 02/06/17 0500  Weight: 74.8 kg (165 lb) 62.6 kg (138 lb) 63.5 kg (140 lb)    ROS: Review of Systems  Constitutional: Positive for malaise/fatigue. Negative for chills and fever.  HENT: Negative for congestion, ear pain and sore throat.   Respiratory: Negative for hemoptysis and shortness of breath.   Cardiovascular: Negative for chest pain, orthopnea and leg swelling.  Gastrointestinal: Negative for abdominal pain, blood in stool, diarrhea and nausea.  Genitourinary: Negative for frequency and urgency.  Musculoskeletal: Negative for joint pain and myalgias.  Skin: Negative for rash.  Neurological: Positive for weakness. Negative for tingling, tremors and focal weakness.  Psychiatric/Behavioral: The patient is not nervous/anxious.    Exam: Physical Exam  HENT:  Nose: No mucosal edema.  Mouth/Throat: No oropharyngeal exudate.  Eyes: Pupils are equal, round, and reactive to light. Lids are normal.  Icteric.   Neck: Carotid bruit is not present. No thyromegaly present.  Cardiovascular: Regular rhythm, S1 normal and S2 normal.   Respiratory: She has decreased  breath sounds in the right lower field and the left lower field. She has no wheezes. She has no rhonchi. She has no rales.  GI: Soft. Bowel sounds are normal. There is no tenderness.  Musculoskeletal:       Right elbow: She exhibits no swelling.       Left elbow: She exhibits no swelling.       Right wrist: She exhibits no swelling.       Left wrist: She exhibits no swelling.       Right knee: She exhibits no swelling.       Left knee: She exhibits no swelling.       Right ankle: She exhibits no swelling.  Lymphadenopathy:    She has no cervical adenopathy.  Neurological: She is alert.  Patient barely able to straight leg raise. Patient able to lift arms up off the bed  Skin:  Dressing in place  Psychiatric: She has a normal mood and affect.      Data Reviewed: Basic Metabolic Panel:  Recent Labs Lab 01/31/17 1225 02/01/17 0535 02/02/17 0345 02/04/17 0334 02/05/17 0411 02/06/17 0411  NA 140 141 140 138 138  --   K 3.8 3.4* 3.6 3.2* 3.4*  --   CL 105 109 107 106 106  --   CO2 '27 28 29 26 29  '$ --   GLUCOSE 93 80 86 91 85  --   BUN 23* 21* '20 19 16  '$ --   CREATININE <0.30* <0.30* 0.30* 0.36* <0.30*  --   CALCIUM 7.5* 7.4* 7.6* 7.9*  7.6*  --   MG  --   --   --  1.7  --   --   PHOS  --   --   --   --   --  3.1   Liver Function Tests:  Recent Labs Lab 02/01/17 0535 02/02/17 0345 02/04/17 0334 02/05/17 0411 02/06/17 0411  AST 85* 70* 59* 46*  --   ALT 62* 52 42 35  --   ALKPHOS 131* 98 109 101  --   BILITOT 2.1* 1.5* 1.7* 1.4*  --   PROT 4.2* 4.0* 4.4* 4.2*  --   ALBUMIN 2.1* 2.2* 2.2* 2.2* 2.3*   No results for input(s): AMMONIA in the last 168 hours. CBC:  Recent Labs Lab 01/31/17 1225  02/02/17 0345 02/03/17 0544 02/04/17 0334 02/05/17 0411 02/06/17 0411  WBC 7.2  < > 6.0 5.7 6.3 6.1 6.8  NEUTROABS 5.9  --   --   --   --   --  4.5  HGB 7.4*  < > 8.0* 9.5* 8.7* 8.8* 8.4*  HCT 22.8*  < > 24.4* 28.9* 27.3* 28.0* 26.2*  MCV 114.7*  < > 114.1* 112.8*  112.6* 112.7* 112.8*  PLT 131*  < > 113* 111* 111* 114* 131*  < > = values in this interval not displayed.  CBG: No results for input(s): GLUCAP in the last 168 hours.  Recent Results (from the past 240 hour(s))  Aerobic Culture (superficial specimen)     Status: None   Collection Time: 01/28/17  3:28 PM  Result Value Ref Range Status   Specimen Description WOUND  Final   Special Requests Normal  Final   Gram Stain   Final    MODERATE WBC PRESENT, PREDOMINANTLY PMN NO ORGANISMS SEEN    Culture   Final    NO GROWTH 3 DAYS Performed at [facility], [address], [place], [place] [address]    Report Status 02/01/2017 FINAL  Final  C difficile quick screen w PCR reflex     Status: None   Collection Time: 02/02/17 12:32 PM  Result Value Ref Range Status   C Diff antigen NEGATIVE NEGATIVE Final   C Diff toxin NEGATIVE NEGATIVE Final   C Diff interpretation No C. difficile detected.  Final     Studies: No results found.  Scheduled Meds: . acidophilus  2 capsule Oral TID  . bacitracin  1 application Topical BID  . chlorhexidine gluconate (MEDLINE KIT)  15 mL Mouth Rinse BID  . cholecalciferol  2,000 Units Oral Daily  . collagenase   Topical Daily  . Copper Gluconate  1 tablet Oral Daily  . enoxaparin (LOVENOX) injection  40 mg Subcutaneous Q24H  . feeding supplement (ENSURE ENLIVE)  237 mL Oral TID BM  . ferrous sulfate  325 mg Oral BID WC  . folic acid  1 mg Oral Daily  . furosemide  20 mg Intravenous Q12H  . loperamide  2 mg Oral Q6H  . modafinil  100 mg Oral Daily  . multivitamin  15 mL Oral Daily  . potassium chloride  40 mEq Oral BID  . rifaximin  550 mg Oral BID  . sodium chloride flush  10-40 mL Intracatheter Q12H  . thiamine  100 mg Oral Daily  . vitamin B-12  500 mcg Oral Daily  . vitamin C  500 mg Oral Daily  . zinc sulfate  220 mg Oral Daily   Continuous Infusions: . sodium chloride 0.9 % 250  mL with trace elements Cr-Cu-Mn-Se-Zn (MTE-5  Concentrate) 1 mL infusion 50 mL/hr at 02/05/17 1650    Assessment/Plan: Patient is a 76 year old white female with alcoholic liver disease    1. Septic shock with Escherichia coli bacteremia.  status post treatment with Cipro was on meropenem due to persistently elevated WBC count,  discontinue vancomycin since her blood cultures have been negative, appreciate ID input   As improving- changed again to cipro and flagyl and suggest stop on 02/03/17- stopped ABx.  2. Alcoholic liver disease with acute liver failure: patient's liver function are stable, however clinically patient has evidence of ascites on her ultrasound, severe hypoalbuminemia with third spacing-  given her albumin multiple doses ,        Improving slowly.     3. Acute respiratory failure with hypoxia. Patient extubated 01/22/2017. Currently on room air 4. Dysphasia with sore throat and upper GI series shows no evidence of obstruction : patient's NG tube has been removed, and her oral intake is improving daily      Dietician on the case. 5. Acute encephalopathy secondary to severe sepsis.  continue Xifaxan mental with some improvement continue Xifaxan and Provigil       Improved. 6. Pancytopenia could be secondary to alcohol abuse and/or sepsis. Hematology consult called in. 7.   Macrocytic anemia- B12 checked- high than normal. Need to follow with hematology as out pt.        Called consult.   Ordered peripheral smear review, could be due to low Copper. 8. Hypomagnesemia, hypocalcemia and hypokalemia. replaced 9. Severe malnutrition, Anasarca and third spacing of fluids.  Patient has been receiving IV albumin many dose. 10. Jaundice likely secondary to shock. Could also be secondary to prior alcohol abuse.jaundice improving   11. Facial trauma at home. Fractured nasal bones. Hematoma and bruising around the eyelids. 12. Anasarca continue low dose lasix 13. Prognosis very poor: patient still clinically ill, she needs  aggressive management and would benefit from [place] working on approval. 14. Diarrhea- watery loose stool. Have rectal tube, checked C diff negative.        Now her diet is improving, and Abx are stopped- this may help. I also started on scheduled Imodium, will try again to remove rectal tube- failed once.     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 A sper dietician- for 5 days, recheck.  Code Status:     Code Status Orders        Start     Ordered   01/15/17 0158  Full code  Continuous     01/15/17 0157    Code Status History    Date Active Date Inactive Code Status Order ID Comments User Context   12/13/2015 11:49 AM 12/15/2015  7:28 PM Full Code [id number]  Dereck Leep, MD Inpatient   09/17/2014  6:01 AM 09/18/2014  3:51 PM Full Code [id number]  Juluis Mire, MD Inpatient     Family Communication: spoke with husband at the bedside Disposition Plan: may need LTAC.   Consultants:  Antibiotics:  None now.  Time spent: 35 minutes  Caprisha Bridgett, [facility]

## 2017-02-07 LAB — COMPREHENSIVE METABOLIC PANEL
ALK PHOS: 85 U/L (ref 38–126)
ALT: 25 U/L (ref 14–54)
AST: 42 U/L — ABNORMAL HIGH (ref 15–41)
Albumin: 2.3 g/dL — ABNORMAL LOW (ref 3.5–5.0)
Anion gap: 6 (ref 5–15)
BUN: 13 mg/dL (ref 6–20)
CALCIUM: 7.6 mg/dL — AB (ref 8.9–10.3)
CO2: 26 mmol/L (ref 22–32)
CREATININE: 0.36 mg/dL — AB (ref 0.44–1.00)
Chloride: 108 mmol/L (ref 101–111)
GFR calc non Af Amer: >60 mL/min (ref >60)
Glucose, Bld: 79 mg/dL (ref 65–99)
Potassium: 4.1 mmol/L (ref 3.5–5.1)
SODIUM: 140 mmol/L (ref 135–145)
Total Bilirubin: 1.4 mg/dL — ABNORMAL HIGH (ref 0.3–1.2)
Total Protein: 4.1 g/dL — ABNORMAL LOW (ref 6.5–8.1)

## 2017-02-07 LAB — CBC
HCT: 27.3 % — ABNORMAL LOW (ref 35.0–47.0)
Hemoglobin: 9 g/dL — ABNORMAL LOW (ref 12.0–16.0)
MCH: 37.2 pg — ABNORMAL HIGH (ref 26.0–34.0)
MCHC: 33 g/dL (ref 32.0–36.0)
MCV: 113 fL — AB (ref 80.0–100.0)
PLATELETS: 149 10*3/uL — AB (ref 150–440)
RBC: 2.42 MIL/uL — AB (ref 3.80–5.20)
RDW: 17.8 % — AB (ref 11.5–14.5)
WBC: 7.7 10*3/uL (ref 3.6–11.0)

## 2017-02-07 LAB — PATHOLOGIST SMEAR REVIEW

## 2017-02-07 LAB — HAPTOGLOBIN: HAPTOGLOBIN: 54 mg/dL (ref 34–200)

## 2017-02-07 MED ORDER — ALBUMIN HUMAN 25 % IV SOLN
25.0000 g | Freq: Once | INTRAVENOUS | Status: AC
  Administered 2017-02-07: 25 g via INTRAVENOUS
  Filled 2017-02-07 (×2): qty 100

## 2017-02-07 MED ORDER — ALBUMIN HUMAN 25 % IV SOLN
25.0000 g | Freq: Once | INTRAVENOUS | Status: AC
  Administered 2017-02-07: 25 g via INTRAVENOUS
  Filled 2017-02-07: qty 100

## 2017-02-07 NOTE — Progress Notes (Signed)
Noted earlier during shift that small blood clot was in foley bag, no blood noted in subsequent voids in foley bag. Patient approached nurses station asking if patients urine could be assessed because it looked like blood was in it. Upon assessment urine noted to be bright red in color with absence of any clots. Hospitalist notified Dr. Alva Garnet. He discontinued Lovenox and tramadol and said to continue to monitor urine.

## 2017-02-07 NOTE — Progress Notes (Signed)
Patient ID: Sonya Park, female   DOB: [birth year], 76 y.o.   MRN: [id number]   [facility] PROGRESS NOTE  Sonya Park MOQ:[id number] DOB: 10-06-1940 DOA: 01/14/2017 PCP: Cletis Athens, MD  HPI/Subjective: able to speak and oriented. Patient still has diarrhea and has a rectal tube in place and has Foley in place Patient had NG tube taken out, she did eat breakfast this morning Patient unable to do much with physical therapy  Still have significant swelling on her skin. Tried removing her Rectal tube , but continued to have liquid rectal output, so re-placed the tube. Started on imodium oral schedules.  Had a drop in BP on standing with PT today.  Objective: Vitals:   02/07/17 1245 02/07/17 1411  BP: 114/62 119/67  Pulse: 92 92  Resp:  16  Temp:  98.1 F (36.7 C)  SpO2:  96%    Filed Weights   02/05/17 0417 02/06/17 0500 02/07/17 0500  Weight: 62.6 kg (138 lb) 63.5 kg (140 lb) 64.4 kg (142 lb)    ROS: Review of Systems  Constitutional: Positive for malaise/fatigue. Negative for chills and fever.  HENT: Negative for congestion, ear pain and sore throat.   Respiratory: Negative for hemoptysis and shortness of breath.   Cardiovascular: Negative for chest pain, orthopnea and leg swelling.  Gastrointestinal: Negative for abdominal pain, blood in stool, diarrhea and nausea.  Genitourinary: Negative for frequency and urgency.  Musculoskeletal: Negative for joint pain and myalgias.  Skin: Negative for rash.  Neurological: Positive for weakness. Negative for tingling, tremors and focal weakness.  Psychiatric/Behavioral: The patient is not nervous/anxious.    Exam: Physical Exam  HENT:  Nose: No mucosal edema.  Mouth/Throat: No oropharyngeal exudate.  Eyes: Pupils are equal, round, and reactive to light. Lids are normal.  Icteric.   Neck: Carotid bruit is not present. No thyromegaly present.  Cardiovascular: Regular rhythm, S1 normal and S2 normal.   Respiratory:  She has decreased breath sounds in the right lower field and the left lower field. She has no wheezes. She has no rhonchi. She has no rales.  GI: Soft. Bowel sounds are normal. There is no tenderness.  Musculoskeletal:       Right elbow: She exhibits no swelling.       Left elbow: She exhibits no swelling.       Right wrist: She exhibits no swelling.       Left wrist: She exhibits no swelling.       Right knee: She exhibits no swelling.       Left knee: She exhibits no swelling.       Right ankle: She exhibits no swelling.  Lymphadenopathy:    She has no cervical adenopathy.  Neurological: She is alert.  Patient barely able to straight leg raise. Patient able to lift arms up off the bed  Skin:  Dressing in place  Psychiatric: She has a normal mood and affect.  On right great toe tip- 1 cm size dry gangrene, no redness present.    Data Reviewed: Basic Metabolic Panel:  Recent Labs Lab 02/01/17 0535 02/02/17 0345 02/04/17 0334 02/05/17 0411 02/06/17 0411 02/07/17 0350  NA 141 140 138 138  --  140  K 3.4* 3.6 3.2* 3.4*  --  4.1  CL 109 107 106 106  --  108  CO2 '28 29 26 29  '$ --  26  GLUCOSE 80 86 91 85  --  79  BUN 21* '20 19 16  '$ --  13  CREATININE <0.30* 0.30* 0.36* <0.30*  --  0.36*  CALCIUM 7.4* 7.6* 7.9* 7.6*  --  7.6*  MG  --   --  1.7  --   --   --   PHOS  --   --   --   --  3.1  --    Liver Function Tests:  Recent Labs Lab 02/01/17 0535 02/02/17 0345 02/04/17 0334 02/05/17 0411 02/06/17 0411 02/07/17 0350  AST 85* 70* 59* 46*  --  42*  ALT 62* 52 42 35  --  25  ALKPHOS 131* 98 109 101  --  85  BILITOT 2.1* 1.5* 1.7* 1.4*  --  1.4*  PROT 4.2* 4.0* 4.4* 4.2*  --  4.1*  ALBUMIN 2.1* 2.2* 2.2* 2.2* 2.3* 2.3*   No results for input(s): AMMONIA in the last 168 hours. CBC:  Recent Labs Lab 02/03/17 0544 02/04/17 0334 02/05/17 0411 02/06/17 0411 02/07/17 0350  WBC 5.7 6.3 6.1 6.8 7.7  NEUTROABS  --   --   --  4.5  --   HGB 9.5* 8.7* 8.8* 8.4* 9.0*   HCT 28.9* 27.3* 28.0* 26.2* 27.3*  MCV 112.8* 112.6* 112.7* 112.8* 113.0*  PLT 111* 111* 114* 131* 149*    CBG: No results for input(s): GLUCAP in the last 168 hours.  Recent Results (from the past 240 hour(s))  C difficile quick screen w PCR reflex     Status: None   Collection Time: 02/02/17 12:32 PM  Result Value Ref Range Status   C Diff antigen NEGATIVE NEGATIVE Final   C Diff toxin NEGATIVE NEGATIVE Final   C Diff interpretation No C. difficile detected.  Final     Studies: No results found.  Scheduled Meds: . acidophilus  2 capsule Oral TID  . bacitracin  1 application Topical BID  . chlorhexidine gluconate (MEDLINE KIT)  15 mL Mouth Rinse BID  . cholecalciferol  2,000 Units Oral Daily  . collagenase   Topical Daily  . Copper Gluconate  1 tablet Oral Daily  . enoxaparin (LOVENOX) injection  40 mg Subcutaneous Q24H  . feeding supplement (ENSURE ENLIVE)  237 mL Oral TID BM  . ferrous sulfate  325 mg Oral BID WC  . folic acid  1 mg Oral Daily  . furosemide  20 mg Intravenous Q12H  . loperamide  2 mg Oral Q6H  . modafinil  100 mg Oral Daily  . multivitamin  15 mL Oral Daily  . potassium chloride  40 mEq Oral BID  . rifaximin  550 mg Oral BID  . sodium chloride flush  10-40 mL Intracatheter Q12H  . thiamine  100 mg Oral Daily  . vitamin B-12  500 mcg Oral Daily  . vitamin C  500 mg Oral Daily  . zinc sulfate  220 mg Oral Daily   Continuous Infusions: . sodium chloride 0.9 % 250 mL with trace elements Cr-Cu-Mn-Se-Zn (MTE-5 Concentrate) 1 mL infusion 50 mL/hr at 02/06/17 1645    Assessment/Plan: Patient is a 76 year old white female with alcoholic liver disease    1. Septic shock with Escherichia coli bacteremia.  status post treatment with Cipro was on meropenem due to persistently elevated WBC count,  discontinue vancomycin since her blood cultures have been negative, appreciate ID input   As improving- changed again to cipro and flagyl and suggest stop on  02/03/17- stopped ABx.  2. Alcoholic liver disease with acute liver failure: patient's liver function are stable, however clinically patient has  evidence of ascites on her ultrasound, severe hypoalbuminemia with third spacing-  given her albumin multiple doses ,        Improving slowly.     3. Acute respiratory failure with hypoxia. Patient extubated 01/22/2017. Currently on room air 4. Dysphasia with sore throat and upper GI series shows no evidence of obstruction : patient's NG tube has been removed, and her oral intake is improving daily      Dietician on the case. 5. Acute encephalopathy secondary to severe sepsis.  continue Xifaxan mental with some improvement continue Xifaxan and Provigil       Improved. 6. Pancytopenia could be secondary to alcohol abuse and/or sepsis. Hematology consult called in. 7.   Macrocytic anemia- B12 checked- high than normal. Need to follow with hematology as out pt.        Called consult.      Ordered peripheral smear review, could be due to low Copper. 8. Hypomagnesemia, hypocalcemia and hypokalemia. replaced 9. Severe malnutrition, Anasarca and third spacing of fluids.  Patient has been receiving IV albumin many dose. 10. Jaundice likely secondary to shock. Could also be secondary to prior alcohol abuse.jaundice improving   11. Facial trauma at home. Fractured nasal bones. Hematoma and bruising around the eyelids. 12. Anasarca continue low dose lasix 13. Prognosis very poor: patient still clinically ill, she needs aggressive management and would benefit from [place] working on approval. 14. Diarrhea- watery loose stool. Have rectal tube, checked C diff negative.        Now her diet is improving, and Abx are stopped- this may help. I also started on scheduled Imodium, will try again to remove rectal tube- failed once.     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   Rectal tube came out during PT today. 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 A sper dietician- for 5 days, recheck.  Code Status:     Code Status Orders        Start     Ordered   01/15/17 0158  Full code  Continuous     01/15/17 0157    Code Status History    Date Active Date Inactive Code Status Order ID Comments User Context   12/13/2015 11:49 AM 12/15/2015  7:28 PM Full Code [id number]  Dereck Leep, MD Inpatient   09/17/2014  6:01 AM 09/18/2014  3:51 PM Full Code [id number]  Juluis Mire, MD Inpatient     Family Communication: spoke with husband at the bedside Disposition Plan: may need LTAC.  I have doen peer to peer review with LTAC and [place] health physician today. Consultants:  Antibiotics:  None now.  Time spent: 55 minutes  Craig Ionescu, [place]

## 2017-02-07 NOTE — Clinical Social Work Note (Signed)
Patient continues on IV albumen, IV trace minerals, and rectal tube. As long as she is receiving these, she is not SNF appropriate. LTAC , [facility], is still considering her so SNF placement on hold. Patient's husband aware that only two offers for SNF if patient is taken off those IV meds and rectal tube are [facility] and [facility] in [place]. Shela Leff MSW,LCSW [phone]

## 2017-02-07 NOTE — Care Management (Signed)
Received Denial from [facility].  MD to complete peer to peer

## 2017-02-07 NOTE — Progress Notes (Signed)
Physical Therapy Treatment Patient Details Name: Sonya Park MRN: [id number] DOB: 02/05/1941 Today's Date: 02/07/2017    History of Present Illness 76 y/o female who suffered a fall with considerable facial fractures and ended up being intubated 10/10-10/17 in CCU.  h/o ETOH abuse, e. coli (+)    PT Comments    Pt presented in bed with nsg present completing dressing change on RLE. Pt agreeable to attempt OOB activity. Pt required maxA supine to sitting. Once in sitting pt able to tolerate sitting EOB with intermittent support for approx 71mn with encouragement. Pt declined transfer to recliner despite max encouragement from PTA and husband ([name]. Pt c/o increased dizziness when sitting at EOB, BP checked 89/58 HR 114. Provided pt/family ed on orthostasis and encouraged to continue at minimum sitting EOB in following sessions. Per pt did not have dizziness in previous attempts thus nsg notified. Pt requesting to return to supine and pt agreeable to allow husband to assist with [facility] standing. PTA observed that rectal tube became dislodged/removed. Pt returned to supine total assist and PTA and husband assisted in rolling pt L/R to change sheets and clean pt. Pt able to provide some assistance in rolling when [facility]TA [facility] placement on bed rail.  Per nsg will currently leave rectal tube out and monitor. Husband discussed with PTA request to increase frequency as pt indicated increased motivation to participate in therapy this session and fair tolerance to sitting EOB. Pt would continue to benefit from skilled PT to increase sitting balance, general strengthening, endurance to improve overall functional mobility. Pt left in bed at end of session with call bell within reach and husband present and needs met.   Follow Up Recommendations  SNF (d/t LTACH denial)     Equipment Recommendations  Rolling walker with 5" wheels    Recommendations for Other Services        Precautions / Restrictions Restrictions Weight Bearing Restrictions: No    Mobility  Bed Mobility Overal bed mobility: Needs Assistance Bed Mobility: Rolling;Supine to Sit;Sit to Supine Rolling: Mod assist   Supine to sit: Max assist Sit to supine: Total assist   General bed mobility comments: Pt required assist for LE placement and truncal support. Provided HOH assist for rolling L/R  Transfers                    Ambulation/Gait                 Stairs            Wheelchair Mobility    Modified Rankin (Stroke Patients Only)       Balance Overall balance assessment: Needs assistance Sitting-balance support: Bilateral upper extremity supported;Feet supported Sitting balance-Leahy Scale: Poor Sitting balance - Comments: Pt initially requires modA for sitting at EOB, able to reach periods of minA to min guard however pt with increased anxiety when unsupported                                     Cognition Arousal/Alertness: Awake/alert Behavior During Therapy: WFL for tasks assessed/performed Overall Cognitive Status: Within Functional Limits for tasks assessed                                        Exercises  General Comments General comments (skin integrity, edema, etc.): Wounds on L foot, R great toe with decreased circulation/blackened, sacral wound       Pertinent Vitals/Pain Pain Assessment: Faces Faces Pain Scale: Hurts even more Pain Location: L arm Pain Intervention(s): Limited activity within patient's tolerance    Home Living                      Prior Function            PT Goals (current goals can now be found in the care plan section) Progress towards PT goals: Progressing toward goals    Frequency    Min 2X/week      PT Plan Current plan remains appropriate    Co-evaluation              AM-PAC PT "6 Clicks" Daily Activity  Outcome Measure  Difficulty  turning over in bed (including adjusting bedclothes, sheets and blankets)?: Unable Difficulty moving from lying on back to sitting on the side of the bed? : Unable Difficulty sitting down on and standing up from a chair with arms (e.g., wheelchair, bedside commode, etc,.)?: Unable Help needed moving to and from a bed to chair (including a wheelchair)?: Total Help needed walking in hospital room?: Total Help needed climbing 3-5 steps with a railing? : Total 6 Click Score: 6    End of Session Equipment Utilized During Treatment: Gait belt Activity Tolerance: Patient tolerated treatment well;Patient limited by fatigue Patient left: in bed;with call bell/phone within reach;with family/visitor present   PT Visit Diagnosis: Muscle weakness (generalized) (M62.81)     Time: 1150-1250 PT Time Calculation (min) (ACUTE ONLY): 60 min  Charges:  $Therapeutic Activity: 53-67 mins                      Brixon Zhen  Joycie Aerts, PTA 02/07/2017, 1:52 PM

## 2017-02-07 NOTE — Progress Notes (Signed)
Nursing reported hematuria.  Will hold Lovenox and tramadol and monitor closely

## 2017-02-07 NOTE — Care Management (Signed)
Notified by Fabienne Bruns from [facility] that they received denial.  MD paged to determine if he will compete a peer to peer

## 2017-02-08 DIAGNOSIS — D509 Iron deficiency anemia, unspecified: Secondary | ICD-10-CM

## 2017-02-08 DIAGNOSIS — D520 Dietary folate deficiency anemia: Secondary | ICD-10-CM

## 2017-02-08 DIAGNOSIS — F1010 Alcohol abuse, uncomplicated: Secondary | ICD-10-CM

## 2017-02-08 DIAGNOSIS — E610 Copper deficiency: Secondary | ICD-10-CM

## 2017-02-08 LAB — URINALYSIS, COMPLETE (UACMP) WITH MICROSCOPIC
BILIRUBIN URINE: NEGATIVE
GLUCOSE, UA: NEGATIVE mg/dL
KETONES UR: NEGATIVE mg/dL
LEUKOCYTES UA: NEGATIVE
Nitrite: NEGATIVE
PH: 8 (ref 5.0–8.0)
PROTEIN: NEGATIVE mg/dL
SQUAMOUS EPITHELIAL / LPF: NONE SEEN
Specific Gravity, Urine: 1.005 (ref 1.005–1.030)

## 2017-02-08 LAB — COMPREHENSIVE METABOLIC PANEL
ALBUMIN: 2.6 g/dL — AB (ref 3.5–5.0)
ALT: 22 U/L (ref 14–54)
ANION GAP: 6 (ref 5–15)
AST: 42 U/L — AB (ref 15–41)
Alkaline Phosphatase: 91 U/L (ref 38–126)
BILIRUBIN TOTAL: 1.2 mg/dL (ref 0.3–1.2)
BUN: 11 mg/dL (ref 6–20)
CHLORIDE: 105 mmol/L (ref 101–111)
CO2: 27 mmol/L (ref 22–32)
Calcium: 8 mg/dL — ABNORMAL LOW (ref 8.9–10.3)
Creatinine, Ser: 0.36 mg/dL — ABNORMAL LOW (ref 0.44–1.00)
GFR calc Af Amer: >60 mL/min (ref >60)
GFR calc non Af Amer: >60 mL/min (ref >60)
GLUCOSE: 87 mg/dL (ref 65–99)
POTASSIUM: 4 mmol/L (ref 3.5–5.1)
SODIUM: 138 mmol/L (ref 135–145)
TOTAL PROTEIN: 4.5 g/dL — AB (ref 6.5–8.1)

## 2017-02-08 LAB — CBC
HEMATOCRIT: 26.8 % — AB (ref 35.0–47.0)
HEMOGLOBIN: 8.5 g/dL — AB (ref 12.0–16.0)
MCH: 36.3 pg — ABNORMAL HIGH (ref 26.0–34.0)
MCHC: 31.9 g/dL — AB (ref 32.0–36.0)
MCV: 113.7 fL — ABNORMAL HIGH (ref 80.0–100.0)
PLATELETS: 149 10*3/uL — AB (ref 150–440)
RBC: 2.35 MIL/uL — AB (ref 3.80–5.20)
RDW: 17.3 % — AB (ref 11.5–14.5)
WBC: 6 10*3/uL (ref 3.6–11.0)

## 2017-02-08 MED ORDER — FUROSEMIDE 10 MG/ML IJ SOLN
20.0000 mg | Freq: Every day | INTRAMUSCULAR | Status: DC
  Administered 2017-02-09 – 2017-02-11 (×3): 20 mg via INTRAVENOUS
  Filled 2017-02-08 (×3): qty 4

## 2017-02-08 NOTE — Progress Notes (Signed)
Sonya Park   DOB:06/07/1940   YB#:[id number]    Subjective: Patient is currently resting in the bed. She still immobile because of her multiple injuries. Her rectal tube was taken out as her diarrhea has improved. She noted to have some blood in the Foley catheter. Currently improved.  Objective:  Vitals:   02/08/17 0344 02/08/17 1144  BP: 127/72 116/71  Pulse: 93 97  Resp: 16   Temp: (!) 97.5 F (36.4 C) 98.4 F (36.9 C)  SpO2: 95% 96%     Intake/Output Summary (Last 24 hours) at 02/08/17 1349 Last data filed at 02/08/17 5277  Gross per 24 hour  Intake              231 ml  Output             1500 ml  Net            -1269 ml    GENERAL: Well-nourished well-developed; Alert, no distress and comfortable. Obvious facial injuries noted on the left of the face. EYES: no pallor or icterus OROPHARYNX: no thrush or ulceration. NECK: supple, no masses felt LYMPH:  no palpable lymphadenopathy in the cervical, axillary or inguinal regions LUNGS: decreased breath sounds to auscultation at bases and  No wheeze or crackles HEART/CVS: regular rate & rhythm and no murmurs; No lower extremity edema ABDOMEN: abdomen soft, non-tender and normal bowel sounds; Foley catheter-no blood in the catheter. Musculoskeletal:no cyanosis of digits and no clubbing  PSYCH: alert & oriented x 3 with fluent speech NEURO: no focal motor/sensory deficits SKIN:  Multiple bruises;    Labs:  Lab Results  Component Value Date   WBC 6.0 02/08/2017   HGB 8.5 (L) 02/08/2017   HCT 26.8 (L) 02/08/2017   MCV 113.7 (H) 02/08/2017   PLT 149 (L) 02/08/2017   NEUTROABS 4.5 02/06/2017    Lab Results  Component Value Date   NA 138 02/08/2017   K 4.0 02/08/2017   CL 105 02/08/2017   CO2 27 02/08/2017    Studies:  No results found.  Assessment & Plan:   76 year old female patient with a history of alcohol abuse- currently admitted to the hospital for sepsis/respiratory failure/fall noted to have macrocytic  anemia.  # Macrocytic anemia- hemoglobin 7.5-8.5. Suspect likely from alcohol abuse/ history of gastric bypass; also copper deficiency. O24 and folic acid within normal limits.  Do not suspect hemolysis- as LDH/reticulocyte count/haptoglobin-normal. Recommend continued nutritional supplementation. However, if counts not improve over time- bone marrow biopsy would be indicated to rule out myelodysplastic syndrome.   # Mild thrombo-cytopenia platelets anywhere between 120-130s- again suspect secondary to alcoholism.  # Alcoholic liver disease- overall significant improvement.   # Multiple falls/sepsis/diarrhea- as per primary team. Discussed with Dr. Anselm Jungling.    Cammie Sickle, MD 02/08/2017  1:49 PM

## 2017-02-09 ENCOUNTER — Other Ambulatory Visit: Payer: Self-pay

## 2017-02-09 LAB — URINALYSIS, ROUTINE W REFLEX MICROSCOPIC
BILIRUBIN URINE: NEGATIVE
Glucose, UA: NEGATIVE mg/dL
Ketones, ur: NEGATIVE mg/dL
LEUKOCYTES UA: NEGATIVE
NITRITE: NEGATIVE
PH: 9 — AB (ref 5.0–8.0)
Protein, ur: NEGATIVE mg/dL
SPECIFIC GRAVITY, URINE: 1.011 (ref 1.005–1.030)
Squamous Epithelial / LPF: NONE SEEN

## 2017-02-09 LAB — COMPREHENSIVE METABOLIC PANEL
ALK PHOS: 98 U/L (ref 38–126)
ALT: 23 U/L (ref 14–54)
ALT: 23 U/L (ref 14–54)
ANION GAP: 7 (ref 5–15)
ANION GAP: 6 (ref 5–15)
AST: 56 U/L — ABNORMAL HIGH (ref 15–41)
AST: 53 U/L — ABNORMAL HIGH (ref 15–41)
Albumin: 2.6 g/dL — ABNORMAL LOW (ref 3.5–5.0)
Albumin: 2.9 g/dL — ABNORMAL LOW (ref 3.5–5.0)
Alkaline Phosphatase: 109 U/L (ref 38–126)
BUN: 9 mg/dL (ref 6–20)
BUN: 13 mg/dL (ref 6–20)
CALCIUM: 8.2 mg/dL — AB (ref 8.9–10.3)
CHLORIDE: 106 mmol/L (ref 101–111)
CO2: 24 mmol/L (ref 22–32)
CO2: 25 mmol/L (ref 22–32)
Calcium: 8 mg/dL — ABNORMAL LOW (ref 8.9–10.3)
Chloride: 106 mmol/L (ref 101–111)
Creatinine, Ser: 0.45 mg/dL (ref 0.44–1.00)
Creatinine, Ser: 0.38 mg/dL — ABNORMAL LOW (ref 0.44–1.00)
GFR calc non Af Amer: >60 mL/min (ref >60)
GFR calc non Af Amer: >60 mL/min (ref >60)
Glucose, Bld: 153 mg/dL — ABNORMAL HIGH (ref 65–99)
Glucose, Bld: 123 mg/dL — ABNORMAL HIGH (ref 65–99)
POTASSIUM: 3.9 mmol/L (ref 3.5–5.1)
POTASSIUM: 5.2 mmol/L — AB (ref 3.5–5.1)
SODIUM: 137 mmol/L (ref 135–145)
SODIUM: 137 mmol/L (ref 135–145)
Total Bilirubin: 1.3 mg/dL — ABNORMAL HIGH (ref 0.3–1.2)
Total Bilirubin: 1.4 mg/dL — ABNORMAL HIGH (ref 0.3–1.2)
Total Protein: 4.6 g/dL — ABNORMAL LOW (ref 6.5–8.1)
Total Protein: 5 g/dL — ABNORMAL LOW (ref 6.5–8.1)

## 2017-02-09 MED ORDER — ALBUMIN HUMAN 25 % IV SOLN
12.5000 g | Freq: Every day | INTRAVENOUS | Status: AC
  Administered 2017-02-09 – 2017-02-11 (×3): 12.5 g via INTRAVENOUS
  Filled 2017-02-09 (×3): qty 50

## 2017-02-09 MED ORDER — ALBUMIN HUMAN 25 % IV SOLN: 12.5000 g | Freq: Once | INTRAVENOUS | Status: DC

## 2017-02-09 MED ORDER — ZOLPIDEM TARTRATE 5 MG PO TABS
5.0000 mg | ORAL_TABLET | Freq: Every day | ORAL | Status: AC
  Administered 2017-02-09: 5 mg via ORAL
  Filled 2017-02-09: qty 1

## 2017-02-09 MED FILL — Copper Gluconate Tab 2 MG: ORAL | Qty: 1 | Status: AC

## 2017-02-09 NOTE — Progress Notes (Signed)
Patient ID: Sonya Park, female   DOB: 1941/01/02, 76 y.o.   MRN: [id number]   [facility] PROGRESS NOTE  Sonya Park [id number]:[id number] DOB: 1940/04/22 DOA: 01/14/2017 PCP: Cletis Athens, MD  HPI/Subjective:  Patient could recognize her husband, reporting unable to sleep, could not tell the location and name of the hospital.  answering most of the questions  According to the husband patient has chronic cognitive problems  Had a drop in BP on standing with PT. Hematuria is clearing after stopping Lovenox Objective: Vitals:   02/09/17 1419 02/09/17 2033  BP: 115/74 126/71  Pulse: 93 (!) 108  Resp: 16 16  Temp: 97.9 F (36.6 C) 98.4 F (36.9 C)  SpO2: 97% 96%    Filed Weights   02/07/17 0500 02/08/17 0500 02/09/17 0521  Weight: 64.4 kg (142 lb) 64.4 kg (142 lb) 66.2 kg (146 lb)    ROS: Review of Systems  Constitutional: Positive for malaise/fatigue. Negative for chills and fever.  HENT: Negative for congestion, ear pain and sore throat.   Respiratory: Negative for hemoptysis and shortness of breath.   Cardiovascular: Negative for chest pain, orthopnea and leg swelling.  Gastrointestinal: Negative for abdominal pain, blood in stool, diarrhea and nausea.  Genitourinary: Negative for frequency and urgency.  Musculoskeletal: Negative for joint pain and myalgias.  Skin: Negative for rash.  Neurological: Positive for weakness. Negative for tingling, tremors and focal weakness.  Psychiatric/Behavioral: The patient is not nervous/anxious.    Exam: Physical Exam  HENT:  Nose: No mucosal edema.  Mouth/Throat: No oropharyngeal exudate.  Eyes: Lids are normal. Pupils are equal, round, and reactive to light.  Icteric.   Neck: Carotid bruit is not present. No thyromegaly present.  Cardiovascular: Regular rhythm, S1 normal and S2 normal.  Respiratory: She has decreased breath sounds in the right lower field and the left lower field. She has no wheezes. She has no rhonchi. She has  no rales.  GI: Soft. Bowel sounds are normal. There is no tenderness.  Musculoskeletal:       Right elbow: She exhibits no swelling.       Left elbow: She exhibits no swelling.       Right wrist: She exhibits no swelling.       Left wrist: She exhibits no swelling.       Right knee: She exhibits no swelling.       Left knee: She exhibits no swelling.       Right ankle: She exhibits no swelling.  Lymphadenopathy:    She has no cervical adenopathy.  Neurological: She is alert.  Patient barely able to straight leg raise. Patient able to lift arms up off the bed  Skin:  Dressing in place  Psychiatric: She has a normal mood and affect.  On right great toe tip- 1 cm size dry gangrene, no redness present.    Data Reviewed: Basic Metabolic Panel: Recent Labs  Lab 02/04/17 0334 02/05/17 0411 02/06/17 0411 02/07/17 0350 02/08/17 0443 02/09/17 0849  NA 138 138  --  140 138 137  K 3.2* 3.4*  --  4.1 4.0 3.9  CL 106 106  --  108 105 106  CO2 26 29  --  _0 GLUCOSE 91 85  --  79 87 153*  BUN 19 16  --  _1 CREATININE 0.36* <0.30*  --  0.36* 0.36* 0.45  CALCIUM 7.9* 7.6*  --  7.6* 8.0* 8.0*  MG 1.7  --   --   --   --   --  PHOS  --   --  3.1  --   --   --    Liver Function Tests: Recent Labs  Lab 02/04/17 0334 02/05/17 0411 02/06/17 0411 02/07/17 0350 02/08/17 0443 02/09/17 0849  AST 59* 46*  --  42* 42* 56*  ALT 42 35  --  _0 ALKPHOS 109 101  --  85 91 109  BILITOT 1.7* 1.4*  --  1.4* 1.2 1.3*  PROT 4.4* 4.2*  --  4.1* 4.5* 4.6*  ALBUMIN 2.2* 2.2* 2.3* 2.3* 2.6* 2.6*   No results for input(s): AMMONIA in the last 168 hours. CBC: Recent Labs  Lab 02/04/17 0334 02/05/17 0411 02/06/17 0411 02/07/17 0350 02/08/17 0443  WBC 6.3 6.1 6.8 7.7 6.0  NEUTROABS  --   --  4.5  --   --   HGB 8.7* 8.8* 8.4* 9.0* 8.5*  HCT 27.3* 28.0* 26.2* 27.3* 26.8*  MCV 112.6* 112.7* 112.8* 113.0* 113.7*  PLT 111* 114* 131* 149* 149*    CBG: No results for  input(s): GLUCAP in the last 168 hours.  Recent Results (from the past 240 hour(s))  C difficile quick screen w PCR reflex     Status: None   Collection Time: 02/02/17 12:32 PM  Result Value Ref Range Status   C Diff antigen NEGATIVE NEGATIVE Final   C Diff toxin NEGATIVE NEGATIVE Final   C Diff interpretation No C. difficile detected.  Final     Studies: No results found.  Scheduled Meds: . acidophilus  2 capsule Oral TID  . bacitracin  1 application Topical BID  . chlorhexidine  15 mL Mouth Rinse BID  . cholecalciferol  2,000 Units Oral Daily  . collagenase   Topical Daily  . Copper Gluconate  1 tablet Oral Daily  . feeding supplement (ENSURE ENLIVE)  237 mL Oral TID BM  . ferrous sulfate  325 mg Oral BID WC  . folic acid  1 mg Oral Daily  . furosemide  20 mg Intravenous Daily  . loperamide  2 mg Oral Q6H  . modafinil  100 mg Oral Daily  . multivitamin  15 mL Oral Daily  . potassium chloride  40 mEq Oral BID  . rifaximin  550 mg Oral BID  . sodium chloride flush  10-40 mL Intracatheter Q12H  . thiamine  100 mg Oral Daily  . vitamin B-12  500 mcg Oral Daily  . vitamin C  500 mg Oral Daily  . zinc sulfate  220 mg Oral Daily  . zolpidem  5 mg Oral QHS   Continuous Infusions: . albumin human      Assessment/Plan: Patient is a 76 year old white female with alcoholic liver disease    1. Septic shock with Escherichia coli bacteremia.   was on meropenem due to persistently elevated WBC count,  discontinued vancomycin since her blood cultures have been negative, appreciate ID input Clinically improving- changed abx to cipro and flagyl and recommended to stop antibiotics on 02/03/17-antibiotics discontinued after completing the course  2. Alcoholic liver disease with acute liver failure: patient's liver function are stable, however clinically patient has evidence of ascites on her ultrasound, severe hypoalbuminemia with third spacing-providing IV albumin Improving slowly.      3. Acute respiratory failure with hypoxia.  Resolved patient extubated 01/22/2017. Currently on room air  # Dysphagia with sore throat and upper GI series shows no evidence of obstruction : patient's NG tube has been removed, and her oral intake is improving  daily      Dietician is following   4. Acute encephalopathy secondary to severe sepsis.  Clinically slow improvement continue Xifaxan and Provigil        5. Pancytopenia could be secondary to alcohol abuse and/or sepsis. Hematology consult appreciated.  Outpatient bone marrow biopsy to rule out myelodysplastic syndrome  #  Macrocytic anemia-secondary to alcohol abuse and history of gastric bypass and copper deficiency ; W38 and folic acid levels are normal.  LDH and reticulocyte count haptoglobin globin being normal not considering hemolysis ;follow with hematology as out pt. outpatient bone marrow biopsy to rule out myelodysplastic syndrome, follow-up with dietitian    #Severe malnutrition, Anasarca and third spacing of fluids.  Patient has been receiving IV albumin   #Jaundice likely secondary to shock. Could also be secondary to prior alcohol abuse. Improving    #Facial trauma at home. Fractured nasal bones. Hematoma and bruising around the eyelids.  #Insomnia-patient and her husband are agreeable with Ambien trial today   #Diarrhea has rectal tube Stool for C. difficile toxin is negative On Imodium, will start probiotics, completed antibiotic course  #Hematuria improving clinically after discontinuing Lovenox Will repeat urinalysis  Prognosis very poor: You need she needs aggressive management and would benefit from [place] working on approval. Consult palliative care .  Code Status: full.    Code Status Orders        Start     Ordered   01/15/17 0158  Full code  Continuous     01/15/17 0157    Code Status History    Date Active Date Inactive Code Status Order ID Comments User Context   12/13/2015 11:49  AM 12/15/2015  7:28 PM Full Code [id number]  Dereck Leep, MD Inpatient   09/17/2014  6:01 AM 09/18/2014  3:51 PM Full Code [id number]  Juluis Mire, MD Inpatient     Family Communication: spoke with husband at the bedside Disposition Plan: may need LTAC.  I have doen peer to peer review with LTAC and [place] health physician today. Consultants:  Antibiotics:  None now.  Time spent: 35 minutes  Aidian Salomon, [employer]

## 2017-02-09 NOTE — Progress Notes (Signed)
Patient ID: Sonya Park, female   DOB: 11/05/1940, 76 y.o.   MRN: [id number]   [facility] PROGRESS NOTE  Sonya Park [id number]:[id number] DOB: 20-Dec-1940 DOA: 01/14/2017 PCP: Cletis Athens, MD  HPI/Subjective: able to speak and oriented. Patient still has diarrhea and has a rectal tube in place and has Foley in place Patient had NG tube taken out, she did eat breakfast this morning Patient unable to do much with physical therapy  Still have significant swelling on her skin. Tried removing her Rectal tube , but continued to have liquid rectal output, so re-placed the tube. Started on imodium oral schedules.  Had a drop in BP on standing with PT. Hematuria noted yesterday, so lovenox stopped, now urine is clear.  Objective: Vitals:   02/08/17 1144 02/08/17 1950  BP: 116/71 130/70  Pulse: 97 (!) 106  Resp:  16  Temp: 98.4 F (36.9 C) 98 F (36.7 C)  SpO2: 96% 97%    Filed Weights   02/06/17 0500 02/07/17 0500 02/08/17 0500  Weight: 63.5 kg (140 lb) 64.4 kg (142 lb) 64.4 kg (142 lb)    ROS: Review of Systems  Constitutional: Positive for malaise/fatigue. Negative for chills and fever.  HENT: Negative for congestion, ear pain and sore throat.   Respiratory: Negative for hemoptysis and shortness of breath.   Cardiovascular: Negative for chest pain, orthopnea and leg swelling.  Gastrointestinal: Negative for abdominal pain, blood in stool, diarrhea and nausea.  Genitourinary: Negative for frequency and urgency.  Musculoskeletal: Negative for joint pain and myalgias.  Skin: Negative for rash.  Neurological: Positive for weakness. Negative for tingling, tremors and focal weakness.  Psychiatric/Behavioral: The patient is not nervous/anxious.    Exam: Physical Exam  HENT:  Nose: No mucosal edema.  Mouth/Throat: No oropharyngeal exudate.  Eyes: Pupils are equal, round, and reactive to light. Lids are normal.  Icteric.   Neck: Carotid bruit is not present. No thyromegaly  present.  Cardiovascular: Regular rhythm, S1 normal and S2 normal.   Respiratory: She has decreased breath sounds in the right lower field and the left lower field. She has no wheezes. She has no rhonchi. She has no rales.  GI: Soft. Bowel sounds are normal. There is no tenderness.  Musculoskeletal:       Right elbow: She exhibits no swelling.       Left elbow: She exhibits no swelling.       Right wrist: She exhibits no swelling.       Left wrist: She exhibits no swelling.       Right knee: She exhibits no swelling.       Left knee: She exhibits no swelling.       Right ankle: She exhibits no swelling.  Lymphadenopathy:    She has no cervical adenopathy.  Neurological: She is alert.  Patient barely able to straight leg raise. Patient able to lift arms up off the bed  Skin:  Dressing in place  Psychiatric: She has a normal mood and affect.  On right great toe tip- 1 cm size dry gangrene, no redness present.    Data Reviewed: Basic Metabolic Panel:  Recent Labs Lab 02/02/17 0345 02/04/17 0334 02/05/17 0411 02/06/17 0411 02/07/17 0350 02/08/17 0443  NA 140 138 138  --  140 138  K 3.6 3.2* 3.4*  --  4.1 4.0  CL 107 106 106  --  108 105  CO2 29 26 29   --  26 27  GLUCOSE 86 91  85  --  79 87  BUN 20 19 16   --  13 11  CREATININE 0.30* 0.36* <0.30*  --  0.36* 0.36*  CALCIUM 7.6* 7.9* 7.6*  --  7.6* 8.0*  MG  --  1.7  --   --   --   --   PHOS  --   --   --  3.1  --   --    Liver Function Tests:  Recent Labs Lab 02/02/17 0345 02/04/17 0334 02/05/17 0411 02/06/17 0411 02/07/17 0350 02/08/17 0443  AST 70* 59* 46*  --  42* 42*  ALT 52 42 35  --  25 22  ALKPHOS 98 109 101  --  85 91  BILITOT 1.5* 1.7* 1.4*  --  1.4* 1.2  PROT 4.0* 4.4* 4.2*  --  4.1* 4.5*  ALBUMIN 2.2* 2.2* 2.2* 2.3* 2.3* 2.6*   No results for input(s): AMMONIA in the last 168 hours. CBC:  Recent Labs Lab 02/04/17 0334 02/05/17 0411 02/06/17 0411 02/07/17 0350 02/08/17 0443  WBC 6.3 6.1 6.8  7.7 6.0  NEUTROABS  --   --  4.5  --   --   HGB 8.7* 8.8* 8.4* 9.0* 8.5*  HCT 27.3* 28.0* 26.2* 27.3* 26.8*  MCV 112.6* 112.7* 112.8* 113.0* 113.7*  PLT 111* 114* 131* 149* 149*    CBG: No results for input(s): GLUCAP in the last 168 hours.  Recent Results (from the past 240 hour(s))  C difficile quick screen w PCR reflex     Status: None   Collection Time: 02/02/17 12:32 PM  Result Value Ref Range Status   C Diff antigen NEGATIVE NEGATIVE Final   C Diff toxin NEGATIVE NEGATIVE Final   C Diff interpretation No C. difficile detected.  Final     Studies: No results found.  Scheduled Meds: . acidophilus  2 capsule Oral TID  . bacitracin  1 application Topical BID  . chlorhexidine  15 mL Mouth Rinse BID  . cholecalciferol  2,000 Units Oral Daily  . collagenase   Topical Daily  . Copper Gluconate  1 tablet Oral Daily  . feeding supplement (ENSURE ENLIVE)  237 mL Oral TID BM  . ferrous sulfate  325 mg Oral BID WC  . folic acid  1 mg Oral Daily  . furosemide  20 mg Intravenous Daily  . loperamide  2 mg Oral Q6H  . modafinil  100 mg Oral Daily  . multivitamin  15 mL Oral Daily  . potassium chloride  40 mEq Oral BID  . rifaximin  550 mg Oral BID  . sodium chloride flush  10-40 mL Intracatheter Q12H  . thiamine  100 mg Oral Daily  . vitamin B-12  500 mcg Oral Daily  . vitamin C  500 mg Oral Daily  . zinc sulfate  220 mg Oral Daily   Continuous Infusions: . sodium chloride 0.9 % 250 mL with trace elements Cr-Cu-Mn-Se-Zn (MTE-5 Concentrate) 1 mL infusion 50 mL/hr at 02/08/17 1522    Assessment/Plan: Patient is a 76 year old white female with alcoholic liver disease    1. Septic shock with Escherichia coli bacteremia.  status post treatment with Cipro was on meropenem due to persistently elevated WBC count,  discontinue vancomycin since her blood cultures have been negative, appreciate ID input   As improving- changed again to cipro and flagyl and suggest stop on 02/03/17-  stopped ABx.  2. Alcoholic liver disease with acute liver failure: patient's liver function are stable, however clinically  patient has evidence of ascites on her ultrasound, severe hypoalbuminemia with third spacing-  given her albumin multiple doses ,        Improving slowly.     3. Acute respiratory failure with hypoxia. Patient extubated 01/22/2017. Currently on room air 4. Dysphasia with sore throat and upper GI series shows no evidence of obstruction : patient's NG tube has been removed, and her oral intake is improving daily      Dietician on the case. 5. Acute encephalopathy secondary to severe sepsis.  continue Xifaxan mental with some improvement continue Xifaxan and Provigil       Improved. 6. Pancytopenia could be secondary to alcohol abuse and/or sepsis. Hematology consult appreciated. 7.   Macrocytic anemia- B12 checked- high than normal. Need to follow with hematology as out pt.        Called consult.      Ordered peripheral smear review, could be due to low Copper. 8. Hypomagnesemia, hypocalcemia and hypokalemia. replaced 9. Severe malnutrition, Anasarca and third spacing of fluids.  Patient has been receiving IV albumin many dose. 10. Jaundice likely secondary to shock. Could also be secondary to prior alcohol abuse.jaundice improving   11. Facial trauma at home. Fractured nasal bones. Hematoma and bruising around the eyelids. 12. Anasarca continue low dose lasix 13. Prognosis very poor: patient still clinically ill, she needs aggressive management and would benefit from [place] working on approval. 14. Diarrhea- watery loose stool. Have rectal tube, checked C diff negative.        Now her diet is improving, and Abx are stopped- this may help. I also started on scheduled Imodium, will try again to remove rectal tube- failed once.     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   Rectal tube came out during PT today. 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 A sper dietician- for 5 days, recheck. 16. Hematuria- 02/07/17    Held lovenox, now cleared, may resume lovenox tomorrow, as she have poor functional status.  Code Status: full.    Code Status Orders        Start     Ordered   01/15/17 0158  Full code  Continuous     01/15/17 0157    Code Status History    Date Active Date Inactive Code Status Order ID Comments User Context   12/13/2015 11:49 AM 12/15/2015  7:28 PM Full Code [id number]  Dereck Leep, MD Inpatient   09/17/2014  6:01 AM 09/18/2014  3:51 PM Full Code [id number]  Juluis Mire, MD Inpatient     Family Communication: spoke with husband at the bedside Disposition Plan: may need LTAC.  I have doen peer to peer review with LTAC and [place] health physician today. Consultants:  Antibiotics:  None now.  Time spent: 35 minutes  Delonta Yohannes, [facility]

## 2017-02-10 LAB — BASIC METABOLIC PANEL
ANION GAP: 6 (ref 5–15)
BUN: 11 mg/dL (ref 6–20)
CHLORIDE: 107 mmol/L (ref 101–111)
CO2: 24 mmol/L (ref 22–32)
Calcium: 8.1 mg/dL — ABNORMAL LOW (ref 8.9–10.3)
GLUCOSE: 88 mg/dL (ref 65–99)
Potassium: 4.6 mmol/L (ref 3.5–5.1)
Sodium: 137 mmol/L (ref 135–145)

## 2017-02-10 LAB — CBC
HEMATOCRIT: 28 % — AB (ref 35.0–47.0)
Hemoglobin: 9.1 g/dL — ABNORMAL LOW (ref 12.0–16.0)
MCH: 36.2 pg — AB (ref 26.0–34.0)
MCHC: 32.5 g/dL (ref 32.0–36.0)
MCV: 111.3 fL — AB (ref 80.0–100.0)
Platelets: 159 10*3/uL (ref 150–440)
RBC: 2.51 MIL/uL — AB (ref 3.80–5.20)
RDW: 17 % — AB (ref 11.5–14.5)
WBC: 6.1 10*3/uL (ref 3.6–11.0)

## 2017-02-10 LAB — MAGNESIUM: Magnesium: 2.1 mg/dL (ref 1.7–2.4)

## 2017-02-10 MED ORDER — ZOLPIDEM TARTRATE 5 MG PO TABS
5.0000 mg | ORAL_TABLET | Freq: Every day | ORAL | Status: AC
  Administered 2017-02-10: 5 mg via ORAL
  Filled 2017-02-10: qty 1

## 2017-02-10 MED ORDER — ACETAMINOPHEN 325 MG PO TABS
650.0000 mg | ORAL_TABLET | Freq: Four times a day (QID) | ORAL | Status: DC | PRN
  Administered 2017-02-10: 650 mg via ORAL
  Filled 2017-02-10: qty 2

## 2017-02-10 MED ORDER — TRAMADOL HCL 50 MG PO TABS
50.0000 mg | ORAL_TABLET | Freq: Two times a day (BID) | ORAL | Status: DC | PRN
  Administered 2017-02-10 – 2017-02-11 (×2): 50 mg via ORAL
  Filled 2017-02-10 (×3): qty 1

## 2017-02-10 MED ORDER — OCUVITE-LUTEIN PO CAPS
1.0000 | ORAL_CAPSULE | Freq: Every day | ORAL | Status: DC
  Administered 2017-02-10 – 2017-02-11 (×2): 1 via ORAL
  Filled 2017-02-10 (×2): qty 1

## 2017-02-10 NOTE — Clinical Social Work Note (Signed)
CSW has provided patient's husband with another facility that has offered to take his wife: [facility]. Patient's husband is aware that [facility] choices are now: [facility], and [facility]. Shela Leff MSW,LCSW [phone]

## 2017-02-10 NOTE — Progress Notes (Signed)
New referral for out patient Palliative to follow at SNF received from [facility]. Facility and discharge date undetermined at this time. Bed offers have been made per chart note review. Patient information faxed to referral. Will shadow chart until discharge facility is known. Thank you. Flo Shanks RN, BSN, [facility], hospital Liaison [phone]

## 2017-02-10 NOTE — Progress Notes (Signed)
Nutrition Follow-up  DOCUMENTATION CODES:   Not applicable  INTERVENTION:  1. Ocuvite 2. Continue Ensure Enlive po TID, each supplement provides 350 kcal and 20 grams of protein 3. Continue Magic cup TID with meals, each supplement provides 290 kcal and 9 grams of protein  Folic acid 1mg  daily   Vitamin C 500mg  daily for wound healing   Thiamine 100mg  oral daily   Vitamin D 2000 units oral daily   Vitamin B-12 500 mcg oral daily   Recommend Copper gluconate tab daily- provides 2mg  copper  Recommend 16 mg elemental zinc PO for 6 months (200% RDI)  Recommend copper 1 mg PO for every 8-15mg  elemental zinc daily until zinc supplementation is complete   NUTRITION DIAGNOSIS:   Malnutrition(Moderate) related to chronic illness(hx Roux-en-Y gastric bypass 2012, anorexia following right BKA, EtOH abuse) as evidenced by moderate depletion of body fat, moderate depletions of muscle mass, moderate to severe fluid accumulation. 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 Intubated on 10/10.  Spoke with patient, husband at bedside. She seems to be more alert, responds appropriately to some questions, but husband complains she can't remember his birthday and other things. Per him, there is a family hx of alzheimer's. Patient ate an  omelet with ham and cheese, and potatoes for breakfast. Ate 20% per Husband, and drank an ensure. Weight down 19 pounds from admission. Patient complains of "stomach ache." Likely due to hx gastric bypass and decreased PO intake since hospital admission. Discussed with RN.  Disp: D/C to SNF when appropriate  Labs reviewed:  K 5.2,  Medications reviewed and include: 40K+ BID Albumin 12.5g gtt  Diet Order:  DIET SOFT Room service appropriate? Yes; Fluid consistency: Thin  EDUCATION NEEDS:   Education needs addressed  Skin:  Skin Assessment: Wound (see comment)(Stg I buttocks, weeping to arms and legs, cellulitis lower legs, blisters arms and legs, skin tear left leg)  Last BM:  10/30- type 7- 558ml via rectal pouch   Height:   Ht Readings from Last 1 Encounters:  01/14/17 5\' 6"  (1.676 m)    Weight:   Wt Readings from Last 1 Encounters:  02/10/17 137 lb (62.1 kg)    Ideal Body Weight:  59.1 kg  BMI:  Body mass index is 22.11 kg/m.  Estimated Nutritional Needs:   Kcal:  1475-1700 (MSJ x 1.3-1.5)  Protein:  85-100 grams (1.4-1.6 grams/kg)  Fluid:  1.5-1.8 L/day (25-30 ml/kg)    Satira Anis. Stacie Knutzen, MS, RD LDN Inpatient Clinical Dietitian Pager [phone]

## 2017-02-10 NOTE — Care Management (Signed)
Confirmation from [facility] that peer to peer review still resulted in denial. CSW notified.

## 2017-02-10 NOTE — Progress Notes (Signed)
Patient ID: Sonya Park, female   DOB: 1940/07/19, 76 y.o.   MRN: [id number]   [facility] PROGRESS NOTE  Sonya Park FHQ:[id number] DOB: 1941/01/23 DOA: 01/14/2017 PCP: Sonya Athens, MD  HPI/Subjective:  Patient could recognize her husband, reporting unable to sleep, could not tell the location and name of the hospital.  answering most of the questions  According to the husband patient has chronic cognitive problems Slept well last night, has chronic dementia, hematuria is clearing after stopping Lovenox Objective: Vitals:   02/10/17 0739 02/10/17 1202  BP: 118/80 101/63  Pulse: 93 (!) 103  Resp:    Temp: 98.1 F (36.7 C) 98 F (36.7 C)  SpO2: 97% 97%    Filed Weights   02/08/17 0500 02/09/17 0521 02/10/17 0436  Weight: 64.4 kg (142 lb) 66.2 kg (146 lb) 62.1 kg (137 lb)    ROS: Review of Systems  Constitutional: Positive for malaise/fatigue. Negative for chills and fever.  HENT: Negative for congestion, ear pain and sore throat.   Respiratory: Negative for hemoptysis and shortness of breath.   Cardiovascular: Negative for chest pain, orthopnea and leg swelling.  Gastrointestinal: Negative for abdominal pain, blood in stool, diarrhea and nausea.  Genitourinary: Negative for frequency and urgency.  Musculoskeletal: Negative for joint pain and myalgias.  Skin: Negative for rash.  Neurological: Positive for weakness. Negative for tingling, tremors and focal weakness.  Psychiatric/Behavioral: The patient is not nervous/anxious.    Exam: Physical Exam  HENT:  Nose: No mucosal edema.  Mouth/Throat: No oropharyngeal exudate.  Eyes: Lids are normal. Pupils are equal, round, and reactive to light.  Icteric.   Neck: Carotid bruit is not present. No thyromegaly present.  Cardiovascular: Regular rhythm, S1 normal and S2 normal.  Respiratory: She has decreased breath sounds in the right lower field and the left lower field. She has no wheezes. She has no rhonchi. She has  no rales.  GI: Soft. Bowel sounds are normal. There is no tenderness.  Musculoskeletal:       Right elbow: She exhibits no swelling.       Left elbow: She exhibits no swelling.       Right wrist: She exhibits no swelling.       Left wrist: She exhibits no swelling.       Right knee: She exhibits no swelling.       Left knee: She exhibits no swelling.       Right ankle: She exhibits no swelling.  Lymphadenopathy:    She has no cervical adenopathy.  Neurological: She is alert.  Patient barely able to straight leg raise. Patient able to lift arms up off the bed  Skin:  Dressing in place  Psychiatric: She has a normal mood and affect.  On right great toe tip- 1 cm size dry gangrene, no redness present.    Data Reviewed: Basic Metabolic Panel: Recent Labs  Lab 02/04/17 0334  02/06/17 0411 02/07/17 0350 02/08/17 0443 02/09/17 0849 02/09/17 2138 02/10/17 0501 02/10/17 1239  NA 138   < >  --  140 138 137 137  --  137  K 3.2*   < >  --  4.1 4.0 3.9 5.2*  --  4.6  CL 106   < >  --  108 105 106 106  --  107  CO2 26   < >  --  _0 --  24  GLUCOSE 91   < >  --  79  87 153* 123*  --  88  BUN 19   < >  --  _0 --  11  CREATININE 0.36*   < >  --  0.36* 0.36* 0.45 0.38*  --  <0.30*  CALCIUM 7.9*   < >  --  7.6* 8.0* 8.0* 8.2*  --  8.1*  MG 1.7  --   --   --   --   --   --  2.1  --   PHOS  --   --  3.1  --   --   --   --   --   --    < > = values in this interval not displayed.   Liver Function Tests: Recent Labs  Lab 02/05/17 0411 02/06/17 0411 02/07/17 0350 02/08/17 0443 02/09/17 0849 02/09/17 2138  AST 46*  --  42* 42* 56* 53*  ALT 35  --  _1 ALKPHOS 101  --  85 91 109 98  BILITOT 1.4*  --  1.4* 1.2 1.3* 1.4*  PROT 4.2*  --  4.1* 4.5* 4.6* 5.0*  ALBUMIN 2.2* 2.3* 2.3* 2.6* 2.6* 2.9*   No results for input(s): AMMONIA in the last 168 hours. CBC: Recent Labs  Lab 02/05/17 0411 02/06/17 0411 02/07/17 0350 02/08/17 0443 02/10/17 0501  WBC  6.1 6.8 7.7 6.0 6.1  NEUTROABS  --  4.5  --   --   --   HGB 8.8* 8.4* 9.0* 8.5* 9.1*  HCT 28.0* 26.2* 27.3* 26.8* 28.0*  MCV 112.7* 112.8* 113.0* 113.7* 111.3*  PLT 114* 131* 149* 149* 159    CBG: No results for input(s): GLUCAP in the last 168 hours.  Recent Results (from the past 240 hour(s))  C difficile quick screen w PCR reflex     Status: None   Collection Time: 02/02/17 12:32 PM  Result Value Ref Range Status   C Diff antigen NEGATIVE NEGATIVE Final   C Diff toxin NEGATIVE NEGATIVE Final   C Diff interpretation No C. difficile detected.  Final     Studies: No results found.  Scheduled Meds: . acidophilus  2 capsule Oral TID  . chlorhexidine  15 mL Mouth Rinse BID  . cholecalciferol  2,000 Units Oral Daily  . collagenase   Topical Daily  . feeding supplement (ENSURE ENLIVE)  237 mL Oral TID BM  . ferrous sulfate  325 mg Oral BID WC  . folic acid  1 mg Oral Daily  . furosemide  20 mg Intravenous Daily  . loperamide  2 mg Oral Q6H  . modafinil  100 mg Oral Daily  . multivitamin-lutein  1 capsule Oral Daily  . potassium chloride  40 mEq Oral BID  . rifaximin  550 mg Oral BID  . thiamine  100 mg Oral Daily  . vitamin B-12  500 mcg Oral Daily  . vitamin C  500 mg Oral Daily  . zinc sulfate  220 mg Oral Daily   Continuous Infusions: . albumin human      Assessment/Plan: Patient is a 76 year old white female with alcoholic liver disease    1. Septic shock with Escherichia coli bacteremia.   was on meropenem due to persistently elevated WBC count,  discontinued vancomycin since her blood cultures have been negative, appreciate ID input Clinically improving- changed abx to cipro and flagyl and recommended to stop antibiotics on 02/03/17-antibiotics discontinued after completing the course  2. Alcoholic liver disease with acute liver failure:  patient's liver function are stable, however clinically patient has evidence of ascites on her ultrasound, severe  hypoalbuminemia with third spacing-providing IV albumin Improving slowly.     3. Acute respiratory failure with hypoxia.  Resolved patient extubated 01/22/2017. Currently on room air  # Dysphagia with sore throat and upper GI series shows no evidence of obstruction : patient's NG tube has been removed, and her oral intake is improving daily      Dietician is following   4. Acute encephalopathy secondary to severe sepsis.  Clinically slow improvement continue Xifaxan and Provigil        5. Pancytopenia could be secondary to alcohol abuse and/or sepsis. Hematology consult appreciated.  Outpatient bone marrow biopsy to rule out myelodysplastic syndrome  #  Macrocytic anemia-secondary to alcohol abuse and history of gastric bypass and copper deficiency ; N02 and folic acid levels are normal.  LDH and reticulocyte count haptoglobin globin being normal not considering hemolysis ;follow with hematology as out pt. outpatient bone marrow biopsy to rule out myelodysplastic syndrome, follow-up with dietitian P.o. electrolyte replacement    #Severe malnutrition, Anasarca and third spacing of fluids.  Patient has been receiving IV albumin.  Will provide protein supplements  #Jaundice likely secondary to shock. Could also be secondary to prior alcohol abuse. Improving    #Facial trauma at home. Fractured nasal bones. Hematoma and bruising around the eyelids.  #Insomnia-patient and her husband are agreeable with Ambien trial today   #Diarrhea -clinically improving Rectal tube discontinued Stool is formed now according to the nurses report Stool for C. difficile toxin is negative On Imodium, will start probiotics, completed antibiotic course  #Hematuria improving clinically after discontinuing Lovenox Repeat urinalysis with small hemoglobin on dipstick.    Prognosis very poor:  Patient will be benefited with skilled nursing facility at this time follow-up with social worker and case manager. SCDs  for DVT prophylaxis  Code Status: full.    Code Status Orders        Start     Ordered   01/15/17 0158  Full code  Continuous     01/15/17 0157    Code Status History    Date Active Date Inactive Code Status Order ID Comments User Context   12/13/2015 11:49 AM 12/15/2015  7:28 PM Full Code [id number]  Dereck Leep, MD Inpatient   09/17/2014  6:01 AM 09/18/2014  3:51 PM Full Code [id number]  Juluis Mire, MD Inpatient     Family Communication: spoke with husband at the bedside Disposition Plan: snf  Hospitalist Dr. Cheron Every did peer to peer review with [facility] and [place] health physician today. Consultants:  Antibiotics:  None now.  Time spent: 35 minutes  Jolane Bankhead, [employer]

## 2017-02-11 LAB — COMPREHENSIVE METABOLIC PANEL
ALT: 23 U/L (ref 14–54)
ANION GAP: 6 (ref 5–15)
AST: 44 U/L — ABNORMAL HIGH (ref 15–41)
Albumin: 2.8 g/dL — ABNORMAL LOW (ref 3.5–5.0)
Alkaline Phosphatase: 103 U/L (ref 38–126)
BUN: 9 mg/dL (ref 6–20)
CHLORIDE: 105 mmol/L (ref 101–111)
CO2: 25 mmol/L (ref 22–32)
Calcium: 8 mg/dL — ABNORMAL LOW (ref 8.9–10.3)
Creatinine, Ser: <0.3 mg/dL — ABNORMAL LOW (ref 0.44–1.00)
Glucose, Bld: 78 mg/dL (ref 65–99)
Potassium: 4.2 mmol/L (ref 3.5–5.1)
SODIUM: 136 mmol/L (ref 135–145)
Total Bilirubin: 1.5 mg/dL — ABNORMAL HIGH (ref 0.3–1.2)
Total Protein: 4.9 g/dL — ABNORMAL LOW (ref 6.5–8.1)

## 2017-02-11 LAB — CBC
HCT: 29.1 % — ABNORMAL LOW (ref 35.0–47.0)
HEMOGLOBIN: 9.4 g/dL — AB (ref 12.0–16.0)
MCH: 35.6 pg — AB (ref 26.0–34.0)
MCHC: 32.3 g/dL (ref 32.0–36.0)
MCV: 110 fL — AB (ref 80.0–100.0)
Platelets: 155 10*3/uL (ref 150–440)
RBC: 2.65 MIL/uL — AB (ref 3.80–5.20)
RDW: 16.5 % — ABNORMAL HIGH (ref 11.5–14.5)
WBC: 5.7 10*3/uL (ref 3.6–11.0)

## 2017-02-11 MED ORDER — MODAFINIL 100 MG PO TABS: 100.0000 mg | ORAL_TABLET | Freq: Every day | ORAL | Status: DC

## 2017-02-11 MED ORDER — THIAMINE HCL 100 MG PO TABS: 100.0000 mg | ORAL_TABLET | Freq: Every day | ORAL | Status: AC

## 2017-02-11 MED ORDER — LOPERAMIDE HCL 2 MG PO CAPS: 2.0000 mg | ORAL_CAPSULE | Freq: Four times a day (QID) | ORAL | 0 refills | Status: DC | PRN

## 2017-02-11 MED ORDER — ENSURE ENLIVE PO LIQD: 237.0000 mL | Freq: Three times a day (TID) | ORAL | 12 refills | Status: DC

## 2017-02-11 MED ORDER — POTASSIUM CHLORIDE 20 MEQ/15ML (10%) PO SOLN: 40.0000 meq | Freq: Two times a day (BID) | ORAL | 0 refills | Status: DC

## 2017-02-11 MED ORDER — RISAQUAD PO CAPS: 2.0000 | ORAL_CAPSULE | Freq: Three times a day (TID) | ORAL | Status: DC

## 2017-02-11 MED ORDER — COLLAGENASE 250 UNIT/GM EX OINT: TOPICAL_OINTMENT | Freq: Every day | CUTANEOUS | 0 refills | Status: DC

## 2017-02-11 MED ORDER — RIFAXIMIN 550 MG PO TABS: 550.0000 mg | ORAL_TABLET | Freq: Two times a day (BID) | ORAL | Status: DC

## 2017-02-11 MED ORDER — FUROSEMIDE 20 MG PO TABS: 20.0000 mg | ORAL_TABLET | Freq: Every day | ORAL | 11 refills | Status: DC

## 2017-02-11 MED ORDER — FOLIC ACID 1 MG PO TABS: 1.0000 mg | ORAL_TABLET | Freq: Every day | ORAL | Status: DC

## 2017-02-11 MED ORDER — DIPHENHYDRAMINE-ZINC ACETATE 2-0.1 % EX CREA: TOPICAL_CREAM | Freq: Three times a day (TID) | CUTANEOUS | 0 refills | Status: DC | PRN

## 2017-02-11 MED ORDER — ZINC SULFATE 220 (50 ZN) MG PO CAPS: 220.0000 mg | ORAL_CAPSULE | Freq: Every day | ORAL | Status: DC

## 2017-02-11 MED ORDER — TRAMADOL HCL 50 MG PO TABS: 50.0000 mg | ORAL_TABLET | Freq: Two times a day (BID) | ORAL | 0 refills | Status: DC | PRN

## 2017-02-11 MED ORDER — IPRATROPIUM-ALBUTEROL 0.5-2.5 (3) MG/3ML IN SOLN: 3.0000 mL | Freq: Four times a day (QID) | RESPIRATORY_TRACT | Status: DC | PRN

## 2017-02-11 MED ORDER — CYANOCOBALAMIN 500 MCG PO TABS: 500.0000 ug | ORAL_TABLET | Freq: Every day | ORAL | Status: DC

## 2017-02-11 MED ORDER — OCUVITE-LUTEIN PO CAPS
1.0000 | ORAL_CAPSULE | Freq: Every day | ORAL | 0 refills | Status: DC
Start: 2017-02-12 — End: 2023-03-14

## 2017-02-11 MED ORDER — LORAZEPAM 0.5 MG PO TABS: 0.5000 mg | ORAL_TABLET | Freq: Every evening | ORAL | 0 refills | Status: DC | PRN

## 2017-02-11 MED ORDER — FERROUS SULFATE 325 (65 FE) MG PO TABS: 325.0000 mg | ORAL_TABLET | Freq: Two times a day (BID) | ORAL | 3 refills | Status: DC

## 2017-02-11 MED ORDER — ACETAMINOPHEN 325 MG PO TABS: 650.0000 mg | ORAL_TABLET | Freq: Four times a day (QID) | ORAL | Status: DC | PRN

## 2017-02-11 MED ORDER — ASCORBIC ACID 500 MG PO TABS: 500.0000 mg | ORAL_TABLET | Freq: Every day | ORAL | Status: DC

## 2017-02-11 NOTE — Discharge Instructions (Signed)
Continue physical therapy Continue dietary supplements Follow-up with primary care physician at the facility in 3-5 days, PCP to consider repeating LFTs Follow-up with neurologist in 3-4 weeks regarding worsening of dementia Follow-up with oncology in 2 weeks Follow-up with general surgery in 2 weeks

## 2017-02-11 NOTE — Progress Notes (Signed)
Patient discharged to [facility] today. Was to have Palliative to follow per [facility] team, [place] aware.  Discharge summary faxed to referral. Thank you. Flo Shanks RN, BSN, [facility] [place], hospital Liaison [phone] c

## 2017-02-11 NOTE — Progress Notes (Signed)
Called report to Anderson Malta, Therapist, sports at [facility].

## 2017-02-11 NOTE — Care Management Important Message (Signed)
Important Message  Patient Details  Name: Sonya Park MRN: [id number] Date of Birth: 1940/07/04   Medicare Important Message Given:  Yes    Beverly Sessions, RN 02/11/2017, 10:14 AM

## 2017-02-11 NOTE — Care Management (Signed)
Patient discharging to SNF.  CSW facilitating.  RNCM signing off.

## 2017-02-11 NOTE — Progress Notes (Signed)
Nutrition Brief Note   Spoke to Dietitian at [facility], notified her of pt's nutrient deficiencies that are being treated recommended recheck vitamin A, vitamin K, copper, and zinc labs in 4 weeks. Pt may need IV supplementation if unable to absorb nutrients via enteral route.    Recommend:   Probiotics daily   Ensure Enlive po TID, each supplement provides 350 kcal and 20 grams of protein  Ocuvite daily- provides 1000 IU vitamin A, 200mg  vitamin C, 60 IU vitamin E, zinc 40mg , selenium 55 mcg, copper 2mg .   Vitamin C 500mg  daily for wound healing   Thiamine 100mg  oral daily   Vitamin D 2000 units oral daily   Vitamin B-12 500 mcg oral daily   Koleen Distance MS, RD, LDN Pager #[phone] After Hours Pager: [phone]

## 2017-02-11 NOTE — Discharge Summary (Signed)
[facility]    MR#:  [id number]  DATE OF BIRTH:  06-Nov-1940  DATE OF ADMISSION:  01/14/2017 ADMITTING PHYSICIAN: Lance Coon, MD  DATE OF DISCHARGE:  02/11/17  PRIMARY CARE PHYSICIAN: Cletis Athens, MD    ADMISSION DIAGNOSIS:  Hyperbilirubinemia [E80.6] Closed fracture of nasal bone, initial encounter [S02.2XXA] Fall, initial encounter [id number] Traumatic rhabdomyolysis, initial encounter ([place]) [T79.6XXA] Sepsis, due to unspecified organism [facility]) [A41.9] Leukopenia, unspecified type [D72.819] Cellulitis, unspecified cellulitis site [L03.90] Sepsis ([place]) [A41.9]  DISCHARGE DIAGNOSIS:  Principal Problem:   Sepsis ([place]) Active Problems:   Facial trauma   Osteoporosis   Neutropenia ([place])   Alcohol abuse   Acute respiratory failure ([place])   Bacteremia   Palliative care by specialist   Goals of care, counseling/discussion   Pressure injury of skin   Hyperbilirubinemia   Alcoholic liver failure (HCC)   Thrombocytopenia ([place])   Anasarca   LFT elevation   Severe malnutrition ([place])   Adult failure to thrive   Dysphagia   Itching   Advance care planning   Macrocytic anemia   Anemia, macrocytic, nutritional   SECONDARY DIAGNOSIS:   Past Medical History:  Diagnosis Date  . Arthritis   . Back pain   . Insomnia   . Medical history non-contributory   . Osteoporosis     HOSPITAL COURSE:    HPI :Sonya Park  is a 76 y.o. female who presents with A fall and significant facial trauma, and found here to meet sepsis criteria. Patient is unable to contribute much information to her history of present illness, despite the fact that she is able to converse. History is given mostly by her husband who is at bedside. He states that he found the patient after she had fallen down outside steps at their house. He states that they're currently sleeping in an RV as they await repair of mold in their house.  The patient had gotten up from the RV to go use the bathroom in the house, and fell at some point. Husband found her sometime later unconscious and face down and bleeding significantly. Husband states that she has been having some waxing and waning weakness for the past month or so. Patient states that she is not sure if she's had any overt urinary symptoms. Afterwards outside the room incompetence the husband stated that she does also drink wine fairly extensively. He states that she oftentimes starts drinking around lunch time and will have anywhere between 3-5 equivalent glasses of wine each day. CT maxillofacial and had here show no overt fractures, but suspected microfracture as she does have some hemorrhage into facial sinus. She is found to be mildly neutropenia and here with an ANC of 1300, white count 1.9, tachycardic. Source for sepsis is not clearly elucidated in the ED workup, but her lactic acid was 6.7 and her blood pressure became hypotensive in the ED. She was started on antibiotics and fluid resuscitation per sepsis protocol and hospitalists were called for admission   1. Septic shock with Escherichia coli bacteremia.   was on meropenem due to persistently elevated WBC count,  discontinued vancomycin since her blood cultures have been negative, appreciate ID input Clinically improving- changed abx to cipro and flagyl and recommended to stop antibiotics on 02/03/17-antibiotics discontinued after completing the course  2. 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     3. Acute respiratory failure with hypoxia.  Resolved patient extubated 01/22/2017. Currently on room air  # Dysphagia with sore throat and upper GI series shows no evidence of obstruction : patient's NG tube has been removed, and  her oral intake is improving daily      Dietician followed during the hospital course  # Acute encephalopathy secondary to severe sepsis.  Clinically slow improvement continue Xifaxan and Provigil       # Pancytopenia could be secondary to alcohol abuse and/or sepsis. Hematology consult appreciated.  Outpatient bone marrow biopsy to rule out myelodysplastic syndrome  #  Macrocytic anemia-secondary to alcohol abuse and history of gastric bypass and copper deficiency ; W65 and folic acid levels are normal.  LDH and reticulocyte count haptoglobin globin being normal not considering hemolysis ;follow with hematology as out pt. outpatient bone marrow biopsy to rule out myelodysplastic syndrome, follow-up with dietitian P.o. electrolyte replacement    #Severe malnutrition, Anasarca and third spacing of fluids.  Patient has been receiving IV albumin.  Will provide protein supplements  #Jaundice likely secondary to shock. Could also be secondary to prior alcohol abuse. Improving, monitor LFTs, PCP to consider repeating them in 1 week    #Facial trauma at home. Fractured nasal bones. Hematoma and bruising around the eyelids.  #Insomnia-patient and her husband are agreeable with Ambien 10 mg   #Diarrhea -clinically improved Rectal tube discontinued Stool is formed now according to the nurses report Stool for C. difficile toxin is negative  probiotics, completed antibiotic course  #Hematuria improving clinically after discontinuing Lovenox Repeat urinalysis with small hemoglobin on dipstick.  #Right lower extremity wound-continue dressing changes on a daily basis follow-up with wound care   Prognosis very poor:  Patient will be benefited with skilled nursing facility at this time follow-up with social worker and case manager. SCDs for DVT prophylaxis  Code Status: full. DISCHARGE CONDITIONS:   fair  CONSULTS OBTAINED:  Treatment Team:  Dustin Flock, MD Cammie Sickle,  MD Schnier, Dolores Lory, MD   PROCEDURES    DRUG ALLERGIES:   Allergies  Allergen Reactions  . Other Itching    "mycin" doesn't know which one  . Penicillins Swelling    Has patient had a PCN reaction causing immediate rash, facial/tongue/throat swelling, SOB or lightheadedness with hypotension: Yes Has patient had a PCN reaction causing severe rash involving mucus membranes or skin necrosis: No Has patient had a PCN reaction that required hospitalization: No Has patient had a PCN reaction occurring within the last 10 years: No If all of the above answers are "NO", then may proceed with Cephalosporin use.    DISCHARGE MEDICATIONS:   Current Discharge Medication List    START taking these medications   Details  acetaminophen (TYLENOL) 325 MG tablet Take 2 tablets (650 mg total) every 6 (six) hours as needed by mouth for moderate pain (headache).    acidophilus (RISAQUAD) CAPS capsule Take 2 capsules 3 (three) times daily by mouth.    collagenase (SANTYL) ointment Apply daily topically. Qty: 15 g, Refills: 0    diphenhydrAMINE-zinc acetate (BENADRYL) cream Apply 3 (three) times daily as needed topically for itching. Qty: 28.4 g, Refills: 0    feeding supplement, ENSURE ENLIVE, (ENSURE ENLIVE) LIQD Take 237 mLs 3 (three) times daily between meals by mouth. Qty: 237 mL, Refills: 12    ferrous sulfate 325 (65 FE) MG tablet Take 1 tablet (325  mg total) 2 (two) times daily with a meal by mouth. Refills: 3    folic acid (FOLVITE) 1 MG tablet Take 1 tablet (1 mg total) daily by mouth.    furosemide (LASIX) 20 MG tablet Take 1 tablet (20 mg total) daily by mouth. Qty: 30 tablet, Refills: 11    ipratropium-albuterol (DUONEB) 0.5-2.5 (3) MG/3ML SOLN Take 3 mLs every 6 (six) hours as needed by nebulization. Qty: 360 mL    loperamide (IMODIUM) 2 MG capsule Take 1 capsule (2 mg total) 4 (four) times daily as needed by mouth for diarrhea or loose stools. Qty: 30 capsule, Refills: 0     LORazepam (ATIVAN) 0.5 MG tablet Take 1 tablet (0.5 mg total) at bedtime as needed by mouth for sleep. Qty: 15 tablet, Refills: 0    modafinil (PROVIGIL) 100 MG tablet Take 1 tablet (100 mg total) daily by mouth.    multivitamin-lutein (OCUVITE-LUTEIN) CAPS capsule Take 1 capsule daily by mouth. Refills: 0    potassium chloride 20 MEQ/15ML (10%) SOLN Take 30 mLs (40 mEq total) 2 (two) times daily by mouth. Qty: 450 mL, Refills: 0    rifaximin (XIFAXAN) 550 MG TABS tablet Take 1 tablet (550 mg total) 2 (two) times daily by mouth. Qty: 42 tablet    thiamine 100 MG tablet Take 1 tablet (100 mg total) daily by mouth.    vitamin B-12 500 MCG tablet Take 1 tablet (500 mcg total) daily by mouth.    vitamin C (VITAMIN C) 500 MG tablet Take 1 tablet (500 mg total) daily by mouth.    zinc sulfate 220 (50 Zn) MG capsule Take 1 capsule (220 mg total) daily by mouth.      CONTINUE these medications which have CHANGED   Details  traMADol (ULTRAM) 50 MG tablet Take 1 tablet (50 mg total) every 12 (twelve) hours as needed by mouth for moderate pain. Qty: 30 tablet, Refills: 0      CONTINUE these medications which have NOT CHANGED   Details  aspirin 81 MG tablet Take 81 mg by mouth daily.    CALCIUM CARBONATE PO Take 1 tablet by mouth daily.     cholecalciferol (VITAMIN D) 1000 UNITS tablet Take 2,000 Units by mouth daily.     estrogens, conjugated, (PREMARIN) 0.625 MG tablet Take 0.625 mg by mouth daily. Take daily for 21 days then do not take for 7 days.    ibandronate (BONIVA) 150 MG tablet Take 150 mg by mouth every 30 (thirty) days. Take in the morning with a full glass of water, on an empty stomach, and do not take anything else by mouth or lie down for the next 30 min.      STOP taking these medications     megestrol (MEGACE) 20 MG tablet      Probiotic Product (DIGESTIVE ADVANTAGE GUMMIES) CHEW      clindamycin (CLEOCIN) 150 MG capsule      DiphenhydrAMINE HCl (ZZZQUIL)  50 MG/30ML LIQD      oxyCODONE (OXY IR/ROXICODONE) 5 MG immediate release tablet          DISCHARGE INSTRUCTIONS:   Continue physical therapy Continue dietary supplements Follow-up with primary care physician at the facility in 3-5 days, PCP to consider repeating LFTs Follow-up with neurologist in 3-4 weeks regarding worsening of dementia Follow-up with oncology in 2 weeks Follow-up with general surgery in 2 weeks DIET:  Soft diet with thin liquids and dietary supplements  DISCHARGE CONDITION:  Fair  ACTIVITY:  Activity as tolerated per pt  OXYGEN:  Home Oxygen: No.   Oxygen Delivery: room air  DISCHARGE LOCATION:  nursing home   If you experience worsening of your admission symptoms, develop shortness of breath, life threatening emergency, suicidal or homicidal thoughts you must seek medical attention immediately by calling 911 or calling your MD immediately  if symptoms less severe.  You Must read complete instructions/literature along with all the possible adverse reactions/side effects for all the Medicines you take and that have been prescribed to you. Take any new Medicines after you have completely understood and accpet all the possible adverse reactions/side effects.   Please note  You were cared for by a hospitalist during your hospital stay. If you have any questions about your discharge medications or the care you received while you were in the hospital after you are discharged, you can call the unit and asked to speak with the hospitalist on call if the hospitalist that took care of you is not available. Once you are discharged, your primary care physician will handle any further medical issues. Please note that NO REFILLS for any discharge medications will be authorized once you are discharged, as it is imperative that you return to your primary care physician (or establish a relationship with a primary care physician if you do not have one) for your aftercare  needs so that they can reassess your need for medications and monitor your lab values.     Today  Chief Complaint  Patient presents with  . Fall   Patient is doing much better.  Has baseline dementia according to the husband, but answers few questions appropriately requesting to increase the Ambien dose.  Agreeable to go to [facility] home  ROS:  CONSTITUTIONAL: Denies fevers, chills.  RESPIRATORY: Denies cough, wheeze, shortness of breath.  CARDIOVASCULAR: Denies chest pain, palpitations, edema.  GASTROINTESTINAL: Denies nausea, vomiting, diarrhea, abdominal pain. Denies bright red blood per rectum. GENITOURINARY: Denies dysuria, hematuria. HEMATOLOGIC AND LYMPHATIC: Has bruises on the face. SKIN: Left lower extremity superficial skin lesions MUSCULOSKELETAL: Denies pain in neck, back, shoulder, knees, hips or arthritic symptoms.  NEUROLOGIC: Denies paralysis, paresthesias.    VITAL SIGNS:  Blood pressure 133/79, pulse 98, temperature 97.6 F (36.4 C), temperature source Oral, resp. rate 20, height 5' 6" (1.676 m), weight 66.7 kg (147 lb), SpO2 98 %.  I/O:    Intake/Output Summary (Last 24 hours) at 02/11/2017 1157 Last data filed at 02/11/2017 1021 Gross per 24 hour  Intake 240 ml  Output 2301 ml  Net -2061 ml    PHYSICAL EXAMINATION:  GENERAL:  76 y.o.-year-old patient lying in the bed with no acute distress.  EYES: Pupils equal, round, reactive to light and accommodation. No scleral icterus. Extraocular muscles intact.  HEENT: Head atraumatic, normocephalic. Oropharynx and nasopharynx clear.  NECK:  Supple, no jugular venous distention. No thyroid enlargement, no tenderness.  LUNGS: Normal breath sounds bilaterally, no wheezing, rales,rhonchi or crepitation. No use of accessory muscles of respiration.  CARDIOVASCULAR: S1, S2 normal. No murmurs, rubs, or gallops.  ABDOMEN: Soft, non-tender, non-distended. Bowel sounds present. No organomegaly or mass.   EXTREMITIES: Left lower extremity with superficial skin lesions , continue wound care  no pedal edema, cyanosis, or clubbing.  NEUROLOGIC: Muscle strength diffusely weak, awake and alert, oriented to person, disoriented to place and time sensation intact. Gait not checked.  PSYCHIATRIC: The patient is alert and oriented x 1-2  SKIN: No obvious rash  DATA REVIEW:  CBC Recent Labs  Lab 02/11/17 0826  WBC 5.7  HGB 9.4*  HCT 29.1*  PLT 155    Chemistries  Recent Labs  Lab 02/10/17 0501  02/11/17 0826  NA  --    < > 136  K  --    < > 4.2  CL  --    < > 105  CO2  --    < > 25  GLUCOSE  --    < > 78  BUN  --    < > 9  CREATININE  --    < > <0.30*  CALCIUM  --    < > 8.0*  MG 2.1  --   --   AST  --   --  44*  ALT  --   --  23  ALKPHOS  --   --  103  BILITOT  --   --  1.5*   < > = values in this interval not displayed.    Cardiac Enzymes No results for input(s): TROPONINI in the last 168 hours.  Microbiology Results  Results for orders placed or performed during the hospital encounter of 01/14/17  Blood Culture (routine x 2)     Status: Abnormal   Collection Time: 01/14/17 11:08 PM  Result Value Ref Range Status   Specimen Description BLOOD LFOA  Final   Special Requests   Final    BOTTLES DRAWN AEROBIC AND ANAEROBIC Blood Culture results may not be optimal due to an excessive volume of blood received in culture bottles   Culture  Setup Time   Final    GRAM NEGATIVE RODS IN BOTH AEROBIC AND ANAEROBIC BOTTLES CRITICAL RESULT CALLED TO, READ BACK BY AND VERIFIED WITH:  MICHAEL SIMPSON AT 1207 01/15/17 SDR    Culture ESCHERICHIA COLI (A)  Final   Report Status 01/17/2017 FINAL  Final   Organism ID, Bacteria ESCHERICHIA COLI  Final      Susceptibility   Escherichia coli - MIC*    AMPICILLIN >=32 RESISTANT Resistant     CEFAZOLIN <=4 SENSITIVE Sensitive     CEFEPIME <=1 SENSITIVE Sensitive     CEFTAZIDIME <=1 SENSITIVE Sensitive     CEFTRIAXONE <=1 SENSITIVE  Sensitive     CIPROFLOXACIN 1 SENSITIVE Sensitive     GENTAMICIN <=1 SENSITIVE Sensitive     IMIPENEM <=0.25 SENSITIVE Sensitive     TRIMETH/SULFA >=320 RESISTANT Resistant     AMPICILLIN/SULBACTAM >=32 RESISTANT Resistant     PIP/TAZO <=4 SENSITIVE Sensitive     Extended ESBL NEGATIVE Sensitive     * ESCHERICHIA COLI  Blood Culture (routine x 2)     Status: Abnormal   Collection Time: 01/14/17 11:08 PM  Result Value Ref Range Status   Specimen Description BLOOD LAC  Final   Special Requests   Final    BOTTLES DRAWN AEROBIC AND ANAEROBIC Blood Culture results may not be optimal due to an excessive volume of blood received in culture bottles   Culture  Setup Time   Final    GRAM NEGATIVE RODS IN BOTH AEROBIC AND ANAEROBIC BOTTLES CRITICAL VALUE NOTED.  VALUE IS CONSISTENT WITH PREVIOUSLY REPORTED AND CALLED VALUE.    Culture (A)  Final    ESCHERICHIA COLI SUSCEPTIBILITIES PERFORMED ON PREVIOUS CULTURE WITHIN THE LAST 5 DAYS. Performed at [facility], [place] [address], [place], [place] [address]    Report Status 01/17/2017 FINAL  Final  Urine culture     Status: Abnormal  Collection Time: 01/14/17 11:08 PM  Result Value Ref Range Status   Specimen Description URINE, RANDOM  Final   Special Requests NONE  Final   Culture (A)  Final    <10,000 COLONIES/mL INSIGNIFICANT GROWTH Performed at [facility], [place] [address] [place], [place] [address]    Report Status 01/16/2017 FINAL  Final  Blood Culture ID Panel (Reflexed)     Status: Abnormal   Collection Time: 01/14/17 11:08 PM  Result Value Ref Range Status   Enterococcus species NOT DETECTED NOT DETECTED Final   Listeria monocytogenes NOT DETECTED NOT DETECTED Final   Staphylococcus species NOT DETECTED NOT DETECTED Final   Staphylococcus aureus NOT DETECTED NOT DETECTED Final   Streptococcus species NOT DETECTED NOT DETECTED Final   Streptococcus agalactiae NOT DETECTED NOT DETECTED Final   Streptococcus  pneumoniae NOT DETECTED NOT DETECTED Final   Streptococcus pyogenes NOT DETECTED NOT DETECTED Final   Acinetobacter baumannii NOT DETECTED NOT DETECTED Final   Enterobacteriaceae species DETECTED (A) NOT DETECTED Final    Comment: Enterobacteriaceae represent a large family of gram-negative bacteria, not a single organism. CRITICAL RESULT CALLED TO, READ BACK BY AND VERIFIED WITH:  MICHAEL SIMPSON AT 1207 01/15/17 SDR    Enterobacter cloacae complex NOT DETECTED NOT DETECTED Final   Escherichia coli DETECTED (A) NOT DETECTED Final    Comment: CRITICAL RESULT CALLED TO, READ BACK BY AND VERIFIED WITH:  MICHAEL SIMPSON AT 1207 01/15/17 SDR    Klebsiella oxytoca NOT DETECTED NOT DETECTED Final   Klebsiella pneumoniae NOT DETECTED NOT DETECTED Final   Proteus species NOT DETECTED NOT DETECTED Final   Serratia marcescens NOT DETECTED NOT DETECTED Final   Carbapenem resistance NOT DETECTED NOT DETECTED Final   Haemophilus influenzae NOT DETECTED NOT DETECTED Final   Neisseria meningitidis NOT DETECTED NOT DETECTED Final   Pseudomonas aeruginosa NOT DETECTED NOT DETECTED Final   Candida albicans NOT DETECTED NOT DETECTED Final   Candida glabrata NOT DETECTED NOT DETECTED Final   Candida krusei NOT DETECTED NOT DETECTED Final   Candida parapsilosis NOT DETECTED NOT DETECTED Final   Candida tropicalis NOT DETECTED NOT DETECTED Final  MRSA PCR Screening     Status: None   Collection Time: 01/20/17 11:04 AM  Result Value Ref Range Status   MRSA by PCR NEGATIVE NEGATIVE Final    Comment:        The GeneXpert MRSA Assay (FDA approved for NASAL specimens only), is one component of a comprehensive MRSA colonization surveillance program. It is not intended to diagnose MRSA infection nor to guide or monitor treatment for MRSA infections.   CULTURE, BLOOD (ROUTINE X 2) w Reflex to ID Panel     Status: None   Collection Time: 01/26/17  4:08 PM  Result Value Ref Range Status   Specimen  Description BLOOD BLOOD LEFT WRIST  Final   Special Requests   Final    BOTTLES DRAWN AEROBIC AND ANAEROBIC Blood Culture adequate volume   Culture NO GROWTH 5 DAYS  Final   Report Status 01/31/2017 FINAL  Final  CULTURE, BLOOD (ROUTINE X 2) w Reflex to ID Panel     Status: None   Collection Time: 01/26/17  4:08 PM  Result Value Ref Range Status   Specimen Description BLOOD BLOOD RIGHT WRIST  Final   Special Requests   Final    BOTTLES DRAWN AEROBIC AND ANAEROBIC Blood Culture results may not be optimal due to an inadequate volume of blood received in culture  bottles   Culture NO GROWTH 5 DAYS  Final   Report Status 01/31/2017 FINAL  Final  Aerobic Culture (superficial specimen)     Status: None   Collection Time: 01/28/17  3:28 PM  Result Value Ref Range Status   Specimen Description WOUND  Final   Special Requests Normal  Final   Gram Stain   Final    MODERATE WBC PRESENT, PREDOMINANTLY PMN NO ORGANISMS SEEN    Culture   Final    NO GROWTH 3 DAYS Performed at [facility], [place] [address], [place], [place] [address]    Report Status 02/01/2017 FINAL  Final  C difficile quick screen w PCR reflex     Status: None   Collection Time: 02/02/17 12:32 PM  Result Value Ref Range Status   C Diff antigen NEGATIVE NEGATIVE Final   C Diff toxin NEGATIVE NEGATIVE Final   C Diff interpretation No C. difficile detected.  Final    RADIOLOGY:  No results found.  EKG:   Orders placed or performed during the hospital encounter of 01/14/17  . ED EKG  . ED EKG      Management plans discussed with the patient, family and they are in agreement.  CODE STATUS:     Code Status Orders  (From admission, onward)        Start     Ordered   01/15/17 0158  Full code  Continuous     01/15/17 0157    Code Status History    Date Active Date Inactive Code Status Order ID Comments User Context   12/13/2015 11:49 12/15/2015 19:28 Full Code [id number]  Dereck Leep, MD Inpatient    09/17/2014 06:01 09/18/2014 15:51 Full Code [id number]  Juluis Mire, MD Inpatient      TOTAL TIME TAKING CARE OF THIS PATIENT: 45  minutes.   Note: This dictation was prepared with Dragon dictation along with smaller phrase technology. Any transcriptional errors that result from this process are unintentional.   _0 @  on 02/11/2017 at 11:57 AM  Between 7am to 6pm - Pager - [phone]  After 6pm go to [email or web address] - password [email or web address] [facility]  Office  [phone]  CC: Primary care physician; Cletis Athens, MD

## 2017-02-11 NOTE — Progress Notes (Signed)
Called EMS for transport to [facility].

## 2017-02-26 ENCOUNTER — Encounter: Payer: Self-pay | Admitting: Internal Medicine

## 2017-02-26 ENCOUNTER — Inpatient Hospital Stay: Payer: Medicare Other | Attending: Internal Medicine | Admitting: Internal Medicine

## 2017-02-26 ENCOUNTER — Other Ambulatory Visit: Payer: Medicare Other

## 2017-02-26 VITALS — BP 119/73 | HR 87 | Temp 97.0°F | Resp 16 | Wt 133.6 lb

## 2017-02-26 DIAGNOSIS — K760 Fatty (change of) liver, not elsewhere classified: Secondary | ICD-10-CM | POA: Insufficient documentation

## 2017-02-26 DIAGNOSIS — G4700 Insomnia, unspecified: Secondary | ICD-10-CM | POA: Diagnosis not present

## 2017-02-26 DIAGNOSIS — D696 Thrombocytopenia, unspecified: Secondary | ICD-10-CM | POA: Diagnosis not present

## 2017-02-26 DIAGNOSIS — Z7982 Long term (current) use of aspirin: Secondary | ICD-10-CM | POA: Insufficient documentation

## 2017-02-26 DIAGNOSIS — L98499 Non-pressure chronic ulcer of skin of other sites with unspecified severity: Secondary | ICD-10-CM | POA: Insufficient documentation

## 2017-02-26 DIAGNOSIS — Z809 Family history of malignant neoplasm, unspecified: Secondary | ICD-10-CM | POA: Diagnosis not present

## 2017-02-26 DIAGNOSIS — M810 Age-related osteoporosis without current pathological fracture: Secondary | ICD-10-CM | POA: Diagnosis not present

## 2017-02-26 DIAGNOSIS — Z9181 History of falling: Secondary | ICD-10-CM

## 2017-02-26 DIAGNOSIS — F1020 Alcohol dependence, uncomplicated: Secondary | ICD-10-CM | POA: Diagnosis not present

## 2017-02-26 DIAGNOSIS — M549 Dorsalgia, unspecified: Secondary | ICD-10-CM | POA: Insufficient documentation

## 2017-02-26 DIAGNOSIS — E60 Dietary zinc deficiency: Secondary | ICD-10-CM | POA: Diagnosis not present

## 2017-02-26 DIAGNOSIS — D539 Nutritional anemia, unspecified: Secondary | ICD-10-CM | POA: Insufficient documentation

## 2017-02-26 DIAGNOSIS — E860 Dehydration: Secondary | ICD-10-CM

## 2017-02-26 DIAGNOSIS — M129 Arthropathy, unspecified: Secondary | ICD-10-CM | POA: Diagnosis not present

## 2017-02-26 DIAGNOSIS — Z79899 Other long term (current) drug therapy: Secondary | ICD-10-CM | POA: Diagnosis not present

## 2017-02-26 DIAGNOSIS — R188 Other ascites: Secondary | ICD-10-CM | POA: Insufficient documentation

## 2017-02-26 DIAGNOSIS — Z87891 Personal history of nicotine dependence: Secondary | ICD-10-CM | POA: Insufficient documentation

## 2017-02-26 DIAGNOSIS — Z9884 Bariatric surgery status: Secondary | ICD-10-CM | POA: Diagnosis not present

## 2017-02-26 LAB — CBC WITH DIFFERENTIAL/PLATELET
Basophils Absolute: 0.1 10*3/uL (ref 0–0.1)
Basophils Relative: 2 %
Eosinophils Absolute: 0.2 10*3/uL (ref 0–0.7)
Eosinophils Relative: 2 %
HEMATOCRIT: 36.3 % (ref 35.0–47.0)
Hemoglobin: 11.7 g/dL — ABNORMAL LOW (ref 12.0–16.0)
LYMPHS ABS: 1.6 10*3/uL (ref 1.0–3.6)
Lymphocytes Relative: 23 %
MCH: 33 pg (ref 26.0–34.0)
MCHC: 32.3 g/dL (ref 32.0–36.0)
MCV: 102 fL — AB (ref 80.0–100.0)
MONOS PCT: 13 %
Monocytes Absolute: 0.9 10*3/uL (ref 0.2–0.9)
NEUTROS ABS: 4.3 10*3/uL (ref 1.4–6.5)
NEUTROS PCT: 60 %
Platelets: 255 10*3/uL (ref 150–440)
RBC: 3.56 MIL/uL — ABNORMAL LOW (ref 3.80–5.20)
RDW: 15.9 % — ABNORMAL HIGH (ref 11.5–14.5)
WBC: 7.2 10*3/uL (ref 3.6–11.0)

## 2017-02-26 MED ORDER — COPPER GLUCONATE 2 MG PO CAPS: 4.0000 mg | ORAL_CAPSULE | Freq: Every day | ORAL | 3 refills | Status: DC

## 2017-02-26 NOTE — Assessment & Plan Note (Addendum)
#  Macrocytic anemia-multifactorial alcoholism/poor absorption from gastric bypass/mineral deficiencies [see discussion below]; improved today hemoglobin is 9.5 MCV 110.  B12 normal.  Discussed that MDS is still a consideration; however since hemoglobin is improving-multiple other possibilities present-recommend holding off bone marrow biopsy at this time.  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  Recommend copper gluconate 4 mg/day;  zinc 50 mg/day.  Prescriptions given.   #Thrombocytopenia-platelet count 14 during hospitalization/likely secondary to sepsis/acute alcoholism.  Currently normal.  #Recommend continued alcohol abstinence.  # Follow up in 3 months/labs; labs- today.

## 2017-02-26 NOTE — Progress Notes (Signed)
[name] CONSULT NOTE  Patient Care Team: Cletis Athens, MD as PCP - General (Internal Medicine)  CHIEF COMPLAINTS/PURPOSE OF CONSULTATION:  Macrocytic anemia  # MACROCYTIC anemia- Likely sec to alcoholism/history of gastric bypass/Nutritional deficiency  #Alcoholism/multiple falls/lower extremity /DT/ acute Respiratory failure [s/p intubation]- Oct 2018   No history exists.     HISTORY OF PRESENTING ILLNESS:  Sonya Park 76 y.o.  female history of alcoholism/abuse; history of gastric bypass-evaluated in the hospital for severe macrocytic anemia.  Patient was recently admitted to the hospital status post fall/sepsis. Patient has been treated with antibiotics; and also during the hospitalization underwent DTs.   Patient's clinical course during the hospitalization patient was noted to have elevated bilirubin around 12; and also had severe thrombocytopenia with platelet count of 14. Patient's hemoglobin was around 7.4; MCV of 110 to 117.   Patient received blood transfusions.   Patient also had chronic diarrhea in the hospital needing a rectal tube-currently discontinued.  Stools are more formed as per patient.  Foley catheter taken out.  Patient is currently wearing compression bandages the chronic leg ulcers. Patient is currently in a nursing home; awaiting discharge to her own home in the next week or so.  Patient currently states that she is abstinent of alcohol.  ROS: A complete 10 point review of system is done which is negative except mentioned above in history of present illness  MEDICAL HISTORY:  Past Medical History:  Diagnosis Date  . Arthritis   . Back pain   . Insomnia   . Medical history non-contributory   . Osteoporosis     SURGICAL HISTORY: Past Surgical History:  Procedure Laterality Date  . ABDOMINAL HYSTERECTOMY    . BREAST LUMPECTOMY Right   . COLONOSCOPY WITH PROPOFOL N/A 07/18/2015   Procedure: COLONOSCOPY WITH PROPOFOL;  Surgeon:  [name], MD;  Location: [facility] ENDOSCOPY;  Service: Endoscopy;  Laterality: N/A;  . COSMETIC SURGERY    . GASTRIC BYPASS     X2  . HEMORROIDECTOMY    . KNEE ARTHROPLASTY Right 12/13/2015   Procedure: COMPUTER ASSISTED TOTAL KNEE ARTHROPLASTY;  Surgeon: Dereck Leep, MD;  Location: [facility] ORS;  Service: Orthopedics;  Laterality: Right;  . KNEE ARTHROSCOPY    . REPLACEMENT TOTAL KNEE Left     SOCIAL HISTORY: Social History   Socioeconomic History  . Marital status: Married    Spouse name: Not on file  . Number of children: Not on file  . Years of education: Not on file  . Highest education level: Not on file  Social Needs  . Financial resource strain: Not on file  . Food insecurity - worry: Not on file  . Food insecurity - inability: Not on file  . Transportation needs - medical: Not on file  . Transportation needs - non-medical: Not on file  Occupational History  . Not on file  Tobacco Use  . Smoking status: Former Smoker    Last attempt to quit: 11/30/1994    Years since quitting: 22.2  . Smokeless tobacco: Never Used  Substance and Sexual Activity  . Alcohol use: Yes    Alcohol/week: 12.6 - 21.0 oz    Types: 21 - 35 Glasses of wine per week    Comment: 3-5 glasses of wine/ day  . Drug use: No  . Sexual activity: Not on file  Other Topics Concern  . Not on file  Social History Narrative  . Not on file    FAMILY HISTORY: Family  History  Problem Relation Age of Onset  . Liver disease Mother   . Heart attack Father   . Cancer Father   . CAD Sister     ALLERGIES:  is allergic to other and penicillins.  MEDICATIONS:  Current Outpatient Medications  Medication Sig Dispense Refill  . acetaminophen (TYLENOL) 325 MG tablet Take 2 tablets (650 mg total) every 6 (six) hours as needed by mouth for moderate pain (headache).    Marland Kitchen acidophilus (RISAQUAD) CAPS capsule Take 2 capsules 3 (three) times daily by mouth.    Marland Kitchen aspirin 81 MG tablet Take 81 mg by mouth daily.    Marland Kitchen  CALCIUM CARBONATE PO Take 1 tablet by mouth daily.     . cholecalciferol (VITAMIN D) 1000 UNITS tablet Take 2,000 Units by mouth daily.     . collagenase (SANTYL) ointment Apply daily topically. 15 g 0  . estrogens, conjugated, (PREMARIN) 0.625 MG tablet Take 0.625 mg by mouth daily. Take daily for 21 days then do not take for 7 days.    . ferrous sulfate 325 (65 FE) MG tablet Take 1 tablet (325 mg total) 2 (two) times daily with a meal by mouth.  3  . folic acid (FOLVITE) 1 MG tablet Take 1 tablet (1 mg total) daily by mouth.    . ibandronate (BONIVA) 150 MG tablet Take 150 mg by mouth every 30 (thirty) days. Take in the morning with a full glass of water, on an empty stomach, and do not take anything else by mouth or lie down for the next 30 min.    Marland Kitchen LORazepam (ATIVAN) 0.5 MG tablet Take 1 tablet (0.5 mg total) at bedtime as needed by mouth for sleep. 15 tablet 0  . multivitamin-lutein (OCUVITE-LUTEIN) CAPS capsule Take 1 capsule daily by mouth.  0  . rifaximin (XIFAXAN) 550 MG TABS tablet Take 1 tablet (550 mg total) 2 (two) times daily by mouth. 42 tablet   . thiamine 100 MG tablet Take 1 tablet (100 mg total) daily by mouth.    . traMADol (ULTRAM) 50 MG tablet Take 1 tablet (50 mg total) every 12 (twelve) hours as needed by mouth for moderate pain. 30 tablet 0  . vitamin B-12 500 MCG tablet Take 1 tablet (500 mcg total) daily by mouth.    . vitamin C (VITAMIN C) 500 MG tablet Take 1 tablet (500 mg total) daily by mouth.    . zinc sulfate 220 (50 Zn) MG capsule Take 1 capsule (220 mg total) daily by mouth.    . Copper Gluconate 2 MG CAPS Take 4 mg by mouth daily. 60 capsule 3  . diphenhydrAMINE-zinc acetate (BENADRYL) cream Apply 3 (three) times daily as needed topically for itching. (Patient not taking: Reported on 02/26/2017) 28.4 g 0  . feeding supplement, ENSURE ENLIVE, (ENSURE ENLIVE) LIQD Take 237 mLs 3 (three) times daily between meals by mouth. (Patient not taking: Reported on  02/26/2017) 237 mL 12  . furosemide (LASIX) 20 MG tablet Take 1 tablet (20 mg total) daily by mouth. (Patient not taking: Reported on 02/26/2017) 30 tablet 11  . ipratropium-albuterol (DUONEB) 0.5-2.5 (3) MG/3ML SOLN Take 3 mLs every 6 (six) hours as needed by nebulization. (Patient not taking: Reported on 02/26/2017) 360 mL   . loperamide (IMODIUM) 2 MG capsule Take 1 capsule (2 mg total) 4 (four) times daily as needed by mouth for diarrhea or loose stools. (Patient not taking: Reported on 02/26/2017) 30 capsule 0   No current facility-administered  medications for this visit.       Marland Kitchen  PHYSICAL EXAMINATION: ECOG PERFORMANCE STATUS: 2 - Symptomatic, <50% confined to bed  Vitals:   02/26/17 1340  BP: 119/73  Pulse: 87  Resp: 16  Temp: (!) 97 F (36.1 C)   Filed Weights   02/26/17 1340  Weight: 133 lb 9.6 oz (60.6 kg)    GENERAL: Frail appearing Caucasian female patient in a wheelchair.  Complete by caregiver.  Well-developed; Alert, no distress and comfortable.   EYES: no pallor or icterus OROPHARYNX: no thrush or ulceration; good dentition  NECK: supple, no masses felt LYMPH:  no palpable lymphadenopathy in the cervical, axillary or inguinal regions LUNGS: clear to auscultation and  No wheeze or crackles HEART/CVS: regular rate & rhythm and no murmurs; chronic bilateral lower extremity edema/bandaged. ABDOMEN: abdomen soft, non-tender and normal bowel sounds Musculoskeletal:no cyanosis of digits and no clubbing  PSYCH: alert & oriented x 3 with fluent speech NEURO: no focal motor/sensory deficits SKIN:  no rashes or significant lesions  LABORATORY DATA:  I have reviewed the data as listed Lab Results  Component Value Date   WBC 7.2 02/26/2017   HGB 11.7 (L) 02/26/2017   HCT 36.3 02/26/2017   MCV 102.0 (H) 02/26/2017   PLT 255 02/26/2017   Recent Labs    01/16/17 0421  01/22/17 0449  01/29/17 1339  02/09/17 0849 02/09/17 2138 02/10/17 1239 02/11/17 0826  NA 139    < > 137   < > 145   < > 137 137 137 136  K 3.9   < > 3.9   < > 3.2*   < > 3.9 5.2* 4.6 4.2  CL 103   < > 107   < > 113*   < > 106 106 107 105  CO2 20*   < > 26   < > 27   < > _0 GLUCOSE 62*   < > 117*   < > 77   < > 153* 123* 88 78  BUN 13   < > 43*   < > 26*   < > _1 CREATININE 1.01*   < > 0.80   < > 0.37*   < > 0.45 0.38* <0.30* <0.30*  CALCIUM 6.0*   < > 7.3*   < > 6.8*   < > 8.0* 8.2* 8.1* 8.0*  GFRNONAA 53*   < > >60   < > >60   < > >60 >60 NOT CALCULATED NOT CALCULATED  GFRAA >60   < > >60   < > >60   < > >60 >60 NOT CALCULATED NOT CALCULATED  PROT 3.7*   < > 3.8*   < > 3.9*   < > 4.6* 5.0*  --  4.9*  ALBUMIN 1.7*   < > 1.6*   < > 1.5*   < > 2.6* 2.9*  --  2.8*  AST 182*   < > 240*   < > 126*   < > 56* 53*  --  44*  ALT 50   < > 65*   < > 92*   < > 23 23  --  23  ALKPHOS 89   < > 252*   < > 167*   < > 109 98  --  103  BILITOT 3.3*   < > 10.8*   < > 2.2*   < > 1.3* 1.4*  --  1.5*  BILIDIR  2.1*  --  6.5*  --  0.9*  --   --   --   --   --   IBILI 1.2*  --  4.3*  --  1.3*  --   --   --   --   --    < > = values in this interval not displayed.    RADIOGRAPHIC STUDIES: I have personally reviewed the radiological images as listed and agreed with the findings in the report. US Abdomen Complete  Result Date: 01/29/2017 CLINICAL DATA:  Elevated LFTs. EXAM: ABDOMEN ULTRASOUND COMPLETE COMPARISON:  Right upper quadrant ultrasound 01/21/2017. CT of the abdomen and pelvis 01/14/2017. FINDINGS: Gallbladder: The gallbladder wall is markedly thickened, measuring up to 8.8 mm. A 1.1 cm shadowing stone is again noted at the neck of the gallbladder. There is no sonographic Murphy sign. Layering sludge is present. Common bile duct: Diameter: 4.1 mm, within normal limits. Liver: There is diffuse fatty infiltration of liver. No discrete lesions are present. Portal vein is patent on color Doppler imaging with normal direction of blood flow towards the liver. IVC: No abnormality  visualized. Pancreas: Visualized portion unremarkable. Spleen: Size and appearance within normal limits. Maximal length is 4.8 cm. Right Kidney: Length: 10.4 cm, within normal limits. Echogenicity within normal limits. No mass or hydronephrosis visualized. Left Kidney: Length: 11.7 cm, within normal limits. Echogenicity within normal limits. No mass or hydronephrosis visualized. Abdominal aorta: Maximal diameter is 2.7 cm. Atherosclerotic changes are noted. Other findings: Increasing diffuse abdominal ascites is present. IMPRESSION: 1. Progressive wall thickening of the gallbladder with a 1.1 cm shadowing stone at the neck of the gallbladder. Findings are concerning for developing acute cholecystitis. 2. Progressive abdominal ascites. 3. Hepatic steatosis. Electronically Signed   By: San Morelle M.D.   On: 01/29/2017 14:41   Dg Ugi W/high Density W/kub  Result Date: 01/28/2017 CLINICAL DATA:  History of gastric bypass.  Feeding tube. EXAM: UPPER GI SERIES WITH KUB TECHNIQUE: After obtaining a scout radiograph a routine upper GI series was performed using thin density FLUOROSCOPY TIME:  Fluoroscopy Time:  1 minutes 54 seconds Radiation Exposure Index (if provided by the fluoroscopic device): 15.6 Number of Acquired Spot Images: 17 COMPARISON:  Abdomen 01/22/2017. FINDINGS: Feeding tube noted with tip over the upper abdomen. The patient was not able to drink barium through a straw annulus head using a splint. The patient would only drank a small amount of barium. The esophagus and gastric remnant are patent. Proximal small bowel is faintly visualized. Feeding tube tip most likely in an enteric loop. The patient would not tolerate any more imaging for further visualization of the gastroenteric anastomosis and proximal small bowel. IMPRESSION: Very limited exam as above. Esophagus and gastric remnant appear to be patent. Feeding tube noted with tip in enteric loop. Electronically Signed   By: Marcello Moores   Register   On: 01/28/2017 12:35    ASSESSMENT & PLAN:   Macrocytic anemia #Macrocytic anemia-multifactorial alcoholism/poor absorption from gastric bypass/mineral deficiencies [see discussion below]; improved today hemoglobin is 9.5 MCV 110.  B12 normal.  Discussed that MDS is still a consideration; however since hemoglobin is improving-multiple other possibilities present-recommend holding off bone marrow biopsy at this time.  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  Recommend copper gluconate 4 mg/day;  zinc 50 mg/day.  Prescriptions given.   #Recommend continued alcohol abstinence.  # Follow up in 3 months/labs; labs- today.   All questions were answered. The patient knows to call the  clinic with any problems, questions or concerns.    Cammie Sickle, MD 02/27/2017 3:00 AM

## 2017-03-07 LAB — FUNGUS CULTURE, BLOOD: Culture: NO GROWTH

## 2017-03-12 ENCOUNTER — Telehealth: Payer: Self-pay | Admitting: *Deleted

## 2017-03-12 NOTE — Telephone Encounter (Signed)
-----   Message from Elouise Munroe sent at 03/11/2017  3:41 PM EST ----- Contact: [phone] Pt l/m to cancel her Feb 2019 appts

## 2017-03-12 NOTE — Telephone Encounter (Signed)
Noted - Apt cnl in cancer center per pt's request

## 2017-03-21 ENCOUNTER — Other Ambulatory Visit: Payer: Self-pay | Admitting: Nurse Practitioner

## 2017-03-21 DIAGNOSIS — R4189 Other symptoms and signs involving cognitive functions and awareness: Secondary | ICD-10-CM

## 2017-03-25 ENCOUNTER — Other Ambulatory Visit: Payer: Self-pay | Admitting: Internal Medicine

## 2017-03-25 DIAGNOSIS — M79662 Pain in left lower leg: Secondary | ICD-10-CM

## 2017-03-25 DIAGNOSIS — M7989 Other specified soft tissue disorders: Principal | ICD-10-CM

## 2017-03-28 ENCOUNTER — Ambulatory Visit
Admission: RE | Admit: 2017-03-28 | Discharge: 2017-03-28 | Disposition: A | Discharge status: Home / Self Care | Payer: Medicare Other | Source: Ambulatory Visit | Attending: Internal Medicine | Admitting: Internal Medicine

## 2017-03-28 DIAGNOSIS — M7989 Other specified soft tissue disorders: Secondary | ICD-10-CM | POA: Insufficient documentation

## 2017-03-28 DIAGNOSIS — M79662 Pain in left lower leg: Secondary | ICD-10-CM | POA: Diagnosis not present

## 2017-03-29 ENCOUNTER — Ambulatory Visit
Admission: RE | Admit: 2017-03-29 | Discharge: 2017-03-29 | Disposition: A | Discharge status: Home / Self Care | Payer: Medicare Other | Source: Ambulatory Visit | Attending: Nurse Practitioner | Admitting: Nurse Practitioner

## 2017-03-29 DIAGNOSIS — I6782 Cerebral ischemia: Secondary | ICD-10-CM | POA: Insufficient documentation

## 2017-03-29 DIAGNOSIS — Z7409 Other reduced mobility: Secondary | ICD-10-CM | POA: Diagnosis present

## 2017-03-29 DIAGNOSIS — R4189 Other symptoms and signs involving cognitive functions and awareness: Secondary | ICD-10-CM

## 2017-04-10 ENCOUNTER — Other Ambulatory Visit
Admission: RE | Admit: 2017-04-10 | Discharge: 2017-04-10 | Disposition: A | Discharge status: Home / Self Care | Payer: Medicare Other | Source: Ambulatory Visit | Attending: Internal Medicine | Admitting: Internal Medicine

## 2017-04-10 DIAGNOSIS — R7989 Other specified abnormal findings of blood chemistry: Secondary | ICD-10-CM | POA: Diagnosis present

## 2017-04-10 LAB — CBC
HCT: 37.4 % (ref 35.0–47.0)
Hemoglobin: 12.3 g/dL (ref 12.0–16.0)
MCH: 29.9 pg (ref 26.0–34.0)
MCHC: 33 g/dL (ref 32.0–36.0)
MCV: 90.7 fL (ref 80.0–100.0)
Platelets: 241 10*3/uL (ref 150–440)
RBC: 4.12 MIL/uL (ref 3.80–5.20)
RDW: 16.2 % — ABNORMAL HIGH (ref 11.5–14.5)
WBC: 4.9 10*3/uL (ref 3.6–11.0)

## 2017-04-10 LAB — HEPATIC FUNCTION PANEL
ALT: 19 U/L (ref 14–54)
AST: 34 U/L (ref 15–41)
Albumin: 3.3 g/dL — ABNORMAL LOW (ref 3.5–5.0)
Alkaline Phosphatase: 53 U/L (ref 38–126)
Bilirubin, Direct: 0.2 mg/dL (ref 0.1–0.5)
Indirect Bilirubin: 0.3 mg/dL (ref 0.3–0.9)
Total Bilirubin: 0.5 mg/dL (ref 0.3–1.2)
Total Protein: 6.2 g/dL — ABNORMAL LOW (ref 6.5–8.1)

## 2017-04-10 LAB — BASIC METABOLIC PANEL
Anion gap: 8 (ref 5–15)
BUN: 21 mg/dL — ABNORMAL HIGH (ref 6–20)
CO2: 23 mmol/L (ref 22–32)
Calcium: 8.4 mg/dL — ABNORMAL LOW (ref 8.9–10.3)
Chloride: 109 mmol/L (ref 101–111)
Creatinine, Ser: 0.5 mg/dL (ref 0.44–1.00)
GFR calc Af Amer: >60 mL/min (ref >60)
GFR calc non Af Amer: >60 mL/min (ref >60)
Glucose, Bld: 85 mg/dL (ref 65–99)
Potassium: 3.6 mmol/L (ref 3.5–5.1)
Sodium: 140 mmol/L (ref 135–145)

## 2017-04-10 LAB — FERRITIN: Ferritin: 79 ng/mL (ref 11–307)

## 2017-04-10 LAB — TSH: TSH: 1.529 u[IU]/mL (ref 0.350–4.500)

## 2017-04-11 ENCOUNTER — Other Ambulatory Visit
Admission: RE | Admit: 2017-04-11 | Discharge: 2017-04-11 | Disposition: A | Discharge status: Home / Self Care | Payer: Medicare Other | Source: Ambulatory Visit | Attending: Internal Medicine | Admitting: Internal Medicine

## 2017-04-11 DIAGNOSIS — N319 Neuromuscular dysfunction of bladder, unspecified: Secondary | ICD-10-CM | POA: Insufficient documentation

## 2017-04-14 LAB — PROTEIN ELECTROPHORESIS, SERUM
A/G Ratio: 1.1 (ref 0.7–1.7)
Albumin ELP: 3 g/dL (ref 2.9–4.4)
Alpha-1-Globulin: 0.2 g/dL (ref 0.0–0.4)
Alpha-2-Globulin: 0.7 g/dL (ref 0.4–1.0)
Beta Globulin: 0.8 g/dL (ref 0.7–1.3)
Gamma Globulin: 1.2 g/dL (ref 0.4–1.8)
Globulin, Total: 2.8 g/dL (ref 2.2–3.9)
Total Protein ELP: 5.8 g/dL — ABNORMAL LOW (ref 6.0–8.5)

## 2017-04-28 ENCOUNTER — Other Ambulatory Visit: Payer: Self-pay | Admitting: Internal Medicine

## 2017-04-28 DIAGNOSIS — I82402 Acute embolism and thrombosis of unspecified deep veins of left lower extremity: Secondary | ICD-10-CM

## 2017-04-30 ENCOUNTER — Ambulatory Visit (INDEPENDENT_AMBULATORY_CARE_PROVIDER_SITE_OTHER): Payer: Medicare Other | Admitting: Gastroenterology

## 2017-04-30 ENCOUNTER — Encounter: Payer: Self-pay | Admitting: Gastroenterology

## 2017-04-30 ENCOUNTER — Other Ambulatory Visit: Payer: Self-pay

## 2017-04-30 VITALS — BP 132/73 | HR 80 | Ht 66.0 in | Wt 132.0 lb

## 2017-04-30 DIAGNOSIS — R748 Abnormal levels of other serum enzymes: Secondary | ICD-10-CM | POA: Diagnosis not present

## 2017-04-30 NOTE — Progress Notes (Signed)
Primary Care Physician: Cletis Athens, MD  Primary Gastroenterologist:  Dr. [name]  Chief Complaint  Patient presents with  . Elevated Hepatic Enzymes    HPI: Sonya Park is a 77 y.o. female here for follow-up after being in the hospital.  The patient was in the hospital for alcoholic liver disease after a fall.  The patient has been doing very well in her liver enzymes have come back to normal since she has stopped drinking. The patient has not had any weight loss but is gaining weight slowly.  Current Outpatient Medications  Medication Sig Dispense Refill  . aspirin 81 MG tablet Take 81 mg by mouth daily.    Marland Kitchen CALCIUM CARBONATE PO Take 1 tablet by mouth daily.     . calcium citrate-vitamin D (CITRACAL+D) 315-200 MG-UNIT tablet Take by mouth.    . cholecalciferol (VITAMIN D) 1000 UNITS tablet Take 2,000 Units by mouth daily.     . Copper Gluconate 2 MG CAPS Take 4 mg by mouth daily. 60 capsule 3  . ferrous sulfate 325 (65 FE) MG tablet Take 1 tablet (325 mg total) 2 (two) times daily with a meal by mouth.  3  . fluticasone (FLONASE) 50 MCG/ACT nasal spray INSTILL 2 SPRAYS INTO EACH NOSTRIL DAILY    . furosemide (LASIX) 20 MG tablet Take 1 tablet (20 mg total) daily by mouth. 30 tablet 11  . ibandronate (BONIVA) 150 MG tablet Take 150 mg by mouth every 30 (thirty) days. Take in the morning with a full glass of water, on an empty stomach, and do not take anything else by mouth or lie down for the next 30 min.    Marland Kitchen KLOR-CON M20 20 MEQ tablet Take 20 mEq by mouth 3 (three) times daily.  8  . megestrol (MEGACE) 20 MG tablet Take by mouth.    . multivitamin-lutein (OCUVITE-LUTEIN) CAPS capsule Take 1 capsule daily by mouth.  0  . potassium chloride SA (K-DUR,KLOR-CON) 20 MEQ tablet Take by mouth.    . Sod Fluoride-Potassium Nitrate (PREVIDENT 5000 SENSITIVE) 1.1-5 % PSTE once daily.    Marland Kitchen thiamine 100 MG tablet Take 1 tablet (100 mg total) daily by mouth.    . vitamin B-12 500  MCG tablet Take 1 tablet (500 mcg total) daily by mouth.    . vitamin C (VITAMIN C) 500 MG tablet Take 1 tablet (500 mg total) daily by mouth.    Marland Kitchen acetaminophen (TYLENOL) 325 MG tablet Take 2 tablets (650 mg total) every 6 (six) hours as needed by mouth for moderate pain (headache). (Patient not taking: Reported on 04/30/2017)    . acidophilus (RISAQUAD) CAPS capsule Take 2 capsules 3 (three) times daily by mouth. (Patient not taking: Reported on 04/30/2017)    . Biotin 1 MG CAPS Take by mouth.    . collagenase (SANTYL) ointment Apply daily topically. (Patient not taking: Reported on 04/30/2017) 15 g 0  . cyclobenzaprine (FLEXERIL) 10 MG tablet Take by mouth.    . dicyclomine (BENTYL) 20 MG tablet Take by mouth.    . dicyclomine (BENTYL) 20 MG tablet TAKE 1 TABLET BY MOUTH 4 TIMES DAILY  0  . diphenhydrAMINE-zinc acetate (BENADRYL) cream Apply 3 (three) times daily as needed topically for itching. (Patient not taking: Reported on 02/26/2017) 28.4 g 0  . diphenoxylate-atropine (LOMOTIL) 2.5-0.025 MG tablet Take by mouth.    . estrogens, conjugated, (PREMARIN) 0.625 MG tablet Take 0.625 mg by mouth daily. Take daily for 21 days  then do not take for 7 days.    . feeding supplement, ENSURE ENLIVE, (ENSURE ENLIVE) LIQD Take 237 mLs 3 (three) times daily between meals by mouth. (Patient not taking: Reported on 02/26/2017) 400 mL 12  . folic acid (FOLVITE) 1 MG tablet Take 1 tablet (1 mg total) daily by mouth. (Patient not taking: Reported on 04/30/2017)    . ipratropium-albuterol (DUONEB) 0.5-2.5 (3) MG/3ML SOLN Take 3 mLs every 6 (six) hours as needed by nebulization. (Patient not taking: Reported on 02/26/2017) 360 mL   . loperamide (IMODIUM) 2 MG capsule Take 1 capsule (2 mg total) 4 (four) times daily as needed by mouth for diarrhea or loose stools. (Patient not taking: Reported on 02/26/2017) 30 capsule 0  . LORazepam (ATIVAN) 0.5 MG tablet Take 1 tablet (0.5 mg total) at bedtime as needed by mouth for  sleep. (Patient not taking: Reported on 04/30/2017) 15 tablet 0  . rifaximin (XIFAXAN) 550 MG TABS tablet Take 1 tablet (550 mg total) 2 (two) times daily by mouth. (Patient not taking: Reported on 04/30/2017) 42 tablet   . traMADol (ULTRAM) 50 MG tablet Take 1 tablet (50 mg total) every 12 (twelve) hours as needed by mouth for moderate pain. (Patient not taking: Reported on 04/30/2017) 30 tablet 0  . zinc sulfate 220 (50 Zn) MG capsule Take 1 capsule (220 mg total) daily by mouth. (Patient not taking: Reported on 04/30/2017)    . zolpidem (AMBIEN) 5 MG tablet      No current facility-administered medications for this visit.     Allergies as of 04/30/2017 - Review Complete 04/30/2017  Allergen Reaction Noted  . Other Itching 11/30/2015  . Penicillins Swelling 09/17/2014    ROS:  General: Negative for anorexia, weight loss, fever, chills, fatigue, weakness. ENT: Negative for hoarseness, difficulty swallowing , nasal congestion. CV: Negative for chest pain, angina, palpitations, dyspnea on exertion, peripheral edema.  Respiratory: Negative for dyspnea at rest, dyspnea on exertion, cough, sputum, wheezing.  GI: See history of present illness. GU:  Negative for dysuria, hematuria, urinary incontinence, urinary frequency, nocturnal urination.  Endo: Negative for unusual weight change.    Physical Examination:   BP 132/73   Pulse 80   Ht 5\' 6"  (1.676 m)   Wt 132 lb (59.9 kg)   BMI 21.31 kg/m   General: Well-nourished, well-developed in no acute distress.  Eyes: No icterus. Conjunctivae pink. Mouth: Oropharyngeal mucosa moist and pink , no lesions erythema or exudate. Lungs: Clear to auscultation bilaterally. Non-labored. Heart: Regular rate and rhythm, no murmurs rubs or gallops.  Abdomen: Bowel sounds are normal, nontender, nondistended, no hepatosplenomegaly or masses, no abdominal bruits or hernia , no rebound or guarding.   Extremities: No lower extremity edema. No clubbing or  deformities. Neuro: Alert and oriented x 3.  Grossly intact. Skin: Warm and dry, no jaundice.   Psych: Alert and cooperative, normal mood and affect.  Labs:    Imaging Studies: No results found.  Assessment and Plan:   Sonya Park is a 77 y.o. y/o female Who comes in after being the hospital for a history of outlet abuse and had a significant fall.  The patient has been doing well since she has been home.  The patient has continued to continue to avoid alcohol use and eat a well-balanced diet.  The patient's liver enzymes are now back to normal.  Despite the patient's long-term alcohol abuse there does not seem to be any cirrhosis on her imaging.  The  patient has been told to follow up with me as needed.    [name], MD. Marval Regal   Note: This dictation was prepared with Dragon dictation along with smaller phrase technology. Any transcriptional errors that result from this process are unintentional.

## 2017-05-02 ENCOUNTER — Ambulatory Visit
Admission: RE | Admit: 2017-05-02 | Discharge: 2017-05-02 | Disposition: A | Discharge status: Home / Self Care | Payer: Medicare Other | Source: Ambulatory Visit | Attending: Internal Medicine | Admitting: Internal Medicine

## 2017-05-02 DIAGNOSIS — M7989 Other specified soft tissue disorders: Secondary | ICD-10-CM | POA: Diagnosis not present

## 2017-05-02 DIAGNOSIS — I82402 Acute embolism and thrombosis of unspecified deep veins of left lower extremity: Secondary | ICD-10-CM

## 2017-05-28 ENCOUNTER — Ambulatory Visit: Payer: Medicare Other | Admitting: Internal Medicine

## 2017-05-28 ENCOUNTER — Other Ambulatory Visit: Payer: Medicare Other

## 2017-08-11 ENCOUNTER — Ambulatory Visit (INDEPENDENT_AMBULATORY_CARE_PROVIDER_SITE_OTHER): Payer: Medicare Other | Admitting: Vascular Surgery

## 2017-08-11 ENCOUNTER — Encounter (INDEPENDENT_AMBULATORY_CARE_PROVIDER_SITE_OTHER): Payer: Self-pay | Admitting: Vascular Surgery

## 2017-08-11 VITALS — BP 127/69 | HR 80 | Resp 16 | Ht 66.0 in | Wt 135.8 lb

## 2017-08-11 DIAGNOSIS — I890 Lymphedema, not elsewhere classified: Secondary | ICD-10-CM | POA: Insufficient documentation

## 2017-08-11 DIAGNOSIS — R601 Generalized edema: Secondary | ICD-10-CM

## 2017-08-11 DIAGNOSIS — K7040 Alcoholic hepatic failure without coma: Secondary | ICD-10-CM

## 2017-08-11 NOTE — Progress Notes (Signed)
MRN : [id number]  Sonya Park is a 77 y.o. (Aug 16, 1940) female who presents with chief complaint of leg swelling.  History of Present Illness: Patient is seen for evaluation of leg pain and leg swelling. The patient first noticed the swelling remotely. The swelling is associated with pain and discoloration. The pain and swelling worsens with prolonged dependency and improves with elevation. The pain is unrelated to activity.  The patient notes that in the morning the legs are significantly improved but they steadily worsened throughout the course of the day. The patient also notes a steady worsening of the discoloration in the ankle and shin area.   The patient denies claudication symptoms.  The patient denies symptoms consistent with rest pain.  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  The patient has not had any past angiography, interventions or vascular surgery.  He has a documented history of cirrhosis and anasarca but at this time she has stopped drinking and her LFT's are normalized  Albumen is normal range as well  Elevation makes the leg symptoms better, dependency makes them much worse. There is no history of ulcerations. The patient denies any recent changes in medications.  The patient has not been wearing graduated compression.  The patient denies a history of DVT or PE. There is no prior history of phlebitis. There is no history of primary lymphedema.  No history of malignancies. No history of trauma or groin or pelvic surgery. There is no history of radiation treatment to the groin or pelvis  The patient denies amaurosis fugax or recent TIA symptoms. There are no recent neurological changes noted. The patient denies recent episodes of angina or shortness of breath  No outpatient medications have been marked as taking for the 08/11/17 encounter (Appointment) with Delana Meyer, Dolores Lory, MD.    Past Medical History:  Diagnosis Date  . Arthritis     . Back pain   . Insomnia   . Medical history non-contributory   . Osteoporosis     Past Surgical History:  Procedure Laterality Date  . ABDOMINAL HYSTERECTOMY    . BREAST LUMPECTOMY Right   . COLONOSCOPY WITH PROPOFOL N/A 07/18/2015   Procedure: COLONOSCOPY WITH PROPOFOL;  Surgeon: [name], MD;  Location: [facility] ENDOSCOPY;  Service: Endoscopy;  Laterality: N/A;  . COSMETIC SURGERY    . GASTRIC BYPASS     X2  . HEMORROIDECTOMY    . KNEE ARTHROPLASTY Right 12/13/2015   Procedure: COMPUTER ASSISTED TOTAL KNEE ARTHROPLASTY;  Surgeon: Dereck Leep, MD;  Location: [facility] ORS;  Service: Orthopedics;  Laterality: Right;  . KNEE ARTHROSCOPY    . REPLACEMENT TOTAL KNEE Left     Social History Social History   Tobacco Use  . Smoking status: Former Smoker    Last attempt to quit: 11/30/1994    Years since quitting: 22.7  . Smokeless tobacco: Never Used  Substance Use Topics  . Alcohol use: No    Alcohol/week: 12.6 - 21.0 oz    Types: 21 - 35 Glasses of wine per week    Frequency: Never    Comment: 3-5 glasses of wine/ day  . Drug use: No    Family History Family History  Problem Relation Age of Onset  . Liver disease Mother   . Heart attack Father   . Cancer Father   . CAD Sister   No family history of bleeding/clotting disorders, porphyria or autoimmune disease   Allergies  Allergen Reactions  .  Other Itching    "mycin" doesn't know which one  . Penicillins Swelling    Has patient had a PCN reaction causing immediate rash, facial/tongue/throat swelling, SOB or lightheadedness with hypotension: Yes Has patient had a PCN reaction causing severe rash involving mucus membranes or skin necrosis: No Has patient had a PCN reaction that required hospitalization: No Has patient had a PCN reaction occurring within the last 10 years: No If all of the above answers are "NO", then may proceed with Cephalosporin use.     REVIEW OF SYSTEMS (Negative unless  checked)  Constitutional: [] Weight loss  [] Fever  [] Chills Cardiac: [] Chest pain   [] Chest pressure   [] Palpitations   [] Shortness of breath when laying flat   [] Shortness of breath with exertion. Vascular:  [] Pain in legs with walking   [x] Pain in legs at rest  [] History of DVT   [] Phlebitis   [x] Swelling in legs   [] Varicose veins   [] Non-healing ulcers Pulmonary:   [] Uses home oxygen   [] Productive cough   [] Hemoptysis   [] Wheeze  [] COPD   [] Asthma Neurologic:  [] Dizziness   [] Seizures   [] History of stroke   [] History of TIA  [] Aphasia   [] Vissual changes   [] Weakness or numbness in arm   [] Weakness or numbness in leg Musculoskeletal:   [] Joint swelling   [] Joint pain   [] Low back pain Hematologic:  [] Easy bruising  [] Easy bleeding   [] Hypercoagulable state   [] Anemic Gastrointestinal:  [] Diarrhea   [] Vomiting  [] Gastroesophageal reflux/heartburn   [] Difficulty swallowing. Genitourinary:  [] Chronic kidney disease   [] Difficult urination  [] Frequent urination   [] Blood in urine Skin:  [x] Rashes   [] Ulcers  Psychological:  [] History of anxiety   []  History of major depression.  Physical Examination  There were no vitals filed for this visit. There is no height or weight on file to calculate BMI. Gen: WD/WN, NAD Head: [place]/AT, No temporalis wasting.  Ear/Nose/Throat: Hearing grossly intact, nares w/o erythema or drainage, poor dentition Eyes: PER, EOMI, sclera nonicteric.  Neck: Supple, no masses.  No bruit or JVD.  Pulmonary:  Good air movement, clear to auscultation bilaterally, no use of accessory muscles.  Cardiac: RRR, normal S1, S2, no Murmurs. Vascular: scattered varicosities present bilaterally especially at the ankle level.  Mild venous stasis changes to the legs bilaterally.  3+ soft pitting edema Vessel Right Left  Radial Palpable Palpable  PT Palpable Palpable  DP Palpable Palpable  Gastrointestinal: soft, non-distended. No guarding/no peritoneal signs.  Musculoskeletal: M/S  5/5 throughout.  No deformity or atrophy.  Neurologic: CN 2-12 intact. Pain and light touch intact in extremities.  Symmetrical.  Speech is fluent. Motor exam as listed above. Psychiatric: Judgment intact, Mood & affect appropriate for pt's clinical situation. Dermatologic: mild venous rashes but no ulcers noted.  No changes consistent with cellulitis. Lymph : No Cervical lymphadenopathy, no lichenification or skin changes of chronic lymphedema.  CBC Lab Results  Component Value Date   WBC 4.9 04/10/2017   HGB 12.3 04/10/2017   HCT 37.4 04/10/2017   MCV 90.7 04/10/2017   PLT 241 04/10/2017    BMET    Component Value Date/Time   NA 140 04/10/2017 1314   K 3.6 04/10/2017 1314   CL 109 04/10/2017 1314   CO2 23 04/10/2017 1314   GLUCOSE 85 04/10/2017 1314   BUN 21 (H) 04/10/2017 1314   CREATININE 0.50 04/10/2017 1314   CALCIUM 8.4 (L) 04/10/2017 1314   GFRNONAA >60 04/10/2017 1314  GFRAA >60 04/10/2017 1314   CrCl cannot be calculated (Patient's most recent lab result is older than the maximum 21 days allowed.).  COAG Lab Results  Component Value Date   INR 1.00 01/30/2017   INR 1.29 01/14/2017   INR 0.95 11/30/2015    Radiology No results found.    Assessment/Plan 1. Lymphedema I have had a long discussion with the patient regarding swelling and why it  causes symptoms.  Patient will begin wearing graduated compression stockings class 1 (20-30 mmHg) on a daily basis a prescription was given. The patient will  beginning wearing the stockings first thing in the morning and removing them in the evening. The patient is instructed specifically not to sleep in the stockings.   In addition, behavioral modification will be initiated.  This will include frequent elevation, use of over the counter pain medications and exercise such as walking.  I have reviewed systemic causes for chronic edema such as liver, kidney and cardiac etiologies.  The patient denies problems with these  organ systems.    Consideration for a lymph pump will also be made based upon the effectiveness of conservative therapy.  This would help to improve the edema control and prevent sequela such as ulcers and infections   Patient should undergo duplex ultrasound of the venous system to ensure that DVT or reflux is not present.  The patient will follow-up with me after the ultrasound.    2. 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

## 2017-08-12 ENCOUNTER — Encounter (INDEPENDENT_AMBULATORY_CARE_PROVIDER_SITE_OTHER): Payer: Self-pay | Admitting: Vascular Surgery

## 2017-08-25 ENCOUNTER — Telehealth (INDEPENDENT_AMBULATORY_CARE_PROVIDER_SITE_OTHER): Payer: Self-pay

## 2017-08-25 NOTE — Telephone Encounter (Signed)
Judeen Hammans called to see if we had received the letter of medical necessity for this particular patient?  Checking the doctor's desk to see if we have received it.

## 2017-09-22 ENCOUNTER — Ambulatory Visit (INDEPENDENT_AMBULATORY_CARE_PROVIDER_SITE_OTHER): Payer: Medicare Other

## 2017-09-22 ENCOUNTER — Ambulatory Visit (INDEPENDENT_AMBULATORY_CARE_PROVIDER_SITE_OTHER): Payer: Medicare Other | Admitting: Podiatry

## 2017-09-22 ENCOUNTER — Encounter: Payer: Self-pay | Admitting: Podiatry

## 2017-09-22 VITALS — BP 121/73 | HR 76

## 2017-09-22 DIAGNOSIS — M2012 Hallux valgus (acquired), left foot: Secondary | ICD-10-CM | POA: Diagnosis not present

## 2017-09-22 DIAGNOSIS — M21612 Bunion of left foot: Secondary | ICD-10-CM | POA: Diagnosis not present

## 2017-09-22 DIAGNOSIS — M2011 Hallux valgus (acquired), right foot: Secondary | ICD-10-CM

## 2017-09-22 DIAGNOSIS — G629 Polyneuropathy, unspecified: Secondary | ICD-10-CM | POA: Diagnosis not present

## 2017-09-22 MED ORDER — GABAPENTIN 100 MG PO CAPS: 100.0000 mg | ORAL_CAPSULE | Freq: Three times a day (TID) | ORAL | 0 refills | Status: DC

## 2017-09-22 NOTE — Progress Notes (Signed)
This patient presents to the office with chief complain tof pain in the fourth and fifth toes of the left foot.  This patient states that she fell on the stairs of her house, which cause significant injury to her which required one month of hospitalization and one month of rehabilitation.  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  She says these toes are painful when she exercises in sneakers.  She has no evidence of any swelling or redness noted in her toes.  She is also concerned about the bunions that have developed in the big toe joint on both feet.  She says that the bunions are crossing under the second toe on her right foot greater than her left.  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  She also has been diagnosed with alcoholic liver damage.  She also has been diagnosed with a fracture to the third lumbar of her lower back.  She relates that she has had nerve testing and she has minimal feeling and a tendency to fall frequently when she walks.  She admits she now walks with a limp. She has been working on her balance in her core.  She presents to the office for evaluation and treatment  General Appearance  Alert, conversant and in no acute stress.  Vascular  Dorsalis pedis and posterior tibial  pulses are palpable  bilaterally.  Capillary return is within normal limits  bilaterally. Temperature is within normal limits  bilaterally.  Neurologic  Senn-Weinstein monofilament wire test diminished right foot and LOPS absent left foot.. Muscle power within normal limits bilaterally.  Nails Normal nails noted with no evidence of bacterial or fungal infection.  Orthopedic  No limitations of motion of motion feet .  No crepitus or effusions noted.  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  Overlapping second digits  B/L   Significant swelling and venous stasis left leg.  Lack of motion digits 2-5  B/L.     Skin  normotropic skin with no porokeratosis noted bilaterally.  No signs of infections or ulcers noted.  There is skin lesion over 4th and 5th mets dorsally from her injury.   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  Neuropathy  Left extremity.    Gait abnormality.  IE.  X-rays taken of the left foot do reveal an HAV deformity with no evidence of any bony pathology through the second through fifth toes, left foot.  Patient does have nerve damage, which was caused from her fall, which has led to a fixed contracture of the fourth fourth and fifth toes, especially of the left foot.  There is evidence of skin damage at the site of the injury Over the fourth and fifth metatarsal left foot.  Patient does have absent LOPS  left foot and history of falling due to her gait abnormality.  Therefore, I recommended she be seen by Liliane Channel for Laurance Flatten balance brace therapy if needed   Gardiner Barefoot DPM

## 2017-10-01 ENCOUNTER — Ambulatory Visit: Payer: Medicare Other | Admitting: Orthotics

## 2017-10-01 DIAGNOSIS — M2011 Hallux valgus (acquired), right foot: Secondary | ICD-10-CM

## 2017-10-01 DIAGNOSIS — M2012 Hallux valgus (acquired), left foot: Secondary | ICD-10-CM

## 2017-10-01 DIAGNOSIS — M21612 Bunion of left foot: Principal | ICD-10-CM

## 2017-10-01 DIAGNOSIS — I890 Lymphedema, not elsewhere classified: Secondary | ICD-10-CM

## 2017-10-01 DIAGNOSIS — R269 Unspecified abnormalities of gait and mobility: Secondary | ICD-10-CM

## 2017-10-01 DIAGNOSIS — G629 Polyneuropathy, unspecified: Secondary | ICD-10-CM

## 2017-10-01 NOTE — Progress Notes (Signed)
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  Patient tolerated casting procedure well  Size 10.  Dawn will verify insurance.

## 2017-11-05 ENCOUNTER — Other Ambulatory Visit: Payer: Medicare Other | Admitting: Orthotics

## 2017-11-05 DIAGNOSIS — M2011 Hallux valgus (acquired), right foot: Secondary | ICD-10-CM

## 2017-11-05 DIAGNOSIS — R269 Unspecified abnormalities of gait and mobility: Secondary | ICD-10-CM

## 2017-11-05 DIAGNOSIS — G629 Polyneuropathy, unspecified: Secondary | ICD-10-CM

## 2017-11-12 ENCOUNTER — Ambulatory Visit (INDEPENDENT_AMBULATORY_CARE_PROVIDER_SITE_OTHER): Payer: Medicare Other | Admitting: Orthotics

## 2017-11-12 DIAGNOSIS — I890 Lymphedema, not elsewhere classified: Secondary | ICD-10-CM

## 2017-11-12 DIAGNOSIS — G629 Polyneuropathy, unspecified: Secondary | ICD-10-CM

## 2017-11-12 DIAGNOSIS — M2012 Hallux valgus (acquired), left foot: Secondary | ICD-10-CM

## 2017-11-12 DIAGNOSIS — M21612 Bunion of left foot: Secondary | ICD-10-CM | POA: Diagnosis not present

## 2017-11-12 DIAGNOSIS — M2011 Hallux valgus (acquired), right foot: Secondary | ICD-10-CM

## 2017-11-12 NOTE — Progress Notes (Signed)
Patient came in today to pick up Moore Balance Brace (Bilateral).   Patient was able to don brace independently and brace was check for fit to custom.   Patient was observed walking with brace and gait was improved as well as stability.   Patient was advised of care and wearing instructions.  Advised to notify practice if there were any issues; especially skin irritation.  

## 2017-11-17 ENCOUNTER — Encounter (INDEPENDENT_AMBULATORY_CARE_PROVIDER_SITE_OTHER): Payer: Self-pay | Admitting: Vascular Surgery

## 2017-11-17 ENCOUNTER — Ambulatory Visit (INDEPENDENT_AMBULATORY_CARE_PROVIDER_SITE_OTHER): Payer: Medicare Other

## 2017-11-17 ENCOUNTER — Ambulatory Visit (INDEPENDENT_AMBULATORY_CARE_PROVIDER_SITE_OTHER): Payer: Medicare Other | Admitting: Vascular Surgery

## 2017-11-17 VITALS — BP 123/74 | HR 71 | Resp 17 | Ht 66.0 in | Wt 142.0 lb

## 2017-11-17 DIAGNOSIS — I825Y2 Chronic embolism and thrombosis of unspecified deep veins of left proximal lower extremity: Secondary | ICD-10-CM | POA: Diagnosis not present

## 2017-11-17 DIAGNOSIS — I890 Lymphedema, not elsewhere classified: Secondary | ICD-10-CM

## 2017-11-17 NOTE — Progress Notes (Signed)
Subjective:    Patient ID: Sonya Park, female    DOB: 03-24-1941, 77 y.o.   MRN: [id number] Chief Complaint  Patient presents with  . Follow-up    dvt ultrasound   Patient presents for 72-month follow-up.  Since the patient's last visit in May 2019 the patient has been engaging in conservative therapy including wearing medical grade 1 compression socks, elevating her legs and remaining active.  The patient has received her lymphedema pump and uses it once a day towards the evening for an hour daily.  The patient feels that her lower extremity edema and discomfort has improved greatly since the addition of a lymphedema pump.  The patient presents today without complaint.  The patient denies any claudication-like symptoms, rest pain or ulcer formation to the bilateral lower extremity.  The patient denies any worsening in her edema or any recurrent bouts of cellulitis.  The patient underwent a left lower extremity DVT study which was negative.  When compared to the study in January this was also negative.  The patient denies any fever, nausea vomiting.  Review of Systems  Constitutional: Negative.   HENT: Negative.   Eyes: Negative.   Respiratory: Negative.   Cardiovascular: Positive for leg swelling.  Gastrointestinal: Negative.   Endocrine: Negative.   Genitourinary: Negative.   Musculoskeletal: Negative.   Skin: Negative.   Allergic/Immunologic: Negative.   Neurological: Negative.   Hematological: Negative.   Psychiatric/Behavioral: Negative.       Objective:   Physical Exam  Constitutional: She is oriented to person, place, and time. She appears well-developed and well-nourished. No distress.  HENT:  Head: Normocephalic and atraumatic.  Right Ear: External ear normal.  Left Ear: External ear normal.  Eyes: Pupils are equal, round, and reactive to light. Conjunctivae and EOM are normal.  Neck: Normal range of motion.  Cardiovascular: Normal rate, regular rhythm, normal heart  sounds and intact distal pulses.  Pulmonary/Chest: Effort normal and breath sounds normal.  Musculoskeletal: Normal range of motion. She exhibits no edema.  Neurological: She is alert and oriented to person, place, and time.  Skin: Skin is warm and dry. She is not diaphoretic.  Psychiatric: She has a normal mood and affect. Her behavior is normal. Judgment and thought content normal.  Vitals reviewed.  BP 123/74   Pulse 71   Resp 17   Ht 5\' 6"  (1.676 m)   Wt 142 lb (64.4 kg)   BMI 22.92 kg/m   Past Medical History:  Diagnosis Date  . Arthritis   . Back pain   . Insomnia   . Medical history non-contributory   . Osteoporosis    Social History   Socioeconomic History  . Marital status: Married    Spouse name: Not on file  . Number of children: Not on file  . Years of education: Not on file  . Highest education level: Not on file  Occupational History  . Not on file  Social Needs  . Financial resource strain: Not on file  . Food insecurity:    Worry: Not on file    Inability: Not on file  . Transportation needs:    Medical: Not on file    Non-medical: Not on file  Tobacco Use  . Smoking status: Former Smoker    Last attempt to quit: 11/30/1994    Years since quitting: 22.9  . Smokeless tobacco: Never Used  Substance and Sexual Activity  . Alcohol use: No    Alcohol/week: 21.0 - 35.0  standard drinks    Types: 21 - 35 Glasses of wine per week    Frequency: Never    Comment: 3-5 glasses of wine/ day  . Drug use: No  . Sexual activity: Not on file  Lifestyle  . Physical activity:    Days per week: Not on file    Minutes per session: Not on file  . Stress: Not on file  Relationships  . Social connections:    Talks on phone: Not on file    Gets together: Not on file    Attends religious service: Not on file    Active member of club or organization: Not on file    Attends meetings of clubs or organizations: Not on file    Relationship status: Not on file  .  Intimate partner violence:    Fear of current or ex partner: Not on file    Emotionally abused: Not on file    Physically abused: Not on file    Forced sexual activity: Not on file  Other Topics Concern  . Not on file  Social History Narrative  . Not on file   Past Surgical History:  Procedure Laterality Date  . ABDOMINAL HYSTERECTOMY    . BREAST LUMPECTOMY Right   . COLONOSCOPY WITH PROPOFOL N/A 07/18/2015   Procedure: COLONOSCOPY WITH PROPOFOL;  Surgeon: [name], MD;  Location: [facility] END[facility]COPY;  Service: Endoscopy;  Laterality: N/A;  . COSMETIC SURGERY    . GASTRIC BYPASS     X2  . HEMORROIDECTOMY    . KNEE ARTHROPLASTY Right 12/13/2015   Procedure: COMPUTER ASSISTED TOTAL KNEE ARTHROPLASTY;  Surgeon: Dereck Leep, MD;  Location: [facility] ORS;  Service: Orthopedics;  Laterality: Right;  . KNEE ARTHROSCOPY    . REPLACEMENT TOTAL KNEE Left    Family History  Problem Relation Age of Onset  . Liver disease Mother   . Heart attack Father   . Cancer Father   . CAD Sister    Allergies  Allergen Reactions  . Chlorpheniramine   . Penicillins Swelling    Has patient had a PCN reaction causing immediate rash, facial/tongue/throat swelling, SOB or lightheadedness with hypotension: Yes Has patient had a PCN reaction causing severe rash involving mucus membranes or skin necrosis: No Has patient had a PCN reaction that required hospitalization: No Has patient had a PCN reaction occurring within the last 10 years: No If all of the above answers are "NO", then may proceed with Cephalosporin use. Other reaction(s): SWELLING  . Other Itching and Other (See Comments)    "mycin" doesn't know which one "mycin" doesn't know which one      Assessment & Plan:  Patient presents for 61-month follow-up.  Since the patient's last visit in May 2019 the patient has been engaging in conservative therapy including wearing medical grade 1 compression socks, elevating her legs and remaining active.  The  patient has received her lymphedema pump and uses it once a day towards the evening for an hour daily.  The patient feels that her lower extremity edema and discomfort has improved greatly since the addition of a lymphedema pump.  The patient presents today without complaint.  The patient denies any claudication-like symptoms, rest pain or ulcer formation to the bilateral lower extremity.  The patient denies any worsening in her edema or any recurrent bouts of cellulitis.  The patient underwent a left lower extremity DVT study which was negative.  When compared to the study in January this was also  negative.  The patient denies any fever, nausea vomiting.  1. Lymphedema - Stable The patient's symptoms seem to be controlled to the use of conservative therapy and a lymphedema pump The patient should continue wearing medical grade 1 compression socks, elevating her legs and remaining active on a daily basis. 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 aspirin 81 MG tablet Take 81 mg by mouth daily.    . calcium citrate-vitamin D (CITRACAL+D) 315-200 MG-UNIT tablet Take by mouth.    . Cholecalciferol (D-5000) 5000 units TABS Take by mouth.    . Copper Gluconate 2 MG CAPS Take 4 mg by mouth daily. 60 capsule 3  . ferrous sulfate 325 (65 FE) MG tablet Take 1 tablet (325 mg total) 2 (two) times daily with a meal by mouth.  3  . fluticasone (FLONASE) 50 MCG/ACT nasal spray INSTILL 2 SPRAYS INTO EACH NOSTRIL DAILY    . furosemide (LASIX) 20 MG tablet Take 1 tablet (20 mg total) daily by mouth. 30 tablet 11  . Melatonin 5 MG TABS Take by mouth.    . spironolactone  (ALDACTONE) 25 MG tablet Take by mouth.    . thiamine 100 MG tablet Take 1 tablet (100 mg total) daily by mouth.    . vitamin B-12 500 MCG tablet Take 1 tablet (500 mcg total) daily by mouth.    Marland Kitchen acidophilus (RISAQUAD) CAPS capsule Take 2 capsules 3 (three) times daily by mouth.    Marland Kitchen b complex vitamins capsule Take by mouth.    . Biotin 1 MG CAPS Take by mouth.    . folic acid (FOLVITE) 1 MG tablet Take 1 tablet (1 mg total) daily by mouth. (Patient not taking: Reported on 11/17/2017)    . multivitamin-lutein (OCUVITE-LUTEIN) CAPS capsule Take 1 capsule daily by mouth. (Patient not taking: Reported on 11/17/2017)  0  . potassium chloride SA (K-DUR,KLOR-CON) 20 MEQ tablet Take by mouth.    . zinc sulfate 220 (50 Zn) MG capsule Take 1 capsule (220 mg total) daily by mouth. (Patient not taking: Reported on 04/30/2017)     No current facility-administered medications on file prior to visit.    There are no Patient Instructions on file for this visit. No follow-ups on file.  Tai Skelly A Cinderella Christoffersen, PA-C

## 2018-03-10 ENCOUNTER — Other Ambulatory Visit: Payer: Self-pay | Admitting: Orthopedic Surgery

## 2018-03-10 DIAGNOSIS — G8929 Other chronic pain: Secondary | ICD-10-CM

## 2018-03-10 DIAGNOSIS — M25512 Pain in left shoulder: Principal | ICD-10-CM

## 2018-03-18 ENCOUNTER — Other Ambulatory Visit: Payer: Self-pay | Admitting: Internal Medicine

## 2018-03-18 DIAGNOSIS — K7030 Alcoholic cirrhosis of liver without ascites: Secondary | ICD-10-CM

## 2018-03-24 ENCOUNTER — Ambulatory Visit
Admission: RE | Admit: 2018-03-24 | Discharge: 2018-03-24 | Disposition: A | Discharge status: Home / Self Care | Payer: Medicare Other | Source: Ambulatory Visit | Attending: Internal Medicine | Admitting: Internal Medicine

## 2018-03-24 DIAGNOSIS — K8020 Calculus of gallbladder without cholecystitis without obstruction: Secondary | ICD-10-CM | POA: Diagnosis not present

## 2018-03-24 DIAGNOSIS — K7030 Alcoholic cirrhosis of liver without ascites: Secondary | ICD-10-CM

## 2018-03-24 DIAGNOSIS — K838 Other specified diseases of biliary tract: Secondary | ICD-10-CM | POA: Insufficient documentation

## 2018-03-25 ENCOUNTER — Ambulatory Visit
Admission: RE | Admit: 2018-03-25 | Discharge: 2018-03-25 | Disposition: A | Discharge status: Home / Self Care | Payer: Medicare Other | Source: Ambulatory Visit | Attending: Orthopedic Surgery | Admitting: Orthopedic Surgery

## 2018-03-25 DIAGNOSIS — G8929 Other chronic pain: Secondary | ICD-10-CM | POA: Diagnosis not present

## 2018-03-25 DIAGNOSIS — M25512 Pain in left shoulder: Secondary | ICD-10-CM | POA: Diagnosis not present

## 2018-06-15 ENCOUNTER — Telehealth: Payer: Self-pay | Admitting: Gastroenterology

## 2018-06-15 NOTE — Telephone Encounter (Signed)
When should she have her next colonoscopy? Last one was done 07-18-15 by Dr Allen Norris.

## 2018-06-15 NOTE — Telephone Encounter (Signed)
Spoke with pt and this message was suppose to be for her husband.

## 2018-06-17 ENCOUNTER — Telehealth: Payer: Self-pay | Admitting: Pulmonary Disease

## 2018-06-17 NOTE — Telephone Encounter (Signed)
Release form and Insurance document sent to [employer] 06/17/2018 Via [employer] mail.

## 2018-09-15 ENCOUNTER — Other Ambulatory Visit: Payer: Self-pay | Admitting: Orthopedic Surgery

## 2018-09-15 DIAGNOSIS — M5412 Radiculopathy, cervical region: Secondary | ICD-10-CM

## 2018-09-25 ENCOUNTER — Other Ambulatory Visit: Payer: Self-pay

## 2018-09-25 ENCOUNTER — Ambulatory Visit
Admission: RE | Admit: 2018-09-25 | Discharge: 2018-09-25 | Disposition: A | Discharge status: Home / Self Care | Payer: Medicare Other | Source: Ambulatory Visit | Attending: Orthopedic Surgery | Admitting: Orthopedic Surgery

## 2018-09-25 DIAGNOSIS — M5412 Radiculopathy, cervical region: Secondary | ICD-10-CM | POA: Diagnosis present

## 2018-10-22 ENCOUNTER — Other Ambulatory Visit (HOSPITAL_COMMUNITY): Payer: Self-pay

## 2018-10-22 ENCOUNTER — Other Ambulatory Visit (HOSPITAL_COMMUNITY): Payer: Self-pay | Admitting: Nephrology

## 2018-10-22 ENCOUNTER — Other Ambulatory Visit (HOSPITAL_COMMUNITY): Payer: Self-pay | Admitting: Internal Medicine

## 2018-10-22 ENCOUNTER — Other Ambulatory Visit: Payer: Self-pay | Admitting: Internal Medicine

## 2018-10-22 DIAGNOSIS — K7030 Alcoholic cirrhosis of liver without ascites: Secondary | ICD-10-CM

## 2018-10-29 ENCOUNTER — Other Ambulatory Visit: Payer: Self-pay

## 2018-10-29 ENCOUNTER — Ambulatory Visit
Admission: RE | Admit: 2018-10-29 | Discharge: 2018-10-29 | Disposition: A | Discharge status: Home / Self Care | Payer: Medicare Other | Source: Ambulatory Visit | Attending: Internal Medicine | Admitting: Internal Medicine

## 2018-10-29 DIAGNOSIS — K7030 Alcoholic cirrhosis of liver without ascites: Secondary | ICD-10-CM | POA: Diagnosis not present

## 2019-04-15 ENCOUNTER — Ambulatory Visit: Payer: Medicare Other | Attending: Internal Medicine

## 2019-04-15 DIAGNOSIS — Z20822 Contact with and (suspected) exposure to covid-19: Secondary | ICD-10-CM

## 2019-04-17 LAB — NOVEL CORONAVIRUS, NAA: SARS-CoV-2, NAA: NOT DETECTED

## 2019-04-18 ENCOUNTER — Ambulatory Visit: Payer: Medicare Other | Attending: Internal Medicine

## 2019-04-18 DIAGNOSIS — Z23 Encounter for immunization: Secondary | ICD-10-CM

## 2019-04-18 NOTE — Progress Notes (Signed)
   Covid-19 Vaccination [facility]  Name:  Sonya Park    MRN: [id number] DOB: 05/20/1940  04/18/2019  Sonya Park was observed post Covid-19 immunization for 15 minutes without incidence. She was provided with Vaccine Information Sheet and instruction to access the V-Safe system.   Sonya Park was instructed to call 911 with any severe reactions post vaccine: Marland Kitchen Difficulty breathing  . Swelling of your face and throat  . A fast heartbeat  . A bad rash all over your body  . Dizziness and weakness    Immunizations Administered    Name Date Dose VIS Date Route   Pfizer COVID-19 Vaccine 04/18/2019  1:59 PM 0.3 mL 03/19/2019 Intramuscular   Manufacturer: [employer], [employer]   Lot: [id number]   [place]: [id number]

## 2019-04-25 IMAGING — CT CT MAXILLOFACIAL W/O CM
3 series · 15 of 47 positions shown, 18 images · non-contrast
Comparison: CT head without contrast from the same day.

CLINICAL DATA: Fall last night. Bruising and swelling to the face
and chest.

EXAM:
CT MAXILLOFACIAL WITHOUT CONTRAST
TECHNIQUE: Multidetector CT imaging of the maxillofacial structures was
performed. Multiplanar CT image reconstructions were also generated.

[Series 2: max soft · axial · 0.34mm/px · z∈[-210,-66]mm · 9 of 84 slices shown, 12 images]
[im 6/84  brain]
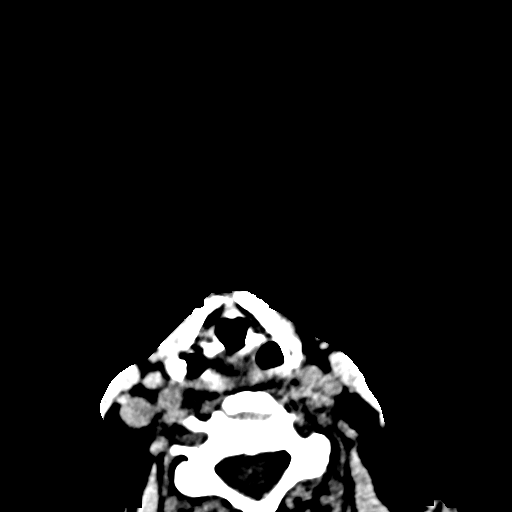
[im 6/84  bone]
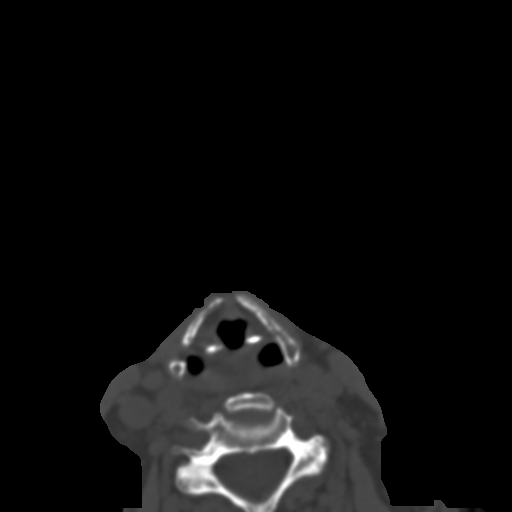
[im 15/84  bone]
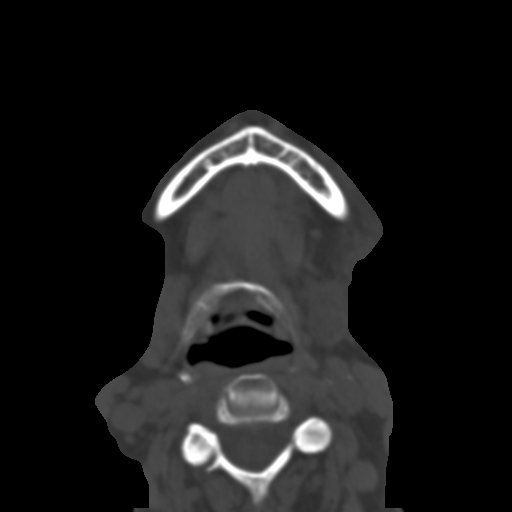
[im 23/84  bone]
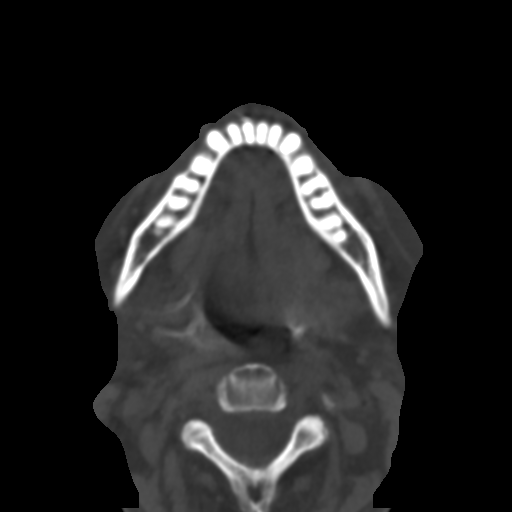
[im 32/84  bone]
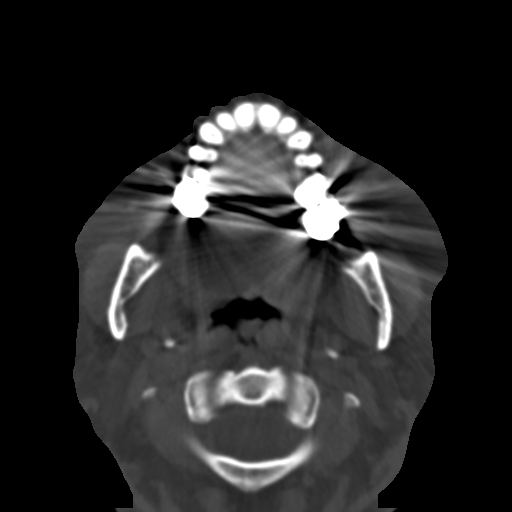
[im 43/84  brain]
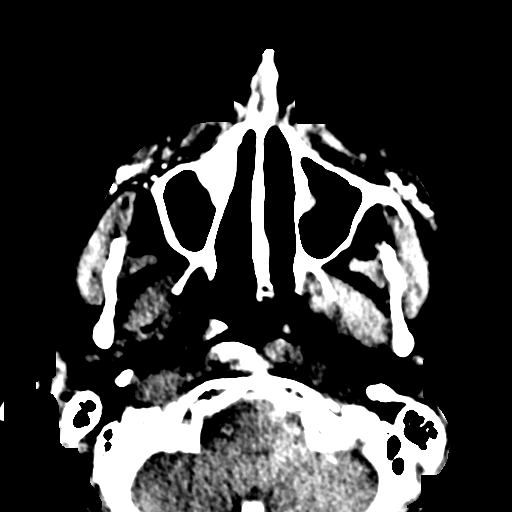
[im 43/84  bone]
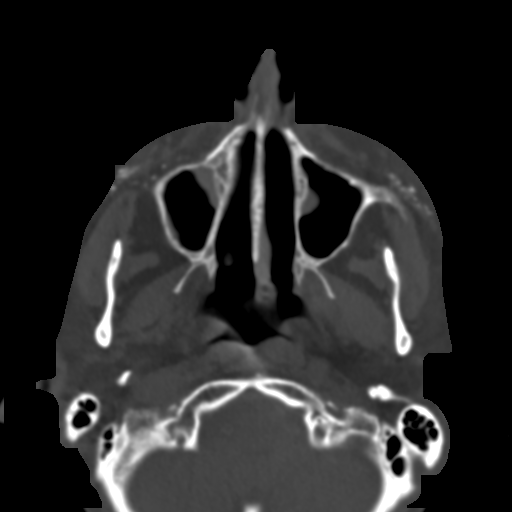
[im 52/84  bone]
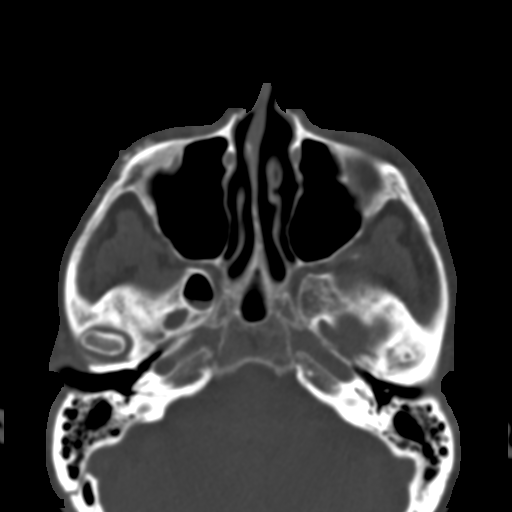
[im 61/84  bone]
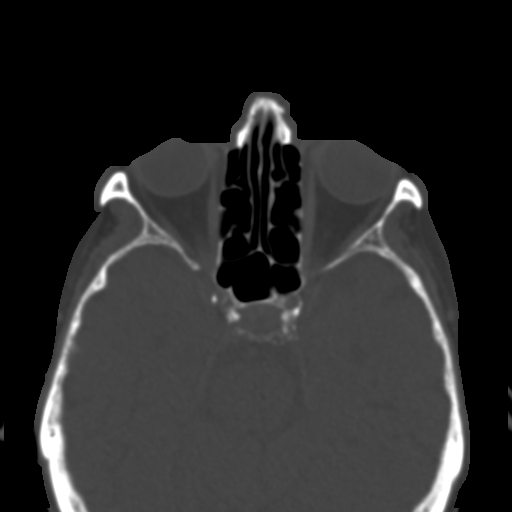
[im 69/84  bone]
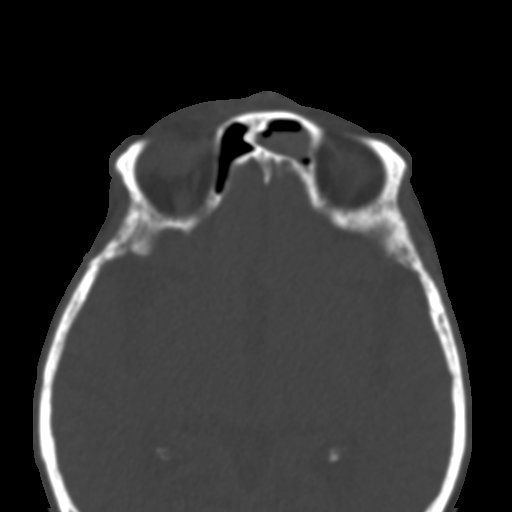
[im 78/84  brain]
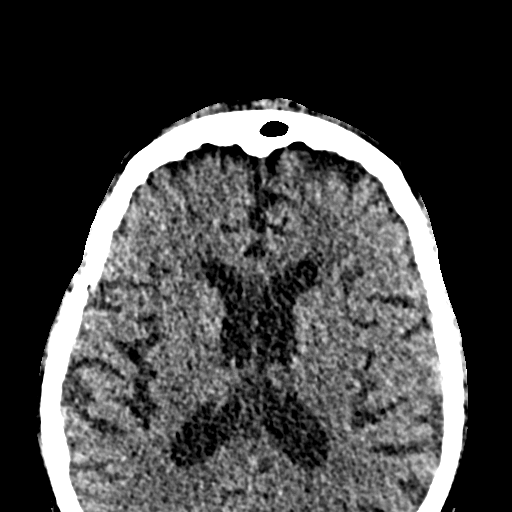
[im 78/84  bone]
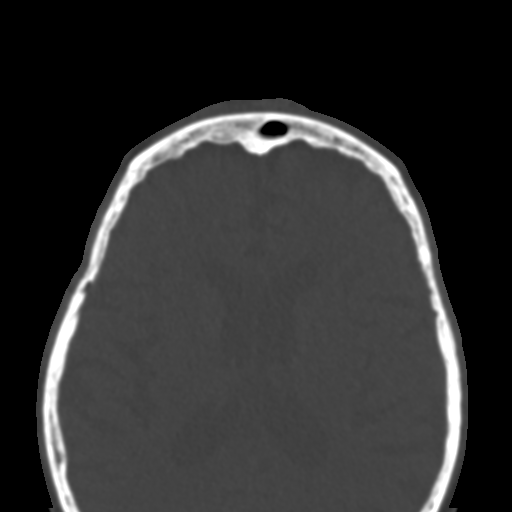

[Series 6: coronal soft · coronal · 0.36mm/px · 3 of 84 slices shown]
[im 28/84  bone]
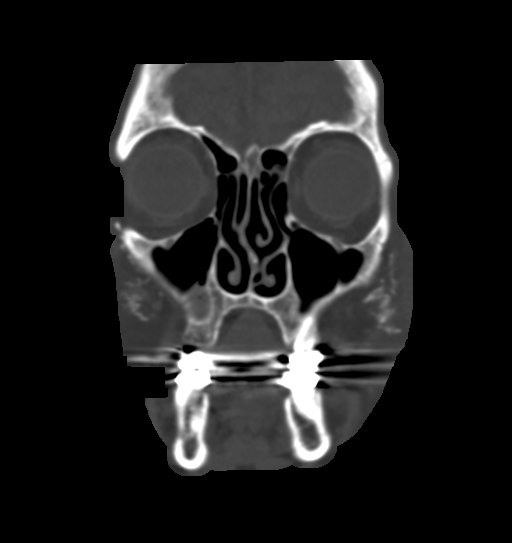
[im 37/84  bone]
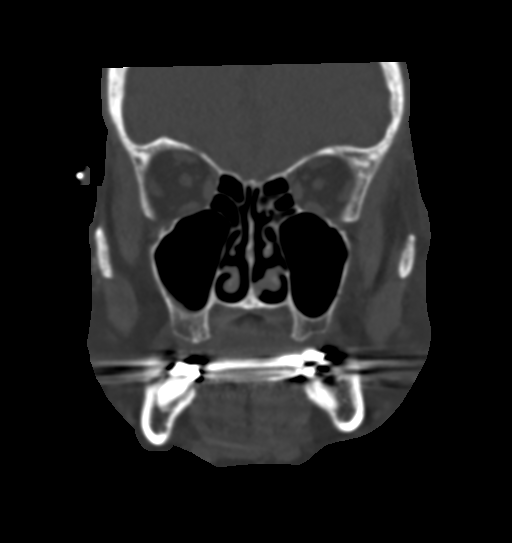
[im 47/84  bone]
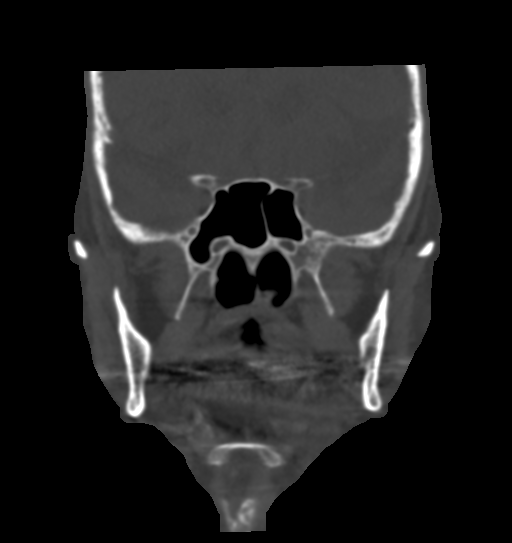

[Series 7: sagittal soft · sagittal · 0.34mm/px · 3 of 75 slices shown]
[im 25/75  bone]
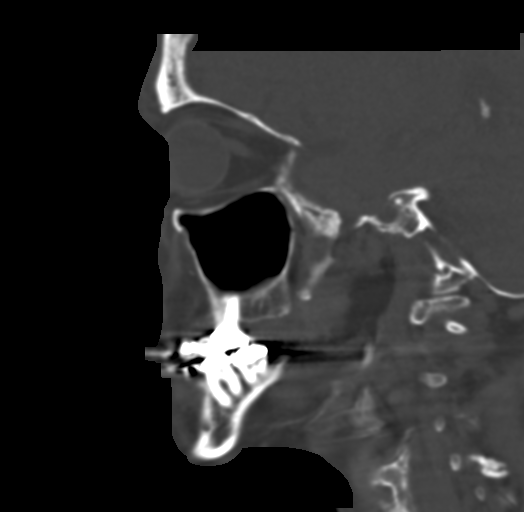
[im 38/75  bone]
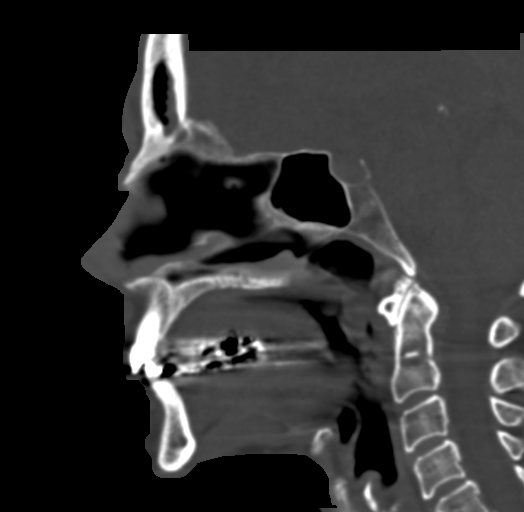
[im 50/75  bone]
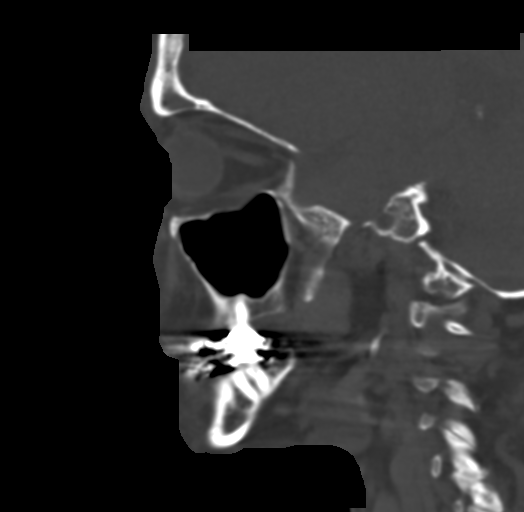

[15 of 47 positions shown; findings below may reference images not displayed]

FINDINGS: Osseous: Bilateral nasal fractures are present. Hemorrhage in the
left frontal sinus suggests an occult fracture. No discrete fracture
is present. The visualized calvarium is intact. The mandible is
intact and located. Advanced degenerative changes are noted within
the TMJ, worse on the left.

Orbits: Periorbital soft tissue swelling and hematoma is worse right
than left. There is no underlying globe injury. Bilateral lens
replacements are present.

Sinuses: Minimal low-density fluid is present in the sphenoid
sinuses bilaterally. Small polyps or mucous retention cysts are
noted inferiorly in the maxillary sinuses. Hemorrhages noted in the
left frontal sinus. The remaining paranasal sinuses are clear. The
mastoid air cells are clear.

Soft tissues: Focal high-density hemorrhage is noted over the bridge
of the nose in the midline. Bilateral periorbital soft tissue
swelling hematoma is present. There is soft tissue edema over the
left side of the face. Calcifications in the subcutaneous tissues of
face bilaterally may be related to prior trauma or injections. The
airway is patent. The visualized soft tissues the neck are within
normal limits.

Limited intracranial: Within normal limits.
IMPRESSION: 1. Bilateral nasal bone fractures without significant displacement.
2. Left frontal hemorrhage suggest an occult fracture. No discrete
fracture is visualized.
3. Extensive bilateral periorbital soft swelling and hematoma with
inflammatory changes extending over the maxilla bilaterally, left
greater than right.

## 2019-05-09 ENCOUNTER — Ambulatory Visit: Payer: Medicare Other | Attending: Internal Medicine

## 2019-05-09 DIAGNOSIS — Z23 Encounter for immunization: Secondary | ICD-10-CM

## 2019-05-09 NOTE — Progress Notes (Signed)
   Covid-19 Vaccination Clinic  Name:  Sonya Park    MRN: [id number] DOB: 1940-08-12  05/09/2019  [name] was observed post Covid-19 immunization for 15 minutes without incidence. She was provided with Vaccine Information Sheet and instruction to access the V-Safe system.   Ms. Wander was instructed to call 911 with any severe reactions post vaccine: Marland Kitchen Difficulty breathing  . Swelling of your face and throat  . A fast heartbeat  . A bad rash all over your body  . Dizziness and weakness    Immunizations Administered    Name Date Dose VIS Date Route   Pfizer COVID-19 Vaccine 05/09/2019  1:00 PM 0.3 mL 03/19/2019 Intramuscular   Manufacturer: [place]   Lot: [id number]   [place]: [id number]

## 2019-09-02 ENCOUNTER — Encounter: Payer: Self-pay | Admitting: Internal Medicine

## 2019-09-02 ENCOUNTER — Other Ambulatory Visit: Payer: Self-pay

## 2019-09-02 ENCOUNTER — Ambulatory Visit: Payer: Medicare Other | Admitting: Internal Medicine

## 2019-09-02 ENCOUNTER — Telehealth: Payer: Self-pay | Admitting: Internal Medicine

## 2019-09-02 VITALS — BP 152/89 | HR 72 | Temp 96.3°F

## 2019-09-02 DIAGNOSIS — D696 Thrombocytopenia, unspecified: Secondary | ICD-10-CM | POA: Diagnosis not present

## 2019-09-02 DIAGNOSIS — J301 Allergic rhinitis due to pollen: Secondary | ICD-10-CM | POA: Diagnosis not present

## 2019-09-02 DIAGNOSIS — I825Y2 Chronic embolism and thrombosis of unspecified deep veins of left proximal lower extremity: Secondary | ICD-10-CM

## 2019-09-02 DIAGNOSIS — D520 Dietary folate deficiency anemia: Secondary | ICD-10-CM | POA: Diagnosis not present

## 2019-09-02 DIAGNOSIS — I10 Essential (primary) hypertension: Secondary | ICD-10-CM

## 2019-09-02 DIAGNOSIS — Z7189 Other specified counseling: Secondary | ICD-10-CM

## 2019-09-02 MED ORDER — LORATADINE 10 MG PO TABS: 10.0000 mg | ORAL_TABLET | Freq: Every day | ORAL | 11 refills | Status: DC

## 2019-09-02 NOTE — Assessment & Plan Note (Signed)
Stable at the present time. 

## 2019-09-02 NOTE — Telephone Encounter (Signed)
Approved by Dr. Lavera Guise. Rx sent to pharmacy.

## 2019-09-02 NOTE — Progress Notes (Signed)
Established Patient Office Visit  Subjective:  Patient ID: Sonya Park, female    DOB: [birth year]  Age: 79 y.o. MRN: [id number]  CC:  Chief Complaint  Patient presents with  . Cough  . Sinusitis    HPI  [name] presents for regular checkup she is doing well.  Denies any history of chest pain shortness of breath on exertion.  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  She has problem with anxiety but it is stable at the present time.  Past Medical History:  Diagnosis Date  . Arthritis   . Back pain   . Insomnia   . Medical history non-contributory   . Osteoporosis     Past Surgical History:  Procedure Laterality Date  . ABDOMINAL HYSTERECTOMY    . BREAST LUMPECTOMY Right   . COLONOSCOPY WITH PROPOFOL N/A 07/18/2015   Procedure: COLONOSCOPY WITH PROPOFOL;  Surgeon: [name], MD;  Location: [facility] ENDOSCOPY;  Service: Endoscopy;  Laterality: N/A;  . COSMETIC SURGERY    . GASTRIC BYPASS     X2  . HEMORROIDECTOMY    . KNEE ARTHROPLASTY Right 12/13/2015   Procedure: COMPUTER ASSISTED TOTAL KNEE ARTHROPLASTY;  Surgeon: Dereck Leep, MD;  Location: [facility] ORS;  Service: Orthopedics;  Laterality: Right;  . KNEE ARTHROSCOPY    . REPLACEMENT TOTAL KNEE Left     Family History  Problem Relation Age of Onset  . Liver disease Mother   . Heart attack Father   . Cancer Father   . CAD Sister     Social History   Socioeconomic History  . Marital status: Married    Spouse name: Not on file  . Number of children: Not on file  . Years of education: Not on file  . Highest education level: Not on file  Occupational History  .  Not on file  Tobacco Use  . Smoking status: Former Smoker    Quit date: 11/30/1994    Years since quitting: 24.7  . Smokeless tobacco: Never Used  Substance and Sexual Activity  . Alcohol use: No    Alcohol/week: 21.0 - 35.0 standard drinks    Types: 21 - 35 Glasses of wine per week    Comment: 3-5 glasses of wine/ day  . Drug use: No  . Sexual activity: Not on file  Other Topics Concern  . Not on file  Social History Narrative  . Not on file   Social Determinants of Health   Financial Resource Strain:   . Difficulty of Paying Living Expenses:   Food Insecurity:   . Worried About Charity fundraiser in the Last Year:   . Arboriculturist in the Last Year:   Transportation Needs:   . Film/video editor (Medical):   Marland Kitchen Lack of Transportation (Non-Medical):   Physical Activity:   . Days of Exercise per Week:   . Minutes of Exercise per Session:   Stress:   . Feeling of Stress :   Social Connections:   . Frequency of Communication with Friends and Family:   . Frequency of  Social Gatherings with Friends and Family:   . Attends Religious Services:   . Active Member of Clubs or Organizations:   . Attends Archivist Meetings:   Marland Kitchen Marital Status:   Intimate Partner Violence:   . Fear of Current or Ex-Partner:   . Emotionally Abused:   Marland Kitchen Physically Abused:   . Sexually Abused:      Current Outpatient Medications:  .  acidophilus (RISAQUAD) CAPS capsule, Take 2 capsules 3 (three) times daily by mouth., Disp: , Rfl:  .  aspirin 81 MG tablet, Take 81 mg by mouth daily., Disp: , Rfl:  .  b complex vitamins capsule, Take by mouth., Disp: , Rfl:  .  Biotin 1 MG CAPS, Take by mouth., Disp: , Rfl:  .  calcium citrate-vitamin D (CITRACAL+D) 315-200 MG-UNIT tablet, Take by mouth., Disp: , Rfl:  .  Cholecalciferol (D-5000) 5000 units TABS, Take by mouth., Disp: , Rfl:  .  Copper Gluconate 2 MG CAPS, Take 4 mg by mouth daily., Disp: 60 capsule, Rfl: 3 .  ferrous sulfate  325 (65 FE) MG tablet, Take 1 tablet (325 mg total) 2 (two) times daily with a meal by mouth., Disp: , Rfl: 3 .  fluticasone (FLONASE) 50 MCG/ACT nasal spray, INSTILL 2 SPRAYS INTO EACH NOSTRIL DAILY, Disp: , Rfl:  .  folic acid (FOLVITE) 1 MG tablet, Take 1 tablet (1 mg total) daily by mouth. (Patient not taking: Reported on 11/17/2017), Disp: , Rfl:  .  furosemide (LASIX) 20 MG tablet, Take 1 tablet (20 mg total) daily by mouth., Disp: 30 tablet, Rfl: 11 .  loratadine (CLARITIN) 10 MG tablet, Take 1 tablet (10 mg total) by mouth daily., Disp: 30 tablet, Rfl: 11 .  Melatonin 5 MG TABS, Take by mouth., Disp: , Rfl:  .  multivitamin-lutein (OCUVITE-LUTEIN) CAPS capsule, Take 1 capsule daily by mouth. (Patient not taking: Reported on 11/17/2017), Disp: , Rfl: 0 .  potassium chloride SA (K-DUR,KLOR-CON) 20 MEQ tablet, Take by mouth., Disp: , Rfl:  .  spironolactone (ALDACTONE) 25 MG tablet, Take by mouth., Disp: , Rfl:  .  thiamine 100 MG tablet, Take 1 tablet (100 mg total) daily by mouth., Disp: , Rfl:  .  vitamin B-12 500 MCG tablet, Take 1 tablet (500 mcg total) daily by mouth., Disp: , Rfl:  .  zinc sulfate 220 (50 Zn) MG capsule, Take 1 capsule (220 mg total) daily by mouth. (Patient not taking: Reported on 04/30/2017), Disp: , Rfl:    Allergies  Allergen Reactions  . Chlorpheniramine   . Penicillins Swelling    Has patient had a PCN reaction causing immediate rash, facial/tongue/throat swelling, SOB or lightheadedness with hypotension: Yes Has patient had a PCN reaction causing severe rash involving mucus membranes or skin necrosis: No Has patient had a PCN reaction that required hospitalization: No Has patient had a PCN reaction occurring within the last 10 years: No If all of the above answers are "NO", then may proceed with Cephalosporin use. Other reaction(s): SWELLING  . Other Itching and Other (See Comments)    "mycin" doesn't know which one "mycin" doesn't know which one     ROS Review of Systems  Constitutional: Negative.   Eyes: Negative.   Respiratory: Negative.   Cardiovascular: Negative.   Gastrointestinal: Negative.   Genitourinary: Negative.   Musculoskeletal: Negative.        Objective:    Physical Exam Physical examination patient is comfortable white female in no acute distress.  Head normocephalic pupils are reactive sclera nonicteric, tongue is papillated neck is supple jugular venous pressure is not elevated there is no goiter no lymphadenopathy trachea is central.  On examination of the cardiovascular system apical impulse is feebly palpable in the fifth intercostal space first and second heart sounds are normal chest clear abdomen is nontender there is no pedal edema.  Neurological examination is nonfocal.  BP (!) 152/89   Pulse 72   Temp (!) 96.3 F (35.7 C) (Temporal)  Wt Readings from Last 3 Encounters:  11/17/17 142 lb (64.4 kg)  08/11/17 135 lb 12.8 oz (61.6 kg)  04/30/17 132 lb (59.9 kg)     Health Maintenance Due  Topic Date Due  . TETANUS/TDAP  Never done  . DEXA SCAN  Never done  . PNA vac Low Risk Adult (1 of 2 - PCV13) Never done    There are no preventive care reminders to display for this patient.  Lab Results  Component Value Date   TSH 1.529 04/10/2017   Lab Results  Component Value Date   WBC 4.9 04/10/2017   HGB 12.3 04/10/2017   HCT 37.4 04/10/2017   MCV 90.7 04/10/2017   PLT 241 04/10/2017   Lab Results  Component Value Date   NA 140 04/10/2017   K 3.6 04/10/2017   CO2 23 04/10/2017   GLUCOSE 85 04/10/2017   BUN 21 (H) 04/10/2017   CREATININE 0.50 04/10/2017   BILITOT 0.5 04/10/2017   ALKPHOS 53 04/10/2017   AST 34 04/10/2017   ALT 19 04/10/2017   PROT 6.2 (L) 04/10/2017   ALBUMIN 3.3 (L) 04/10/2017   CALCIUM 8.4 (L) 04/10/2017   ANIONGAP 8 04/10/2017   No results found for: CHOL No results found for: HDL No results found for: LDLCALC No results found for: TRIG No results found  for: CHOLHDL No results found for: HGBA1C    Assessment & Plan:   Problem List Items Addressed This Visit      Cardiovascular and Mediastinum   Chronic deep vein thrombosis (DVT) of proximal vein of left lower extremity ([place])    Resolved.      Essential hypertension    Stable.        Respiratory   Seasonal allergic rhinitis due to pollen     Other   Goals of care, counseling/discussion    Avoid alcohol and smoking.  She is sober right now.      Thrombocytopenia ([place])    Stable at the present time.      Anemia, macrocytic, nutritional - Primary    Stable at the present time.         No orders of the defined types were placed in this encounter.  1. Anemia, macrocytic, nutritional We will follow regularly.  2. Seasonal allergic rhinitis due to pollen Stable  3. Essential hypertension Changes stable right now  4. Thrombocytopenia ([place]) Stop drinking.  5. 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) No more DVT.  6. Goals of care, counseling/discussion Avoid alcohol Follow-up: Return in about 3 months (around 12/03/2019).    Cletis Athens, MD

## 2019-09-02 NOTE — Telephone Encounter (Signed)
She would like a script of loratadine 10mg  Dr Lavera Guise told her to call back.

## 2019-09-02 NOTE — Assessment & Plan Note (Signed)
Stable

## 2019-09-02 NOTE — Assessment & Plan Note (Signed)
Avoid alcohol and smoking.  She is sober right now.

## 2019-09-02 NOTE — Assessment & Plan Note (Signed)
Resolved

## 2019-12-28 ENCOUNTER — Other Ambulatory Visit: Payer: Self-pay | Admitting: Internal Medicine

## 2019-12-28 ENCOUNTER — Other Ambulatory Visit: Payer: Self-pay | Admitting: *Deleted

## 2019-12-28 ENCOUNTER — Ambulatory Visit: Payer: Medicare Other

## 2019-12-28 ENCOUNTER — Other Ambulatory Visit: Payer: Self-pay

## 2019-12-28 MED ORDER — SPIRONOLACTONE 25 MG PO TABS: 25.0000 mg | ORAL_TABLET | Freq: Every day | ORAL | 3 refills | Status: DC

## 2020-02-02 ENCOUNTER — Other Ambulatory Visit: Payer: Self-pay

## 2020-02-02 ENCOUNTER — Ambulatory Visit (INDEPENDENT_AMBULATORY_CARE_PROVIDER_SITE_OTHER): Payer: Medicare Other | Admitting: Internal Medicine

## 2020-02-02 DIAGNOSIS — Z23 Encounter for immunization: Secondary | ICD-10-CM | POA: Diagnosis not present

## 2020-02-14 ENCOUNTER — Ambulatory Visit (INDEPENDENT_AMBULATORY_CARE_PROVIDER_SITE_OTHER): Payer: Medicare Other | Admitting: Internal Medicine

## 2020-02-14 ENCOUNTER — Encounter: Payer: Self-pay | Admitting: Internal Medicine

## 2020-02-14 ENCOUNTER — Other Ambulatory Visit: Payer: Self-pay

## 2020-02-14 VITALS — BP 134/78 | HR 69 | Ht 66.0 in | Wt 153.1 lb

## 2020-02-14 DIAGNOSIS — D696 Thrombocytopenia, unspecified: Secondary | ICD-10-CM

## 2020-02-14 DIAGNOSIS — J301 Allergic rhinitis due to pollen: Secondary | ICD-10-CM

## 2020-02-14 DIAGNOSIS — I10 Essential (primary) hypertension: Secondary | ICD-10-CM | POA: Diagnosis not present

## 2020-02-14 DIAGNOSIS — Z23 Encounter for immunization: Secondary | ICD-10-CM

## 2020-02-14 DIAGNOSIS — R7989 Other specified abnormal findings of blood chemistry: Secondary | ICD-10-CM | POA: Diagnosis not present

## 2020-02-14 DIAGNOSIS — Z0000 Encounter for general adult medical examination without abnormal findings: Secondary | ICD-10-CM

## 2020-02-14 DIAGNOSIS — D520 Dietary folate deficiency anemia: Secondary | ICD-10-CM | POA: Diagnosis not present

## 2020-02-14 NOTE — Progress Notes (Signed)
Established Patient Office Visit  Subjective:  Patient ID: Sonya Park, female    DOB: Aug 22, 1940  Age: 79 y.o. MRN: [id number]  CC:  Chief Complaint  Patient presents with  . Annual Exam    HPI  Sonya Park presents for annual physical.  Patient is known to have hypertension seasonal allergic rhinitis history of the fracture of the pubic ramus osteoporosis compression fracture of the back in the past has a history of alcohol abuse but she does not drink anymore.  She is also known to have thrombocytopenia and will check it with a blood test.  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  Past Medical History:  Diagnosis Date  . Arthritis   . Back pain   . Insomnia   . Medical history non-contributory   . Osteoporosis     Past Surgical History:  Procedure Laterality Date  . ABDOMINAL HYSTERECTOMY    . BREAST LUMPECTOMY Right   . COLONOSCOPY WITH PROPOFOL N/A 07/18/2015   Procedure: COLONOSCOPY WITH PROPOFOL;  Surgeon: [name], MD;  Location: [facility] ENDOSCOPY;  Service: Endoscopy;  Laterality: N/A;  . COSMETIC SURGERY    . GASTRIC BYPASS     X2  . HEMORROIDECTOMY    . KNEE ARTHROPLASTY Right 12/13/2015   Procedure: COMPUTER ASSISTED TOTAL KNEE ARTHROPLASTY;  Surgeon: Dereck Leep, MD;  Location: [facility] ORS;  Service: Orthopedics;  Laterality: Right;  . KNEE ARTHROSCOPY    . REPLACEMENT TOTAL KNEE Left     Family History  Problem Relation Age of Onset  . Liver disease Mother   . Heart attack Father   . Cancer Father   . CAD Sister     Social History   Socioeconomic History  . Marital status: Married    Spouse name: Not on file  . Number of children: Not on file  . Years of education: Not on file  . Highest education level: Not on file  Occupational History  . Not on file  Tobacco Use  . Smoking status: Former Smoker    Quit date: 11/30/1994    Years since quitting: 25.2  . Smokeless tobacco: Never Used  Substance and Sexual Activity  .  Alcohol use: No    Alcohol/week: 21.0 - 35.0 standard drinks    Types: 21 - 35 Glasses of wine per week    Comment: 3-5 glasses of wine/ day  . Drug use: No  . Sexual activity: Not on file  Other Topics Concern  . Not on file  Social History Narrative  . Not on file   Social Determinants of Health   Financial Resource Strain:   . Difficulty of Paying Living Expenses: Not on file  Food Insecurity:   . Worried About Charity fundraiser in the Last Year: Not on file  . Ran Out of Food in the Last Year: Not on file  Transportation Needs:   . Lack of Transportation (Medical): Not on file  . Lack of Transportation (Non-Medical): Not on file  Physical Activity:   . Days of Exercise per Week: Not on file  . Minutes of Exercise per Session: Not on file  Stress:   . Feeling of Stress : Not on file  Social Connections:   . Frequency of Communication with Friends and Family: Not on file  . Frequency of Social Gatherings with Friends and Family: Not on file  . Attends Religious Services: Not on file  . Active Member of Clubs or Organizations:  Not on file  . Attends Archivist Meetings: Not on file  . Marital Status: Not on file  Intimate Partner Violence:   . Fear of Current or Ex-Partner: Not on file  . Emotionally Abused: Not on file  . Physically Abused: Not on file  . Sexually Abused: Not on file     Current Outpatient Medications:  .  acidophilus (RISAQUAD) CAPS capsule, Take 2 capsules 3 (three) times daily by mouth., Disp: , Rfl:  .  aspirin 81 MG tablet, Take 81 mg by mouth daily., Disp: , Rfl:  .  b complex vitamins capsule, Take by mouth., Disp: , Rfl:  .  calcium citrate-vitamin D (CITRACAL+D) 315-200 MG-UNIT tablet, Take by mouth., Disp: , Rfl:  .  Cholecalciferol (D-5000) 5000 units TABS, Take by mouth., Disp: , Rfl:  .  Copper Gluconate 2 MG CAPS, Take 4 mg by mouth daily., Disp: 60 capsule, Rfl: 3 .  FEROSUL 325 (65 Fe) MG tablet, TAKE 1 TABLET DAILY, Disp:  90 tablet, Rfl: 3 .  fluticasone (FLONASE) 50 MCG/ACT nasal spray, INSTILL 2 SPRAYS INTO EACH NOSTRIL DAILY, Disp: , Rfl:  .  loratadine (CLARITIN) 10 MG tablet, Take 1 tablet (10 mg total) by mouth daily., Disp: 30 tablet, Rfl: 11 .  Melatonin 5 MG TABS, Take by mouth., Disp: , Rfl:  .  multivitamin-lutein (OCUVITE-LUTEIN) CAPS capsule, Take 1 capsule daily by mouth., Disp: , Rfl: 0 .  spironolactone (ALDACTONE) 25 MG tablet, Take 1 tablet (25 mg total) by mouth daily., Disp: 90 tablet, Rfl: 3 .  thiamine 100 MG tablet, Take 1 tablet (100 mg total) daily by mouth., Disp: , Rfl:  .  vitamin B-12 500 MCG tablet, Take 1 tablet (500 mcg total) daily by mouth., Disp: , Rfl:    Allergies  Allergen Reactions  . Chlorpheniramine   . Penicillins Swelling    Has patient had a PCN reaction causing immediate rash, facial/tongue/throat swelling, SOB or lightheadedness with hypotension: Yes Has patient had a PCN reaction causing severe rash involving mucus membranes or skin necrosis: No Has patient had a PCN reaction that required hospitalization: No Has patient had a PCN reaction occurring within the last 10 years: No If all of the above answers are "NO", then may proceed with Cephalosporin use. Other reaction(s): SWELLING  . Other Itching and Other (See Comments)    "mycin" doesn't know which one "mycin" doesn't know which one    ROS Review of Systems  Constitutional: Positive for chills and fatigue.  HENT: Negative.  Negative for ear pain.   Eyes: Negative.  Negative for pain.  Respiratory: Negative.   Cardiovascular: Negative.  Negative for chest pain, palpitations and leg swelling.  Gastrointestinal: Positive for diarrhea. Negative for blood in stool and nausea.  Endocrine: Negative.   Genitourinary: Negative.  Negative for flank pain and pelvic pain.  Musculoskeletal: Negative.   Skin: Negative.   Allergic/Immunologic: Negative.   Neurological: Positive for numbness. Negative for  dizziness, syncope and headaches.  Hematological: Negative.   Psychiatric/Behavioral: Positive for dysphoric mood. Negative for behavioral problems. The patient is not hyperactive.   All other systems reviewed and are negative.     Objective:    Physical Exam Vitals reviewed.  Constitutional:      Appearance: Normal appearance. She is normal weight.  HENT:     Head: Normocephalic.     Right Ear: Tympanic membrane and ear canal normal.     Left Ear: Tympanic membrane and ear canal  normal.     Nose: Nose normal. No congestion.     Mouth/Throat:     Mouth: Mucous membranes are moist.     Pharynx: Oropharynx is clear.  Eyes:     General: No scleral icterus.    Pupils: Pupils are equal, round, and reactive to light.  Neck:     Vascular: No carotid bruit.  Cardiovascular:     Rate and Rhythm: Normal rate. Rhythm irregular.     Pulses: Normal pulses.     Heart sounds: Normal heart sounds. No murmur heard.   Pulmonary:     Effort: Pulmonary effort is normal. No respiratory distress.     Breath sounds: Normal breath sounds. No wheezing or rales.  Abdominal:     General: Bowel sounds are normal.     Palpations: Abdomen is soft. There is no hepatomegaly, splenomegaly or mass.     Tenderness: There is no abdominal tenderness.     Hernia: No hernia is present.  Musculoskeletal:        General: No tenderness.     Cervical back: Neck supple.     Right lower leg: No edema.     Left lower leg: No edema.  Skin:    Findings: No rash.  Neurological:     Mental Status: She is alert and oriented to person, place, and time.     Motor: No weakness.  Psychiatric:        Mood and Affect: Mood and affect normal.        Behavior: Behavior normal.     BP 134/78   Pulse 69   Ht 5\' 6"  (1.676 m)   Wt 153 lb 1.6 oz (69.4 kg)   BMI 24.71 kg/m  Wt Readings from Last 3 Encounters:  02/14/20 153 lb 1.6 oz (69.4 kg)  11/17/17 142 lb (64.4 kg)  08/11/17 135 lb 12.8 oz (61.6 kg)      Health Maintenance Due  Topic Date Due  . TETANUS/TDAP  Never done  . DEXA SCAN  Never done  . PNA vac Low Risk Adult (1 of 2 - PCV13) Never done    There are no preventive care reminders to display for this patient.  Lab Results  Component Value Date   TSH 1.05 02/15/2020   Lab Results  Component Value Date   WBC 6.0 02/15/2020   HGB 12.2 02/15/2020   HCT 39.0 02/15/2020   MCV 94.7 02/15/2020   PLT 280 02/15/2020   Lab Results  Component Value Date   NA 142 02/15/2020   K 4.1 02/15/2020   CO2 26 02/15/2020   GLUCOSE 76 02/15/2020   BUN 16 02/15/2020   CREATININE 0.68 02/15/2020   BILITOT 1.0 02/15/2020   ALKPHOS 53 04/10/2017   AST 22 02/15/2020   ALT 15 02/15/2020   PROT 6.0 (L) 02/15/2020   ALBUMIN 3.3 (L) 04/10/2017   CALCIUM 8.3 (L) 02/15/2020   ANIONGAP 8 04/10/2017   Lab Results  Component Value Date   CHOL 195 02/15/2020   Lab Results  Component Value Date   HDL 79 02/15/2020   Lab Results  Component Value Date   LDLCALC 97 02/15/2020   Lab Results  Component Value Date   TRIG 96 02/15/2020   Lab Results  Component Value Date   CHOLHDL 2.5 02/15/2020   No results found for: HGBA1C    Assessment & Plan:   Problem List Items Addressed This Visit      Cardiovascular and Mediastinum  Essential hypertension    Blood pressure is stable on present medication.  Patient was advised to follow DASH diet.        Respiratory   Seasonal allergic rhinitis due to pollen    Patient was advised to take Claritin 5 mg p.o. as needed for allergic rhinitis        Other   Thrombocytopenia ([facility])    Platelet count is 2 41,000 which is within normal range.      LFT elevation    Patient does not drink anymore.      Annual physical exam    Patient is doing well she denies any chest pain or shortness of breath.  Her hemoglobin is stable chest is clear, heart is regular abdomen is soft nontender.  Patient goes to the gym for the exercises       Anemia, macrocytic, nutritional    Hemoglobin was 12.3 recently and she is not having any bleeding      Need for COVID-19 vaccine - Primary    Patient suggest to have a Covid shot.      Relevant Orders   Pfizer SARS-COV-2 Vaccine (Completed)      No orders of the defined types were placed in this encounter.   Follow-up: No follow-ups on file.    Cletis Athens, MD

## 2020-02-15 ENCOUNTER — Ambulatory Visit (INDEPENDENT_AMBULATORY_CARE_PROVIDER_SITE_OTHER): Payer: Medicare Other | Admitting: Internal Medicine

## 2020-02-15 DIAGNOSIS — I10 Essential (primary) hypertension: Secondary | ICD-10-CM

## 2020-02-15 DIAGNOSIS — E785 Hyperlipidemia, unspecified: Secondary | ICD-10-CM

## 2020-02-15 DIAGNOSIS — D520 Dietary folate deficiency anemia: Secondary | ICD-10-CM

## 2020-02-16 LAB — LIPID PANEL
Cholesterol: 195 mg/dL (ref <200)
HDL: 79 mg/dL (ref >=50)
LDL Cholesterol (Calc): 97 mg/dL (calc)
Non-HDL Cholesterol (Calc): 116 mg/dL (calc) (ref <130)
Total CHOL/HDL Ratio: 2.5 (calc) (ref <5.0)
Triglycerides: 96 mg/dL (ref <150)

## 2020-02-16 LAB — CBC WITH DIFFERENTIAL/PLATELET
Absolute Monocytes: 696 cells/uL (ref 200–950)
Basophils Absolute: 48 cells/uL (ref 0–200)
Basophils Relative: 0.8 %
Eosinophils Absolute: 180 cells/uL (ref 15–500)
Eosinophils Relative: 3 %
HCT: 39 % (ref 35.0–45.0)
Hemoglobin: 12.2 g/dL (ref 11.7–15.5)
Lymphs Abs: 996 cells/uL (ref 850–3900)
MCH: 29.6 pg (ref 27.0–33.0)
MCHC: 31.3 g/dL — ABNORMAL LOW (ref 32.0–36.0)
MCV: 94.7 fL (ref 80.0–100.0)
MPV: 11 fL (ref 7.5–12.5)
Monocytes Relative: 11.6 %
Neutro Abs: 4080 cells/uL (ref 1500–7800)
Neutrophils Relative %: 68 %
Platelets: 280 10*3/uL (ref 140–400)
RBC: 4.12 10*6/uL (ref 3.80–5.10)
RDW: 11.9 % (ref 11.0–15.0)
Total Lymphocyte: 16.6 %
WBC: 6 10*3/uL (ref 3.8–10.8)

## 2020-02-16 LAB — COMPLETE METABOLIC PANEL WITH GFR
AG Ratio: 2.2 (calc) (ref 1.0–2.5)
ALT: 15 U/L (ref 6–29)
AST: 22 U/L (ref 10–35)
Albumin: 4.1 g/dL (ref 3.6–5.1)
Alkaline phosphatase (APISO): 60 U/L (ref 37–153)
BUN: 16 mg/dL (ref 7–25)
CO2: 26 mmol/L (ref 20–32)
Calcium: 8.3 mg/dL — ABNORMAL LOW (ref 8.6–10.4)
Chloride: 106 mmol/L (ref 98–110)
Creat: 0.68 mg/dL (ref 0.60–0.93)
GFR, Est African American: 97 mL/min/{1.73_m2} (ref >=60)
GFR, Est Non African American: 84 mL/min/{1.73_m2} (ref >=60)
Globulin: 1.9 g/dL (calc) (ref 1.9–3.7)
Glucose, Bld: 76 mg/dL (ref 65–99)
Potassium: 4.1 mmol/L (ref 3.5–5.3)
Sodium: 142 mmol/L (ref 135–146)
Total Bilirubin: 1 mg/dL (ref 0.2–1.2)
Total Protein: 6 g/dL — ABNORMAL LOW (ref 6.1–8.1)

## 2020-02-16 LAB — TSH: TSH: 1.05 mIU/L (ref 0.40–4.50)

## 2020-02-18 ENCOUNTER — Encounter: Payer: Self-pay | Admitting: Internal Medicine

## 2020-02-18 NOTE — Assessment & Plan Note (Signed)
Patient was advised to take Claritin 5 mg p.o. as needed for allergic rhinitis

## 2020-02-18 NOTE — Assessment & Plan Note (Signed)
Patient does not drink anymore. 

## 2020-02-18 NOTE — Assessment & Plan Note (Signed)
Blood pressure is stable on present medication.  Patient was advised to follow DASH diet.

## 2020-02-18 NOTE — Assessment & Plan Note (Signed)
Platelet count is 2 41,000 which is within normal range.

## 2020-02-18 NOTE — Assessment & Plan Note (Signed)
Patient is doing well she denies any chest pain or shortness of breath.  Her hemoglobin is stable chest is clear, heart is regular abdomen is soft nontender.  Patient goes to the gym for the exercises

## 2020-02-18 NOTE — Assessment & Plan Note (Signed)
Patient suggest to have a Covid shot.

## 2020-02-18 NOTE — Assessment & Plan Note (Signed)
Hemoglobin was 12.3 recently and she is not having any bleeding

## 2020-02-21 ENCOUNTER — Encounter: Payer: Self-pay | Admitting: Internal Medicine

## 2020-02-21 ENCOUNTER — Other Ambulatory Visit: Payer: Self-pay

## 2020-02-21 ENCOUNTER — Ambulatory Visit (INDEPENDENT_AMBULATORY_CARE_PROVIDER_SITE_OTHER): Payer: Medicare Other | Admitting: Internal Medicine

## 2020-02-21 VITALS — BP 132/84 | HR 82 | Ht 66.0 in | Wt 151.7 lb

## 2020-02-21 DIAGNOSIS — E785 Hyperlipidemia, unspecified: Secondary | ICD-10-CM | POA: Insufficient documentation

## 2020-02-21 DIAGNOSIS — I10 Essential (primary) hypertension: Secondary | ICD-10-CM | POA: Diagnosis not present

## 2020-02-21 DIAGNOSIS — K7040 Alcoholic hepatic failure without coma: Secondary | ICD-10-CM

## 2020-02-21 DIAGNOSIS — D696 Thrombocytopenia, unspecified: Secondary | ICD-10-CM

## 2020-02-21 NOTE — Assessment & Plan Note (Signed)
Blood pressure is stable on the present medication.  Patient denies any chest pain or shortness of breath.  Denies any history of swelling of the legs.  Chest is clear heart is regular.

## 2020-02-21 NOTE — Assessment & Plan Note (Signed)
Patient has a history of alcoholic liver failure her liver function test all normal albumin is okay.  We will get an ultrasound of the liver for screening purposes.

## 2020-02-21 NOTE — Progress Notes (Signed)
Established Patient Office Visit  Subjective:  Patient ID: Sonya Park, female    DOB: 06-25-1940  Age: 79 y.o. MRN: [id number]  CC:  Chief Complaint  Patient presents with  . lab results    HPI  Sonya Park presents for check up  Past Medical History:  Diagnosis Date  . Arthritis   . Back pain   . Insomnia   . Medical history non-contributory   . Osteoporosis     Past Surgical History:  Procedure Laterality Date  . ABDOMINAL HYSTERECTOMY    . BREAST LUMPECTOMY Right   . COLONOSCOPY WITH PROPOFOL N/A 07/18/2015   Procedure: COLONOSCOPY WITH PROPOFOL;  Surgeon: [name], MD;  Location: [facility] END[facility]COPY;  Service: Endoscopy;  Laterality: N/A;  . COSMETIC SURGERY    . GASTRIC BYPASS     X2  . HEMORROIDECTOMY    . KNEE ARTHROPLASTY Right 12/13/2015   Procedure: COMPUTER ASSISTED TOTAL KNEE ARTHROPLASTY;  Surgeon: Dereck Leep, MD;  Location: [facility] ORS;  Service: Orthopedics;  Laterality: Right;  . KNEE ARTHROSCOPY    . REPLACEMENT TOTAL KNEE Left     Family History  Problem Relation Age of Onset  . Liver disease Mother   . Heart attack Father   . Cancer Father   . CAD Sister     Social History   Socioeconomic History  . Marital status: Married    Spouse name: Not on file  . Number of children: Not on file  . Years of education: Not on file  . Highest education level: Not on file  Occupational History  . Not on file  Tobacco Use  . Smoking status: Former Smoker    Quit date: 11/30/1994    Years since quitting: 25.2  . Smokeless tobacco: Never Used  Substance and Sexual Activity  . Alcohol use: No    Alcohol/week: 21.0 - 35.0 standard drinks    Types: 21 - 35 Glasses of wine per week    Comment: 3-5 glasses of wine/ day  . Drug use: No  . Sexual activity: Not on file  Other Topics Concern  . Not on file  Social History Narrative  . Not on file   Social Determinants of Health   Financial Resource Strain:   . Difficulty of Paying Living  Expenses: Not on file  Food Insecurity:   . Worried About Charity fundraiser in the Last Year: Not on file  . Ran Out of Food in the Last Year: Not on file  Transportation Needs:   . Lack of Transportation (Medical): Not on file  . Lack of Transportation (Non-Medical): Not on file  Physical Activity:   . Days of Exercise per Week: Not on file  . Minutes of Exercise per Session: Not on file  Stress:   . Feeling of Stress : Not on file  Social Connections:   . Frequency of Communication with Friends and Family: Not on file  . Frequency of Social Gatherings with Friends and Family: Not on file  . Attends Religious Services: Not on file  . Active Member of Clubs or Organizations: Not on file  . Attends Archivist Meetings: Not on file  . Marital Status: Not on file  Intimate Partner Violence:   . Fear of Current or Ex-Partner: Not on file  . Emotionally Abused: Not on file  . Physically Abused: Not on file  . Sexually Abused: Not on file     Current Outpatient Medications:  .  acidophilus (RISAQUAD) CAPS capsule, Take 2 capsules 3 (three) times daily by mouth., Disp: , Rfl:  .  aspirin 81 MG tablet, Take 81 mg by mouth daily., Disp: , Rfl:  .  b complex vitamins capsule, Take by mouth., Disp: , Rfl:  .  calcium citrate-vitamin D (CITRACAL+D) 315-200 MG-UNIT tablet, Take by mouth., Disp: , Rfl:  .  Cholecalciferol (D-5000) 5000 units TABS, Take by mouth., Disp: , Rfl:  .  Copper Gluconate 2 MG CAPS, Take 4 mg by mouth daily., Disp: 60 capsule, Rfl: 3 .  FEROSUL 325 (65 Fe) MG tablet, TAKE 1 TABLET DAILY, Disp: 90 tablet, Rfl: 3 .  fluticasone (FLONASE) 50 MCG/ACT nasal spray, INSTILL 2 SPRAYS INTO EACH NOSTRIL DAILY, Disp: , Rfl:  .  loratadine (CLARITIN) 10 MG tablet, Take 1 tablet (10 mg total) by mouth daily., Disp: 30 tablet, Rfl: 11 .  Melatonin 5 MG TABS, Take by mouth., Disp: , Rfl:  .  multivitamin-lutein (OCUVITE-LUTEIN) CAPS capsule, Take 1 capsule daily by  mouth., Disp: , Rfl: 0 .  spironolactone (ALDACTONE) 25 MG tablet, Take 1 tablet (25 mg total) by mouth daily., Disp: 90 tablet, Rfl: 3 .  thiamine 100 MG tablet, Take 1 tablet (100 mg total) daily by mouth., Disp: , Rfl:  .  vitamin B-12 500 MCG tablet, Take 1 tablet (500 mcg total) daily by mouth., Disp: , Rfl:    Allergies  Allergen Reactions  . Chlorpheniramine   . Penicillins Swelling    Has patient had a PCN reaction causing immediate rash, facial/tongue/throat swelling, SOB or lightheadedness with hypotension: Yes Has patient had a PCN reaction causing severe rash involving mucus membranes or skin necrosis: No Has patient had a PCN reaction that required hospitalization: No Has patient had a PCN reaction occurring within the last 10 years: No If all of the above answers are "NO", then may proceed with Cephalosporin use. Other reaction(s): SWELLING  . Other Itching and Other (See Comments)    "mycin" doesn't know which one "mycin" doesn't know which one    ROS Review of Systems  Constitutional: Negative.   HENT: Negative.   Eyes: Negative.   Respiratory: Negative.   Cardiovascular: Negative.   Gastrointestinal: Negative.   Endocrine: Negative.   Genitourinary: Negative.   Musculoskeletal: Negative.   Skin: Negative.   Allergic/Immunologic: Negative.   Neurological: Negative.   Hematological: Negative.   Psychiatric/Behavioral: Negative.   All other systems reviewed and are negative.     Objective:    Physical Exam Vitals reviewed.  Constitutional:      Appearance: Normal appearance.  HENT:     Mouth/Throat:     Mouth: Mucous membranes are moist.  Eyes:     Pupils: Pupils are equal, round, and reactive to light.  Neck:     Vascular: No carotid bruit.  Cardiovascular:     Rate and Rhythm: Normal rate and regular rhythm.     Pulses: Normal pulses.     Heart sounds: Normal heart sounds.  Pulmonary:     Effort: Pulmonary effort is normal.     Breath sounds:  Normal breath sounds.  Abdominal:     General: Bowel sounds are normal.     Palpations: Abdomen is soft. There is no hepatomegaly, splenomegaly or mass.     Tenderness: There is no abdominal tenderness.     Hernia: No hernia is present.  Musculoskeletal:        General: No tenderness.     Cervical back:  Neck supple.     Right lower leg: No edema.     Left lower leg: No edema.  Skin:    Findings: No rash.  Neurological:     Mental Status: She is alert and oriented to person, place, and time.     Motor: No weakness.  Psychiatric:        Mood and Affect: Mood and affect normal.        Behavior: Behavior normal.     BP 132/84   Pulse 82   Ht 5\' 6"  (1.676 m)   Wt 151 lb 11.2 oz (68.8 kg)   BMI 24.49 kg/m  Wt Readings from Last 3 Encounters:  02/21/20 151 lb 11.2 oz (68.8 kg)  02/14/20 153 lb 1.6 oz (69.4 kg)  11/17/17 142 lb (64.4 kg)     Health Maintenance Due  Topic Date Due  . TETANUS/TDAP  Never done  . DEXA SCAN  Never done  . PNA vac Low Risk Adult (1 of 2 - PCV13) Never done    There are no preventive care reminders to display for this patient.  Lab Results  Component Value Date   TSH 1.05 02/15/2020   Lab Results  Component Value Date   WBC 6.0 02/15/2020   HGB 12.2 02/15/2020   HCT 39.0 02/15/2020   MCV 94.7 02/15/2020   PLT 280 02/15/2020   Lab Results  Component Value Date   NA 142 02/15/2020   K 4.1 02/15/2020   CO2 26 02/15/2020   GLUCOSE 76 02/15/2020   BUN 16 02/15/2020   CREATININE 0.68 02/15/2020   BILITOT 1.0 02/15/2020   ALKPHOS 53 04/10/2017   AST 22 02/15/2020   ALT 15 02/15/2020   PROT 6.0 (L) 02/15/2020   ALBUMIN 3.3 (L) 04/10/2017   CALCIUM 8.3 (L) 02/15/2020   ANIONGAP 8 04/10/2017   Lab Results  Component Value Date   CHOL 195 02/15/2020   Lab Results  Component Value Date   HDL 79 02/15/2020   Lab Results  Component Value Date   LDLCALC 97 02/15/2020   Lab Results  Component Value Date   TRIG 96 02/15/2020    Lab Results  Component Value Date   CHOLHDL 2.5 02/15/2020   No results found for: HGBA1C    Assessment & Plan:   Problem List Items Addressed This Visit      Cardiovascular and Mediastinum   Essential hypertension - Primary    Blood pressure is stable on the present medication.  Patient denies any chest pain or shortness of breath.  Denies any history of swelling of the legs.  Chest is clear heart is regular.        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  We will get an ultrasound of the liver for screening purposes.        Other   Thrombocytopenia ([place])    Recent lab test shows patient does not have any thrombocytopenia      Hyperlipidemia    Patient triglyceride cholesterol and HDL are within normal range.  She follows her diet.         No orders of the defined types were placed in this encounter.   Follow-up: No follow-ups on file.    Cletis Athens, MD

## 2020-02-21 NOTE — Assessment & Plan Note (Signed)
Recent lab test shows patient does not have any thrombocytopenia

## 2020-02-21 NOTE — Assessment & Plan Note (Signed)
Patient triglyceride cholesterol and HDL are within normal range.  She follows her diet.

## 2020-02-28 NOTE — Addendum Note (Signed)
Addended by: Alois Cliche on: 02/28/2020 02:36 PM   Modules accepted: Orders

## 2020-03-06 ENCOUNTER — Other Ambulatory Visit: Payer: Self-pay

## 2020-03-06 ENCOUNTER — Ambulatory Visit
Admission: RE | Admit: 2020-03-06 | Discharge: 2020-03-06 | Disposition: A | Discharge status: Home / Self Care | Payer: Medicare Other | Source: Ambulatory Visit | Attending: Internal Medicine | Admitting: Internal Medicine

## 2020-03-06 DIAGNOSIS — K7040 Alcoholic hepatic failure without coma: Secondary | ICD-10-CM | POA: Insufficient documentation

## 2020-03-10 ENCOUNTER — Ambulatory Visit (INDEPENDENT_AMBULATORY_CARE_PROVIDER_SITE_OTHER): Payer: Medicare Other | Admitting: Internal Medicine

## 2020-03-10 ENCOUNTER — Encounter: Payer: Self-pay | Admitting: Internal Medicine

## 2020-03-10 ENCOUNTER — Other Ambulatory Visit: Payer: Self-pay | Admitting: Physician Assistant

## 2020-03-10 ENCOUNTER — Other Ambulatory Visit: Payer: Self-pay

## 2020-03-10 VITALS — BP 135/75 | HR 70 | Ht 65.0 in | Wt 151.3 lb

## 2020-03-10 DIAGNOSIS — M810 Age-related osteoporosis without current pathological fracture: Secondary | ICD-10-CM | POA: Diagnosis not present

## 2020-03-10 DIAGNOSIS — D696 Thrombocytopenia, unspecified: Secondary | ICD-10-CM | POA: Diagnosis not present

## 2020-03-10 DIAGNOSIS — K7040 Alcoholic hepatic failure without coma: Secondary | ICD-10-CM | POA: Diagnosis not present

## 2020-03-10 DIAGNOSIS — I10 Essential (primary) hypertension: Secondary | ICD-10-CM

## 2020-03-11 ENCOUNTER — Encounter: Payer: Self-pay | Admitting: Internal Medicine

## 2020-03-11 NOTE — Assessment & Plan Note (Signed)
Liver function are stable at the present time.  She did not have any thrombocytopenia.  An ultrasound examination of the liver was unremarkable.

## 2020-03-11 NOTE — Assessment & Plan Note (Signed)
-   Today, the patient's blood pressure is well managed on on a low-salt diet and use Aldactone. - The patient will continue the current treatment regimen.  - I encouraged the patient to eat a low-sodium diet to help control blood pressure. - I encouraged the patient to live an active lifestyle and complete activities that increases heart rate to 85% target heart rate at least 5 times per week for one hour.

## 2020-03-11 NOTE — Assessment & Plan Note (Signed)
Patient has a history of traumatic fracture in the past she was advised to take vitamin D 1000 units p.o. daily.  She also take copper as advised by her liver doctor.

## 2020-03-11 NOTE — Assessment & Plan Note (Signed)
Thrombocytopenia has resolved.  Patient does not drink anymore alcohol.

## 2020-03-11 NOTE — Progress Notes (Signed)
Established Patient Office Visit  Subjective:  Patient ID: Sonya Park, female    DOB: 1940/07/29  Age: 79 y.o. MRN: [id number]  CC:  Chief Complaint  Patient presents with  . ultrasound results    HPI  Sonya Park presents for general checkup.,  She had an ultrasound of the liver done as an outpatient basis so she came up to get the report.  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  Past Medical History:  Diagnosis Date  . Arthritis   . Back pain   . Insomnia   . Medical history non-contributory   . Osteoporosis     Past Surgical History:  Procedure Laterality Date  . ABDOMINAL HYSTERECTOMY    . BREAST LUMPECTOMY Right   . COLONOSCOPY WITH PROPOFOL N/A 07/18/2015   Procedure: COLONOSCOPY WITH PROPOFOL;  Surgeon: [name], MD;  Location: [facility] END[facility];  Service: Endoscopy;  Laterality: N/A;  . COSMETIC SURGERY    . GASTRIC BYPASS     X2  . HEMORROIDECTOMY    . KNEE ARTHROPLASTY Right 12/13/2015   Procedure: COMPUTER ASSISTED TOTAL KNEE ARTHROPLASTY;  Surgeon: Dereck Leep, MD;  Location: [facility] ORS;  Service: Orthopedics;  Laterality: Right;  . KNEE ARTHROSCOPY    . REPLACEMENT TOTAL KNEE Left     Family History  Problem Relation Age of Onset  . Liver disease Mother   . Heart attack Father   . Cancer Father   . CAD Sister     Social History   Socioeconomic History  . Marital status: Married    Spouse name: Not on file  . Number of children: Not on file  . Years of education: Not on file  . Highest education level: Not on file  Occupational History  . Not on file  Tobacco Use  . Smoking status: Former Smoker    Quit date: 11/30/1994    Years since quitting: 25.2  . Smokeless tobacco: Never Used  Substance and Sexual Activity  . Alcohol use: No    Alcohol/week: 21.0 - 35.0 standard drinks    Types: 21 - 35 Glasses of wine per  week    Comment: 3-5 glasses of wine/ day  . Drug use: No  . Sexual activity: Not on file  Other Topics Concern  . Not on file  Social History Narrative  . Not on file   Social Determinants of Health   Financial Resource Strain:   . Difficulty of Paying Living Expenses: Not on file  Food Insecurity:   . Worried About Charity fundraiser in the Last Year: Not on file  . Ran Out of Food in the Last Year: Not on file  Transportation Needs:   . Lack of Transportation (Medical): Not on file  . Lack of Transportation (Non-Medical): Not on file  Physical Activity:   . Days of Exercise per Week: Not on file  . Minutes of Exercise per Session: Not on file  Stress:   . Feeling of Stress : Not on file  Social Connections:   . Frequency of Communication with Friends and Family: Not on file  . Frequency of Social Gatherings with Friends and Family: Not on file  . Attends Religious Services: Not on file  . Active Member of Clubs or Organizations: Not on file  . Attends Archivist Meetings: Not on file  .  Marital Status: Not on file  Intimate Partner Violence:   . Fear of Current or Ex-Partner: Not on file  . Emotionally Abused: Not on file  . Physically Abused: Not on file  . Sexually Abused: Not on file     Current Outpatient Medications:  .  acidophilus (RISAQUAD) CAPS capsule, Take 2 capsules 3 (three) times daily by mouth., Disp: , Rfl:  .  aspirin 81 MG tablet, Take 81 mg by mouth daily., Disp: , Rfl:  .  b complex vitamins capsule, Take by mouth., Disp: , Rfl:  .  calcium citrate-vitamin D (CITRACAL+D) 315-200 MG-UNIT tablet, Take by mouth., Disp: , Rfl:  .  Cholecalciferol (D-5000) 5000 units TABS, Take by mouth., Disp: , Rfl:  .  Copper Gluconate 2 MG CAPS, Take 4 mg by mouth daily., Disp: 60 capsule, Rfl: 3 .  FEROSUL 325 (65 Fe) MG tablet, TAKE 1 TABLET DAILY, Disp: 90 tablet, Rfl: 3 .  fluticasone (FLONASE) 50 MCG/ACT nasal spray, INSTILL 2 SPRAYS INTO EACH  NOSTRIL DAILY, Disp: , Rfl:  .  loratadine (CLARITIN) 10 MG tablet, Take 1 tablet (10 mg total) by mouth daily., Disp: 30 tablet, Rfl: 11 .  Melatonin 5 MG TABS, Take by mouth., Disp: , Rfl:  .  multivitamin-lutein (OCUVITE-LUTEIN) CAPS capsule, Take 1 capsule daily by mouth., Disp: , Rfl: 0 .  spironolactone (ALDACTONE) 25 MG tablet, Take 1 tablet (25 mg total) by mouth daily., Disp: 90 tablet, Rfl: 3 .  thiamine 100 MG tablet, Take 1 tablet (100 mg total) daily by mouth., Disp: , Rfl:  .  vitamin B-12 500 MCG tablet, Take 1 tablet (500 mcg total) daily by mouth., Disp: , Rfl:    Allergies  Allergen Reactions  . Chlorpheniramine   . Penicillins Swelling    Has patient had a PCN reaction causing immediate rash, facial/tongue/throat swelling, SOB or lightheadedness with hypotension: Yes Has patient had a PCN reaction causing severe rash involving mucus membranes or skin necrosis: No Has patient had a PCN reaction that required hospitalization: No Has patient had a PCN reaction occurring within the last 10 years: No If all of the above answers are "NO", then may proceed with Cephalosporin use. Other reaction(s): SWELLING  . Other Itching and Other (See Comments)    "mycin" doesn't know which one "mycin" doesn't know which one    ROS Review of Systems  Constitutional: Negative.   HENT: Negative.   Eyes: Negative.   Respiratory: Negative.   Cardiovascular: Negative.   Gastrointestinal: Negative.   Endocrine: Negative.   Genitourinary: Negative.   Musculoskeletal: Negative.   Skin: Negative.   Allergic/Immunologic: Negative.   Neurological: Negative.   Hematological: Negative.   Psychiatric/Behavioral: Negative.   All other systems reviewed and are negative.     Objective:    Physical Exam Vitals reviewed.  Constitutional:      Appearance: Normal appearance.  HENT:     Mouth/Throat:     Mouth: Mucous membranes are moist.  Eyes:     Pupils: Pupils are equal, round, and  reactive to light.  Neck:     Vascular: No carotid bruit.  Cardiovascular:     Rate and Rhythm: Normal rate and regular rhythm.     Pulses: Normal pulses.     Heart sounds: Normal heart sounds.  Pulmonary:     Effort: Pulmonary effort is normal.     Breath sounds: Normal breath sounds.  Abdominal:     General: Bowel sounds are normal.  Palpations: Abdomen is soft. There is no hepatomegaly, splenomegaly or mass.     Tenderness: There is no abdominal tenderness.     Hernia: No hernia is present.  Musculoskeletal:        General: No tenderness.     Cervical back: Neck supple.     Right lower leg: No edema.     Left lower leg: No edema.  Skin:    Findings: No rash.  Neurological:     Mental Status: She is alert and oriented to person, place, and time.     Motor: No weakness.  Psychiatric:        Mood and Affect: Mood and affect normal.        Behavior: Behavior normal.     BP 135/75   Pulse 70   Ht 5\' 5"  (1.651 m)   Wt 151 lb 4.8 oz (68.6 kg)   BMI 25.18 kg/m  Wt Readings from Last 3 Encounters:  03/10/20 151 lb 4.8 oz (68.6 kg)  02/21/20 151 lb 11.2 oz (68.8 kg)  02/14/20 153 lb 1.6 oz (69.4 kg)     Health Maintenance Due  Topic Date Due  . TETANUS/TDAP  Never done  . DEXA SCAN  Never done  . PNA vac Low Risk Adult (1 of 2 - PCV13) Never done    There are no preventive care reminders to display for this patient.  Lab Results  Component Value Date   TSH 1.05 02/15/2020   Lab Results  Component Value Date   WBC 6.0 02/15/2020   HGB 12.2 02/15/2020   HCT 39.0 02/15/2020   MCV 94.7 02/15/2020   PLT 280 02/15/2020   Lab Results  Component Value Date   NA 142 02/15/2020   K 4.1 02/15/2020   CO2 26 02/15/2020   GLUCOSE 76 02/15/2020   BUN 16 02/15/2020   CREATININE 0.68 02/15/2020   BILITOT 1.0 02/15/2020   ALKPHOS 53 04/10/2017   AST 22 02/15/2020   ALT 15 02/15/2020   PROT 6.0 (L) 02/15/2020   ALBUMIN 3.3 (L) 04/10/2017   CALCIUM 8.3 (L)  02/15/2020   ANIONGAP 8 04/10/2017   Lab Results  Component Value Date   CHOL 195 02/15/2020   Lab Results  Component Value Date   HDL 79 02/15/2020   Lab Results  Component Value Date   LDLCALC 97 02/15/2020   Lab Results  Component Value Date   TRIG 96 02/15/2020   Lab Results  Component Value Date   CHOLHDL 2.5 02/15/2020   No results found for: HGBA1C    Assessment & Plan:   Problem List Items Addressed This Visit      Cardiovascular and Mediastinum   Essential hypertension - Primary    - Today, the patient's blood pressure is well managed on on a low-salt diet and use Aldactone. - The patient will continue the current treatment regimen.  - I encouraged the patient to eat a low-sodium diet to help control blood pressure. - I encouraged the patient to live an active lifestyle and complete activities that increases heart rate to 85% target heart rate at least 5 times per week for one hour.            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  She did not have any thrombocytopenia.  An ultrasound examination of the liver was unremarkable.        Musculoskeletal and Integument  Osteoporosis    Patient has a history of traumatic fracture in the past she was advised to take vitamin D 1000 units p.o. daily.  She also take copper as advised by her liver doctor.        Other   RESOLVED: Thrombocytopenia ([facility])    Thrombocytopenia has resolved.  Patient does not drink anymore alcohol.         No orders of the defined types were placed in this encounter.   Follow-up: No follow-ups on file.    Cletis Athens, MD

## 2020-03-15 ENCOUNTER — Other Ambulatory Visit: Payer: Self-pay | Admitting: Physician Assistant

## 2020-03-15 DIAGNOSIS — M25519 Pain in unspecified shoulder: Secondary | ICD-10-CM

## 2020-03-20 ENCOUNTER — Other Ambulatory Visit: Payer: Self-pay | Admitting: *Deleted

## 2020-03-20 NOTE — Progress Notes (Signed)
am

## 2020-03-21 ENCOUNTER — Ambulatory Visit (INDEPENDENT_AMBULATORY_CARE_PROVIDER_SITE_OTHER): Payer: Medicare Other | Admitting: Internal Medicine

## 2020-03-21 ENCOUNTER — Encounter: Payer: Self-pay | Admitting: Internal Medicine

## 2020-03-21 ENCOUNTER — Other Ambulatory Visit: Payer: Self-pay

## 2020-03-21 VITALS — BP 174/93 | HR 84 | Ht 66.0 in | Wt 146.9 lb

## 2020-03-21 DIAGNOSIS — K7040 Alcoholic hepatic failure without coma: Secondary | ICD-10-CM

## 2020-03-21 DIAGNOSIS — I10 Essential (primary) hypertension: Secondary | ICD-10-CM

## 2020-03-21 DIAGNOSIS — D696 Thrombocytopenia, unspecified: Secondary | ICD-10-CM

## 2020-03-21 DIAGNOSIS — M12811 Other specific arthropathies, not elsewhere classified, right shoulder: Secondary | ICD-10-CM

## 2020-03-21 NOTE — Progress Notes (Signed)
Established Patient Office Visit  Subjective:  Patient ID: Sonya Park, female    DOB: 1940-11-06  Age: 79 y.o. MRN: [id number]  CC:  Chief Complaint  Patient presents with   Shoulder Pain    Patient complains of right shoulder pain     HPI  Sonya Park presents for right shoulder pain.  She want to see an orthopedic surgeon for further evaluation she cannot raise her right forearm above the shoulder.  There is no history of any recent injury.    Past Medical History:  Diagnosis Date   Arthritis    Back pain    Insomnia    Medical history non-contributory    Osteoporosis     Past Surgical History:  Procedure Laterality Date   ABDOMINAL HYSTERECTOMY     BREAST LUMPECTOMY Right    COLONOSCOPY WITH PROPOFOL N/A 07/18/2015   Procedure: COLONOSCOPY WITH PROPOFOL;  Surgeon: [name], MD;  Location: [facility];  Service: Endoscopy;  Laterality: N/A;   COSMETIC SURGERY     GASTRIC BYPASS     X2   HEMORROIDECTOMY     KNEE ARTHROPLASTY Right 12/13/2015   Procedure: COMPUTER ASSISTED TOTAL KNEE ARTHROPLASTY;  Surgeon: Dereck Leep, MD;  Location: [facility] ORS;  Service: Orthopedics;  Laterality: Right;   KNEE ARTHROSCOPY     REPLACEMENT TOTAL KNEE Left     Family History  Problem Relation Age of Onset   Liver disease Mother    Heart attack Father    Cancer Father    CAD Sister     Social History   Socioeconomic History   Marital status: Married    Spouse name: Not on file   Number of children: Not on file   Years of education: Not on file   Highest education level: Not on file  Occupational History   Not on file  Tobacco Use   Smoking status: Former Smoker    Quit date: 11/30/1994    Years since quitting: 25.3   Smokeless tobacco: Never Used  Substance and Sexual Activity   Alcohol use: No    Alcohol/week: 21.0 - 35.0 standard drinks    Types: 21 - 35 Glasses of wine per week    Comment: 3-5 glasses of wine/ day   Drug  use: No   Sexual activity: Not on file  Other Topics Concern   Not on file  Social History Narrative   Not on file   Social Determinants of Health   Financial Resource Strain: Not on file  Food Insecurity: Not on file  Transportation Needs: Not on file  Physical Activity: Not on file  Stress: Not on file  Social Connections: Not on file  Intimate Partner Violence: Not on file     Current Outpatient Medications:    acidophilus (RISAQUAD) CAPS capsule, Take 2 capsules 3 (three) times daily by mouth., Disp: , Rfl:    aspirin 81 MG tablet, Take 81 mg by mouth daily., Disp: , Rfl:    b complex vitamins capsule, Take by mouth., Disp: , Rfl:    calcium citrate-vitamin D (CITRACAL+D) 315-200 MG-UNIT tablet, Take by mouth., Disp: , Rfl:    Cholecalciferol 125 MCG (5000 UT) TABS, Take by mouth., Disp: , Rfl:    Copper Gluconate 2 MG CAPS, Take 4 mg by mouth daily., Disp: 60 capsule, Rfl: 3   FEROSUL 325 (65 Fe) MG tablet, TAKE 1 TABLET DAILY, Disp: 90 tablet, Rfl: 3   fluticasone (FLONASE) 50 MCG/ACT nasal  spray, INSTILL 2 SPRAYS INTO EACH NOSTRIL DAILY, Disp: , Rfl:    loratadine (CLARITIN) 10 MG tablet, Take 1 tablet (10 mg total) by mouth daily., Disp: 30 tablet, Rfl: 11   Melatonin 5 MG TABS, Take by mouth., Disp: , Rfl:    multivitamin-lutein (OCUVITE-LUTEIN) CAPS capsule, Take 1 capsule daily by mouth., Disp: , Rfl: 0   spironolactone (ALDACTONE) 25 MG tablet, Take 1 tablet (25 mg total) by mouth daily., Disp: 90 tablet, Rfl: 3   thiamine 100 MG tablet, Take 1 tablet (100 mg total) daily by mouth., Disp: , Rfl:    vitamin B-12 500 MCG tablet, Take 1 tablet (500 mcg total) daily by mouth., Disp: , Rfl:    Allergies  Allergen Reactions   Chlorpheniramine    Penicillins Swelling    Has patient had a PCN reaction causing immediate rash, facial/tongue/throat swelling, SOB or lightheadedness with hypotension: Yes Has patient had a PCN reaction causing severe rash  involving mucus membranes or skin necrosis: No Has patient had a PCN reaction that required hospitalization: No Has patient had a PCN reaction occurring within the last 10 years: No If all of the above answers are "NO", then may proceed with Cephalosporin use. Other reaction(s): SWELLING   Other Itching and Other (See Comments)    "mycin" doesn't know which one "mycin" doesn't know which one    ROS Review of Systems  Constitutional: Negative.   HENT: Negative.   Eyes: Negative.   Respiratory: Negative.   Cardiovascular: Negative.   Gastrointestinal: Negative.   Endocrine: Negative.   Genitourinary: Negative.   Musculoskeletal: Negative.   Skin: Negative.   Allergic/Immunologic: Negative.   Neurological: Negative.   Hematological: Negative.   Psychiatric/Behavioral: Negative.   All other systems reviewed and are negative.     Objective:    Physical Exam Vitals reviewed.  Constitutional:      Appearance: Normal appearance.  HENT:     Mouth/Throat:     Mouth: Mucous membranes are moist.  Eyes:     Pupils: Pupils are equal, round, and reactive to light.  Neck:     Vascular: No carotid bruit.  Cardiovascular:     Rate and Rhythm: Normal rate and regular rhythm.     Pulses: Normal pulses.     Heart sounds: Normal heart sounds.  Pulmonary:     Effort: Pulmonary effort is normal.     Breath sounds: Normal breath sounds.  Abdominal:     General: Bowel sounds are normal.     Palpations: Abdomen is soft. There is no hepatomegaly, splenomegaly or mass.     Tenderness: There is no abdominal tenderness.     Hernia: No hernia is present.  Musculoskeletal:        General: No tenderness.     Cervical back: Neck supple.     Right lower leg: No edema.     Left lower leg: No edema.  Skin:    Findings: No rash.  Neurological:     Mental Status: She is alert and oriented to person, place, and time.     Motor: No weakness.  Psychiatric:        Mood and Affect: Mood and  affect normal.        Behavior: Behavior normal.     BP (!) 174/93    Pulse 84    Ht 5\' 6"  (1.676 m)    Wt 146 lb 14.4 oz (66.6 kg)    BMI 23.71 kg/m  Wt Readings  from Last 3 Encounters:  03/21/20 146 lb 14.4 oz (66.6 kg)  03/10/20 151 lb 4.8 oz (68.6 kg)  02/21/20 151 lb 11.2 oz (68.8 kg)     Health Maintenance Due  Topic Date Due   TETANUS/TDAP  Never done   DEXA SCAN  Never done   PNA vac Low Risk Adult (1 of 2 - PCV13) Never done    There are no preventive care reminders to display for this patient.  Lab Results  Component Value Date   TSH 1.05 02/15/2020   Lab Results  Component Value Date   WBC 6.0 02/15/2020   HGB 12.2 02/15/2020   HCT 39.0 02/15/2020   MCV 94.7 02/15/2020   PLT 280 02/15/2020   Lab Results  Component Value Date   NA 142 02/15/2020   K 4.1 02/15/2020   CO2 26 02/15/2020   GLUCOSE 76 02/15/2020   BUN 16 02/15/2020   CREATININE 0.68 02/15/2020   BILITOT 1.0 02/15/2020   ALKPHOS 53 04/10/2017   AST 22 02/15/2020   ALT 15 02/15/2020   PROT 6.0 (L) 02/15/2020   ALBUMIN 3.3 (L) 04/10/2017   CALCIUM 8.3 (L) 02/15/2020   ANIONGAP 8 04/10/2017   Lab Results  Component Value Date   CHOL 195 02/15/2020   Lab Results  Component Value Date   HDL 79 02/15/2020   Lab Results  Component Value Date   LDLCALC 97 02/15/2020   Lab Results  Component Value Date   TRIG 96 02/15/2020   Lab Results  Component Value Date   CHOLHDL 2.5 02/15/2020   No results found for: HGBA1C    Assessment & Plan:   Problem List Items Addressed This Visit      Cardiovascular and Mediastinum   Essential hypertension - Primary    Blood pressure is stable on the present medication.  Patient is following low-cholesterol low-salt diet and exercise on a daily basis.        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  She was advised to have another ultrasound in a year.        Musculoskeletal and Integument   Rotator cuff arthropathy of right shoulder    Patient has developed right rotator cuff impingement syndrome she cannot raise the right arm above the right shoulder.  I will refer her to orthopedic surgeon for evaluation.        Other   Thrombocytopenia (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  She does not have any ecchymosis or rash.         No orders of the defined types were placed in this encounter.   Follow-up: No follow-ups on file.    Cletis Athens, MD

## 2020-03-22 ENCOUNTER — Encounter: Payer: Self-pay | Admitting: Internal Medicine

## 2020-03-22 DIAGNOSIS — M12811 Other specific arthropathies, not elsewhere classified, right shoulder: Secondary | ICD-10-CM | POA: Insufficient documentation

## 2020-03-22 NOTE — Assessment & Plan Note (Signed)
Blood pressure is stable on the present medication.  Patient is following low-cholesterol low-salt diet and exercise on a daily basis.

## 2020-03-22 NOTE — Assessment & Plan Note (Signed)
Patient has stopped drinking completely her liver function tests are stable at the present time recent ultrasound did not show any nodules in the liver.  She was advised to have another ultrasound in a year.

## 2020-03-22 NOTE — Assessment & Plan Note (Signed)
Patient has developed right rotator cuff impingement syndrome she cannot raise the right arm above the right shoulder.  I will refer her to orthopedic surgeon for evaluation.

## 2020-03-22 NOTE — Assessment & Plan Note (Signed)
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  She does not have any ecchymosis or rash.

## 2020-04-04 ENCOUNTER — Ambulatory Visit
Admission: RE | Admit: 2020-04-04 | Discharge: 2020-04-04 | Disposition: A | Discharge status: Home / Self Care | Payer: Medicare Other | Source: Ambulatory Visit | Attending: Physician Assistant | Admitting: Physician Assistant

## 2020-04-04 ENCOUNTER — Other Ambulatory Visit: Payer: Self-pay

## 2020-04-04 DIAGNOSIS — M25519 Pain in unspecified shoulder: Secondary | ICD-10-CM

## 2020-06-09 ENCOUNTER — Ambulatory Visit (INDEPENDENT_AMBULATORY_CARE_PROVIDER_SITE_OTHER): Payer: Medicare Other | Admitting: Family Medicine

## 2020-06-09 ENCOUNTER — Encounter: Payer: Self-pay | Admitting: Family Medicine

## 2020-06-09 ENCOUNTER — Ambulatory Visit: Payer: Medicare Other | Admitting: Family Medicine

## 2020-06-09 ENCOUNTER — Other Ambulatory Visit: Payer: Self-pay

## 2020-06-09 VITALS — BP 124/80 | HR 72 | Ht 66.0 in | Wt 148.9 lb

## 2020-06-09 DIAGNOSIS — R42 Dizziness and giddiness: Secondary | ICD-10-CM | POA: Diagnosis not present

## 2020-06-09 DIAGNOSIS — Z20822 Contact with and (suspected) exposure to covid-19: Secondary | ICD-10-CM | POA: Diagnosis not present

## 2020-06-09 LAB — POC COVID19 BINAXNOW: SARS Coronavirus 2 Ag: NEGATIVE

## 2020-06-09 MED ORDER — MECLIZINE HCL 12.5 MG PO TABS: 12.5000 mg | ORAL_TABLET | Freq: Three times a day (TID) | ORAL | 0 refills | Status: DC | PRN

## 2020-06-09 NOTE — Assessment & Plan Note (Signed)
Patient reports fatigue with Vertigo x 2 weeks. With allergy sx as well. No facial weakness or signs of CVA.

## 2020-06-09 NOTE — Progress Notes (Signed)
Established Patient Office Visit  SUBJECTIVE:  Subjective  Patient ID: Sonya Park, female    DOB: 1940/10/10  Age: 80 y.o. MRN: [id number]  CC:  Chief Complaint  Patient presents with  . URI    Patient complains of congestion, cough, sore throat and sinus pressure. Symptoms started yesterday.    HPI Sonya Park is a 80 y.o. female presenting today for     Past Medical History:  Diagnosis Date  . Arthritis   . Back pain   . Insomnia   . Medical history non-contributory   . Osteoporosis     Past Surgical History:  Procedure Laterality Date  . ABDOMINAL HYSTERECTOMY    . BREAST LUMPECTOMY Right   . COLONOSCOPY WITH PROPOFOL N/A 07/18/2015   Procedure: COLONOSCOPY WITH PROPOFOL;  Surgeon: [name], MD;  Location: [facility];  Service: Endoscopy;  Laterality: N/A;  . COSMETIC SURGERY    . GASTRIC BYPASS     X2  . HEMORROIDECTOMY    . KNEE ARTHROPLASTY Right 12/13/2015   Procedure: COMPUTER ASSISTED TOTAL KNEE ARTHROPLASTY;  Surgeon: Dereck Leep, MD;  Location: [facility] OR[facility];  Service: Orthopedics;  Laterality: Right;  . KNEE ARTHROSCOPY    . REPLACEMENT TOTAL KNEE Left     Family History  Problem Relation Age of Onset  . Liver disease Mother   . Heart attack Father   . Cancer Father   . CAD Sister     Social History   Socioeconomic History  . Marital status: Married    Spouse name: Not on file  . Number of children: Not on file  . Years of education: Not on file  . Highest education level: Not on file  Occupational History  . Not on file  Tobacco Use  . Smoking status: Former Smoker    Quit date: 11/30/1994    Years since quitting: 25.5  . Smokeless tobacco: Never Used  Substance and Sexual Activity  . Alcohol use: No    Alcohol/week: 21.0 - 35.0 standard drinks    Types: 21 - 35 Glasses of wine per week    Comment: 3-5 glasses of wine/ day  . Drug use: No  . Sexual activity: Not on file  Other Topics Concern  . Not on file  Social  History Narrative  . Not on file   Social Determinants of Health   Financial Resource Strain: Not on file  Food Insecurity: Not on file  Transportation Needs: Not on file  Physical Activity: Not on file  Stress: Not on file  Social Connections: Not on file  Intimate Partner Violence: Not on file     Current Outpatient Medications:  .  acidophilus (RISAQUAD) CAPS capsule, Take 2 capsules 3 (three) times daily by mouth., Disp: , Rfl:  .  aspirin 81 MG tablet, Take 81 mg by mouth daily., Disp: , Rfl:  .  b complex vitamins capsule, Take by mouth., Disp: , Rfl:  .  calcium citrate-vitamin D (CITRACAL+D) 315-200 MG-UNIT tablet, Take by mouth., Disp: , Rfl:  .  Cholecalciferol 125 MCG (5000 UT) TABS, Take by mouth., Disp: , Rfl:  .  Copper Gluconate 2 MG CAPS, Take 4 mg by mouth daily., Disp: 60 capsule, Rfl: 3 .  FEROSUL 325 (65 Fe) MG tablet, TAKE 1 TABLET DAILY, Disp: 90 tablet, Rfl: 3 .  fluticasone (FLONASE) 50 MCG/ACT nasal spray, INSTILL 2 SPRAYS INTO EACH NOSTRIL DAILY, Disp: , Rfl:  .  loratadine (CLARITIN) 10 MG tablet,  Take 1 tablet (10 mg total) by mouth daily., Disp: 30 tablet, Rfl: 11 .  Melatonin 5 MG TABS, Take by mouth., Disp: , Rfl:  .  multivitamin-lutein (OCUVITE-LUTEIN) CAPS capsule, Take 1 capsule daily by mouth., Disp: , Rfl: 0 .  spironolactone (ALDACTONE) 25 MG tablet, Take 1 tablet (25 mg total) by mouth daily., Disp: 90 tablet, Rfl: 3 .  thiamine 100 MG tablet, Take 1 tablet (100 mg total) daily by mouth., Disp: , Rfl:  .  vitamin B-12 500 MCG tablet, Take 1 tablet (500 mcg total) daily by mouth., Disp: , Rfl:    Allergies  Allergen Reactions  . Chlorpheniramine   . Penicillins Swelling    Has patient had a PCN reaction causing immediate rash, facial/tongue/throat swelling, SOB or lightheadedness with hypotension: Yes Has patient had a PCN reaction causing severe rash involving mucus membranes or skin necrosis: No Has patient had a PCN reaction that required  hospitalization: No Has patient had a PCN reaction occurring within the last 10 years: No If all of the above answers are "NO", then may proceed with Cephalosporin use. Other reaction(s): SWELLING  . Other Itching and Other (See Comments)    "mycin" doesn't know which one "mycin" doesn't know which one    ROS Review of Systems  Constitutional: Positive for fatigue.  HENT: Positive for rhinorrhea and sneezing.   Respiratory: Negative.   Cardiovascular: Negative.   Genitourinary: Negative.   Neurological: Positive for dizziness, facial asymmetry, light-headedness and headaches. Negative for speech difficulty.  Psychiatric/Behavioral: Negative.      OBJECTIVE:    Physical Exam HENT:     Right Ear: Tympanic membrane normal.     Left Ear: Tympanic membrane normal.     Mouth/Throat:     Mouth: Mucous membranes are dry.  Eyes:     Pupils: Pupils are equal, round, and reactive to light.  Cardiovascular:     Rate and Rhythm: Normal rate and regular rhythm.  Pulmonary:     Effort: Pulmonary effort is normal.  Musculoskeletal:        General: Normal range of motion.     Cervical back: Normal range of motion.  Skin:    General: Skin is warm.  Neurological:     Mental Status: She is alert.  Psychiatric:        Mood and Affect: Mood normal.     BP 124/80   Pulse 72   Ht 5\' 6"  (1.676 m)   Wt 148 lb 14.4 oz (67.5 kg)   BMI 24.03 kg/m  Wt Readings from Last 3 Encounters:  06/09/20 148 lb 14.4 oz (67.5 kg)  03/21/20 146 lb 14.4 oz (66.6 kg)  03/10/20 151 lb 4.8 oz (68.6 kg)    Health Maintenance Due  Topic Date Due  . TETANUS/TDAP  Never done  . DEXA SCAN  Never done  . PNA vac Low Risk Adult (1 of 2 - PCV13) Never done    There are no preventive care reminders to display for this patient.  CBC Latest Ref Rng & Units 02/15/2020 04/10/2017 02/26/2017  WBC 3.8 - 10.8 Thousand/uL 6.0 4.9 7.2  Hemoglobin 11.7 - 15.5 g/dL 12.2 12.3 11.7(L)  Hematocrit 35.0 - 45.0 % 39.0  37.4 36.3  Platelets 140 - 400 Thousand/uL 280 241 255   CMP Latest Ref Rng & Units 02/15/2020 04/10/2017 02/11/2017  Glucose 65 - 99 mg/dL 76 85 78  BUN 7 - 25 mg/dL 16 21(H) 9  Creatinine 0.60 - 0.93  mg/dL 0.68 0.50 <0.30(L)  Sodium 135 - 146 mmol/L 142 140 136  Potassium 3.5 - 5.3 mmol/L 4.1 3.6 4.2  Chloride 98 - 110 mmol/L 106 109 105  CO2 20 - 32 mmol/L 26 23 25   Calcium 8.6 - 10.4 mg/dL 8.3(L) 8.4(L) 8.0(L)  Total Protein 6.1 - 8.1 g/dL 6.0(L) 6.2(L) 4.9(L)  Total Bilirubin 0.2 - 1.2 mg/dL 1.0 0.5 1.5(H)  Alkaline Phos 38 - 126 U/L - 53 103  AST 10 - 35 U/L 22 34 44(H)  ALT 6 - 29 U/L 15 19 23     Lab Results  Component Value Date   TSH 1.05 02/15/2020   Lab Results  Component Value Date   ALBUMIN 3.3 (L) 04/10/2017   ANIONGAP 8 04/10/2017   Lab Results  Component Value Date   CHOL 195 02/15/2020   HDL 79 02/15/2020   LDLCALC 97 02/15/2020   CHOLHDL 2.5 02/15/2020   Lab Results  Component Value Date   TRIG 96 02/15/2020   No results found for: HGBA1C    ASSESSMENT & PLAN:   Problem List Items Addressed This Visit      Other   Vertigo    Patient reports fatigue with Vertigo x 2 weeks. With allergy sx as well. No facial weakness or signs of CVA.        Other Visit Diagnoses    Suspected COVID-19 virus infection    -  Primary   Relevant Orders   POC COVID-19 (Completed)      No orders of the defined types were placed in this encounter.     Follow-up: No follow-ups on file.    Beckie Salts, [place] [address], [place], [place] [address]

## 2020-06-12 ENCOUNTER — Ambulatory Visit: Payer: Medicare Other | Admitting: Internal Medicine

## 2020-06-15 ENCOUNTER — Other Ambulatory Visit: Payer: Self-pay | Admitting: *Deleted

## 2020-06-15 MED ORDER — DOXYCYCLINE MONOHYDRATE 100 MG PO TABS: 100.0000 mg | ORAL_TABLET | Freq: Two times a day (BID) | ORAL | 0 refills | Status: DC

## 2020-06-27 ENCOUNTER — Encounter: Payer: Self-pay | Admitting: Internal Medicine

## 2020-06-27 ENCOUNTER — Ambulatory Visit: Payer: Medicare Other | Admitting: Internal Medicine

## 2020-06-27 VITALS — BP 146/82 | HR 87 | Ht 66.0 in | Wt 143.6 lb

## 2020-06-27 DIAGNOSIS — J301 Allergic rhinitis due to pollen: Secondary | ICD-10-CM | POA: Diagnosis not present

## 2020-06-27 DIAGNOSIS — K7040 Alcoholic hepatic failure without coma: Secondary | ICD-10-CM

## 2020-06-27 DIAGNOSIS — M12811 Other specific arthropathies, not elsewhere classified, right shoulder: Secondary | ICD-10-CM | POA: Diagnosis not present

## 2020-06-27 DIAGNOSIS — I10 Essential (primary) hypertension: Secondary | ICD-10-CM | POA: Diagnosis not present

## 2020-06-27 NOTE — Assessment & Plan Note (Signed)

## 2020-06-27 NOTE — Assessment & Plan Note (Signed)
Patient liver function tests are stable.  She does not have ascites.  There is no pedal edema.  Patient does not drink anymore.  She is going to the gym twice a week.

## 2020-06-27 NOTE — Assessment & Plan Note (Signed)
Allergies stable at the present time.

## 2020-06-27 NOTE — Assessment & Plan Note (Signed)
Patient was suggested rotator cuff exercises.

## 2020-06-27 NOTE — Progress Notes (Signed)
Established Patient Office Visit  Subjective:  Patient ID: Sonya Park, female    DOB: 05-08-1940  Age: 80 y.o. MRN: [id number]  CC:  Chief Complaint  Patient presents with  . Hypertension    HPI  Sonya Park presents for dizziness vertigo and sinus problem.  Patient also known to have cirrhosis of liver Her other problem include essential hypertension allergic rhinitis alcoholic liver disease dysphagia and osteoporosis.  She does not drink or smoke anymore  Past Medical History:  Diagnosis Date  . Arthritis   . Back pain   . Insomnia   . Medical history non-contributory   . Osteoporosis     Past Surgical History:  Procedure Laterality Date  . ABDOMINAL HYSTERECTOMY    . BREAST LUMPECTOMY Right   . COLONOSCOPY WITH PROPOFOL N/A 07/18/2015   Procedure: COLONOSCOPY WITH PROPOFOL;  Surgeon: [name], MD;  Location: [facility];  Service: Endoscopy;  Laterality: N/A;  . COSMETIC SURGERY    . GASTRIC BYPASS     X2  . HEMORROIDECTOMY    . KNEE ARTHROPLASTY Right 12/13/2015   Procedure: COMPUTER ASSISTED TOTAL KNEE ARTHROPLASTY;  Surgeon: Dereck Leep, MD;  Location: [facility] ORS;  Service: Orthopedics;  Laterality: Right;  . KNEE ARTHROSCOPY    . REPLACEMENT TOTAL KNEE Left     Family History  Problem Relation Age of Onset  . Liver disease Mother   . Heart attack Father   . Cancer Father   . CAD Sister     Social History   Socioeconomic History  . Marital status: Married    Spouse name: Not on file  . Number of children: Not on file  . Years of education: Not on file  . Highest education level: Not on file  Occupational History  . Not on file  Tobacco Use  . Smoking status: Former Smoker    Quit date: 11/30/1994    Years since quitting: 25.5  . Smokeless tobacco: Never Used  Substance and Sexual Activity  . Alcohol use: No    Alcohol/week: 21.0 - 35.0 standard drinks    Types: 21 - 35 Glasses of wine per week    Comment: 3-5 glasses of wine/ day   . Drug use: No  . Sexual activity: Not on file  Other Topics Concern  . Not on file  Social History Narrative  . Not on file   Social Determinants of Health   Financial Resource Strain: Not on file  Food Insecurity: Not on file  Transportation Needs: Not on file  Physical Activity: Not on file  Stress: Not on file  Social Connections: Not on file  Intimate Partner Violence: Not on file     Current Outpatient Medications:  .  acidophilus (RISAQUAD) CAPS capsule, Take 2 capsules 3 (three) times daily by mouth., Disp: , Rfl:  .  aspirin 81 MG tablet, Take 81 mg by mouth daily., Disp: , Rfl:  .  b complex vitamins capsule, Take by mouth., Disp: , Rfl:  .  calcium citrate-vitamin D (CITRACAL+D) 315-200 MG-UNIT tablet, Take by mouth., Disp: , Rfl:  .  Cholecalciferol 125 MCG (5000 UT) TABS, Take by mouth., Disp: , Rfl:  .  Copper Gluconate 2 MG CAPS, Take 4 mg by mouth daily., Disp: 60 capsule, Rfl: 3 .  doxycycline (ADOXA) 100 MG tablet, Take 1 tablet (100 mg total) by mouth 2 (two) times daily., Disp: 14 tablet, Rfl: 0 .  FEROSUL 325 (65 Fe) MG tablet, TAKE  1 TABLET DAILY, Disp: 90 tablet, Rfl: 3 .  fluticasone (FLONASE) 50 MCG/ACT nasal spray, INSTILL 2 SPRAYS INTO EACH NOSTRIL DAILY, Disp: , Rfl:  .  loratadine (CLARITIN) 10 MG tablet, Take 1 tablet (10 mg total) by mouth daily., Disp: 30 tablet, Rfl: 11 .  meclizine (ANTIVERT) 12.5 MG tablet, Take 1 tablet (12.5 mg total) by mouth 3 (three) times daily as needed for dizziness., Disp: 30 tablet, Rfl: 0 .  Melatonin 5 MG TABS, Take by mouth., Disp: , Rfl:  .  multivitamin-lutein (OCUVITE-LUTEIN) CAPS capsule, Take 1 capsule daily by mouth., Disp: , Rfl: 0 .  spironolactone (ALDACTONE) 25 MG tablet, Take 1 tablet (25 mg total) by mouth daily., Disp: 90 tablet, Rfl: 3 .  thiamine 100 MG tablet, Take 1 tablet (100 mg total) daily by mouth., Disp: , Rfl:  .  vitamin B-12 500 MCG tablet, Take 1 tablet (500 mcg total) daily by mouth.,  Disp: , Rfl:    Allergies  Allergen Reactions  . Chlorpheniramine   . Penicillins Swelling    Has patient had a PCN reaction causing immediate rash, facial/tongue/throat swelling, SOB or lightheadedness with hypotension: Yes Has patient had a PCN reaction causing severe rash involving mucus membranes or skin necrosis: No Has patient had a PCN reaction that required hospitalization: No Has patient had a PCN reaction occurring within the last 10 years: No If all of the above answers are "NO", then may proceed with Cephalosporin use. Other reaction(s): SWELLING  . Other Itching and Other (See Comments)    "mycin" doesn't know which one "mycin" doesn't know which one    ROS Review of Systems  Constitutional: Negative.  Negative for chills.  HENT: Positive for postnasal drip. Negative for sinus pain, sneezing and trouble swallowing.   Eyes: Negative.   Respiratory: Positive for cough.   Cardiovascular: Negative.   Gastrointestinal: Negative.   Endocrine: Negative.   Genitourinary: Negative.   Musculoskeletal: Negative.  Negative for back pain.  Skin: Negative.   Allergic/Immunologic: Negative.   Neurological: Negative.  Negative for light-headedness and headaches.  Hematological: Negative.   Psychiatric/Behavioral: Negative.  Negative for behavioral problems.  All other systems reviewed and are negative.     Objective:    Physical Exam Vitals reviewed.  Constitutional:      Appearance: Normal appearance.  HENT:     Mouth/Throat:     Mouth: Mucous membranes are moist.  Eyes:     Pupils: Pupils are equal, round, and reactive to light.  Neck:     Vascular: No carotid bruit.  Cardiovascular:     Rate and Rhythm: Normal rate and regular rhythm.     Pulses: Normal pulses.     Heart sounds: Normal heart sounds.  Pulmonary:     Effort: Pulmonary effort is normal.     Breath sounds: Normal breath sounds.  Abdominal:     General: Bowel sounds are normal.     Palpations:  Abdomen is soft. There is no hepatomegaly, splenomegaly or mass.     Tenderness: There is no abdominal tenderness.     Hernia: No hernia is present.  Musculoskeletal:        General: No tenderness.     Cervical back: Neck supple.     Right lower leg: No edema.     Left lower leg: No edema.  Skin:    Findings: No rash.  Neurological:     Mental Status: She is alert and oriented to person, place, and time.  Motor: No weakness.  Psychiatric:        Mood and Affect: Mood and affect normal.        Behavior: Behavior normal.     BP (!) 146/82   Pulse 87   Ht 5\' 6"  (1.676 m)   Wt 143 lb 9.6 oz (65.1 kg)   BMI 23.18 kg/m  Wt Readings from Last 3 Encounters:  06/27/20 143 lb 9.6 oz (65.1 kg)  06/09/20 148 lb 14.4 oz (67.5 kg)  03/21/20 146 lb 14.4 oz (66.6 kg)     Health Maintenance Due  Topic Date Due  . TETANUS/TDAP  Never done  . DEXA SCAN  Never done  . PNA vac Low Risk Adult (1 of 2 - PCV13) Never done    There are no preventive care reminders to display for this patient.  Lab Results  Component Value Date   TSH 1.05 02/15/2020   Lab Results  Component Value Date   WBC 6.0 02/15/2020   HGB 12.2 02/15/2020   HCT 39.0 02/15/2020   MCV 94.7 02/15/2020   PLT 280 02/15/2020   Lab Results  Component Value Date   NA 142 02/15/2020   K 4.1 02/15/2020   CO2 26 02/15/2020   GLUCOSE 76 02/15/2020   BUN 16 02/15/2020   CREATININE 0.68 02/15/2020   BILITOT 1.0 02/15/2020   ALKPHOS 53 04/10/2017   AST 22 02/15/2020   ALT 15 02/15/2020   PROT 6.0 (L) 02/15/2020   ALBUMIN 3.3 (L) 04/10/2017   CALCIUM 8.3 (L) 02/15/2020   ANIONGAP 8 04/10/2017   Lab Results  Component Value Date   CHOL 195 02/15/2020   Lab Results  Component Value Date   HDL 79 02/15/2020   Lab Results  Component Value Date   LDLCALC 97 02/15/2020   Lab Results  Component Value Date   TRIG 96 02/15/2020   Lab Results  Component Value Date   CHOLHDL 2.5 02/15/2020   No results  found for: HGBA1C    Assessment & Plan:   Problem List Items Addressed This Visit      Cardiovascular and Mediastinum   Essential hypertension - Primary    Patient blood pressure is normal patient denies any chest pain or shortness of breath there is no history of palpitation paroxysmal nocturnal dyspnea patient can walkioo yards without any problem patient was advised to follow low-salt low-cholesterol diet  I reviewed the results of Sprint trial  ideally I want to keep systolic blood pressure below 130 mmHg, patient was asked to check blood pressure 3 times a week and give me a report on that.  Patient will be follow-up in 3 months, patient will call me back for any change in the cardiovascular symptoms           Respiratory   Seasonal allergic rhinitis due to pollen    Allergies stable at the present time.        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)    Patient liver function tests are stable.  She does not have ascites.  There is no pedal edema.  Patient does not drink anymore.  She is going to the gym twice a week.        Musculoskeletal and Integument   Rotator cuff arthropathy of right shoulder    Patient was suggested rotator cuff exercises.       Patient allergies stable now, blood pressure is under control ,does not have any dysphagia.  She was advised  to take calcium daily  No orders of the defined types were placed in this encounter.   Follow-up: No follow-ups on file.    Cletis Athens, MD

## 2020-07-20 ENCOUNTER — Encounter: Payer: Self-pay | Admitting: Internal Medicine

## 2020-07-20 ENCOUNTER — Other Ambulatory Visit: Payer: Self-pay

## 2020-07-20 ENCOUNTER — Ambulatory Visit: Payer: Medicare Other | Admitting: Internal Medicine

## 2020-07-20 VITALS — BP 117/78 | HR 87 | Ht 66.0 in | Wt 142.0 lb

## 2020-07-20 DIAGNOSIS — D649 Anemia, unspecified: Secondary | ICD-10-CM

## 2020-07-20 DIAGNOSIS — M12811 Other specific arthropathies, not elsewhere classified, right shoulder: Secondary | ICD-10-CM

## 2020-07-20 DIAGNOSIS — K7040 Alcoholic hepatic failure without coma: Secondary | ICD-10-CM | POA: Diagnosis not present

## 2020-07-20 DIAGNOSIS — I10 Essential (primary) hypertension: Secondary | ICD-10-CM

## 2020-07-20 DIAGNOSIS — F1010 Alcohol abuse, uncomplicated: Secondary | ICD-10-CM

## 2020-07-20 DIAGNOSIS — J301 Allergic rhinitis due to pollen: Secondary | ICD-10-CM | POA: Diagnosis not present

## 2020-07-20 NOTE — Assessment & Plan Note (Signed)
Patient is improving on physical therapy

## 2020-07-20 NOTE — Assessment & Plan Note (Signed)
Patient was advised to take antihistamine as needed for allergies

## 2020-07-20 NOTE — Assessment & Plan Note (Signed)
Stable at the present time. 

## 2020-07-20 NOTE — Assessment & Plan Note (Signed)
None at the present time.

## 2020-07-20 NOTE — Assessment & Plan Note (Signed)

## 2020-07-20 NOTE — Progress Notes (Signed)
Established Patient Office Visit  Subjective:  Patient ID: Sonya Park, female    DOB: [birth year]  Age: 80 y.o. MRN: [id number]  CC:  Chief Complaint  Patient presents with  . Knee Pain    HPI  Sonya Park  Complains of weakness of the both legs.  She has a neuropathy of both legs.  Also complaining of some clicking of both knee joints.  She has bilateral knee replacement.  There is no history of swelling of the legs peripheral circulation is intact.  She is known to have osteoporosis, history of alcohol abuse in the past also has a right rotator cuff repair.  She does not drink a lot of water.  We will check her electrolytes on today's visit. Past Medical History:  Diagnosis Date  . Arthritis   . Back pain   . Insomnia   . Medical history non-contributory   . Osteoporosis     Past Surgical History:  Procedure Laterality Date  . ABDOMINAL HYSTERECTOMY    . BREAST LUMPECTOMY Right   . COLONOSCOPY WITH PROPOFOL N/A 07/18/2015   Procedure: COLONOSCOPY WITH PROPOFOL;  Surgeon: [name], MD;  Location: [facility];  Service: Endoscopy;  Laterality: N/A;  . COSMETIC SURGERY    . GASTRIC BYPASS     X2  . HEMORROIDECTOMY    . KNEE ARTHROPLASTY Right 12/13/2015   Procedure: COMPUTER ASSISTED TOTAL KNEE ARTHROPLASTY;  Surgeon: Dereck Leep, MD;  Location: [facility] ORS;  Service: Orthopedics;  Laterality: Right;  . KNEE ARTHROSCOPY    . REPLACEMENT TOTAL KNEE Left     Family History  Problem Relation Age of Onset  . Liver disease Mother   . Heart attack Father   . Cancer Father   . CAD Sister     Social History   Socioeconomic History  . Marital status: Married    Spouse name: Not on file  . Number of children: Not on file  . Years of education: Not on file  . Highest education level: Not on file  Occupational History  . Not on file  Tobacco Use  . Smoking status: Former Smoker    Quit date: 11/30/1994    Years since quitting: 25.6  . Smokeless tobacco:  Never Used  Substance and Sexual Activity  . Alcohol use: No    Alcohol/week: 21.0 - 35.0 standard drinks    Types: 21 - 35 Glasses of wine per week    Comment: 3-5 glasses of wine/ day  . Drug use: No  . Sexual activity: Not on file  Other Topics Concern  . Not on file  Social History Narrative  . Not on file   Social Determinants of Health   Financial Resource Strain: Not on file  Food Insecurity: Not on file  Transportation Needs: Not on file  Physical Activity: Not on file  Stress: Not on file  Social Connections: Not on file  Intimate Partner Violence: Not on file     Current Outpatient Medications:  .  acidophilus (RISAQUAD) CAPS capsule, Take 2 capsules 3 (three) times daily by mouth., Disp: , Rfl:  .  aspirin 81 MG tablet, Take 81 mg by mouth daily., Disp: , Rfl:  .  b complex vitamins capsule, Take by mouth., Disp: , Rfl:  .  calcium citrate-vitamin D (CITRACAL+D) 315-200 MG-UNIT tablet, Take by mouth., Disp: , Rfl:  .  Cholecalciferol 125 MCG (5000 UT) TABS, Take by mouth., Disp: , Rfl:  .  Copper Gluconate  2 MG CAPS, Take 4 mg by mouth daily., Disp: 60 capsule, Rfl: 3 .  doxycycline (ADOXA) 100 MG tablet, Take 1 tablet (100 mg total) by mouth 2 (two) times daily., Disp: 14 tablet, Rfl: 0 .  FEROSUL 325 (65 Fe) MG tablet, TAKE 1 TABLET DAILY, Disp: 90 tablet, Rfl: 3 .  fluticasone (FLONASE) 50 MCG/ACT nasal spray, INSTILL 2 SPRAYS INTO EACH NOSTRIL DAILY, Disp: , Rfl:  .  loratadine (CLARITIN) 10 MG tablet, Take 1 tablet (10 mg total) by mouth daily., Disp: 30 tablet, Rfl: 11 .  meclizine (ANTIVERT) 12.5 MG tablet, Take 1 tablet (12.5 mg total) by mouth 3 (three) times daily as needed for dizziness., Disp: 30 tablet, Rfl: 0 .  Melatonin 5 MG TABS, Take by mouth., Disp: , Rfl:  .  multivitamin-lutein (OCUVITE-LUTEIN) CAPS capsule, Take 1 capsule daily by mouth., Disp: , Rfl: 0 .  spironolactone (ALDACTONE) 25 MG tablet, Take 1 tablet (25 mg total) by mouth daily.,  Disp: 90 tablet, Rfl: 3 .  thiamine 100 MG tablet, Take 1 tablet (100 mg total) daily by mouth., Disp: , Rfl:  .  vitamin B-12 500 MCG tablet, Take 1 tablet (500 mcg total) daily by mouth., Disp: , Rfl:    Allergies  Allergen Reactions  . Chlorpheniramine   . Penicillins Swelling    Has patient had a PCN reaction causing immediate rash, facial/tongue/throat swelling, SOB or lightheadedness with hypotension: Yes Has patient had a PCN reaction causing severe rash involving mucus membranes or skin necrosis: No Has patient had a PCN reaction that required hospitalization: No Has patient had a PCN reaction occurring within the last 10 years: No If all of the above answers are "NO", then may proceed with Cephalosporin use. Other reaction(s): SWELLING  . Other Itching and Other (See Comments)    "mycin" doesn't know which one "mycin" doesn't know which one    ROS Review of Systems  Constitutional: Negative.   HENT: Negative.   Eyes: Negative.   Respiratory: Negative.   Cardiovascular: Negative.   Gastrointestinal: Negative.   Endocrine: Negative.   Genitourinary: Negative.  Negative for urgency.  Musculoskeletal: Positive for arthralgias. Negative for back pain, gait problem, joint swelling and myalgias.  Skin: Negative.   Allergic/Immunologic: Negative.   Neurological: Negative.  Negative for seizures, speech difficulty, light-headedness, numbness and headaches.  Hematological: Negative.   Psychiatric/Behavioral: Positive for decreased concentration.  All other systems reviewed and are negative.     Objective:    Physical Exam Pulmonary:     Breath sounds: Normal breath sounds.  Chest:  Breasts:     Right: No supraclavicular adenopathy.     Left: No supraclavicular adenopathy.    Musculoskeletal:     Right shoulder: Decreased range of motion.     Left shoulder: No tenderness.     Right elbow: No swelling or deformity.  Lymphadenopathy:     Head:     Right side of  head: No submandibular adenopathy.     Left side of head: No submandibular adenopathy.     Cervical:     Right cervical: No superficial cervical adenopathy.    Left cervical: No superficial cervical adenopathy.     Upper Body:     Right upper body: No supraclavicular adenopathy.     Left upper body: No supraclavicular adenopathy.  Skin:    Coloration: Skin is not jaundiced.  Neurological:     Mental Status: She is alert and oriented to person, place, and time.  Mental status is at baseline.     Motor: No seizure activity.     Deep Tendon Reflexes:     Reflex Scores:      Patellar reflexes are 1+ on the right side and 1+ on the left side. Psychiatric:        Mood and Affect: Mood normal. Mood is not anxious.        Speech: Speech normal.        Behavior: Behavior normal.        Thought Content: Thought content normal.        Cognition and Memory: Cognition and memory normal.     BP 117/78   Pulse 87   Ht 5\' 6"  (1.676 m)   Wt 142 lb (64.4 kg)   BMI 22.92 kg/m  Wt Readings from Last 3 Encounters:  07/20/20 142 lb (64.4 kg)  06/27/20 143 lb 9.6 oz (65.1 kg)  06/09/20 148 lb 14.4 oz (67.5 kg)     Health Maintenance Due  Topic Date Due  . TETANUS/TDAP  Never done  . DEXA SCAN  Never done  . PNA vac Low Risk Adult (1 of 2 - PCV13) Never done  . COLONOSCOPY (Pts 45-75yrs Insurance coverage will need to be confirmed)  07/17/2020    There are no preventive care reminders to display for this patient.  Lab Results  Component Value Date   TSH 1.05 02/15/2020   Lab Results  Component Value Date   WBC 6.0 02/15/2020   HGB 12.2 02/15/2020   HCT 39.0 02/15/2020   MCV 94.7 02/15/2020   PLT 280 02/15/2020   Lab Results  Component Value Date   NA 142 02/15/2020   K 4.1 02/15/2020   CO2 26 02/15/2020   GLUCOSE 76 02/15/2020   BUN 16 02/15/2020   CREATININE 0.68 02/15/2020   BILITOT 1.0 02/15/2020   ALKPHOS 53 04/10/2017   AST 22 02/15/2020   ALT 15 02/15/2020   PROT  6.0 (L) 02/15/2020   ALBUMIN 3.3 (L) 04/10/2017   CALCIUM 8.3 (L) 02/15/2020   ANIONGAP 8 04/10/2017   Lab Results  Component Value Date   CHOL 195 02/15/2020   Lab Results  Component Value Date   HDL 79 02/15/2020   Lab Results  Component Value Date   LDLCALC 97 02/15/2020   Lab Results  Component Value Date   TRIG 96 02/15/2020   Lab Results  Component Value Date   CHOLHDL 2.5 02/15/2020   No results found for: HGBA1C    Assessment & Plan:   Problem List Items Addressed This Visit      Cardiovascular and Mediastinum   Essential hypertension    Patient blood pressure is normal patient denies any chest pain or shortness of breath there is no history of palpitation paroxysmal nocturnal dyspnea patient can walk  25yards without any problem patient was advised to follow low-salt low-cholesterol diet  I reviewed the results of Sprint trial  ideally I want to keep systolic blood pressure below 130 mmHg, patient was asked to check blood pressure 3 times a week and give me a report on that.  Patient will be follow-up in 3 months, patient will call me back for any change in the cardiovascular symptoms           Respiratory   Seasonal allergic rhinitis due to pollen    Patient was advised to take antihistamine as needed for allergies        Digestive   Alcoholic  liver failure (HCC)    Stable at the present time.        Musculoskeletal and Integument   Rotator cuff arthropathy of right shoulder    Patient is improving on physical therapy        Other   Alcohol abuse    None at the present time.       Other Visit Diagnoses    Anemia, unspecified type    -  Primary   Relevant Orders   Electrolyte panel      No orders of the defined types were placed in this encounter.   Follow-up: No follow-ups on file.    Cletis Athens, MD

## 2020-07-21 LAB — ELECTROLYTE PANEL
CO2: 25 mmol/L (ref 20–32)
Chloride: 106 mmol/L (ref 98–110)
Potassium: 4.1 mmol/L (ref 3.5–5.3)
Sodium: 141 mmol/L (ref 135–146)

## 2020-07-25 ENCOUNTER — Encounter: Payer: Self-pay | Admitting: Internal Medicine

## 2020-07-25 ENCOUNTER — Other Ambulatory Visit: Payer: Self-pay

## 2020-07-25 ENCOUNTER — Ambulatory Visit: Payer: Medicare Other | Admitting: Internal Medicine

## 2020-07-25 VITALS — BP 118/77 | HR 72 | Ht 66.0 in | Wt 142.3 lb

## 2020-07-25 DIAGNOSIS — R1310 Dysphagia, unspecified: Secondary | ICD-10-CM

## 2020-07-25 DIAGNOSIS — J301 Allergic rhinitis due to pollen: Secondary | ICD-10-CM

## 2020-07-25 DIAGNOSIS — E43 Unspecified severe protein-calorie malnutrition: Secondary | ICD-10-CM

## 2020-07-25 DIAGNOSIS — K7040 Alcoholic hepatic failure without coma: Secondary | ICD-10-CM | POA: Diagnosis not present

## 2020-07-25 DIAGNOSIS — I10 Essential (primary) hypertension: Secondary | ICD-10-CM | POA: Diagnosis not present

## 2020-07-25 DIAGNOSIS — M12811 Other specific arthropathies, not elsewhere classified, right shoulder: Secondary | ICD-10-CM

## 2020-08-03 ENCOUNTER — Encounter: Payer: Self-pay | Admitting: Internal Medicine

## 2020-08-03 NOTE — Assessment & Plan Note (Signed)
Her albumin is slightly low.  Liver function tests are stable otherwise she does not drink anymore.

## 2020-08-03 NOTE — Progress Notes (Signed)
Established Patient Office Visit  Subjective:  Patient ID: Sonya Park, female    DOB: 11/13/1940  Age: 80 y.o. MRN: [id number]  CC:  Chief Complaint  Patient presents with  . PHQ-9 12 Week Follow-up  . Follow-up    1 week fu     HPI  Sonya Park presents forPatient is known to have essential hypertension history of COPD and liver problems secondary to smoking and heavy drinking in the past.  She has quit drinking and smoking completely.  Her liver failure is stable.  She is also known to have osteoporosis.  We will need to monitor carefully for malnutrition.  Shoulders are doing better on physical therapy.  She denies any history of vertigo.  Past Medical History:  Diagnosis Date  . Arthritis   . Back pain   . Insomnia   . Medical history non-contributory   . Osteoporosis     Past Surgical History:  Procedure Laterality Date  . ABDOMINAL HYSTERECTOMY    . BREAST LUMPECTOMY Right   . COLONOSCOPY WITH PROPOFOL N/A 07/18/2015   Procedure: COLONOSCOPY WITH PROPOFOL;  Surgeon: [name], MD;  Location: [facility];  Service: Endoscopy;  Laterality: N/A;  . COSMETIC SURGERY    . GASTRIC BYPASS     X2  . HEMORROIDECTOMY    . KNEE ARTHROPLASTY Right 12/13/2015   Procedure: COMPUTER ASSISTED TOTAL KNEE ARTHROPLASTY;  Surgeon: Dereck Leep, MD;  Location: [facility] OR[facility];  Service: Orthopedics;  Laterality: Right;  . KNEE ARTHROSCOPY    . REPLACEMENT TOTAL KNEE Left     Family History  Problem Relation Age of Onset  . Liver disease Mother   . Heart attack Father   . Cancer Father   . CAD Sister     Social History   Socioeconomic History  . Marital status: Married    Spouse name: Not on file  . Number of children: Not on file  . Years of education: Not on file  . Highest education level: Not on file  Occupational History  . Not on file  Tobacco Use  . Smoking status: Former Smoker    Quit date: 11/30/1994    Years since quitting: 25.6  . Smokeless tobacco:  Never Used  Substance and Sexual Activity  . Alcohol use: No    Alcohol/week: 21.0 - 35.0 standard drinks    Types: 21 - 35 Glasses of wine per week    Comment: 3-5 glasses of wine/ day  . Drug use: No  . Sexual activity: Not on file  Other Topics Concern  . Not on file  Social History Narrative  . Not on file   Social Determinants of Health   Financial Resource Strain: Not on file  Food Insecurity: Not on file  Transportation Needs: Not on file  Physical Activity: Not on file  Stress: Not on file  Social Connections: Not on file  Intimate Partner Violence: Not on file     Current Outpatient Medications:  .  acidophilus (RISAQUAD) CAPS capsule, Take 2 capsules 3 (three) times daily by mouth., Disp: , Rfl:  .  aspirin 81 MG tablet, Take 81 mg by mouth daily., Disp: , Rfl:  .  b complex vitamins capsule, Take by mouth., Disp: , Rfl:  .  calcium citrate-vitamin D (CITRACAL+D) 315-200 MG-UNIT tablet, Take by mouth., Disp: , Rfl:  .  Cholecalciferol 125 MCG (5000 UT) TABS, Take by mouth., Disp: , Rfl:  .  Copper Gluconate 2 MG CAPS,  Take 4 mg by mouth daily., Disp: 60 capsule, Rfl: 3 .  FEROSUL 325 (65 Fe) MG tablet, TAKE 1 TABLET DAILY, Disp: 90 tablet, Rfl: 3 .  Melatonin 5 MG TABS, Take by mouth., Disp: , Rfl:  .  multivitamin-lutein (OCUVITE-LUTEIN) CAPS capsule, Take 1 capsule daily by mouth., Disp: , Rfl: 0 .  spironolactone (ALDACTONE) 25 MG tablet, Take 1 tablet (25 mg total) by mouth daily., Disp: 90 tablet, Rfl: 3 .  thiamine 100 MG tablet, Take 1 tablet (100 mg total) daily by mouth., Disp: , Rfl:  .  vitamin B-12 500 MCG tablet, Take 1 tablet (500 mcg total) daily by mouth., Disp: , Rfl:  .  doxycycline (ADOXA) 100 MG tablet, Take 1 tablet (100 mg total) by mouth 2 (two) times daily., Disp: 14 tablet, Rfl: 0 .  fluticasone (FLONASE) 50 MCG/ACT nasal spray, INSTILL 2 SPRAYS INTO EACH NOSTRIL DAILY, Disp: , Rfl:  .  loratadine (CLARITIN) 10 MG tablet, Take 1 tablet (10 mg  total) by mouth daily., Disp: 30 tablet, Rfl: 11 .  meclizine (ANTIVERT) 12.5 MG tablet, Take 1 tablet (12.5 mg total) by mouth 3 (three) times daily as needed for dizziness., Disp: 30 tablet, Rfl: 0   Allergies  Allergen Reactions  . Chlorpheniramine   . Penicillins Swelling    Has patient had a PCN reaction causing immediate rash, facial/tongue/throat swelling, SOB or lightheadedness with hypotension: Yes Has patient had a PCN reaction causing severe rash involving mucus membranes or skin necrosis: No Has patient had a PCN reaction that required hospitalization: No Has patient had a PCN reaction occurring within the last 10 years: No If all of the above answers are "NO", then may proceed with Cephalosporin use. Other reaction(s): SWELLING  . Other Itching and Other (See Comments)    "mycin" doesn't know which one "mycin" doesn't know which one    ROS Review of Systems  Constitutional: Negative.   HENT: Negative.  Negative for dental problem.   Eyes: Negative.  Negative for itching.  Respiratory: Negative.  Negative for chest tightness.   Cardiovascular: Negative.  Negative for chest pain.  Gastrointestinal: Negative.  Negative for anal bleeding.  Endocrine: Negative.   Genitourinary: Negative.  Negative for dysuria.  Musculoskeletal: Positive for arthralgias.  Skin: Negative.   Allergic/Immunologic: Negative.   Neurological: Negative.  Negative for light-headedness and numbness.  Hematological: Negative.   Psychiatric/Behavioral: Negative.  Negative for agitation, confusion and dysphoric mood.  All other systems reviewed and are negative.     Objective:    Physical Exam Vitals reviewed.  Constitutional:      Appearance: Normal appearance.  HENT:     Mouth/Throat:     Mouth: Mucous membranes are moist.  Eyes:     Pupils: Pupils are equal, round, and reactive to light.  Neck:     Vascular: No carotid bruit.  Cardiovascular:     Rate and Rhythm: Normal rate and  regular rhythm.     Pulses: Normal pulses.     Heart sounds: Normal heart sounds.  Pulmonary:     Effort: Pulmonary effort is normal.     Breath sounds: Normal breath sounds.  Abdominal:     General: Bowel sounds are normal.     Palpations: Abdomen is soft. There is no hepatomegaly, splenomegaly or mass.     Tenderness: There is no abdominal tenderness.     Hernia: No hernia is present.  Musculoskeletal:        General: No tenderness.  Cervical back: Neck supple.     Right lower leg: No edema.     Left lower leg: No edema.  Skin:    Findings: No rash.  Neurological:     Mental Status: She is alert and oriented to person, place, and time.     Motor: No weakness.  Psychiatric:        Mood and Affect: Mood and affect normal.        Behavior: Behavior normal.     BP 118/77   Pulse 72   Ht 5\' 6"  (1.676 m)   Wt 142 lb 4.8 oz (64.5 kg)   BMI 22.97 kg/m  Wt Readings from Last 3 Encounters:  07/25/20 142 lb 4.8 oz (64.5 kg)  07/20/20 142 lb (64.4 kg)  06/27/20 143 lb 9.6 oz (65.1 kg)     Health Maintenance Due  Topic Date Due  . TETANUS/TDAP  Never done  . DEXA SCAN  Never done  . PNA vac Low Risk Adult (1 of 2 - PCV13) Never done  . COLONOSCOPY (Pts 45-25yrs Insurance coverage will need to be confirmed)  07/17/2020    There are no preventive care reminders to display for this patient.  Lab Results  Component Value Date   TSH 1.05 02/15/2020   Lab Results  Component Value Date   WBC 6.0 02/15/2020   HGB 12.2 02/15/2020   HCT 39.0 02/15/2020   MCV 94.7 02/15/2020   PLT 280 02/15/2020   Lab Results  Component Value Date   NA 141 07/20/2020   K 4.1 07/20/2020   CO2 25 07/20/2020   GLUCOSE 76 02/15/2020   BUN 16 02/15/2020   CREATININE 0.68 02/15/2020   BILITOT 1.0 02/15/2020   ALKPHOS 53 04/10/2017   AST 22 02/15/2020   ALT 15 02/15/2020   PROT 6.0 (L) 02/15/2020   ALBUMIN 3.3 (L) 04/10/2017   CALCIUM 8.3 (L) 02/15/2020   ANIONGAP 8 04/10/2017    Lab Results  Component Value Date   CHOL 195 02/15/2020   Lab Results  Component Value Date   HDL 79 02/15/2020   Lab Results  Component Value Date   LDLCALC 97 02/15/2020   Lab Results  Component Value Date   TRIG 96 02/15/2020   Lab Results  Component Value Date   CHOLHDL 2.5 02/15/2020   No results found for: HGBA1C    Assessment & Plan:   Problem List Items Addressed This Visit      Cardiovascular and Mediastinum   Essential hypertension - Primary    Patient blood pressure is normal patient denies any chest pain or shortness of breath there is no history of palpitation or paroxysmal nocturnal dyspnea   patient was advised to follow low-salt low-cholesterol diet    ideally I want to keep systolic blood pressure below 130 mmHg, patient was asked to check blood pressure one times a week and give me a report on that.  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  Liver function tests are stable otherwise she does not drink anymore.      Dysphagia  Dysphagia stable at the present time.        Musculoskeletal and Integument   Severe malnutrition (HCC)    It is stabilizing now      Rotator cuff arthropathy of right shoulder    Patient was advised to continue doing physical therapy         No orders of the defined types were placed in this encounter.  Patient was advised to return back in 3 months Follow-up: No follow-ups on file.    Cletis Athens, MD

## 2020-08-03 NOTE — Assessment & Plan Note (Signed)
Patient was advised to continue doing physical therapy

## 2020-08-03 NOTE — Assessment & Plan Note (Signed)
Patient was advised to take Claritin 10 mg daily 

## 2020-08-03 NOTE — Assessment & Plan Note (Signed)
Dysphagia stable at the present time.

## 2020-08-03 NOTE — Assessment & Plan Note (Signed)
Patient blood pressure is normal patient denies any chest pain or shortness of breath there is no history of palpitation or paroxysmal nocturnal dyspnea   patient was advised to follow low-salt low-cholesterol diet    ideally I want to keep systolic blood pressure below 130 mmHg, patient was asked to check blood pressure one times a week and give me a report on that.  Patient will be follow-up in 3 months  or earlier as needed, patient will call me back for any change in the cardiovascular symptoms    

## 2020-08-03 NOTE — Assessment & Plan Note (Signed)
It is stabilizing now

## 2020-08-21 ENCOUNTER — Other Ambulatory Visit: Payer: Self-pay | Admitting: *Deleted

## 2020-08-21 MED ORDER — IBANDRONATE SODIUM 150 MG PO TABS: 150.0000 mg | ORAL_TABLET | ORAL | 3 refills | Status: DC

## 2020-10-04 ENCOUNTER — Ambulatory Visit (INDEPENDENT_AMBULATORY_CARE_PROVIDER_SITE_OTHER): Payer: Medicare Other | Admitting: Internal Medicine

## 2020-10-04 ENCOUNTER — Other Ambulatory Visit: Payer: Self-pay

## 2020-10-04 ENCOUNTER — Encounter: Payer: Self-pay | Admitting: Internal Medicine

## 2020-10-04 VITALS — BP 148/86 | HR 67 | Ht 66.0 in | Wt 145.1 lb

## 2020-10-04 DIAGNOSIS — F1010 Alcohol abuse, uncomplicated: Secondary | ICD-10-CM

## 2020-10-04 DIAGNOSIS — I10 Essential (primary) hypertension: Secondary | ICD-10-CM | POA: Diagnosis not present

## 2020-10-04 DIAGNOSIS — J301 Allergic rhinitis due to pollen: Secondary | ICD-10-CM

## 2020-10-04 DIAGNOSIS — K7040 Alcoholic hepatic failure without coma: Secondary | ICD-10-CM | POA: Diagnosis not present

## 2020-10-04 NOTE — Assessment & Plan Note (Signed)
Take Claritin 5 mg as needed for allergy

## 2020-10-04 NOTE — Assessment & Plan Note (Signed)
Patient does not drink anymore. 

## 2020-10-04 NOTE — Progress Notes (Signed)
Established Patient Office Visit  Subjective:  Patient ID: Sonya Park, female    DOB: [birth year]  Age: 80 y.o. MRN: [id number]  CC:  Chief Complaint  Patient presents with   Hypertension    Hypertension   [name] presents for check up Past Medical History:  Diagnosis Date   Arthritis    Back pain    Insomnia    Medical history non-contributory    Osteoporosis     Past Surgical History:  Procedure Laterality Date   ABDOMINAL HYSTERECTOMY     BREAST LUMPECTOMY Right    COLONOSCOPY WITH PROPOFOL N/A 07/18/2015   Procedure: COLONOSCOPY WITH PROPOFOL;  Surgeon: [name], MD;  Location: [facility];  Service: Endoscopy;  Laterality: N/A;   COSMETIC SURGERY     GASTRIC BYPASS     X2   HEMORROIDECTOMY     KNEE ARTHROPLASTY Right 12/13/2015   Procedure: COMPUTER ASSISTED TOTAL KNEE ARTHROPLASTY;  Surgeon: Dereck Leep, MD;  Location: [facility];  Service: Orthopedics;  Laterality: Right;   KNEE ARTHROSCOPY     REPLACEMENT TOTAL KNEE Left     Family History  Problem Relation Age of Onset   Liver disease Mother    Heart attack Father    Cancer Father    CAD Sister     Social History   Socioeconomic History   Marital status: Married    Spouse name: Not on file   Number of children: Not on file   Years of education: Not on file   Highest education level: Not on file  Occupational History   Not on file  Tobacco Use   Smoking status: Former    Pack years: 0.00    Types: Cigarettes    Quit date: 11/30/1994    Years since quitting: 25.8   Smokeless tobacco: Never  Substance and Sexual Activity   Alcohol use: No    Alcohol/week: 21.0 - 35.0 standard drinks    Types: 21 - 35 Glasses of wine per week    Comment: 3-5 glasses of wine/ day   Drug use: No   Sexual activity: Not on file  Other Topics Concern   Not on file  Social History Narrative   Not on file   Social Determinants of Health   Financial Resource Strain: Not on file  Food  Insecurity: Not on file  Transportation Needs: Not on file  Physical Activity: Not on file  Stress: Not on file  Social Connections: Not on file  Intimate Partner Violence: Not on file     Current Outpatient Medications:    acidophilus (RISAQUAD) CAPS capsule, Take 2 capsules 3 (three) times daily by mouth., Disp: , Rfl:    aspirin 81 MG tablet, Take 81 mg by mouth daily., Disp: , Rfl:    b complex vitamins capsule, Take by mouth., Disp: , Rfl:    calcium citrate-vitamin D (CITRACAL+D) 315-200 MG-UNIT tablet, Take by mouth., Disp: , Rfl:    Cholecalciferol 125 MCG (5000 UT) TABS, Take by mouth., Disp: , Rfl:    Copper Gluconate 2 MG CAPS, Take 4 mg by mouth daily., Disp: 60 capsule, Rfl: 3   FEROSUL 325 (65 Fe) MG tablet, TAKE 1 TABLET DAILY, Disp: 90 tablet, Rfl: 3   ibandronate (BONIVA) 150 MG tablet, Take 1 tablet (150 mg total) by mouth every 30 (thirty) days., Disp: 4 tablet, Rfl: 3   Melatonin 5 MG TABS, Take by mouth., Disp: , Rfl:    multivitamin-lutein (OCUVITE-LUTEIN)  CAPS capsule, Take 1 capsule daily by mouth., Disp: , Rfl: 0   spironolactone (ALDACTONE) 25 MG tablet, Take 1 tablet (25 mg total) by mouth daily., Disp: 90 tablet, Rfl: 3   thiamine 100 MG tablet, Take 1 tablet (100 mg total) daily by mouth., Disp: , Rfl:    vitamin B-12 500 MCG tablet, Take 1 tablet (500 mcg total) daily by mouth., Disp: , Rfl:    Allergies  Allergen Reactions   Chlorpheniramine    Penicillins Swelling    Has patient had a PCN reaction causing immediate rash, facial/tongue/throat swelling, SOB or lightheadedness with hypotension: Yes Has patient had a PCN reaction causing severe rash involving mucus membranes or skin necrosis: No Has patient had a PCN reaction that required hospitalization: No Has patient had a PCN reaction occurring within the last 10 years: No If all of the above answers are "NO", then may proceed with Cephalosporin use. Other reaction(s): SWELLING   Other Itching and  Other (See Comments)    "mycin" doesn't know which one "mycin" doesn't know which one    ROS Review of Systems  Constitutional: Negative.   HENT: Negative.    Eyes: Negative.   Respiratory: Negative.    Cardiovascular: Negative.   Gastrointestinal: Negative.   Endocrine: Negative.   Genitourinary: Negative.   Musculoskeletal: Negative.   Skin: Negative.   Allergic/Immunologic: Negative.   Neurological: Negative.   Hematological: Negative.   Psychiatric/Behavioral: Negative.    All other systems reviewed and are negative.    Objective:    Physical Exam Vitals reviewed.  Constitutional:      Appearance: Normal appearance.  HENT:     Mouth/Throat:     Mouth: Mucous membranes are moist.  Eyes:     Pupils: Pupils are equal, round, and reactive to light.  Neck:     Vascular: No carotid bruit.  Cardiovascular:     Rate and Rhythm: Normal rate and regular rhythm.     Pulses: Normal pulses.     Heart sounds: Normal heart sounds.  Pulmonary:     Effort: Pulmonary effort is normal.     Breath sounds: Normal breath sounds.  Abdominal:     General: Bowel sounds are normal.     Palpations: Abdomen is soft. There is no hepatomegaly, splenomegaly or mass.     Tenderness: There is no abdominal tenderness.     Hernia: No hernia is present.  Musculoskeletal:        General: No tenderness.     Cervical back: Neck supple.     Right lower leg: No edema.     Left lower leg: No edema.  Skin:    Findings: No rash.  Neurological:     Mental Status: She is alert and oriented to person, place, and time.     Motor: No weakness.  Psychiatric:        Mood and Affect: Mood and affect normal.        Behavior: Behavior normal.    BP (!) 148/86   Pulse 67   Ht 5\' 6"  (1.676 m)   Wt 145 lb 1.6 oz (65.8 kg)   BMI 23.42 kg/m  Wt Readings from Last 3 Encounters:  10/04/20 145 lb 1.6 oz (65.8 kg)  07/25/20 142 lb 4.8 oz (64.5 kg)  07/20/20 142 lb (64.4 kg)     Health Maintenance  Due  Topic Date Due   TETANUS/TDAP  Never done   DEXA SCAN  Never done   PNA  vac Low Risk Adult (1 of 2 - PCV13) Never done   Zoster Vaccines- Shingrix (2 of 2) 06/29/2018   COVID-19 Vaccine (4 - Booster for Pfizer series) 05/16/2020   COLONOSCOPY (Pts 45-73yrs Insurance coverage will need to be confirmed)  07/17/2020    There are no preventive care reminders to display for this patient.  Lab Results  Component Value Date   TSH 1.05 02/15/2020   Lab Results  Component Value Date   WBC 6.0 02/15/2020   HGB 12.2 02/15/2020   HCT 39.0 02/15/2020   MCV 94.7 02/15/2020   PLT 280 02/15/2020   Lab Results  Component Value Date   NA 141 07/20/2020   K 4.1 07/20/2020   CO2 25 07/20/2020   GLUCOSE 76 02/15/2020   BUN 16 02/15/2020   CREATININE 0.68 02/15/2020   BILITOT 1.0 02/15/2020   ALKPHOS 53 04/10/2017   AST 22 02/15/2020   ALT 15 02/15/2020   PROT 6.0 (L) 02/15/2020   ALBUMIN 3.3 (L) 04/10/2017   CALCIUM 8.3 (L) 02/15/2020   ANIONGAP 8 04/10/2017   Lab Results  Component Value Date   CHOL 195 02/15/2020   Lab Results  Component Value Date   HDL 79 02/15/2020   Lab Results  Component Value Date   LDLCALC 97 02/15/2020   Lab Results  Component Value Date   TRIG 96 02/15/2020   Lab Results  Component Value Date   CHOLHDL 2.5 02/15/2020   No results found for: HGBA1C    Assessment & Plan:   Problem List Items Addressed This Visit       Cardiovascular and Mediastinum   Essential hypertension - Primary     Patient denies any chest pain or shortness of breath there is no history of palpitation or paroxysmal nocturnal dyspnea   patient was advised to follow low-salt low-cholesterol diet    ideally I want to keep systolic blood pressure below 130 mmHg, patient was asked to check blood pressure one times a week and give me a report on that.  Patient will be follow-up in 3 months  or earlier as needed, patient will call me back for any change in the  cardiovascular symptoms Patient was advised to buy a book from local bookstore concerning blood pressure and read several chapters  every day.  This will be supplemented by some of the material we will give him from the office.  Patient should also utilize other resources like YouTube and Internet to learn more about the blood pressure and the diet.         Respiratory   Seasonal allergic rhinitis due to pollen    Take Claritin 5 mg as needed for allergy         Digestive   Alcoholic liver failure (HCC)    Heart is regular chest is clear there is no ascites there is no tenderness in the liver area.  She does not have any swelling of the legs.         Other   Alcohol abuse    Patient does not drink anymore        No orders of the defined types were placed in this encounter.   Follow-up: No follow-ups on file.    Cletis Athens, MD

## 2020-10-04 NOTE — Assessment & Plan Note (Signed)

## 2020-10-04 NOTE — Assessment & Plan Note (Signed)
Heart is regular chest is clear there is no ascites there is no tenderness in the liver area.  She does not have any swelling of the legs.

## 2021-01-03 LAB — HM MAMMOGRAPHY: HM Mammogram: NORMAL (ref 0–4)

## 2021-01-15 ENCOUNTER — Other Ambulatory Visit: Payer: Self-pay

## 2021-01-15 ENCOUNTER — Ambulatory Visit: Payer: Medicare Other | Admitting: Internal Medicine

## 2021-01-16 ENCOUNTER — Encounter: Payer: Self-pay | Admitting: Internal Medicine

## 2021-01-16 ENCOUNTER — Ambulatory Visit: Payer: Medicare Other | Admitting: Internal Medicine

## 2021-01-16 VITALS — BP 150/86 | HR 81 | Ht 66.0 in | Wt 150.2 lb

## 2021-01-16 DIAGNOSIS — J301 Allergic rhinitis due to pollen: Secondary | ICD-10-CM

## 2021-01-16 DIAGNOSIS — K7040 Alcoholic hepatic failure without coma: Secondary | ICD-10-CM | POA: Diagnosis not present

## 2021-01-16 DIAGNOSIS — D696 Thrombocytopenia, unspecified: Secondary | ICD-10-CM

## 2021-01-16 DIAGNOSIS — I10 Essential (primary) hypertension: Secondary | ICD-10-CM

## 2021-01-16 DIAGNOSIS — Z1211 Encounter for screening for malignant neoplasm of colon: Secondary | ICD-10-CM | POA: Diagnosis not present

## 2021-01-16 DIAGNOSIS — M5382 Other specified dorsopathies, cervical region: Secondary | ICD-10-CM | POA: Insufficient documentation

## 2021-01-16 DIAGNOSIS — K639 Disease of intestine, unspecified: Secondary | ICD-10-CM

## 2021-01-16 NOTE — Assessment & Plan Note (Signed)
Patient quit drinking 4 years ago

## 2021-01-16 NOTE — Assessment & Plan Note (Signed)

## 2021-01-16 NOTE — Assessment & Plan Note (Signed)
Use Voltaren gel Using neck brace if needed

## 2021-01-16 NOTE — Assessment & Plan Note (Signed)
Patient scheduled for colonoscopy she was advised to take Mylanta as needed

## 2021-01-16 NOTE — Assessment & Plan Note (Signed)
She will get a full panel drawn- Patient's back pain is under control with medication.  - Encouraged the patient to stretch or do yoga as able to help with back pain

## 2021-01-16 NOTE — Assessment & Plan Note (Signed)
Stable at the present time. 

## 2021-01-16 NOTE — Progress Notes (Signed)
Established Patient Office Visit  Subjective:  Patient ID: Sonya Park, female    DOB: 11-14-1940  Age: 80 y.o. MRN: [id number]  CC:  Chief Complaint  Patient presents with   general check up    Patient would like to discuss starting Boniva injections. Abdominal tenderness mostly after patient eats, patient states it has been going on for 1 to 2 years. Swollen lymph nodes.    HPI  [name] presents for general check up  Past Medical History:  Diagnosis Date   Arthritis    Back pain    Insomnia    Medical history non-contributory    Osteoporosis     Past Surgical History:  Procedure Laterality Date   ABDOMINAL HYSTERECTOMY     BREAST LUMPECTOMY Right    COLONOSCOPY WITH PROPOFOL N/A 07/18/2015   Procedure: COLONOSCOPY WITH PROPOFOL;  Surgeon: [name], MD;  Location: [facility];  Service: Endoscopy;  Laterality: N/A;   COSMETIC SURGERY     GASTRIC BYPASS     X2   HEMORROIDECTOMY     KNEE ARTHROPLASTY Right 12/13/2015   Procedure: COMPUTER ASSISTED TOTAL KNEE ARTHROPLASTY;  Surgeon: Dereck Leep, MD;  Location: [facility] OR[facility];  Service: Orthopedics;  Laterality: Right;   KNEE ARTHROSCOPY     REPLACEMENT TOTAL KNEE Left     Family History  Problem Relation Age of Onset   Liver disease Mother    Heart attack Father    Cancer Father    CAD Sister     Social History   Socioeconomic History   Marital status: Married    Spouse name: Not on file   Number of children: Not on file   Years of education: Not on file   Highest education level: Not on file  Occupational History   Not on file  Tobacco Use   Smoking status: Former    Types: Cigarettes    Quit date: 11/30/1994    Years since quitting: 26.1   Smokeless tobacco: Never  Substance and Sexual Activity   Alcohol use: No    Alcohol/week: 21.0 - 35.0 standard drinks    Types: 21 - 35 Glasses of wine per week    Comment: 3-5 glasses of wine/ day   Drug use: No   Sexual activity: Not on file   Other Topics Concern   Not on file  Social History Narrative   Not on file   Social Determinants of Health   Financial Resource Strain: Not on file  Food Insecurity: Not on file  Transportation Needs: Not on file  Physical Activity: Not on file  Stress: Not on file  Social Connections: Not on file  Intimate Partner Violence: Not on file     Current Outpatient Medications:    acidophilus (RISAQUAD) CAPS capsule, Take 2 capsules 3 (three) times daily by mouth., Disp: , Rfl:    aspirin 81 MG tablet, Take 81 mg by mouth daily., Disp: , Rfl:    b complex vitamins capsule, Take by mouth., Disp: , Rfl:    calcium citrate-vitamin D (CITRACAL+D) 315-200 MG-UNIT tablet, Take by mouth., Disp: , Rfl:    Cholecalciferol 125 MCG (5000 UT) TABS, Take by mouth., Disp: , Rfl:    Copper Gluconate 2 MG CAPS, Take 4 mg by mouth daily., Disp: 60 capsule, Rfl: 3   FEROSUL 325 (65 Fe) MG tablet, TAKE 1 TABLET DAILY, Disp: 90 tablet, Rfl: 3   ibandronate (BONIVA) 150 MG tablet, Take 1 tablet (150 mg  total) by mouth every 30 (thirty) days., Disp: 4 tablet, Rfl: 3   Melatonin 5 MG TABS, Take by mouth., Disp: , Rfl:    multivitamin-lutein (OCUVITE-LUTEIN) CAPS capsule, Take 1 capsule daily by mouth., Disp: , Rfl: 0   spironolactone (ALDACTONE) 25 MG tablet, Take 1 tablet (25 mg total) by mouth daily., Disp: 90 tablet, Rfl: 3   thiamine 100 MG tablet, Take 1 tablet (100 mg total) daily by mouth., Disp: , Rfl:    vitamin B-12 500 MCG tablet, Take 1 tablet (500 mcg total) daily by mouth., Disp: , Rfl:    Allergies  Allergen Reactions   Chlorpheniramine    Penicillins Swelling    Has patient had a PCN reaction causing immediate rash, facial/tongue/throat swelling, SOB or lightheadedness with hypotension: Yes Has patient had a PCN reaction causing severe rash involving mucus membranes or skin necrosis: No Has patient had a PCN reaction that required hospitalization: No Has patient had a PCN reaction  occurring within the last 10 years: No If all of the above answers are "NO", then may proceed with Cephalosporin use. Other reaction(s): SWELLING   Other Itching and Other (See Comments)    "mycin" doesn't know which one "mycin" doesn't know which one    ROS Review of Systems  Constitutional: Negative.   HENT: Negative.    Eyes: Negative.   Respiratory: Negative.    Cardiovascular: Negative.   Gastrointestinal: Negative.   Endocrine: Negative.   Genitourinary: Negative.   Musculoskeletal: Negative.   Skin: Negative.   Allergic/Immunologic: Negative.   Neurological: Negative.   Hematological: Negative.   Psychiatric/Behavioral: Negative.    All other systems reviewed and are negative.    Objective:    Physical Exam Vitals reviewed.  Constitutional:      Appearance: Normal appearance.  HENT:     Mouth/Throat:     Mouth: Mucous membranes are moist.  Eyes:     Pupils: Pupils are equal, round, and reactive to light.  Neck:     Vascular: No carotid bruit.  Cardiovascular:     Rate and Rhythm: Normal rate and regular rhythm.     Pulses: Normal pulses.     Heart sounds: Normal heart sounds.  Pulmonary:     Effort: Pulmonary effort is normal.     Breath sounds: Normal breath sounds.  Abdominal:     General: Bowel sounds are normal.     Palpations: Abdomen is soft. There is no hepatomegaly, splenomegaly or mass.     Tenderness: There is no abdominal tenderness. There is no left CVA tenderness or guarding.     Hernia: No hernia is present.  Musculoskeletal:        General: No tenderness.     Cervical back: Neck supple.     Right lower leg: No edema.     Left lower leg: No edema.  Skin:    Findings: No rash.  Neurological:     Mental Status: She is alert and oriented to person, place, and time.     Motor: No weakness.  Psychiatric:        Mood and Affect: Mood and affect normal.        Behavior: Behavior normal.    BP (!) 150/86   Pulse 81   Ht 5\' 6"  (1.676 m)    Wt 150 lb 3.2 oz (68.1 kg)   BMI 24.24 kg/m  Wt Readings from Last 3 Encounters:  01/16/21 150 lb 3.2 oz (68.1 kg)  10/04/20 145 lb  1.6 oz (65.8 kg)  07/25/20 142 lb 4.8 oz (64.5 kg)     Health Maintenance Due  Topic Date Due   DEXA SCAN  Never done   COLONOSCOPY (Pts 45-9yrs Insurance coverage will need to be confirmed)  07/17/2020   INFLUENZA VACCINE  11/06/2020    There are no preventive care reminders to display for this patient.  Lab Results  Component Value Date   TSH 1.05 02/15/2020   Lab Results  Component Value Date   WBC 6.0 02/15/2020   HGB 12.2 02/15/2020   HCT 39.0 02/15/2020   MCV 94.7 02/15/2020   PLT 280 02/15/2020   Lab Results  Component Value Date   NA 141 07/20/2020   K 4.1 07/20/2020   CO2 25 07/20/2020   GLUCOSE 76 02/15/2020   BUN 16 02/15/2020   CREATININE 0.68 02/15/2020   BILITOT 1.0 02/15/2020   ALKPHOS 53 04/10/2017   AST 22 02/15/2020   ALT 15 02/15/2020   PROT 6.0 (L) 02/15/2020   ALBUMIN 3.3 (L) 04/10/2017   CALCIUM 8.3 (L) 02/15/2020   ANIONGAP 8 04/10/2017   Lab Results  Component Value Date   CHOL 195 02/15/2020   Lab Results  Component Value Date   HDL 79 02/15/2020   Lab Results  Component Value Date   LDLCALC 97 02/15/2020   Lab Results  Component Value Date   TRIG 96 02/15/2020   Lab Results  Component Value Date   CHOLHDL 2.5 02/15/2020   No results found for: HGBA1C    Assessment & Plan:   Problem List Items Addressed This Visit       Cardiovascular and Mediastinum   Essential hypertension     Patient denies any chest pain or shortness of breath there is no history of palpitation or paroxysmal nocturnal dyspnea   patient was advised to follow low-salt low-cholesterol diet    ideally I want to keep systolic blood pressure below 130 mmHg, patient was asked to check blood pressure one times a week and give me a report on that.  Patient will be follow-up in 3 months  or earlier as needed, patient  will call me back for any change in the cardiovascular symptoms Patient was advised to buy a book from local bookstore concerning blood pressure and read several chapters  every day.  This will be supplemented by some of the material we will give him from the office.  Patient should also utilize other resources like YouTube and Internet to learn more about the blood pressure and the diet.        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  - Encouraged the patient to stretch or do yoga as able to help with back pain        Other   Screening for colon  cancer - Primary   Relevant Orders   Ambulatory referral to Gastroenterology    No orders of the defined types were placed in this encounter.   Follow-up: No follow-ups on file.    Cletis Athens, MD

## 2021-01-17 ENCOUNTER — Encounter: Payer: Self-pay | Admitting: *Deleted

## 2021-01-22 ENCOUNTER — Encounter: Payer: Self-pay | Admitting: Internal Medicine

## 2021-01-24 ENCOUNTER — Other Ambulatory Visit: Payer: Self-pay | Admitting: Internal Medicine

## 2021-01-29 ENCOUNTER — Other Ambulatory Visit: Payer: Self-pay | Admitting: *Deleted

## 2021-01-29 DIAGNOSIS — E162 Hypoglycemia, unspecified: Secondary | ICD-10-CM

## 2021-02-05 ENCOUNTER — Other Ambulatory Visit: Payer: Self-pay | Admitting: *Deleted

## 2021-02-05 DIAGNOSIS — E162 Hypoglycemia, unspecified: Secondary | ICD-10-CM

## 2021-02-08 ENCOUNTER — Ambulatory Visit: Payer: Medicare Other | Admitting: Gastroenterology

## 2021-02-08 ENCOUNTER — Other Ambulatory Visit: Payer: Self-pay

## 2021-02-08 ENCOUNTER — Encounter: Payer: Self-pay | Admitting: Gastroenterology

## 2021-02-08 VITALS — BP 123/72 | HR 88 | Temp 97.2°F | Ht 66.0 in | Wt 151.2 lb

## 2021-02-08 DIAGNOSIS — R1012 Left upper quadrant pain: Secondary | ICD-10-CM | POA: Diagnosis not present

## 2021-02-08 MED ORDER — PANTOPRAZOLE SODIUM 40 MG PO TBEC: 40.0000 mg | DELAYED_RELEASE_TABLET | Freq: Every day | ORAL | 0 refills | Status: DC

## 2021-02-08 NOTE — Progress Notes (Signed)
Gastroenterology Consultation  Referring Provider:     Cletis Athens, MD Primary Care Physician:  Cletis Athens, MD Primary Gastroenterologist:  Dr. Allen Norris     Reason for Consultation:     History of a colon polyp Left sided abdominal pain        HPI:   Sonya Park is a 80 y.o. y/o female referred for consultation & management of history of a colon polyp by Dr. Cletis Athens, MD. this patient comes in today with having a adenomatous polyp found in 2017.  The patient had seen me in 2019 for abnormal liver enzymes and her repeat liver enzymes showed that he had returned back to normal.  The patient was asked to come in today due to her age and to see if she is actually in need of a surveillance colonoscopy due to her history of polyps. The patient reports that she's had some left upper quadrant pain that she states is related to eating.  She denies any unexplained weight loss.  The patient has had a gastric bypass in the past. She denies any fevers chills black stools or bloody stools. The patient has a history of alcohol abuse in has completely stopped drinking.  The patient's colonoscopy had only one single polyp.  Past Medical History:  Diagnosis Date   Arthritis    Back pain    Insomnia    Medical history non-contributory    Osteoporosis     Past Surgical History:  Procedure Laterality Date   ABDOMINAL HYSTERECTOMY     BREAST LUMPECTOMY Right    COLONOSCOPY WITH PROPOFOL N/A 07/18/2015   Procedure: COLONOSCOPY WITH PROPOFOL;  Surgeon: [name], MD;  Location: [facility];  Service: Endoscopy;  Laterality: N/A;   COSMETIC SURGERY     GASTRIC BYPASS     X2   HEMORROIDECTOMY     KNEE ARTHROPLASTY Right 12/13/2015   Procedure: COMPUTER ASSISTED TOTAL KNEE ARTHROPLASTY;  Surgeon: Dereck Leep, MD;  Location: [facility];  Service: Orthopedics;  Laterality: Right;   KNEE ARTHROSCOPY     REPLACEMENT TOTAL KNEE Left     Prior to Admission medications   Medication Sig Start  Date End Date Taking? Authorizing Provider  acidophilus (RISAQUAD) CAPS capsule Take 2 capsules 3 (three) times daily by mouth. 02/11/17   Nicholes Mango, MD  aspirin 81 MG tablet Take 81 mg by mouth daily.    [provider]  b complex vitamins capsule Take by mouth.    [provider]  calcium citrate-vitamin D (CITRACAL+D) 315-200 MG-UNIT tablet Take by mouth.    [provider]  Cholecalciferol 125 MCG (5000 UT) TABS Take by mouth.    [provider]  Copper Gluconate 2 MG CAPS Take 4 mg by mouth daily. 02/26/17   Cammie Sickle, MD  FEROSUL 325 (65 Fe) MG tablet TAKE 1 TABLET DAILY 01/25/21   Cletis Athens, MD  ibandronate (BONIVA) 150 MG tablet Take 1 tablet (150 mg total) by mouth every 30 (thirty) days. 08/21/20   Cletis Athens, MD  Melatonin 5 MG TABS Take by mouth.    [provider]  multivitamin-lutein (OCUVITE-LUTEIN) CAPS capsule Take 1 capsule daily by mouth. 02/12/17   Nicholes Mango, MD  spironolactone (ALDACTONE) 25 MG tablet Take 1 tablet (25 mg total) by mouth daily. 12/28/19   Cletis Athens, MD  thiamine 100 MG tablet Take 1 tablet (100 mg total) daily by mouth. 02/12/17   Nicholes Mango, MD  vitamin B-12  500 MCG tablet Take 1 tablet (500 mcg total) daily by mouth. 02/12/17   Nicholes Mango, MD    Family History  Problem Relation Age of Onset   Liver disease Mother    Heart attack Father    Cancer Father    CAD Sister      Social History   Tobacco Use   Smoking status: Former    Types: Cigarettes    Quit date: 11/30/1994    Years since quitting: 26.2   Smokeless tobacco: Never  Substance Use Topics   Alcohol use: No    Alcohol/week: 21.0 - 35.0 standard drinks    Types: 21 - 35 Glasses of wine per week    Comment: 3-5 glasses of wine/ day   Drug use: No    Allergies as of 02/08/2021 - Review Complete 01/16/2021  Allergen Reaction Noted   Chlorpheniramine  12/16/2016   Penicillins Swelling 08/26/2014   Other Itching  and Other (See Comments) 11/30/2015    Review of Systems:    All systems reviewed and negative except where noted in HPI.   Physical Exam:  There were no vitals taken for this visit. No LMP recorded. Patient has had a hysterectomy. General:   Alert,  Well-developed, well-nourished, pleasant and cooperative in NAD Head:  Normocephalic and atraumatic. Eyes:  Sclera clear, no icterus.   Conjunctiva pink. Ears:  Normal auditory acuity. Neck:  Supple; no masses or thyromegaly. Lungs:  Respirations even and unlabored.  Clear throughout to auscultation.   No wheezes, crackles, or rhonchi. No acute distress. Heart:  Regular rate and rhythm; no murmurs, clicks, rubs, or gallops. Abdomen:  Normal bowel sounds.  No bruits.  Soft, non-tender and non-distended without masses, hepatosplenomegaly or hernias noted.  No guarding or rebound tenderness.  Negative Carnett sign.   Rectal:  Deferred.  Pulses:  Normal pulses noted. Extremities:  No clubbing or edema.  No cyanosis. Neurologic:  Alert and oriented x3;  grossly normal neurologically. Skin:  Intact without significant lesions or rashes.  No jaundice. Lymph Nodes:  No significant cervical adenopathy. Psych:  Alert and cooperative. Normal mood and affect.  Imaging Studies: No results found.  Assessment and Plan:   Sonya Park is a 80 y.o. y/o female who comes in today with a history of abdominal pain and alcohol abuse.  The patient has stopped drinking for some time and her liver enzymes have returned to normal.  The patient has a history of a colon polyp at her last colonoscopy with the recommendation that time being a repeat colonoscopy in 5 years.  The recommendations from the governing bodies extended the recommendations of a single polyp to 7-10 years if adenomatous. The patient has been explained this and she has also been offered an upper endoscopy due to her abdominal pain versus treating with a PPI and see how she does.  The patient  has elected to not seen with a colonoscopy and to hold off on any endoscopic procedures at this time [place] the patient will be started on Protonix and of her symptoms do not improve then we can reconsider doing a procedure on her.  The patient has been explained the plan and agrees with it.    [name], MD. Marval Regal    Note: This dictation was prepared with Dragon dictation along with smaller phrase technology. Any transcriptional errors that result from this process are unintentional.

## 2021-02-14 ENCOUNTER — Other Ambulatory Visit: Payer: Self-pay

## 2021-02-14 DIAGNOSIS — Z0000 Encounter for general adult medical examination without abnormal findings: Secondary | ICD-10-CM

## 2021-02-15 ENCOUNTER — Other Ambulatory Visit
Admission: RE | Admit: 2021-02-15 | Discharge: 2021-02-15 | Disposition: A | Discharge status: Home / Self Care | Payer: Medicare Other | Source: Ambulatory Visit | Attending: Internal Medicine | Admitting: Internal Medicine

## 2021-02-15 DIAGNOSIS — R7989 Other specified abnormal findings of blood chemistry: Secondary | ICD-10-CM | POA: Insufficient documentation

## 2021-02-15 DIAGNOSIS — D709 Neutropenia, unspecified: Secondary | ICD-10-CM | POA: Diagnosis not present

## 2021-02-15 DIAGNOSIS — I10 Essential (primary) hypertension: Secondary | ICD-10-CM | POA: Insufficient documentation

## 2021-02-15 DIAGNOSIS — E785 Hyperlipidemia, unspecified: Secondary | ICD-10-CM | POA: Diagnosis not present

## 2021-02-15 DIAGNOSIS — Z0000 Encounter for general adult medical examination without abnormal findings: Secondary | ICD-10-CM | POA: Insufficient documentation

## 2021-02-15 LAB — CBC WITH DIFFERENTIAL/PLATELET
Abs Immature Granulocytes: 0.03 10*3/uL (ref 0.00–0.07)
Basophils Absolute: 0 10*3/uL (ref 0.0–0.1)
Basophils Relative: 1 %
Eosinophils Absolute: 0.3 10*3/uL (ref 0.0–0.5)
Eosinophils Relative: 5 %
HCT: 37.8 % (ref 36.0–46.0)
Hemoglobin: 12.5 g/dL (ref 12.0–15.0)
Immature Granulocytes: 1 %
Lymphocytes Relative: 24 %
Lymphs Abs: 1.4 10*3/uL (ref 0.7–4.0)
MCH: 31.5 pg (ref 26.0–34.0)
MCHC: 33.1 g/dL (ref 30.0–36.0)
MCV: 95.2 fL (ref 80.0–100.0)
Monocytes Absolute: 0.7 10*3/uL (ref 0.1–1.0)
Monocytes Relative: 11 %
Neutro Abs: 3.5 10*3/uL (ref 1.7–7.7)
Neutrophils Relative %: 58 %
Platelets: 219 10*3/uL (ref 150–400)
RBC: 3.97 MIL/uL (ref 3.87–5.11)
RDW: 13.9 % (ref 11.5–15.5)
WBC: 6 10*3/uL (ref 4.0–10.5)
nRBC: 0 % (ref 0.0–0.2)

## 2021-02-15 LAB — LIPID PANEL
Cholesterol: 208 mg/dL — ABNORMAL HIGH (ref 0–200)
HDL: 78 mg/dL (ref >40)
LDL Cholesterol: 105 mg/dL — ABNORMAL HIGH (ref 0–99)
Total CHOL/HDL Ratio: 2.7 RATIO
Triglycerides: 124 mg/dL (ref <150)
VLDL: 25 mg/dL (ref 0–40)

## 2021-02-15 LAB — COMPREHENSIVE METABOLIC PANEL
ALT: 18 U/L (ref 0–44)
AST: 24 U/L (ref 15–41)
Albumin: 3.8 g/dL (ref 3.5–5.0)
Alkaline Phosphatase: 53 U/L (ref 38–126)
Anion gap: 5 (ref 5–15)
BUN: 15 mg/dL (ref 8–23)
CO2: 29 mmol/L (ref 22–32)
Calcium: 8.6 mg/dL — ABNORMAL LOW (ref 8.9–10.3)
Chloride: 105 mmol/L (ref 98–111)
Creatinine, Ser: 0.64 mg/dL (ref 0.44–1.00)
GFR, Estimated: >60 mL/min (ref >60)
Glucose, Bld: 92 mg/dL (ref 70–99)
Potassium: 3.6 mmol/L (ref 3.5–5.1)
Sodium: 139 mmol/L (ref 135–145)
Total Bilirubin: 1 mg/dL (ref 0.3–1.2)
Total Protein: 6.5 g/dL (ref 6.5–8.1)

## 2021-02-15 LAB — TSH: TSH: 2.465 u[IU]/mL (ref 0.350–4.500)

## 2021-02-16 ENCOUNTER — Ambulatory Visit (INDEPENDENT_AMBULATORY_CARE_PROVIDER_SITE_OTHER): Payer: Medicare Other

## 2021-02-16 DIAGNOSIS — Z0000 Encounter for general adult medical examination without abnormal findings: Secondary | ICD-10-CM

## 2021-02-16 DIAGNOSIS — Z13820 Encounter for screening for osteoporosis: Secondary | ICD-10-CM

## 2021-02-16 NOTE — Progress Notes (Signed)
Subjective:   Sonya Park is a 80 y.o. female who presents for Medicare Annual (Subsequent) preventive examination. I discussed the limitations of evaluation and management by telemedicine and the availability of in person appointments. The patient expressed understanding and agreed to proceed.   Visit performed by audio   Patient location: Home  Provider location: Home  Review of Systems    N/A Cardiac Risk Factors include: none     Objective:    There were no vitals filed for this visit. There is no height or weight on file to calculate BMI.  Advanced Directives 02/16/2021 02/26/2017 01/29/2017 01/14/2017 12/13/2015 12/13/2015 11/30/2015  Does Patient Have a Medical Advance Directive? Yes Yes No No Yes Yes Yes  Type of Paramedic of [place];Living will [place];Living will - - Living will Living will Living will  Does patient want to make changes to medical advance directive? No - Patient declined No - Patient declined - - No - Patient declined No - Patient declined -  Copy of [place] in Chart? Yes - validated most recent copy scanned in chart (See row information) No - copy requested - - No - copy requested No - copy requested -  Would patient like information on creating a medical advance directive? - - No - Patient declined - - - -    Current Medications (verified) Outpatient Encounter Medications as of 02/16/2021  Medication Sig   aspirin 81 MG tablet Take 81 mg by mouth daily. Once a week (every Sunday)   b complex vitamins capsule Take by mouth.   calcium citrate-vitamin D (CITRACAL+D) 315-200 MG-UNIT tablet Take by mouth.   Cholecalciferol 125 MCG (5000 UT) TABS Take by mouth.   Copper Gluconate 2 MG CAPS Take 4 mg by mouth daily.   FEROSUL 325 (65 Fe) MG tablet TAKE 1 TABLET DAILY   ibandronate (BONIVA) 150 MG tablet Take 1 tablet (150 mg total) by mouth every 30 (thirty) days.   Melatonin 5 MG TABS  Take 5 mg by mouth. As needed   multivitamin-lutein (OCUVITE-LUTEIN) CAPS capsule Take 1 capsule daily by mouth.   pantoprazole (PROTONIX) 40 MG tablet Take 1 tablet (40 mg total) by mouth daily.   spironolactone (ALDACTONE) 25 MG tablet Take 1 tablet (25 mg total) by mouth daily. (Patient taking differently: Take 25 mg by mouth daily. Every other day due to swelling of the ankles, pt reports she adjust the medication depending on her ankle swelling)   thiamine 100 MG tablet Take 1 tablet (100 mg total) daily by mouth.   vitamin B-12 500 MCG tablet Take 1 tablet (500 mcg total) daily by mouth.   [DISCONTINUED] acidophilus (RISAQUAD) CAPS capsule Take 2 capsules 3 (three) times daily by mouth. (Patient not taking: No sig reported)   No facility-administered encounter medications on file as of 02/16/2021.    Allergies (verified) Chlorpheniramine, Penicillins, and Other   History: Past Medical History:  Diagnosis Date   Arthritis    Back pain    Insomnia    Medical history non-contributory    Osteoporosis    Past Surgical History:  Procedure Laterality Date   ABDOMINAL HYSTERECTOMY     BREAST LUMPECTOMY Right    COLONOSCOPY WITH PROPOFOL N/A 07/18/2015   Procedure: COLONOSCOPY WITH PROPOFOL;  Surgeon: [name], MD;  Location: [facility] ENDOSCOPY;  Service: Endoscopy;  Laterality: N/A;   COSMETIC SURGERY     GASTRIC BYPASS     X2  HEMORROIDECTOMY     KNEE ARTHROPLASTY Right 12/13/2015   Procedure: COMPUTER ASSISTED TOTAL KNEE ARTHROPLASTY;  Surgeon: Dereck Leep, MD;  Location: [facility] ORS;  Service: Orthopedics;  Laterality: Right;   KNEE ARTHROSCOPY     REPLACEMENT TOTAL KNEE Left    Family History  Problem Relation Age of Onset   Liver disease Mother    Heart attack Father    Cancer Father    CAD Sister    Social History   Socioeconomic History   Marital status: Married    Spouse name: Taisley Mordan   Number of children: 1   Years of education: 12   Highest education  level: Some college, no degree  Occupational History   Occupation: Retired  Tobacco Use   Smoking status: Former    Packs/day: 2.00    Years: 29.00    Pack years: 58.00    Types: Cigarettes    Quit date: 11/30/1994    Years since quitting: 26.2   Smokeless tobacco: Never  Vaping Use   Vaping Use: Never used  Substance and Sexual Activity   Alcohol use: Not Currently    Alcohol/week: 21.0 - 35.0 standard drinks    Types: 21 - 35 Glasses of wine per week    Comment: 3-5 glasses of wine/ day   Drug use: No   Sexual activity: Not Currently  Other Topics Concern   Not on file  Social History Narrative   Not on file   Social Determinants of Health   Financial Resource Strain: Low Risk    Difficulty of Paying Living Expenses: Not hard at all  Food Insecurity: No Food Insecurity   Worried About Charity fundraiser in the Last Year: Never true   [place] in the Last Year: Never true  Transportation Needs: No Transportation Needs   Lack of Transportation (Medical): No   Lack of Transportation (Non-Medical): No  Physical Activity: Sufficiently Active   Days of Exercise per Week: 3 days   Minutes of Exercise per Session: 60 min  Stress: No Stress Concern Present   Feeling of Stress : Not at all  Social Connections: Socially Integrated   Frequency of Communication with Friends and Family: More than three times a week   Frequency of Social Gatherings with Friends and Family: Once a week   Attends Religious Services: More than 4 times per year   Active Member of [employer] or Organizations: Yes   Attends Music therapist: More than 4 times per year   Marital Status: Married    Tobacco Counseling Counseling given: Not Answered   Clinical Intake:  Pre-visit preparation completed: Yes  Pain : No/denies pain     Diabetes: No  How often do you need to have someone help you when you read instructions, pamphlets, or other written materials from your doctor or  pharmacy?: 1 - Never What is the last grade level you completed in school?: 12  Diabetic?No  Interpreter Needed?: No  Information entered by :: Anson [place] CMA   Activities of Daily Living In your present state of health, do you have any difficulty performing the following activities: 02/16/2021  Hearing? N  Vision? N  Difficulty concentrating or making decisions? N  Walking or climbing stairs? N  Comment Pt states she just moves slower  Dressing or bathing? N  Doing errands, shopping? N  Preparing Food and eating ? N  Using the Toilet? N  In the past six months, have  you accidently leaked urine? Y  Do you have problems with loss of bowel control? Y  Managing your Medications? N  Managing your Finances? N  Housekeeping or managing your Housekeeping? N  Some recent data might be hidden    Patient Care Team: Cletis Athens, MD as PCP - General (Internal Medicine)  Indicate any recent Medical Services you may have received from other than [facility] providers in the past year (date may be approximate).     Assessment:   This is a routine wellness examination for Elisabetta.  Hearing/Vision screen No results found.  Dietary issues and exercise activities discussed: Current Exercise Habits: Home exercise routine, Type of exercise: walking;Other - see comments (Pt works in the garden), Time (Minutes): > 60, Frequency (Times/Week): 7, Weekly Exercise (Minutes/Week): 0, Intensity: Mild, Exercise limited by: neurologic condition(s) (Neuropathy)   Goals Addressed   None    Depression Screen PHQ 2/9 Scores 02/16/2021 02/14/2020  PHQ - 2 Score 1 0    Fall Risk Fall Risk  02/16/2021 02/14/2020  Falls in the past year? 0 0  Number falls in past yr: 0 0  Injury with Fall? 0 0  Risk for fall due to : No Fall Risks -  Follow up Falls evaluation completed -    FALL RISK PREVENTION PERTAINING TO THE HOME:  Any stairs in or around the home? Yes  If so, are there any without  handrails? No  Home free of loose throw rugs in walkways, pet beds, electrical cords, etc? Yes  Adequate lighting in your home to reduce risk of falls? Yes   ASSISTIVE DEVICES UTILIZED TO PREVENT FALLS:  Life alert? No  Use of a cane, walker or w/c? No  Grab bars in the bathroom? Yes  Shower chair or bench in shower? Yes  Elevated toilet seat or a handicapped toilet? Yes   TIMED UP AND GO:  Was the test performed? No .  Length of time to ambulate 10 feet: 0 sec.     Cognitive Function:     6CIT Screen 02/16/2021  What Year? 0 points  What month? 0 points  What time? 0 points  Count back from 20 0 points  Months in reverse 0 points  Repeat phrase 0 points  Total Score 0    Immunizations Immunization History  Administered Date(s) Administered   Fluad Quad(high Dose 65+) 02/02/2020   Influenza, High Dose Seasonal PF 01/16/2017, 11/27/2018   Influenza-Unspecified 01/21/2011   PFIZER Comirnaty(Gray Top)Covid-19 Tri-Sucrose Vaccine 10/22/2020   PFIZER(Purple Top)SARS-COV-2 Vaccination 04/18/2019, 05/09/2019, 02/14/2020   Pneumococcal Conjugate-13 09/24/2016   Pneumococcal Polysaccharide-23 01/02/2018   Tdap 11/12/2020, 11/12/2020   Zoster Recombinat (Shingrix) 01/06/2018, 05/04/2018    TDAP status: Up to date  Flu Vaccine status: Up to date  Pneumococcal vaccine status: Up to date  Covid-19 vaccine status: Information provided on how to obtain vaccines.   Qualifies for Shingles Vaccine? Yes   Zostavax completed Yes   Shingrix Completed?: Yes  Screening Tests Health Maintenance  Topic Date Due   DEXA SCAN  Never done   COLONOSCOPY (Pts 45-67yrs Insurance coverage will need to be confirmed)  07/17/2020   INFLUENZA VACCINE  11/06/2020   COVID-19 Vaccine (5 - Booster for Pfizer series) 12/17/2020   TETANUS/TDAP  11/13/2030   Pneumonia Vaccine 20+ Years old  Completed   Hepatitis C Screening  Completed   Zoster Vaccines- Shingrix  Completed   HPV VACCINES   Aged Out    Health Maintenance  Health Maintenance  Due  Topic Date Due   DEXA SCAN  Never done   COLONOSCOPY (Pts 45-87yrs Insurance coverage will need to be confirmed)  07/17/2020   INFLUENZA VACCINE  11/06/2020   COVID-19 Vaccine (5 - Booster for Pfizer series) 12/17/2020    Colorectal cancer screening: No longer required.   Mammogram status: Completed Yes. Repeat every year  Bone Density status: Ordered 02/16/2021. Pt provided with contact info and advised to call to schedule appt.  Lung Cancer Screening: (Low Dose CT Chest recommended if Age 38-80 years, 30 pack-year currently smoking OR have quit w/in 15years.) does not qualify.   Lung Cancer Screening Referral: No  Additional Screening:  Hepatitis C Screening: does not qualify; Completed No  Vision Screening: Recommended annual ophthalmology exams for early detection of glaucoma and other disorders of the eye. Is the patient up to date with their annual eye exam?  Yes  Who is the provider or what is the name of the office in which the patient attends annual eye exams?  If pt is not established with a provider, would they like to be referred to a provider to establish care? No .   Dental Screening: Recommended annual dental exams for proper oral hygiene  Community Resource Referral / Chronic Care Management: CRR required this visit?  No   CCM required this visit?  No      Plan:     I have personally reviewed and noted the following in the patient's chart:   Medical and social history Use of alcohol, tobacco or illicit drugs  Current medications and supplements including opioid prescriptions.  Functional ability and status Nutritional status Physical activity Advanced directives List of other physicians Hospitalizations, surgeries, and ER visits in previous 12 months Vitals Screenings to include cognitive, depression, and falls Referrals and appointments  In addition, I have reviewed and discussed with  patient certain preventive protocols, quality metrics, and best practice recommendations. A written personalized care plan for preventive services as well as general preventive health recommendations were provided to patient.   I discussed the limitations of evaluation and management by telemedicine and the availability of in person appointments. The patient expressed understanding and agreed to proceed.   Visit performed by audio   Patient location: Home  Provider location: [place], [place]   02/16/2021

## 2021-02-19 ENCOUNTER — Encounter: Payer: Medicare Other | Admitting: Internal Medicine

## 2021-02-21 ENCOUNTER — Other Ambulatory Visit: Payer: Self-pay

## 2021-02-21 DIAGNOSIS — E162 Hypoglycemia, unspecified: Secondary | ICD-10-CM

## 2021-03-03 ENCOUNTER — Other Ambulatory Visit: Payer: Self-pay | Admitting: Gastroenterology

## 2021-03-07 ENCOUNTER — Telehealth: Payer: Self-pay | Admitting: Gastroenterology

## 2021-03-07 ENCOUNTER — Other Ambulatory Visit: Payer: Self-pay

## 2021-03-07 MED ORDER — PANTOPRAZOLE SODIUM 40 MG PO TBEC: 40.0000 mg | DELAYED_RELEASE_TABLET | Freq: Every day | ORAL | 6 refills | Status: DC

## 2021-03-07 NOTE — Telephone Encounter (Signed)
Pt notified Pantoprazole has been sent to [facility] per request.

## 2021-03-07 NOTE — Telephone Encounter (Signed)
Inbound call from pt requesting a refill for Pantoprazole. Please advise. Thank you.

## 2021-03-08 NOTE — Progress Notes (Signed)
I have reviewed this visit and agree with the documentation.   

## 2021-03-19 ENCOUNTER — Ambulatory Visit: Payer: Medicare Other | Admitting: Internal Medicine

## 2021-04-04 ENCOUNTER — Telehealth: Payer: Self-pay | Admitting: Gastroenterology

## 2021-04-04 ENCOUNTER — Other Ambulatory Visit: Payer: Self-pay

## 2021-04-04 MED ORDER — PANTOPRAZOLE SODIUM 40 MG PO TBEC: 40.0000 mg | DELAYED_RELEASE_TABLET | Freq: Every day | ORAL | 3 refills | Status: DC

## 2021-04-04 NOTE — Telephone Encounter (Signed)
Spoke with pt regarding the pain under her ribs. She stated she felt the pantoprazole is working. Pt requested a rx to be sent to [facility].

## 2021-04-04 NOTE — Telephone Encounter (Signed)
Inbound call from pt requesting a call back stating that she is having pain under her left rib cage. Also if her pantoprazole can be sent in through [facility] so it can be mailed to her. Thank you.

## 2021-04-11 ENCOUNTER — Ambulatory Visit: Payer: Medicare Other | Admitting: Internal Medicine

## 2021-04-11 ENCOUNTER — Encounter: Payer: Self-pay | Admitting: Internal Medicine

## 2021-04-11 ENCOUNTER — Other Ambulatory Visit: Payer: Self-pay

## 2021-04-11 VITALS — Ht 66.0 in | Wt 153.5 lb

## 2021-04-11 DIAGNOSIS — J301 Allergic rhinitis due to pollen: Secondary | ICD-10-CM | POA: Diagnosis not present

## 2021-04-11 DIAGNOSIS — I10 Essential (primary) hypertension: Secondary | ICD-10-CM

## 2021-04-11 DIAGNOSIS — E162 Hypoglycemia, unspecified: Secondary | ICD-10-CM | POA: Diagnosis not present

## 2021-04-11 DIAGNOSIS — D520 Dietary folate deficiency anemia: Secondary | ICD-10-CM | POA: Diagnosis not present

## 2021-04-11 DIAGNOSIS — Q782 Osteopetrosis: Secondary | ICD-10-CM

## 2021-04-11 DIAGNOSIS — K7040 Alcoholic hepatic failure without coma: Secondary | ICD-10-CM | POA: Diagnosis not present

## 2021-04-11 LAB — POCT GLYCOSYLATED HEMOGLOBIN (HGB A1C): Hemoglobin A1C: 4.8 % (ref 4.0–5.6)

## 2021-04-11 LAB — GLUCOSE, POCT (MANUAL RESULT ENTRY): POC Glucose: 77 mg/dl (ref 70–99)

## 2021-04-11 NOTE — Assessment & Plan Note (Signed)
Patient was advised to use protein snacks 1 hour after eating to prevent hypoglycemia, she seems to be having symptoms of reactive hypoglycemia

## 2021-04-11 NOTE — Assessment & Plan Note (Signed)
Blood pressure is stable at the present time 

## 2021-04-11 NOTE — Addendum Note (Signed)
Addended by: Alois Cliche on: 04/11/2021 02:55 PM   Modules accepted: Orders

## 2021-04-11 NOTE — Assessment & Plan Note (Signed)
Patient has cirrhosis of the liver, will get scan of the liver by ultrasound

## 2021-04-11 NOTE — Assessment & Plan Note (Signed)
Stable

## 2021-04-11 NOTE — Progress Notes (Signed)
Established Patient Office Visit  Subjective:  Patient ID: Sonya Park, female    DOB: 1941-03-15  Age: 81 y.o. MRN: [id number]  CC:  Chief Complaint  Patient presents with   Follow-up    Patient has episodes of confusion, patient feels like her sugar drops at times. Patient was diagnosed with hypoglycemia back in the 90s.     HPI  Sonya Park presents for low blood sugar  Past Medical History:  Diagnosis Date   Arthritis    Back pain    Insomnia    Medical history non-contributory    Osteoporosis     Past Surgical History:  Procedure Laterality Date   ABDOMINAL HYSTERECTOMY     BREAST LUMPECTOMY Right    COLONOSCOPY WITH PROPOFOL N/A 07/18/2015   Procedure: COLONOSCOPY WITH PROPOFOL;  Surgeon: [name], MD;  Location: [facility] ENDOSCOPY;  Service: Endoscopy;  Laterality: N/A;   COSMETIC SURGERY     GASTRIC BYPASS     X2   HEMORROIDECTOMY     KNEE ARTHROPLASTY Right 12/13/2015   Procedure: COMPUTER ASSISTED TOTAL KNEE ARTHROPLASTY;  Surgeon: Dereck Leep, MD;  Location: [facility] ORS;  Service: Orthopedics;  Laterality: Right;   KNEE ARTHROSCOPY     REPLACEMENT TOTAL KNEE Left     Family History  Problem Relation Age of Onset   Liver disease Mother    Heart attack Father    Cancer Father    CAD Sister     Social History   Socioeconomic History   Marital status: Married    Spouse name: Dewanna Hurston   Number of children: 1   Years of education: 12   Highest education level: Some college, no degree  Occupational History   Occupation: Retired  Tobacco Use   Smoking status: Former    Packs/day: 2.00    Years: 29.00    Pack years: 58.00    Types: Cigarettes    Quit date: 11/30/1994    Years since quitting: 26.3   Smokeless tobacco: Never  Vaping Use   Vaping Use: Never used  Substance and Sexual Activity   Alcohol use: Not Currently    Alcohol/week: 21.0 - 35.0 standard drinks    Types: 21 - 35 Glasses of wine per week    Comment: 3-5 glasses of  wine/ day   Drug use: No   Sexual activity: Not Currently  Other Topics Concern   Not on file  Social History Narrative   Not on file   Social Determinants of Health   Financial Resource Strain: Low Risk    Difficulty of Paying Living Expenses: Not hard at all  Food Insecurity: No Food Insecurity   Worried About Charity fundraiser in the Last Year: Never true   [place] in the Last Year: Never true  Transportation Needs: No Transportation Needs   Lack of Transportation (Medical): No   Lack of Transportation (Non-Medical): No  Physical Activity: Sufficiently Active   Days of Exercise per Week: 3 days   Minutes of Exercise per Session: 60 min  Stress: No Stress Concern Present   Feeling of Stress : Not at all  Social Connections: Socially Integrated   Frequency of Communication with Friends and Family: More than three times a week   Frequency of Social Gatherings with Friends and Family: Once a week   Attends Religious Services: More than 4 times per year   Active Member of [employer] or Organizations: Yes   Attends [employer]  or Organization Meetings: More than 4 times per year   Marital Status: Married  Human resources officer Violence: Not At Risk   Fear of Current or Ex-Partner: No   Emotionally Abused: No   Physically Abused: No   Sexually Abused: No     Current Outpatient Medications:    aspirin 81 MG tablet, Take 81 mg by mouth daily. Once a week (every Sunday), Disp: , Rfl:    b complex vitamins capsule, Take by mouth., Disp: , Rfl:    calcium citrate-vitamin D (CITRACAL+D) 315-200 MG-UNIT tablet, Take by mouth., Disp: , Rfl:    Cholecalciferol 125 MCG (5000 UT) TABS, Take by mouth., Disp: , Rfl:    Copper Gluconate 2 MG CAPS, Take 4 mg by mouth daily., Disp: 60 capsule, Rfl: 3   FEROSUL 325 (65 Fe) MG tablet, TAKE 1 TABLET DAILY, Disp: 90 tablet, Rfl: 3   ibandronate (BONIVA) 150 MG tablet, Take 1 tablet (150 mg total) by mouth every 30 (thirty) days., Disp: 4 tablet, Rfl:  3   Melatonin 5 MG TABS, Take 5 mg by mouth. As needed, Disp: , Rfl:    multivitamin-lutein (OCUVITE-LUTEIN) CAPS capsule, Take 1 capsule daily by mouth., Disp: , Rfl: 0   pantoprazole (PROTONIX) 40 MG tablet, Take 1 tablet (40 mg total) by mouth daily., Disp: 90 tablet, Rfl: 3   spironolactone (ALDACTONE) 25 MG tablet, Take 1 tablet (25 mg total) by mouth daily. (Patient taking differently: Take 25 mg by mouth daily. Every other day due to swelling of the ankles, pt reports she adjust the medication depending on her ankle swelling), Disp: 90 tablet, Rfl: 3   thiamine 100 MG tablet, Take 1 tablet (100 mg total) daily by mouth., Disp: , Rfl:    vitamin B-12 500 MCG tablet, Take 1 tablet (500 mcg total) daily by mouth., Disp: , Rfl:    Allergies  Allergen Reactions   Chlorpheniramine    Penicillins Swelling    Has patient had a PCN reaction causing immediate rash, facial/tongue/throat swelling, SOB or lightheadedness with hypotension: Yes Has patient had a PCN reaction causing severe rash involving mucus membranes or skin necrosis: No Has patient had a PCN reaction that required hospitalization: No Has patient had a PCN reaction occurring within the last 10 years: No If all of the above answers are "NO", then may proceed with Cephalosporin use. Other reaction(s): SWELLING   Other Itching and Other (See Comments)    "mycin" doesn't know which one "mycin" doesn't know which one    ROS Review of Systems  Constitutional: Negative.   HENT: Negative.    Eyes: Negative.   Respiratory: Negative.    Cardiovascular: Negative.   Gastrointestinal: Negative.   Endocrine: Negative.   Genitourinary: Negative.   Musculoskeletal: Negative.   Skin: Negative.   Allergic/Immunologic: Negative.   Neurological: Negative.   Hematological: Negative.   Psychiatric/Behavioral: Negative.    All other systems reviewed and are negative.    Objective:    Physical Exam Vitals reviewed.  Constitutional:       Appearance: Normal appearance.  HENT:     Mouth/Throat:     Mouth: Mucous membranes are moist.  Eyes:     Pupils: Pupils are equal, round, and reactive to light.  Neck:     Vascular: No carotid bruit.  Cardiovascular:     Rate and Rhythm: Normal rate and regular rhythm.     Pulses: Normal pulses.     Heart sounds: Normal heart sounds.  Pulmonary:  Effort: Pulmonary effort is normal.     Breath sounds: Normal breath sounds.  Abdominal:     General: Bowel sounds are normal.     Palpations: Abdomen is soft. There is no hepatomegaly, splenomegaly or mass.     Tenderness: There is no abdominal tenderness.     Hernia: No hernia is present.  Musculoskeletal:        General: No tenderness.     Cervical back: Neck supple.     Right lower leg: No edema.     Left lower leg: No edema.  Skin:    Findings: No rash.  Neurological:     Mental Status: She is alert and oriented to person, place, and time.     Motor: No weakness.  Psychiatric:        Mood and Affect: Mood and affect normal.        Behavior: Behavior normal.    Ht 5\' 6"  (1.676 m)    Wt 153 lb 8 oz (69.6 kg)    BMI 24.78 kg/m  Wt Readings from Last 3 Encounters:  04/11/21 153 lb 8 oz (69.6 kg)  02/08/21 151 lb 3.2 oz (68.6 kg)  01/16/21 150 lb 3.2 oz (68.1 kg)     Health Maintenance Due  Topic Date Due   DEXA SCAN  Never done   COVID-19 Vaccine (5 - Booster for Pfizer series) 12/17/2020    There are no preventive care reminders to display for this patient.  Lab Results  Component Value Date   TSH 2.465 02/15/2021   Lab Results  Component Value Date   WBC 6.0 02/15/2021   HGB 12.5 02/15/2021   HCT 37.8 02/15/2021   MCV 95.2 02/15/2021   PLT 219 02/15/2021   Lab Results  Component Value Date   NA 139 02/15/2021   K 3.6 02/15/2021   CO2 29 02/15/2021   GLUCOSE 92 02/15/2021   BUN 15 02/15/2021   CREATININE 0.64 02/15/2021   BILITOT 1.0 02/15/2021   ALKPHOS 53 02/15/2021   AST 24 02/15/2021    ALT 18 02/15/2021   PROT 6.5 02/15/2021   ALBUMIN 3.8 02/15/2021   CALCIUM 8.6 (L) 02/15/2021   ANIONGAP 5 02/15/2021   Lab Results  Component Value Date   CHOL 208 (H) 02/15/2021   Lab Results  Component Value Date   HDL 78 02/15/2021   Lab Results  Component Value Date   LDLCALC 105 (H) 02/15/2021   Lab Results  Component Value Date   TRIG 124 02/15/2021   Lab Results  Component Value Date   CHOLHDL 2.7 02/15/2021   Lab Results  Component Value Date   HGBA1C 4.8 04/11/2021      Assessment & Plan:   Problem List Items Addressed This Visit       Cardiovascular and Mediastinum   Essential hypertension    Blood pressure is stable at the present time        Respiratory   Seasonal allergic rhinitis due to pollen    Stable        Digestive   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   Follow-up: No follow-ups on file.    Cletis Athens, MD

## 2021-04-17 ENCOUNTER — Ambulatory Visit: Payer: Medicare Other | Admitting: Internal Medicine

## 2021-04-20 ENCOUNTER — Ambulatory Visit
Admission: RE | Admit: 2021-04-20 | Discharge: 2021-04-20 | Disposition: A | Discharge status: Home / Self Care | Payer: Medicare Other | Source: Ambulatory Visit | Attending: Internal Medicine | Admitting: Internal Medicine

## 2021-04-20 DIAGNOSIS — K7040 Alcoholic hepatic failure without coma: Secondary | ICD-10-CM | POA: Diagnosis present

## 2021-06-04 ENCOUNTER — Other Ambulatory Visit: Payer: Medicare Other

## 2021-06-13 ENCOUNTER — Ambulatory Visit
Admission: RE | Admit: 2021-06-13 | Discharge: 2021-06-13 | Disposition: A | Discharge status: Home / Self Care | Payer: Medicare Other | Source: Ambulatory Visit | Attending: Internal Medicine | Admitting: Internal Medicine

## 2021-06-13 ENCOUNTER — Other Ambulatory Visit: Payer: Self-pay

## 2021-06-13 DIAGNOSIS — Z780 Asymptomatic menopausal state: Secondary | ICD-10-CM | POA: Diagnosis not present

## 2021-06-13 DIAGNOSIS — Q782 Osteopetrosis: Secondary | ICD-10-CM | POA: Insufficient documentation

## 2021-06-13 DIAGNOSIS — M810 Age-related osteoporosis without current pathological fracture: Secondary | ICD-10-CM | POA: Diagnosis not present

## 2021-06-13 DIAGNOSIS — Z13820 Encounter for screening for osteoporosis: Secondary | ICD-10-CM | POA: Diagnosis not present

## 2021-06-22 ENCOUNTER — Telehealth: Payer: Self-pay | Admitting: Gastroenterology

## 2021-06-22 NOTE — Telephone Encounter (Signed)
Patient says she wants to leave a message for Dr Allen Norris. Patient states she took the prescription (pantoprazole) he prescribed. She states it worked fine for the first couple months but it isn't anymore. Patient states she went to the gym and was hurting really bad under her left rib. Patient says she is concerned about it. Patient wants to know what Dr Allen Norris wants to do next. ?

## 2021-06-26 NOTE — Telephone Encounter (Signed)
Left message on voicemail.

## 2021-06-27 NOTE — Telephone Encounter (Signed)
Sonya Lame, MD  You 6 minutes ago (3:40 PM)  ? ?Not sure if it her reflux or muscle pain from the gym. If it continues then she may want to come in. Or try a week of the pantoprazole twice a day to see if that helps.   ? ?

## 2021-06-27 NOTE — Telephone Encounter (Signed)
I spoke to pt and she stated she will try Pantoprazole BID and let us know if she does not have improvement ?

## 2021-07-26 ENCOUNTER — Other Ambulatory Visit: Payer: Self-pay

## 2021-07-26 MED ORDER — SPIRONOLACTONE 25 MG PO TABS: 25.0000 mg | ORAL_TABLET | Freq: Every day | ORAL | 3 refills | Status: DC

## 2021-07-27 ENCOUNTER — Encounter: Payer: Self-pay | Admitting: Nurse Practitioner

## 2021-07-27 ENCOUNTER — Ambulatory Visit: Payer: Medicare Other | Admitting: Nurse Practitioner

## 2021-07-27 VITALS — BP 141/93 | HR 80 | Ht 66.0 in | Wt 155.0 lb

## 2021-07-27 DIAGNOSIS — N760 Acute vaginitis: Secondary | ICD-10-CM | POA: Diagnosis not present

## 2021-07-27 LAB — POCT URINALYSIS DIPSTICK
Appearance: NORMAL
Bilirubin, UA: NEGATIVE
Blood, UA: NEGATIVE
Glucose, UA: NEGATIVE
Ketones, UA: NEGATIVE
Leukocytes, UA: NEGATIVE
Nitrite, UA: NEGATIVE
Protein, UA: NEGATIVE
Spec Grav, UA: 1.015 (ref 1.010–1.025)
Urobilinogen, UA: NEGATIVE E.U./dL — AB
pH, UA: 6 (ref 5.0–8.0)

## 2021-07-27 MED ORDER — NYSTATIN-TRIAMCINOLONE 100000-0.1 UNIT/GM-% EX OINT: 1.0000 "application " | TOPICAL_OINTMENT | Freq: Two times a day (BID) | CUTANEOUS | 0 refills | Status: DC

## 2021-07-27 MED ORDER — FLUCONAZOLE 150 MG PO TABS: 150.0000 mg | ORAL_TABLET | Freq: Once | ORAL | 0 refills | Status: AC

## 2021-07-27 NOTE — Assessment & Plan Note (Signed)
Urinalysis negative for UTI. ?Vaginal pruritis without discharge.  ?Diflucan 150 mg one dose. ?Nystatin triamcinolone ointment twice daily.   ?

## 2021-07-27 NOTE — Progress Notes (Signed)
? ?Established Patient Office Visit ? ?Subjective:  ?Patient ID: Sonya Park, female    DOB: 11/26/1940  Age: 81 y.o. MRN: [id number] ? ?CC:  ?Chief Complaint  ?Patient presents with  ? Vaginal Itching  ?  Patient has been having vaginal itching x 2 weeks or more and vagisil has not been helping   ? ? ? ?HPI ? ?Sonya Park presents for vaginal itching from couple of weeks. She denies dysuria, urgency or frequency. ? ?HPI  ? ?Past Medical History:  ?Diagnosis Date  ? Arthritis   ? Back pain   ? Insomnia   ? Medical history non-contributory   ? Osteoporosis   ? ? ?Past Surgical History:  ?Procedure Laterality Date  ? ABDOMINAL HYSTERECTOMY    ? BREAST LUMPECTOMY Right   ? COLONOSCOPY WITH PROPOFOL N/A 07/18/2015  ? Procedure: COLONOSCOPY WITH PROPOFOL;  Surgeon: [name], MD;  Location: [facility];  Service: Endoscopy;  Laterality: N/A;  ? COSMETIC SURGERY    ? GASTRIC BYPASS    ? X2  ? HEMORROIDECTOMY    ? KNEE ARTHROPLASTY Right 12/13/2015  ? Procedure: COMPUTER ASSISTED TOTAL KNEE ARTHROPLASTY;  Surgeon: Dereck Leep, MD;  Location: [facility];  Service: Orthopedics;  Laterality: Right;  ? KNEE ARTHROSCOPY    ? REPLACEMENT TOTAL KNEE Left   ? ? ?Family History  ?Problem Relation Age of Onset  ? Liver disease Mother   ? Heart attack Father   ? Cancer Father   ? CAD Sister   ? ? ?Social History  ? ?Socioeconomic History  ? Marital status: Married  ?  Spouse name: Ross Hefferan  ? Number of children: 1  ? Years of education: 68  ? Highest education level: Some college, no degree  ?Occupational History  ? Occupation: Retired  ?Tobacco Use  ? Smoking status: Former  ?  Packs/day: 2.00  ?  Years: 29.00  ?  Pack years: 58.00  ?  Types: Cigarettes  ?  Quit date: 11/30/1994  ?  Years since quitting: 26.6  ? Smokeless tobacco: Never  ?Vaping Use  ? Vaping Use: Never used  ?Substance and Sexual Activity  ? Alcohol use: Not Currently  ?  Alcohol/week: 21.0 - 35.0 standard drinks  ?  Types: 21 - 35 Glasses of wine per  week  ?  Comment: 3-5 glasses of wine/ day  ? Drug use: No  ? Sexual activity: Not Currently  ?Other Topics Concern  ? Not on file  ?Social History Narrative  ? Not on file  ? ?Social Determinants of Health  ? ?Financial Resource Strain: Low Risk   ? Difficulty of Paying Living Expenses: Not hard at all  ?Food Insecurity: No Food Insecurity  ? Worried About Charity fundraiser in the Last Year: Never true  ? Ran Out of Food in the Last Year: Never true  ?Transportation Needs: No Transportation Needs  ? Lack of Transportation (Medical): No  ? Lack of Transportation (Non-Medical): No  ?Physical Activity: Sufficiently Active  ? Days of Exercise per Week: 3 days  ? Minutes of Exercise per Session: 60 min  ?Stress: No Stress Concern Present  ? Feeling of Stress : Not at all  ?Social Connections: Socially Integrated  ? Frequency of Communication with Friends and Family: More than three times a week  ? Frequency of Social Gatherings with Friends and Family: Once a week  ? Attends Religious Services: More than 4 times per year  ? Active Member  of Clubs or Organizations: Yes  ? Attends Archivist Meetings: More than 4 times per year  ? Marital Status: Married  ?Intimate Partner Violence: Not At Risk  ? Fear of Current or Ex-Partner: No  ? Emotionally Abused: No  ? Physically Abused: No  ? Sexually Abused: No  ? ? ? ?Outpatient Medications Prior to Visit  ?Medication Sig Dispense Refill  ? aspirin 81 MG tablet Take 81 mg by mouth daily. Once a week (every Sunday)    ? b complex vitamins capsule Take by mouth.    ? calcium citrate-vitamin D (CITRACAL+D) 315-200 MG-UNIT tablet Take by mouth.    ? Cholecalciferol 125 MCG (5000 UT) TABS Take by mouth.    ? Copper Gluconate 2 MG CAPS Take 4 mg by mouth daily. 60 capsule 3  ? FEROSUL 325 (65 Fe) MG tablet TAKE 1 TABLET DAILY 90 tablet 3  ? ibandronate (BONIVA) 150 MG tablet Take 1 tablet (150 mg total) by mouth every 30 (thirty) days. 4 tablet 3  ? Melatonin 5 MG TABS  Take 5 mg by mouth. As needed    ? multivitamin-lutein (OCUVITE-LUTEIN) CAPS capsule Take 1 capsule daily by mouth.  0  ? pantoprazole (PROTONIX) 40 MG tablet Take 1 tablet (40 mg total) by mouth daily. 90 tablet 3  ? spironolactone (ALDACTONE) 25 MG tablet Take 1 tablet (25 mg total) by mouth daily. 90 tablet 3  ? thiamine 100 MG tablet Take 1 tablet (100 mg total) daily by mouth.    ? vitamin B-12 500 MCG tablet Take 1 tablet (500 mcg total) daily by mouth.    ? ?No facility-administered medications prior to visit.  ? ? ?Allergies  ?Allergen Reactions  ? Chlorpheniramine   ? Penicillins Swelling  ?  Has patient had a PCN reaction causing immediate rash, facial/tongue/throat swelling, SOB or lightheadedness with hypotension: Yes ?Has patient had a PCN reaction causing severe rash involving mucus membranes or skin necrosis: No ?Has patient had a PCN reaction that required hospitalization: No ?Has patient had a PCN reaction occurring within the last 10 years: No ?If all of the above answers are "NO", then may proceed with Cephalosporin use. ?Other reaction(s): SWELLING  ? Other Itching and Other (See Comments)  ?  "mycin" doesn't know which one ?"mycin" doesn't know which one  ? ? ?ROS ?Review of Systems  ?Constitutional:  Negative for activity change, appetite change and fatigue.  ?HENT:  Negative for congestion and postnasal drip.   ?Eyes:  Negative for discharge and redness.  ?Respiratory:  Negative for chest tightness and shortness of breath.   ?Cardiovascular:  Negative for chest pain and palpitations.  ?Gastrointestinal:  Negative for abdominal distention and diarrhea.  ?Genitourinary:  Negative for difficulty urinating, frequency and vaginal discharge.  ?     Vaginal itching  ?Musculoskeletal:  Positive for neck stiffness. Negative for arthralgias.  ?Skin:  Negative for color change, pallor and rash.  ?Neurological:  Negative for dizziness, facial asymmetry and headaches.  ?Psychiatric/Behavioral:  Negative  for agitation, behavioral problems and confusion.   ? ?  ?Objective:  ?  ?Physical Exam ?Constitutional:   ?   Appearance: Normal appearance.  ?HENT:  ?   Head: Normocephalic and atraumatic.  ?   Right Ear: Tympanic membrane normal.  ?   Left Ear: Tympanic membrane normal.  ?   Nose: Nose normal.  ?   Mouth/Throat:  ?   Mouth: Mucous membranes are moist.  ?   Pharynx:  Oropharynx is clear.  ?Eyes:  ?   Conjunctiva/sclera: Conjunctivae normal.  ?   Pupils: Pupils are equal, round, and reactive to light.  ?Cardiovascular:  ?   Rate and Rhythm: Normal rate and regular rhythm.  ?   Pulses: Normal pulses.  ?   Heart sounds: Normal heart sounds.  ?Pulmonary:  ?   Effort: Pulmonary effort is normal.  ?   Breath sounds: Normal breath sounds.  ?Abdominal:  ?   General: Bowel sounds are normal.  ?   Palpations: Abdomen is soft.  ?Musculoskeletal:     ?   General: Normal range of motion.  ?Skin: ?   General: Skin is warm.  ?   Capillary Refill: Capillary refill takes less than 2 seconds.  ?Neurological:  ?   General: No focal deficit present.  ?   Mental Status: She is alert and oriented to person, place, and time. Mental status is at baseline.  ?Psychiatric:     ?   Mood and Affect: Mood normal.     ?   Behavior: Behavior normal.     ?   Thought Content: Thought content normal.     ?   Judgment: Judgment normal.  ? ? ?BP (!) 141/93   Pulse 80   Ht '5\' 6"'$  (1.676 m)   Wt 155 lb (70.3 kg)   BMI 25.02 kg/m?  ?Wt Readings from Last 3 Encounters:  ?07/27/21 155 lb (70.3 kg)  ?04/11/21 153 lb 8 oz (69.6 kg)  ?02/08/21 151 lb 3.2 oz (68.6 kg)  ? ? ? ?Health Maintenance Due  ?Topic Date Due  ? COVID-19 Vaccine (5 - Booster for Pfizer series) 12/17/2020  ? ? ?There are no preventive care reminders to display for this patient. ? ?Lab Results  ?Component Value Date  ? TSH 2.465 02/15/2021  ? ?Lab Results  ?Component Value Date  ? WBC 6.0 02/15/2021  ? HGB 12.5 02/15/2021  ? HCT 37.8 02/15/2021  ? MCV 95.2 02/15/2021  ? PLT 219  02/15/2021  ? ?Lab Results  ?Component Value Date  ? NA 139 02/15/2021  ? K 3.6 02/15/2021  ? CO2 29 02/15/2021  ? GLUCOSE 92 02/15/2021  ? BUN 15 02/15/2021  ? CREATININE 0.64 02/15/2021  ? BILITOT 1.0 02/15/2021  ? A

## 2021-08-16 ENCOUNTER — Telehealth: Payer: Self-pay

## 2021-08-16 NOTE — Telephone Encounter (Signed)
Pt lmovm requesting advice...  ? ?Pt reports she is having LUQ abdominal discomfort when she is working in the yard, but not after eating... Pt is taking Pantoprazole BID as recommended in March... Pt is concerned due to the warning label as well stating that she should not take Rx more than 2 weeks... ? ?Please advise   ?

## 2021-08-29 NOTE — Telephone Encounter (Signed)
Left message on voicemail.

## 2021-08-30 MED ORDER — PANTOPRAZOLE SODIUM 40 MG PO TBEC: 40.0000 mg | DELAYED_RELEASE_TABLET | Freq: Two times a day (BID) | ORAL | 1 refills | Status: DC

## 2021-08-30 NOTE — Addendum Note (Signed)
Addended by: Lurlean Nanny on: 08/30/2021 12:17 PM   Modules accepted: Orders

## 2021-10-14 DIAGNOSIS — G4733 Obstructive sleep apnea (adult) (pediatric): Secondary | ICD-10-CM | POA: Insufficient documentation

## 2021-10-14 DIAGNOSIS — K589 Irritable bowel syndrome without diarrhea: Secondary | ICD-10-CM | POA: Insufficient documentation

## 2021-10-19 ENCOUNTER — Ambulatory Visit: Payer: Medicare Other | Admitting: Nurse Practitioner

## 2021-10-19 ENCOUNTER — Encounter: Payer: Self-pay | Admitting: Nurse Practitioner

## 2021-10-19 VITALS — BP 140/87 | HR 74 | Ht 66.0 in | Wt 152.3 lb

## 2021-10-19 DIAGNOSIS — M542 Cervicalgia: Secondary | ICD-10-CM

## 2021-10-19 NOTE — Progress Notes (Unsigned)
Established Patient Office Visit  Subjective:  Patient ID: Sonya Park, female    DOB: [birth year]  Age: 81 y.o. MRN: [id number]  CC:  Chief Complaint  Patient presents with   Neck Pain    Patient has seen Dr. Marry Guan for this and was given meds for muscle spasms.      HPI  Sonya Park presents for neck pain and spasm going on from almost 1 week.  She she has history of cervical spondylosis and states limited range of motion to the right side of neck.  She is taking metaxalone 800 mg as needed for spasm.  She denies weakness, numbness of one side. Neck Pain  Pertinent negatives include no chest pain or headaches.     Past Medical History:  Diagnosis Date   Arthritis    Back pain    Insomnia    Medical history non-contributory    Osteoporosis     Past Surgical History:  Procedure Laterality Date   ABDOMINAL HYSTERECTOMY     BREAST LUMPECTOMY Right    COLONOSCOPY WITH PROPOFOL N/A 07/18/2015   Procedure: COLONOSCOPY WITH PROPOFOL;  Surgeon: [name], MD;  Location: [facility];  Service: Endoscopy;  Laterality: N/A;   COSMETIC SURGERY     GASTRIC BYPASS     X2   HEMORROIDECTOMY     KNEE ARTHROPLASTY Right 12/13/2015   Procedure: COMPUTER ASSISTED TOTAL KNEE ARTHROPLASTY;  Surgeon: Dereck Leep, MD;  Location: [facility];  Service: Orthopedics;  Laterality: Right;   KNEE ARTHROSCOPY     REPLACEMENT TOTAL KNEE Left     Family History  Problem Relation Age of Onset   Liver disease Mother    Heart attack Father    Cancer Father    CAD Sister     Social History   Socioeconomic History   Marital status: Married    Spouse name: Shevon Sian   Number of children: 1   Years of education: 12   Highest education level: Some college, no degree  Occupational History   Occupation: Retired  Tobacco Use   Smoking status: Former    Packs/day: 2.00    Years: 29.00    Total pack years: 58.00    Types: Cigarettes    Quit date: 11/30/1994    Years since  quitting: 26.9   Smokeless tobacco: Never  Vaping Use   Vaping Use: Never used  Substance and Sexual Activity   Alcohol use: Not Currently    Alcohol/week: 21.0 - 35.0 standard drinks of alcohol    Types: 21 - 35 Glasses of wine per week    Comment: 3-5 glasses of wine/ day   Drug use: No   Sexual activity: Not Currently  Other Topics Concern   Not on file  Social History Narrative   Not on file   Social Determinants of Health   Financial Resource Strain: Low Risk  (02/16/2021)   Overall Financial Resource Strain (CARDIA)    Difficulty of Paying Living Expenses: Not hard at all  Food Insecurity: No Food Insecurity (02/16/2021)   Hunger Vital Sign    Worried About Running Out of Food in the Last Year: Never true    [place] in the Last Year: Never true  Transportation Needs: No Transportation Needs (02/16/2021)   PRAPARE - Hydrologist (Medical): No    Lack of Transportation (Non-Medical): No  Physical Activity: Sufficiently Active (02/16/2021)   Exercise Vital Sign  Days of Exercise per Week: 3 days    Minutes of Exercise per Session: 60 min  Stress: No Stress Concern Present (02/16/2021)   [place]    Feeling of Stress : Not at all  Social Connections: [place] (02/16/2021)   Social Connection and Isolation Panel [NHANES]    Frequency of Communication with Friends and Family: More than three times a week    Frequency of Social Gatherings with Friends and Family: Once a week    Attends Religious Services: More than 4 times per year    Active Member of [employer] or Organizations: Yes    Attends Music therapist: More than 4 times per year    Marital Status: Married  Human resources officer Violence: Not At Risk (02/16/2021)   Humiliation, Afraid, Rape, and Kick questionnaire    Fear of Current or Ex-Partner: No    Emotionally Abused: No    Physically  Abused: No    Sexually Abused: No     Outpatient Medications Prior to Visit  Medication Sig Dispense Refill   aspirin 81 MG tablet Take 81 mg by mouth daily. Once a week (every Sunday)     b complex vitamins capsule Take by mouth.     calcium citrate-vitamin D (CITRACAL+D) 315-200 MG-UNIT tablet Take by mouth.     Cholecalciferol 125 MCG (5000 UT) TABS Take by mouth.     Copper Gluconate 2 MG CAPS Take 4 mg by mouth daily. 60 capsule 3   FEROSUL 325 (65 Fe) MG tablet TAKE 1 TABLET DAILY 90 tablet 3   ibandronate (BONIVA) 150 MG tablet Take 1 tablet (150 mg total) by mouth every 30 (thirty) days. 4 tablet 3   Melatonin 5 MG TABS Take 5 mg by mouth. As needed     metaxalone (SKELAXIN) 800 MG tablet Take 800 mg by mouth 3 (three) times daily as needed.     multivitamin-lutein (OCUVITE-LUTEIN) CAPS capsule Take 1 capsule daily by mouth.  0   nystatin-triamcinolone ointment (MYCOLOG) Apply 1 application. topically 2 (two) times daily. 30 g 0   pantoprazole (PROTONIX) 40 MG tablet Take 1 tablet (40 mg total) by mouth 2 (two) times daily before a meal. 180 tablet 1   spironolactone (ALDACTONE) 25 MG tablet Take 1 tablet (25 mg total) by mouth daily. 90 tablet 3   thiamine 100 MG tablet Take 1 tablet (100 mg total) daily by mouth.     vitamin B-12 500 MCG tablet Take 1 tablet (500 mcg total) daily by mouth.     No facility-administered medications prior to visit.    Allergies  Allergen Reactions   Chlorpheniramine    Penicillins Swelling    Has patient had a PCN reaction causing immediate rash, facial/tongue/throat swelling, SOB or lightheadedness with hypotension: Yes Has patient had a PCN reaction causing severe rash involving mucus membranes or skin necrosis: No Has patient had a PCN reaction that required hospitalization: No Has patient had a PCN reaction occurring within the last 10 years: No If all of the above answers are "NO", then may proceed with Cephalosporin use. Other  reaction(s): SWELLING   Other Itching and Other (See Comments)    "mycin" doesn't know which one "mycin" doesn't know which one    ROS Review of Systems  Constitutional:  Negative for activity change, appetite change and fatigue.  HENT:  Negative for congestion and postnasal drip.   Eyes:  Negative for discharge and redness.  Respiratory:  Negative for chest tightness and shortness of breath.   Cardiovascular:  Negative for chest pain and palpitations.  Gastrointestinal:  Negative for abdominal distention and diarrhea.  Genitourinary:  Negative for difficulty urinating, frequency and vaginal discharge.  Musculoskeletal:  Positive for neck pain and neck stiffness. Negative for arthralgias.  Skin:  Negative for color change, pallor and rash.  Neurological:  Negative for dizziness, facial asymmetry and headaches.  Psychiatric/Behavioral:  Negative for agitation, behavioral problems and confusion.       Objective:    Physical Exam Constitutional:      Appearance: Normal appearance.  HENT:     Head: Normocephalic and atraumatic.     Right Ear: Tympanic membrane normal.     Left Ear: Tympanic membrane normal.     Nose: Nose normal.     Mouth/Throat:     Mouth: Mucous membranes are moist.     Pharynx: Oropharynx is clear.  Eyes:     Conjunctiva/sclera: Conjunctivae normal.     Pupils: Pupils are equal, round, and reactive to light.  Cardiovascular:     Rate and Rhythm: Normal rate and regular rhythm.     Pulses: Normal pulses.     Heart sounds: Normal heart sounds.  Pulmonary:     Effort: Pulmonary effort is normal.     Breath sounds: Normal breath sounds.  Abdominal:     General: Bowel sounds are normal.     Palpations: Abdomen is soft.  Musculoskeletal:        General: Normal range of motion.     Cervical back: Rigidity present.  Skin:    General: Skin is warm.     Capillary Refill: Capillary refill takes less than 2 seconds.  Neurological:     General: No focal  deficit present.     Mental Status: She is alert and oriented to person, place, and time. Mental status is at baseline.  Psychiatric:        Mood and Affect: Mood normal.        Behavior: Behavior normal.        Thought Content: Thought content normal.        Judgment: Judgment normal.     BP 140/87   Pulse 74   Ht '5\' 6"'$  (1.676 m)   Wt 152 lb 4.8 oz (69.1 kg)   BMI 24.58 kg/m  Wt Readings from Last 3 Encounters:  10/19/21 152 lb 4.8 oz (69.1 kg)  07/27/21 155 lb (70.3 kg)  04/11/21 153 lb 8 oz (69.6 kg)     Health Maintenance Due  Topic Date Due   COVID-19 Vaccine (5 - Booster for Pfizer series) 12/17/2020    There are no preventive care reminders to display for this patient.  Lab Results  Component Value Date   TSH 2.465 02/15/2021   Lab Results  Component Value Date   WBC 6.0 02/15/2021   HGB 12.5 02/15/2021   HCT 37.8 02/15/2021   MCV 95.2 02/15/2021   PLT 219 02/15/2021   Lab Results  Component Value Date   NA 139 02/15/2021   K 3.6 02/15/2021   CO2 29 02/15/2021   GLUCOSE 92 02/15/2021   BUN 15 02/15/2021   CREATININE 0.64 02/15/2021   BILITOT 1.0 02/15/2021   ALKPHOS 53 02/15/2021   AST 24 02/15/2021   ALT 18 02/15/2021   PROT 6.5 02/15/2021   ALBUMIN 3.8 02/15/2021   CALCIUM 8.6 (L) 02/15/2021   ANIONGAP 5 02/15/2021   Lab Results  Component Value Date   CHOL 208 (H) 02/15/2021   Lab Results  Component Value Date   HDL 78 02/15/2021   Lab Results  Component Value Date   LDLCALC 105 (H) 02/15/2021   Lab Results  Component Value Date   TRIG 124 02/15/2021   Lab Results  Component Value Date   CHOLHDL 2.7 02/15/2021   Lab Results  Component Value Date   HGBA1C 4.8 04/11/2021      Assessment & Plan:   Problem List Items Addressed This Visit       Other   Neck pain - Primary    Advised patient to continue the muscle relaxant as needed. Encouraged her to use a good posture when sitting and standing, performing neck  exercises daily and alternating heat and cold pack on the neck. We will continue to monitor.       No orders of the defined types were placed in this encounter.    Follow-up: No follow-ups on file.    Theresia Lo, NP

## 2021-10-24 ENCOUNTER — Encounter: Payer: Self-pay | Admitting: Nurse Practitioner

## 2021-10-24 DIAGNOSIS — M542 Cervicalgia: Secondary | ICD-10-CM | POA: Insufficient documentation

## 2021-10-24 NOTE — Assessment & Plan Note (Signed)
Advised patient to continue the muscle relaxant as needed. Encouraged her to use a good posture when sitting and standing, performing neck exercises daily and alternating heat and cold pack on the neck. We will continue to monitor.

## 2021-11-07 ENCOUNTER — Other Ambulatory Visit: Payer: Self-pay | Admitting: Internal Medicine

## 2022-01-01 ENCOUNTER — Other Ambulatory Visit: Payer: Self-pay | Admitting: Internal Medicine

## 2022-01-11 ENCOUNTER — Ambulatory Visit (INDEPENDENT_AMBULATORY_CARE_PROVIDER_SITE_OTHER): Payer: Medicare Other | Admitting: *Deleted

## 2022-01-11 DIAGNOSIS — Z23 Encounter for immunization: Secondary | ICD-10-CM | POA: Diagnosis not present

## 2022-02-03 ENCOUNTER — Inpatient Hospital Stay
Admission: EM | Admit: 2022-02-03 | Discharge: 2022-02-04 | DRG: 536 | Disposition: A | Discharge status: Other Acute Inpatient Hospital | Payer: Medicare Other | Attending: Internal Medicine | Admitting: Internal Medicine

## 2022-02-03 ENCOUNTER — Other Ambulatory Visit: Payer: Self-pay

## 2022-02-03 ENCOUNTER — Emergency Department: Payer: Medicare Other

## 2022-02-03 DIAGNOSIS — D696 Thrombocytopenia, unspecified: Secondary | ICD-10-CM | POA: Diagnosis present

## 2022-02-03 DIAGNOSIS — Z96653 Presence of artificial knee joint, bilateral: Secondary | ICD-10-CM | POA: Diagnosis present

## 2022-02-03 DIAGNOSIS — Z79899 Other long term (current) drug therapy: Secondary | ICD-10-CM | POA: Diagnosis not present

## 2022-02-03 DIAGNOSIS — F1010 Alcohol abuse, uncomplicated: Secondary | ICD-10-CM | POA: Diagnosis present

## 2022-02-03 DIAGNOSIS — Y92009 Unspecified place in unspecified non-institutional (private) residence as the place of occurrence of the external cause: Secondary | ICD-10-CM | POA: Diagnosis not present

## 2022-02-03 DIAGNOSIS — Z880 Allergy status to penicillin: Secondary | ICD-10-CM

## 2022-02-03 DIAGNOSIS — Z888 Allergy status to other drugs, medicaments and biological substances status: Secondary | ICD-10-CM | POA: Diagnosis not present

## 2022-02-03 DIAGNOSIS — S4291XA Fracture of right shoulder girdle, part unspecified, initial encounter for closed fracture: Secondary | ICD-10-CM | POA: Diagnosis present

## 2022-02-03 DIAGNOSIS — Z87891 Personal history of nicotine dependence: Secondary | ICD-10-CM | POA: Diagnosis not present

## 2022-02-03 DIAGNOSIS — W19XXXA Unspecified fall, initial encounter: Secondary | ICD-10-CM

## 2022-02-03 DIAGNOSIS — I10 Essential (primary) hypertension: Secondary | ICD-10-CM | POA: Diagnosis present

## 2022-02-03 DIAGNOSIS — Z7982 Long term (current) use of aspirin: Secondary | ICD-10-CM

## 2022-02-03 DIAGNOSIS — S72141A Displaced intertrochanteric fracture of right femur, initial encounter for closed fracture: Principal | ICD-10-CM | POA: Diagnosis present

## 2022-02-03 DIAGNOSIS — D520 Dietary folate deficiency anemia: Secondary | ICD-10-CM | POA: Diagnosis present

## 2022-02-03 DIAGNOSIS — W010XXA Fall on same level from slipping, tripping and stumbling without subsequent striking against object, initial encounter: Secondary | ICD-10-CM | POA: Diagnosis present

## 2022-02-03 DIAGNOSIS — G4700 Insomnia, unspecified: Secondary | ICD-10-CM | POA: Diagnosis present

## 2022-02-03 DIAGNOSIS — S72001A Fracture of unspecified part of neck of right femur, initial encounter for closed fracture: Secondary | ICD-10-CM | POA: Diagnosis present

## 2022-02-03 DIAGNOSIS — D539 Nutritional anemia, unspecified: Secondary | ICD-10-CM | POA: Diagnosis present

## 2022-02-03 DIAGNOSIS — R296 Repeated falls: Secondary | ICD-10-CM | POA: Diagnosis present

## 2022-02-03 DIAGNOSIS — S42291A Other displaced fracture of upper end of right humerus, initial encounter for closed fracture: Secondary | ICD-10-CM | POA: Diagnosis present

## 2022-02-03 DIAGNOSIS — M810 Age-related osteoporosis without current pathological fracture: Secondary | ICD-10-CM | POA: Diagnosis present

## 2022-02-03 LAB — BASIC METABOLIC PANEL
Anion gap: 8 (ref 5–15)
BUN: 23 mg/dL (ref 8–23)
CO2: 24 mmol/L (ref 22–32)
Calcium: 7.9 mg/dL — ABNORMAL LOW (ref 8.9–10.3)
Chloride: 106 mmol/L (ref 98–111)
Creatinine, Ser: 0.75 mg/dL (ref 0.44–1.00)
GFR, Estimated: >60 mL/min (ref >60)
Glucose, Bld: 115 mg/dL — ABNORMAL HIGH (ref 70–99)
Potassium: 3.9 mmol/L (ref 3.5–5.1)
Sodium: 138 mmol/L (ref 135–145)

## 2022-02-03 LAB — CBC
HCT: 36.4 % (ref 36.0–46.0)
Hemoglobin: 11.5 g/dL — ABNORMAL LOW (ref 12.0–15.0)
MCH: 29.3 pg (ref 26.0–34.0)
MCHC: 31.6 g/dL (ref 30.0–36.0)
MCV: 92.6 fL (ref 80.0–100.0)
Platelets: 240 10*3/uL (ref 150–400)
RBC: 3.93 MIL/uL (ref 3.87–5.11)
RDW: 14.5 % (ref 11.5–15.5)
WBC: 10.1 10*3/uL (ref 4.0–10.5)
nRBC: 0 % (ref 0.0–0.2)

## 2022-02-03 LAB — SAMPLE TO BLOOD BANK

## 2022-02-03 MED ORDER — PANTOPRAZOLE SODIUM 40 MG IV SOLR
40.0000 mg | Freq: Two times a day (BID) | INTRAVENOUS | Status: DC
  Administered 2022-02-03 – 2022-02-04 (×2): 40 mg via INTRAVENOUS
  Filled 2022-02-03 (×2): qty 10

## 2022-02-03 MED ORDER — MORPHINE SULFATE (PF) 4 MG/ML IV SOLN
4.0000 mg | Freq: Once | INTRAVENOUS | Status: AC
  Administered 2022-02-03: 4 mg via INTRAVENOUS
  Filled 2022-02-03: qty 1

## 2022-02-03 MED ORDER — DOCUSATE SODIUM 100 MG PO CAPS
100.0000 mg | ORAL_CAPSULE | Freq: Two times a day (BID) | ORAL | Status: DC
  Administered 2022-02-04: 100 mg via ORAL
  Filled 2022-02-03 (×2): qty 1

## 2022-02-03 MED ORDER — SPIRONOLACTONE 25 MG PO TABS
25.0000 mg | ORAL_TABLET | Freq: Every day | ORAL | Status: DC
  Administered 2022-02-04: 25 mg via ORAL
  Filled 2022-02-03: qty 1

## 2022-02-03 MED ORDER — POLYETHYLENE GLYCOL 3350 17 G PO PACK: 17.0000 g | PACK | Freq: Every day | ORAL | Status: DC | PRN

## 2022-02-03 MED ORDER — THIAMINE HCL 100 MG PO TABS
100.0000 mg | ORAL_TABLET | Freq: Every day | ORAL | Status: DC
  Administered 2022-02-04: 100 mg via ORAL
  Filled 2022-02-03 (×2): qty 1

## 2022-02-03 MED ORDER — METHOCARBAMOL 500 MG PO TABS
500.0000 mg | ORAL_TABLET | Freq: Four times a day (QID) | ORAL | Status: DC | PRN
  Administered 2022-02-03: 500 mg via ORAL
  Filled 2022-02-03 (×3): qty 1

## 2022-02-03 MED ORDER — MORPHINE SULFATE (PF) 2 MG/ML IV SOLN
2.0000 mg | INTRAVENOUS | Status: DC | PRN
  Administered 2022-02-03 – 2022-02-04 (×4): 2 mg via INTRAVENOUS
  Filled 2022-02-03 (×4): qty 1

## 2022-02-03 MED ORDER — METHOCARBAMOL 1000 MG/10ML IJ SOLN: 500.0000 mg | Freq: Four times a day (QID) | INTRAVENOUS | Status: DC | PRN

## 2022-02-03 MED ORDER — BISACODYL 5 MG PO TBEC: 5.0000 mg | DELAYED_RELEASE_TABLET | Freq: Every day | ORAL | Status: DC | PRN

## 2022-02-03 MED ORDER — FENTANYL CITRATE PF 50 MCG/ML IJ SOSY
50.0000 ug | PREFILLED_SYRINGE | INTRAMUSCULAR | Status: DC | PRN
  Administered 2022-02-03 – 2022-02-04 (×9): 50 ug via INTRAVENOUS
  Filled 2022-02-03 (×9): qty 1

## 2022-02-03 NOTE — Assessment & Plan Note (Addendum)
    Latest Ref Rng & Units 02/03/2022    7:01 PM 02/15/2021    8:23 AM 02/15/2020    3:28 PM  CBC  WBC 4.0 - 10.5 K/uL 10.1  6.0  6.0   Hemoglobin 12.0 - 15.0 g/dL 11.5  12.5  12.2   Hematocrit 36.0 - 46.0 % 36.4  37.8  39.0   Platelets 150 - 400 K/uL 240  219  280   Resolved. Antiplatelet therapy currently held secondary to anticipated IR procedure.

## 2022-02-03 NOTE — Assessment & Plan Note (Signed)
No report of any alcohol or tobacco. CIWA precautions and thiamine continued.

## 2022-02-03 NOTE — Assessment & Plan Note (Signed)
Per patient's description sounds mechanical fall as she tripped over the leaf blower. Orthopedic assessment consulted Dr. Leim Fabry is to manage.   Case management consult for discharge dispo and options.

## 2022-02-03 NOTE — ED Notes (Signed)
With CT 

## 2022-02-03 NOTE — ED Provider Notes (Signed)
[facility] Provider Note    Event Date/Time   First MD Initiated Contact with Patient 02/03/22 1836     (approximate)   History   Fall   HPI  Sonya Park is a 81 y.o. female remote history of alcohol abuse but denies any diagnosis of cirrhosis but not on any blood thinners presents to the ER for evaluation of right shoulder pain head injury and right hip pain that occurred while she was outside lost her balance while using portable leaf blower fell against a glass door did not break the glass and fell to the porch.  Did strike her head and right shoulder and hip unable to ambulate secondary to severe pain.  Was given 150 mcg of fentanyl via EMS.     Physical Exam   Triage Vital Signs: ED Triage Vitals  Enc Vitals Group     BP 02/03/22 1835 (!) 161/86     Pulse Rate 02/03/22 1835 80     Resp 02/03/22 1835 18     Temp 02/03/22 1851 98.7 F (37.1 C)     Temp Source 02/03/22 1851 Oral     SpO2 02/03/22 1835 95 %     Weight 02/03/22 1837 152 lb (68.9 kg)     Height 02/03/22 1837 '5\' 6"'$  (1.676 m)     Head Circumference --      Peak Flow --      Pain Score 02/03/22 1836 10     Pain Loc --      Pain Edu? --      Excl. in [place]? --     Most recent vital signs: Vitals:   02/03/22 2130 02/03/22 2200  BP: (!) 142/77 129/69  Pulse: 83 83  Resp: 17 17  Temp:    SpO2: 100% 100%     Constitutional: Alert  Eyes: Conjunctivae are normal.  Head: Atraumatic. Nose: No congestion/rhinnorhea. Mouth/Throat: Mucous membranes are moist.   Neck: Painless ROM.  Cardiovascular:   Good peripheral circulation. Respiratory: Normal respiratory effort.  No retractions.  Gastrointestinal: Soft and nontender.  Musculoskeletal: Deformity swelling of the right deltoid.  No open laceration or contusion.  Neurovascular intact distally.  There is shortening and external rotation of the right lower extremity.  Neurovascular intact.  Thigh compartment is  soft. Neurologic:  MAE spontaneously. No gross focal neurologic deficits are appreciated.  Skin:  Skin is warm, dry and intact. No rash noted. Psychiatric: Mood and affect are normal. Speech and behavior are normal.    ED Results / Procedures / Treatments   Labs (all labs ordered are listed, but only abnormal results are displayed) Labs Reviewed  BASIC METABOLIC PANEL - Abnormal; Notable for the following components:      Result Value   Glucose, Bld 115 (*)    Calcium 7.9 (*)    All other components within normal limits  CBC - Abnormal; Notable for the following components:   Hemoglobin 11.5 (*)    All other components within normal limits  SAMPLE TO BLOOD BANK     EKG  ED ECG REPORT I, Merlyn Lot, the attending physician, personally viewed and interpreted this ECG.   Date: 02/03/2022  EKG Time: 21:30  Rate: 85  Rhythm: sinus  Axis: left  Intervals:normal qt  ST&T Change: no stemi, no depressions.    RADIOLOGY Please see ED Course for my review and interpretation.  I personally reviewed all radiographic images ordered to evaluate for the above acute complaints  and reviewed radiology reports and findings.  These findings were personally discussed with the patient.  Please see medical record for radiology report.    PROCEDURES:  Critical Care performed: No  Procedures   MEDICATIONS ORDERED IN ED: Medications  fentaNYL (SUBLIMAZE) injection 50 mcg (50 mcg Intravenous Given 02/03/22 2133)  morphine (PF) 4 MG/ML injection 4 mg (4 mg Intravenous Given 02/03/22 1917)     IMPRESSION / MDM / ASSESSMENT AND PLAN / ED COURSE  I reviewed the triage vital signs and the nursing notes.                              Differential diagnosis includes, but is not limited to, SDH, IPH, fracture, dislocation, contusion, abrasion, electrolyte abnormality  Patient presenting to the ER for evaluation of symptoms as described above.  Based on symptoms, risk factors and  considered above differential, this presenting complaint could reflect a potentially life-threatening illness therefore the patient will be placed on continuous pulse oximetry and telemetry for monitoring.  Laboratory evaluation will be sent to evaluate for the above complaints.  imaging will be ordered for the blood differential   Clinical Course as of 02/03/22 2348  Sun Feb 03, 2022  1952 X-ray of the right hip as well as shoulder show evidence of comminuted displaced fractures of the right proximal humerus as well as intertrochanteric fracture of the right hip.  Right thigh compartments soft.  Will consult Ortho. [PR]  2035 Case was discussed in consultation with Dr. Posey Pronto who is recommended CT of the shoulder as well as right hip.  We will plan for operative management of the right hip tomorrow. [PR]  2347 Was contacted by Dr. Posey Pronto after he reviewed CT imaging.  Due to the extent of her fracture would not be able to operatively manage at this facility.  As recommended transfer.  Family and patient request to [facility]  I consulted [facility] but they are closed due to capacity.  Will consult [facility]. [PR]    Clinical Course User Index [PR] Merlyn Lot, MD     FINAL CLINICAL IMPRESSION(S) / ED DIAGNOSES   Final diagnoses:  Closed displaced intertrochanteric fracture of right femur, initial encounter [facility])  Closed fracture of head of right humerus, initial encounter     Rx / DC Orders   ED Discharge Orders     None        Note:  This document was prepared using Dragon voice recognition software and may include unintentional dictation errors.    Merlyn Lot, MD 02/03/22 2218

## 2022-02-03 NOTE — ED Notes (Signed)
Called [facility] transfer [facility] [place]) per  Quentin Cornwall, MD for ortho transfer.  Images power shared

## 2022-02-03 NOTE — ED Triage Notes (Signed)
Pt from home to ER via EMS. Pt tripped over leaf blower landing on right side. Pt admits to hitting head but no LOC. Obvious deformity to Right leg, Right shoulder appears to be asymmetrical.  Vitals: O2 97% BP 152/86 HR 84 AOx4

## 2022-02-03 NOTE — Assessment & Plan Note (Signed)
Vitals:   02/03/22 1835 02/03/22 1915 02/03/22 2035 02/03/22 2100  BP: (!) 161/86 (!) 154/108 (!) 142/77 (!) 154/81   02/03/22 2130 02/03/22 2200 02/03/22 2230  BP: (!) 142/77 129/69 132/66  We will continue patient on her Aldactone. Monitor blood pressure.

## 2022-02-03 NOTE — ED Notes (Addendum)
Pt via [facility] from home. Pt had a mechanical fall today. Pt tripped over a leaf blower and fell on her R side. Pt did have head injury but denies any LOC. Denies blood thinners. Pt c/o R hip pain and R shoulder pain. Obvious shortening and external rotation to the R hip.

## 2022-02-03 NOTE — Assessment & Plan Note (Signed)
Patient also found to have a three-part fracture of the right humerus. Currently in a sling, as needed pain control, orthopedics consult.

## 2022-02-03 NOTE — ED Notes (Signed)
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

## 2022-02-03 NOTE — Assessment & Plan Note (Signed)
Mild anemia. We will type and screen and follow.

## 2022-02-03 NOTE — H&P (Signed)
History and Physical    Chief Complaint: Fall    HISTORY OF PRESENT ILLNESS: Sonya Park is an 81 y.o. female  seen in ed for mechanical fall while blowing heart leaves  Patient tripped over the left lower and landed on her right side and did hit her head but did not lose consciousness. X-ray imaging of the hip and the shoulder shows comminuted displaced fracture of the proximal right proximal humerus as well as an intertrochanteric fracture of the right hip. Orthopedic consulted Dr. Posey Pronto is seeing patient with plans for operative repair tomorrow morning.   Pt has PMH as below: Past Medical History:  Diagnosis Date   Arthritis    Back pain    Insomnia    Medical history non-contributory    Osteoporosis      Review of Systems  Musculoskeletal:  Positive for joint swelling.  All other systems reviewed and are negative.     Allergies  Allergen Reactions   Chlorpheniramine    Penicillins Swelling    Has patient had a PCN reaction causing immediate rash, facial/tongue/throat swelling, SOB or lightheadedness with hypotension: Yes Has patient had a PCN reaction causing severe rash involving mucus membranes or skin necrosis: No Has patient had a PCN reaction that required hospitalization: No Has patient had a PCN reaction occurring within the last 10 years: No If all of the above answers are "NO", then may proceed with Cephalosporin use. Other reaction(s): SWELLING   Other Itching and Other (See Comments)    "mycin" doesn't know which one "mycin" doesn't know which one     Past Surgical History:  Procedure Laterality Date   ABDOMINAL HYSTERECTOMY     BREAST LUMPECTOMY Right    COLONOSCOPY WITH PROPOFOL N/A 07/18/2015   Procedure: COLONOSCOPY WITH PROPOFOL;  Surgeon: [name], MD;  Location: [facility] ENDOSCOPY;  Service: Endoscopy;  Laterality: N/A;   COSMETIC SURGERY     GASTRIC BYPASS     X2   HEMORROIDECTOMY     KNEE ARTHROPLASTY Right 12/13/2015   Procedure:  COMPUTER ASSISTED TOTAL KNEE ARTHROPLASTY;  Surgeon: Dereck Leep, MD;  Location: [facility] ORS;  Service: Orthopedics;  Laterality: Right;   KNEE ARTHROSCOPY     REPLACEMENT TOTAL KNEE Left       Social History   Socioeconomic History   Marital status: Married    Spouse name: Latash Nouri   Number of children: 1   Years of education: 12   Highest education level: Some college, no degree  Occupational History   Occupation: Retired  Tobacco Use   Smoking status: Former    Packs/day: 2.00    Years: 29.00    Total pack years: 58.00    Types: Cigarettes    Quit date: 11/30/1994    Years since quitting: 27.1   Smokeless tobacco: Never  Vaping Use   Vaping Use: Never used  Substance and Sexual Activity   Alcohol use: Not Currently    Alcohol/week: 21.0 - 35.0 standard drinks of alcohol    Types: 21 - 35 Glasses of wine per week    Comment: 3-5 glasses of wine/ day   Drug use: No   Sexual activity: Not Currently  Other Topics Concern   Not on file  Social History Narrative   Not on file   Social Determinants of Health   Financial Resource Strain: Low Risk  (02/16/2021)   Overall Financial Resource Strain (CARDIA)    Difficulty of Paying Living Expenses: Not hard at all  Food Insecurity: No Food Insecurity (02/16/2021)   Hunger Vital Sign    Worried About Running Out of Food in the Last Year: Never true    Ran Out of Food in the Last Year: Never true  Transportation Needs: No Transportation Needs (02/16/2021)   PRAPARE - Hydrologist (Medical): No    Lack of Transportation (Non-Medical): No  Physical Activity: Sufficiently Active (02/16/2021)   Exercise Vital Sign    Days of Exercise per Week: 3 days    Minutes of Exercise per Session: 60 min  Stress: No Stress Concern Present (02/16/2021)   [place]    Feeling of Stress : Not at all  Social Connections: [place]  (02/16/2021)   Social Connection and Isolation Panel [NHANES]    Frequency of Communication with Friends and Family: More than three times a week    Frequency of Social Gatherings with Friends and Family: Once a week    Attends Religious Services: More than 4 times per year    Active Member of [employer] or Organizations: Yes    Attends Music therapist: More than 4 times per year    Marital Status: Married      CURRENT MEDS:   Current Outpatient Medications (Endocrine & Metabolic):    ibandronate (BONIVA) 150 MG tablet, TAKE 1 TABLET EVERY 30 DAYS  Current Facility-Administered Medications (Cardiovascular):    [START ON 02/04/2022] spironolactone (ALDACTONE) tablet 25 mg  Current Outpatient Medications (Cardiovascular):    spironolactone (ALDACTONE) 25 MG tablet, Take 1 tablet (25 mg total) by mouth daily.    Current Facility-Administered Medications (Analgesics):    fentaNYL (SUBLIMAZE) injection 50 mcg   morphine (PF) 2 MG/ML injection 2 mg  Current Outpatient Medications (Analgesics):    aspirin 81 MG tablet, Take 81 mg by mouth daily. Once a week (every Sunday)   Current Outpatient Medications (Hematological):    FEROSUL 325 (65 Fe) MG tablet, TAKE 1 TABLET DAILY   vitamin B-12 500 MCG tablet, Take 1 tablet (500 mcg total) daily by mouth.  Current Facility-Administered Medications (Other):    bisacodyl (DULCOLAX) EC tablet 5 mg   docusate sodium (COLACE) capsule 100 mg   methocarbamol (ROBAXIN) tablet 500 mg **OR** methocarbamol (ROBAXIN) 500 mg in dextrose 5 % 50 mL IVPB   pantoprazole (PROTONIX) injection 40 mg   polyethylene glycol (MIRALAX / GLYCOLAX) packet 17 g   [START ON 02/04/2022] thiamine (VITAMIN B1) tablet 100 mg  Current Outpatient Medications (Other):    b complex vitamins capsule, Take by mouth.   calcium citrate-vitamin D (CITRACAL+D) 315-200 MG-UNIT tablet, Take by mouth.   Cholecalciferol 125 MCG (5000 UT) TABS, Take by mouth.   Copper  Gluconate 2 MG CAPS, Take 4 mg by mouth daily.   Melatonin 5 MG TABS, Take 5 mg by mouth. As needed   metaxalone (SKELAXIN) 800 MG tablet, Take 800 mg by mouth 3 (three) times daily as needed.   multivitamin-lutein (OCUVITE-LUTEIN) CAPS capsule, Take 1 capsule daily by mouth.   nystatin-triamcinolone ointment (MYCOLOG), Apply 1 application. topically 2 (two) times daily.   pantoprazole (PROTONIX) 40 MG tablet, Take 1 tablet (40 mg total) by mouth 2 (two) times daily before a meal.   thiamine 100 MG tablet, Take 1 tablet (100 mg total) daily by mouth.    ED Course: Pt in Ed is alert awake oriented afebrile O2 sats 100% on room air.  Vitals:   02/03/22  2130 02/03/22 2200 02/03/22 2230 02/03/22 2247  BP: (!) 142/77 129/69 132/66   Pulse: 83 83 94   Resp: '17 17 17   '$ Temp:    98.4 F (36.9 C)  TempSrc:    Oral  SpO2: 100% 100% 95%   Weight:      Height:       No intake/output data recorded. SpO2: 95 % O2 Flow Rate (L/min): 2 L/min Blood work in ed shows  Results for orders placed or performed during the hospital encounter of 02/03/22 (from the past 48 hour(s))  Sample to Blood Bank     Status: None   Collection Time: 02/03/22  6:32 PM  Result Value Ref Range   Blood Bank Specimen SAMPLE AVAILABLE FOR TESTING    Sample Expiration      02/06/2022,2359 Performed at [facility], [address] [place], [place] [address]   Basic metabolic panel     Status: Abnormal   Collection Time: 02/03/22  7:01 PM  Result Value Ref Range   Sodium 138 135 - 145 mmol/L   Potassium 3.9 3.5 - 5.1 mmol/L   Chloride 106 98 - 111 mmol/L   CO2 24 22 - 32 mmol/L   Glucose, Bld 115 (H) 70 - 99 mg/dL    Comment: Glucose reference range applies only to samples taken after fasting for at least 8 hours.   BUN 23 8 - 23 mg/dL   Creatinine, Ser 0.75 0.44 - 1.00 mg/dL   Calcium 7.9 (L) 8.9 - 10.3 mg/dL   GFR, Estimated >60 >60 mL/min    Comment: (NOTE) Calculated using the CKD-EPI Creatinine  Equation (2021)    Anion gap 8 5 - 15    Comment: Performed at [facility], [address] [place], [place] [address]  CBC     Status: Abnormal   Collection Time: 02/03/22  7:01 PM  Result Value Ref Range   WBC 10.1 4.0 - 10.5 K/uL   RBC 3.93 3.87 - 5.11 MIL/uL   Hemoglobin 11.5 (L) 12.0 - 15.0 g/dL   HCT 36.4 36.0 - 46.0 %   MCV 92.6 80.0 - 100.0 fL   MCH 29.3 26.0 - 34.0 pg   MCHC 31.6 30.0 - 36.0 g/dL   RDW 14.5 11.5 - 15.5 %   Platelets 240 150 - 400 K/uL   nRBC 0.0 0.0 - 0.2 %    Comment: Performed at [facility], [address] [place], [place] [address]    In Ed pt received  Meds ordered this encounter  Medications   morphine (PF) 4 MG/ML injection 4 mg   fentaNYL (SUBLIMAZE) injection 50 mcg   OR Linked Order Group    methocarbamol (ROBAXIN) tablet 500 mg    methocarbamol (ROBAXIN) 500 mg in dextrose 5 % 50 mL IVPB   docusate sodium (COLACE) capsule 100 mg   polyethylene glycol (MIRALAX / GLYCOLAX) packet 17 g   bisacodyl (DULCOLAX) EC tablet 5 mg   morphine (PF) 2 MG/ML injection 2 mg   spironolactone (ALDACTONE) tablet 25 mg   thiamine (VITAMIN B1) tablet 100 mg   pantoprazole (PROTONIX) injection 40 mg    Unresulted Labs (From admission, onward)     Start     Ordered   02/03/22 2300  Ethanol  Add-on,   AD        02/03/22 2259             Admission Imaging : CT Hip Right Wo Contrast  Result Date: 02/03/2022 CLINICAL DATA:  Right hip fracture EXAM: CT OF THE RIGHT HIP WITHOUT CONTRAST TECHNIQUE: Multidetector CT imaging of the right hip was performed according to the standard protocol. Multiplanar CT image reconstructions were also generated. RADIATION DOSE REDUCTION: This exam was performed according to the departmental dose-optimization program which includes automated exposure control, adjustment of the mA and/or kV according to patient size and/or use of iterative reconstruction technique. COMPARISON:  None Available. FINDINGS:  Bones/Joint/Cartilage There is acute transcervical fracture of the right femoral neck with fracture fragments in near anatomic alignment. There is, additionally, a comminuted intratrochanteric fracture of the proximal right femur with moderate medial angulation of the major distal fracture fragment consisting of the femoral shaft, avulsion and medial displacement of the lesser trochanter, and mild medial external rotation of the greater trochanter. The femoral head is still seated within the right acetabulum. Superimposed mild to moderate right hip degenerative arthritis with asymmetric joint space narrowing and osteophyte formation. The right hemipelvis is intact. Remote healed fracture of the right ischium noted with no residual deformity. Ligaments Suboptimally assessed by CT. Muscles and Tendons Normal muscle bulk. Gluteal, and hamstring tendons appear intact. Iliopsoas tendon is partially obscured by hemorrhage in the region surrounding the fracture fragments. Soft tissues 3.2 x 4.8 x 5.1 cm hematoma seen adjacent to the fracture fragment subjacent to the vastus intermedius. Hyperdense structure seen posterior to the fracture plane at the level of the lesser trochanter may represent the avulsed muscle belly of the quadratus femoris in addition to small amount of surrounding hemorrhage, best seen on axial image # 98/7 and coronal image # 82/8. Vascular calcifications noted. IMPRESSION: 1. Acute transcervical fracture of the right femoral neck with fracture fragments in near anatomic alignment. 2. Superimposed acute comminuted intratrochanteric fracture of the proximal right femur with moderate medial angulation of the major distal fracture fragment consisting of the femoral shaft, avulsion and medial displacement of the lesser trochanter, and mild medial external rotation of the greater trochanter. 3. Mild to moderate right hip degenerative arthritis. 4. 5.1 cm hematoma adjacent to the fracture fragment  subjacent to the vastus intermedius. 5. Hyperdense structure seen posterior to the fracture plane at the level of the lesser trochanter may represent the avulsed muscle belly of the quadratus femoris in addition to small amount of surrounding hemorrhage. Electronically Signed   By: Fidela Salisbury M.D.   On: 02/03/2022 21:38   CT Shoulder Right Wo Contrast  Result Date: 02/03/2022 CLINICAL DATA:  Fall and trauma to the right shoulder. EXAM: CT OF THE UPPER RIGHT EXTREMITY WITHOUT CONTRAST TECHNIQUE: Multidetector CT imaging of the upper right extremity was performed according to the standard protocol. RADIATION DOSE REDUCTION: This exam was performed according to the departmental dose-optimization program which includes automated exposure control, adjustment of the mA and/or kV according to patient size and/or use of iterative reconstruction technique. COMPARISON:  Right shoulder radiograph dated 02/03/2022. FINDINGS: Bones/Joint/Cartilage There is a comminuted and displaced fracture of the right humeral head and neck. There is medial displacement of the humeral shaft in relation to the humeral head abutting the inferior aspect of the bony glenoid. The bones are osteopenic. There is no dislocation. There is edema within the joint and surrounding soft tissue. Ligaments Suboptimally assessed by CT. Muscles and Tendons No intramuscular hematoma. Soft tissues Diffuse subcutaneous edema. There is a 3 mm right upper lobe nodule. IMPRESSION: 1. Comminuted and displaced fracture of the right humeral head and neck. No dislocation. 2. A 3  mm right upper lobe nodule. No follow-up needed if patient is low-risk.This recommendation follows the consensus statement: Guidelines for Management of Incidental Pulmonary Nodules Detected on CT Images: From the Fleischner Society 2017; Radiology 2017; 284:228-243. Electronically Signed   By: Anner Crete M.D.   On: 02/03/2022 21:28   CT Cervical Spine Wo Contrast  Result Date:  02/03/2022 CLINICAL DATA:  Neck trauma (Age >= 65y).  Fall. EXAM: CT CERVICAL SPINE WITHOUT CONTRAST TECHNIQUE: Multidetector CT imaging of the cervical spine was performed without intravenous contrast. Multiplanar CT image reconstructions were also generated. RADIATION DOSE REDUCTION: This exam was performed according to the departmental dose-optimization program which includes automated exposure control, adjustment of the mA and/or kV according to patient size and/or use of iterative reconstruction technique. COMPARISON:  None Available. FINDINGS: Alignment: Normal Skull base and vertebrae: No acute fracture. No primary bone lesion or focal pathologic process. Soft tissues and spinal canal: No prevertebral fluid or swelling. No visible canal hematoma. Disc levels: Diffuse degenerative disc disease, most pronounced in the lower cervical spine. Bilateral degenerative facet disease. Upper chest: Biapical scarring.  No acute findings Other: None IMPRESSION: No acute bony abnormality. Electronically Signed   By: Rolm Baptise M.D.   On: 02/03/2022 21:13   CT HEAD WO CONTRAST (5MM)  Result Date: 02/03/2022 CLINICAL DATA:  Head trauma, minor (Age >= 65y).  Fall EXAM: CT HEAD WITHOUT CONTRAST TECHNIQUE: Contiguous axial images were obtained from the base of the skull through the vertex without intravenous contrast. RADIATION DOSE REDUCTION: This exam was performed according to the departmental dose-optimization program which includes automated exposure control, adjustment of the mA and/or kV according to patient size and/or use of iterative reconstruction technique. COMPARISON:  01/27/2017 FINDINGS: Brain: No acute intracranial abnormality. Specifically, no hemorrhage, hydrocephalus, mass lesion, acute infarction, or significant intracranial injury. Vascular: No hyperdense vessel or unexpected calcification. Skull: No acute calvarial abnormality. Sinuses/Orbits: No acute findings Other: None IMPRESSION: No acute  intracranial abnormality. Electronically Signed   By: Rolm Baptise M.D.   On: 02/03/2022 21:10   DG Chest 1 View  Result Date: 02/03/2022 CLINICAL DATA:  Fall, chest trauma EXAM: CHEST  1 VIEW COMPARISON:  None Available. FINDINGS: Lungs are clear. No pneumothorax or pleural effusion. Cardiac size is within normal limits. Pulmonary vascularity is normal. Surgical clips seen within the left breast. Acute right humeral head fracture is partially visualized. IMPRESSION: 1. No active disease. 2. Acute right humeral head fracture. Electronically Signed   By: Fidela Salisbury M.D.   On: 02/03/2022 20:12   DG Shoulder Right  Result Date: 02/03/2022 CLINICAL DATA:  Fall, right shoulder pain EXAM: RIGHT SHOULDER - 2+ VIEW COMPARISON:  None Available. FINDINGS: There is a minimum three-part fracture of the right humeral head. Dominant, mildly comminuted fracture plane extends through the surgical neck of the humerus with 1/2 shaft with medial displacement of the distal fracture fragment. Nondisplaced fracture plane is seen involving the greater tuberosity. Humeral head is still seated within the glenoid fossa. Mild superimposed glenohumeral degenerative arthritis. Mild acromioclavicular degenerative arthritis. Limited evaluation of the right hemithorax is unremarkable. IMPRESSION: 1. Three-part fracture of the right humeral head as described above. Electronically Signed   By: Fidela Salisbury M.D.   On: 02/03/2022 20:11   DG Hip Unilat W or Wo Pelvis 2-3 Views Right  Result Date: 02/03/2022 CLINICAL DATA:  Right hip pain. EXAM: DG HIP (WITH OR WITHOUT PELVIS) 2-3V RIGHT COMPARISON:  None Available. FINDINGS: There is a comminuted, displaced,  and angulated fracture of the right femur involving the intertrochanteric ridge and subtrochanteric region and extension into the proximal femoral diaphysis. Displaced fracture of the lesser trochanter. There is varus angulation. The bones are osteopenic. There is no  dislocation. There is degenerative changes of the hips. There is soft tissue edema. IMPRESSION: Comminuted, displaced, and angulated fracture of the right femur. Electronically Signed   By: Anner Crete M.D.   On: 02/03/2022 20:10      Physical Examination: Vitals:   02/03/22 2130 02/03/22 2200 02/03/22 2230 02/03/22 2247  BP: (!) 142/77 129/69 132/66   Pulse: 83 83 94   Temp:    98.4 F (36.9 C)  Resp: '17 17 17   '$ Height:      Weight:      SpO2: 100% 100% 95%   TempSrc:    Oral  BMI (Calculated):       Physical Exam Vitals and nursing note reviewed.  Constitutional:      General: She is not in acute distress.    Appearance: Normal appearance. She is not ill-appearing, toxic-appearing or diaphoretic.  HENT:     Head: Normocephalic and atraumatic.     Right Ear: Hearing and external ear normal.     Left Ear: Hearing and external ear normal.     Nose: Nose normal. No nasal deformity.     Mouth/Throat:     Lips: Pink.     Mouth: Mucous membranes are moist.     Tongue: No lesions.     Pharynx: Oropharynx is clear.  Eyes:     Extraocular Movements: Extraocular movements intact.     Pupils: Pupils are equal, round, and reactive to light.  Neck:     Vascular: No carotid bruit.  Cardiovascular:     Rate and Rhythm: Normal rate and regular rhythm.     Pulses: Normal pulses.     Heart sounds: Normal heart sounds.  Pulmonary:     Effort: Pulmonary effort is normal.     Breath sounds: Normal breath sounds.  Abdominal:     General: Bowel sounds are normal. There is no distension.     Palpations: Abdomen is soft. There is no mass.     Tenderness: There is no abdominal tenderness. There is no guarding.     Hernia: No hernia is present.  Musculoskeletal:        General: Deformity present.     Right hip: Deformity and tenderness present.     Right lower leg: No edema.     Left lower leg: No edema.  Skin:    General: Skin is warm.     Findings: Bruising present.   Neurological:     General: No focal deficit present.     Mental Status: She is alert and oriented to person, place, and time.     Cranial Nerves: Cranial nerves 2-12 are intact.     Motor: Motor function is intact.  Psychiatric:        Attention and Perception: Attention normal.        Mood and Affect: Mood normal.        Speech: Speech normal.        Behavior: Behavior normal. Behavior is cooperative.        Cognition and Memory: Cognition normal.       Assessment and Plan: * Fall at home, initial encounter Patient has history of fall is at risk for multiple falls with her age deconditioning and osteopenia osteoarthritis osteoporosis, there  is alcohol abuse listed in past medical history which I will obtain level. Advised CIWA precautions. Fall precautions. Physical therapy short-term rehab as deemed appropriate per PT. Orthostatic vitals once stable.    Fracture of right hip ([place]) Per patient's description sounds mechanical fall as she tripped over the leaf blower. Orthopedic assessment consulted Dr. Leim Fabry is to manage.   Case management consult for discharge dispo and options.  Shoulder fracture, right, closed, initial encounter Patient also found to have a three-part fracture of the right humerus. Currently in a sling, as needed pain control, orthopedics consult.    Anemia, macrocytic, nutritional Mild anemia. We will type and screen and follow.   Thrombocytopenia ([place])    Latest Ref Rng & Units 02/03/2022    7:01 PM 02/15/2021    8:23 AM 02/15/2020    3:28 PM  CBC  WBC 4.0 - 10.5 K/uL 10.1  6.0  6.0   Hemoglobin 12.0 - 15.0 g/dL 11.5  12.5  12.2   Hematocrit 36.0 - 46.0 % 36.4  37.8  39.0   Platelets 150 - 400 K/uL 240  219  280   Resolved. Antiplatelet therapy currently held secondary to anticipated IR procedure.   Essential hypertension Vitals:   02/03/22 1835 02/03/22 1915 02/03/22 2035 02/03/22 2100  BP: (!) 161/86 (!) 154/108 (!) 142/77 (!)  154/81   02/03/22 2130 02/03/22 2200 02/03/22 2230  BP: (!) 142/77 129/69 132/66  We will continue patient on her Aldactone. Monitor blood pressure.   Alcohol abuse No report of any alcohol or tobacco. CIWA precautions and thiamine continued.   DVT prophylaxis:  SCDs  Code Status:  Full code  Family Communication:  Morrissa, Shein (Spouse)  [phone]   Disposition Plan:  To be determined  Consults called:  Orthopedics: Dr. Posey Pronto  Admission status: Inpatient  Unit/ Expected LOS: Med telemetry/2 to 3 days.   Para Skeans MD [facility]  6 PM- 2 AM. Please contact me via secure Chat 6 PM-2 AM. [phone] ( Pager ) To contact the [facility] Attending or Consulting provider [place] or covering provider during after hours [place], for this patient.   Check the care team in [facility] and look for a) attending/consulting [facility] provider listed and b) the [facility] team listed Log into [email or web address] and use 's universal password to access. If you do not have the password, please contact the hospital operator. Locate the [facility] provider you are looking for under [facility] and page to a number that you can be directly reached. If you still have difficulty reaching the provider, please page the [facility] (Director on Call) for the Hospitalists listed on amion for assistance. [email or web address] 02/03/2022, 11:17 PM

## 2022-02-03 NOTE — Assessment & Plan Note (Signed)
Patient has history of fall is at risk for multiple falls with her age deconditioning and osteopenia osteoarthritis osteoporosis, there is alcohol abuse listed in past medical history which I will obtain level. Advised CIWA precautions. Fall precautions. Physical therapy short-term rehab as deemed appropriate per PT. Orthostatic vitals once stable.

## 2022-02-04 DIAGNOSIS — I10 Essential (primary) hypertension: Secondary | ICD-10-CM

## 2022-02-04 DIAGNOSIS — W19XXXA Unspecified fall, initial encounter: Secondary | ICD-10-CM

## 2022-02-04 DIAGNOSIS — Y92009 Unspecified place in unspecified non-institutional (private) residence as the place of occurrence of the external cause: Secondary | ICD-10-CM

## 2022-02-04 SURGERY — FIXATION, FRACTURE, INTERTROCHANTERIC, WITH INTRAMEDULLARY ROD
Anesthesia: Choice | Laterality: Right

## 2022-02-04 MED ORDER — SODIUM CHLORIDE 0.9 % IV SOLN: INTRAVENOUS | Status: DC

## 2022-02-04 MED ORDER — MELATONIN 5 MG PO TABS: 2.5000 mg | ORAL_TABLET | Freq: Every evening | ORAL | Status: DC | PRN

## 2022-02-04 NOTE — ED Notes (Signed)
Called [facility] transfer Gerrianne Scale) ref to status of Ortho page. Ortho repaged at 00:41

## 2022-02-04 NOTE — ED Notes (Signed)
[facility] ortho returned call to K. Jayvyn Haselton DO. [facility] is paging out medicine

## 2022-02-04 NOTE — ED Notes (Signed)
Pt accepted to [facility]. Waiting room assignment

## 2022-02-04 NOTE — ED Notes (Signed)
Call [facility] transport [facility]) no units available until tomorrow am

## 2022-02-04 NOTE — ED Notes (Signed)
Pt placed in hospital bed

## 2022-02-04 NOTE — ED Notes (Signed)
[facility] medicine returned call to Ladislaus Repsher DO

## 2022-02-04 NOTE — ED Notes (Signed)
Spouse notified via telephone that pt is being transferred to [facility] room 4307. EMS here and transferred pt onto their stretcher.  Pt transferred to [facility] at this time.

## 2022-02-04 NOTE — ED Notes (Signed)
Room assigned from [facility]) rm [id number] [facility] accepting

## 2022-02-04 NOTE — ED Notes (Signed)
Called to [facility]/still no bed assignment/rep:nicole.

## 2022-02-04 NOTE — ED Provider Notes (Signed)
12:05 AM  Assumed care of patient at shift change.  81 year old female with mechanical fall at home who has a right transcervical femoral neck fracture and a comminuted intertrochanteric fracture.  Dr. Posey Pronto with orthopedics has reviewed CT imaging and discussed with orthopedics at [facility].  They feel this is outside of the scope of what can be managed without our facility.  Dr. Quentin Cornwall has contacted [facility] who is unable to accept patient as they are on diversion.  Call has been placed to [facility].  Patient also has comminuted and displaced fracture of the right humeral head and neck without dislocation.  1:10 AM  Spoke with Dr. Marney Setting with orthopedic surgery who states he would be able to manage this fracture at their facility.  Request internal medicine admission due to patient age.  She does have history of osteoporosis and take spironolactone every other day due to lower extremity swelling.  2:22 AM Spoke with Dr. Susa Griffins with hospitalist service who agrees on admission.  Appreciate medicine and orthopedic assistance with this patient.  Patient will be transferred to [facility] and [facility] once bed available.  Patient has been updated.     Lynasia Meloche, Delice Bison, DO 02/04/22 0222

## 2022-02-04 NOTE — ED Notes (Signed)
*  update*Called to [facility]/ still no bed assignment/rep:laura

## 2022-02-04 NOTE — ED Notes (Signed)
Spoke with [facility] to give them an update on the patients status during this shift. Patients vitals were given. [facility] states that there are still no available beds, and they will update [place] when a room becomes available.

## 2022-02-04 NOTE — ED Notes (Signed)
Patient is in a sling. Currently rates pain 8/10 in hip.

## 2022-02-04 NOTE — ED Notes (Signed)
Called [facility] transfer concerning page for medicine. Mariann Laster transfer co-ord. Will return call , busy on another line

## 2022-02-04 NOTE — ED Notes (Signed)
Called  [facility] Mariann Laster) per Quentin Cornwall, MD ref transfer

## 2022-02-04 NOTE — ED Notes (Incomplete)
Pt taken via [facility] to [facility] room [phone] for admission.

## 2022-02-04 NOTE — ED Notes (Signed)
Called [facility] per Charna Archer, MD for transport to [facility]

## 2022-02-25 ENCOUNTER — Encounter: Payer: Medicare Other | Admitting: Internal Medicine

## 2022-03-05 ENCOUNTER — Ambulatory Visit (INDEPENDENT_AMBULATORY_CARE_PROVIDER_SITE_OTHER): Payer: Medicare Other | Admitting: Internal Medicine

## 2022-03-05 ENCOUNTER — Encounter: Payer: Self-pay | Admitting: Internal Medicine

## 2022-03-05 VITALS — BP 138/74 | HR 79 | Temp 97.3°F

## 2022-03-05 DIAGNOSIS — M12811 Other specific arthropathies, not elsewhere classified, right shoulder: Secondary | ICD-10-CM | POA: Diagnosis not present

## 2022-03-05 DIAGNOSIS — S72001D Fracture of unspecified part of neck of right femur, subsequent encounter for closed fracture with routine healing: Secondary | ICD-10-CM

## 2022-03-05 DIAGNOSIS — R899 Unspecified abnormal finding in specimens from other organs, systems and tissues: Secondary | ICD-10-CM

## 2022-03-05 DIAGNOSIS — S42201A Unspecified fracture of upper end of right humerus, initial encounter for closed fracture: Secondary | ICD-10-CM | POA: Insufficient documentation

## 2022-03-05 DIAGNOSIS — S72141A Displaced intertrochanteric fracture of right femur, initial encounter for closed fracture: Secondary | ICD-10-CM | POA: Insufficient documentation

## 2022-03-05 DIAGNOSIS — M810 Age-related osteoporosis without current pathological fracture: Secondary | ICD-10-CM | POA: Diagnosis not present

## 2022-03-05 DIAGNOSIS — I10 Essential (primary) hypertension: Secondary | ICD-10-CM

## 2022-03-05 NOTE — Assessment & Plan Note (Signed)
Continue physical therapy stop aspirin, continue Lovenox

## 2022-03-05 NOTE — Assessment & Plan Note (Signed)
Advised to take vitamin D 2000 unit every day

## 2022-03-05 NOTE — Progress Notes (Signed)
Established Patient Office Visit  Subjective:  Patient ID: Sonya Park, female    DOB: [birth year]  Age: 81 y.o. MRN: [id number]  CC:  Chief Complaint  Patient presents with   Hospitalization Follow-up    Fall, still in pain.     HPI  [name] presents for check up  Past Medical History:  Diagnosis Date   Arthritis    Back pain    Insomnia    Medical history non-contributory    Osteoporosis     Past Surgical History:  Procedure Laterality Date   ABDOMINAL HYSTERECTOMY     BREAST LUMPECTOMY Right    COLONOSCOPY WITH PROPOFOL N/A 07/18/2015   Procedure: COLONOSCOPY WITH PROPOFOL;  Surgeon: [name], MD;  Location: [facility] ENDOSCOPY;  Service: Endoscopy;  Laterality: N/A;   COSMETIC SURGERY     GASTRIC BYPASS     X2   HEMORROIDECTOMY     KNEE ARTHROPLASTY Right 12/13/2015   Procedure: COMPUTER ASSISTED TOTAL KNEE ARTHROPLASTY;  Surgeon: Dereck Leep, MD;  Location: [facility] ORS;  Service: Orthopedics;  Laterality: Right;   KNEE ARTHROSCOPY     REPLACEMENT TOTAL KNEE Left     Family History  Problem Relation Age of Onset   Liver disease Mother    Heart attack Father    Cancer Father    CAD Sister     Social History   Socioeconomic History   Marital status: Married    Spouse name: Zaleigh Bermingham   Number of children: 1   Years of education: 12   Highest education level: Some college, no degree  Occupational History   Occupation: Retired  Tobacco Use   Smoking status: Former    Packs/day: 2.00    Years: 29.00    Total pack years: 58.00    Types: Cigarettes    Quit date: 11/30/1994    Years since quitting: 27.2   Smokeless tobacco: Never  Vaping Use   Vaping Use: Never used  Substance and Sexual Activity   Alcohol use: Not Currently    Alcohol/week: 21.0 - 35.0 standard drinks of alcohol    Types: 21 - 35 Glasses of wine per week    Comment: 3-5 glasses of wine/ day   Drug use: No   Sexual activity: Not Currently  Other Topics Concern   Not on  file  Social History Narrative   Not on file   Social Determinants of Health   Financial Resource Strain: Low Risk  (02/16/2021)   Overall Financial Resource Strain (CARDIA)    Difficulty of Paying Living Expenses: Not hard at all  Food Insecurity: No Food Insecurity (02/16/2021)   Hunger Vital Sign    Worried About Running Out of Food in the Last Year: Never true    [place] in the Last Year: Never true  Transportation Needs: No Transportation Needs (02/16/2021)   PRAPARE - Hydrologist (Medical): No    Lack of Transportation (Non-Medical): No  Physical Activity: Sufficiently Active (02/16/2021)   Exercise Vital Sign    Days of Exercise per Week: 3 days    Minutes of Exercise per Session: 60 min  Stress: No Stress Concern Present (02/16/2021)   [place]    Feeling of Stress : Not at all  Social Connections: [place] (02/16/2021)   Social Connection and Isolation Panel [NHANES]    Frequency of Communication with Friends and Family: More than  three times a week    Frequency of Social Gatherings with Friends and Family: Once a week    Attends Religious Services: More than 4 times per year    Active Member of [employer] or Organizations: Yes    Attends Music therapist: More than 4 times per year    Marital Status: Married  Human resources officer Violence: Not At Risk (02/16/2021)   Humiliation, Afraid, Rape, and Kick questionnaire    Fear of Current or Ex-Partner: No    Emotionally Abused: No    Physically Abused: No    Sexually Abused: No     Current Outpatient Medications:    aspirin 81 MG tablet, Take 81 mg by mouth daily. Once a week (every Sunday), Disp: , Rfl:    b complex vitamins capsule, Take 1 capsule by mouth daily., Disp: , Rfl:    BIOTIN PO, Take 1 capsule by mouth daily., Disp: , Rfl:    calcium citrate-vitamin D (CITRACAL+D) 315-200 MG-UNIT  tablet, Take by mouth., Disp: , Rfl:    Cholecalciferol 125 MCG (5000 UT) TABS, Take by mouth., Disp: , Rfl:    Copper Gluconate 2 MG CAPS, Take 4 mg by mouth daily., Disp: 60 capsule, Rfl: 3   FEROSUL 325 (65 Fe) MG tablet, TAKE 1 TABLET DAILY, Disp: 90 tablet, Rfl: 3   ibandronate (BONIVA) 150 MG tablet, TAKE 1 TABLET EVERY 30 DAYS, Disp: 3 tablet, Rfl: 3   Melatonin 5 MG TABS, Take 2.5 mg by mouth. As needed, Disp: , Rfl:    metaxalone (SKELAXIN) 800 MG tablet, Take 800 mg by mouth 3 (three) times daily as needed., Disp: , Rfl:    Multiple Vitamins-Minerals (BARIATRIC MULTIVITAMINS/IRON PO), Take 2 capsules by mouth every morning. After breakfast., Disp: , Rfl:    multivitamin-lutein (OCUVITE-LUTEIN) CAPS capsule, Take 1 capsule daily by mouth. (Patient taking differently: Take 2 capsules by mouth daily.), Disp: , Rfl: 0   nystatin-triamcinolone ointment (MYCOLOG), Apply 1 application. topically 2 (two) times daily. (Patient taking differently: Apply 1 application  topically 2 (two) times daily as needed.), Disp: 30 g, Rfl: 0   pantoprazole (PROTONIX) 40 MG tablet, Take 1 tablet (40 mg total) by mouth 2 (two) times daily before a meal., Disp: 180 tablet, Rfl: 1   PREVIDENT 5000 SENSITIVE 1.1-5 % GEL, Place 1 Application onto teeth as directed., Disp: , Rfl:    spironolactone (ALDACTONE) 25 MG tablet, Take 1 tablet (25 mg total) by mouth daily. (Patient taking differently: Take 25 mg by mouth every other day.), Disp: 90 tablet, Rfl: 3   thiamine 100 MG tablet, Take 1 tablet (100 mg total) daily by mouth., Disp: , Rfl:    vitamin B-12 500 MCG tablet, Take 1 tablet (500 mcg total) daily by mouth., Disp: , Rfl:    Allergies  Allergen Reactions   Chlorpheniramine    Penicillins Swelling    Has patient had a PCN reaction causing immediate rash, facial/tongue/throat swelling, SOB or lightheadedness with hypotension: Yes Has patient had a PCN reaction causing severe rash involving mucus membranes or  skin necrosis: No Has patient had a PCN reaction that required hospitalization: No Has patient had a PCN reaction occurring within the last 10 years: No If all of the above answers are "NO", then may proceed with Cephalosporin use. Other reaction(s): SWELLING Other reaction(s): other   Other Itching and Other (See Comments)    "mycin" doesn't know which one "mycin" doesn't know which one    ROS Review  of Systems  Constitutional: Negative.   HENT: Negative.    Eyes: Negative.   Respiratory: Negative.    Cardiovascular: Negative.   Gastrointestinal: Negative.   Endocrine: Negative.   Genitourinary: Negative.   Musculoskeletal: Negative.   Skin: Negative.   Allergic/Immunologic: Negative.   Neurological: Negative.   Hematological: Negative.   Psychiatric/Behavioral: Negative.    All other systems reviewed and are negative.     Objective:    Physical Exam Musculoskeletal:       Arms:       Legs:     Comments: Rt shoulder  and rt hip fracture     BP 138/74   Pulse 79   Temp (!) 97.3 F (36.3 C) (Temporal)   SpO2 97%  Wt Readings from Last 3 Encounters:  02/03/22 152 lb (68.9 kg)  10/19/21 152 lb 4.8 oz (69.1 kg)  07/27/21 155 lb (70.3 kg)     Health Maintenance Due  Topic Date Due   COVID-19 Vaccine (5 - 2023-24 season) 12/07/2021    There are no preventive care reminders to display for this patient.  Lab Results  Component Value Date   TSH 2.465 02/15/2021   Lab Results  Component Value Date   WBC 10.1 02/03/2022   HGB 11.5 (L) 02/03/2022   HCT 36.4 02/03/2022   MCV 92.6 02/03/2022   PLT 240 02/03/2022   Lab Results  Component Value Date   NA 138 02/03/2022   K 3.9 02/03/2022   CO2 24 02/03/2022   GLUCOSE 115 (H) 02/03/2022   BUN 23 02/03/2022   CREATININE 0.75 02/03/2022   BILITOT 1.0 02/15/2021   ALKPHOS 53 02/15/2021   AST 24 02/15/2021   ALT 18 02/15/2021   PROT 6.5 02/15/2021   ALBUMIN 3.8 02/15/2021   CALCIUM 7.9 (L) 02/03/2022    ANIONGAP 8 02/03/2022   Lab Results  Component Value Date   CHOL 208 (H) 02/15/2021   Lab Results  Component Value Date   HDL 78 02/15/2021   Lab Results  Component Value Date   LDLCALC 105 (H) 02/15/2021   Lab Results  Component Value Date   TRIG 124 02/15/2021   Lab Results  Component Value Date   CHOLHDL 2.7 02/15/2021   Lab Results  Component Value Date   HGBA1C 4.8 04/11/2021      Assessment & Plan:   Problem List Items Addressed This Visit       Cardiovascular and Mediastinum   Essential hypertension - Primary   Relevant Orders   COMPLETE METABOLIC PANEL WITH GFR   CBC with Differential/Platelet     Musculoskeletal and Integument   Osteoporosis    Advised to take vitamin D 2000 unit every day      Rotator cuff arthropathy of right shoulder    Patient is affecting her right shoulder      Fracture of right hip ([place])    Continue physical therapy stop aspirin, continue Lovenox      Other Visit Diagnoses     Abnormal laboratory test       Relevant Orders   COMPLETE METABOLIC PANEL WITH GFR   CBC with Differential/Platelet       No orders of the defined types were placed in this encounter.   Follow-up: No follow-ups on file.    Cletis Athens, MD

## 2022-03-05 NOTE — Assessment & Plan Note (Signed)
Patient is affecting her right shoulder

## 2022-03-07 ENCOUNTER — Other Ambulatory Visit: Payer: Self-pay | Admitting: Nurse Practitioner

## 2022-03-07 MED ORDER — OXYCODONE HCL 5 MG PO TABS: 5.0000 mg | ORAL_TABLET | ORAL | 0 refills | Status: DC | PRN

## 2022-03-07 NOTE — Progress Notes (Unsigned)
Patient broke her hip

## 2022-03-12 ENCOUNTER — Other Ambulatory Visit: Payer: Self-pay

## 2022-03-12 MED ORDER — GABAPENTIN 100 MG PO CAPS: 100.0000 mg | ORAL_CAPSULE | Freq: Three times a day (TID) | ORAL | 0 refills | Status: DC

## 2022-03-14 ENCOUNTER — Ambulatory Visit (INDEPENDENT_AMBULATORY_CARE_PROVIDER_SITE_OTHER): Payer: Medicare Other

## 2022-03-14 ENCOUNTER — Other Ambulatory Visit: Payer: Self-pay | Admitting: Nurse Practitioner

## 2022-03-14 DIAGNOSIS — Z0000 Encounter for general adult medical examination without abnormal findings: Secondary | ICD-10-CM | POA: Diagnosis not present

## 2022-03-14 MED ORDER — OXYCODONE-ACETAMINOPHEN 5-325 MG PO TABS: 1.0000 | ORAL_TABLET | ORAL | 0 refills | Status: AC | PRN

## 2022-03-14 NOTE — Addendum Note (Signed)
Addended by: Angus Seller on: 03/14/2022 04:33 PM   Modules accepted: Orders

## 2022-03-14 NOTE — Patient Instructions (Signed)
Sonya Park , Thank you for taking time to come for your Medicare Wellness Visit. I appreciate your ongoing commitment to your health goals. Please review the following plan we discussed and let me know if I can assist you in the future.   Screening recommendations/referrals: Colonoscopy: aged out Mammogram: aged out Bone Density: 06/13/21 Recommended yearly ophthalmology/optometry visit for glaucoma screening and checkup Recommended yearly dental visit for hygiene and checkup  Vaccinations: Influenza vaccine: 01/11/22 Pneumococcal vaccine: 01/02/18 Tdap vaccine: 11/12/20 Shingles vaccine: Shingrix 01/06/18, 05/04/18   Covid-19:04/18/19, 05/09/19, 02/14/20, 10/22/20  Advanced directives: yes  Conditions/risks identified: none  Next appointment: Follow up in one year for your annual wellness visit    Preventive Care 77 Years and Older, Female Preventive care refers to lifestyle choices and visits with your health care provider that can promote health and wellness. What does preventive care include? A yearly physical exam. This is also called an annual well check. Dental exams once or twice a year. Routine eye exams. Ask your health care provider how often you should have your eyes checked. Personal lifestyle choices, including: Daily care of your teeth and gums. Regular physical activity. Eating a healthy diet. Avoiding tobacco and drug use. Limiting alcohol use. Practicing safe sex. Taking low-dose aspirin every day. Taking vitamin and mineral supplements as recommended by your health care provider. What happens during an annual well check? The services and screenings done by your health care provider during your annual well check will depend on your age, overall health, lifestyle risk factors, and family history of disease. Counseling  Your health care provider may ask you questions about your: Alcohol use. Tobacco use. Drug use. Emotional well-being. Home and relationship  well-being. Sexual activity. Eating habits. History of falls. Memory and ability to understand (cognition). Work and work Statistician. Reproductive health. Screening  You may have the following tests or measurements: Height, weight, and BMI. Blood pressure. Lipid and cholesterol levels. These may be checked every 5 years, or more frequently if you are over 65 years old. Skin check. Lung cancer screening. You may have this screening every year starting at age 16 if you have a 30-pack-year history of smoking and currently smoke or have quit within the past 15 years. Fecal occult blood test (FOBT) of the stool. You may have this test every year starting at age 41. Flexible sigmoidoscopy or colonoscopy. You may have a sigmoidoscopy every 5 years or a colonoscopy every 10 years starting at age 52. Hepatitis C blood test. Hepatitis B blood test. Sexually transmitted disease (STD) testing. Diabetes screening. This is done by checking your blood sugar (glucose) after you have not eaten for a while (fasting). You may have this done every 1-3 years. Bone density scan. This is done to screen for osteoporosis. You may have this done starting at age 21. Mammogram. This may be done every 1-2 years. Talk to your health care provider about how often you should have regular mammograms. Talk with your health care provider about your test results, treatment options, and if necessary, the need for more tests. Vaccines  Your health care provider may recommend certain vaccines, such as: Influenza vaccine. This is recommended every year. Tetanus, diphtheria, and acellular pertussis (Tdap, Td) vaccine. You may need a Td booster every 10 years. Zoster vaccine. You may need this after age 87. Pneumococcal 13-valent conjugate (PCV13) vaccine. One dose is recommended after age 12. Pneumococcal polysaccharide (PPSV23) vaccine. One dose is recommended after age 62. Talk to your health care  provider about which  screenings and vaccines you need and how often you need them. This information is not intended to replace advice given to you by your health care provider. Make sure you discuss any questions you have with your health care provider. Document Released: 04/21/2015 Document Revised: 12/13/2015 Document Reviewed: 01/24/2015 Elsevier Interactive Patient Education  2017 [place] Prevention in the Home Falls can cause injuries. They can happen to people of all ages. There are many things you can do to make your home safe and to help prevent falls. What can I do on the outside of my home? Regularly fix the edges of walkways and driveways and fix any cracks. Remove anything that might make you trip as you walk through a door, such as a raised step or threshold. Trim any bushes or trees on the path to your home. Use bright outdoor lighting. Clear any walking paths of anything that might make someone trip, such as rocks or tools. Regularly check to see if handrails are loose or broken. Make sure that both sides of any steps have handrails. Any raised decks and porches should have guardrails on the edges. Have any leaves, snow, or ice cleared regularly. Use sand or salt on walking paths during winter. Clean up any spills in your garage right away. This includes oil or grease spills. What can I do in the bathroom? Use night lights. Install grab bars by the toilet and in the tub and shower. Do not use towel bars as grab bars. Use non-skid mats or decals in the tub or shower. If you need to sit down in the shower, use a plastic, non-slip stool. Keep the floor dry. Clean up any water that spills on the floor as soon as it happens. Remove soap buildup in the tub or shower regularly. Attach bath mats securely with double-sided non-slip rug tape. Do not have throw rugs and other things on the floor that can make you trip. What can I do in the bedroom? Use night lights. Make sure that you have a  light by your bed that is easy to reach. Do not use any sheets or blankets that are too big for your bed. They should not hang down onto the floor. Have a firm chair that has side arms. You can use this for support while you get dressed. Do not have throw rugs and other things on the floor that can make you trip. What can I do in the kitchen? Clean up any spills right away. Avoid walking on wet floors. Keep items that you use a lot in easy-to-reach places. If you need to reach something above you, use a strong step stool that has a grab bar. Keep electrical cords out of the way. Do not use floor polish or wax that makes floors slippery. If you must use wax, use non-skid floor wax. Do not have throw rugs and other things on the floor that can make you trip. What can I do with my stairs? Do not leave any items on the stairs. Make sure that there are handrails on both sides of the stairs and use them. Fix handrails that are broken or loose. Make sure that handrails are as long as the stairways. Check any carpeting to make sure that it is firmly attached to the stairs. Fix any carpet that is loose or worn. Avoid having throw rugs at the top or bottom of the stairs. If you do have throw rugs, attach them to the  floor with carpet tape. Make sure that you have a light switch at the top of the stairs and the bottom of the stairs. If you do not have them, ask someone to add them for you. What else can I do to help prevent falls? Wear shoes that: Do not have high heels. Have rubber bottoms. Are comfortable and fit you well. Are closed at the toe. Do not wear sandals. If you use a stepladder: Make sure that it is fully opened. Do not climb a closed stepladder. Make sure that both sides of the stepladder are locked into place. Ask someone to hold it for you, if possible. Clearly mark and make sure that you can see: Any grab bars or handrails. First and last steps. Where the edge of each step  is. Use tools that help you move around (mobility aids) if they are needed. These include: Canes. Walkers. Scooters. Crutches. Turn on the lights when you go into a dark area. Replace any light bulbs as soon as they burn out. Set up your furniture so you have a clear path. Avoid moving your furniture around. If any of your floors are uneven, fix them. If there are any pets around you, be aware of where they are. Review your medicines with your doctor. Some medicines can make you feel dizzy. This can increase your chance of falling. Ask your doctor what other things that you can do to help prevent falls. This information is not intended to replace advice given to you by your health care provider. Make sure you discuss any questions you have with your health care provider. Document Released: 01/19/2009 Document Revised: 08/31/2015 Document Reviewed: 04/29/2014 Elsevier Interactive Patient Education  2017 [employer].

## 2022-03-14 NOTE — Progress Notes (Signed)
Virtual Visit via Telephone Note  I connected with  Sonya Park on 03/14/22 at  9:30 AM EST by telephone and verified that I am speaking with the correct person using two identifiers.  Location: Patient: home Provider: [facility] Persons participating in the virtual visit: patient/Nurse Health Advisor   I discussed the limitations, risks, security and privacy concerns of performing an evaluation and management service by telephone and the availability of in person appointments. The patient expressed understanding and agreed to proceed.  Interactive audio and video telecommunications were attempted between this nurse and patient, however failed, due to patient having technical difficulties OR patient did not have access to video capability.  We continued and completed visit with audio only.  Some vital signs may be absent or patient reported.   Dionisio David, LPN  Subjective:   Sonya Park is a 81 y.o. female who presents for Medicare Annual (Subsequent) preventive examination.  Review of Systems     Cardiac Risk Factors include: advanced age (>47mn, >>68women)     Objective:    Today's Vitals   03/14/22 0918  PainSc: 7    There is no height or weight on file to calculate BMI.     03/14/2022    9:29 AM 02/03/2022    6:42 PM 02/16/2021    9:47 AM 02/26/2017    1:44 PM 01/29/2017    9:00 AM 01/14/2017    3:02 PM 12/13/2015   12:30 PM  Advanced Directives  Does Patient Have a Medical Advance Directive? Yes Yes Yes Yes No No Yes  Type of [place] will Living will [place] will [place] will   Living will  Does patient want to make changes to medical advance directive? No - Patient declined  No - Patient declined No - Patient declined   No - Patient declined  Copy of HCopanin Chart? Yes - validated most recent copy scanned in chart (See row information)   Yes - validated most recent copy scanned in chart (See row information) No - copy requested   No - copy requested  Would patient like information on creating a medical advance directive?  No - Patient declined   No - Patient declined      Current Medications (verified) Outpatient Encounter Medications as of 03/14/2022  Medication Sig   aspirin 81 MG tablet Take 81 mg by mouth daily. Once a week (every Sunday)   b complex vitamins capsule Take 1 capsule by mouth daily.   BIOTIN PO Take 1 capsule by mouth daily.   calcium citrate-vitamin D (CITRACAL+D) 315-200 MG-UNIT tablet Take by mouth.   Cholecalciferol 125 MCG (5000 UT) TABS Take by mouth.   Copper Gluconate 2 MG CAPS Take 4 mg by mouth daily.   FEROSUL 325 (65 Fe) MG tablet TAKE 1 TABLET DAILY   gabapentin (NEURONTIN) 100 MG capsule Take 1 capsule (100 mg total) by mouth 3 (three) times daily.   ibandronate (BONIVA) 150 MG tablet TAKE 1 TABLET EVERY 30 DAYS   Melatonin 5 MG TABS Take 2.5 mg by mouth. As needed   Multiple Vitamins-Minerals (BARIATRIC MULTIVITAMINS/IRON PO) Take 2 capsules by mouth every morning. After breakfast.   multivitamin-lutein (OCUVITE-LUTEIN) CAPS capsule Take 1 capsule daily by mouth. (Patient taking differently: Take 2 capsules by mouth daily.)   pantoprazole (PROTONIX) 40 MG tablet Take 1 tablet (40 mg total) by mouth 2 (two) times daily before  a meal.   PREVIDENT 5000 SENSITIVE 1.1-5 % GEL Place 1 Application onto teeth as directed.   spironolactone (ALDACTONE) 25 MG tablet Take 1 tablet (25 mg total) by mouth daily. (Patient taking differently: Take 25 mg by mouth every other day.)   thiamine 100 MG tablet Take 1 tablet (100 mg total) daily by mouth.   vitamin B-12 500 MCG tablet Take 1 tablet (500 mcg total) daily by mouth.   metaxalone (SKELAXIN) 800 MG tablet Take 800 mg by mouth 3 (three) times daily as needed. (Patient not taking: Reported on 03/14/2022)   nystatin-triamcinolone ointment (MYCOLOG) Apply 1  application. topically 2 (two) times daily. (Patient not taking: Reported on 03/14/2022)   No facility-administered encounter medications on file as of 03/14/2022.    Allergies (verified) Chlorpheniramine, Penicillins, and Other   History: Past Medical History:  Diagnosis Date   Arthritis    Back pain    Insomnia    Medical history non-contributory    Osteoporosis    Past Surgical History:  Procedure Laterality Date   ABDOMINAL HYSTERECTOMY     BREAST LUMPECTOMY Right    COLONOSCOPY WITH PROPOFOL N/A 07/18/2015   Procedure: COLONOSCOPY WITH PROPOFOL;  Surgeon: [name], MD;  Location: [facility] ENDOSCOPY;  Service: Endoscopy;  Laterality: N/A;   COSMETIC SURGERY     GASTRIC BYPASS     X2   HEMORROIDECTOMY     KNEE ARTHROPLASTY Right 12/13/2015   Procedure: COMPUTER ASSISTED TOTAL KNEE ARTHROPLASTY;  Surgeon: Dereck Leep, MD;  Location: [facility] ORS;  Service: Orthopedics;  Laterality: Right;   KNEE ARTHROSCOPY     REPLACEMENT TOTAL KNEE Left    Family History  Problem Relation Age of Onset   Liver disease Mother    Heart attack Father    Cancer Father    CAD Sister    Social History   Socioeconomic History   Marital status: Married    Spouse name: Avanti Jetter   Number of children: 1   Years of education: 12   Highest education level: Some college, no degree  Occupational History   Occupation: Retired  Tobacco Use   Smoking status: Former    Packs/day: 2.00    Years: 29.00    Total pack years: 58.00    Types: Cigarettes    Quit date: 11/30/1994    Years since quitting: 27.3   Smokeless tobacco: Never  Vaping Use   Vaping Use: Never used  Substance and Sexual Activity   Alcohol use: Not Currently    Alcohol/week: 21.0 - 35.0 standard drinks of alcohol    Types: 21 - 35 Glasses of wine per week    Comment: 3-5 glasses of wine/ day   Drug use: No   Sexual activity: Not Currently  Other Topics Concern   Not on file  Social History Narrative   Not on file    Social Determinants of Health   Financial Resource Strain: Low Risk  (02/16/2021)   Overall Financial Resource Strain (CARDIA)    Difficulty of Paying Living Expenses: Not hard at all  Food Insecurity: No Food Insecurity (03/14/2022)   Hunger Vital Sign    Worried About Running Out of Food in the Last Year: Never true    [place] in the Last Year: Never true  Transportation Needs: No Transportation Needs (03/14/2022)   PRAPARE - Hydrologist (Medical): No    Lack of Transportation (Non-Medical): No  Physical Activity: Inactive (03/14/2022)  Exercise Vital Sign    Days of Exercise per Week: 0 days    Minutes of Exercise per Session: 0 min  Stress: Stress Concern Present (03/14/2022)   [place]    Feeling of Stress : To some extent  Social Connections: Moderately Isolated (03/14/2022)   Social Connection and Isolation Panel [NHANES]    Frequency of Communication with Friends and Family: More than three times a week    Frequency of Social Gatherings with Friends and Family: Once a week    Attends Religious Services: Never    Marine scientist or Organizations: No    Attends Music therapist: Never    Marital Status: Married    Tobacco Counseling Counseling given: Not Answered   Clinical Intake:  Pre-visit preparation completed: Yes  Pain : 0-10 Pain Score: 7  Pain Type: Chronic pain Pain Location: Shoulder Pain Orientation: Right Effect of Pain on Daily Activities: incapacitating     Nutritional Risks: None Diabetes: No  How often do you need to have someone help you when you read instructions, pamphlets, or other written materials from your doctor or pharmacy?: 1 - Never  Diabetic?no  Interpreter Needed?: No  Information entered by :: Kirke Shaggy, LPN   Activities of Daily Living    03/14/2022    9:30 AM  In your present state of  health, do you have any difficulty performing the following activities:  Hearing? 0  Vision? 0  Difficulty concentrating or making decisions? 0  Walking or climbing stairs? 1  Dressing or bathing? 0  Doing errands, shopping? 0  Preparing Food and eating ? N  Using the Toilet? N  In the past six months, have you accidently leaked urine? N  Do you have problems with loss of bowel control? N  Managing your Medications? N  Managing your Finances? N  Housekeeping or managing your Housekeeping? N    Patient Care Team: Cletis Athens, MD as PCP - General (Internal Medicine)  Indicate any recent Medical Services you may have received from other than [facility] providers in the past year (date may be approximate).     Assessment:   This is a routine wellness examination for Lachlan.  Hearing/Vision screen Hearing Screening - Comments:: No aids Vision Screening - Comments:: [facility]  Dietary issues and exercise activities discussed: Current Exercise Habits: The patient does not participate in regular exercise at present   Goals Addressed             This Visit's Progress    DIET - EAT MORE FRUITS AND VEGETABLES         Depression Screen    03/14/2022    9:27 AM 02/16/2021    9:38 AM 02/14/2020   11:13 AM  PHQ 2/9 Scores  PHQ - 2 Score 2 1 0  PHQ- 9 Score 5      Fall Risk    03/14/2022    9:30 AM 03/05/2022    4:38 PM 02/16/2021    9:48 AM 02/14/2020   11:13 AM  Fall Risk   Falls in the past year? 1 1 0 0  Number falls in past yr: 0 0 0 0  Injury with Fall? 1 1 0 0  Risk for fall due to : Impaired balance/gait;Impaired mobility;Orthopedic patient;History of fall(s) History of fall(s);Impaired balance/gait;Impaired mobility No Fall Risks   Follow up Falls evaluation completed;Falls prevention discussed Falls evaluation completed Falls evaluation completed  FALL RISK PREVENTION PERTAINING TO THE HOME:  Any stairs in or around the home? Yes  If so, are  there any without handrails? No  Home free of loose throw rugs in walkways, pet beds, electrical cords, etc? Yes  Adequate lighting in your home to reduce risk of falls? Yes   ASSISTIVE DEVICES UTILIZED TO PREVENT FALLS:  Life alert? No  Use of a cane, walker or w/c? Yes  Grab bars in the bathroom? Yes  Shower chair or bench in shower? No  Elevated toilet seat or a handicapped toilet? No    Cognitive Function:        03/14/2022    9:31 AM 02/16/2021    9:57 AM  6CIT Screen  What Year? 0 points 0 points  What month? 0 points 0 points  What time? 0 points 0 points  Count back from 20 0 points 0 points  Months in reverse 0 points 0 points  Repeat phrase 0 points 0 points  Total Score 0 points 0 points    Immunizations Immunization History  Administered Date(s) Administered   Fluad Quad(high Dose 65+) 02/02/2020, 01/11/2022   Influenza, High Dose Seasonal PF 01/16/2017, 11/27/2018, 01/15/2022   Influenza-Unspecified 01/21/2011, 01/16/2017, 01/16/2021   PFIZER Comirnaty(Gray Top)Covid-19 Tri-Sucrose Vaccine 10/22/2020   PFIZER(Purple Top)SARS-COV-2 Vaccination 04/18/2019, 05/09/2019, 02/14/2020   Pneumococcal Conjugate-13 09/24/2016   Pneumococcal Polysaccharide-23 01/02/2018   Tdap 11/12/2020, 11/12/2020   Zoster Recombinat (Shingrix) 01/06/2018, 05/04/2018    TDAP status: Up to date  Flu Vaccine status: Up to date  Pneumococcal vaccine status: Up to date  Covid-19 vaccine status: Completed vaccines  Qualifies for Shingles Vaccine? Yes   Zostavax completed No   Shingrix Completed?: Yes  Screening Tests Health Maintenance  Topic Date Due   COVID-19 Vaccine (5 - 2023-24 season) 12/07/2021   DTaP/Tdap/Td (3 - Td or Tdap) 11/13/2030   Pneumonia Vaccine 2+ Years old  Completed   INFLUENZA VACCINE  Completed   DEXA SCAN  Completed   Zoster Vaccines- Shingrix  Completed   HPV VACCINES  Aged Out   COLONOSCOPY (Pts 45-74yr Insurance coverage will need to be  confirmed)  Discontinued    Health Maintenance  Health Maintenance Due  Topic Date Due   COVID-19 Vaccine (5 - 2023-24 season) 12/07/2021    Colorectal cancer screening: No longer required.   Mammogram status: No longer required due to age.  Bone Density status: Completed 06/13/21. Results reflect: Bone density results: OSTEOPOROSIS. Repeat every 2 years.  Lung Cancer Screening: (Low Dose CT Chest recommended if Age 80-80years, 30 pack-year currently smoking OR have quit w/in 15years.) does not qualify.    Additional Screening:  Hepatitis C Screening: does not qualify; Completed no  Vision Screening: Recommended annual ophthalmology exams for early detection of glaucoma and other disorders of the eye. Is the patient up to date with their annual eye exam?  Yes  Who is the provider or what is the name of the office in which the patient attends annual eye exams? [facility] If pt is not established with a provider, would they like to be referred to a provider to establish care? No .   Dental Screening: Recommended annual dental exams for proper oral hygiene  Community Resource Referral / Chronic Care Management: CRR required this visit?  No   CCM required this visit?  No      Plan:     I have personally reviewed and noted the following in the patient's chart:  Medical and social history Use of alcohol, tobacco or illicit drugs  Current medications and supplements including opioid prescriptions. Patient is not currently taking opioid prescriptions. Functional ability and status Nutritional status Physical activity Advanced directives List of other physicians Hospitalizations, surgeries, and ER visits in previous 12 months Vitals Screenings to include cognitive, depression, and falls Referrals and appointments  In addition, I have reviewed and discussed with patient certain preventive protocols, quality metrics, and best practice recommendations. A written  personalized care plan for preventive services as well as general preventive health recommendations were provided to patient.     Dionisio David, LPN   [id number]   Nurse Notes: none

## 2022-03-21 NOTE — Progress Notes (Signed)
I have reviewed this visit and agree with the documentation.   

## 2022-03-27 ENCOUNTER — Telehealth: Payer: Self-pay

## 2022-03-27 NOTE — Telephone Encounter (Signed)
MEDICATION: oxycodone 3-'25MG'$   PHARMACY:[id number] [place] - [address]   Comments:   **Let patient know to contact pharmacy at the end of the day to make sure medication is ready. **  ** Please notify patient to allow 48-72 hours to process**  **Encourage patient to contact the pharmacy for refills or they can [facility]*

## 2022-04-09 ENCOUNTER — Telehealth: Payer: Self-pay

## 2022-04-09 NOTE — Telephone Encounter (Signed)
Provider aware. It was due to holiday.

## 2022-04-09 NOTE — Telephone Encounter (Signed)
Patient refused [place] visit last week due to holiday. Just an FYI.

## 2022-04-09 NOTE — Telephone Encounter (Signed)
Roselyn Reef called from [facility] and left a message saying that [facility] missed an OT visit with them.

## 2022-04-16 ENCOUNTER — Encounter: Payer: Self-pay | Admitting: Internal Medicine

## 2022-04-16 ENCOUNTER — Ambulatory Visit: Payer: Medicare Other | Admitting: Internal Medicine

## 2022-04-16 VITALS — BP 152/100 | Ht 66.0 in | Wt 137.0 lb

## 2022-04-16 DIAGNOSIS — S72001D Fracture of unspecified part of neck of right femur, subsequent encounter for closed fracture with routine healing: Secondary | ICD-10-CM | POA: Diagnosis not present

## 2022-04-16 DIAGNOSIS — D696 Thrombocytopenia, unspecified: Secondary | ICD-10-CM | POA: Diagnosis not present

## 2022-04-16 DIAGNOSIS — I10 Essential (primary) hypertension: Secondary | ICD-10-CM

## 2022-04-16 DIAGNOSIS — D500 Iron deficiency anemia secondary to blood loss (chronic): Secondary | ICD-10-CM | POA: Diagnosis not present

## 2022-04-16 DIAGNOSIS — D509 Iron deficiency anemia, unspecified: Secondary | ICD-10-CM | POA: Insufficient documentation

## 2022-04-16 LAB — CBC WITH DIFFERENTIAL/PLATELET
Absolute Monocytes: 655 cells/uL (ref 200–950)
Basophils Absolute: 30 cells/uL (ref 0–200)
Basophils Relative: 0.5 %
Eosinophils Absolute: 289 cells/uL (ref 15–500)
Eosinophils Relative: 4.9 %
HCT: 38.8 % (ref 35.0–45.0)
Hemoglobin: 12.6 g/dL (ref 11.7–15.5)
Lymphs Abs: 1056 cells/uL (ref 850–3900)
MCH: 28.4 pg (ref 27.0–33.0)
MCHC: 32.5 g/dL (ref 32.0–36.0)
MCV: 87.4 fL (ref 80.0–100.0)
MPV: 11.2 fL (ref 7.5–12.5)
Monocytes Relative: 11.1 %
Neutro Abs: 3870 cells/uL (ref 1500–7800)
Neutrophils Relative %: 65.6 %
Platelets: 326 10*3/uL (ref 140–400)
RBC: 4.44 10*6/uL (ref 3.80–5.10)
RDW: 14 % (ref 11.0–15.0)
Total Lymphocyte: 17.9 %
WBC: 5.9 10*3/uL (ref 3.8–10.8)

## 2022-04-16 LAB — EXTRA SPECIMEN

## 2022-04-16 NOTE — Assessment & Plan Note (Signed)
Check CBC 

## 2022-04-16 NOTE — Assessment & Plan Note (Signed)
Patient is walking with the help of a 3 pronged stick

## 2022-04-16 NOTE — Progress Notes (Signed)
Established Patient Office Visit  Subjective:  Patient ID: Sonya Park, female    DOB: Jul 30, 1940  Age: 82 y.o. MRN: [id number]  CC:  Chief Complaint  Patient presents with   Follow-up    HPI  Sonya Park presents for check up, pt h/o frature of rt femur an dislocated  houlder  Past Medical History:  Diagnosis Date   Arthritis    Back pain    Insomnia    Medical history non-contributory    Osteoporosis     Past Surgical History:  Procedure Laterality Date   ABDOMINAL HYSTERECTOMY     BREAST LUMPECTOMY Right    COLONOSCOPY WITH PROPOFOL N/A 07/18/2015   Procedure: COLONOSCOPY WITH PROPOFOL;  Surgeon: [name], MD;  Location: [facility] ENDOSCOPY;  Service: Endoscopy;  Laterality: N/A;   COSMETIC SURGERY     GASTRIC BYPASS     X2   HEMORROIDECTOMY     KNEE ARTHROPLASTY Right 12/13/2015   Procedure: COMPUTER ASSISTED TOTAL KNEE ARTHROPLASTY;  Surgeon: Dereck Leep, MD;  Location: [facility] ORS;  Service: Orthopedics;  Laterality: Right;   KNEE ARTHROSCOPY     REPLACEMENT TOTAL KNEE Left     Family History  Problem Relation Age of Onset   Liver disease Mother    Heart attack Father    Cancer Father    CAD Sister     Social History   Socioeconomic History   Marital status: Married    Spouse name: Kiylee Thoreson   Number of children: 1   Years of education: 12   Highest education level: Some college, no degree  Occupational History   Occupation: Retired  Tobacco Use   Smoking status: Former    Packs/day: 2.00    Years: 29.00    Total pack years: 58.00    Types: Cigarettes    Quit date: 11/30/1994    Years since quitting: 27.3   Smokeless tobacco: Never  Vaping Use   Vaping Use: Never used  Substance and Sexual Activity   Alcohol use: Not Currently    Alcohol/week: 21.0 - 35.0 standard drinks of alcohol    Types: 21 - 35 Glasses of wine per week    Comment: 3-5 glasses of wine/ day   Drug use: No   Sexual activity: Not Currently  Other Topics Concern    Not on file  Social History Narrative   Not on file   Social Determinants of Health   Financial Resource Strain: Low Risk  (02/16/2021)   Overall Financial Resource Strain (CARDIA)    Difficulty of Paying Living Expenses: Not hard at all  Food Insecurity: No Food Insecurity (03/14/2022)   Hunger Vital Sign    Worried About Running Out of Food in the Last Year: Never true    [place] in the Last Year: Never true  Transportation Needs: No Transportation Needs (03/14/2022)   PRAPARE - Hydrologist (Medical): No    Lack of Transportation (Non-Medical): No  Physical Activity: Inactive (03/14/2022)   Exercise Vital Sign    Days of Exercise per Week: 0 days    Minutes of Exercise per Session: 0 min  Stress: Stress Concern Present (03/14/2022)   [place]    Feeling of Stress : To some extent  Social Connections: Moderately Isolated (03/14/2022)   Social Connection and Isolation Panel [NHANES]    Frequency of Communication with Friends and Family: More than three  times a week    Frequency of Social Gatherings with Friends and Family: Once a week    Attends Religious Services: Never    Marine scientist or Organizations: No    Attends Archivist Meetings: Never    Marital Status: Married  Human resources officer Violence: Not At Risk (03/14/2022)   Humiliation, Afraid, Rape, and Kick questionnaire    Fear of Current or Ex-Partner: No    Emotionally Abused: No    Physically Abused: No    Sexually Abused: No     Current Outpatient Medications:    aspirin 81 MG tablet, Take 81 mg by mouth daily. Once a week (every Sunday), Disp: , Rfl:    b complex vitamins capsule, Take 1 capsule by mouth daily., Disp: , Rfl:    BIOTIN PO, Take 1 capsule by mouth daily., Disp: , Rfl:    calcium citrate-vitamin D (CITRACAL+D) 315-200 MG-UNIT tablet, Take by mouth., Disp: , Rfl:     Cholecalciferol 125 MCG (5000 UT) TABS, Take by mouth., Disp: , Rfl:    Copper Gluconate 2 MG CAPS, Take 4 mg by mouth daily., Disp: 60 capsule, Rfl: 3   FEROSUL 325 (65 Fe) MG tablet, TAKE 1 TABLET DAILY, Disp: 90 tablet, Rfl: 3   gabapentin (NEURONTIN) 100 MG capsule, Take 1 capsule (100 mg total) by mouth 3 (three) times daily., Disp: 90 capsule, Rfl: 0   ibandronate (BONIVA) 150 MG tablet, TAKE 1 TABLET EVERY 30 DAYS, Disp: 3 tablet, Rfl: 3   Melatonin 5 MG TABS, Take 2.5 mg by mouth. As needed, Disp: , Rfl:    metaxalone (SKELAXIN) 800 MG tablet, Take 800 mg by mouth 3 (three) times daily as needed., Disp: , Rfl:    Multiple Vitamins-Minerals (BARIATRIC MULTIVITAMINS/IRON PO), Take 2 capsules by mouth every morning. After breakfast., Disp: , Rfl:    multivitamin-lutein (OCUVITE-LUTEIN) CAPS capsule, Take 1 capsule daily by mouth. (Patient taking differently: Take 2 capsules by mouth daily.), Disp: , Rfl: 0   nystatin-triamcinolone ointment (MYCOLOG), Apply 1 application. topically 2 (two) times daily., Disp: 30 g, Rfl: 0   pantoprazole (PROTONIX) 40 MG tablet, Take 1 tablet (40 mg total) by mouth 2 (two) times daily before a meal., Disp: 180 tablet, Rfl: 1   PREVIDENT 5000 SENSITIVE 1.1-5 % GEL, Place 1 Application onto teeth as directed., Disp: , Rfl:    spironolactone (ALDACTONE) 25 MG tablet, Take 1 tablet (25 mg total) by mouth daily. (Patient taking differently: Take 25 mg by mouth every other day.), Disp: 90 tablet, Rfl: 3   thiamine 100 MG tablet, Take 1 tablet (100 mg total) daily by mouth., Disp: , Rfl:    vitamin B-12 500 MCG tablet, Take 1 tablet (500 mcg total) daily by mouth., Disp: , Rfl:    Allergies  Allergen Reactions   Chlorpheniramine    Penicillins Swelling    Has patient had a PCN reaction causing immediate rash, facial/tongue/throat swelling, SOB or lightheadedness with hypotension: Yes Has patient had a PCN reaction causing severe rash involving mucus membranes or  skin necrosis: No Has patient had a PCN reaction that required hospitalization: No Has patient had a PCN reaction occurring within the last 10 years: No If all of the above answers are "NO", then may proceed with Cephalosporin use. Other reaction(s): SWELLING Other reaction(s): other   Other Itching and Other (See Comments)    "mycin" doesn't know which one "mycin" doesn't know which one    ROS Review of  Systems  Constitutional: Negative.   HENT: Negative.    Eyes: Negative.   Respiratory: Negative.    Cardiovascular: Negative.   Gastrointestinal: Negative.   Endocrine: Negative.   Genitourinary: Negative.   Musculoskeletal: Negative.   Skin: Negative.   Allergic/Immunologic: Negative.   Neurological: Negative.   Hematological: Negative.   Psychiatric/Behavioral: Negative.    All other systems reviewed and are negative.     Objective:    Physical Exam Vitals reviewed.  Constitutional:      Appearance: Normal appearance.  HENT:     Mouth/Throat:     Mouth: Mucous membranes are moist.  Eyes:     Pupils: Pupils are equal, round, and reactive to light.  Neck:     Vascular: No carotid bruit.  Cardiovascular:     Rate and Rhythm: Normal rate and regular rhythm.     Pulses: Normal pulses.     Heart sounds: Normal heart sounds.  Pulmonary:     Effort: Pulmonary effort is normal.     Breath sounds: Normal breath sounds.  Abdominal:     General: Bowel sounds are normal.     Palpations: Abdomen is soft. There is no hepatomegaly, splenomegaly or mass.     Tenderness: There is no abdominal tenderness.     Hernia: No hernia is present.  Musculoskeletal:        General: No tenderness.     Cervical back: Neck supple.     Right lower leg: No edema.     Left lower leg: No edema.     Comments: Painful rt shoulder, painful rt knee  Skin:    Findings: No rash.  Neurological:     Mental Status: She is alert and oriented to person, place, and time.     Motor: No weakness.   Psychiatric:        Mood and Affect: Mood and affect normal.        Behavior: Behavior normal.     BP (!) 152/100   Ht '5\' 6"'$  (1.676 m)   Wt 137 lb (62.1 kg)   BMI 22.11 kg/m  Wt Readings from Last 3 Encounters:  04/16/22 137 lb (62.1 kg)  02/03/22 152 lb (68.9 kg)  10/19/21 152 lb 4.8 oz (69.1 kg)     Health Maintenance Due  Topic Date Due   COVID-19 Vaccine (5 - 2023-24 season) 12/07/2021    There are no preventive care reminders to display for this patient.  Lab Results  Component Value Date   TSH 2.465 02/15/2021   Lab Results  Component Value Date   WBC 10.1 02/03/2022   HGB 11.5 (L) 02/03/2022   HCT 36.4 02/03/2022   MCV 92.6 02/03/2022   PLT 240 02/03/2022   Lab Results  Component Value Date   NA 138 02/03/2022   K 3.9 02/03/2022   CO2 24 02/03/2022   GLUCOSE 115 (H) 02/03/2022   BUN 23 02/03/2022   CREATININE 0.75 02/03/2022   BILITOT 1.0 02/15/2021   ALKPHOS 53 02/15/2021   AST 24 02/15/2021   ALT 18 02/15/2021   PROT 6.5 02/15/2021   ALBUMIN 3.8 02/15/2021   CALCIUM 7.9 (L) 02/03/2022   ANIONGAP 8 02/03/2022   Lab Results  Component Value Date   CHOL 208 (H) 02/15/2021   Lab Results  Component Value Date   HDL 78 02/15/2021   Lab Results  Component Value Date   LDLCALC 105 (H) 02/15/2021   Lab Results  Component Value Date   TRIG  124 02/15/2021   Lab Results  Component Value Date   CHOLHDL 2.7 02/15/2021   Lab Results  Component Value Date   HGBA1C 4.8 04/11/2021      Assessment & Plan:   Problem List Items Addressed This Visit       Cardiovascular and Mediastinum   Essential hypertension - Primary    Stable at the present time        Musculoskeletal and Integument   Fracture of right hip ([place])    Patient is walking with the help of a 3 pronged stick        Hematopoietic and Hemostatic   Thrombocytopenia (HCC)    Will check CBC        Other   Iron deficiency anemia due to chronic blood loss    Check  CBC       No orders of the defined types were placed in this encounter.   Follow-up: No follow-ups on file.    Cletis Athens, MD

## 2022-04-16 NOTE — Assessment & Plan Note (Signed)
Will check CBC 

## 2022-04-16 NOTE — Assessment & Plan Note (Signed)
Stable at the present time. 

## 2022-04-16 NOTE — Addendum Note (Signed)
Addended by: Amado Coe on: 04/16/2022 12:15 PM   Modules accepted: Orders

## 2022-06-16 ENCOUNTER — Ambulatory Visit
Admission: RE | Admit: 2022-06-16 | Discharge: 2022-06-16 | Disposition: A | Discharge status: Home / Self Care | Payer: Medicare Other | Source: Ambulatory Visit | Attending: Emergency Medicine | Admitting: Emergency Medicine

## 2022-06-16 VITALS — BP 121/74 | HR 73 | Temp 98.1°F | Resp 18 | Ht 66.0 in | Wt 133.0 lb

## 2022-06-16 DIAGNOSIS — J0100 Acute maxillary sinusitis, unspecified: Secondary | ICD-10-CM | POA: Diagnosis not present

## 2022-06-16 MED ORDER — AZITHROMYCIN 250 MG PO TABS: 250.0000 mg | ORAL_TABLET | Freq: Every day | ORAL | 0 refills | Status: DC

## 2022-06-16 NOTE — ED Triage Notes (Signed)
Patient to Urgent Care with complaints of  nasal congestion, sinus pressure, productive cough w/ discolored mucus. Low energy.  Symptoms started 1 week ago. Using nasal spray.

## 2022-06-16 NOTE — ED Provider Notes (Signed)
Roderic Palau    CSN: [id number] Arrival date & time: 06/16/22  1104      History   Chief Complaint Chief Complaint  Patient presents with   Nasal Congestion    HPI Sonya Park is a 82 y.o. female.  Patient presents with 1 week history of sinus pressure, congestion, productive cough, fatigue.  Treatment attempted with nasal spray.  No OTC medication today.  She denies fever, rash, ear pain, sore throat, shortness of breath, chest pain, or other symptoms.  Her medical history includes hypertension, DVT, seasonal allergies.  The history is provided by the patient, medical records and the spouse.    Past Medical History:  Diagnosis Date   Arthritis    Back pain    Insomnia    Medical history non-contributory    Osteoporosis     Patient Active Problem List   Diagnosis Date Noted   Iron deficiency anemia due to chronic blood loss 04/16/2022   Closed displaced intertrochanteric fracture of right femur ([place]) 03/05/2022   Closed fracture of proximal end of right humerus 03/05/2022   Fracture of right hip ([place]) 02/03/2022   Shoulder fracture, right, closed, initial encounter 02/03/2022   Fall at home, initial encounter 02/03/2022   Neck pain 10/24/2021   Irritable bowel syndrome 10/14/2021   Obstructive sleep apnea of adult 10/14/2021   Vulvovaginitis 07/27/2021   Hypoglycemia 04/11/2021   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 01/16/2021   Vertigo 06/09/2020   Rotator cuff arthropathy of right shoulder 03/22/2020   Hyperlipidemia 02/21/2020   Need for COVID-19 vaccine 02/14/2020   Essential hypertension 09/02/2019   Seasonal allergic rhinitis due to pollen 09/02/2019   Chronic deep vein thrombosis (DVT) of proximal vein of left lower extremity ([place]) 11/17/2017   Lymphedema 08/11/2017   Anemia, macrocytic, nutritional    Macrocytic anemia    Dysphagia    Itching    Screening for colon  cancer    LFT elevation    Severe malnutrition (HCC)    Adult failure to thrive    Hyperbilirubinemia    Alcoholic liver failure (HCC)    Thrombocytopenia (HCC)    Pressure injury of skin 01/19/2017   Bacteremia    Palliative care by specialist    Goals of care, counseling/discussion    Alcohol abuse 01/15/2017   Facial trauma 01/14/2017   Osteoporosis 01/14/2017   Neutropenia ([place]) 01/14/2017   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   Special screening for malignant neoplasms, colon    Benign neoplasm of transverse colon    Fracture of right inferior pubic ramus ([place]) 09/17/2014   Compression fracture of L3 lumbar vertebra 09/17/2014   Closed wedge compression fracture of third lumbar vertebra ([place]) 09/17/2014   Other specified fracture of right pubis, initial encounter for closed fracture ([place]) 09/17/2014    Past Surgical History:  Procedure Laterality Date   ABDOMINAL HYSTERECTOMY     BREAST LUMPECTOMY Right    COLONOSCOPY WITH PROPOFOL N/A 07/18/2015   Procedure: COLONOSCOPY WITH PROPOFOL;  Surgeon: [name], MD;  Location: [facility] ENDOSCOPY;  Service: Endoscopy;  Laterality: N/A;   COSMETIC SURGERY     GASTRIC BYPASS     X2   HEMORROIDECTOMY     KNEE ARTHROPLASTY Right 12/13/2015   Procedure: COMPUTER ASSISTED TOTAL KNEE ARTHROPLASTY;  Surgeon: Dereck Leep, MD;  Location: [facility] ORS;  Service: Orthopedics;  Laterality: Right;   KNEE ARTHROSCOPY     REPLACEMENT TOTAL KNEE  Left    SHOULDER SURGERY Right     OB History   No obstetric history on file.      Home Medications    Prior to Admission medications   Medication Sig Start Date End Date Taking? Authorizing Provider  azithromycin (ZITHROMAX) 250 MG tablet Take 1 tablet (250 mg total) by mouth daily. Take first 2 tablets together, then 1 every day until finished. 06/16/22  Yes Sharion Balloon, NP  aspirin 81 MG tablet Take 81 mg by mouth daily. Once a week (every Sunday)    [provider]  b complex  vitamins capsule Take 1 capsule by mouth daily.    [provider]  BIOTIN PO Take 1 capsule by mouth daily.    [provider]  calcium citrate-vitamin D (CITRACAL+D) 315-200 MG-UNIT tablet Take by mouth.    [provider]  Cholecalciferol 125 MCG (5000 UT) TABS Take by mouth.    [provider]  Copper Gluconate 2 MG CAPS Take 4 mg by mouth daily. 02/26/17   Cammie Sickle, MD  FEROSUL 325 (65 Fe) MG tablet TAKE 1 TABLET DAILY 01/01/22   Cletis Athens, MD  gabapentin (NEURONTIN) 100 MG capsule Take 1 capsule (100 mg total) by mouth 3 (three) times daily. 03/12/22   Cletis Athens, MD  ibandronate (BONIVA) 150 MG tablet TAKE 1 TABLET EVERY 30 DAYS 11/07/21   Cletis Athens, MD  Melatonin 5 MG TABS Take 2.5 mg by mouth. As needed    [provider]  metaxalone (SKELAXIN) 800 MG tablet Take 800 mg by mouth 3 (three) times daily as needed. 10/11/21   [provider]  Multiple Vitamins-Minerals (BARIATRIC MULTIVITAMINS/IRON PO) Take 2 capsules by mouth every morning. After breakfast.    [provider]  multivitamin-lutein (OCUVITE-LUTEIN) CAPS capsule Take 1 capsule daily by mouth. Patient taking differently: Take 2 capsules by mouth daily. 02/12/17   Nicholes Mango, MD  nystatin-triamcinolone ointment (MYCOLOG) Apply 1 application. topically 2 (two) times daily. Patient not taking: Reported on 06/16/2022 07/27/21   Theresia Lo, NP  pantoprazole (PROTONIX) 40 MG tablet Take 1 tablet (40 mg total) by mouth 2 (two) times daily before a meal. 08/30/21   [name], MD  PREVIDENT 5000 SENSITIVE 1.1-5 % GEL Place 1 Application onto teeth as directed. 01/01/22   [provider]  spironolactone (ALDACTONE) 25 MG tablet Take 1 tablet (25 mg total) by mouth daily. Patient taking differently: Take 25 mg by mouth every other day. 07/26/21   Cletis Athens, MD  thiamine 100 MG tablet Take 1 tablet (100 mg total) daily by mouth. 02/12/17    Nicholes Mango, MD  vitamin B-12 500 MCG tablet Take 1 tablet (500 mcg total) daily by mouth. 02/12/17   Nicholes Mango, MD    Family History Family History  Problem Relation Age of Onset   Liver disease Mother    Heart attack Father    Cancer Father    CAD Sister     Social History Social History   Tobacco Use   Smoking status: Former    Packs/day: 2.00    Years: 29.00    Total pack years: 58.00    Types: Cigarettes    Quit date: 11/30/1994    Years since quitting: 27.5   Smokeless tobacco: Never  Vaping Use   Vaping Use: Never used  Substance Use Topics   Alcohol use: Not Currently    Alcohol/week: 21.0 - 35.0 standard drinks of alcohol  Types: 21 - 35 Glasses of wine per week    Comment: 3-5 glasses of wine/ day   Drug use: No     Allergies   Chlorpheniramine, Penicillins, and Other   Review of Systems Review of Systems  Constitutional:  Positive for fatigue. Negative for chills and fever.  HENT:  Positive for congestion, postnasal drip, rhinorrhea and sinus pressure. Negative for ear pain and sore throat.   Respiratory:  Positive for cough. Negative for shortness of breath.   Cardiovascular:  Negative for chest pain and palpitations.  Gastrointestinal:  Negative for abdominal pain, diarrhea and vomiting.  Skin:  Negative for rash.  All other systems reviewed and are negative.    Physical Exam Triage Vital Signs ED Triage Vitals  Enc Vitals Group     BP      Pulse      Resp      Temp      Temp src      SpO2      Weight      Height      Head Circumference      Peak Flow      Pain Score      Pain Loc      Pain Edu?      Excl. in [place]?    No data found.  Updated Vital Signs BP 121/74   Pulse 73   Temp 98.1 F (36.7 C)   Resp 18   Ht '5\' 6"'$  (1.676 m)   Wt 133 lb (60.3 kg)   SpO2 93%   BMI 21.47 kg/m   Visual Acuity Right Eye Distance:   Left Eye Distance:   Bilateral Distance:    Right Eye Near:   Left Eye Near:    Bilateral Near:      Physical Exam Vitals and nursing note reviewed.  Constitutional:      General: She is not in acute distress.    Appearance: She is well-developed. She is not ill-appearing.  HENT:     Right Ear: Tympanic membrane normal.     Left Ear: Tympanic membrane normal.     Nose: Congestion and rhinorrhea present.     Mouth/Throat:     Mouth: Mucous membranes are moist.     Pharynx: Oropharynx is clear.  Cardiovascular:     Rate and Rhythm: Normal rate and regular rhythm.     Heart sounds: Normal heart sounds.  Pulmonary:     Effort: Pulmonary effort is normal. No respiratory distress.     Breath sounds: Normal breath sounds.  Musculoskeletal:     Cervical back: Neck supple.  Skin:    General: Skin is warm and dry.  Neurological:     Mental Status: She is alert.  Psychiatric:        Mood and Affect: Mood normal.        Behavior: Behavior normal.      UC Treatments / Results  Labs (all labs ordered are listed, but only abnormal results are displayed) Labs Reviewed - No data to display  EKG   Radiology No results found.  Procedures Procedures (including critical care time)  Medications Ordered in UC Medications - No data to display  Initial Impression / Assessment and Plan / UC Course  I have reviewed the triage vital signs and the nursing notes.  Pertinent labs & imaging results that were available during my care of the patient were reviewed by me and considered in my medical decision making (see  chart for details).    Acute sinusitis.  Patient has been symptomatic for 1 week.  Treating with Zithromax.  Tylenol as needed.  Instructed patient to follow up with her PCP if her symptoms are not improving.  She agrees to plan of care.    Final Clinical Impressions(s) / UC Diagnoses   Final diagnoses:  Acute non-recurrent maxillary sinusitis     Discharge Instructions      Take the Zithromax as directed.   Follow up with your primary care provider if your  symptoms are not improving.        ED Prescriptions     Medication Sig Dispense Auth. Provider   azithromycin (ZITHROMAX) 250 MG tablet Take 1 tablet (250 mg total) by mouth daily. Take first 2 tablets together, then 1 every day until finished. 6 tablet Sharion Balloon, NP      PDMP not reviewed this encounter.   Sharion Balloon, NP 06/16/22 1226

## 2022-06-16 NOTE — Discharge Instructions (Addendum)
Take the Zithromax as directed.  Follow up with your primary care provider if your symptoms are not improving.   ° ° °

## 2022-07-04 ENCOUNTER — Ambulatory Visit
Admission: RE | Admit: 2022-07-04 | Discharge: 2022-07-04 | Disposition: A | Discharge status: Home / Self Care | Payer: Medicare Other | Source: Ambulatory Visit | Attending: Physician Assistant | Admitting: Physician Assistant

## 2022-07-04 ENCOUNTER — Other Ambulatory Visit: Payer: Self-pay | Admitting: Physician Assistant

## 2022-07-04 DIAGNOSIS — M19019 Primary osteoarthritis, unspecified shoulder: Secondary | ICD-10-CM

## 2022-07-04 DIAGNOSIS — G8929 Other chronic pain: Secondary | ICD-10-CM

## 2022-07-04 DIAGNOSIS — M25511 Pain in right shoulder: Secondary | ICD-10-CM | POA: Insufficient documentation

## 2022-07-04 DIAGNOSIS — S42291A Other displaced fracture of upper end of right humerus, initial encounter for closed fracture: Secondary | ICD-10-CM | POA: Insufficient documentation

## 2022-08-26 ENCOUNTER — Other Ambulatory Visit: Payer: Self-pay | Admitting: Gastroenterology

## 2022-10-30 ENCOUNTER — Other Ambulatory Visit: Payer: Self-pay | Admitting: Student

## 2022-10-30 DIAGNOSIS — S72141D Displaced intertrochanteric fracture of right femur, subsequent encounter for closed fracture with routine healing: Secondary | ICD-10-CM

## 2022-10-30 DIAGNOSIS — M79604 Pain in right leg: Secondary | ICD-10-CM

## 2022-10-31 ENCOUNTER — Ambulatory Visit
Admission: RE | Admit: 2022-10-31 | Discharge: 2022-10-31 | Disposition: A | Discharge status: Home / Self Care | Payer: Medicare Other | Source: Ambulatory Visit | Attending: Student | Admitting: Student

## 2022-10-31 DIAGNOSIS — M79604 Pain in right leg: Secondary | ICD-10-CM | POA: Diagnosis present

## 2022-10-31 DIAGNOSIS — S72141D Displaced intertrochanteric fracture of right femur, subsequent encounter for closed fracture with routine healing: Secondary | ICD-10-CM | POA: Insufficient documentation

## 2023-02-20 ENCOUNTER — Encounter (INDEPENDENT_AMBULATORY_CARE_PROVIDER_SITE_OTHER): Payer: Medicare Other | Admitting: Nurse Practitioner

## 2023-03-12 ENCOUNTER — Encounter: Payer: Self-pay | Admitting: Podiatry

## 2023-03-12 ENCOUNTER — Ambulatory Visit: Payer: Medicare Other | Admitting: Podiatry

## 2023-03-12 VITALS — Ht 66.0 in | Wt 133.0 lb

## 2023-03-12 DIAGNOSIS — M205X2 Other deformities of toe(s) (acquired), left foot: Secondary | ICD-10-CM | POA: Diagnosis not present

## 2023-03-12 DIAGNOSIS — M2042 Other hammer toe(s) (acquired), left foot: Secondary | ICD-10-CM

## 2023-03-12 NOTE — Patient Instructions (Signed)
More silicone pads can be purchased from:  [email or web address]  

## 2023-03-12 NOTE — Progress Notes (Signed)
  Subjective:  Patient ID: Sonya Park, female    DOB: 02/16/1941,  MRN: [id number]  Chief Complaint  Patient presents with   Toe Injury    pain/discolation of left little toe / hx of neuropathy    82 y.o. female presents with the above complaint. History confirmed with patient.   Objective:  Physical Exam: warm, good capillary refill, no trophic changes or ulcerative lesions, normal DP and PT pulses, abnormal sensory exam, varicose veins noted and adductovarus fifth toe contracture with dorsal skin thickening over the PIPJ and prominent proximal phalanx head.  Assessment:   1. Hammertoe of left foot   2. Adductovarus rotation of toe, acquired, left      Plan:  Patient was evaluated and treated and all questions answered.  Discussed etiology and treatment options of the fifth toe deformity, currently skin had no corn or callus formation that required debridement we discussed treatment of the toe deformity including wider shoes padding and surgical options.  Silicone toe pad and crest dispensed she will utilize these for now and return to see me if this is not improved for surgical consideration.  Return if symptoms worsen or fail to improve.

## 2023-03-14 ENCOUNTER — Ambulatory Visit: Payer: Medicare Other | Admitting: Nurse Practitioner

## 2023-03-14 ENCOUNTER — Encounter: Payer: Self-pay | Admitting: Nurse Practitioner

## 2023-03-14 ENCOUNTER — Other Ambulatory Visit
Admission: RE | Admit: 2023-03-14 | Discharge: 2023-03-14 | Disposition: A | Discharge status: Home / Self Care | Payer: Medicare Other | Attending: Nurse Practitioner | Admitting: Nurse Practitioner

## 2023-03-14 VITALS — BP 120/72 | HR 75 | Temp 98.3°F | Resp 16 | Ht 66.0 in | Wt 148.6 lb

## 2023-03-14 DIAGNOSIS — G4719 Other hypersomnia: Secondary | ICD-10-CM | POA: Insufficient documentation

## 2023-03-14 DIAGNOSIS — D708 Other neutropenia: Secondary | ICD-10-CM

## 2023-03-14 DIAGNOSIS — M810 Age-related osteoporosis without current pathological fracture: Secondary | ICD-10-CM | POA: Diagnosis not present

## 2023-03-14 DIAGNOSIS — K7040 Alcoholic hepatic failure without coma: Secondary | ICD-10-CM | POA: Insufficient documentation

## 2023-03-14 DIAGNOSIS — R5383 Other fatigue: Secondary | ICD-10-CM

## 2023-03-14 DIAGNOSIS — Z9884 Bariatric surgery status: Secondary | ICD-10-CM

## 2023-03-14 DIAGNOSIS — I10 Essential (primary) hypertension: Secondary | ICD-10-CM | POA: Insufficient documentation

## 2023-03-14 DIAGNOSIS — E162 Hypoglycemia, unspecified: Secondary | ICD-10-CM | POA: Insufficient documentation

## 2023-03-14 LAB — COMPREHENSIVE METABOLIC PANEL
ALT: 19 U/L (ref 0–44)
AST: 24 U/L (ref 15–41)
Albumin: 3.6 g/dL (ref 3.5–5.0)
Alkaline Phosphatase: 46 U/L (ref 38–126)
Anion gap: 7 (ref 5–15)
BUN: 16 mg/dL (ref 8–23)
CO2: 26 mmol/L (ref 22–32)
Calcium: 8.4 mg/dL — ABNORMAL LOW (ref 8.9–10.3)
Chloride: 108 mmol/L (ref 98–111)
Creatinine, Ser: 0.85 mg/dL (ref 0.44–1.00)
GFR, Estimated: >60 mL/min (ref >60)
Glucose, Bld: 209 mg/dL — ABNORMAL HIGH (ref 70–99)
Potassium: 4 mmol/L (ref 3.5–5.1)
Sodium: 141 mmol/L (ref 135–145)
Total Bilirubin: 0.7 mg/dL (ref <1.2)
Total Protein: 6 g/dL — ABNORMAL LOW (ref 6.5–8.1)

## 2023-03-14 LAB — LIPID PANEL
Cholesterol: 170 mg/dL (ref 0–200)
HDL: 81 mg/dL (ref >40)
LDL Cholesterol: 76 mg/dL (ref 0–99)
Total CHOL/HDL Ratio: 2.1 {ratio}
Triglycerides: 63 mg/dL (ref <150)
VLDL: 13 mg/dL (ref 0–40)

## 2023-03-14 LAB — FERRITIN: Ferritin: 166 ng/mL (ref 11–307)

## 2023-03-14 LAB — VITAMIN D 25 HYDROXY (VIT D DEFICIENCY, FRACTURES): Vit D, 25-Hydroxy: 22.02 ng/mL — ABNORMAL LOW (ref 30–100)

## 2023-03-14 LAB — CBC WITH DIFFERENTIAL/PLATELET
Abs Immature Granulocytes: 0.01 10*3/uL (ref 0.00–0.07)
Basophils Absolute: 0 10*3/uL (ref 0.0–0.1)
Basophils Relative: 1 %
Eosinophils Absolute: 0.2 10*3/uL (ref 0.0–0.5)
Eosinophils Relative: 4 %
HCT: 39 % (ref 36.0–46.0)
Hemoglobin: 12.4 g/dL (ref 12.0–15.0)
Immature Granulocytes: 0 %
Lymphocytes Relative: 24 %
Lymphs Abs: 1 10*3/uL (ref 0.7–4.0)
MCH: 30.9 pg (ref 26.0–34.0)
MCHC: 31.8 g/dL (ref 30.0–36.0)
MCV: 97.3 fL (ref 80.0–100.0)
Monocytes Absolute: 0.3 10*3/uL (ref 0.1–1.0)
Monocytes Relative: 8 %
Neutro Abs: 2.7 10*3/uL (ref 1.7–7.7)
Neutrophils Relative %: 63 %
Platelets: 239 10*3/uL (ref 150–400)
RBC: 4.01 MIL/uL (ref 3.87–5.11)
RDW: 13.5 % (ref 11.5–15.5)
WBC: 4.2 10*3/uL (ref 4.0–10.5)
nRBC: 0 % (ref 0.0–0.2)

## 2023-03-14 LAB — HEMOGLOBIN A1C
Hgb A1c MFr Bld: 4.4 % — ABNORMAL LOW (ref 4.8–5.6)
Mean Plasma Glucose: 79.58 mg/dL

## 2023-03-14 LAB — IRON AND TIBC
Iron: 104 ug/dL (ref 28–170)
Saturation Ratios: 30 % (ref 10.4–31.8)
TIBC: 343 ug/dL (ref 250–450)
UIBC: 239 ug/dL

## 2023-03-14 LAB — VITAMIN B12: Vitamin B-12: 7246 pg/mL — ABNORMAL HIGH (ref 180–914)

## 2023-03-14 LAB — FOLATE: Folate: 24 ng/mL (ref >5.9)

## 2023-03-14 LAB — T4, FREE: Free T4: 1.07 ng/dL (ref 0.61–1.12)

## 2023-03-14 LAB — TSH: TSH: 1.495 u[IU]/mL (ref 0.350–4.500)

## 2023-03-14 NOTE — Progress Notes (Signed)
[facility] [address] [place], [place] [address]  Internal MEDICINE  Office Visit Note  Patient Name: Sonya Park  [id number]  [id number]  Date of Service: 03/14/2023   Complaints/HPI Pt is here for establishment of PCP. Chief Complaint  Patient presents with   New Patient (Initial Visit)    HPI Sonya Park presents for a new patient visit to establish care.  Well-appearing 82 y.o. female with GERD, prior gastric bypass surgery, IBS, allergic rhinitis, and osteoporosis.  Work: retired  Home: lives at home with her husband Diet: fair Exercise: goes to PT Tobacco use: none, quit smoking 36 years ago Alcohol use: none , quit drinking in 2018 Routine mammogram: only as needed now, last one was October this year  DEXA scan: done Labs: due for routine labs  New or worsening pain: none Reports low energy -- eats well throughout the day. Recent fall, having trouble getting back to her normal since then. Her last fall was February 03, 2022. When she fell off her deck outside when blowing leaves.  Takes [facility]lia injections at [facility] twice yearly for osteoporosis -- sees endocrinology.  Also sees Orthopedic at [facility] clinic for shoulder injury last year  Seen by neurology for dementia, MCI and neuropathy.  Seen by podiatry 2 days ago for hammertoe Denies any polyuria, but admits polydipsia. No polyphagia but has a good appetite.   Current Medication: Outpatient Encounter Medications as of 03/14/2023  Medication Sig Note   Biotin (BIOTIN EXTRA STRENGTH) 10 MG CAPS Take by mouth.    Cholecalciferol (VITAMIN D) 50 MCG (2000 UT) CAPS Take by mouth.    Cyanocobalamin (B-12) 3000 MCG CAPS Take by mouth.    aspirin 81 MG tablet Take 81 mg by mouth daily. Once a week (every Sunday)    b complex vitamins capsule Take 1 capsule by mouth daily.    calcium citrate-vitamin D (CITRACAL+D) 315-200 MG-UNIT tablet Take by mouth.    Cholecalciferol 125 MCG (5000 UT)  TABS Take by mouth.    Copper Gluconate 2 MG CAPS Take 4 mg by mouth daily.    FEROSUL 325 (65 Fe) MG tablet TAKE 1 TABLET DAILY    Melatonin 5 MG TABS Take 2.5 mg by mouth. As needed    Multiple Vitamins-Minerals (BARIATRIC MULTIVITAMINS/IRON PO) Take 2 capsules by mouth every morning. After breakfast.    pantoprazole (PROTONIX) 40 MG tablet Take 1 tablet (40 mg total) by mouth 2 (two) times daily before a meal.    PREVIDENT 5000 SENSITIVE 1.1-5 % GEL Place 1 Application onto teeth as directed.    spironolactone (ALDACTONE) 25 MG tablet Take 1 tablet (25 mg total) by mouth daily. (Patient taking differently: Take 25 mg by mouth every other day.)    thiamine 100 MG tablet Take 1 tablet (100 mg total) daily by mouth.    [DISCONTINUED] azithromycin (ZITHROMAX) 250 MG tablet Take 1 tablet (250 mg total) by mouth daily. Take first 2 tablets together, then 1 every day until finished.    [DISCONTINUED] BIOTIN PO Take 1 capsule by mouth daily.    [DISCONTINUED] gabapentin (NEURONTIN) 100 MG capsule Take 1 capsule (100 mg total) by mouth 3 (three) times daily.    [DISCONTINUED] ibandronate (BONIVA) 150 MG tablet TAKE 1 TABLET EVERY 30 DAYS    [DISCONTINUED] metaxalone (SKELAXIN) 800 MG tablet Take 800 mg by mouth 3 (three) times daily as needed.    [DISCONTINUED] multivitamin-lutein (OCUVITE-LUTEIN) CAPS capsule Take 1 capsule daily by mouth. (  Patient taking differently: Take 2 capsules by mouth daily.) 02/04/2022: Pt taking Bariatric Multivitamin instead.   [DISCONTINUED] nystatin-triamcinolone ointment (MYCOLOG) Apply 1 application. topically 2 (two) times daily.    [DISCONTINUED] vitamin B-12 500 MCG tablet Take 1 tablet (500 mcg total) daily by mouth.    No facility-administered encounter medications on file as of 03/14/2023.    Surgical History: Past Surgical History:  Procedure Laterality Date   ABDOMINAL HYSTERECTOMY     BREAST LUMPECTOMY Right    COLONOSCOPY WITH PROPOFOL N/A 07/18/2015    Procedure: COLONOSCOPY WITH PROPOFOL;  Surgeon: Midge Minium, MD;  Location: [facility] ENDOSCOPY;  Service: Endoscopy;  Laterality: N/A;   COSMETIC SURGERY     GASTRIC BYPASS     X2   HEMORROIDECTOMY     KNEE ARTHROPLASTY Right 12/13/2015   Procedure: COMPUTER ASSISTED TOTAL KNEE ARTHROPLASTY;  Surgeon: Donato Heinz, MD;  Location: [facility] ORS;  Service: Orthopedics;  Laterality: Right;   KNEE ARTHROSCOPY     REPLACEMENT TOTAL KNEE Left    SHOULDER SURGERY Right     Medical History: Past Medical History:  Diagnosis Date   Arthritis    Back pain    Insomnia    Medical history non-contributory    Osteoporosis     Family History: Family History  Problem Relation Age of Onset   Liver disease Mother    Heart attack Father    Cancer Father    CAD Sister     Social History   Socioeconomic History   Marital status: Married    Spouse name: Idamay Stefanik   Number of children: 1   Years of education: 12   Highest education level: Some college, no degree  Occupational History   Occupation: Retired  Tobacco Use   Smoking status: Former    Current packs/day: 0.00    Average packs/day: 2.0 packs/day for 29.0 years (58.0 ttl pk-yrs)    Types: Cigarettes    Start date: 11/29/1965    Quit date: 11/30/1994    Years since quitting: 28.3   Smokeless tobacco: Never  Vaping Use   Vaping status: Never Used  Substance and Sexual Activity   Alcohol use: Not Currently    Alcohol/week: 21.0 - 35.0 standard drinks of alcohol    Types: 21 - 35 Glasses of wine per week    Comment: 3-5 glasses of wine/ day   Drug use: No   Sexual activity: Not Currently  Other Topics Concern   Not on file  Social History Narrative   Not on file   Social Determinants of Health   Financial Resource Strain: Low Risk  (07/24/2022)   Received from [facility], [facility]   Overall Financial Resource Strain (CARDIA)    Difficulty of Paying Living Expenses: Not hard at all   Food Insecurity: No Food Insecurity (07/24/2022)   Received from [facility], [facility]   Hunger Vital Sign    Worried About Running Out of Food in the Last Year: Never true    Ran Out of Food in the Last Year: Never true  Transportation Needs: No Transportation Needs (07/24/2022)   Received from [facility], [facility]   PRAPARE - Transportation    In the past 12 months, has lack of transportation kept you from medical appointments or from getting medications?: No    Lack of Transportation (Non-Medical): No  Physical Activity: Inactive (03/14/2022)   Exercise Vital Sign  Days of Exercise per Week: 0 days    Minutes of Exercise per Session: 0 min  Stress: Stress Concern Present (03/14/2022)   [employer] of Occupational Health - Occupational Stress Questionnaire    Feeling of Stress : To some extent  Social Connections: Moderately Isolated (03/14/2022)   Social Connection and Isolation Panel [NHANES]    Frequency of Communication with Friends and Family: More than three times a week    Frequency of Social Gatherings with Friends and Family: Once a week    Attends Religious Services: Never    Database administrator or Organizations: No    Attends Banker Meetings: Never    Marital Status: Married  Catering manager Violence: Not At Risk (03/14/2022)   Humiliation, Afraid, Rape, and Kick questionnaire    Fear of Current or Ex-Partner: No    Emotionally Abused: No    Physically Abused: No    Sexually Abused: No     Review of Systems  Constitutional:  Positive for activity change and fatigue. Negative for chills and unexpected weight change.  HENT:  Negative for congestion, postnasal drip, rhinorrhea, sneezing and sore throat.   Eyes:  Negative for redness.  Respiratory: Negative.  Negative for cough, chest tightness and shortness of breath.   Cardiovascular: Negative.  Negative for chest  pain and palpitations.  Gastrointestinal: Negative.  Negative for abdominal pain, constipation, diarrhea, nausea and vomiting.  Genitourinary:  Negative for dysuria and frequency.  Musculoskeletal:  Positive for arthralgias and gait problem. Negative for back pain, joint swelling and neck pain.  Skin:  Negative for rash.  Neurological:  Negative for tremors and numbness.  Hematological:  Negative for adenopathy. Does not bruise/bleed easily.  Psychiatric/Behavioral:  Negative for behavioral problems (Depression), sleep disturbance and suicidal ideas. The patient is not nervous/anxious.     Vital Signs: BP 120/72   Pulse 75   Temp 98.3 F (36.8 C)   Resp 16   Ht 5\' 6"  (1.676 m)   Wt 148 lb 9.6 oz (67.4 kg)   SpO2 96%   BMI 23.98 kg/m    Physical Exam Vitals reviewed.  Constitutional:      General: She is not in acute distress.    Appearance: Normal appearance. She is normal weight. She is not ill-appearing.  HENT:     Head: Normocephalic and atraumatic.  Eyes:     Pupils: Pupils are equal, round, and reactive to light.  Cardiovascular:     Rate and Rhythm: Normal rate and regular rhythm.  Pulmonary:     Effort: Pulmonary effort is normal. No respiratory distress.  Neurological:     Mental Status: She is alert and oriented to person, place, and time.  Psychiatric:        Mood and Affect: Mood normal.        Behavior: Behavior normal.       Assessment/Plan: 1. Other fatigue Routine labs ordered. Will evaluate for possible causes of continued fatigue and also check routine annual labs. Due for AWV at her next appointment.  - CBC with Differential/Platelet - CMP14+EGFR - Lipid Profile - Vitamin D (25 hydroxy) - B12 and Folate Panel - Vitamin B1 - TSH + free T4 - Iron, TIBC and Ferritin Panel - Hgb A1C w/o eAG  2. Excessive daytime sleepiness Routine labs ordered  - CBC with Differential/Platelet - CMP14+EGFR - Lipid Profile - Vitamin D (25 hydroxy) - B12  and Folate Panel - Vitamin B1 - TSH + free T4 -  Iron, TIBC and Ferritin Panel - Hgb A1C w/o eAG  3. Osteoporosis without current pathological fracture, unspecified osteoporosis type Routine labs ordered Patient sees [facility] for twice yearly prolia injections  - CBC with Differential/Platelet - CMP14+EGFR - Lipid Profile - Vitamin D (25 hydroxy) - B12 and Folate Panel - Vitamin B1 - TSH + free T4 - Iron, TIBC and Ferritin Panel - Hgb A1C w/o eAG  4. Other neutropenia (HCC) Routine labs ordered  - CBC with Differential/Platelet - B12 and Folate Panel - Iron, TIBC and Ferritin Panel  5. S/P gastric bypass All OTC supplements updated and added to her medication list. Routine labs ordered  - Biotin (BIOTIN EXTRA STRENGTH) 10 MG CAPS; Take by mouth. - Cyanocobalamin (B-12) 3000 MCG CAPS; Take by mouth. - Cholecalciferol (VITAMIN D) 50 MCG (2000 UT) CAPS; Take by mouth. - CBC with Differential/Platelet - CMP14+EGFR - Lipid Profile - Vitamin D (25 hydroxy) - B12 and Folate Panel - Vitamin B1 - TSH + free T4 - Iron, TIBC and Ferritin Panel - Hgb A1C w/o eAG    General Counseling: Sonya Park verbalizes understanding of the findings of todays visit and agrees with plan of treatment. I have discussed any further diagnostic evaluation that may be needed or ordered today. We also reviewed her medications today. she has been encouraged to call the office with any questions or concerns that should arise related to todays visit.    Orders Placed This Encounter  Procedures   CBC with Differential/Platelet   CMP14+EGFR   Lipid Profile   Vitamin D (25 hydroxy)   B12 and Folate Panel   Vitamin B1   TSH + free T4   Iron, TIBC and Ferritin Panel   Hgb A1C w/o eAG    No orders of the defined types were placed in this encounter.   Return in about 2 weeks (around 03/28/2023) for medicare AWV, Labs, Alanny Rivers PCP will discuss lab results at that time .  Time spent:30 Minutes  Time spent with patient included reviewing progress notes, labs, imaging studies, and discussing plan for follow up.   [place] Controlled Substance Database was reviewed by me for overdose risk score (ORS)   This patient was seen by Sallyanne Kuster, FNP-C in collaboration with Dr. Beverely Risen as a part of collaborative care agreement.   Virda Betters R. Tedd Sias, MSN, FNP-C Internal Medicine

## 2023-03-18 LAB — VITAMIN B1: Vitamin B1 (Thiamine): 243 nmol/L — ABNORMAL HIGH (ref 66.5–200.0)

## 2023-03-21 ENCOUNTER — Ambulatory Visit: Payer: Medicare Other | Admitting: Physician Assistant

## 2023-03-25 ENCOUNTER — Ambulatory Visit (INDEPENDENT_AMBULATORY_CARE_PROVIDER_SITE_OTHER): Payer: Medicare Other | Admitting: Nurse Practitioner

## 2023-03-25 ENCOUNTER — Encounter (INDEPENDENT_AMBULATORY_CARE_PROVIDER_SITE_OTHER): Payer: Self-pay | Admitting: Nurse Practitioner

## 2023-03-25 VITALS — BP 124/73 | HR 75 | Resp 16 | Wt 147.4 lb

## 2023-03-25 DIAGNOSIS — I10 Essential (primary) hypertension: Secondary | ICD-10-CM

## 2023-03-25 DIAGNOSIS — I8312 Varicose veins of left lower extremity with inflammation: Secondary | ICD-10-CM

## 2023-03-25 DIAGNOSIS — I8311 Varicose veins of right lower extremity with inflammation: Secondary | ICD-10-CM

## 2023-03-26 NOTE — Progress Notes (Signed)
Subjective:    Patient ID: Charlestine Massed, female    DOB: [birth year], 82 y.o.   MRN: [id number] Chief Complaint  Patient presents with   New Patient (Initial Visit)    Ref [facility] consult asymptomatic ble varicose veins    The patient is an 82 year old female who presents today for evaluation of bilateral varicose veins.  She notes that she has had veins present in her legs for years and has recently noted that her legs become very discolored at the end of the day.  They feel weak and tired after she has been up and on her legs through the day.  She has persistent swelling in the right lower extremity after an injury and repair of right femur following fracture.  She additionally has the reasonable concern for possible bleeding of her varicosities.  Currently there are no open wounds or ulcerations.  There is currently no weeping.    Review of Systems  Cardiovascular:  Positive for leg swelling.  Skin:  Positive for color change.  All other systems reviewed and are negative.      Objective:   Physical Exam Vitals reviewed.  HENT:     Head: Normocephalic.  Cardiovascular:     Rate and Rhythm: Normal rate.     Pulses: Normal pulses.  Pulmonary:     Effort: Pulmonary effort is normal.  Musculoskeletal:     Right lower leg: Edema present.  Skin:    General: Skin is warm and dry.  Neurological:     Mental Status: She is alert and oriented to person, place, and time.  Psychiatric:        Mood and Affect: Mood normal.        Behavior: Behavior normal.        Thought Content: Thought content normal.        Judgment: Judgment normal.    BP 124/73   Pulse 75   Resp 16   Wt 147 lb 6.4 oz (66.9 kg)   BMI 23.79 kg/m   Past Medical History:  Diagnosis Date   Arthritis    Back pain    Insomnia    Medical history non-contributory    Osteoporosis     Social History   Socioeconomic History   Marital status: Married    Spouse name: Kerri-Ann Wildes   Number of children: 1    Years of education: 12   Highest education level: Some college, no degree  Occupational History   Occupation: Retired  Tobacco Use   Smoking status: Former    Current packs/day: 0.00    Average packs/day: 2.0 packs/day for 29.0 years (58.0 ttl pk-yrs)    Types: Cigarettes    Start date: 11/29/1965    Quit date: 11/30/1994    Years since quitting: 28.3   Smokeless tobacco: Never  Vaping Use   Vaping status: Never Used  Substance and Sexual Activity   Alcohol use: Not Currently    Alcohol/week: 21.0 - 35.0 standard drinks of alcohol    Types: 21 - 35 Glasses of wine per week    Comment: 3-5 glasses of wine/ day   Drug use: No   Sexual activity: Not Currently  Other Topics Concern   Not on file  Social History Narrative   Not on file   Social Drivers of Health   Financial Resource Strain: Low Risk  (07/24/2022)   Received from [facility], [facility]   Overall Financial Resource Strain (CARDIA)  Difficulty of Paying Living Expenses: Not hard at all  Food Insecurity: No Food Insecurity (07/24/2022)   Received from [facility], [facility]   Hunger Vital Sign    Worried About Running Out of Food in the Last Year: Never true    Ran Out of Food in the Last Year: Never true  Transportation Needs: No Transportation Needs (07/24/2022)   Received from [facility], [facility]   [facility] - Transportation    In the past 12 months, has lack of transportation kept you from medical appointments or from getting medications?: No    Lack of Transportation (Non-Medical): No  Physical Activity: Inactive (03/14/2022)   Exercise Vital Sign    Days of Exercise per Week: 0 days    Minutes of Exercise per Session: 0 min  Stress: Stress Concern Present (03/14/2022)   [employer] of Occupational Health - Occupational Stress Questionnaire    Feeling of Stress : To some extent  Social  Connections: Moderately Isolated (03/14/2022)   Social Connection and Isolation Panel [NHANES]    Frequency of Communication with Friends and Family: More than three times a week    Frequency of Social Gatherings with Friends and Family: Once a week    Attends Religious Services: Never    Database administrator or Organizations: No    Attends Banker Meetings: Never    Marital Status: Married  Catering manager Violence: Not At Risk (03/14/2022)   Humiliation, Afraid, Rape, and Kick questionnaire    Fear of Current or Ex-Partner: No    Emotionally Abused: No    Physically Abused: No    Sexually Abused: No    Past Surgical History:  Procedure Laterality Date   ABDOMINAL HYSTERECTOMY     BREAST LUMPECTOMY Right    COLONOSCOPY WITH PROPOFOL N/A 07/18/2015   Procedure: COLONOSCOPY WITH PROPOFOL;  Surgeon: Midge Minium, MD;  Location: [facility] ENDOSCOPY;  Service: Endoscopy;  Laterality: N/A;   COSMETIC SURGERY     GASTRIC BYPASS     X2   HEMORROIDECTOMY     KNEE ARTHROPLASTY Right 12/13/2015   Procedure: COMPUTER ASSISTED TOTAL KNEE ARTHROPLASTY;  Surgeon: Donato Heinz, MD;  Location: [facility] ORS;  Service: Orthopedics;  Laterality: Right;   KNEE ARTHROSCOPY     REPLACEMENT TOTAL KNEE Left    SHOULDER SURGERY Right     Family History  Problem Relation Age of Onset   Liver disease Mother    Heart attack Father    Cancer Father    CAD Sister     Allergies  Allergen Reactions   Chlorpheniramine    Penicillins Swelling    Has patient had a PCN reaction causing immediate rash, facial/tongue/throat swelling, SOB or lightheadedness with hypotension: Yes Has patient had a PCN reaction causing severe rash involving mucus membranes or skin necrosis: No Has patient had a PCN reaction that required hospitalization: No Has patient had a PCN reaction occurring within the last 10 years: No If all of the above answers are "NO", then may proceed with Cephalosporin use. Other  reaction(s): SWELLING Other reaction(s): other   Other Itching and Other (See Comments)    "mycin" doesn't know which one "mycin" doesn't know which one       Latest Ref Rng & Units 03/14/2023    2:39 PM 04/16/2022   12:32 PM 02/03/2022    7:01 PM  CBC  WBC 4.0 - 10.5 K/uL 4.2  5.9  10.1   Hemoglobin 12.0 - 15.0 g/dL 28.4  13.2  44.0   Hematocrit 36.0 - 46.0 % 39.0  38.8  36.4   Platelets 150 - 400 K/uL 239  326  240       CMP     Component Value Date/Time   NA 141 03/14/2023 1439   K 4.0 03/14/2023 1439   CL 108 03/14/2023 1439   CO2 26 03/14/2023 1439   GLUCOSE 209 (H) 03/14/2023 1439   BUN 16 03/14/2023 1439   CREATININE 0.85 03/14/2023 1439   CREATININE 0.68 02/15/2020 1528   CALCIUM 8.4 (L) 03/14/2023 1439   PROT 6.0 (L) 03/14/2023 1439   ALBUMIN 3.6 03/14/2023 1439   AST 24 03/14/2023 1439   ALT 19 03/14/2023 1439   ALKPHOS 46 03/14/2023 1439   BILITOT 0.7 03/14/2023 1439   GFRNONAA >60 03/14/2023 1439   GFRNONAA 84 02/15/2020 1528     No results found.     Assessment & Plan:   1. 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  Patient will begin wearing graduated compression stockings class 1 on a daily basis, beginning first thing in the morning and removing them in the evening. The patient is instructed specifically not to sleep in the stockings.    The patient  will also begin using over-the-counter analgesics such as Motrin 600 mg po TID to help control the symptoms.    In addition, behavioral modification including elevation during the day will be initiated.    An ultrasound of the venous system will be obtained.   Further plans will be based on the ultrasound results   2. Essential hypertension Continue antihypertensive  medications as already ordered, these medications have been reviewed and there are no changes at this time.   Current Outpatient Medications on File Prior to Visit  Medication Sig Dispense Refill   aspirin 81 MG tablet Take 81 mg by mouth daily. Once a week (every Sunday)     b complex vitamins capsule Take 1 capsule by mouth daily.     Biotin (BIOTIN EXTRA STRENGTH) 10 MG CAPS Take by mouth.     calcium citrate-vitamin D (CITRACAL+D) 315-200 MG-UNIT tablet Take by mouth.     Cholecalciferol (VITAMIN D) 50 MCG (2000 UT) CAPS Take by mouth.     Cholecalciferol 125 MCG (5000 UT) TABS Take by mouth.     Copper Gluconate 2 MG CAPS Take 4 mg by mouth daily. 60 capsule 3   Cyanocobalamin (B-12) 3000 MCG CAPS Take by mouth.     FEROSUL 325 (65 Fe) MG tablet TAKE 1 TABLET DAILY 90 tablet 3   Melatonin 5 MG TABS Take 2.5 mg by mouth. As needed     Multiple Vitamins-Minerals (BARIATRIC MULTIVITAMINS/IRON PO) Take 2 capsules by mouth every morning. After breakfast.     pantoprazole (PROTONIX) 40 MG tablet Take 1 tablet (40 mg total) by mouth 2 (two) times daily before a meal. 180 tablet 1   PREVIDENT 5000 SENSITIVE 1.1-5 % GEL Place 1 Application onto teeth as directed.     spironolactone (ALDACTONE) 25 MG tablet Take 1 tablet (25 mg total) by mouth daily. (Patient taking differently: Take 25 mg by mouth every other day.) 90 tablet 3   thiamine 100 MG tablet Take  1 tablet (100 mg total) daily by mouth.     No current facility-administered medications on file prior to visit.    There are no Patient Instructions on file for this visit. No follow-ups on file.   Georgiana Spinner, NP

## 2023-04-11 ENCOUNTER — Encounter: Payer: Self-pay | Admitting: Nurse Practitioner

## 2023-04-11 ENCOUNTER — Ambulatory Visit (INDEPENDENT_AMBULATORY_CARE_PROVIDER_SITE_OTHER): Payer: Medicare Other | Admitting: Nurse Practitioner

## 2023-04-11 VITALS — BP 124/68 | HR 86 | Temp 98.5°F | Resp 16 | Ht 66.0 in | Wt 148.6 lb

## 2023-04-11 DIAGNOSIS — E559 Vitamin D deficiency, unspecified: Secondary | ICD-10-CM

## 2023-04-11 DIAGNOSIS — M810 Age-related osteoporosis without current pathological fracture: Secondary | ICD-10-CM

## 2023-04-11 DIAGNOSIS — Z9884 Bariatric surgery status: Secondary | ICD-10-CM

## 2023-04-11 DIAGNOSIS — Z0000 Encounter for general adult medical examination without abnormal findings: Secondary | ICD-10-CM

## 2023-04-11 NOTE — Patient Instructions (Signed)
 Try temporary decrease in thiamine and vitamin B12, but if feeling worse with decreasing those, increase back to original dose  Increase vitamin D to 10,000 units daily.

## 2023-04-11 NOTE — Progress Notes (Signed)
 [facility] [address] [place], [place] [address]  Internal MEDICINE  Office Visit Note  Patient Name: Sonya Park  [id number]  [id number]  Date of Service: 04/11/2023  Chief Complaint  Patient presents with   Medicare Wellness    HPI Sonya Park presents for a medicare annual wellness visit. Patient accompanied by her husband to her office visit today.  Well-appearing 83 y.o. female with GERD, prior gastric bypass surgery, IBS, allergic rhinitis, and osteoporosis.  Has had Low glucose levels since having gastric bypass. A1c is also low at 4.4 but her nonfasting glucose on her labs was 209.  Labs: reviewed labs  New or worsening pain: none  Other concerns: fatigue, weakness and low energy. These symptoms are most likely multifactorial -- has low vitamin D  and low calcium . Thiamine  and B12 levels are high and she takes supplements but she also has difficulty absorbing nutrients through her GI tract due to the gastric bypass.  She is also on prolia injections every 6 months which can cause significant fatigue and asthenia. This was started in march 2024 via [facility] in [place]. Her initial consult was with Dr. Therisa Park and she also sees Sonya Park the PA at their office.        04/11/2023   10:31 AM  MMSE - Mini Mental State Exam  Orientation to time 5  Orientation to Place 5  Registration 3  Attention/ Calculation 5  Recall 3  Language- name 2 objects 2  Language- repeat 1  Language- follow 3 step command 3  Language- read & follow direction 1  Write a sentence 1  Copy design 1  Total score 30    Functional Status Survey: Is the patient deaf or have difficulty hearing?: No Does the patient have difficulty seeing, even when wearing glasses/contacts?: No Does the patient have difficulty concentrating, remembering, or making decisions?: No Does the patient have difficulty walking or climbing stairs?: Yes Does the patient have  difficulty dressing or bathing?: Yes Does the patient have difficulty doing errands alone such as visiting a doctor's office or shopping?: No     02/04/2022    7:02 PM 03/05/2022    4:38 PM 03/14/2022    9:30 AM 03/14/2023   10:21 AM 04/11/2023   10:26 AM  Fall Risk  Falls in the past year?  1 1 1  0  Was there an injury with Fall?  1 1 1  0  Fall Risk Category Calculator  2 2 2  0  Fall Risk Category (Retired)  Moderate Moderate    (RETIRED) Patient Fall Risk Level High fall risk Moderate fall risk Moderate fall risk    Patient at Risk for Falls Due to  History of fall(s);Impaired balance/gait;Impaired mobility Impaired balance/gait;Impaired mobility;Orthopedic patient;History of fall(s)  No Fall Risks  Fall risk Follow up  Falls evaluation completed Falls evaluation completed;Falls prevention discussed Falls evaluation completed Falls evaluation completed       04/11/2023   10:26 AM  Depression screen PHQ 2/9  Decreased Interest 0  Down, Depressed, Hopeless 0  PHQ - 2 Score 0       Current Medication: Outpatient Encounter Medications as of 04/11/2023  Medication Sig   aspirin  81 MG tablet Take 81 mg by mouth daily. Once a week (every Sunday)   b complex vitamins capsule Take 1 capsule by mouth daily.   Biotin (BIOTIN EXTRA STRENGTH) 10 MG CAPS Take by mouth.   calcium  citrate-vitamin D  (CITRACAL+D) 315-200 MG-UNIT tablet  Take by mouth.   Cholecalciferol  (VITAMIN D ) 50 MCG (2000 UT) CAPS Take by mouth.   Cholecalciferol  125 MCG (5000 UT) TABS Take by mouth.   Copper  Gluconate 2 MG CAPS Take 4 mg by mouth daily.   Cyanocobalamin  (B-12) 3000 MCG CAPS Take by mouth.   FEROSUL 325 (65 Fe) MG tablet TAKE 1 TABLET DAILY   Melatonin 5 MG TABS Take 2.5 mg by mouth. As needed   Multiple Vitamins-Minerals (BARIATRIC MULTIVITAMINS/IRON PO) Take 2 capsules by mouth every morning. After breakfast.   pantoprazole  (PROTONIX ) 40 MG tablet Take 1 tablet (40 mg total) by mouth 2 (two) times daily  before a meal.   PREVIDENT 5000 SENSITIVE 1.1-5 % GEL Place 1 Application onto teeth as directed.   spironolactone  (ALDACTONE ) 25 MG tablet Take 1 tablet (25 mg total) by mouth daily. (Patient taking differently: Take 25 mg by mouth every other day.)   thiamine  100 MG tablet Take 1 tablet (100 mg total) daily by mouth.   No facility-administered encounter medications on file as of 04/11/2023.    Surgical History: Past Surgical History:  Procedure Laterality Date   ABDOMINAL HYSTERECTOMY     BREAST LUMPECTOMY Right    COLONOSCOPY WITH PROPOFOL  N/A 07/18/2015   Procedure: COLONOSCOPY WITH PROPOFOL ;  Surgeon: Rogelia Copping, MD;  Location: [facility] ENDOSCOPY;  Service: Endoscopy;  Laterality: N/A;   COSMETIC SURGERY     GASTRIC BYPASS     X2   HEMORROIDECTOMY     KNEE ARTHROPLASTY Right 12/13/2015   Procedure: COMPUTER ASSISTED TOTAL KNEE ARTHROPLASTY;  Surgeon: Lynwood SHAUNNA Hue, MD;  Location: [facility] ORS;  Service: Orthopedics;  Laterality: Right;   KNEE ARTHROSCOPY     REPLACEMENT TOTAL KNEE Left    SHOULDER SURGERY Right     Medical History: Past Medical History:  Diagnosis Date   Arthritis    Back pain    Insomnia    Medical history non-contributory    Osteoporosis     Family History: Family History  Problem Relation Age of Onset   Liver disease Mother    Heart attack Father    Cancer Father    CAD Sister     Social History   Socioeconomic History   Marital status: Married    Spouse name: Sonya Park   Number of children: 1   Years of education: 12   Highest education level: Some college, no degree  Occupational History   Occupation: Retired  Tobacco Use   Smoking status: Former    Current packs/day: 0.00    Average packs/day: 2.0 packs/day for 29.0 years (58.0 ttl pk-yrs)    Types: Cigarettes    Start date: 11/29/1965    Quit date: 11/30/1994    Years since quitting: 28.3   Smokeless tobacco: Never  Vaping Use   Vaping status: Never Used  Substance and Sexual  Activity   Alcohol use: Not Currently    Alcohol/week: 21.0 - 35.0 standard drinks of alcohol    Types: 21 - 35 Glasses of wine per week    Comment: 3-5 glasses of wine/ day   Drug use: No   Sexual activity: Not Currently  Other Topics Concern   Not on file  Social History Narrative   Not on file   Social Drivers of Health   Financial Resource Strain: Low Risk  (07/24/2022)   Received from [facility], [facility]   Overall Financial Resource Strain (CARDIA)    Difficulty of Paying Living  Expenses: Not hard at all  Food Insecurity: No Food Insecurity (07/24/2022)   Received from [facility], [facility]   Hunger Vital Sign    Worried About Running Out of Food in the Last Year: Never true    Ran Out of Food in the Last Year: Never true  Transportation Needs: No Transportation Needs (07/24/2022)   Received from [facility], [facility]   [facility] - Transportation    In the past 12 months, has lack of transportation kept you from medical appointments or from getting medications?: No    Lack of Transportation (Non-Medical): No  Physical Activity: Inactive (03/14/2022)   Exercise Vital Sign    Days of Exercise per Week: 0 days    Minutes of Exercise per Session: 0 min  Stress: Stress Concern Present (03/14/2022)   [employer] of Occupational Health - Occupational Stress Questionnaire    Feeling of Stress : To some extent  Social Connections: Moderately Isolated (03/14/2022)   Social Connection and Isolation Panel [NHANES]    Frequency of Communication with Friends and Family: More than three times a week    Frequency of Social Gatherings with Friends and Family: Once a week    Attends Religious Services: Never    Database Administrator or Organizations: No    Attends Banker Meetings: Never    Marital Status: Married  Catering Manager Violence: Not At Risk  (03/14/2022)   Humiliation, Afraid, Rape, and Kick questionnaire    Fear of Current or Ex-Partner: No    Emotionally Abused: No    Physically Abused: No    Sexually Abused: No      Review of Systems  Constitutional:  Positive for activity change and fatigue. Negative for chills and unexpected weight change.  HENT:  Negative for congestion, postnasal drip, rhinorrhea, sneezing and sore throat.   Eyes:  Negative for redness.  Respiratory: Negative.  Negative for cough, chest tightness and shortness of breath.   Cardiovascular: Negative.  Negative for chest pain and palpitations.  Gastrointestinal: Negative.  Negative for abdominal pain, constipation, diarrhea, nausea and vomiting.  Genitourinary:  Negative for dysuria and frequency.  Musculoskeletal:  Positive for arthralgias and gait problem. Negative for back pain, joint swelling and neck pain.  Skin:  Negative for rash.  Neurological:  Positive for weakness. Negative for tremors and numbness.  Hematological:  Negative for adenopathy. Does not bruise/bleed easily.  Psychiatric/Behavioral:  Negative for behavioral problems (Depression), sleep disturbance and suicidal ideas. The patient is not nervous/anxious.     Vital Signs: BP 124/68   Pulse 86   Temp 98.5 F (36.9 C)   Resp 16   Ht 5' 6 (1.676 m)   Wt 148 lb 9.6 oz (67.4 kg)   SpO2 97%   BMI 23.98 kg/m    Physical Exam Vitals reviewed.  Constitutional:      General: She is not in acute distress.    Appearance: Normal appearance. She is normal weight. She is not ill-appearing or diaphoretic.  HENT:     Head: Normocephalic and atraumatic.  Eyes:     Extraocular Movements: Extraocular movements intact.     Conjunctiva/sclera: Conjunctivae normal.     Pupils: Pupils are equal, round, and reactive to light.  Cardiovascular:     Rate and Rhythm: Normal rate and regular rhythm.     Heart sounds: No murmur heard.    No friction rub. No gallop.  Pulmonary:  Effort:  Pulmonary effort is normal. No respiratory distress.     Breath sounds: Normal breath sounds. No wheezing.  Skin:    General: Skin is warm and dry.     Capillary Refill: Capillary refill takes less than 2 seconds.  Neurological:     Mental Status: She is alert and oriented to person, place, and time.  Psychiatric:        Mood and Affect: Mood normal.        Behavior: Behavior normal.        Assessment/Plan: 1. Encounter for subsequent annual wellness visit (AWV) in Medicare patient (Primary) Age-appropriate preventive screenings and vaccinations discussed. Routine labs for health maintenance results discussed with patient today. PHM updated.    2. Vitamin D  deficiency Increase OTC vitamin D  supplement to 10000 units daily   3. Hypocalcemia Continue OTC calcium  supplement  4. Osteoporosis without current pathological fracture, unspecified osteoporosis type Will let her endocrinologist know her symptoms of significant fatigue and weakness so that they can determine if prolia is still the best choice of medication or if an alternative treatment needs to be considered   5. S/P gastric bypass Wil temporarily decrease thiamine  and B12 supplement to see if this improves her fatigue or weakness, if symptoms worsen, patient will return to previous OTC doses of thiamine  and B12.       General Counseling: Deshauna verbalizes understanding of the findings of todays visit and agrees with plan of treatment. I have discussed any further diagnostic evaluation that may be needed or ordered today. We also reviewed her medications today. she has been encouraged to call the office with any questions or concerns that should arise related to todays visit.    No orders of the defined types were placed in this encounter.   No orders of the defined types were placed in this encounter.   Return in about 6 months (around 10/09/2023) for F/U, Jasir Rother PCP low vitamin D  and otherwise as needed.  .   Total time spent:30 Minutes Time spent includes review of chart, medications, test results, and follow up plan with the patient.   [place] Controlled Substance Database was reviewed by me.  This patient was seen by Mardy Maxin, FNP-C in collaboration with Dr. Sigrid Bathe as a part of collaborative care agreement.  Aron Needles R. Maxin, MSN, FNP-C Internal medicine

## 2023-04-12 ENCOUNTER — Encounter: Payer: Self-pay | Admitting: Nurse Practitioner

## 2023-04-12 DIAGNOSIS — E559 Vitamin D deficiency, unspecified: Secondary | ICD-10-CM | POA: Insufficient documentation

## 2023-04-15 ENCOUNTER — Telehealth: Payer: Self-pay

## 2023-04-15 MED ORDER — AZITHROMYCIN 250 MG PO TABS: ORAL_TABLET | ORAL | 0 refills | Status: AC

## 2023-04-15 NOTE — Telephone Encounter (Signed)
 Pt advised we sent med

## 2023-04-19 ENCOUNTER — Other Ambulatory Visit: Payer: Self-pay | Admitting: Internal Medicine

## 2023-04-22 ENCOUNTER — Other Ambulatory Visit (INDEPENDENT_AMBULATORY_CARE_PROVIDER_SITE_OTHER): Payer: Self-pay | Admitting: Nurse Practitioner

## 2023-04-22 DIAGNOSIS — I8311 Varicose veins of right lower extremity with inflammation: Secondary | ICD-10-CM

## 2023-04-23 ENCOUNTER — Ambulatory Visit
Admission: RE | Admit: 2023-04-23 | Discharge: 2023-04-23 | Disposition: A | Discharge status: Home / Self Care | Payer: Medicare Other | Attending: Nurse Practitioner | Admitting: Nurse Practitioner

## 2023-04-23 ENCOUNTER — Ambulatory Visit
Admission: RE | Admit: 2023-04-23 | Discharge: 2023-04-23 | Disposition: A | Discharge status: Home / Self Care | Payer: Medicare Other | Source: Ambulatory Visit | Attending: Nurse Practitioner

## 2023-04-23 ENCOUNTER — Other Ambulatory Visit: Payer: Self-pay | Admitting: Nurse Practitioner

## 2023-04-23 DIAGNOSIS — R062 Wheezing: Secondary | ICD-10-CM

## 2023-04-23 DIAGNOSIS — R051 Acute cough: Secondary | ICD-10-CM | POA: Diagnosis present

## 2023-04-23 MED ORDER — LEVOFLOXACIN 500 MG PO TABS: 500.0000 mg | ORAL_TABLET | Freq: Every day | ORAL | 0 refills | Status: AC

## 2023-04-24 ENCOUNTER — Telehealth: Payer: Self-pay

## 2023-04-24 NOTE — Telephone Encounter (Signed)
-----   Message from [facility] sent at 04/24/2023  8:39 AM EST ----- No pneumonia seen on chest xray but continue the antibiotic until gone.

## 2023-04-24 NOTE — Progress Notes (Signed)
No pneumonia seen on chest xray but continue the antibiotic until gone.

## 2023-04-24 NOTE — Telephone Encounter (Signed)
Patient notified

## 2023-04-25 ENCOUNTER — Encounter (INDEPENDENT_AMBULATORY_CARE_PROVIDER_SITE_OTHER): Payer: Self-pay | Admitting: Nurse Practitioner

## 2023-04-25 ENCOUNTER — Ambulatory Visit (INDEPENDENT_AMBULATORY_CARE_PROVIDER_SITE_OTHER): Payer: Medicare Other | Admitting: Nurse Practitioner

## 2023-04-25 ENCOUNTER — Encounter: Payer: Self-pay | Admitting: Nurse Practitioner

## 2023-04-25 ENCOUNTER — Ambulatory Visit (INDEPENDENT_AMBULATORY_CARE_PROVIDER_SITE_OTHER): Payer: Medicare Other

## 2023-04-25 VITALS — BP 93/62 | HR 79 | Resp 18 | Ht 66.0 in | Wt 143.0 lb

## 2023-04-25 VITALS — BP 128/72 | HR 74 | Temp 98.3°F | Resp 16 | Ht 66.0 in | Wt 146.8 lb

## 2023-04-25 DIAGNOSIS — I8311 Varicose veins of right lower extremity with inflammation: Secondary | ICD-10-CM

## 2023-04-25 DIAGNOSIS — I10 Essential (primary) hypertension: Secondary | ICD-10-CM | POA: Diagnosis not present

## 2023-04-25 DIAGNOSIS — I8312 Varicose veins of left lower extremity with inflammation: Secondary | ICD-10-CM | POA: Diagnosis not present

## 2023-04-25 DIAGNOSIS — I890 Lymphedema, not elsewhere classified: Secondary | ICD-10-CM

## 2023-04-25 DIAGNOSIS — J0110 Acute frontal sinusitis, unspecified: Secondary | ICD-10-CM | POA: Diagnosis not present

## 2023-04-25 NOTE — Progress Notes (Signed)
[facility] [address] [place], [place] [address]  Internal MEDICINE  Office Visit Note  Patient Name: Sonya Park  [id number]  [id number]  Date of Service: 04/26/2023  Chief Complaint  Patient presents with   Acute Visit    No better chest congestion      HPI Sonya Park presents for an acute sick visit for sinusitis  --onset was several days ago after seh received the RSV vaccination on 04/14/23 -- was given a zpak but symptoms did not improve very much Then was started on levofloxacin and reports that symptoms are improving as of today.  Chest xray was negative for pneumonia.        Current Medication:  Outpatient Encounter Medications as of 04/25/2023  Medication Sig   aspirin 81 MG tablet Take 81 mg by mouth daily. Once a week (every Sunday)   b complex vitamins capsule Take 1 capsule by mouth daily.   Biotin (BIOTIN EXTRA STRENGTH) 10 MG CAPS Take by mouth.   calcium citrate-vitamin D (CITRACAL+D) 315-200 MG-UNIT tablet Take by mouth.   Cholecalciferol (VITAMIN D) 50 MCG (2000 UT) CAPS Take by mouth.   Cholecalciferol 125 MCG (5000 UT) TABS Take by mouth.   Copper Gluconate 2 MG CAPS Take 4 mg by mouth daily.   Cyanocobalamin (B-12) 3000 MCG CAPS Take by mouth.   FEROSUL 325 (65 Fe) MG tablet TAKE 1 TABLET DAILY   levofloxacin (LEVAQUIN) 500 MG tablet Take 1 tablet (500 mg total) by mouth daily for 7 days.   Melatonin 5 MG TABS Take 2.5 mg by mouth. As needed   Multiple Vitamins-Minerals (BARIATRIC MULTIVITAMINS/IRON PO) Take 2 capsules by mouth every morning. After breakfast.   pantoprazole (PROTONIX) 40 MG tablet Take 1 tablet (40 mg total) by mouth 2 (two) times daily before a meal.   PREVIDENT 5000 SENSITIVE 1.1-5 % GEL Place 1 Application onto teeth as directed.   spironolactone (ALDACTONE) 25 MG tablet Take 1 tablet (25 mg total) by mouth daily. (Patient taking differently: Take 25 mg by mouth every other day.)   thiamine 100 MG tablet Take 1  tablet (100 mg total) daily by mouth.   No facility-administered encounter medications on file as of 04/25/2023.      Medical History: Past Medical History:  Diagnosis Date   Arthritis    Back pain    Insomnia    Medical history non-contributory    Osteoporosis      Vital Signs: BP 128/72   Pulse 74   Temp 98.3 F (36.8 C)   Resp 16   Ht 5\' 6"  (1.676 m)   Wt 146 lb 12.8 oz (66.6 kg)   SpO2 97%   BMI 23.69 kg/m    Review of Systems  Constitutional:  Positive for fatigue.  HENT:  Positive for congestion, postnasal drip, rhinorrhea and sore throat.   Respiratory:  Positive for cough, chest tightness and shortness of breath. Negative for wheezing.   Cardiovascular: Negative.  Negative for chest pain and palpitations.    Physical Exam Vitals reviewed.  Constitutional:      General: She is not in acute distress.    Appearance: Normal appearance. She is normal weight. She is ill-appearing.  HENT:     Head: Normocephalic and atraumatic.  Eyes:     Pupils: Pupils are equal, round, and reactive to light.  Cardiovascular:     Rate and Rhythm: Normal rate and regular rhythm.     Heart sounds: Normal heart sounds. No  murmur heard. Pulmonary:     Effort: Pulmonary effort is normal. No respiratory distress.     Breath sounds: Normal breath sounds.  Neurological:     Mental Status: She is alert and oriented to person, place, and time.  Psychiatric:        Mood and Affect: Mood normal.        Behavior: Behavior normal.       Assessment/Plan: 1. Acute non-recurrent frontal sinusitis (Primary) Continue levofloxacin as prescribed. Follow up in no improvement or worsening of symptoms.    General Counseling: [name] verbalizes understanding of the findings of todays visit and agrees with plan of treatment. I have discussed any further diagnostic evaluation that may be needed or ordered today. We also reviewed her medications today. she has been encouraged to call the  office with any questions or concerns that should arise related to todays visit.    Counseling:    No orders of the defined types were placed in this encounter.   No orders of the defined types were placed in this encounter.   Return if symptoms worsen or fail to improve.  [place] Controlled Substance Database was reviewed by me for overdose risk score (ORS)  Time spent:20 Minutes Time spent with patient included reviewing progress notes, labs, imaging studies, and discussing plan for follow up.   This patient was seen by Sallyanne Kuster, FNP-C in collaboration with Dr. Beverely Risen as a part of collaborative care agreement.  Lynnita Somma R. Tedd Sias, MSN, FNP-C Internal Medicine

## 2023-04-27 NOTE — Progress Notes (Signed)
Subjective:    Patient ID: Sonya Park, female    DOB: Oct 17, 1940, 83 y.o.   MRN: [id number] Chief Complaint  Patient presents with   Follow-up    F/u Bilateral Venous Reflux    The patient is an 83 year old female who presents today for evaluation of bilateral varicose veins.  She notes that she has had veins present in her legs for years and has recently noted that her legs become very discolored at the end of the day.  They feel weak and tired after she has been up and on her legs through the day.  She notes the right lower extremity is larger than the left but she also had a femur repair and has a rod in this extremity.  This enlargement happen after the repair.  She notes that it is permanently enlarged but no significant swelling of the lower part of the leg.  She additionally has the reasonable concern for possible bleeding of her varicosities.  Currently there are no open wounds or ulcerations.  There is currently no weeping.  Today no evidence of DVT or superficial Vitas noted bilaterally.  No evidence of deep venous insufficiency noted bilaterally.  In the right lower extremity she has a trivial amount of reflux in the saphenous vein at the saphenofemoral junction.  Left lower extremity has reflux in the great saphenous vein at the mid and distal thigh.    Review of Systems  Cardiovascular:  Positive for leg swelling.  Musculoskeletal:  Positive for arthralgias.  All other systems reviewed and are negative.      Objective:   Physical Exam Vitals reviewed.  HENT:     Head: Normocephalic.  Cardiovascular:     Rate and Rhythm: Normal rate.     Pulses: Normal pulses.  Pulmonary:     Effort: Pulmonary effort is normal.  Musculoskeletal:     Right lower leg: Edema present.  Skin:    General: Skin is warm and dry.  Neurological:     Mental Status: She is alert and oriented to person, place, and time.  Psychiatric:        Mood and Affect: Mood normal.        Behavior:  Behavior normal.        Thought Content: Thought content normal.        Judgment: Judgment normal.     BP 93/62   Pulse 79   Resp 18   Ht 5\' 6"  (1.676 m)   Wt 143 lb (64.9 kg)   BMI 23.08 kg/m   Past Medical History:  Diagnosis Date   Arthritis    Back pain    Insomnia    Medical history non-contributory    Osteoporosis     Social History   Socioeconomic History   Marital status: Married    Spouse name: Medha Siroky   Number of children: 1   Years of education: 12   Highest education level: Some college, no degree  Occupational History   Occupation: Retired  Tobacco Use   Smoking status: Former    Current packs/day: 0.00    Average packs/day: 2.0 packs/day for 29.0 years (58.0 ttl pk-yrs)    Types: Cigarettes    Start date: 11/29/1965    Quit date: 11/30/1994    Years since quitting: 28.4   Smokeless tobacco: Never  Vaping Use   Vaping status: Never Used  Substance and Sexual Activity   Alcohol use: Not Currently    Alcohol/week: 21.0 -  35.0 standard drinks of alcohol    Types: 21 - 35 Glasses of wine per week    Comment: 3-5 glasses of wine/ day   Drug use: No   Sexual activity: Not Currently  Other Topics Concern   Not on file  Social History Narrative   Not on file   Social Drivers of Health   Financial Resource Strain: Low Risk  (07/24/2022)   Received from [facility], [facility]   Overall Financial Resource Strain (CARDIA)    Difficulty of Paying Living Expenses: Not hard at all  Food Insecurity: No Food Insecurity (07/24/2022)   Received from [facility], [facility]   Hunger Vital Sign    Worried About Running Out of Food in the Last Year: Never true    Ran Out of Food in the Last Year: Never true  Transportation Needs: No Transportation Needs (07/24/2022)   Received from [facility], [facility]   [facility] - Transportation    In the past  12 months, has lack of transportation kept you from medical appointments or from getting medications?: No    Lack of Transportation (Non-Medical): No  Physical Activity: Inactive (03/14/2022)   Exercise Vital Sign    Days of Exercise per Week: 0 days    Minutes of Exercise per Session: 0 min  Stress: Stress Concern Present (03/14/2022)   [employer] of Occupational Health - Occupational Stress Questionnaire    Feeling of Stress : To some extent  Social Connections: Moderately Isolated (03/14/2022)   Social Connection and Isolation Panel [NHANES]    Frequency of Communication with Friends and Family: More than three times a week    Frequency of Social Gatherings with Friends and Family: Once a week    Attends Religious Services: Never    Database administrator or Organizations: No    Attends Banker Meetings: Never    Marital Status: Married  Catering manager Violence: Not At Risk (03/14/2022)   Humiliation, Afraid, Rape, and Kick questionnaire    Fear of Current or Ex-Partner: No    Emotionally Abused: No    Physically Abused: No    Sexually Abused: No    Past Surgical History:  Procedure Laterality Date   ABDOMINAL HYSTERECTOMY     BREAST LUMPECTOMY Right    COLONOSCOPY WITH PROPOFOL N/A 07/18/2015   Procedure: COLONOSCOPY WITH PROPOFOL;  Surgeon: Midge Minium, MD;  Location: [facility] ENDOSCOPY;  Service: Endoscopy;  Laterality: N/A;   COSMETIC SURGERY     GASTRIC BYPASS     X2   HEMORROIDECTOMY     KNEE ARTHROPLASTY Right 12/13/2015   Procedure: COMPUTER ASSISTED TOTAL KNEE ARTHROPLASTY;  Surgeon: Donato Heinz, MD;  Location: [facility] ORS;  Service: Orthopedics;  Laterality: Right;   KNEE ARTHROSCOPY     REPLACEMENT TOTAL KNEE Left    SHOULDER SURGERY Right     Family History  Problem Relation Age of Onset   Liver disease Mother    Heart attack Father    Cancer Father    CAD Sister     Allergies  Allergen Reactions   Chlorpheniramine    Penicillins  Swelling    Has patient had a PCN reaction causing immediate rash, facial/tongue/throat swelling, SOB or lightheadedness with hypotension: Yes Has patient had a PCN reaction causing severe rash involving mucus membranes or skin necrosis: No Has patient had a PCN reaction that required hospitalization: No Has patient  had a PCN reaction occurring within the last 10 years: No If all of the above answers are "NO", then may proceed with Cephalosporin use. Other reaction(s): SWELLING Other reaction(s): other   Other Itching and Other (See Comments)    "mycin" doesn't know which one "mycin" doesn't know which one       Latest Ref Rng & Units 03/14/2023    2:39 PM 04/16/2022   12:32 PM 02/03/2022    7:01 PM  CBC  WBC 4.0 - 10.5 K/uL 4.2  5.9  10.1   Hemoglobin 12.0 - 15.0 g/dL 16.1  09.6  04.5   Hematocrit 36.0 - 46.0 % 39.0  38.8  36.4   Platelets 150 - 400 K/uL 239  326  240       CMP     Component Value Date/Time   NA 141 03/14/2023 1439   K 4.0 03/14/2023 1439   CL 108 03/14/2023 1439   CO2 26 03/14/2023 1439   GLUCOSE 209 (H) 03/14/2023 1439   BUN 16 03/14/2023 1439   CREATININE 0.85 03/14/2023 1439   CREATININE 0.68 02/15/2020 1528   CALCIUM 8.4 (L) 03/14/2023 1439   PROT 6.0 (L) 03/14/2023 1439   ALBUMIN 3.6 03/14/2023 1439   AST 24 03/14/2023 1439   ALT 19 03/14/2023 1439   ALKPHOS 46 03/14/2023 1439   BILITOT 0.7 03/14/2023 1439   GFRNONAA >60 03/14/2023 1439   GFRNONAA 84 02/15/2020 1528     No results found.     Assessment & Plan:   1. 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  However it is not extremely significant.  Her largest concern she noted today was of the possibility for having blood clots.  She has none today.  We discussed the typical causes of blood clots and have the presence of venous insufficiency does not lead to  DVT although it can cause superficial phlebitis to occur.  We also discussed possible sclerotherapy for treatment of her spider varicosities.  This time patient does not wish to move forward with treatment.  She is advised to utilize medical grade compression and will follow-up with [place] on an as-needed basis.  2. Essential hypertension Continue antihypertensive medications as already ordered, these medications have been reviewed and there are no changes at this time.  3. Lymphedema Currently the patient does have a larger right lower extremity but this is more so from her rod versus lymphedema.  I suspect that that leg will always be swelling given her history of a rod.  As far as other swelling throughout the day of recommended that she utilize medical grade compression elevation and activity as tolerable.   Current Outpatient Medications on File Prior to Visit  Medication Sig Dispense Refill   aspirin 81 MG tablet Take 81 mg by mouth daily. Once a week (every Sunday)     b complex vitamins capsule Take 1 capsule by mouth daily.     Biotin (BIOTIN EXTRA STRENGTH) 10 MG CAPS Take by mouth.     calcium citrate-vitamin D (CITRACAL+D) 315-200 MG-UNIT tablet Take by mouth.     celecoxib (CELEBREX) 200 MG capsule Take by mouth.     Cholecalciferol (VITAMIN D) 50 MCG (2000 UT) CAPS Take by mouth.     Cholecalciferol 125 MCG (5000 UT) TABS Take by mouth.     Copper Gluconate 2 MG CAPS Take 4 mg by mouth daily.  60 capsule 3   Cyanocobalamin (B-12) 3000 MCG CAPS Take by mouth.     FEROSUL 325 (65 Fe) MG tablet TAKE 1 TABLET DAILY 90 tablet 3   levofloxacin (LEVAQUIN) 500 MG tablet Take 1 tablet (500 mg total) by mouth daily for 7 days. 7 tablet 0   Melatonin 5 MG TABS Take 2.5 mg by mouth. As needed     Multiple Vitamins-Minerals (BARIATRIC MULTIVITAMINS/IRON PO) Take 2 capsules by mouth every morning. After breakfast.     pantoprazole (PROTONIX) 40 MG tablet Take 1 tablet (40 mg total) by mouth 2  (two) times daily before a meal. 180 tablet 1   PREVIDENT 5000 SENSITIVE 1.1-5 % GEL Place 1 Application onto teeth as directed.     spironolactone (ALDACTONE) 25 MG tablet Take 1 tablet (25 mg total) by mouth daily. (Patient taking differently: Take 25 mg by mouth every other day.) 90 tablet 3   thiamine 100 MG tablet Take 1 tablet (100 mg total) daily by mouth.     No current facility-administered medications on file prior to visit.    There are no Patient Instructions on file for this visit. No follow-ups on file.   Georgiana Spinner, NP

## 2023-06-02 ENCOUNTER — Other Ambulatory Visit: Payer: Self-pay

## 2023-06-02 ENCOUNTER — Emergency Department
Admission: EM | Admit: 2023-06-02 | Discharge: 2023-06-02 | Disposition: A | Discharge status: Home / Self Care | Payer: Medicare Other | Attending: Emergency Medicine | Admitting: Emergency Medicine

## 2023-06-02 ENCOUNTER — Emergency Department: Payer: Medicare Other

## 2023-06-02 DIAGNOSIS — Z96652 Presence of left artificial knee joint: Secondary | ICD-10-CM | POA: Insufficient documentation

## 2023-06-02 DIAGNOSIS — M25562 Pain in left knee: Secondary | ICD-10-CM | POA: Insufficient documentation

## 2023-06-02 NOTE — ED Provider Notes (Signed)
 [facility] Provider Note    Event Date/Time   First MD Initiated Contact with Patient 06/02/23 1615     (approximate)   History   Knee Pain   HPI  Sonya Park is a 83 y.o. female with PMH of arthritis, osteoporosis and left knee replacement among other things listed in her chart who present for evaluation of left knee pain.  Patient states she was coming down some steps and felt her knee pop.  After this she had pain to her left knee.  She has been able to walk as long as she keeps her knee in extension.  Patient has had a knee replacement and is concerned that she has broken off a piece of bone.      Physical Exam   Triage Vital Signs: ED Triage Vitals  Encounter Vitals Group     BP 06/02/23 1554 136/76     Systolic BP Percentile --      Diastolic BP Percentile --      Pulse Rate 06/02/23 1554 85     Resp 06/02/23 1554 18     Temp 06/02/23 1554 98 F (36.7 C)     Temp src --      SpO2 06/02/23 1554 100 %     Weight --      Height --      Head Circumference --      Peak Flow --      Pain Score 06/02/23 1555 10     Pain Loc --      Pain Education --      Exclude from Growth Chart --     Most recent vital signs: Vitals:   06/02/23 1554  BP: 136/76  Pulse: 85  Resp: 18  Temp: 98 F (36.7 C)  SpO2: 100%   General: Awake, no distress.  CV:  Good peripheral perfusion.  Resp:  Normal effort.  Abd:  No distention.  Other:  Left knee mildly swollen when compared to the right.  Tenderness to palpation surrounding the patella.  No overlying skin changes, bruising or redness.  It is slightly warm when compared to right side.  Patient able to independently perform ROM.  No ligament laxity felt in varus and valgus stress.  Lachman's was negative.  Unable to assess McMurray's, anterior and posterior drawer due to patient's pain level.   ED Results / Procedures / Treatments   Labs (all labs ordered are listed, but only abnormal results  are displayed) Labs Reviewed - No data to display  RADIOLOGY  Left knee x-ray obtained, interpreted the images as well as reviewed the radiologist report which is negative for any fractures at this time but does show hardware from the knee replacement remains intact and properly in place.   PROCEDURES:  Critical Care performed: No  Procedures   MEDICATIONS ORDERED IN ED: Medications - No data to display   IMPRESSION / MDM / ASSESSMENT AND PLAN / ED COURSE  I reviewed the triage vital signs and the nursing notes.                             83 year old female presents for evaluation of left knee pain.  Vital signs are stable patient NAD on exam.  Differential diagnosis includes, but is not limited to, fracture, dislocation, ligament injury, meniscus injury, joint effusion, septic joint.  Patient's presentation is most consistent with acute complicated illness / injury  requiring diagnostic workup.  Left knee x-ray is negative.  Physical exam is reassuring.  Patient does have some tenderness to palpation but is able to independently perform ROM.  Patient is closely followed by orthopedics so I recommended she follow-up with them.  She takes anti-inflammatories regularly.  I explained that this medication will also treat her knee pain.  Given that she is most comfortable walking with her leg extended, I will give her a knee immobilizer to wear.  I recommended that she wear it anytime she is up walking around, she can weight-bear as tolerated.  We also discussed RICE therapy.  She voiced understanding, all questions were answered and she was stable at discharge.     FINAL CLINICAL IMPRESSION(S) / ED DIAGNOSES   Final diagnoses:  Acute pain of left knee     Rx / DC Orders   ED Discharge Orders     None        Note:  This document was prepared using Dragon voice recognition software and may include unintentional dictation errors.   Cameron Ali, PA-C 06/02/23  Warrick Parisian, MD 06/02/23 [phone]

## 2023-06-02 NOTE — Discharge Instructions (Addendum)
 The xray of your knee was negative today for fractures. It showed that your knee replacement hardware is properly in place. Please continue to take the previously prescribed Celebrex for your pain. Follow up with Micah Noel in a week or whenever your next appointment is.   Wear the knee immobilizer any time you are up walking around. You can weight bear as tolerated. I also encourage you to ice and elevate the knee as well.   It was a pleasure to care for you today! Please return to the ED with any worsening symptoms.

## 2023-06-02 NOTE — ED Triage Notes (Signed)
 Pt comes with left knee pain. Pt states she felt her knee pop about 3 hours ago. Pt was coming down some steps. Pt able to bear little weight and keep it straight.

## 2023-06-13 ENCOUNTER — Emergency Department

## 2023-06-13 ENCOUNTER — Observation Stay
Admission: EM | Admit: 2023-06-13 | Discharge: 2023-06-19 | Disposition: A | Discharge status: Home / Self Care | Attending: Student | Admitting: Student

## 2023-06-13 ENCOUNTER — Other Ambulatory Visit: Payer: Self-pay

## 2023-06-13 DIAGNOSIS — Z8659 Personal history of other mental and behavioral disorders: Secondary | ICD-10-CM | POA: Insufficient documentation

## 2023-06-13 DIAGNOSIS — S62102A Fracture of unspecified carpal bone, left wrist, initial encounter for closed fracture: Secondary | ICD-10-CM | POA: Diagnosis present

## 2023-06-13 DIAGNOSIS — R2689 Other abnormalities of gait and mobility: Secondary | ICD-10-CM | POA: Insufficient documentation

## 2023-06-13 DIAGNOSIS — F1010 Alcohol abuse, uncomplicated: Secondary | ICD-10-CM

## 2023-06-13 DIAGNOSIS — I5032 Chronic diastolic (congestive) heart failure: Secondary | ICD-10-CM | POA: Diagnosis present

## 2023-06-13 DIAGNOSIS — D509 Iron deficiency anemia, unspecified: Secondary | ICD-10-CM | POA: Diagnosis not present

## 2023-06-13 DIAGNOSIS — Z87891 Personal history of nicotine dependence: Secondary | ICD-10-CM | POA: Diagnosis not present

## 2023-06-13 DIAGNOSIS — Z79899 Other long term (current) drug therapy: Secondary | ICD-10-CM | POA: Insufficient documentation

## 2023-06-13 DIAGNOSIS — S3282XA Multiple fractures of pelvis without disruption of pelvic ring, initial encounter for closed fracture: Secondary | ICD-10-CM | POA: Diagnosis present

## 2023-06-13 DIAGNOSIS — I110 Hypertensive heart disease with heart failure: Secondary | ICD-10-CM | POA: Diagnosis not present

## 2023-06-13 DIAGNOSIS — S52502A Unspecified fracture of the lower end of left radius, initial encounter for closed fracture: Secondary | ICD-10-CM

## 2023-06-13 DIAGNOSIS — W19XXXA Unspecified fall, initial encounter: Secondary | ICD-10-CM | POA: Diagnosis not present

## 2023-06-13 DIAGNOSIS — W08XXXA Fall from other furniture, initial encounter: Secondary | ICD-10-CM | POA: Insufficient documentation

## 2023-06-13 DIAGNOSIS — S6292XA Unspecified fracture of left wrist and hand, initial encounter for closed fracture: Secondary | ICD-10-CM | POA: Insufficient documentation

## 2023-06-13 DIAGNOSIS — Z9181 History of falling: Secondary | ICD-10-CM | POA: Insufficient documentation

## 2023-06-13 DIAGNOSIS — S329XXA Fracture of unspecified parts of lumbosacral spine and pelvis, initial encounter for closed fracture: Secondary | ICD-10-CM | POA: Diagnosis present

## 2023-06-13 DIAGNOSIS — Z7901 Long term (current) use of anticoagulants: Secondary | ICD-10-CM | POA: Diagnosis not present

## 2023-06-13 DIAGNOSIS — Y92009 Unspecified place in unspecified non-institutional (private) residence as the place of occurrence of the external cause: Secondary | ICD-10-CM

## 2023-06-13 DIAGNOSIS — S32409A Unspecified fracture of unspecified acetabulum, initial encounter for closed fracture: Secondary | ICD-10-CM | POA: Diagnosis not present

## 2023-06-13 DIAGNOSIS — E559 Vitamin D deficiency, unspecified: Secondary | ICD-10-CM | POA: Diagnosis not present

## 2023-06-13 DIAGNOSIS — I10 Essential (primary) hypertension: Secondary | ICD-10-CM | POA: Diagnosis present

## 2023-06-13 LAB — CBC
HCT: 35.8 % — ABNORMAL LOW (ref 36.0–46.0)
Hemoglobin: 11.8 g/dL — ABNORMAL LOW (ref 12.0–15.0)
MCH: 31.2 pg (ref 26.0–34.0)
MCHC: 33 g/dL (ref 30.0–36.0)
MCV: 94.7 fL (ref 80.0–100.0)
Platelets: 216 10*3/uL (ref 150–400)
RBC: 3.78 MIL/uL — ABNORMAL LOW (ref 3.87–5.11)
RDW: 14.3 % (ref 11.5–15.5)
WBC: 8.4 10*3/uL (ref 4.0–10.5)
nRBC: 0 % (ref 0.0–0.2)

## 2023-06-13 LAB — BASIC METABOLIC PANEL
Anion gap: 10 (ref 5–15)
BUN: 20 mg/dL (ref 8–23)
CO2: 25 mmol/L (ref 22–32)
Calcium: 8.3 mg/dL — ABNORMAL LOW (ref 8.9–10.3)
Chloride: 105 mmol/L (ref 98–111)
Creatinine, Ser: 0.68 mg/dL (ref 0.44–1.00)
GFR, Estimated: >60 mL/min (ref >60)
Glucose, Bld: 153 mg/dL — ABNORMAL HIGH (ref 70–99)
Potassium: 3.5 mmol/L (ref 3.5–5.1)
Sodium: 140 mmol/L (ref 135–145)

## 2023-06-13 LAB — PROTIME-INR
INR: 1 (ref 0.8–1.2)
Prothrombin Time: 13.3 s (ref 11.4–15.2)

## 2023-06-13 LAB — TYPE AND SCREEN
ABO/RH(D): A POS
Antibody Screen: NEGATIVE

## 2023-06-13 LAB — BRAIN NATRIURETIC PEPTIDE: B Natriuretic Peptide: 153.7 pg/mL — ABNORMAL HIGH (ref 0.0–100.0)

## 2023-06-13 LAB — APTT: aPTT: 30 s (ref 24–36)

## 2023-06-13 MED ORDER — FERROUS SULFATE 325 (65 FE) MG PO TABS
325.0000 mg | ORAL_TABLET | Freq: Every day | ORAL | Status: DC
  Administered 2023-06-14 – 2023-06-19 (×6): 325 mg via ORAL
  Filled 2023-06-13 (×6): qty 1

## 2023-06-13 MED ORDER — OXYCODONE-ACETAMINOPHEN 5-325 MG PO TABS: 1.0000 | ORAL_TABLET | ORAL | Status: DC | PRN

## 2023-06-13 MED ORDER — ASPIRIN 81 MG PO TBEC
81.0000 mg | DELAYED_RELEASE_TABLET | Freq: Every day | ORAL | Status: DC
  Administered 2023-06-15: 81 mg via ORAL
  Filled 2023-06-13: qty 1

## 2023-06-13 MED ORDER — ACETAMINOPHEN 325 MG PO TABS: 650.0000 mg | ORAL_TABLET | Freq: Four times a day (QID) | ORAL | Status: DC | PRN

## 2023-06-13 MED ORDER — OXYCODONE HCL 5 MG PO TABS
5.0000 mg | ORAL_TABLET | ORAL | Status: AC
  Administered 2023-06-13: 5 mg via ORAL
  Filled 2023-06-13: qty 1

## 2023-06-13 MED ORDER — FENTANYL CITRATE PF 50 MCG/ML IJ SOSY
25.0000 ug | PREFILLED_SYRINGE | Freq: Once | INTRAMUSCULAR | Status: AC
  Administered 2023-06-13: 25 ug via INTRAVENOUS
  Filled 2023-06-13: qty 1

## 2023-06-13 MED ORDER — ONDANSETRON 4 MG PO TBDP
4.0000 mg | ORAL_TABLET | Freq: Once | ORAL | Status: AC
  Administered 2023-06-13: 4 mg via ORAL
  Filled 2023-06-13: qty 1

## 2023-06-13 MED ORDER — SPIRONOLACTONE 25 MG PO TABS
25.0000 mg | ORAL_TABLET | ORAL | Status: DC
  Administered 2023-06-14 – 2023-06-18 (×3): 25 mg via ORAL
  Filled 2023-06-13 (×3): qty 1

## 2023-06-13 MED ORDER — MORPHINE SULFATE (PF) 2 MG/ML IV SOLN
2.0000 mg | INTRAVENOUS | Status: DC | PRN
  Administered 2023-06-13 – 2023-06-14 (×2): 2 mg via INTRAVENOUS
  Filled 2023-06-13 (×2): qty 1

## 2023-06-13 MED ORDER — METHOCARBAMOL 500 MG PO TABS
500.0000 mg | ORAL_TABLET | Freq: Three times a day (TID) | ORAL | Status: DC | PRN
  Administered 2023-06-14 – 2023-06-16 (×2): 500 mg via ORAL
  Filled 2023-06-13 (×2): qty 1

## 2023-06-13 MED ORDER — LIDOCAINE 5 % EX PTCH
1.0000 | MEDICATED_PATCH | CUTANEOUS | Status: DC
  Administered 2023-06-13 – 2023-06-18 (×6): 1 via TRANSDERMAL
  Filled 2023-06-13 (×6): qty 1

## 2023-06-13 MED ORDER — PANTOPRAZOLE SODIUM 40 MG PO TBEC
40.0000 mg | DELAYED_RELEASE_TABLET | Freq: Two times a day (BID) | ORAL | Status: DC
  Administered 2023-06-14 – 2023-06-19 (×11): 40 mg via ORAL
  Filled 2023-06-13 (×11): qty 1

## 2023-06-13 MED ORDER — ONDANSETRON HCL 4 MG/2ML IJ SOLN
4.0000 mg | Freq: Three times a day (TID) | INTRAMUSCULAR | Status: DC | PRN
  Administered 2023-06-14: 4 mg via INTRAVENOUS
  Filled 2023-06-13: qty 2

## 2023-06-13 MED ORDER — MELATONIN 5 MG PO TABS
2.5000 mg | ORAL_TABLET | Freq: Every evening | ORAL | Status: DC | PRN
  Administered 2023-06-14 – 2023-06-18 (×6): 2.5 mg via ORAL
  Filled 2023-06-13 (×6): qty 1

## 2023-06-13 MED ORDER — HEPARIN SODIUM (PORCINE) 5000 UNIT/ML IJ SOLN
5000.0000 [IU] | Freq: Three times a day (TID) | INTRAMUSCULAR | Status: DC
  Administered 2023-06-13: 5000 [IU] via SUBCUTANEOUS
  Filled 2023-06-13: qty 1

## 2023-06-13 NOTE — ED Triage Notes (Signed)
 Pt in via [employer]. -Pt brought in for a fall at home. Pt states she slipped while out in the yard today. Pt denies hitting head.  C/o groin/hip pain to the left side and left wrist pain.  Hx of fx hip in right side.  of fenty given per EMS.  EMS Vials 150/70 86-hr 96% on RA 156 CBG

## 2023-06-13 NOTE — H&P (Signed)
 History and Physical    Sonya Park AOZ:[id number] DOB: Nov 15, 1940 DOA: 06/13/2023  Referring MD/NP/PA:   PCP: Sallyanne Kuster, NP   Patient coming from:  The patient is coming from home.     Chief Complaint: fall  HPI: Sonya Park is a 83 y.o. 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  Pt stats that she stumbled in the garage on the edge of a mattress or some chair, fell hard on her left hip around 3:30 PM.  She also injured her left wrist and left hip.  She developed bruise, pain and swelling in the left wrist.  She has pain in left hip and groin area, which is constant, sharp, severe, nonradiating, aggravated by movement. Patient strongly denies any head or neck injury.  No headache or neck pain.  She refused CT scan of head and neck. Patient does not have chest pain, cough, SOB.  No nausea, vomiting, diarrhea or abdominal pain.  No symptoms of UTI.  No fever or chills. Pt has also has lower back pain.  Data reviewed independently and ED Course: pt was found to have WBC 8.4, GFR> 60.  Temperature normal, blood pressure 115/90, heart rate 84, RR 26, oxygen saturation 100% on room air. Pt is placed in MedSurg bed for observation.  Consulted Dr. Audelia Acton of Ortho.   X-ray of left hip/pelvis: Mildly displaced fractures of the left superior and inferior pubic rami.  CT-Pelvis: 1. Mildly displaced and comminuted fracture of the left superior pubic ramus. Fracture extends to the pubic body. 2. Minimally displaced fracture of the left inferior pubic ramus.   X-ray of left  wrist: 1. Impacted fracture of the distal radial metaphysis. 2. Nondisplaced ulna styloid fracture.  X-ray of L spin:  1. Age indeterminate mild T12 inferior endplate compression fracture. 2. Chronic L3 compression fracture. 3. Scoliosis and mild degenerative  change.    EKG: I have personally reviewed.  Sinus rhythm, QTc 448, LAD, poor R progression, low voltage.   Review of Systems:   General: no fevers, chills, no body weight gain, has fatigue HEENT: no blurry vision, hearing changes or sore throat Respiratory: no dyspnea, coughing, wheezing CV: no chest pain, no palpitations GI: no nausea, vomiting, abdominal pain, diarrhea, constipation GU: no dysuria, burning on urination, increased urinary frequency, hematuria  Ext: no leg edema Neuro: no unilateral weakness, numbness, or tingling, no vision change or hearing loss. Has fall Skin: no rash, no skin tear. MSK: has pain and swelling in left wrist, pain in lower back and left hip and groin area Heme: No easy bruising.  Travel history: No recent long distant travel.   Allergy:  Allergies  Allergen Reactions   Chlorpheniramine    Penicillins Swelling    Has patient had a PCN reaction causing immediate rash, facial/tongue/throat swelling, SOB or lightheadedness with hypotension: Yes Has patient had a PCN reaction causing severe rash involving mucus membranes or skin necrosis: No Has patient had a PCN reaction that required hospitalization: No Has patient had a PCN reaction occurring within the last 10 years: No If all of the above answers are "NO", then may proceed with Cephalosporin use. Other reaction(s): SWELLING Other reaction(s): other   Other Itching and Other (See Comments)    "mycin" doesn't know which one "mycin" doesn't know which one    Past Medical History:  Diagnosis Date  Arthritis    Back pain    Insomnia    Medical history non-contributory    Osteoporosis     Past Surgical History:  Procedure Laterality Date   ABDOMINAL HYSTERECTOMY     BREAST LUMPECTOMY Right    COLONOSCOPY WITH PROPOFOL N/A 07/18/2015   Procedure: COLONOSCOPY WITH PROPOFOL;  Surgeon: Midge Minium, MD;  Location: [facility] ENDOSCOPY;  Service: Endoscopy;  Laterality: N/A;   COSMETIC SURGERY      GASTRIC BYPASS     X2   HEMORROIDECTOMY     KNEE ARTHROPLASTY Right 12/13/2015   Procedure: COMPUTER ASSISTED TOTAL KNEE ARTHROPLASTY;  Surgeon: Donato Heinz, MD;  Location: [facility] ORS;  Service: Orthopedics;  Laterality: Right;   KNEE ARTHROSCOPY     REPLACEMENT TOTAL KNEE Left    SHOULDER SURGERY Right     Social History:  reports that she quit smoking about 28 years ago. Her smoking use included cigarettes. She started smoking about 57 years ago. She has a 58 pack-year smoking history. She has never used smokeless tobacco. She reports that she does not currently use alcohol after a past usage of about 21.0 - 35.0 standard drinks of alcohol per week. She reports that she does not use drugs.  Family History:  Family History  Problem Relation Age of Onset   Liver disease Mother    Heart attack Father    Cancer Father    CAD Sister      Prior to Admission medications   Medication Sig Start Date End Date Taking? Authorizing Provider  aspirin 81 MG tablet Take 81 mg by mouth daily. Once a week (every Sunday)    [provider]  b complex vitamins capsule Take 1 capsule by mouth daily.    [provider]  Biotin (BIOTIN EXTRA STRENGTH) 10 MG CAPS Take by mouth.    [provider]  calcium citrate-vitamin D (CITRACAL+D) 315-200 MG-UNIT tablet Take by mouth.    [provider]  celecoxib (CELEBREX) 200 MG capsule Take by mouth. 04/14/23   [provider]  Cholecalciferol (VITAMIN D) 50 MCG (2000 UT) CAPS Take by mouth.    [provider]  Cholecalciferol 125 MCG (5000 UT) TABS Take by mouth.    [provider]  Copper Gluconate 2 MG CAPS Take 4 mg by mouth daily. 02/26/17   Earna Coder, MD  Cyanocobalamin (B-12) 3000 MCG CAPS Take by mouth.    [provider]  FEROSUL 325 (65 Fe) MG tablet TAKE 1 TABLET DAILY 01/01/22   Corky Downs, MD  Melatonin 5 MG TABS Take 2.5 mg by mouth. As needed    [provider]  Multiple Vitamins-Minerals (BARIATRIC MULTIVITAMINS/IRON PO) Take 2 capsules by mouth every morning. After breakfast.    [provider]  pantoprazole (PROTONIX) 40 MG tablet Take 1 tablet (40 mg total) by mouth 2 (two) times daily before a meal. 08/30/21   Midge Minium, MD  PREVIDENT 5000 SENSITIVE 1.1-5 % GEL Place 1 Application onto teeth as directed. 01/01/22   [provider]  spironolactone (ALDACTONE) 25 MG tablet Take 1 tablet (25 mg total) by mouth daily. Patient taking differently: Take 25 mg by mouth every other day. 07/26/21   Corky Downs, MD  thiamine 100 MG tablet Take 1 tablet (100 mg total) daily by mouth. 02/12/17   Ramonita Lab, MD    Physical Exam: Vitals:   06/13/23 1730 06/13/23 1945 06/13/23 2126 06/13/23 2244  BP: (!) 115/90  132/75  Pulse: 71 84 80   Resp: 19 18 16    Temp:   97.6 F (36.4 C)   TempSrc:   Oral   SpO2: 95% 100% 97%   Weight:    70 kg  Height:    5' 5.98" (1.676 m)   General: Not in acute distress HEENT:       Eyes: PERRL, EOMI, no jaundice       ENT: No discharge from the ears and nose, no pharynx injection, no tonsillar enlargement.        Neck: No JVD, no bruit, no mass felt. Heme: No neck lymph node enlargement. Cardiac: S1/S2, RRR, No murmurs, No gallops or rubs. Respiratory: No rales, wheezing, rhonchi or rubs. GI: Soft, nondistended, nontender, no rebound pain, no organomegaly, BS present. GU: No hematuria Ext: No pitting leg edema bilaterally. 1+DP/PT pulse bilaterally. Musculoskeletal:  has pain, bruise and swelling in left wrist, has tenderness in left hip and groin Skin: No rashes.  Neuro: Alert, oriented X3, cranial nerves II-XII grossly intact, moves all extremities normally. Psych: Patient is not psychotic, no suicidal or hemocidal ideation.  Labs on Admission: I have personally reviewed following labs and imaging studies  CBC: Recent Labs  Lab 06/13/23 2126  WBC 8.4  HGB 11.8*  HCT  35.8*  MCV 94.7  PLT 216   Basic Metabolic Panel: Recent Labs  Lab 06/13/23 2126  NA 140  K 3.5  CL 105  CO2 25  GLUCOSE 153*  BUN 20  CREATININE 0.68  CALCIUM 8.3*   GFR: Estimated Creatinine Clearance: 50.8 mL/min (by C-G formula based on SCr of 0.68 mg/dL). Liver Function Tests: No results for input(s): "AST", "ALT", "ALKPHOS", "BILITOT", "PROT", "ALBUMIN" in the last 168 hours. No results for input(s): "LIPASE", "AMYLASE" in the last 168 hours. No results for input(s): "AMMONIA" in the last 168 hours. Coagulation Profile: Recent Labs  Lab 06/13/23 2221  INR 1.0   Cardiac Enzymes: No results for input(s): "CKTOTAL", "CKMB", "CKMBINDEX", "TROPONINI" in the last 168 hours. BNP (last 3 results) No results for input(s): "PROBNP" in the last 8760 hours. HbA1C: No results for input(s): "HGBA1C" in the last 72 hours. CBG: No results for input(s): "GLUCAP" in the last 168 hours. Lipid Profile: No results for input(s): "CHOL", "HDL", "LDLCALC", "TRIG", "CHOLHDL", "LDLDIRECT" in the last 72 hours. Thyroid Function Tests: No results for input(s): "TSH", "T4TOTAL", "FREET4", "T3FREE", "THYROIDAB" in the last 72 hours. Anemia Panel: No results for input(s): "VITAMINB12", "FOLATE", "FERRITIN", "TIBC", "IRON", "RETICCTPCT" in the last 72 hours. Urine analysis:    Component Value Date/Time   COLORURINE YELLOW (A) 02/09/2017 2249   APPEARANCEUR CLEAR (A) 02/09/2017 2249   LABSPEC 1.011 02/09/2017 2249   PHURINE 9.0 (H) 02/09/2017 2249   GLUCOSEU NEGATIVE 02/09/2017 2249   HGBUR SMALL (A) 02/09/2017 2249   BILIRUBINUR neg 07/27/2021 1302   KETONESUR NEGATIVE 02/09/2017 2249   PROTEINUR Negative 07/27/2021 1302   PROTEINUR NEGATIVE 02/09/2017 2249   UROBILINOGEN negative (A) 07/27/2021 1302   NITRITE neg 07/27/2021 1302   NITRITE NEGATIVE 02/09/2017 2249   LEUKOCYTESUR Negative 07/27/2021 1302   Sepsis Labs: @LABRCNTIP (procalcitonin:4,lacticidven:4) )No results found  for this or any previous visit (from the past 240 hours).   Radiological Exams on Admission:   Assessment/Plan Principal Problem:   Closed pelvic fracture [facility]) Active Problems:   Wrist fracture, closed, left, initial encounter   Fall at home, initial encounter   HTN (hypertension)   Chronic diastolic CHF (congestive heart failure) (HCC)  Iron deficiency anemia   Assessment and Plan:  Closed pelvic fracture (HCC) and wrist fracture, closed, left, initial encounter: pt has mildly displaced and comminuted fracture of the left superior pubic ramus and minimally displaced fracture of the left inferior pubic ramus. Also has impacted fracture of the distal radial metaphysis and nondisplaced ulna styloid fracture.  Consulted Dr. Audelia Acton of ortho.   - will place in med-surg bed for obs - Pain control: prn morphine, percocet and tyleno - When necessary Zofran for nausea - Robaxin for muscle spasm - Lidoderm patch for pain - type and cross - INR/PTT - per Dr. Audelia Acton, pt may weight bear as tolerated on the lower extremities and non weight bearing in sling and splint for the wrist  Fall at home, initial encounter - PT/OT  - Fall precaution  HTN (hypertension) -IV hydralazine as needed -Spironolactone  Chronic diastolic CHF (congestive heart failure) (HCC): 2D echo on 01/18/2017 showed EF of 45-50%.  Patient does not have leg edema DVT.  No shortness of breath.  CHF seem to be compensated.  BNP 153 -Continue spironolactone  Iron deficiency anemia: Hemoglobin 11.8 -Continue iron supplement     DVT ppx: SCD  Code Status: Full code   Family Communication:   Yes, patient's husband at bed side.      Disposition Plan:  Anticipate discharge back to previous environment  Consults called: Consulted Dr. Audelia Acton of Ortho.  Admission status and Level of care: Med-Surg:    for obs as inpt        Dispo: The patient is from: Home              Anticipated d/c is to: Home               Anticipated d/c date is: 1 day              Patient currently is not medically stable to d/c.    Severity of Illness:  The appropriate patient status for this patient is OBSERVATION. Observation status is judged to be reasonable and necessary in order to provide the required intensity of service to ensure the patient's safety. The patient's presenting symptoms, physical exam findings, and initial radiographic and laboratory data in the context of their medical condition is felt to place them at decreased risk for further clinical deterioration. Furthermore, it is anticipated that the patient will be medically stable for discharge from the hospital within 2 midnights of admission.        Date of Service 06/13/2023    Lorretta Harp [facility]   If 7PM-7AM, please contact night-coverage [email or web address] 06/13/2023, 11:08 PM

## 2023-06-13 NOTE — Plan of Care (Signed)

## 2023-06-13 NOTE — ED Provider Notes (Signed)
 [facility] Provider Note    Event Date/Time   First MD Initiated Contact with Patient 06/13/23 1655     (approximate)   History   Fall   HPI  Sonya Park is a 83 y.o. female has a history of previous orthopedic surgery primarily involving the right shoulder.  Today she was at home she was assisting her husband.  She stumbled in the garage on the edge of a mattress or some chair.  In so doing she reports that she felt hard on her left hip.  She is having pain in her left hip joint on the inner area.  No numbness or weakness in the leg.  She also reports that about a week ago she had an issue with her left knee that is been improving.  She denies any other injury except she had some soreness and a little bit of swelling right or left wrist joint but reports she is able to use it well it is not really causing much pain or discomfort now.  She did not strike her head.  She does not take any blood thinners.   Past Medical History:  Diagnosis Date   Arthritis    Back pain    Insomnia    Medical history non-contributory    Osteoporosis    No numbness or weakness.  Pain with movement of the left hip joint, patient reports she is very concerned she might of broke her left hip       Physical Exam   Triage Vital Signs: ED Triage Vitals  Encounter Vitals Group     BP 06/13/23 1658 127/73     Systolic BP Percentile --      Diastolic BP Percentile --      Pulse Rate 06/13/23 1658 82     Resp 06/13/23 1658 16     Temp 06/13/23 1659 97.8 F (36.6 C)     Temp Source 06/13/23 1659 Oral     SpO2 06/13/23 1658 98 %     Weight --      Height 06/13/23 1701 5\' 6"  (1.676 m)     Head Circumference --      Peak Flow --      Pain Score --      Pain Loc --      Pain Education --      Exclude from Growth Chart --     Most recent vital signs: Vitals:   06/13/23 1730 06/13/23 1945  BP: (!) 115/90   Pulse: 71 84  Resp: 19 18  Temp:    SpO2: 95% 100%      General: Awake, no distress.  CV:  Good peripheral perfusion.  Strong palpable pulses in the left foot including dorsalis pedis posterior tibial normal use of the toes of the left foot able to wiggle and move. Resp:  Normal effort.  Clear bilateral Abd:  No distention.  Soft nontender nondistended Normocephalic atraumatic no cervical or thoracic tenderness. Other:  Patient with reproducible pain to axial loading and any sort of movement through the left hip joint.  No tenderness over the knee foot ankle tib-fib.  No obvious bruising injury no shortening or rotation but reproducible pain primarily across the trochanteric region and medial anterior pelvis.  Step-offs or deformities   ED Results / Procedures / Treatments   Labs (all labs ordered are listed, but only abnormal results are displayed) Labs Reviewed  BRAIN NATRIURETIC PEPTIDE  BASIC METABOLIC PANEL  CBC    EKG interpreted by me at 1715 heart rate 70 QRS 90 QTc 450 Normal sinus rhythm no evidence of acute ischemia.  RADIOLOGY  CT PELVIS WO CONTRAST Result Date: 06/13/2023 CLINICAL DATA:  Hip trauma, fracture suspected, no prior imaging eval for hip fracture on LEFT EXAM: CT PELVIS WITHOUT CONTRAST TECHNIQUE: Multidetector CT imaging of the pelvis was performed following the standard protocol without intravenous contrast. RADIATION DOSE REDUCTION: This exam was performed according to the departmental dose-optimization program which includes automated exposure control, adjustment of the mA and/or kV according to patient size and/or use of iterative reconstruction technique. COMPARISON:  Radiograph earlier today FINDINGS: Bones/Joint/Cartilage Mildly displaced and comminuted fracture of the left superior pubic ramus. Fracture extends to the pubic body. Minimally displaced fracture of the left inferior pubic ramus. Left proximal femur is intact. There is no hip dislocation. No sacral fracture. No pubic symphyseal or sacroiliac  diastasis. Hardware in the right proximal femur is intact were visualized. Previous proximal femur fracture has healed. Ligaments Suboptimally assessed by CT. Muscles and Tendons No definite intramuscular hemorrhage. Soft tissues Vascular calcifications in the pelvis. Enteric sutures in the colon. No confluent soft tissue hematoma. IMPRESSION: 1. Mildly displaced and comminuted fracture of the left superior pubic ramus. Fracture extends to the pubic body. 2. Minimally displaced fracture of the left inferior pubic ramus. Electronically Signed   By: Narda Rutherford M.D.   On: 06/13/2023 19:34   DG Hip Unilat W or Wo Pelvis 2-3 Views Left Result Date: 06/13/2023 CLINICAL DATA:  Left-sided pelvic pain after fall. EXAM: DG HIP (WITH OR WITHOUT PELVIS) 2-3V LEFT COMPARISON:  None available. FINDINGS: Mildly displaced fractures of the left superior and inferior pubic rami. The superior ramus fracture extends to the pubic body. No involvement of the pubic symphysis. No additional fracture of the pelvis or left hip. Right proximal femur hardware is intact were visualized traversing remote proximal femur fracture. No pubic symphyseal or sacroiliac diastasis. IMPRESSION: Mildly displaced fractures of the left superior and inferior pubic rami. Electronically Signed   By: Narda Rutherford M.D.   On: 06/13/2023 19:31     Discussed with Dr.Niu, that radiology final reads on imaging including left wrist and lumbar spine are not yet posted, he will follow-up on final result.  To my interpretation I do not see acute fracture in the lumbar spine x-rays or plain films of the left wrist.   PROCEDURES:  Critical Care performed: No  Procedures   MEDICATIONS ORDERED IN ED: Medications  ondansetron (ZOFRAN) injection 4 mg (has no administration in time range)  acetaminophen (TYLENOL) tablet 650 mg (has no administration in time range)  morphine (PF) 2 MG/ML injection 2 mg (has no administration in time range)   oxyCODONE-acetaminophen (PERCOCET/ROXICET) 5-325 MG per tablet 1 tablet (has no administration in time range)  methocarbamol (ROBAXIN) tablet 500 mg (has no administration in time range)  lidocaine (LIDODERM) 5 % 1 patch (has no administration in time range)  heparin injection 5,000 Units (has no administration in time range)  fentaNYL (SUBLIMAZE) injection 25 mcg (25 mcg Intravenous Given 06/13/23 1716)  oxyCODONE (Oxy IR/ROXICODONE) immediate release tablet 5 mg (5 mg Oral Given 06/13/23 1917)  ondansetron (ZOFRAN-ODT) disintegrating tablet 4 mg (4 mg Oral Given 06/13/23 1919)  fentaNYL (SUBLIMAZE) injection 25 mcg (25 mcg Intravenous Given 06/13/23 1946)     IMPRESSION / MDM / ASSESSMENT AND PLAN / ED COURSE  I reviewed the triage vital signs and the nursing notes.  Differential diagnosis includes, but is not limited to, possible hip or pelvic fractures orthopedic injury suffered from mechanical fall, etc.  Patient has some discomfort but no obvious deformity or angulation of the left wrist.  Also pretty severe pain overlying the left medial portion of the pelvis possibly in the hip area.  No shortening, neurovascular intact in all extremities.  Also having some mild lower back pain but no obvious focal lumbar tenderness.  No neurologic deficits  Did not suffer a head strike or neck injury  Patient's presentation is most consistent with acute complicated illness / injury requiring diagnostic workup.     ----------------------------------------- 9:07 PM on 06/13/2023 ----------------------------------------- Patient pain well-controlled at rest, but when she attempts to sit up or move the left lower extremity pretty significant pain I suspect from her pelvic fractures.  I do not foresee that she will able to safely go home to ambulate or with the use of assistive device at this time due to the degree of pain and pelvic fractures are notorious for causing fairly  severe pain.  Patient agreeable, consulted with patient accepted to Dr. Clyde Lundborg for ongoing management and treatment of suspected pelvic fractures as cause of pain     FINAL CLINICAL IMPRESSION(S) / ED DIAGNOSES   Final diagnoses:  Multiple closed fractures of pelvis without disruption of pelvic ring, initial encounter (HCC)     Rx / DC Orders   ED Discharge Orders     None        Note:  This document was prepared using Dragon voice recognition software and may include unintentional dictation errors.   Sharyn Creamer, MD 06/13/23 2107

## 2023-06-13 NOTE — ED Notes (Signed)
 Attempted to call x-ray with no answer

## 2023-06-13 NOTE — ED Notes (Signed)
 Pt aox4 provided food ok per ED physician pt spouse @ bs pt reports fall from standing this date resulting in current pelvic fx ED physician declines pelvic binder @ this time pt on cardiac, pulse ox and bp monitor

## 2023-06-14 DIAGNOSIS — S32409A Unspecified fracture of unspecified acetabulum, initial encounter for closed fracture: Secondary | ICD-10-CM | POA: Diagnosis not present

## 2023-06-14 DIAGNOSIS — S52502A Unspecified fracture of the lower end of left radius, initial encounter for closed fracture: Secondary | ICD-10-CM

## 2023-06-14 MED ORDER — OXYCODONE-ACETAMINOPHEN 5-325 MG PO TABS
2.0000 | ORAL_TABLET | ORAL | Status: DC | PRN
  Administered 2023-06-14 – 2023-06-19 (×12): 2 via ORAL
  Filled 2023-06-14 (×12): qty 2

## 2023-06-14 MED ORDER — ENOXAPARIN SODIUM 40 MG/0.4ML IJ SOSY
40.0000 mg | PREFILLED_SYRINGE | INTRAMUSCULAR | Status: DC
  Administered 2023-06-14 – 2023-06-18 (×5): 40 mg via SUBCUTANEOUS
  Filled 2023-06-14 (×5): qty 0.4

## 2023-06-14 MED ORDER — HYDROMORPHONE HCL 1 MG/ML IJ SOLN
1.0000 mg | INTRAMUSCULAR | Status: DC | PRN
  Administered 2023-06-17 – 2023-06-19 (×2): 1 mg via INTRAVENOUS
  Filled 2023-06-14 (×3): qty 1

## 2023-06-14 NOTE — Care Management Obs Status (Signed)
 MEDICARE OBSERVATION STATUS NOTIFICATION   Patient Details  Name: Sonya Park MRN: [id number] Date of Birth: 1940-06-30   Medicare Observation Status Notification Given:  Yes  Hard Copy signed at bedside, copy given to patient, copy in hard chart.   Bing Quarry, RN 06/14/2023, 4:55 PM

## 2023-06-14 NOTE — Plan of Care (Signed)
  Problem: Clinical Measurements: Goal: Ability to maintain clinical measurements within normal limits will improve Outcome: Progressing   Problem: Nutrition: Goal: Adequate nutrition will be maintained Outcome: Progressing   Problem: Coping: Goal: Level of anxiety will decrease Outcome: Progressing   Problem: Activity: Goal: Risk for activity intolerance will decrease Outcome: Progressing   Problem: Pain Managment: Goal: General experience of comfort will improve and/or be controlled Outcome: Progressing

## 2023-06-14 NOTE — Progress Notes (Signed)
 PROGRESS NOTE    Sonya Park  RUE:[id number] DOB: [birth year] DOA: 06/13/2023 PCP: Sallyanne Kuster, NP   Assessment & Plan:   Principal Problem:   Closed pelvic fracture (HCC) Active Problems:   Wrist fracture, closed, left, initial encounter   Fall at home, initial encounter   HTN (hypertension)   Chronic diastolic CHF (congestive heart failure) (HCC)   Iron deficiency anemia  Assessment and Plan: Closed pelvic fracture: of the left superior pubic ramus & minimally displaced fracture of the left inferior pubic ramus. WBAT of b/l LE as per ortho. Percocet, morphine prn   Left wrist fracture: impacted fracture of the distal radial metaphysis & nondisplaced ulna styloid fracture. Non weight bearing of the left arm. Percocet, morphine prn    Fall: PT/OT consulted    HTN: continue on home dose of aldactone    Chronic diastolic CHF: echo on 01/18/2017 showed EF of 45-50%. CHF appears compensated. Continue on home dose of aldactone. Monitor I/Os    IDA: continue on iron supplement        DVT prophylaxis: lovenox  Code Status: full  Family Communication:  Disposition Plan: depends on PT/OT recs.  Level of care: Med-Surg  Status is: Observation The patient remains OBS appropriate and will d/c before 2 midnights.    Consultants:  Ortho surg   Procedures:   Antimicrobials:    Subjective: Pt c/o pain   Objective: Vitals:   06/13/23 1945 06/13/23 2126 06/13/23 2244 06/14/23 0806  BP:  132/75  126/71  Pulse: 84 80  76  Resp: 18 16  15   Temp:  97.6 F (36.4 C)  98.2 F (36.8 C)  TempSrc:  Oral  Oral  SpO2: 100% 97%  91%  Weight:   70 kg   Height:   5' 5.98" (1.676 m)     Intake/Output Summary (Last 24 hours) at 06/14/2023 0840 Last data filed at 06/14/2023 0807 Gross per 24 hour  Intake --  Output 750 ml  Net -750 ml   Filed Weights   06/13/23 2244  Weight: 70 kg    Examination:  General exam: Appears calm but uncomfortable  Respiratory system:  Clear to auscultation. Respiratory effort normal. Cardiovascular system: S1 & S2 +. No  rubs, gallops or clicks. Gastrointestinal system: Abdomen is nondistended, soft and nontender. Normal bowel sounds heard. Central nervous system: Alert and oriented. Moves all extremities  Psychiatry: Judgement and insight appears at baseline. Mood & affect appropriate.     Data Reviewed: I have personally reviewed following labs and imaging studies  CBC: Recent Labs  Lab 06/13/23 2126  WBC 8.4  HGB 11.8*  HCT 35.8*  MCV 94.7  PLT 216   Basic Metabolic Panel: Recent Labs  Lab 06/13/23 2126  NA 140  K 3.5  CL 105  CO2 25  GLUCOSE 153*  BUN 20  CREATININE 0.68  CALCIUM 8.3*   GFR: Estimated Creatinine Clearance: 50.8 mL/min (by C-G formula based on SCr of 0.68 mg/dL). Liver Function Tests: No results for input(s): "AST", "ALT", "ALKPHOS", "BILITOT", "PROT", "ALBUMIN" in the last 168 hours. No results for input(s): "LIPASE", "AMYLASE" in the last 168 hours. No results for input(s): "AMMONIA" in the last 168 hours. Coagulation Profile: Recent Labs  Lab 06/13/23 2221  INR 1.0   Cardiac Enzymes: No results for input(s): "CKTOTAL", "CKMB", "CKMBINDEX", "TROPONINI" in the last 168 hours. BNP (last 3 results) No results for input(s): "PROBNP" in the last 8760 hours. HbA1C: No results for input(s): "HGBA1C" in  the last 72 hours. CBG: No results for input(s): "GLUCAP" in the last 168 hours. Lipid Profile: No results for input(s): "CHOL", "HDL", "LDLCALC", "TRIG", "CHOLHDL", "LDLDIRECT" in the last 72 hours. Thyroid Function Tests: No results for input(s): "TSH", "T4TOTAL", "FREET4", "T3FREE", "THYROIDAB" in the last 72 hours. Anemia Panel: No results for input(s): "VITAMINB12", "FOLATE", "FERRITIN", "TIBC", "IRON", "RETICCTPCT" in the last 72 hours. Sepsis Labs: No results for input(s): "PROCALCITON", "LATICACIDVEN" in the last 168 hours.  No results found for this or any  previous visit (from the past 240 hours).       Radiology Studies: DG Lumbar Spine 2-3 Views Result Date: 06/13/2023 CLINICAL DATA:  Low back pain.  Fall. EXAM: LUMBAR SPINE - 2-3 VIEW COMPARISON:  Radiograph and CT 09/17/2014 FINDINGS: There are 6 non-rib-bearing lumbar vertebra. The lower most lumbar vertebra will be labeled L5. Broad-based dextroscoliotic curvature is increased from prior exam. Remote L3 compression fracture without progressive loss of height from prior. There is a mild inferior endplate compression deformity of T12 (no ribs seen at this level) that is age indeterminate but new from 2016. Minor degenerative change for age with anterior spurring. The bones are subjectively under mineralized. IMPRESSION: 1. Age indeterminate mild T12 inferior endplate compression fracture. 2. Chronic L3 compression fracture. 3. Scoliosis and mild degenerative change. Electronically Signed   By: Narda Rutherford M.D.   On: 06/13/2023 21:40   DG Wrist Complete Left Result Date: 06/13/2023 CLINICAL DATA:  Pain after fall. EXAM: LEFT WRIST - COMPLETE 3+ VIEW COMPARISON:  None Available. FINDINGS: Impacted fracture of the distal radial metaphysis. No convincing intra-articular extension. Nondisplaced ulna styloid fracture. No dislocation. 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 Soft tissue edema is seen at the fracture site. IMPRESSION: 1. Impacted fracture of the distal radial metaphysis. 2. Nondisplaced ulna styloid fracture. Electronically Signed   By: Narda Rutherford M.D.   On: 06/13/2023 21:37   CT PELVIS WO CONTRAST Result Date: 06/13/2023 CLINICAL DATA:  Hip trauma, fracture suspected, no prior imaging eval for hip fracture on LEFT EXAM: CT PELVIS WITHOUT CONTRAST TECHNIQUE: Multidetector CT imaging of the pelvis was performed following the standard protocol without intravenous contrast. RADIATION DOSE REDUCTION: This exam was  performed according to the departmental dose-optimization program which includes automated exposure control, adjustment of the mA and/or kV according to patient size and/or use of iterative reconstruction technique. COMPARISON:  Radiograph earlier today FINDINGS: Bones/Joint/Cartilage Mildly displaced and comminuted fracture of the left superior pubic ramus. Fracture extends to the pubic body. Minimally displaced fracture of the left inferior pubic ramus. Left proximal femur is intact. There is no hip dislocation. No sacral fracture. No pubic symphyseal or sacroiliac diastasis. Hardware in the right proximal femur is intact were visualized. Previous proximal femur fracture has healed. Ligaments Suboptimally assessed by CT. Muscles and Tendons No definite intramuscular hemorrhage. Soft tissues Vascular calcifications in the pelvis. Enteric sutures in the colon. No confluent soft tissue hematoma. IMPRESSION: 1. Mildly displaced and comminuted fracture of the left superior pubic ramus. Fracture extends to the pubic body. 2. Minimally displaced fracture of the left inferior pubic ramus. Electronically Signed   By: Narda Rutherford M.D.   On: 06/13/2023 19:34   DG Hip Unilat W or Wo Pelvis 2-3 Views Left Result Date: 06/13/2023 CLINICAL DATA:  Left-sided pelvic pain after fall. EXAM: DG HIP (WITH OR WITHOUT PELVIS) 2-3V LEFT COMPARISON:  None available. FINDINGS: Mildly displaced fractures of the left superior and  inferior pubic rami. The superior ramus fracture extends to the pubic body. No involvement of the pubic symphysis. No additional fracture of the pelvis or left hip. Right proximal femur hardware is intact were visualized traversing remote proximal femur fracture. No pubic symphyseal or sacroiliac diastasis. IMPRESSION: Mildly displaced fractures of the left superior and inferior pubic rami. Electronically Signed   By: Narda Rutherford M.D.   On: 06/13/2023 19:31        Scheduled Meds:  [START ON  06/15/2023] aspirin EC  81 mg Oral Daily   ferrous sulfate  325 mg Oral Q breakfast   lidocaine  1 patch Transdermal Q24H   pantoprazole  40 mg Oral BID AC   spironolactone  25 mg Oral QODAY   Continuous Infusions:   LOS: 0 days       Charise Killian, MD [facility] Pager [phone]  If 7PM-7AM, please contact night-coverage [email or web address] 06/14/2023, 8:40 AM

## 2023-06-14 NOTE — Evaluation (Signed)
 Occupational Therapy Evaluation Patient Details Name: Sonya Park MRN: [id number] DOB: January 16, 1941 Today's Date: 06/14/2023   History of Present Illness   Closed pelvic fracture of the left superior pubic ramus & minimally displaced fracture of the left inferior pubic ramus. WBAT of b/l LE as per ortho. Left wrist fracture: impacted fracture of the distal radial metaphysis & nondisplaced ulna styloid fracture. Non weight bearing of the left arm. Percocet, morphine prn     Clinical Impressions Pt received in bed, endorsing 8/10 pain. She required Mod-Max A +2 and extended time for transferring supine-sitting, Max A for transferring sit<stand, and Max A +2 for performing step-pivot transfer from bed to chair. Pt required Max A for lower body dressing; anticipate she would required Mod A for UB dressing. Discussed DC recs with pt and spouse. They both recognized that pt requires more assistance at present than husband would be able to provide alone and that pt would benefit from more intensive rehab than what could be offered at home.      If plan is discharge home, recommend the following:   A lot of help with walking and/or transfers;A lot of help with bathing/dressing/bathroom;Assistance with cooking/housework;Assist for transportation;Help with stairs or ramp for entrance     Functional Status Assessment   Patient has had a recent decline in their functional status and demonstrates the ability to make significant improvements in function in a reasonable and predictable amount of time.     Equipment Recommendations   None recommended by OT     Recommendations for Other Services         Precautions/Restrictions   Precautions Precautions: Fall Precaution/Restrictions Comments: L LE WBAT;  L wrist NWB Required Braces or Orthoses: Splint/Cast Restrictions LLE Weight Bearing Per Provider Order: Weight bearing as tolerated Other Position/Activity Restrictions: NWB L UE      Mobility Bed Mobility Overal bed mobility: Needs Assistance Bed Mobility: Supine to Sit     Supine to sit: Mod assist, +2 for physical assistance          Transfers Overall transfer level: Needs assistance   Transfers: Sit to/from Stand, Bed to chair/wheelchair/BSC Sit to Stand: Max assist, +2 physical assistance     Step pivot transfers: +2 physical assistance, Max assist            Balance Overall balance assessment: Needs assistance Sitting-balance support: No upper extremity supported Sitting balance-Leahy Scale: Fair     Standing balance support: Single extremity supported Standing balance-Leahy Scale: Poor                             ADL either performed or assessed with clinical judgement   ADL Overall ADL's : Needs assistance/impaired                     Lower Body Dressing: Maximal assistance Lower Body Dressing Details (indicate cue type and reason): donning socks                     Vision         Perception         Praxis         Pertinent Vitals/Pain Pain Assessment Pain Assessment: 0-10 Pain Score: 8  Pain Descriptors / Indicators: Aching Pain Intervention(s): Limited activity within patient's tolerance, Monitored during session, Repositioned, Premedicated before session     Extremity/Trunk Assessment Upper Extremity Assessment Upper Extremity Assessment: LUE  deficits/detail LUE Deficits / Details: NWB   Lower Extremity Assessment Lower Extremity Assessment: Generalized weakness;LLE deficits/detail LLE Deficits / Details: WBAT LLE Sensation: history of peripheral neuropathy       Communication Communication Communication: No apparent difficulties   Cognition Arousal: Alert Behavior During Therapy: WFL for tasks assessed/performed Cognition: No apparent impairments                                       Cueing  General Comments          Exercises Other Exercises Other  Exercises: Educ re: DC recs, falls prevention   Shoulder Instructions      Home Living Family/patient expects to be discharged to:: Private residence Living Arrangements: Spouse/significant other Available Help at Discharge: Family Type of Home: House       Home Layout: One level     Bathroom Shower/Tub: Producer, television/film/video: Standard     Home Equipment: Cane - single point;Shower seat;Toilet riser          Prior Functioning/Environment Prior Level of Function : Needs assist             Mobility Comments: Ambulates w/o AD at home, uses [facility] in community ADLs Comments: Husband assists pt w/ bed mobility, lower body dressing    OT Problem List: Decreased strength;Decreased range of motion;Decreased activity tolerance;Impaired balance (sitting and/or standing);Pain   OT Treatment/Interventions: Self-care/ADL training;Therapeutic exercise;Neuromuscular education;Energy conservation;DME and/or AE instruction;Therapeutic activities      OT Goals(Current goals can be found in the care plan section)   Acute Rehab OT Goals Patient Stated Goal: to be able to DC soon from rehab OT Goal Formulation: With patient Time For Goal Achievement: 06/16/23 Potential to Achieve Goals: Good   OT Frequency:  Min 2X/week    Co-evaluation              AM-PAC OT "6 Clicks" Daily Activity     Outcome Measure Help from another person eating meals?: A Little Help from another person taking care of personal grooming?: A Little Help from another person toileting, which includes using toliet, bedpan, or urinal?: A Lot Help from another person bathing (including washing, rinsing, drying)?: A Lot Help from another person to put on and taking off regular upper body clothing?: A Little Help from another person to put on and taking off regular lower body clothing?: A Lot 6 Click Score: 15   End of Session    Activity Tolerance: Patient tolerated treatment well Patient  left: in chair;with family/visitor present  OT Visit Diagnosis: Unsteadiness on feet (R26.81);Muscle weakness (generalized) (M62.81);Pain;History of falling (Z91.81) Pain - Right/Left: Left Pain - part of body: Hip;Arm                Time: 1140-1220 OT Time Calculation (min): 40 min Charges:  OT General Charges $OT Visit: 1 Visit OT Evaluation $OT Eval Moderate Complexity: 1 Mod OT Treatments $Self Care/Home Management : 23-37 mins Latina Craver, PhD, MS, OTR/L 06/14/23, 1:15 PM

## 2023-06-14 NOTE — Progress Notes (Signed)
 PT Cancellation Note  Patient Details Name: Sonya Park MRN: [id number] DOB: 21-Jun-1940   Cancelled Treatment:    Reason Eval/Treat Not Completed: Other (comment).  Chart reviewed and spoke with attending.  Pt currently in significant amount of pain.  Will hold until pain is under control per MD.  Will re-attempt at later date/time as medically appropriate.   Nolon Bussing, PT, DPT Physical Therapist - [facility]  06/14/23, 9:54 AM

## 2023-06-14 NOTE — Plan of Care (Signed)

## 2023-06-14 NOTE — Evaluation (Signed)
 Physical Therapy Evaluation Patient Details Name: Sonya Park MRN: [id number] DOB: 21-Jun-1940 Today's Date: 06/14/2023  History of Present Illness  Closed pelvic fracture of the left superior pubic ramus & minimally displaced fracture of the left inferior pubic ramus. WBAT of b/l LE as per ortho. Left wrist fracture: impacted fracture of the distal radial metaphysis & nondisplaced ulna styloid fracture. Non weight bearing of the left arm. Percocet, morphine prn   Clinical Impression  Pt received in Semi-Fowler's position and agreeable to therapy.  Pt's husband in room upon arrival as well.  Pt given verbal instructions for weight bearing precautions at this time on L UE and L LE.  Pt able to recall those.  Pt currently in splint for forearm and was given L platform walker to utilize during hsopitilization.  Pt will require one at discharge as well.  Pt required maxA +1 from therapist to come upright into seated position and to return to supine position at the end of the session.  Pt only needing modA +1 from therapist to come upright into standing.  Pt did require frequent cuing for hand placement on the L UE.  Pt ambulated within the room short distance and required modA for steering of the walker due to weakness on the L side, specifically with turns.  Pt then returned to bed and was left with all needs met and call bell within reach.  Nursing notified of pain via secure chat.         If plan is discharge home, recommend the following: A lot of help with walking and/or transfers;A lot of help with bathing/dressing/bathroom;Assistance with cooking/housework;Help with stairs or ramp for entrance;Assist for transportation   Can travel by private vehicle   No    Equipment Recommendations Other (comment) (Left platform walker)  Recommendations for Other Services       Functional Status Assessment Patient has had a recent decline in their functional status and demonstrates the ability to make  significant improvements in function in a reasonable and predictable amount of time.     Precautions / Restrictions Precautions Precautions: Fall Precaution/Restrictions Comments: L LE WBAT;  L wrist NWB Required Braces or Orthoses: Splint/Cast Restrictions LLE Weight Bearing Per Provider Order: Weight bearing as tolerated Other Position/Activity Restrictions: NWB L UE      Mobility  Bed Mobility Overal bed mobility: Needs Assistance Bed Mobility: Supine to Sit     Supine to sit: Mod assist, +2 for physical assistance          Transfers Overall transfer level: Needs assistance   Transfers: Sit to/from Stand, Bed to chair/wheelchair/BSC Sit to Stand: Max assist, +2 physical assistance   Step pivot transfers: +2 physical assistance, Max assist            Ambulation/Gait Ambulation/Gait assistance: Contact guard assist, Min assist Gait Distance (Feet): 12 Feet Assistive device: Left platform walker Gait Pattern/deviations: Step-to pattern Gait velocity: significantly decreased     General Gait Details: Pt with limited mobility and required minA for advancing the walker during turns.  Pt ambulates with slowed, deliberate gait pattern and effortful when placing weight through the L LE.  Stairs            Wheelchair Mobility     Tilt Bed    Modified Rankin (Stroke Patients Only)       Balance Overall balance assessment: Needs assistance Sitting-balance support: No upper extremity supported Sitting balance-Leahy Scale: Fair     Standing balance support: Single extremity  supported Standing balance-Leahy Scale: Poor                               Pertinent Vitals/Pain Pain Assessment Pain Assessment: Faces Faces Pain Scale: Hurts whole lot Pain Descriptors / Indicators: Aching    Home Living Family/patient expects to be discharged to:: Private residence Living Arrangements: Spouse/significant other Available Help at Discharge:  Family Type of Home: House         Home Layout: One level Home Equipment: Gilmer Mor - single point;Shower seat;Toilet riser      Prior Function Prior Level of Function : Needs assist             Mobility Comments: Ambulates w/o AD at home, uses [facility] in community ADLs Comments: Husband assists pt w/ bed mobility, lower body dressing     Extremity/Trunk Assessment   Upper Extremity Assessment Upper Extremity Assessment: LUE deficits/detail LUE Deficits / Details: NWB    Lower Extremity Assessment Lower Extremity Assessment: Generalized weakness;LLE deficits/detail LLE Deficits / Details: WBAT LLE Sensation: history of peripheral neuropathy       Communication   Communication Communication: No apparent difficulties    Cognition Arousal: Alert Behavior During Therapy: WFL for tasks assessed/performed                                     Cueing       General Comments      Exercises     Assessment/Plan    PT Assessment Patient needs continued PT services  PT Problem List Decreased strength;Decreased activity tolerance;Decreased balance;Decreased mobility;Decreased range of motion;Decreased knowledge of use of DME;Decreased safety awareness       PT Treatment Interventions DME instruction;Gait training;Stair training;Functional mobility training;Therapeutic activities;Therapeutic exercise;Balance training;Neuromuscular re-education    PT Goals (Current goals can be found in the Care Plan section)  Acute Rehab PT Goals Patient Stated Goal: to go to rehab and get better. PT Goal Formulation: With patient/family Time For Goal Achievement: 06/28/23 Potential to Achieve Goals: Good    Frequency 7X/week     Co-evaluation               AM-PAC PT "6 Clicks" Mobility  Outcome Measure Help needed turning from your back to your side while in a flat bed without using bedrails?: A Lot Help needed moving from lying on your back to sitting on the  side of a flat bed without using bedrails?: A Lot Help needed moving to and from a bed to a chair (including a wheelchair)?: A Lot Help needed standing up from a chair using your arms (e.g., wheelchair or bedside chair)?: A Little Help needed to walk in hospital room?: A Little Help needed climbing 3-5 steps with a railing? : Total 6 Click Score: 13    End of Session Equipment Utilized During Treatment: Gait belt Activity Tolerance: Patient limited by pain Patient left: in bed;with call bell/phone within reach;with bed alarm set;with family/visitor present Nurse Communication: Mobility status PT Visit Diagnosis: Unsteadiness on feet (R26.81);Other abnormalities of gait and mobility (R26.89);Repeated falls (R29.6);Muscle weakness (generalized) (M62.81);History of falling (Z91.81);Difficulty in walking, not elsewhere classified (R26.2)    Time: 4098-1191 PT Time Calculation (min) (ACUTE ONLY): 53 min   Charges:   PT Evaluation $PT Eval Moderate Complexity: 1 Mod PT Treatments $Therapeutic Activity: 38-52 mins PT General Charges $$ ACUTE PT VISIT:  1 Visit         Nolon Bussing, PT, DPT Physical Therapist - [facility]  06/14/23, 4:54 PM

## 2023-06-14 NOTE — Consult Note (Signed)
 ORTHOPAEDIC CONSULTATION  REQUESTING PHYSICIAN: Charise Killian, MD  Chief Complaint:   Left distal radius fracture, left inferior and superior pubic ramus fractures  History of Present Illness: Sonya Park is a 83 y.o. 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  She injured her left wrist and endorses pain over her left lateral hip and left groin.  She denies any pain prior to the fall in the left hip or the left wrist.  She reports she is right-hand dominant.  Denies any numbness or tingling.  Denies any chest pain shortness of breath at this time.  She does endorse some low back pain.  Past Medical History:  Diagnosis Date   Arthritis    Back pain    Insomnia    Medical history non-contributory    Osteoporosis    Past Surgical History:  Procedure Laterality Date   ABDOMINAL HYSTERECTOMY     BREAST LUMPECTOMY Right    COLONOSCOPY WITH PROPOFOL N/A 07/18/2015   Procedure: COLONOSCOPY WITH PROPOFOL;  Surgeon: Midge Minium, MD;  Location: [facility] END[facility]COPY;  Service: Endoscopy;  Laterality: N/A;   COSMETIC SURGERY     GASTRIC BYPASS     X2   HEMORROIDECTOMY     KNEE ARTHROPLASTY Right 12/13/2015   Procedure: COMPUTER ASSISTED TOTAL KNEE ARTHROPLASTY;  Surgeon: Donato Heinz, MD;  Location: [facility] ORS;  Service: Orthopedics;  Laterality: Right;   KNEE ARTHROSCOPY     REPLACEMENT TOTAL KNEE Left    SHOULDER SURGERY Right    Social History   Socioeconomic History   Marital status: Married    Spouse name: Taisia Fantini   Number of children: 1   Years of education: 12   Highest education level: Some college, no degree  Occupational History   Occupation: Retired  Tobacco Use    Smoking status: Former    Current packs/day: 0.00    Average packs/day: 2.0 packs/day for 29.0 years (58.0 ttl pk-yrs)    Types: Cigarettes    Start date: 11/29/1965    Quit date: 11/30/1994    Years since quitting: 28.5   Smokeless tobacco: Never  Vaping Use   Vaping status: Never Used  Substance and Sexual Activity   Alcohol use: Not Currently    Alcohol/week: 21.0 - 35.0 standard drinks of alcohol    Types: 21 - 35 Glasses of wine per week    Comment: 3-5 glasses of wine/ day   Drug use: No   Sexual activity: Not Currently  Other Topics Concern   Not on file  Social History Narrative   Not on file   Social Drivers of Health   Financial Resource Strain: Low Risk  (07/24/2022)   Received from [facility], [facility]   Overall Financial Resource Strain (CARDIA)    Difficulty of Paying Living Expenses: Not hard at all  Food Insecurity: No Food Insecurity (06/13/2023)   Hunger Vital Sign    Worried About Running Out of Food in the Last Year: Never true    Ran Out of Food in the Last Year: Never true  Transportation Needs: No Transportation Needs (06/13/2023)   PRAPARE - Administrator, Civil Service (Medical): No    Lack of Transportation (Non-Medical): No  Physical Activity:  Inactive (03/14/2022)   Exercise Vital Sign    Days of Exercise per Week: 0 days    Minutes of Exercise per Session: 0 min  Stress: Stress Concern Present (03/14/2022)   [employer] of Occupational Health - Occupational Stress Questionnaire    Feeling of Stress : To some extent  Social Connections: Moderately Isolated (06/13/2023)   Social Connection and Isolation Panel [NHANES]    Frequency of Communication with Friends and Family: More than three times a week    Frequency of Social Gatherings with Friends and Family: Once a week    Attends Religious Services: Never    Database administrator or Organizations: No    Attends Engineer, structural:  Never    Marital Status: Married   Family History  Problem Relation Age of Onset   Liver disease Mother    Heart attack Father    Cancer Father    CAD Sister    Allergies  Allergen Reactions   Chlorpheniramine    Penicillins Swelling    Has patient had a PCN reaction causing immediate rash, facial/tongue/throat swelling, SOB or lightheadedness with hypotension: Yes Has patient had a PCN reaction causing severe rash involving mucus membranes or skin necrosis: No Has patient had a PCN reaction that required hospitalization: No Has patient had a PCN reaction occurring within the last 10 years: No If all of the above answers are "NO", then may proceed with Cephalosporin use. Other reaction(s): SWELLING Other reaction(s): other   Other Itching and Other (See Comments)    "mycin" doesn't know which one "mycin" doesn't know which one   Prior to Admission medications   Medication Sig Start Date End Date Taking? Authorizing Provider  aspirin 81 MG tablet Take 81 mg by mouth daily. Once a week (every Sunday)    [provider]  b complex vitamins capsule Take 1 capsule by mouth daily.    [provider]  Biotin (BIOTIN EXTRA STRENGTH) 10 MG CAPS Take by mouth.    [provider]  calcium citrate-vitamin D (CITRACAL+D) 315-200 MG-UNIT tablet Take by mouth.    [provider]  celecoxib (CELEBREX) 200 MG capsule Take by mouth. 04/14/23   [provider]  Cholecalciferol (VITAMIN D) 50 MCG (2000 UT) CAPS Take by mouth.    [provider]  Cholecalciferol 125 MCG (5000 UT) TABS Take by mouth.    [provider]  Copper Gluconate 2 MG CAPS Take 4 mg by mouth daily. 02/26/17   Earna Coder, MD  Cyanocobalamin (B-12) 3000 MCG CAPS Take by mouth.    [provider]  FEROSUL 325 (65 Fe) MG tablet TAKE 1 TABLET DAILY 01/01/22   Corky Downs, MD  Melatonin 5 MG TABS Take 2.5 mg by mouth. As needed    [provider]  Multiple Vitamins-Minerals (BARIATRIC MULTIVITAMINS/IRON PO) Take 2 capsules by mouth every morning. After breakfast.    [provider]  pantoprazole (PROTONIX) 40 MG tablet Take 1 tablet (40 mg total) by mouth 2 (two) times daily before a meal. 08/30/21   Midge Minium, MD  PREVIDENT 5000 SENSITIVE 1.1-5 % GEL Place 1 Application onto teeth as directed. 01/01/22   [provider]  spironolactone (ALDACTONE) 25 MG tablet Take 1 tablet (25 mg total) by mouth daily. Patient taking differently: Take 25 mg by mouth every other day. 07/26/21   Corky Downs, MD  thiamine 100 MG tablet Take 1 tablet (100 mg total) daily by mouth.  02/12/17   Ramonita Lab, MD   DG Lumbar Spine 2-3 Views Result Date: 06/13/2023 CLINICAL DATA:  Low back pain.  Fall. EXAM: LUMBAR SPINE - 2-3 VIEW COMPARISON:  Radiograph and CT 09/17/2014 FINDINGS: There are 6 non-rib-bearing lumbar vertebra. The lower most lumbar vertebra will be labeled L5. Broad-based dextroscoliotic curvature is increased from prior exam. Remote L3 compression fracture without progressive loss of height from prior. There is a mild inferior endplate compression deformity of T12 (no ribs seen at this level) that is age indeterminate but new from 2016. Minor degenerative change for age with anterior spurring. The bones are subjectively under mineralized. IMPRESSION: 1. Age indeterminate mild T12 inferior endplate compression fracture. 2. Chronic L3 compression fracture. 3. Scoliosis and mild degenerative change. Electronically Signed   By: Narda Rutherford M.D.   On: 06/13/2023 21:40   DG Wrist Complete Left Result Date: 06/13/2023 CLINICAL DATA:  Pain after fall. EXAM: LEFT WRIST - COMPLETE 3+ VIEW COMPARISON:  None Available. FINDINGS: Impacted fracture of the distal radial metaphysis. No convincing intra-articular extension. Nondisplaced ulna styloid fracture. No dislocation. 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 Soft tissue edema is seen at the fracture site. IMPRESSION: 1. Impacted fracture of the distal radial metaphysis. 2. Nondisplaced ulna styloid fracture. Electronically Signed   By: Narda Rutherford M.D.   On: 06/13/2023 21:37   CT PELVIS WO CONTRAST Result Date: 06/13/2023 CLINICAL DATA:  Hip trauma, fracture suspected, no prior imaging eval for hip fracture on LEFT EXAM: CT PELVIS WITHOUT CONTRAST TECHNIQUE: Multidetector CT imaging of the pelvis was performed following the standard protocol without intravenous contrast. RADIATION DOSE REDUCTION: This exam was performed according to the departmental dose-optimization program which includes automated exposure control, adjustment of the mA and/or kV according to patient size and/or use of iterative reconstruction technique. COMPARISON:  Radiograph earlier today FINDINGS: Bones/Joint/Cartilage Mildly displaced and comminuted fracture of the left superior pubic ramus. Fracture extends to the pubic body. Minimally displaced fracture of the left inferior pubic ramus. Left proximal femur is intact. There is no hip dislocation. No sacral fracture. No pubic symphyseal or sacroiliac diastasis. Hardware in the right proximal femur is intact were visualized. Previous proximal femur fracture has healed. Ligaments Suboptimally assessed by CT. Muscles and Tendons No definite intramuscular hemorrhage. Soft tissues Vascular calcifications in the pelvis. Enteric sutures in the colon. No confluent soft tissue hematoma. IMPRESSION: 1. Mildly displaced and comminuted fracture of the left superior pubic ramus. Fracture extends to the pubic body. 2. Minimally displaced fracture of the left inferior pubic ramus. Electronically Signed   By: Narda Rutherford M.D.   On: 06/13/2023 19:34   DG Hip Unilat W or Wo Pelvis 2-3 Views Left Result Date: 06/13/2023 CLINICAL DATA:  Left-sided pelvic pain after fall. EXAM: DG HIP (WITH OR WITHOUT  PELVIS) 2-3V LEFT COMPARISON:  None available. FINDINGS: Mildly displaced fractures of the left superior and inferior pubic rami. The superior ramus fracture extends to the pubic body. No involvement of the pubic symphysis. No additional fracture of the pelvis or left hip. Right proximal femur hardware is intact were visualized traversing remote proximal femur fracture. No pubic symphyseal or sacroiliac diastasis. IMPRESSION: Mildly displaced fractures of the left superior and inferior pubic rami. Electronically Signed   By: Narda Rutherford M.D.   On: 06/13/2023 19:31    Positive ROS: All other systems have been reviewed and were otherwise negative with the exception of those mentioned in  the HPI and as above.  Physical Exam: General:  Alert, no acute distress Psychiatric:  Patient is competent for consent with normal mood and affect   Cardiovascular:  No pedal edema Respiratory:  No wheezing, non-labored breathing GI:  Abdomen is soft and non-tender Skin:  No lesions in the area of chief complaint Neurologic:  Sensation intact distally Lymphatic:  No axillary or cervical lymphadenopathy  Orthopedic Exam:  Left upper extremity Skin intact over the wrist with swelling noted over the dorsal distal radius no splint or brace was placed prior to my evaluation Patient is tender over the distal radius and distal tip of the ulna Compartments all soft Able to gently range the wrist and move all fingers Neurovascular intact with intact radial pulse AIN/PIN/U/M/R intact  Left lower extremity Skin intact over the hip with tenderness palpation over the lateral hip and some ecchymosis Also tender over the left groin and pubic symphysis No pain with logroll or simulated axial load to the left leg No pain over the distal femur, knee, tibia, ankle or foot Compartments all soft neurovascular intact distally to the foot able to dorsiflex and plantarflex the foot and toes  Secondary survey No tenderness  to palpation over other bony prominences in the lower extremities or bilateral upper extremities except as noted above No pain with logroll or simulated axial loading of the right lower extremity All compartments soft No tenderness to palpation over the cervical or thoracic spine, no bony step-off Motor grossly intact throughout, no focal deficits Sensation grossly intact throughout, no focal deficits Good distal pulses and capillary refill on all extremities   X-rays:  X-rays of the left wrist left hip and pelvis and CT scan of the pelvis reviewed which show an impacted left distal radius fracture with a small radial styloid fracture essentially nondisplaced distal radius fracture. CT scan and x-rays of the left hip and pelvis show no hip fractures status post right hip fixation with no evidence of periprosthetic fracture.  Noted left acute appearing inferior and superior pubic ramus fractures.  Agree with radiology interpretation  Assessment: Left distal radius fracture Left inferior and superior pubic ramus fracture  Plan: I reviewed the radiographic and clinical findings with the patient at bedside.  With regards to her left wrist the fracture is essentially nondisplaced and I am recommending an initial course of nonoperative treatment with a left wrist brace and nonweightbearing to the left upper extremity.  I did discuss with the patient that there is risk for displacement should she put weight on that arm and she will avoid using it.  We will get repeat x-rays of the left wrist in the office for further treatment and evaluation going forward.  With regards to the left inferior and superior pubic ramus fractures these will be nonoperative fractures at this time and she may weight-bear as tolerated on the left lower extremity for ambulation.  Given the left wrist I am recommending a platform walker with a left forearm support so that she may weight-bear through her left elbow.  Adequate pain  control prior to physical therapy will be necessary in order to mobilize the patient.  All of this was reviewed with the patient no plan for any orthopedic surgical intervention on this admission patient may follow-up in the office next week for repeat x-rays of the wrist and pelvis after discharge.  All questions answered she agrees with the above plan.    Reinaldo Berber MD  Beeper #:  [phone]  06/14/2023 9:58 AM

## 2023-06-15 DIAGNOSIS — S32409A Unspecified fracture of unspecified acetabulum, initial encounter for closed fracture: Secondary | ICD-10-CM | POA: Diagnosis not present

## 2023-06-15 LAB — CBC
HCT: 32.8 % — ABNORMAL LOW (ref 36.0–46.0)
Hemoglobin: 10.8 g/dL — ABNORMAL LOW (ref 12.0–15.0)
MCH: 30.3 pg (ref 26.0–34.0)
MCHC: 32.9 g/dL (ref 30.0–36.0)
MCV: 92.1 fL (ref 80.0–100.0)
Platelets: 187 10*3/uL (ref 150–400)
RBC: 3.56 MIL/uL — ABNORMAL LOW (ref 3.87–5.11)
RDW: 13.7 % (ref 11.5–15.5)
WBC: 6.1 10*3/uL (ref 4.0–10.5)
nRBC: 0 % (ref 0.0–0.2)

## 2023-06-15 MED ORDER — ASPIRIN 81 MG PO TBEC: 81.0000 mg | DELAYED_RELEASE_TABLET | ORAL | Status: DC

## 2023-06-15 NOTE — Progress Notes (Signed)
 PROGRESS NOTE    Sonya Park  ZOX:[id number] DOB: 1940-11-24 DOA: 06/13/2023 PCP: Sallyanne Kuster, NP   Assessment & Plan:   Principal Problem:   Closed pelvic fracture (HCC) Active Problems:   Wrist fracture, closed, left, initial encounter   Fall at home, initial encounter   HTN (hypertension)   Chronic diastolic CHF (congestive heart failure) (HCC)   Iron deficiency anemia   Closed fracture of left distal radius  Assessment and Plan: Closed pelvic fracture: of the left superior pubic ramus & minimally displaced fracture of the left inferior pubic ramus. WBAT of b/l LE as per ortho. Percocet, morphine prn   Left wrist fracture: impacted fracture of the distal radial metaphysis & nondisplaced ulna styloid fracture. Non weight bearing of left arm. Percocet, morphine prn    Fall: PT/OT recs SNF   HTN: continue on home dose of aldactone    Chronic diastolic CHF: echo on 01/18/2017 showed EF of 45-50%. CHF appears compensated. Continue on home dose of aldactone. Monitor I/Os    IDA: continue on iron supplement        DVT prophylaxis: lovenox  Code Status: full  Family Communication: discussed pt's care w/ pt's family at bedside and answered their questions  Disposition Plan: likely d/c to SNF  Level of care: Med-Surg  Status is: Observation The patient remains OBS appropriate and will d/c before 2 midnights.    Consultants:  Ortho surg   Procedures:   Antimicrobials:    Subjective: Pt c/o pelvic pain   Objective: Vitals:   06/14/23 0806 06/14/23 1653 06/14/23 2022 06/15/23 0749  BP: 126/71 114/65 96/62 128/85  Pulse: 76 82 (!) 56 81  Resp: 15 16 14 18   Temp: 98.2 F (36.8 C) 98.4 F (36.9 C)  98.7 F (37.1 C)  TempSrc: Oral Oral    SpO2: 91% 92% 92% 92%  Weight:      Height:        Intake/Output Summary (Last 24 hours) at 06/15/2023 0846 Last data filed at 06/14/2023 1954 Gross per 24 hour  Intake 170 ml  Output 500 ml  Net -330 ml    Filed Weights   06/13/23 2244  Weight: 70 kg    Examination:  General exam: Appears uncomfortable  Respiratory system: clear breath sounds b/l  Cardiovascular system: S1/S2. No rubs or clicks Gastrointestinal system: abd is soft, NT, ND & hypoactive bowel sounds  Central nervous system: Alert & awake. Moves all extremities  Psychiatry: judgement and insight appears at baseline. Flat mood and affect     Data Reviewed: I have personally reviewed following labs and imaging studies  CBC: Recent Labs  Lab 06/13/23 2126 06/15/23 0523  WBC 8.4 6.1  HGB 11.8* 10.8*  HCT 35.8* 32.8*  MCV 94.7 92.1  PLT 216 187   Basic Metabolic Panel: Recent Labs  Lab 06/13/23 2126  NA 140  K 3.5  CL 105  CO2 25  GLUCOSE 153*  BUN 20  CREATININE 0.68  CALCIUM 8.3*   GFR: Estimated Creatinine Clearance: 50.8 mL/min (by C-G formula based on SCr of 0.68 mg/dL). Liver Function Tests: No results for input(s): "AST", "ALT", "ALKPHOS", "BILITOT", "PROT", "ALBUMIN" in the last 168 hours. No results for input(s): "LIPASE", "AMYLASE" in the last 168 hours. No results for input(s): "AMMONIA" in the last 168 hours. Coagulation Profile: Recent Labs  Lab 06/13/23 2221  INR 1.0   Cardiac Enzymes: No results for input(s): "CKTOTAL", "CKMB", "CKMBINDEX", "TROPONINI" in the last 168 hours. BNP (  last 3 results) No results for input(s): "PROBNP" in the last 8760 hours. HbA1C: No results for input(s): "HGBA1C" in the last 72 hours. CBG: No results for input(s): "GLUCAP" in the last 168 hours. Lipid Profile: No results for input(s): "CHOL", "HDL", "LDLCALC", "TRIG", "CHOLHDL", "LDLDIRECT" in the last 72 hours. Thyroid Function Tests: No results for input(s): "TSH", "T4TOTAL", "FREET4", "T3FREE", "THYROIDAB" in the last 72 hours. Anemia Panel: No results for input(s): "VITAMINB12", "FOLATE", "FERRITIN", "TIBC", "IRON", "RETICCTPCT" in the last 72 hours. Sepsis Labs: No results for input(s):  "PROCALCITON", "LATICACIDVEN" in the last 168 hours.  No results found for this or any previous visit (from the past 240 hours).       Radiology Studies: DG Lumbar Spine 2-3 Views Result Date: 06/13/2023 CLINICAL DATA:  Low back pain.  Fall. EXAM: LUMBAR SPINE - 2-3 VIEW COMPARISON:  Radiograph and CT 09/17/2014 FINDINGS: There are 6 non-rib-bearing lumbar vertebra. The lower most lumbar vertebra will be labeled L5. Broad-based dextroscoliotic curvature is increased from prior exam. Remote L3 compression fracture without progressive loss of height from prior. There is a mild inferior endplate compression deformity of T12 (no ribs seen at this level) that is age indeterminate but new from 2016. Minor degenerative change for age with anterior spurring. The bones are subjectively under mineralized. IMPRESSION: 1. Age indeterminate mild T12 inferior endplate compression fracture. 2. Chronic L3 compression fracture. 3. Scoliosis and mild degenerative change. Electronically Signed   By: Narda Rutherford M.D.   On: 06/13/2023 21:40   DG Wrist Complete Left Result Date: 06/13/2023 CLINICAL DATA:  Pain after fall. EXAM: LEFT WRIST - COMPLETE 3+ VIEW COMPARISON:  None Available. FINDINGS: Impacted fracture of the distal radial metaphysis. No convincing intra-articular extension. Nondisplaced ulna styloid fracture. No dislocation. 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 Soft tissue edema is seen at the fracture site. IMPRESSION: 1. Impacted fracture of the distal radial metaphysis. 2. Nondisplaced ulna styloid fracture. Electronically Signed   By: Narda Rutherford M.D.   On: 06/13/2023 21:37   CT PELVIS WO CONTRAST Result Date: 06/13/2023 CLINICAL DATA:  Hip trauma, fracture suspected, no prior imaging eval for hip fracture on LEFT EXAM: CT PELVIS WITHOUT CONTRAST TECHNIQUE: Multidetector CT imaging of the pelvis was performed following the  standard protocol without intravenous contrast. RADIATION DOSE REDUCTION: This exam was performed according to the departmental dose-optimization program which includes automated exposure control, adjustment of the mA and/or kV according to patient size and/or use of iterative reconstruction technique. COMPARISON:  Radiograph earlier today FINDINGS: Bones/Joint/Cartilage Mildly displaced and comminuted fracture of the left superior pubic ramus. Fracture extends to the pubic body. Minimally displaced fracture of the left inferior pubic ramus. Left proximal femur is intact. There is no hip dislocation. No sacral fracture. No pubic symphyseal or sacroiliac diastasis. Hardware in the right proximal femur is intact were visualized. Previous proximal femur fracture has healed. Ligaments Suboptimally assessed by CT. Muscles and Tendons No definite intramuscular hemorrhage. Soft tissues Vascular calcifications in the pelvis. Enteric sutures in the colon. No confluent soft tissue hematoma. IMPRESSION: 1. Mildly displaced and comminuted fracture of the left superior pubic ramus. Fracture extends to the pubic body. 2. Minimally displaced fracture of the left inferior pubic ramus. Electronically Signed   By: Narda Rutherford M.D.   On: 06/13/2023 19:34   DG Hip Unilat W or Wo Pelvis 2-3 Views Left Result Date: 06/13/2023 CLINICAL DATA:  Left-sided pelvic pain after fall. EXAM: DG  HIP (WITH OR WITHOUT PELVIS) 2-3V LEFT COMPARISON:  None available. FINDINGS: Mildly displaced fractures of the left superior and inferior pubic rami. The superior ramus fracture extends to the pubic body. No involvement of the pubic symphysis. No additional fracture of the pelvis or left hip. Right proximal femur hardware is intact were visualized traversing remote proximal femur fracture. No pubic symphyseal or sacroiliac diastasis. IMPRESSION: Mildly displaced fractures of the left superior and inferior pubic rami. Electronically Signed   By:  Narda Rutherford M.D.   On: 06/13/2023 19:31        Scheduled Meds:  aspirin EC  81 mg Oral Daily   enoxaparin (LOVENOX) injection  40 mg Subcutaneous Q24H   ferrous sulfate  325 mg Oral Q breakfast   lidocaine  1 patch Transdermal Q24H   pantoprazole  40 mg Oral BID AC   spironolactone  25 mg Oral QODAY   Continuous Infusions:   LOS: 0 days       Charise Killian, MD [facility] Pager [phone]  If 7PM-7AM, please contact night-coverage [email or web address] 06/15/2023, 8:46 AM

## 2023-06-15 NOTE — Plan of Care (Signed)

## 2023-06-15 NOTE — TOC Progression Note (Signed)
 Transition of Care [facility]) - Progression Note    Patient Details  Name: INEZE SERRAO MRN: [id number] Date of Birth: 1941-01-13  Transition of Care [facility]) CM/SW Contact  Bing Quarry, RN Phone Number: 06/15/2023, 5:00 PM  Clinical Narrative:  3/9: Medicare STR/SNF choice list provided to patient to review after a brief discussion on 3/8 about SNF recommendations.    Gabriel Cirri MSN RN CM  RN Case Manager [place]  Transitions of Care Direct Dial: [phone] (Weekends Only) [facility] Main Office Phone: [phone] [facility] Fax: [phone] [email or web address]           Expected Discharge Plan and Services                                               Social Determinants of Health (SDOH) Interventions SDOH Screenings   Food Insecurity: No Food Insecurity (06/13/2023)  Housing: Low Risk  (06/13/2023)  Transportation Needs: No Transportation Needs (06/13/2023)  Utilities: Not At Risk (06/13/2023)  Alcohol Screen: Low Risk  (03/14/2022)  Depression (PHQ2-9): Low Risk  (04/11/2023)  Financial Resource Strain: Low Risk  (07/24/2022)   Received from [facility], [facility]  Physical Activity: Inactive (03/14/2022)  Social Connections: Moderately Isolated (06/13/2023)  Stress: Stress Concern Present (03/14/2022)  Tobacco Use: Medium Risk (06/13/2023)    Readmission Risk Interventions     No data to display

## 2023-06-15 NOTE — Progress Notes (Signed)
 Physical Therapy Treatment Patient Details Name: Sonya Park MRN: [id number] DOB: Aug 18, 1940 Today's Date: 06/15/2023   History of Present Illness Closed pelvic fracture of the left superior pubic ramus & minimally displaced fracture of the left inferior pubic ramus. WBAT of b/l LE as per ortho. Left wrist fracture: impacted fracture of the distal radial metaphysis & nondisplaced ulna styloid fracture. Non weight bearing of the left arm. Percocet, morphine prn    PT Comments  Pt ready for session.  Pre-medicated per pt request.  She is able to get to EOB with increased time and mod a x 1. Steady in sitting.  Stands to platform RW with increased time, min a and mod vc's.  She is able to progress gait slowly to bathroom to void on raised seat then walks back to recliner and remained up after session.  She is able to help with pericare in standing.  Overall progressing with mobility.  Remains slow with gait and hesitant at times to step LLE and to transition to sitting due to pain.  Attentive husband in room.     If plan is discharge home, recommend the following: Assistance with cooking/housework;Help with stairs or ramp for entrance;Assist for transportation;A little help with walking and/or transfers;A little help with bathing/dressing/bathroom   Can travel by private vehicle     No  Equipment Recommendations  Other (comment) (Left platform walker)    Recommendations for Other Services       Precautions / Restrictions Precautions Precautions: Fall Precaution/Restrictions Comments: L LE WBAT;  L wrist NWB Required Braces or Orthoses: Splint/Cast Restrictions LLE Weight Bearing Per Provider Order: Weight bearing as tolerated Other Position/Activity Restrictions: NWB L UE     Mobility  Bed Mobility Overal bed mobility: Needs Assistance Bed Mobility: Supine to Sit     Supine to sit: Mod assist       Patient Response: Cooperative  Transfers Overall transfer level: Needs  assistance Equipment used: Left platform walker Transfers: Sit to/from Stand Sit to Stand: Min assist                Ambulation/Gait Ambulation/Gait assistance: Contact guard assist, Min assist Gait Distance (Feet): 12 Feet Assistive device: Left platform walker Gait Pattern/deviations: Step-to pattern Gait velocity: significantly decreased     General Gait Details: to bathroom and back   Stairs             Wheelchair Mobility     Tilt Bed Tilt Bed Patient Response: Cooperative  Modified Rankin (Stroke Patients Only)       Balance Overall balance assessment: Needs assistance Sitting-balance support: No upper extremity supported Sitting balance-Leahy Scale: Fair     Standing balance support: Bilateral upper extremity supported Standing balance-Leahy Scale: Poor Standing balance comment: +1 at all times                            Communication    Cognition Arousal: Alert Behavior During Therapy: WFL for tasks assessed/performed   PT - Cognitive impairments: No apparent impairments                                Cueing    Exercises Other Exercises Other Exercises: to bathroom to void    General Comments        Pertinent Vitals/Pain Pain Assessment Pain Assessment: Faces Faces Pain Scale: Hurts even more Pain Location: L LE  with WB increased pain. Pain Descriptors / Indicators: Aching, Sore Pain Intervention(s): Limited activity within patient's tolerance, Monitored during session, Premedicated before session, Repositioned    Home Living                          Prior Function            PT Goals (current goals can now be found in the care plan section) Progress towards PT goals: Progressing toward goals    Frequency    7X/week      PT Plan      Co-evaluation              AM-PAC PT "6 Clicks" Mobility   Outcome Measure  Help needed turning from your back to your side while in a  flat bed without using bedrails?: A Lot Help needed moving from lying on your back to sitting on the side of a flat bed without using bedrails?: A Lot Help needed moving to and from a bed to a chair (including a wheelchair)?: A Little Help needed standing up from a chair using your arms (e.g., wheelchair or bedside chair)?: A Little Help needed to walk in hospital room?: A Little Help needed climbing 3-5 steps with a railing? : Total 6 Click Score: 14    End of Session Equipment Utilized During Treatment: Gait belt Activity Tolerance: Patient limited by pain Patient left: in bed;with call bell/phone within reach;with family/visitor present;in chair;with chair alarm set;with bed alarm set Nurse Communication: Mobility status PT Visit Diagnosis: Unsteadiness on feet (R26.81);Other abnormalities of gait and mobility (R26.89);Repeated falls (R29.6);Muscle weakness (generalized) (M62.81);History of falling (Z91.81);Difficulty in walking, not elsewhere classified (R26.2)     Time: 1610-9604 PT Time Calculation (min) (ACUTE ONLY): 39 min  Charges:    $Gait Training: 38-52 mins PT General Charges $$ ACUTE PT VISIT: 1 Visit                   Danielle Dess, PTA 06/15/23, 3:16 PM

## 2023-06-16 DIAGNOSIS — S3289XA Fracture of other parts of pelvis, initial encounter for closed fracture: Secondary | ICD-10-CM | POA: Diagnosis not present

## 2023-06-16 LAB — CBC
HCT: 32.7 % — ABNORMAL LOW (ref 36.0–46.0)
Hemoglobin: 10.9 g/dL — ABNORMAL LOW (ref 12.0–15.0)
MCH: 30.9 pg (ref 26.0–34.0)
MCHC: 33.3 g/dL (ref 30.0–36.0)
MCV: 92.6 fL (ref 80.0–100.0)
Platelets: 177 10*3/uL (ref 150–400)
RBC: 3.53 MIL/uL — ABNORMAL LOW (ref 3.87–5.11)
RDW: 13.7 % (ref 11.5–15.5)
WBC: 6.1 10*3/uL (ref 4.0–10.5)
nRBC: 0 % (ref 0.0–0.2)

## 2023-06-16 MED ORDER — POLYETHYLENE GLYCOL 3350 17 G PO PACK
17.0000 g | PACK | Freq: Every day | ORAL | Status: DC
  Administered 2023-06-16 – 2023-06-19 (×4): 17 g via ORAL
  Filled 2023-06-16 (×4): qty 1

## 2023-06-16 MED ORDER — DOCUSATE SODIUM 100 MG PO CAPS
200.0000 mg | ORAL_CAPSULE | Freq: Two times a day (BID) | ORAL | Status: DC
  Administered 2023-06-16 – 2023-06-19 (×7): 200 mg via ORAL
  Filled 2023-06-16 (×7): qty 2

## 2023-06-16 NOTE — TOC Initial Note (Signed)
 Transition of Care [facility]) - Initial/Assessment Note    Patient Details  Name: Sonya Park MRN: [id number] Date of Birth: 1940-12-21  Transition of [facility]) CM/SW Contact:    Marlowe Sax, RN Phone Number: 06/16/2023, 4:33 PM  Clinical Narrative:                    Spoke with the patient and her husband they are agreeable to a bed search, PASSR obtained FL2 completed, bedsearch sent     Patient Goals and CMS Choice            Expected Discharge Plan and Services                                              Prior Living Arrangements/Services                       Activities of Daily Living   ADL Screening (condition at time of admission) Independently performs ADLs?: Yes (appropriate for developmental age) Is the patient deaf or have difficulty hearing?: No Does the patient have difficulty seeing, even when wearing glasses/contacts?: No Does the patient have difficulty concentrating, remembering, or making decisions?: No  Permission Sought/Granted                  Emotional Assessment              Admission diagnosis:  Closed pelvic fracture (HCC) [S32.9XXA] Multiple closed fractures of pelvis without disruption of pelvic ring, initial encounter (HCC) [S32.82XA] Patient Active Problem List   Diagnosis Date Noted   Closed fracture of left distal radius 06/14/2023   Closed pelvic fracture (HCC) 06/13/2023   HTN (hypertension) 06/13/2023   Chronic diastolic CHF (congestive heart failure) (HCC) 06/13/2023   Wrist fracture, closed, left, initial encounter 06/13/2023   Vitamin D deficiency 04/12/2023   Hypocalcemia 04/12/2023   Iron deficiency anemia 04/16/2022   Closed displaced intertrochanteric fracture of right femur (HCC) 03/05/2022   Closed fracture of proximal end of right humerus 03/05/2022   Fracture of right hip (HCC) 02/03/2022   Shoulder fracture, right, closed, initial encounter 02/03/2022   Fall at home, initial  encounter 02/03/2022   Neck pain 10/24/2021   Irritable bowel syndrome 10/14/2021   Obstructive sleep apnea of adult 10/14/2021   Vulvovaginitis 07/27/2021   Hypoglycemia 04/11/2021   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 01/16/2021   Vertigo 06/09/2020   Rotator cuff arthropathy of right shoulder 03/22/2020   Hyperlipidemia 02/21/2020   Need for COVID-19 vaccine 02/14/2020   Essential hypertension 09/02/2019   Seasonal allergic rhinitis due to pollen 09/02/2019   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) 11/17/2017   Lymphedema 08/11/2017   Anemia, macrocytic, nutritional    Macrocytic anemia    Dysphagia    Itching    Screening for colon cancer    LFT elevation    Severe malnutrition (HCC)    Adult failure to thrive    Hyperbilirubinemia    Alcoholic liver failure (HCC)    Thrombocytopenia (HCC)    Pressure injury of skin 01/19/2017   Bacteremia    Palliative care by specialist    Goals of care, counseling/discussion    Alcohol abuse 01/15/2017   Facial trauma 01/14/2017   Osteoporosis  01/14/2017   Neutropenia (HCC) 01/14/2017   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 [place] [address] Phone: [phone] Fax: [phone]  [facility] - [place], [place] - [address] [place] [address] Phone: [phone] Fax:  [phone]     Social Drivers of Health (SDOH) Social History: SDOH Screenings   Food Insecurity: No Food Insecurity (06/13/2023)  Housing: Low Risk  (06/13/2023)  Transportation Needs: No Transportation Needs (06/13/2023)  Utilities: Not At Risk (06/13/2023)  Alcohol Screen: Low Risk  (03/14/2022)  Depression (PHQ2-9): Low Risk  (04/11/2023)  Financial Resource Strain: Low Risk  (07/24/2022)   Received from [facility]  Physical Activity: Inactive (03/14/2022)  Social Connections: Moderately Isolated (06/13/2023)  Stress: Stress Concern Present (03/14/2022)  Tobacco Use: Medium Risk (06/13/2023)   SDOH Interventions:     Readmission Risk Interventions     No data to display

## 2023-06-16 NOTE — Progress Notes (Signed)
 PROGRESS NOTE    Sonya Park  ZOX:[id number] DOB: [birth year] DOA: 06/13/2023 PCP: Sallyanne Kuster, NP   Assessment & Plan:   Principal Problem:   Closed pelvic fracture (HCC) Active Problems:   Wrist fracture, closed, left, initial encounter   Fall at home, initial encounter   HTN (hypertension)   Chronic diastolic CHF (congestive heart failure) (HCC)   Iron deficiency anemia   Closed fracture of left distal radius  Assessment and Plan: Closed pelvic fracture: of the left superior pubic ramus & minimally displaced fracture of the left inferior pubic ramus. WBAT of b/l LE as per ortho. Percocet, morphine prn   Left wrist fracture: impacted fracture of the distal radial metaphysis & nondisplaced ulna styloid fracture. Non weight bearing of left arm. Percocet, morphine prn   Fall: PT/OT recs SNF. Waiting on SNF placement    HTN: continue on home dose of aldactone    Chronic diastolic CHF: echo on 01/18/2017 showed EF of 45-50%. CHF appears compensated. Continue on home dose of aldactone. Monitor I/Os   IDA: continue on iron supplement        DVT prophylaxis: lovenox  Code Status: full  Family Communication: discussed pt's care w/ pt's family at bedside and answered their questions  Disposition Plan: likely d/c to SNF  Level of care: Med-Surg  Status is: Observation The patient remains OBS appropriate and will d/c before 2 midnights. Medically stable. Waiting on SNF placement     Consultants:  Ortho surg   Procedures:   Antimicrobials:    Subjective: Pt c/o pelvic and wrist pain  Objective: Vitals:   06/15/23 1527 06/15/23 2033 06/16/23 0323 06/16/23 0822  BP: 102/70 106/70 127/66 (!) 149/75  Pulse: 75 79 80 79  Resp: 16 16 16 16   Temp: 98 F (36.7 C) 98.3 F (36.8 C) 99.3 F (37.4 C) 98.5 F (36.9 C)  TempSrc: Oral  Oral Oral  SpO2: 90% 91% 91% 91%  Weight:      Height:        Intake/Output Summary (Last 24 hours) at 06/16/2023 0832 Last  data filed at 06/16/2023 0328 Gross per 24 hour  Intake 360 ml  Output 300 ml  Net 60 ml   Filed Weights   06/13/23 2244  Weight: 70 kg    Examination:  General exam: Appears comfortable  Respiratory system: clear breath sounds b/l  Cardiovascular system: S1 & S2+. No rubs or gallops  Gastrointestinal system: abd is soft, NT, ND & hypoactive bowel sounds  Central nervous system: alert & awake. Moves all extremities  Psychiatry: judgement and insight appears at baseline. Flat mood and affect     Data Reviewed: I have personally reviewed following labs and imaging studies  CBC: Recent Labs  Lab 06/13/23 2126 06/15/23 0523 06/16/23 0433  WBC 8.4 6.1 6.1  HGB 11.8* 10.8* 10.9*  HCT 35.8* 32.8* 32.7*  MCV 94.7 92.1 92.6  PLT 216 187 177   Basic Metabolic Panel: Recent Labs  Lab 06/13/23 2126  NA 140  K 3.5  CL 105  CO2 25  GLUCOSE 153*  BUN 20  CREATININE 0.68  CALCIUM 8.3*   GFR: Estimated Creatinine Clearance: 50.8 mL/min (by C-G formula based on SCr of 0.68 mg/dL). Liver Function Tests: No results for input(s): "AST", "ALT", "ALKPHOS", "BILITOT", "PROT", "ALBUMIN" in the last 168 hours. No results for input(s): "LIPASE", "AMYLASE" in the last 168 hours. No results for input(s): "AMMONIA" in the last 168 hours. Coagulation Profile: Recent Labs  Lab 06/13/23 2221  INR 1.0   Cardiac Enzymes: No results for input(s): "CKTOTAL", "CKMB", "CKMBINDEX", "TROPONINI" in the last 168 hours. BNP (last 3 results) No results for input(s): "PROBNP" in the last 8760 hours. HbA1C: No results for input(s): "HGBA1C" in the last 72 hours. CBG: No results for input(s): "GLUCAP" in the last 168 hours. Lipid Profile: No results for input(s): "CHOL", "HDL", "LDLCALC", "TRIG", "CHOLHDL", "LDLDIRECT" in the last 72 hours. Thyroid Function Tests: No results for input(s): "TSH", "T4TOTAL", "FREET4", "T3FREE", "THYROIDAB" in the last 72 hours. Anemia Panel: No results for  input(s): "VITAMINB12", "FOLATE", "FERRITIN", "TIBC", "IRON", "RETICCTPCT" in the last 72 hours. Sepsis Labs: No results for input(s): "PROCALCITON", "LATICACIDVEN" in the last 168 hours.  No results found for this or any previous visit (from the past 240 hours).       Radiology Studies: No results found.       Scheduled Meds:  aspirin EC  81 mg Oral Q7 days   docusate sodium  200 mg Oral BID   enoxaparin (LOVENOX) injection  40 mg Subcutaneous Q24H   ferrous sulfate  325 mg Oral Q breakfast   lidocaine  1 patch Transdermal Q24H   pantoprazole  40 mg Oral BID AC   polyethylene glycol  17 g Oral Daily   spironolactone  25 mg Oral QODAY   Continuous Infusions:   LOS: 0 days       Charise Killian, MD [facility] Pager [phone]  If 7PM-7AM, please contact night-coverage [email or web address] 06/16/2023, 8:32 AM

## 2023-06-16 NOTE — Plan of Care (Signed)
  Problem: Clinical Measurements: Goal: Ability to maintain clinical measurements within normal limits will improve Outcome: Progressing Goal: Will remain free from infection Outcome: Progressing Goal: Diagnostic test results will improve Outcome: Progressing Goal: Respiratory complications will improve Outcome: Progressing Goal: Cardiovascular complication will be avoided Outcome: Progressing   Problem: Elimination: Goal: Will not experience complications related to bowel motility Outcome: Progressing Goal: Will not experience complications related to urinary retention Outcome: Progressing   Problem: Pain Managment: Goal: General experience of comfort will improve and/or be controlled Outcome: Progressing   Problem: Safety: Goal: Ability to remain free from injury will improve Outcome: Progressing

## 2023-06-16 NOTE — Progress Notes (Signed)
 Physical Therapy Treatment Patient Details Name: Sonya Park MRN: [id number] DOB: 11-Aug-1940 Today's Date: 06/16/2023   History of Present Illness Closed pelvic fracture of the left superior pubic ramus & minimally displaced fracture of the left inferior pubic ramus. WBAT of b/l LE as per ortho. Left wrist fracture: impacted fracture of the distal radial metaphysis & nondisplaced ulna styloid fracture. Non weight bearing of the left arm. Percocet, morphine prn    PT Comments  To EOB with mod a x 1  continues with difficulty bridging to get hips to EOB.  She stands with inc time and min a x 1 and increased time.  She is able to walk very slowly to/from bathroom with platform walker and min a x 1.  She is able to tolerate bathing on commode with tech assist.  Continues to progress with mobility but remains below baseline.  She is hesitant with sit to/from stand transitions due to discomfort and needs extensive cues for hand placements and not using LUE to assist with mobility.   If plan is discharge home, recommend the following: Assistance with cooking/housework;Help with stairs or ramp for entrance;Assist for transportation;A little help with walking and/or transfers;A little help with bathing/dressing/bathroom   Can travel by private vehicle     No  Equipment Recommendations       Recommendations for Other Services       Precautions / Restrictions Precautions Precautions: Fall Precaution/Restrictions Comments: L LE WBAT;  L wrist NWB Required Braces or Orthoses: Splint/Cast Restrictions LLE Weight Bearing Per Provider Order: Weight bearing as tolerated Other Position/Activity Restrictions: NWB L UE     Mobility  Bed Mobility Overal bed mobility: Needs Assistance Bed Mobility: Supine to Sit     Supine to sit: Mod assist       Patient Response: Cooperative  Transfers Overall transfer level: Needs assistance Equipment used: Left platform walker Transfers: Sit to/from  Stand Sit to Stand: Min assist                Ambulation/Gait Ambulation/Gait assistance: Contact guard assist, Min assist Gait Distance (Feet): 12 Feet Assistive device: Left platform walker Gait Pattern/deviations: Step-to pattern Gait velocity: significantly decreased     General Gait Details: to bathroom and back   Stairs             Wheelchair Mobility     Tilt Bed Tilt Bed Patient Response: Cooperative  Modified Rankin (Stroke Patients Only)       Balance Overall balance assessment: Needs assistance Sitting-balance support: No upper extremity supported Sitting balance-Leahy Scale: Fair     Standing balance support: Bilateral upper extremity supported Standing balance-Leahy Scale: Poor Standing balance comment: +1 at all times                            Communication Communication Communication: No apparent difficulties  Cognition Arousal: Alert Behavior During Therapy: WFL for tasks assessed/performed   PT - Cognitive impairments: No apparent impairments                                Cueing    Exercises Other Exercises Other Exercises: to bathroom to void    General Comments        Pertinent Vitals/Pain Pain Assessment Pain Assessment: Faces Faces Pain Scale: Hurts even more Pain Location: L LE with WB increased pain. Pain Descriptors / Indicators: Aching, Sore  Pain Intervention(s): Limited activity within patient's tolerance, Monitored during session, Premedicated before session, Repositioned    Home Living                          Prior Function            PT Goals (current goals can now be found in the care plan section) Progress towards PT goals: Progressing toward goals    Frequency    7X/week      PT Plan      Co-evaluation              AM-PAC PT "6 Clicks" Mobility   Outcome Measure  Help needed turning from your back to your side while in a flat bed without  using bedrails?: A Lot Help needed moving from lying on your back to sitting on the side of a flat bed without using bedrails?: A Lot Help needed moving to and from a bed to a chair (including a wheelchair)?: A Little Help needed standing up from a chair using your arms (e.g., wheelchair or bedside chair)?: A Little Help needed to walk in hospital room?: A Little Help needed climbing 3-5 steps with a railing? : Total 6 Click Score: 14    End of Session Equipment Utilized During Treatment: Gait belt Activity Tolerance: Patient limited by pain Patient left: with call bell/phone within reach;with family/visitor present;in chair;with chair alarm set Nurse Communication: Mobility status PT Visit Diagnosis: Unsteadiness on feet (R26.81);Other abnormalities of gait and mobility (R26.89);Repeated falls (R29.6);Muscle weakness (generalized) (M62.81);History of falling (Z91.81);Difficulty in walking, not elsewhere classified (R26.2)     Time: 1610-9604 PT Time Calculation (min) (ACUTE ONLY): 41 min  Charges:    $Gait Training: 38-52 mins PT General Charges $$ ACUTE PT VISIT: 1 Visit                   Danielle Dess, PTA 06/16/23, 12:52 PM

## 2023-06-16 NOTE — Progress Notes (Signed)
 Care assumed at 0032.  Pt resting calmly, awakens easily.  Denies pain when being still.  No changes noted in assessment from off going RN.  Fresh water supplied.  Call bell in reach. Hilton Sinclair BSN RN CMSRN 06/16/2023, 5:18 AM

## 2023-06-16 NOTE — [place] FL2 (Signed)
 [place] MEDICAID FL2 LEVEL OF CARE FORM     IDENTIFICATION  Patient Name: Sonya Park Birthdate: 10-31-1940 Sex: female Admission Date (Current Location): 06/13/2023  [facility] and [place] Number:      Facility and Address:  [facility], [address] [place], [place] [address]      Provider Number: [id number]  Attending Physician Name and Address:  Charise Killian, MD  Relative Name and Phone Number:  ELYSSIA STRAUSSER  Spouse  Emergency Contact  [phone]  [address]  [place] [address]    Current Level of Care: Hospital Recommended Level of Care: Skilled Nursing Facility Prior Approval Number:    Date Approved/Denied:   PASRR Number: [id number]  Discharge Plan: SNF    Current Diagnoses: Patient Active Problem List   Diagnosis Date Noted   Closed fracture of left distal radius 06/14/2023   Closed pelvic fracture (HCC) 06/13/2023   HTN (hypertension) 06/13/2023   Chronic diastolic CHF (congestive heart failure) (HCC) 06/13/2023   Wrist fracture, closed, left, initial encounter 06/13/2023   Vitamin D deficiency 04/12/2023   Hypocalcemia 04/12/2023   Iron deficiency anemia 04/16/2022   Closed displaced intertrochanteric fracture of right femur (HCC) 03/05/2022   Closed fracture of proximal end of right humerus 03/05/2022   Fracture of right hip (HCC) 02/03/2022   Shoulder fracture, right, closed, initial encounter 02/03/2022   Fall at home, initial encounter 02/03/2022   Neck pain 10/24/2021   Irritable bowel syndrome 10/14/2021   Obstructive sleep apnea of adult 10/14/2021   Vulvovaginitis 07/27/2021   Hypoglycemia 04/11/2021   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 01/16/2021   Vertigo 06/09/2020   Rotator cuff arthropathy of right shoulder 03/22/2020   Hyperlipidemia 02/21/2020   Need for COVID-19 vaccine 02/14/2020   Essential hypertension 09/02/2019    Seasonal allergic rhinitis due to pollen 09/02/2019   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) 11/17/2017   Lymphedema 08/11/2017   Anemia, macrocytic, nutritional    Macrocytic anemia    Dysphagia    Itching    Screening for colon cancer    LFT elevation    Severe malnutrition (HCC)    Adult failure to thrive    Hyperbilirubinemia    Alcoholic liver failure (HCC)    Thrombocytopenia (HCC)    Pressure injury of skin 01/19/2017   Bacteremia    Palliative care by specialist    Goals of care, counseling/discussion    Alcohol abuse 01/15/2017   Facial trauma 01/14/2017   Osteoporosis 01/14/2017   Neutropenia (HCC) 01/14/2017   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   Special screening for malignant neoplasms, colon    Benign neoplasm of transverse colon    Fracture of right inferior pubic ramus (HCC) 09/17/2014   Compression fracture of L3 vertebra (HCC) 09/17/2014   Closed wedge compression fracture of third lumbar vertebra (HCC) 09/17/2014   Other specified fracture of right pubis, initial encounter for closed fracture (HCC) 09/17/2014    Orientation RESPIRATION BLADDER Height & Weight     Self, Situation, Time, Place  Normal Continent Weight: 70 kg Height:  5' 5.98" (167.6 cm)  BEHAVIORAL SYMPTOMS/MOOD NEUROLOGICAL BOWEL NUTRITION STATUS      Continent Diet (regular)  AMBULATORY STATUS COMMUNICATION OF NEEDS Skin   Extensive Assist Verbally Normal  Personal Care Assistance Level of Assistance  Bathing, Feeding, Dressing Bathing Assistance: Limited assistance Feeding assistance: Limited assistance Dressing Assistance: Limited assistance     Functional Limitations Info  Sight, Hearing, Speech Sight Info: Adequate Hearing Info: Adequate Speech Info: Adequate    SPECIAL CARE FACTORS FREQUENCY                       Contractures Contractures Info: Not present    Additional Factors Info   Code Status, Allergies Code Status Info: full code Allergies Info: Chlorpheniramine, Penicillins, Other           Current Medications (06/16/2023):  This is the current hospital active medication list Current Facility-Administered Medications  Medication Dose Route Frequency Provider Last Rate Last Admin   acetaminophen (TYLENOL) tablet 650 mg  650 mg Oral Q6H PRN Lorretta Harp, MD       aspirin EC tablet 81 mg  81 mg Oral Q7 days Charise Killian, MD       docusate sodium (COLACE) capsule 200 mg  200 mg Oral BID Charise Killian, MD   200 mg at 06/16/23 0832   enoxaparin (LOVENOX) injection 40 mg  40 mg Subcutaneous Q24H Charise Killian, MD   40 mg at 06/16/23 1441   ferrous sulfate tablet 325 mg  325 mg Oral Q breakfast Lorretta Harp, MD   325 mg at 06/16/23 4098   HYDROmorphone (DILAUDID) injection 1 mg  1 mg Intravenous Q4H PRN Charise Killian, MD       lidocaine (LIDODERM) 5 % 1 patch  1 patch Transdermal Q24H Lorretta Harp, MD   1 patch at 06/15/23 2100   melatonin tablet 2.5 mg  2.5 mg Oral QHS PRN Lorretta Harp, MD   2.5 mg at 06/15/23 2057   methocarbamol (ROBAXIN) tablet 500 mg  500 mg Oral Q8H PRN Lorretta Harp, MD   500 mg at 06/14/23 1117   ondansetron (ZOFRAN) injection 4 mg  4 mg Intravenous Q8H PRN Lorretta Harp, MD   4 mg at 06/14/23 0807   oxyCODONE-acetaminophen (PERCOCET/ROXICET) 5-325 MG per tablet 2 tablet  2 tablet Oral Q4H PRN Charise Killian, MD   2 tablet at 06/16/23 1441   pantoprazole (PROTONIX) EC tablet 40 mg  40 mg Oral BID Langley Gauss, MD   40 mg at 06/16/23 1191   polyethylene glycol (MIRALAX / GLYCOLAX) packet 17 g  17 g Oral Daily Charise Killian, MD   17 g at 06/16/23 4782   spironolactone (ALDACTONE) tablet 25 mg  25 mg Oral Raeanne Gathers, MD   25 mg at 06/16/23 9562     Discharge Medications: Please see discharge summary for a list of discharge medications.  Relevant Imaging Results:  Relevant Lab Results:   Additional  Information ss: [id number]  Marlowe Sax, RN

## 2023-06-17 DIAGNOSIS — S329XXA Fracture of unspecified parts of lumbosacral spine and pelvis, initial encounter for closed fracture: Secondary | ICD-10-CM

## 2023-06-17 LAB — CBC
HCT: 34 % — ABNORMAL LOW (ref 36.0–46.0)
Hemoglobin: 11.4 g/dL — ABNORMAL LOW (ref 12.0–15.0)
MCH: 30.3 pg (ref 26.0–34.0)
MCHC: 33.5 g/dL (ref 30.0–36.0)
MCV: 90.4 fL (ref 80.0–100.0)
Platelets: 206 10*3/uL (ref 150–400)
RBC: 3.76 MIL/uL — ABNORMAL LOW (ref 3.87–5.11)
RDW: 13.6 % (ref 11.5–15.5)
WBC: 5.5 10*3/uL (ref 4.0–10.5)
nRBC: 0 % (ref 0.0–0.2)

## 2023-06-17 MED ORDER — BISACODYL 10 MG RE SUPP
10.0000 mg | Freq: Once | RECTAL | Status: AC
  Administered 2023-06-17: 10 mg via RECTAL
  Filled 2023-06-17: qty 1

## 2023-06-17 NOTE — Progress Notes (Signed)
 OT Cancellation Note  Patient Details Name: AVRI PAIVA MRN: [id number] DOB: [birth year]   Cancelled Treatment:    Reason Eval/Treat Not Completed: Other (comment). Pt unavailable upon attempt this afternoon. Will re-attempt OT tx next date as appropriate.   Arman Filter., MPH, MS, OTR/L ascom [phone] 06/17/23, 4:19 PM

## 2023-06-17 NOTE — TOC Progression Note (Signed)
 Transition of Care [facility]) - Progression Note    Patient Details  Name: Sonya Park MRN: [id number] Date of Birth: 05-08-1940  [facility] CM/SW Contact  Marlowe Sax, RN Phone Number: 06/17/2023, 1:21 PM  Clinical Narrative:     Met with the patient and reviewed the bedsearch with the patient, she asked that I speak to her husband and review when he gets here       Expected Discharge Plan and Services                                               Social Determinants of Health (SDOH) Interventions SDOH Screenings   Food Insecurity: No Food Insecurity (06/13/2023)  Housing: Low Risk  (06/13/2023)  Transportation Needs: No Transportation Needs (06/13/2023)  Utilities: Not At Risk (06/13/2023)  Alcohol Screen: Low Risk  (03/14/2022)  Depression (PHQ2-9): Low Risk  (04/11/2023)  Financial Resource Strain: Low Risk  (07/24/2022)   Received from [facility], [facility]  Physical Activity: Inactive (03/14/2022)  Social Connections: Moderately Isolated (06/13/2023)  Stress: Stress Concern Present (03/14/2022)  Tobacco Use: Medium Risk (06/13/2023)    Readmission Risk Interventions     No data to display

## 2023-06-17 NOTE — Plan of Care (Signed)
  Problem: Education: Goal: Knowledge of General Education information will improve Description: Including pain rating scale, medication(s)/side effects and non-pharmacologic comfort measures 06/17/2023 1957 by Frann Rider D, LPN Outcome: Progressing 06/17/2023 1955 by Frann Rider D, LPN Outcome: Progressing   Problem: Activity: Goal: Risk for activity intolerance will decrease 06/17/2023 1957 by Frann Rider D, LPN Outcome: Progressing 06/17/2023 1955 by Frann Rider D, LPN Outcome: Progressing   Problem: Nutrition: Goal: Adequate nutrition will be maintained 06/17/2023 1957 by Frann Rider D, LPN Outcome: Progressing 06/17/2023 1955 by Frann Rider D, LPN Outcome: Progressing   Problem: Safety: Goal: Ability to remain free from injury will improve 06/17/2023 1957 by Frann Rider D, LPN Outcome: Progressing 06/17/2023 1955 by Frann Rider D, LPN Outcome: Progressing   Problem: Skin Integrity: Goal: Risk for impaired skin integrity will decrease 06/17/2023 1957 by Frann Rider D, LPN Outcome: Progressing 06/17/2023 1955 by Jomarie Longs, LPN Outcome: Progressing

## 2023-06-17 NOTE — Progress Notes (Signed)
 PT Cancellation Note  Patient Details Name: Sonya Park MRN: [id number] DOB: 07-Nov-1940   Cancelled Treatment:    PT attempt. Pt was sitting on BSC having BM upon arrival. Acute PT will continue to follow and progress per current POC. Will return tomorrow to progress pt towards rehab goals.    Rushie Chestnut 06/17/2023, 3:18 PM

## 2023-06-17 NOTE — Plan of Care (Signed)

## 2023-06-17 NOTE — Progress Notes (Signed)
 PROGRESS NOTE   HPI was taken from Dr. Clyde Lundborg: Sonya Park is a 83 y.o. 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   Pt stats that she stumbled in the garage on the edge of a mattress or some chair, fell hard on her left hip around 3:30 PM.  She also injured her left wrist and left hip.  She developed bruise, pain and swelling in the left wrist.  She has pain in left hip and groin area, which is constant, sharp, severe, nonradiating, aggravated by movement. Patient strongly denies any head or neck injury.  No headache or neck pain.  She refused CT scan of head and neck. Patient does not have chest pain, cough, SOB.  No nausea, vomiting, diarrhea or abdominal pain.  No symptoms of UTI.  No fever or chills. Pt has also has lower back pain.   Data reviewed independently and ED Course: pt was found to have WBC 8.4, GFR> 60.  Temperature normal, blood pressure 115/90, heart rate 84, RR 26, oxygen saturation 100% on room air. Pt is placed in MedSurg bed for observation.  Consulted Dr. Audelia Acton of Ortho.    X-ray of left hip/pelvis: Mildly displaced fractures of the left superior and inferior pubic rami.   CT-Pelvis: 1. Mildly displaced and comminuted fracture of the left superior pubic ramus. Fracture extends to the pubic body. 2. Minimally displaced fracture of the left inferior pubic ramus.   X-ray of left  wrist: 1. Impacted fracture of the distal radial metaphysis. 2. Nondisplaced ulna styloid fracture.   X-ray of L spin:  1. Age indeterminate mild T12 inferior endplate compression fracture. 2. Chronic L3 compression fracture. 3. Scoliosis and mild degenerative change.   Sonya Park  WUJ:[id number] DOB: [birth year] DOA: 06/13/2023 PCP: Sallyanne Kuster, NP   Assessment & Plan:   Principal Problem:   Closed pelvic fracture [facility]) Active  Problems:   Wrist fracture, closed, left, initial encounter   Fall at home, initial encounter   HTN (hypertension)   Chronic diastolic CHF (congestive heart failure) (HCC)   Iron deficiency anemia   Closed fracture of left distal radius  Assessment and Plan: Closed pelvic fracture: of the left superior pubic ramus & minimally displaced fracture of the left inferior pubic ramus. WBAT of b/l LE as per ortho. Percocet, morphine prn   Left wrist fracture: impacted fracture of the distal radial metaphysis & nondisplaced ulna styloid fracture. Non weight bearing of left arm. Percocet, morphine prn    Fall: PT/OT recs SNF.  Waiting on SNF placement still    HTN: continue on home dose of aldactone    Chronic diastolic CHF: echo on 01/18/2017 showed EF of 45-50%. CHF appears compensated. Continue on home dose of aldactone. Monitor I/Os    IDA: continue on iron supplement         DVT prophylaxis: lovenox  Code Status: full  Family Communication:  Disposition Plan: likely d/c to SNF  Level of care: Med-Surg  Status is: Observation The patient remains OBS appropriate and will d/c before 2 midnights. Medically stable. Waiting on SNF placement still     Consultants:  Ortho surg   Procedures:   Antimicrobials:    Subjective: Pt c/o fatigue   Objective: Vitals:   06/16/23 1454 06/16/23 2104 06/17/23 0445 06/17/23 0737  BP: 109/64 120/71 (!) 144/66 138/65  Pulse: 83 81  76 75  Resp: 16 16 16 19   Temp: 97.8 F (36.6 C) 98.4 F (36.9 C) 98.4 F (36.9 C) 98.6 F (37 C)  TempSrc: Oral     SpO2: 94% 93% 91% 90%  Weight:      Height:        Intake/Output Summary (Last 24 hours) at 06/17/2023 0901 Last data filed at 06/16/2023 2100 Gross per 24 hour  Intake 360 ml  Output 700 ml  Net -340 ml   Filed Weights   06/13/23 2244  Weight: 70 kg    Examination:  General exam: Appears calm & comfortable  Respiratory system: clear breath sounds b/l  Cardiovascular system:  S1/S2+. No rubs or clicks  Gastrointestinal system: abd is soft, NT,  ND & moves all extremities  Central nervous system: alert & awake. Moves all extremities  Psychiatry: judgement and insight appears at baseline. Flat mood and affect     Data Reviewed: I have personally reviewed following labs and imaging studies  CBC: Recent Labs  Lab 06/13/23 2126 06/15/23 0523 06/16/23 0433 06/17/23 0513  WBC 8.4 6.1 6.1 5.5  HGB 11.8* 10.8* 10.9* 11.4*  HCT 35.8* 32.8* 32.7* 34.0*  MCV 94.7 92.1 92.6 90.4  PLT 216 187 177 206   Basic Metabolic Panel: Recent Labs  Lab 06/13/23 2126  NA 140  K 3.5  CL 105  CO2 25  GLUCOSE 153*  BUN 20  CREATININE 0.68  CALCIUM 8.3*   GFR: Estimated Creatinine Clearance: 50.8 mL/min (by C-G formula based on SCr of 0.68 mg/dL). Liver Function Tests: No results for input(s): "AST", "ALT", "ALKPHOS", "BILITOT", "PROT", "ALBUMIN" in the last 168 hours. No results for input(s): "LIPASE", "AMYLASE" in the last 168 hours. No results for input(s): "AMMONIA" in the last 168 hours. Coagulation Profile: Recent Labs  Lab 06/13/23 2221  INR 1.0   Cardiac Enzymes: No results for input(s): "CKTOTAL", "CKMB", "CKMBINDEX", "TROPONINI" in the last 168 hours. BNP (last 3 results) No results for input(s): "PROBNP" in the last 8760 hours. HbA1C: No results for input(s): "HGBA1C" in the last 72 hours. CBG: No results for input(s): "GLUCAP" in the last 168 hours. Lipid Profile: No results for input(s): "CHOL", "HDL", "LDLCALC", "TRIG", "CHOLHDL", "LDLDIRECT" in the last 72 hours. Thyroid Function Tests: No results for input(s): "TSH", "T4TOTAL", "FREET4", "T3FREE", "THYROIDAB" in the last 72 hours. Anemia Panel: No results for input(s): "VITAMINB12", "FOLATE", "FERRITIN", "TIBC", "IRON", "RETICCTPCT" in the last 72 hours. Sepsis Labs: No results for input(s): "PROCALCITON", "LATICACIDVEN" in the last 168 hours.  No results found for this or any previous  visit (from the past 240 hours).       Radiology Studies: No results found.       Scheduled Meds:  aspirin EC  81 mg Oral Q7 days   docusate sodium  200 mg Oral BID   enoxaparin (LOVENOX) injection  40 mg Subcutaneous Q24H   ferrous sulfate  325 mg Oral Q breakfast   lidocaine  1 patch Transdermal Q24H   pantoprazole  40 mg Oral BID AC   polyethylene glycol  17 g Oral Daily   spironolactone  25 mg Oral QODAY   Continuous Infusions:   LOS: 0 days       Charise Killian, MD [facility] Pager [phone]  If 7PM-7AM, please contact night-coverage [email or web address] 06/17/2023, 9:01 AM

## 2023-06-17 NOTE — Plan of Care (Signed)

## 2023-06-17 NOTE — Progress Notes (Signed)
 PT Cancellation Note  Patient Details Name: Sonya Park MRN: [id number] DOB: 12-21-1940   Cancelled Treatment:    Reason Eval/Treat Not Completed: Pain limiting ability to participate;Other (comment)  Multiple attempts this am.  Pt awaiting new IV site for pain meds to be administered and does not want to get up without them.  Pt is encouraged to get up with nursing staff when they are administered.  Will try to schedule with another therapist here this pm but if unable will continue therapy tomorrow.   Danielle Dess 06/17/2023, 1:28 PM

## 2023-06-18 DIAGNOSIS — S32409A Unspecified fracture of unspecified acetabulum, initial encounter for closed fracture: Secondary | ICD-10-CM | POA: Diagnosis not present

## 2023-06-18 LAB — CBC
HCT: 34.4 % — ABNORMAL LOW (ref 36.0–46.0)
Hemoglobin: 11.5 g/dL — ABNORMAL LOW (ref 12.0–15.0)
MCH: 30.7 pg (ref 26.0–34.0)
MCHC: 33.4 g/dL (ref 30.0–36.0)
MCV: 91.7 fL (ref 80.0–100.0)
Platelets: 216 10*3/uL (ref 150–400)
RBC: 3.75 MIL/uL — ABNORMAL LOW (ref 3.87–5.11)
RDW: 13.5 % (ref 11.5–15.5)
WBC: 5.8 10*3/uL (ref 4.0–10.5)
nRBC: 0 % (ref 0.0–0.2)

## 2023-06-18 MED ORDER — VITAMIN D (ERGOCALCIFEROL) 1.25 MG (50000 UNIT) PO CAPS
50000.0000 [IU] | ORAL_CAPSULE | ORAL | Status: DC
  Administered 2023-06-18: 50000 [IU] via ORAL
  Filled 2023-06-18: qty 1

## 2023-06-18 NOTE — Progress Notes (Signed)
 Physical Therapy Treatment Patient Details Name: Sonya Park MRN: [id number] DOB: January 16, 1941 Today's Date: 06/18/2023   History of Present Illness Closed pelvic fracture of the left superior pubic ramus & minimally displaced fracture of the left inferior pubic ramus. WBAT of b/l LE as per ortho. Left wrist fracture: impacted fracture of the distal radial metaphysis & nondisplaced ulna styloid fracture. Non weight bearing of the left arm. Percocet, morphine prn    PT Comments  Pt was long sitting in bed upon arrival. She is A and O x 4. Pt needs encouragement to participate and is overall pain limited. RN made aware of pt's request for pain medications. Overall, pt required mod assist to safely exit bed and stand. One in standing, CGA to ambulate short distance. Distance limited by pain. Pt extremely slow moving in wt bearing. Dc recs remain appropriate to maximize pt's safe functional mobility while assisting pt to PLOF.    If plan is discharge home, recommend the following: Assistance with cooking/housework;Help with stairs or ramp for entrance;Assist for transportation;A little help with walking and/or transfers;A little help with bathing/dressing/bathroom     Equipment Recommendations  Other (comment) (L platform RW)       Precautions / Restrictions Precautions Precautions: Fall Recall of Precautions/Restrictions: Intact Precaution/Restrictions Comments: L LE WBAT;  L wrist NWB Required Braces or Orthoses: Splint/Cast Restrictions Weight Bearing Restrictions Per Provider Order: Yes LLE Weight Bearing Per Provider Order: Weight bearing as tolerated Other Position/Activity Restrictions: NWB L UE     Mobility  Bed Mobility Overal bed mobility: Needs Assistance Bed Mobility: Supine to Sit  Supine to sit: Mod assist  General bed mobility comments: increased time due to pain. pt required constant encouragement    Transfers Overall transfer level: Needs assistance Equipment used:  Left platform walker Transfers: Sit to/from Stand Sit to Stand: Min assist  General transfer comment: Pt requires increased tiime to stand. vcs for fwd wt shift to to maintain NWB LUE. Bed height elevated    Ambulation/Gait Ambulation/Gait assistance: Contact guard assist, Min assist Gait Distance (Feet): 8 Feet Assistive device: Left platform walker Gait Pattern/deviations: Step-to pattern Gait velocity: significantly decreased  General Gait Details: pt requires increased time and vcs. pain limited overall. RN made aware of pt's request for pain meds.     Balance Overall balance assessment: Needs assistance Sitting-balance support: No upper extremity supported Sitting balance-Leahy Scale: Fair     Standing balance support: Bilateral upper extremity supported Standing balance-Leahy Scale: Good Standing balance comment: no LOB in standing with use of platform RW     Communication Communication Communication: No apparent difficulties  Cognition Arousal: Alert Behavior During Therapy: WFL for tasks assessed/performed   PT - Cognitive impairments: No apparent impairments    PT - Cognition Comments: Pt is A and O x 4 Following commands: Intact      Cueing Cueing Techniques: Verbal cues         Pertinent Vitals/Pain Pain Assessment Pain Assessment: 0-10 Pain Score: 8  Faces Pain Scale: Hurts whole lot Pain Location: L LE with WB increased pain. Pain Descriptors / Indicators: Aching, Sore Pain Intervention(s): Limited activity within patient's tolerance, Monitored during session, Repositioned     PT Goals (current goals can now be found in the care plan section) Acute Rehab PT Goals Patient Stated Goal: to go to rehab and get better. Progress towards PT goals: Progressing toward goals    Frequency    7X/week       AM-PAC PT "  6 Clicks" Mobility   Outcome Measure  Help needed turning from your back to your side while in a flat bed without using bedrails?: A  Lot Help needed moving from lying on your back to sitting on the side of a flat bed without using bedrails?: A Lot Help needed moving to and from a bed to a chair (including a wheelchair)?: A Little Help needed standing up from a chair using your arms (e.g., wheelchair or bedside chair)?: A Little Help needed to walk in hospital room?: A Little Help needed climbing 3-5 steps with a railing? : A Lot 6 Click Score: 15    End of Session   Activity Tolerance: Patient tolerated treatment well;Patient limited by pain Patient left: in chair;with call bell/phone within reach;with chair alarm set Nurse Communication: Mobility status PT Visit Diagnosis: Unsteadiness on feet (R26.81);Other abnormalities of gait and mobility (R26.89);Repeated falls (R29.6);Muscle weakness (generalized) (M62.81);History of falling (Z91.81);Difficulty in walking, not elsewhere classified (R26.2)     Time: 0812-0832 PT Time Calculation (min) (ACUTE ONLY): 20 min  Charges:    $Therapeutic Activity: 8-22 mins PT General Charges $$ ACUTE PT VISIT: 1 Visit                     Jetta Lout PTA 06/18/23, 9:13 AM

## 2023-06-18 NOTE — Progress Notes (Signed)
 PROGRESS NOTE   HPI was taken from Dr. Clyde Lundborg: Sonya Park is a 83 y.o. 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   Pt stats that she stumbled in the garage on the edge of a mattress or some chair, fell hard on her left hip around 3:30 PM.  She also injured her left wrist and left hip.  She developed bruise, pain and swelling in the left wrist.  She has pain in left hip and groin area, which is constant, sharp, severe, nonradiating, aggravated by movement. Patient strongly denies any head or neck injury.  No headache or neck pain.  She refused CT scan of head and neck. Patient does not have chest pain, cough, SOB.  No nausea, vomiting, diarrhea or abdominal pain.  No symptoms of UTI.  No fever or chills. Pt has also has lower back pain.   Data reviewed independently and ED Course: pt was found to have WBC 8.4, GFR> 60.  Temperature normal, blood pressure 115/90, heart rate 84, RR 26, oxygen saturation 100% on room air. Pt is placed in MedSurg bed for observation.  Consulted Dr. Audelia Acton of Ortho.    X-ray of left hip/pelvis: Mildly displaced fractures of the left superior and inferior pubic rami.   CT-Pelvis: 1. Mildly displaced and comminuted fracture of the left superior pubic ramus. Fracture extends to the pubic body. 2. Minimally displaced fracture of the left inferior pubic ramus.   X-ray of left  wrist: 1. Impacted fracture of the distal radial metaphysis. 2. Nondisplaced ulna styloid fracture.   X-ray of L spin:  1. Age indeterminate mild T12 inferior endplate compression fracture. 2. Chronic L3 compression fracture. 3. Scoliosis and mild degenerative change.   JEZABELLA SCHRIEVER  ZOX:[id number] DOB: [birth year] DOA: 06/13/2023 PCP: Sallyanne Kuster, NP   Assessment & Plan:   Principal Problem:   Closed pelvic fracture [facility]) Active  Problems:   Wrist fracture, closed, left, initial encounter   Fall at home, initial encounter   HTN (hypertension)   Chronic diastolic CHF (congestive heart failure) (HCC)   Iron deficiency anemia   Closed fracture of left distal radius  Assessment and Plan:  # Closed pelvic fracture: of the left superior pubic ramus & minimally displaced fracture of the left inferior pubic ramus. WBAT of b/l LE as per ortho. Percocet, morphine prn   # Left wrist fracture: impacted fracture of the distal radial metaphysis & nondisplaced ulna styloid fracture. Non weight bearing of left arm. Percocet, morphine prn    Fall: PT/OT recs SNF.  Waiting on SNF placement still    HTN: continue on home dose of aldactone    Chronic diastolic CHF: echo on 01/18/2017 showed EF of 45-50%. CHF appears compensated. Continue on home dose of aldactone. Monitor I/Os    IDA: continue on iron supplement    Vitamin D Insufficiency: started vitamin D 50,000 units p.o. weekly, follow with PCP to repeat vitamin D level after 3 to 6 months.   DVT prophylaxis: lovenox  Code Status: full  Family Communication:  Disposition Plan:  d/c to SNF, Tomorrow am  Level of care: Med-Surg  Status is: Observation The patient remains OBS appropriate and will d/c before 2 midnights. Medically stable. Waiting on SNF placement still     Consultants:  Ortho surg   Procedures:   Antimicrobials:    Subjective: No significant events  overnight, pain is under control.  Patient wanted to come into the clinic, denied any complaints.   Objective: Vitals:   06/18/23 0318 06/18/23 0811 06/18/23 1524 06/18/23 1551  BP: 100/69 135/73 (!) (P) 87/56 102/78  Pulse: 74 71 84   Resp: 18 16 18    Temp: 98.3 F (36.8 C) 98.3 F (36.8 C) 97.7 F (36.5 C)   TempSrc:      SpO2: 93% 93% 98%   Weight:      Height:        Intake/Output Summary (Last 24 hours) at 06/18/2023 1736 Last data filed at 06/18/2023 0900 Gross per 24 hour   Intake 240 ml  Output 750 ml  Net -510 ml   Filed Weights   06/13/23 2244  Weight: 70 kg    Examination:  General exam: Appears calm & comfortable  Respiratory system: clear breath sounds b/l  Cardiovascular system: S1/S2+. No rubs or clicks  Gastrointestinal system: abd is soft, NT,  ND & moves all extremities  Central nervous system: alert & awake. Moves all extremities  Psychiatry: judgement and insight appears at baseline. Flat mood and affect     Data Reviewed: I have personally reviewed following labs and imaging studies  CBC: Recent Labs  Lab 06/13/23 2126 06/15/23 0523 06/16/23 0433 06/17/23 0513 06/18/23 0505  WBC 8.4 6.1 6.1 5.5 5.8  HGB 11.8* 10.8* 10.9* 11.4* 11.5*  HCT 35.8* 32.8* 32.7* 34.0* 34.4*  MCV 94.7 92.1 92.6 90.4 91.7  PLT 216 187 177 206 216   Basic Metabolic Panel: Recent Labs  Lab 06/13/23 2126  NA 140  K 3.5  CL 105  CO2 25  GLUCOSE 153*  BUN 20  CREATININE 0.68  CALCIUM 8.3*   GFR: Estimated Creatinine Clearance: 50.8 mL/min (by C-G formula based on SCr of 0.68 mg/dL). Liver Function Tests: No results for input(s): "AST", "ALT", "ALKPHOS", "BILITOT", "PROT", "ALBUMIN" in the last 168 hours. No results for input(s): "LIPASE", "AMYLASE" in the last 168 hours. No results for input(s): "AMMONIA" in the last 168 hours. Coagulation Profile: Recent Labs  Lab 06/13/23 2221  INR 1.0   Cardiac Enzymes: No results for input(s): "CKTOTAL", "CKMB", "CKMBINDEX", "TROPONINI" in the last 168 hours. BNP (last 3 results) No results for input(s): "PROBNP" in the last 8760 hours. HbA1C: No results for input(s): "HGBA1C" in the last 72 hours. CBG: No results for input(s): "GLUCAP" in the last 168 hours. Lipid Profile: No results for input(s): "CHOL", "HDL", "LDLCALC", "TRIG", "CHOLHDL", "LDLDIRECT" in the last 72 hours. Thyroid Function Tests: No results for input(s): "TSH", "T4TOTAL", "FREET4", "T3FREE", "THYROIDAB" in the last 72  hours. Anemia Panel: No results for input(s): "VITAMINB12", "FOLATE", "FERRITIN", "TIBC", "IRON", "RETICCTPCT" in the last 72 hours. Sepsis Labs: No results for input(s): "PROCALCITON", "LATICACIDVEN" in the last 168 hours.  No results found for this or any previous visit (from the past 240 hours).       Radiology Studies: No results found.       Scheduled Meds:  aspirin EC  81 mg Oral Q7 days   docusate sodium  200 mg Oral BID   enoxaparin (LOVENOX) injection  40 mg Subcutaneous Q24H   ferrous sulfate  325 mg Oral Q breakfast   lidocaine  1 patch Transdermal Q24H   pantoprazole  40 mg Oral BID AC   polyethylene glycol  17 g Oral Daily   spironolactone  25 mg Oral QODAY   Vitamin D (Ergocalciferol)  50,000 Units Oral Q7 days  Continuous Infusions:   LOS: 0 days     Gillis Santa, MD [facility] Pager [phone]  If 7PM-7AM, please contact night-coverage [email or web address] 06/18/2023, 5:36 PM

## 2023-06-18 NOTE — Progress Notes (Signed)
 Occupational Therapy Treatment Patient Details Name: Sonya Park MRN: [id number] DOB: 1940/10/21 Today's Date: 06/18/2023   History of present illness Sonya Park is a 83 y.o. 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   Mildly displaced fractures of the left superior and inferior pubic rami and impacted fracture of the distal radial metaphysis & nondisplaced ulna styloid fracture - plan to treat nonsurgically.   OT comments  Ms Brotzman was seen for OT treatment on this date. Upon arrival to room pt seated in chair, agreeable to tx. Pt requires  MAX A don B socks in sitting. MIN A + platform RW for ADL t/f ~40 ft. Fair standing balance grooming with no UE support. Educated on HEP and DME recs. Pt making good progress toward goals, will continue to follow POC. Discharge recommendation remains appropriate.        If plan is discharge home, recommend the following:  A little help with walking and/or transfers;A lot of help with bathing/dressing/bathroom;Help with stairs or ramp for entrance   Equipment Recommendations  BSC/3in1;Other (comment) (platform walker attachment)    Recommendations for Other Services      Precautions / Restrictions Precautions Precautions: Fall Recall of Precautions/Restrictions: Intact Precaution/Restrictions Comments: L LE WBAT;  L wrist NWB Required Braces or Orthoses: Splint/Cast Restrictions Weight Bearing Restrictions Per Provider Order: Yes LLE Weight Bearing Per Provider Order: Weight bearing as tolerated Other Position/Activity Restrictions: NWB L UE       Mobility Bed Mobility               General bed mobility comments: received in chair    Transfers Overall transfer level: Needs assistance Equipment used: Left platform walker Transfers: Sit to/from Stand Sit to Stand: Min assist            General transfer comment: assist to place LUE in platform attachment, spouse assisted with sit<>stand     Balance Overall balance assessment: Needs assistance Sitting-balance support: No upper extremity supported Sitting balance-Leahy Scale: Fair     Standing balance support: No upper extremity supported, During functional activity Standing balance-Leahy Scale: Fair                             ADL either performed or assessed with clinical judgement   ADL Overall ADL's : Needs assistance/impaired                                       General ADL Comments: MAX A don B socks in sitting. MIN A + platform RW for ADL t/f ~40 ft.     Communication Communication Communication: No apparent difficulties   Cognition Arousal: Alert Behavior During Therapy: WFL for tasks assessed/performed Cognition: No apparent impairments                               Following commands: Intact        Cueing   Cueing Techniques: Verbal cues             Pertinent Vitals/ Pain       Pain Assessment Pain Assessment: Faces Faces Pain Scale: Hurts whole lot Pain Location: L LE with WB increased pain. Pain  Descriptors / Indicators: Aching, Sore Pain Intervention(s): Premedicated before session, Limited activity within patient's tolerance   Frequency  Min 2X/week        Progress Toward Goals  OT Goals(current goals can now be found in the care plan section)  Progress towards OT goals: Progressing toward goals  Acute Rehab OT Goals OT Goal Formulation: With patient Time For Goal Achievement: 06/28/23 Potential to Achieve Goals: Good ADL Goals Pt Will Perform Eating: with set-up;with min assist Pt Will Perform Upper Body Dressing: with min assist;sitting Pt Will Transfer to Toilet: with mod assist  Plan      Co-evaluation                 AM-PAC OT "6 Clicks" Daily Activity     Outcome Measure   Help from another  person eating meals?: None Help from another person taking care of personal grooming?: A Little Help from another person toileting, which includes using toliet, bedpan, or urinal?: A Lot Help from another person bathing (including washing, rinsing, drying)?: A Lot Help from another person to put on and taking off regular upper body clothing?: A Little Help from another person to put on and taking off regular lower body clothing?: A Lot 6 Click Score: 16    End of Session Equipment Utilized During Treatment: Rolling walker (2 wheels)  OT Visit Diagnosis: Unsteadiness on feet (R26.81);Muscle weakness (generalized) (M62.81);Pain;History of falling (Z91.81) Pain - Right/Left: Left Pain - part of body: Hip;Arm   Activity Tolerance Patient tolerated treatment well   Patient Left in chair;with call bell/phone within reach;with chair alarm set;with family/visitor present   Nurse Communication          Time: 4098-1191 OT Time Calculation (min): 32 min  Charges: OT General Charges $OT Visit: 1 Visit OT Treatments $Self Care/Home Management : 8-22 mins $Therapeutic Activity: 8-22 mins  Kathie Dike, M.S. OTR/L  06/18/23, 1:34 PM  ascom [phone]

## 2023-06-18 NOTE — Plan of Care (Signed)
   Problem: Education: Goal: Knowledge of General Education information will improve Description: Including pain rating scale, medication(s)/side effects and non-pharmacologic comfort measures Outcome: Progressing   Problem: Health Behavior/Discharge Planning: Goal: Ability to manage health-related needs will improve Outcome: Progressing   Problem: Clinical Measurements: Goal: Will remain free from infection Outcome: Progressing

## 2023-06-18 NOTE — TOC Progression Note (Addendum)
 Transition of Care [facility]) - Progression Note    Patient Details  Name: Sonya Park MRN: [id number] Date of Birth: [birth year]  [facility] of [facility]) CM/SW Contact  Marlowe Sax, RN Phone Number: 06/18/2023, 3:07 PM  Clinical Narrative:    Met with the patient and her husband in the room, I let them know that [facility] did offer a rehab bed, they accepted the bed offer Ins pending to go to [facility]  Approved Plan AuthID: [id number] 3/12-3/14/25 next review date:06/20/2023       Expected Discharge Plan and Services                                               Social Determinants of Health (SDOH) Interventions SDOH Screenings   Food Insecurity: No Food Insecurity (06/13/2023)  Housing: Low Risk  (06/13/2023)  Transportation Needs: No Transportation Needs (06/13/2023)  Utilities: Not At Risk (06/13/2023)  Alcohol Screen: Low Risk  (03/14/2022)  Depression (PHQ2-9): Low Risk  (04/11/2023)  Financial Resource Strain: Low Risk  (07/24/2022)   Received from [facility], [facility]  Physical Activity: Inactive (03/14/2022)  Social Connections: Moderately Isolated (06/13/2023)  Stress: Stress Concern Present (03/14/2022)  Tobacco Use: Medium Risk (06/13/2023)    Readmission Risk Interventions     No data to display

## 2023-06-19 DIAGNOSIS — S32409A Unspecified fracture of unspecified acetabulum, initial encounter for closed fracture: Secondary | ICD-10-CM | POA: Diagnosis not present

## 2023-06-19 LAB — TROPONIN I (HIGH SENSITIVITY)
Troponin I (High Sensitivity): 3 ng/L (ref <18)
Troponin I (High Sensitivity): 4 ng/L (ref <18)

## 2023-06-19 MED ORDER — ACETAMINOPHEN 325 MG PO TABS: 650.0000 mg | ORAL_TABLET | Freq: Four times a day (QID) | ORAL | Status: AC | PRN

## 2023-06-19 MED ORDER — OXYCODONE HCL 5 MG PO TABS: 5.0000 mg | ORAL_TABLET | ORAL | 0 refills | Status: DC | PRN

## 2023-06-19 MED ORDER — VITAMIN D (ERGOCALCIFEROL) 1.25 MG (50000 UNIT) PO CAPS: 50000.0000 [IU] | ORAL_CAPSULE | ORAL | Status: AC

## 2023-06-19 NOTE — Plan of Care (Signed)

## 2023-06-19 NOTE — Discharge Summary (Signed)
 [facility] Discharge Summary   Patient: Sonya Park:[id number]  PCP: Sallyanne Kuster, NP  Date of admission: 06/13/2023   Date of discharge:  06/19/2023     Discharge Diagnoses:  Principal Problem:   Closed pelvic fracture [facility]) Active Problems:   Wrist fracture, closed, left, initial encounter   Fall at home, initial encounter   HTN (hypertension)   Chronic diastolic CHF (congestive heart failure) (HCC)   Iron deficiency anemia   Closed fracture of left distal radius   Admitted From: Home Disposition:  SNF   Recommendations for Outpatient Follow-up:  F/u PCP, need to be seen by an MD in 1-2 days F/u Othro in 1 weeks Follow up LABS/TEST:  as above   Follow-up Information     Reinaldo Berber, MD Follow up in 1 week(s).   Specialty: Orthopedic Surgery Contact information: [address] [place] [address] [phone]                Diet recommendation: Cardiac diet  Activity: The patient is advised to gradually reintroduce usual activities, as tolerated  Discharge Condition: stable  Code Status: Full code   History of present illness: As per the H and P dictated on admission Hospital Course:   HPI was taken from Dr. Clyde Lundborg: Sonya Park is a 83 y.o. 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   Pt stats that she stumbled in the garage on the edge of a mattress or some chair, fell hard on her left hip around 3:30 PM.  She also injured her left wrist and left hip.  She developed bruise, pain and swelling in the left wrist.  She has pain in left hip and groin area, which is constant, sharp, severe, nonradiating, aggravated by movement. Patient strongly denies any head or neck injury.  No headache or neck pain.  She refused CT scan of head and neck. Patient does not have chest pain, cough, SOB.  No  nausea, vomiting, diarrhea or abdominal pain.  No symptoms of UTI.  No fever or chills. Pt has also has lower back pain.   Data reviewed independently and ED Course: pt was found to have WBC 8.4, GFR> 60.  Temperature normal, blood pressure 115/90, heart rate 84, RR 26, oxygen saturation 100% on room air. Pt is placed in MedSurg bed for observation.  Consulted Dr. Audelia Acton of Ortho.    X-ray of left hip/pelvis: Mildly displaced fractures of the left superior and inferior pubic rami.   CT-Pelvis: 1. Mildly displaced and comminuted fracture of the left superior pubic ramus. Fracture extends to the pubic body. 2. Minimally displaced fracture of the left inferior pubic ramus.   X-ray of left  wrist: 1. Impacted fracture of the distal radial metaphysis. 2. Nondisplaced ulna styloid fracture.   X-ray of L spin:  1. Age indeterminate mild T12 inferior endplate compression fracture. 2. Chronic L3 compression fracture. 3. Scoliosis and mild degenerative change.   Assessment and Plan:   # Closed pelvic fracture: of the left superior pubic ramus & minimally displaced fracture of the left inferior pubic ramus. WBAT of b/l LE as per ortho.  Continue Tylenol and oxycodone as needed for pain control.  Follow-up with orthopedic surgery in 1 week as an outpatient to repeat x-rays.   # Left wrist fracture: impacted fracture of the distal radial metaphysis & nondisplaced ulna  styloid fracture. Non weight bearing of left arm and continue brace.  Continue as needed meds for pain control  # HTN: continue on home dose of aldactone  # Chronic systolic CHF: echo on 01/18/2017 showed EF of 45-50%. CHF appears compensated. Continue on home dose of aldactone. # IDA: continue on iron supplement   # Vitamin D Insufficiency: started vitamin D 50,000 units p.o. weekly, follow with PCP to repeat vitamin D level after 3 to 6 months. # Fall: PT/OT recs SNF.  Continue physical therapy and continue fall precautions  Body  mass index is 24.92 kg/m.  Nutrition Interventions:  Pressure Injury 01/29/17 Stage II -  Partial thickness loss of dermis presenting as a shallow open ulcer with a red, pink wound bed without slough. (Active)  01/29/17 2311  Location: Buttocks  Location Orientation: Right;Medial  Staging: Stage II -  Partial thickness loss of dermis presenting as a shallow open ulcer with a red, pink wound bed without slough.  Wound Description (Comments):   Present on Admission:      Pressure Injury 01/29/17 Unstageable - Full thickness tissue loss in which the base of the ulcer is covered by slough (yellow, tan, gray, green or brown) and/or eschar (tan, brown or black) in the wound bed. two small areas on sacrum dark in color surrou (Active)  01/29/17 2310  Location: Sacrum  Location Orientation: Medial  Staging: Unstageable - Full thickness tissue loss in which the base of the ulcer is covered by slough (yellow, tan, gray, green or brown) and/or eschar (tan, brown or black) in the wound bed.  Wound Description (Comments): two small areas on sacrum dark in color surrounded by redness  Present on Admission:      Pressure Injury 02/02/17 Deep Tissue Injury - Purple or maroon localized area of discolored intact skin or blood-filled blister due to damage of underlying soft tissue from pressure and/or shear. (Active)  02/02/17 1400  Location: Heel  Location Orientation: Left  Staging: Deep Tissue Injury - Purple or maroon localized area of discolored intact skin or blood-filled blister due to damage of underlying soft tissue from pressure and/or shear.  Wound Description (Comments):   Present on Admission: No     Pain control  - [place] Controlled Substance Reporting System database could not be reviewed because website is not working.   -Oxycodone 10 tablets prescription printed out as per SNF requirement.  - Patient was instructed, not to drive, operate heavy machinery, perform activities at  heights, swimming or participation in water activities or provide baby sitting services while on Pain, Sleep and Anxiety Medications; until her outpatient Physician has advised to do so again.  - Also recommended to not to take more than prescribed Pain, Sleep and Anxiety Medications.  Patient was seen by physical therapy, who recommended Therapy, SNF placement, which was arranged. On the day of the discharge the patient's vitals were stable, and no other acute medical condition were reported by patient. the patient was felt safe to be discharge at [facility] for [facility] therapy.  Consultants: Orthopedic surgery Procedures: None  Discharge Exam: General: Appear in no distress, no Rash; Oral Mucosa Clear, moist. Cardiovascular: S1 and S2 Present, no Murmur, Respiratory: normal respiratory effort, Bilateral Air entry present and no Crackles, no wheezes Abdomen: Bowel Sound present, Soft and no tenderness, no hernia Extremities: no Pedal edema, no calf tenderness, left wrist brace intact Neurology: alert and oriented to time, place, and person affect appropriate.  Filed Weights   06/13/23  2244  Weight: 70 kg   Vitals:   06/19/23 0815 06/19/23 0831  BP:  (!) 153/95  Pulse: 74 81  Resp:  18  Temp:    SpO2: 94% 96%    DISCHARGE MEDICATION: Allergies as of 06/19/2023       Reactions   Chlorpheniramine    Penicillins Swelling   Has patient had a PCN reaction causing immediate rash, facial/tongue/throat swelling, SOB or lightheadedness with hypotension: Yes Has patient had a PCN reaction causing severe rash involving mucus membranes or skin necrosis: No Has patient had a PCN reaction that required hospitalization: No Has patient had a PCN reaction occurring within the last 10 years: No If all of the above answers are "NO", then may proceed with Cephalosporin use. Other reaction(s): SWELLING Other reaction(s): other   Other Itching, Other (See Comments)   "mycin" doesn't know which  one "mycin" doesn't know which one        Medication List     STOP taking these medications    b complex vitamins capsule   celecoxib 200 MG capsule Commonly known as: CELEBREX   Cholecalciferol 125 MCG (5000 UT) Tabs   Vitamin D 50 MCG (2000 UT) Caps       TAKE these medications    acetaminophen 325 MG tablet Commonly known as: TYLENOL Take 2 tablets (650 mg total) by mouth every 6 (six) hours as needed for mild pain (pain score 1-3) or fever.   aspirin 81 MG tablet Take 81 mg by mouth daily. Once a week (every Sunday)   B-12 3000 MCG Caps Take by mouth.   BARIATRIC MULTIVITAMINS/IRON PO Take 2 capsules by mouth every morning. After breakfast.   Biotin Extra Strength 10 MG Caps Generic drug: Biotin Take by mouth.   calcium citrate-vitamin D 315-200 MG-UNIT tablet Commonly known as: CITRACAL+D Take by mouth.   Copper Gluconate 2 MG Caps Take 4 mg by mouth daily.   FeroSul 325 (65 Fe) MG tablet Generic drug: ferrous sulfate TAKE 1 TABLET DAILY   melatonin 5 MG Tabs Take 2.5 mg by mouth. As needed   oxyCODONE 5 MG immediate release tablet Commonly known as: Oxy IR/ROXICODONE Take 1 tablet (5 mg total) by mouth every 4 (four) hours as needed for severe pain (pain score 7-10).   pantoprazole 40 MG tablet Commonly known as: PROTONIX Take 1 tablet (40 mg total) by mouth 2 (two) times daily before a meal.   PreviDent 5000 Sensitive 1.1-5 % Gel Generic drug: Sod Fluoride-Potassium Nitrate Place 1 Application onto teeth as directed.   Prolia 60 MG/ML Sosy injection Generic drug: denosumab Inject 60 mg into the skin every 6 (six) months.   spironolactone 25 MG tablet Commonly known as: ALDACTONE Take 1 tablet (25 mg total) by mouth daily. What changed: when to take this   thiamine 100 MG tablet Commonly known as: VITAMIN B1 Take 1 tablet (100 mg total) daily by mouth.   Vitamin D (Ergocalciferol) 1.25 MG (50000 UNIT) Caps capsule Commonly known  as: DRISDOL Take 1 capsule (50,000 Units total) by mouth every 7 (seven) days. Start taking on: June 25, 2023       Allergies  Allergen Reactions   Chlorpheniramine    Penicillins Swelling    Has patient had a PCN reaction causing immediate rash, facial/tongue/throat swelling, SOB or lightheadedness with hypotension: Yes Has patient had a PCN reaction causing severe rash involving mucus membranes or skin necrosis: No Has patient had a PCN reaction that required  hospitalization: No Has patient had a PCN reaction occurring within the last 10 years: No If all of the above answers are "NO", then may proceed with Cephalosporin use. Other reaction(s): SWELLING Other reaction(s): other   Other Itching and Other (See Comments)    "mycin" doesn't know which one "mycin" doesn't know which one   Discharge Instructions     Call MD for:  difficulty breathing, headache or visual disturbances   Complete by: As directed    Call MD for:  extreme fatigue   Complete by: As directed    Call MD for:  persistant dizziness or light-headedness   Complete by: As directed    Call MD for:  persistant nausea and vomiting   Complete by: As directed    Call MD for:  severe uncontrolled pain   Complete by: As directed    Call MD for:  temperature >100.4   Complete by: As directed    Diet - low sodium heart healthy   Complete by: As directed    Discharge instructions   Complete by: As directed    F/u PCP, need to be seen by an MD in 1-2 days F/u Othro in 1 weeks   Increase activity slowly   Complete by: As directed        The results of significant diagnostics from this hospitalization (including imaging, microbiology, ancillary and laboratory) are listed below for reference.    Significant Diagnostic Studies: DG Lumbar Spine 2-3 Views Result Date: 06/13/2023 CLINICAL DATA:  Low back pain.  Fall. EXAM: LUMBAR SPINE - 2-3 VIEW COMPARISON:  Radiograph and CT 09/17/2014 FINDINGS: There are 6  non-rib-bearing lumbar vertebra. The lower most lumbar vertebra will be labeled L5. Broad-based dextroscoliotic curvature is increased from prior exam. Remote L3 compression fracture without progressive loss of height from prior. There is a mild inferior endplate compression deformity of T12 (no ribs seen at this level) that is age indeterminate but new from 2016. Minor degenerative change for age with anterior spurring. The bones are subjectively under mineralized. IMPRESSION: 1. Age indeterminate mild T12 inferior endplate compression fracture. 2. Chronic L3 compression fracture. 3. Scoliosis and mild degenerative change. Electronically Signed   By: Narda Rutherford M.D.   On: 06/13/2023 21:40   DG Wrist Complete Left Result Date: 06/13/2023 CLINICAL DATA:  Pain after fall. EXAM: LEFT WRIST - COMPLETE 3+ VIEW COMPARISON:  None Available. FINDINGS: Impacted fracture of the distal radial metaphysis. No convincing intra-articular extension. Nondisplaced ulna styloid fracture. No dislocation. 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 Soft tissue edema is seen at the fracture site. IMPRESSION: 1. Impacted fracture of the distal radial metaphysis. 2. Nondisplaced ulna styloid fracture. Electronically Signed   By: Narda Rutherford M.D.   On: 06/13/2023 21:37   CT PELVIS WO CONTRAST Result Date: 06/13/2023 CLINICAL DATA:  Hip trauma, fracture suspected, no prior imaging eval for hip fracture on LEFT EXAM: CT PELVIS WITHOUT CONTRAST TECHNIQUE: Multidetector CT imaging of the pelvis was performed following the standard protocol without intravenous contrast. RADIATION DOSE REDUCTION: This exam was performed according to the departmental dose-optimization program which includes automated exposure control, adjustment of the mA and/or kV according to patient size and/or use of iterative reconstruction technique. COMPARISON:  Radiograph earlier today  FINDINGS: Bones/Joint/Cartilage Mildly displaced and comminuted fracture of the left superior pubic ramus. Fracture extends to the pubic body. Minimally displaced fracture of the left inferior pubic ramus. Left proximal femur is intact.  There is no hip dislocation. No sacral fracture. No pubic symphyseal or sacroiliac diastasis. Hardware in the right proximal femur is intact were visualized. Previous proximal femur fracture has healed. Ligaments Suboptimally assessed by CT. Muscles and Tendons No definite intramuscular hemorrhage. Soft tissues Vascular calcifications in the pelvis. Enteric sutures in the colon. No confluent soft tissue hematoma. IMPRESSION: 1. Mildly displaced and comminuted fracture of the left superior pubic ramus. Fracture extends to the pubic body. 2. Minimally displaced fracture of the left inferior pubic ramus. Electronically Signed   By: Narda Rutherford M.D.   On: 06/13/2023 19:34   DG Hip Unilat W or Wo Pelvis 2-3 Views Left Result Date: 06/13/2023 CLINICAL DATA:  Left-sided pelvic pain after fall. EXAM: DG HIP (WITH OR WITHOUT PELVIS) 2-3V LEFT COMPARISON:  None available. FINDINGS: Mildly displaced fractures of the left superior and inferior pubic rami. The superior ramus fracture extends to the pubic body. No involvement of the pubic symphysis. No additional fracture of the pelvis or left hip. Right proximal femur hardware is intact were visualized traversing remote proximal femur fracture. No pubic symphyseal or sacroiliac diastasis. IMPRESSION: Mildly displaced fractures of the left superior and inferior pubic rami. Electronically Signed   By: Narda Rutherford M.D.   On: 06/13/2023 19:31   DG Knee Complete 4 Views Left Result Date: 06/02/2023 CLINICAL DATA:  Injury calming down stairs 3 hours ago, difficulty bearing weight EXAM: LEFT KNEE - COMPLETE 4+ VIEW COMPARISON:  None Available. FINDINGS: Frontal, bilateral oblique, and lateral views of the left knee are obtained. 3  component left knee arthroplasty identified in the expected position without evidence of acute complication. There are no acute displaced fractures. No significant joint effusion. The soft tissues are unremarkable. IMPRESSION: 1. Unremarkable 3 component left knee arthroplasty. No evidence of acute fracture. Electronically Signed   By: Sharlet Salina M.D.   On: 06/02/2023 17:49    Microbiology: No results found for this or any previous visit (from the past 240 hours).   Labs: CBC: Recent Labs  Lab 06/13/23 2126 06/15/23 0523 06/16/23 0433 06/17/23 0513 06/18/23 0505  WBC 8.4 6.1 6.1 5.5 5.8  HGB 11.8* 10.8* 10.9* 11.4* 11.5*  HCT 35.8* 32.8* 32.7* 34.0* 34.4*  MCV 94.7 92.1 92.6 90.4 91.7  PLT 216 187 177 206 216   Basic Metabolic Panel: Recent Labs  Lab 06/13/23 2126  NA 140  K 3.5  CL 105  CO2 25  GLUCOSE 153*  BUN 20  CREATININE 0.68  CALCIUM 8.3*   Liver Function Tests: No results for input(s): "AST", "ALT", "ALKPHOS", "BILITOT", "PROT", "ALBUMIN" in the last 168 hours. No results for input(s): "LIPASE", "AMYLASE" in the last 168 hours. No results for input(s): "AMMONIA" in the last 168 hours. Cardiac Enzymes: No results for input(s): "CKTOTAL", "CKMB", "CKMBINDEX", "TROPONINI" in the last 168 hours. BNP (last 3 results) Recent Labs    06/13/23 2126  BNP 153.7*   CBG: No results for input(s): "GLUCAP" in the last 168 hours.  Time spent: 35 minutes  Signed:  Gillis Santa  [facility] 06/19/2023 12:11 PM

## 2023-06-19 NOTE — Progress Notes (Signed)
 Gave report to Reggie, LPN at [facility] and reviewed discharge instructions with patient. Reggie, LPN and patient acknowledged understanding. Patient discharged with personal belongings. Patient wheeled out by staff. No distress noted in patient. Patient transported to [facility] via family vehicle.

## 2023-06-24 ENCOUNTER — Other Ambulatory Visit: Payer: Self-pay | Admitting: Nurse Practitioner

## 2023-06-24 MED ORDER — OXYCODONE HCL 5 MG PO TABS: 5.0000 mg | ORAL_TABLET | ORAL | 0 refills | Status: DC | PRN

## 2023-06-24 NOTE — Progress Notes (Signed)
 Patient notified

## 2023-06-24 NOTE — Progress Notes (Unsigned)
 Recent discharge from hospital with complex pelvic fracture, having difficulty walking due to severe pain, pain medication refill ordered

## 2023-06-25 ENCOUNTER — Encounter: Payer: Self-pay | Admitting: Nurse Practitioner

## 2023-06-25 ENCOUNTER — Ambulatory Visit (INDEPENDENT_AMBULATORY_CARE_PROVIDER_SITE_OTHER): Admitting: Nurse Practitioner

## 2023-06-25 ENCOUNTER — Telehealth: Payer: Self-pay

## 2023-06-25 VITALS — BP 136/81 | HR 86 | Temp 98.3°F | Resp 16 | Ht 66.0 in

## 2023-06-25 DIAGNOSIS — R2689 Other abnormalities of gait and mobility: Secondary | ICD-10-CM | POA: Diagnosis not present

## 2023-06-25 DIAGNOSIS — S52592G Other fractures of lower end of left radius, subsequent encounter for closed fracture with delayed healing: Secondary | ICD-10-CM

## 2023-06-25 DIAGNOSIS — S32512G Fracture of superior rim of left pubis, subsequent encounter for fracture with delayed healing: Secondary | ICD-10-CM | POA: Diagnosis not present

## 2023-06-25 DIAGNOSIS — I5032 Chronic diastolic (congestive) heart failure: Secondary | ICD-10-CM | POA: Diagnosis not present

## 2023-06-25 DIAGNOSIS — S32592A Other specified fracture of left pubis, initial encounter for closed fracture: Secondary | ICD-10-CM | POA: Insufficient documentation

## 2023-06-25 DIAGNOSIS — R531 Weakness: Secondary | ICD-10-CM | POA: Diagnosis not present

## 2023-06-25 DIAGNOSIS — S32592G Other specified fracture of left pubis, subsequent encounter for fracture with delayed healing: Secondary | ICD-10-CM

## 2023-06-25 NOTE — Telephone Encounter (Signed)
 Sent message to [facility] for home health nursing and physical therapy

## 2023-06-25 NOTE — Progress Notes (Signed)
 [facility] [address] [place] [address] [phone]                                   Trans[facility]   [facility] Discharge Acute Issues Care Follow Up                                                                        Patient Demographics  Sonya Park, is a 83 y.o. female  DOB [birth year]  MRN [id number].  Primary MD  Sallyanne Kuster, NP  Admit date: 06/13/2023 Discharge date: 06/19/2023  Reason for TCC follow Up - Closed pelvic fracture of the superior pubic ramus and inferior pubic ramos, left Wrist fracture of distal radius and ulnar styloid, closed, left, Fall at home, Closed fracture of left distal radius    Past Medical History:  Diagnosis Date   Arthritis    Back pain    Insomnia    Medical history non-contributory    Osteoporosis     Past Surgical History:  Procedure Laterality Date   ABDOMINAL HYSTERECTOMY     BREAST LUMPECTOMY Right    COLONOSCOPY WITH PROPOFOL N/A 07/18/2015   Procedure: COLONOSCOPY WITH PROPOFOL;  Surgeon: Midge Minium, MD;  Location: [facility] ENDOSCOPY;  Service: Endoscopy;  Laterality: N/A;   COSMETIC SURGERY     GASTRIC BYPASS     X2   HEMORROIDECTOMY     KNEE ARTHROPLASTY Right 12/13/2015   Procedure: COMPUTER ASSISTED TOTAL KNEE ARTHROPLASTY;  Surgeon: Donato Heinz, MD;  Location: [facility] ORS;  Service: Orthopedics;  Laterality: Right;   KNEE ARTHROSCOPY     REPLACEMENT TOTAL KNEE Left    SHOULDER SURGERY Right        Recent HPI and Hospital Course  History of present illness: As per the H and P dictated on admission Hospital Course:    HPI was taken from Dr. Clyde Lundborg: Sonya Park is a 83 y.o. 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   Pt stats that she stumbled in the garage on the edge of a mattress or some chair,  fell hard on her left hip around 3:30 PM.  She also injured her left wrist and left hip.  She developed bruise, pain and swelling in the left wrist.  She has pain in left hip and groin area, which is constant, sharp, severe, nonradiating, aggravated by movement. Patient strongly denies any head or neck injury.  No headache or neck pain.  She refused CT scan of head and neck. Patient does not have chest pain, cough, SOB.  No nausea, vomiting, diarrhea or abdominal pain.  No symptoms of UTI.  No fever or chills. Pt has also has lower back pain.   Data reviewed independently and ED Course: pt was found to have WBC 8.4, GFR> 60.  Temperature normal, blood pressure 115/90, heart rate 84, RR 26, oxygen saturation 100% on room air. Pt is placed in MedSurg bed for observation.  Consulted Dr. Audelia Acton of Ortho.  X-ray of left hip/pelvis: Mildly displaced fractures of the left superior and inferior pubic rami.   CT-Pelvis: 1. Mildly displaced and comminuted fracture of the left superior pubic ramus. Fracture extends to the pubic body. 2. Minimally displaced fracture of the left inferior pubic ramus.   X-ray of left  wrist: 1. Impacted fracture of the distal radial metaphysis. 2. Nondisplaced ulna styloid fracture.   X-ray of L spin:  1. Age indeterminate mild T12 inferior endplate compression fracture. 2. Chronic L3 compression fracture. 3. Scoliosis and mild degenerative change.   Assessment and Plan:   # Closed pelvic fracture: of the left superior pubic ramus & minimally displaced fracture of the left inferior pubic ramus. WBAT of b/l LE as per ortho.  Continue Tylenol and oxycodone as needed for pain control.  Follow-up with orthopedic surgery in 1 week as an outpatient to repeat x-rays.   # Left wrist fracture: impacted fracture of the distal radial metaphysis & nondisplaced ulna styloid fracture. Non weight bearing of left arm and continue brace.  Continue as needed meds for pain control  #  HTN: continue on home dose of aldactone  # Chronic systolic CHF: echo on 01/18/2017 showed EF of 45-50%. CHF appears compensated. Continue on home dose of aldactone. # IDA: continue on iron supplement   # Vitamin D Insufficiency: started vitamin D 50,000 units p.o. weekly, follow with PCP to repeat vitamin D level after 3 to 6 months. # Fall: PT/OT recs SNF.  Continue physical therapy and continue fall precautions  Post Hospital Acute Care Issue to be followed in the Clinic   Principal Problem:   Closed pelvic fracture [facility]) Active Problems:   Wrist fracture, closed, left, initial encounter   Fall at home, initial encounter   HTN (hypertension)   Chronic diastolic CHF (congestive heart failure) (HCC)   Iron deficiency anemia   Closed fracture of left distal radius   Subjective:   Presli Giammona today has, No headache, No chest pain, No abdominal pain - No Nausea, No new weakness tingling or numbness, No Cough - SOB. Abnormal gait, poor mobility, poor balance, generalized weakness, pelvic bone pain, pain with walking, mild leg swelling. Activity intolerance due to weakness and pain.  Assessment & Plan    1. Closed fracture of superior ramus of left pubis with delayed healing, subsequent encounter (Primary) Has scheduled appt with orthopedic surgery tomorrow Restrictive weight bearing movement/walking Home health physical therapy ordered to start tomorrow or ASAP for early movement and weight bearing. Need evaluation, stretches exercises and eventually work on strength and endurance.  Has a walker, depending on her left forearm fractures, may need an adaptive add-on device for safe mobility which will be discussed with orthopedic at her appt tomorrow.  Plan to move from home health physical therapy to in-office physical therapy once she is more mobile  - Ambulatory referral to Home Health  2. Closed fracture of left inferior pubic ramus with delayed healing, subsequent encounter See  problem #1 - Ambulatory referral to Home Health  3. Chronic diastolic CHF (congestive heart failure) (HCC) Last echo was in 2018, need a repeat echo. -- prior results include diastolic dysfunction, atrial enlargement, valve regurgitation, LVH and preserved ejection fraction. BNP level was elevated while in the hospital.  --will start daily weighing and recording weight once she is able to move around easier.  --continue spironolactone 25 mg every other day. Increase to daily if increased lower extremity swelling is noted.  - ECHOCARDIOGRAM COMPLETE; Future  4. Other closed fracture of distal end of left radius with delayed healing, subsequent encounter Home health physical therapy ordered with plans to move to in-office physical therapy once she is more mobile.  Use walker with/without adaptive add-on for left forearm fracture if recommended by orthopedic surgery.  Go to appointment with orthopedic surgery tomorrow.  Recommend early movement and restrictive weight bearing along with following recommendations from orthopedic surgery  - Ambulatory referral to Home Health  5. Generalized weakness Home health physical therapy ordered with plans to move to in-office physical therapy once she is more mobile.  Use walker with/without adaptive add-on for left forearm fracture if recommended by orthopedic surgery.  - Ambulatory referral to Home Health  6. Impaired gait and mobility Home health physical therapy ordered with plans to move to in-office physical therapy once she is more mobile.  Use walker with/without adaptive add-on for left forearm fracture if recommended by orthopedic surgery.  - Ambulatory referral to Home Health   Reason for frequent admissions/ER visits **      Objective:   Vitals:   06/25/23 1123  BP: 136/81  Pulse: 86  Resp: 16  Temp: 98.3 F (36.8 C)  SpO2: 97%  Height: 5\' 6"  (1.676 m)    Wt Readings from Last 3 Encounters:  06/13/23 154 lb 5.2 oz (70 kg)   04/25/23 143 lb (64.9 kg)  04/25/23 146 lb 12.8 oz (66.6 kg)    Allergies as of 06/25/2023       Reactions   Chlorpheniramine    Penicillins Swelling   Has patient had a PCN reaction causing immediate rash, facial/tongue/throat swelling, SOB or lightheadedness with hypotension: Yes Has patient had a PCN reaction causing severe rash involving mucus membranes or skin necrosis: No Has patient had a PCN reaction that required hospitalization: No Has patient had a PCN reaction occurring within the last 10 years: No If all of the above answers are "NO", then may proceed with Cephalosporin use. Other reaction(s): SWELLING Other reaction(s): other   Other Itching, Other (See Comments)   "mycin" doesn't know which one "mycin" doesn't know which one        Medication List        Accurate as of June 25, 2023  1:33 PM. If you have any questions, ask your nurse or doctor.          acetaminophen 325 MG tablet Commonly known as: TYLENOL Take 2 tablets (650 mg total) by mouth every 6 (six) hours as needed for mild pain (pain score 1-3) or fever.   aspirin 81 MG tablet Take 81 mg by mouth daily. Once a week (every Sunday)   B-12 3000 MCG Caps Take by mouth.   BARIATRIC MULTIVITAMINS/IRON PO Take 2 capsules by mouth every morning. After breakfast.   Biotin Extra Strength 10 MG Caps Generic drug: Biotin Take by mouth.   calcium citrate-vitamin D 315-200 MG-UNIT tablet Commonly known as: CITRACAL+D Take by mouth.   Copper Gluconate 2 MG Caps Take 4 mg by mouth daily.   FeroSul 325 (65 Fe) MG tablet Generic drug: ferrous sulfate TAKE 1 TABLET DAILY   melatonin 5 MG Tabs Take 2.5 mg by mouth. As needed   oxyCODONE 5 MG immediate release tablet Commonly known as: Oxy IR/ROXICODONE Take 1 tablet (5 mg total) by mouth every 4 (four) hours as needed for severe pain (pain score 7-10).   pantoprazole 40 MG tablet Commonly known as: PROTONIX Take 1 tablet (40 mg total) by  mouth 2 (two) times daily before a meal.   PreviDent 5000 Sensitive 1.1-5 % Gel Generic drug: Sod Fluoride-Potassium Nitrate Place 1 Application onto teeth as directed.   Prolia 60 MG/ML Sosy injection Generic drug: denosumab Inject 60 mg into the skin every 6 (six) months.   spironolactone 25 MG tablet Commonly known as: ALDACTONE Take 1 tablet (25 mg total) by mouth daily. What changed: when to take this   thiamine 100 MG tablet Commonly known as: VITAMIN B1 Take 1 tablet (100 mg total) daily by mouth.   Vitamin D (Ergocalciferol) 1.25 MG (50000 UNIT) Caps capsule Commonly known as: DRISDOL Take 1 capsule (50,000 Units total) by mouth every 7 (seven) days.         Physical Exam: Constitutional: Patient appears well-developed and well-nourished. Not in obvious distress. HENT: Normocephalic, atraumatic, External right and left ear normal. Oropharynx is clear and moist.  Eyes: Conjunctivae and EOM are normal. PERRLA, no scleral icterus. Neck: Normal ROM. Neck supple. No JVD. No tracheal deviation. No thyromegaly. CVS: RRR, S1/S2 +, no murmurs, no gallops, no carotid bruit. No leg swelling today Pulmonary: Effort and breath sounds normal, no stridor, rhonchi, wheezes, rales.  Abdominal: Soft, no distension, tenderness, rebound or guarding.  Musculoskeletal: sitting in wheelchair today, weight bearing ambulation is painful slow and difficult due to displaced pelvic fractures x2, left forearm in splint due to radial and ulnar fractures.  Lymphadenopathy: No lymphadenopathy noted, cervical, inguinal or axillary Neuro: Alert and oriented  Skin: Skin is warm and dry. No rash noted. Not diaphoretic. No erythema. No pallor. Psychiatric: Normal mood and affect. Behavior, judgment, thought content normal.   Data Review   Micro Results No results found for this or any previous visit (from the past 240 hours).   CBC No results for input(s): "WBC", "HGB", "HCT", "PLT", "MCV",  "MCH", "MCHC", "RDW", "LYMPHSABS", "MONOABS", "EOSABS", "BASOSABS", "BANDABS" in the last 168 hours.  Invalid input(s): "NEUTRABS", "BANDSABD"  Chemistries  No results for input(s): "NA", "K", "CL", "CO2", "GLUCOSE", "BUN", "CREATININE", "CALCIUM", "MG", "AST", "ALT", "ALKPHOS", "BILITOT" in the last 168 hours.  Invalid input(s): "GFRCGP" ------------------------------------------------------------------------------------------------------------------ estimated creatinine clearance is 50.8 mL/min (by C-G formula based on SCr of 0.68 mg/dL). ------------------------------------------------------------------------------------------------------------------ No results for input(s): "HGBA1C" in the last 72 hours. ------------------------------------------------------------------------------------------------------------------ No results for input(s): "CHOL", "HDL", "LDLCALC", "TRIG", "CHOLHDL", "LDLDIRECT" in the last 72 hours. ------------------------------------------------------------------------------------------------------------------ No results for input(s): "TSH", "T4TOTAL", "T3FREE", "THYROIDAB" in the last 72 hours.  Invalid input(s): "FREET3" ------------------------------------------------------------------------------------------------------------------ No results for input(s): "VITAMINB12", "FOLATE", "FERRITIN", "TIBC", "IRON", "RETICCTPCT" in the last 72 hours.  Coagulation profile No results for input(s): "INR", "PROTIME" in the last 168 hours.  No results for input(s): "DDIMER" in the last 72 hours.  Cardiac Enzymes No results for input(s): "CKMB", "TROPONINI", "MYOGLOBIN" in the last 168 hours.  Invalid input(s): "CK" ------------------------------------------------------------------------------------------------------------------ Invalid input(s): "POCBNP"  Return for next visit in july, will discuss echo then if done in june .   Time Spent in minutes  45 Time spent  with patient included reviewing progress notes, labs, imaging studies, and discussing plan for follow up.   This patient was seen by Sallyanne Kuster, FNP-C in collaboration with Dr. Beverely Risen as a part of collaborative care agreement.    Sallyanne Kuster MSN, FNP-C on 06/25/2023 at 1:33 PM   **Disclaimer: This note may have been dictated with voice recognition software. Similar sounding words can inadvertently be transcribed and this note may contain transcription errors which may not have been corrected upon publication  of note.**

## 2023-06-26 NOTE — Addendum Note (Signed)
 Addended by: Sallyanne Kuster on: 06/26/2023 09:05 AM   Modules accepted: Orders

## 2023-07-03 ENCOUNTER — Telehealth: Payer: Self-pay

## 2023-07-03 NOTE — Telephone Encounter (Signed)
 [facility] for Gave verbal order for Physical therapy [phone] twice a week for 4 weeks and 1 times a week for 5 weeks

## 2023-07-04 ENCOUNTER — Telehealth: Payer: Self-pay | Admitting: Nurse Practitioner

## 2023-07-04 NOTE — Telephone Encounter (Signed)
 Notified patient of echo appointment date, arrival time, location-[place]

## 2023-07-10 ENCOUNTER — Other Ambulatory Visit: Payer: Self-pay

## 2023-07-10 MED ORDER — PANTOPRAZOLE SODIUM 40 MG PO TBEC: 40.0000 mg | DELAYED_RELEASE_TABLET | Freq: Two times a day (BID) | ORAL | 1 refills | Status: DC

## 2023-07-10 MED ORDER — SPIRONOLACTONE 25 MG PO TABS: 25.0000 mg | ORAL_TABLET | Freq: Every day | ORAL | 3 refills | Status: DC

## 2023-07-10 MED ORDER — FERROUS SULFATE 325 (65 FE) MG PO TABS: 325.0000 mg | ORAL_TABLET | Freq: Every day | ORAL | 3 refills | Status: DC

## 2023-08-01 ENCOUNTER — Ambulatory Visit
Admission: RE | Admit: 2023-08-01 | Discharge: 2023-08-01 | Disposition: A | Discharge status: Home / Self Care | Source: Ambulatory Visit | Attending: Nurse Practitioner | Admitting: Nurse Practitioner

## 2023-08-01 DIAGNOSIS — I5032 Chronic diastolic (congestive) heart failure: Secondary | ICD-10-CM | POA: Diagnosis not present

## 2023-08-01 LAB — ECHOCARDIOGRAM COMPLETE
AR max vel: 2.76 cm2
AV Area VTI: 2.71 cm2
AV Area mean vel: 2.38 cm2
AV Mean grad: 2 mmHg
AV Peak grad: 4 mmHg
Ao pk vel: 1 m/s
Area-P 1/2: 4.83 cm2
MV VTI: 1.96 cm2
S' Lateral: 2.8 cm

## 2023-08-01 NOTE — Progress Notes (Signed)
*  PRELIMINARY RESULTS* Echocardiogram 2D Echocardiogram has been performed.  Broadus Canes 08/01/2023, 11:31 AM

## 2023-08-26 ENCOUNTER — Encounter (INDEPENDENT_AMBULATORY_CARE_PROVIDER_SITE_OTHER): Payer: Self-pay

## 2023-09-02 ENCOUNTER — Other Ambulatory Visit (HOSPITAL_COMMUNITY): Payer: Self-pay

## 2023-09-16 ENCOUNTER — Ambulatory Visit: Payer: Self-pay | Admitting: Nurse Practitioner

## 2023-09-16 NOTE — Progress Notes (Signed)
 Will discuss results at upcoming appt in july

## 2023-09-24 ENCOUNTER — Other Ambulatory Visit: Payer: Self-pay

## 2023-09-24 MED ORDER — SPIRONOLACTONE 25 MG PO TABS: 25.0000 mg | ORAL_TABLET | Freq: Every day | ORAL | 3 refills | Status: AC

## 2023-09-24 MED ORDER — PANTOPRAZOLE SODIUM 40 MG PO TBEC: 40.0000 mg | DELAYED_RELEASE_TABLET | Freq: Two times a day (BID) | ORAL | 1 refills | Status: DC

## 2023-10-09 ENCOUNTER — Ambulatory Visit: Payer: Medicare Other | Admitting: Nurse Practitioner

## 2023-10-20 ENCOUNTER — Ambulatory Visit: Attending: Student | Admitting: Occupational Therapy

## 2023-10-20 ENCOUNTER — Encounter: Payer: Self-pay | Admitting: Occupational Therapy

## 2023-10-20 DIAGNOSIS — M25632 Stiffness of left wrist, not elsewhere classified: Secondary | ICD-10-CM | POA: Insufficient documentation

## 2023-10-20 DIAGNOSIS — M25532 Pain in left wrist: Secondary | ICD-10-CM | POA: Insufficient documentation

## 2023-10-20 DIAGNOSIS — M6281 Muscle weakness (generalized): Secondary | ICD-10-CM | POA: Diagnosis present
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
## 2023-10-23 ENCOUNTER — Encounter: Payer: Self-pay | Admitting: Nurse Practitioner

## 2023-10-23 ENCOUNTER — Ambulatory Visit: Admitting: Nurse Practitioner

## 2023-10-23 VITALS — BP 136/80 | HR 67 | Temp 98.1°F | Resp 16 | Ht 66.0 in | Wt 135.4 lb

## 2023-10-23 DIAGNOSIS — M533 Sacrococcygeal disorders, not elsewhere classified: Secondary | ICD-10-CM | POA: Diagnosis not present

## 2023-10-23 DIAGNOSIS — R251 Tremor, unspecified: Secondary | ICD-10-CM

## 2023-10-23 DIAGNOSIS — S32512G Fracture of superior rim of left pubis, subsequent encounter for fracture with delayed healing: Secondary | ICD-10-CM

## 2023-10-23 DIAGNOSIS — S32592G Other specified fracture of left pubis, subsequent encounter for fracture with delayed healing: Secondary | ICD-10-CM

## 2023-10-23 MED ORDER — OXYCODONE HCL 5 MG PO TABS: 5.0000 mg | ORAL_TABLET | ORAL | 0 refills | Status: AC | PRN

## 2023-10-23 NOTE — Progress Notes (Signed)
 [facility] [address] [place], [place] [address]  Internal MEDICINE  Office Visit Note  Patient Name: Sonya Park  [id number]  [id number]  Date of Service: 10/23/2023  Chief Complaint  Patient presents with   Follow-up    HPI Shamecka presents for a follow-up visit for right hand tremor, sacral back pain and healing pelvic fractures.  Tremor of right hand -- continues to occur daily, often while doing activities and sometimes at rest.  Sacral back pain related to the pelvic fractures -- causes pain, had a prescription for oxycodone  which had lasted for more than a month. She tries not to use it every day.  Healing pelvic fractures, followed by orthopedic and physical therapy.     Current Medication: Outpatient Encounter Medications as of 10/23/2023  Medication Sig Note   celecoxib (CELEBREX) 200 MG capsule Take 200 mg by mouth 2 (two) times daily.    acetaminophen  (TYLENOL ) 325 MG tablet Take 2 tablets (650 mg total) by mouth every 6 (six) hours as needed for mild pain (pain score 1-3) or fever.    aspirin  81 MG tablet Take 81 mg by mouth daily. Once a week (every Sunday)    Biotin (BIOTIN EXTRA STRENGTH) 10 MG CAPS Take by mouth.    calcium  citrate-vitamin D  (CITRACAL+D) 315-200 MG-UNIT tablet Take by mouth.    Copper  Gluconate 2 MG CAPS Take 4 mg by mouth daily.    Cyanocobalamin  (B-12) 3000 MCG CAPS Take by mouth.    ferrous sulfate  (FEROSUL) 325 (65 FE) MG tablet Take 1 tablet (325 mg total) by mouth daily.    Melatonin 5 MG TABS Take 2.5 mg by mouth. As needed 06/16/2023: prn   Multiple Vitamins-Minerals (BARIATRIC MULTIVITAMINS/IRON PO) Take 2 capsules by mouth every morning. After breakfast.    oxyCODONE  (OXY IR/ROXICODONE ) 5 MG immediate release tablet Take 1 tablet (5 mg total) by mouth every 4 (four) hours as needed for severe pain (pain score 7-10).    pantoprazole  (PROTONIX ) 40 MG tablet Take 1 tablet (40 mg total) by mouth 2 (two) times daily before a  meal.    PREVIDENT 5000 SENSITIVE 1.1-5 % GEL Place 1 Application onto teeth as directed. 06/16/2023: prn   PROLIA 60 MG/ML SOSY injection Inject 60 mg into the skin every 6 (six) months. 06/16/2023: Patient states it's due now   spironolactone  (ALDACTONE ) 25 MG tablet Take 1 tablet (25 mg total) by mouth daily.    thiamine  100 MG tablet Take 1 tablet (100 mg total) daily by mouth.    [DISCONTINUED] oxyCODONE  (OXY IR/ROXICODONE ) 5 MG immediate release tablet Take 1 tablet (5 mg total) by mouth every 4 (four) hours as needed for severe pain (pain score 7-10).    No facility-administered encounter medications on file as of 10/23/2023.    Surgical History: Past Surgical History:  Procedure Laterality Date   ABDOMINAL HYSTERECTOMY     BREAST LUMPECTOMY Right    COLONOSCOPY WITH PROPOFOL  N/A 07/18/2015   Procedure: COLONOSCOPY WITH PROPOFOL ;  Surgeon: Rogelia Copping, MD;  Location: [facility] ENDOSCOPY;  Service: Endoscopy;  Laterality: N/A;   COSMETIC SURGERY     GASTRIC BYPASS     X2   HEMORROIDECTOMY     KNEE ARTHROPLASTY Right 12/13/2015   Procedure: COMPUTER ASSISTED TOTAL KNEE ARTHROPLASTY;  Surgeon: Lynwood SHAUNNA Hue, MD;  Location: [facility] ORS;  Service: Orthopedics;  Laterality: Right;   KNEE ARTHROSCOPY     REPLACEMENT TOTAL KNEE Left    SHOULDER SURGERY Right  Medical History: Past Medical History:  Diagnosis Date   Arthritis    Back pain    Insomnia    Medical history non-contributory    Osteoporosis     Family History: Family History  Problem Relation Age of Onset   Liver disease Mother    Heart attack Father    Cancer Father    CAD Sister     Social History   Socioeconomic History   Marital status: Married    Spouse name: Marlana Mckowen   Number of children: 1   Years of education: 12   Highest education level: Some college, no degree  Occupational History   Occupation: Retired  Tobacco Use   Smoking status: Former    Current packs/day: 0.00    Average packs/day:  2.0 packs/day for 29.0 years (58.0 ttl pk-yrs)    Types: Cigarettes    Start date: 11/29/1965    Quit date: 11/30/1994    Years since quitting: 29.0   Smokeless tobacco: Never  Vaping Use   Vaping status: Never Used  Substance and Sexual Activity   Alcohol use: Not Currently    Alcohol/week: 21.0 - 35.0 standard drinks of alcohol    Types: 21 - 35 Glasses of wine per week    Comment: 3-5 glasses of wine/ day   Drug use: No   Sexual activity: Not Currently  Other Topics Concern   Not on file  Social History Narrative   Not on file   Social Drivers of Health   Financial Resource Strain: Low Risk  (10/14/2023)   Received from [facility]   Overall Financial Resource Strain (CARDIA)    Difficulty of Paying Living Expenses: Not hard at all  Food Insecurity: No Food Insecurity (10/14/2023)   Received from [facility]   Hunger Vital Sign    Within the past 12 months, you worried that your food would run out before you got the money to buy more.: Never true    Within the past 12 months, the food you bought just didn't last and you didn't have money to get more.: Never true  Transportation Needs: No Transportation Needs (10/14/2023)   Received from [facility] - Transportation    In the past 12 months, has lack of transportation kept you from medical appointments or from getting medications?: No    Lack of Transportation (Non-Medical): No  Physical Activity: Inactive (03/14/2022)   Exercise Vital Sign    Days of Exercise per Week: 0 days    Minutes of Exercise per Session: 0 min  Stress: Stress Concern Present (03/14/2022)   [employer] of Occupational [employer] - Occupational Stress Questionnaire    Feeling of Stress : To some extent  Social Connections: Moderately Isolated (06/13/2023)   Social Connection and Isolation Panel    Frequency of Communication with Friends and Family: More than three times a week    Frequency of  Social Gatherings with Friends and Family: Once a week    Attends Religious Services: Never    Database administrator or Organizations: No    Attends Banker Meetings: Never    Marital Status: Married  Catering manager Violence: Not At Risk (06/13/2023)   Humiliation, Afraid, Rape, and Kick questionnaire    Fear of Current or Ex-Partner: No    Emotionally Abused: No    Physically Abused: No    Sexually Abused: No      Review of Systems  Constitutional:  Positive for activity change and fatigue. Negative for chills and unexpected weight change.  HENT:  Negative for congestion, postnasal drip, rhinorrhea, sneezing and sore throat.   Eyes:  Negative for redness.  Respiratory: Negative.  Negative for cough, chest tightness and shortness of breath.   Cardiovascular: Negative.  Negative for chest pain and palpitations.  Gastrointestinal: Negative.  Negative for abdominal pain, constipation, diarrhea, nausea and vomiting.  Genitourinary:  Negative for dysuria and frequency.  Musculoskeletal:  Positive for arthralgias, back pain and gait problem. Negative for joint swelling and neck pain.  Skin:  Negative for rash.  Neurological:  Positive for tremors. Negative for numbness.  Hematological:  Negative for adenopathy. Does not bruise/bleed easily.  Psychiatric/Behavioral:  Negative for behavioral problems (Depression), sleep disturbance and suicidal ideas. The patient is not nervous/anxious.     Vital Signs: BP 136/80   Pulse 67   Temp 98.1 F (36.7 C)   Resp 16   Ht 5' 6 (1.676 m)   Wt 135 lb 6.4 oz (61.4 kg)   SpO2 97%   BMI 21.85 kg/m    Physical Exam Vitals reviewed.  Constitutional:      General: She is not in acute distress.    Appearance: Normal appearance. She is normal weight. She is not ill-appearing.  HENT:     Head: Normocephalic and atraumatic.  Eyes:     Pupils: Pupils are equal, round, and reactive to light.  Cardiovascular:     Rate and Rhythm:  Normal rate and regular rhythm.  Pulmonary:     Effort: Pulmonary effort is normal. No respiratory distress.  Neurological:     Mental Status: She is alert and oriented to person, place, and time.  Psychiatric:        Mood and Affect: Mood normal.        Behavior: Behavior normal.        Assessment/Plan: 1. Tremor of right hand (Primary) Referred to neurology - Ambulatory referral to Neurology  2. Sacral back pain Continue celebrex twice daily and prn oxycodone  as prescribed.  - celecoxib (CELEBREX) 200 MG capsule; Take 200 mg by mouth 2 (two) times daily. - oxyCODONE  (OXY IR/ROXICODONE ) 5 MG immediate release tablet; Take 1 tablet (5 mg total) by mouth every 4 (four) hours as needed for severe pain (pain score 7-10).  Dispense: 30 tablet; Refill: 0  3. Closed fracture of superior ramus of left pubis with delayed healing, subsequent encounter Continue celebrex twice daily and prn oxycodone  as prescribed.  - celecoxib (CELEBREX) 200 MG capsule; Take 200 mg by mouth 2 (two) times daily. - oxyCODONE  (OXY IR/ROXICODONE ) 5 MG immediate release tablet; Take 1 tablet (5 mg total) by mouth every 4 (four) hours as needed for severe pain (pain score 7-10).  Dispense: 30 tablet; Refill: 0  4. Closed fracture of left inferior pubic ramus with delayed healing, subsequent encounter Continue celebrex twice daily and prn oxycodone  as prescribed.  - celecoxib (CELEBREX) 200 MG capsule; Take 200 mg by mouth 2 (two) times daily. - oxyCODONE  (OXY IR/ROXICODONE ) 5 MG immediate release tablet; Take 1 tablet (5 mg total) by mouth every 4 (four) hours as needed for severe pain (pain score 7-10).  Dispense: 30 tablet; Refill: 0   General Counseling: Ytzel verbalizes understanding of the findings of todays visit and agrees with plan of treatment. I have discussed any further diagnostic evaluation that may be needed or ordered today. We also reviewed her medications today. she has been encouraged to  call  the office with any questions or concerns that should arise related to todays visit.    Orders Placed This Encounter  Procedures   Ambulatory referral to Neurology    Meds ordered this encounter  Medications   oxyCODONE  (OXY IR/ROXICODONE ) 5 MG immediate release tablet    Sig: Take 1 tablet (5 mg total) by mouth every 4 (four) hours as needed for severe pain (pain score 7-10).    Dispense:  30 tablet    Refill:  0    refill    Return in about 3 months (around 01/23/2024).   Total time spent:30 Minutes Time spent includes review of chart, medications, test results, and follow up plan with the patient.   [place] Controlled Substance Database was reviewed by me.  This patient was seen by Mardy Maxin, FNP-C in collaboration with Dr. Sigrid Bathe as a part of collaborative care agreement.   Chauntelle Azpeitia R. Maxin, MSN, FNP-C Internal medicine

## 2023-10-27 ENCOUNTER — Ambulatory Visit: Admitting: Occupational Therapy

## 2023-10-27 ENCOUNTER — Telehealth: Payer: Self-pay | Admitting: Nurse Practitioner

## 2023-10-27 DIAGNOSIS — M6281 Muscle weakness (generalized): Secondary | ICD-10-CM

## 2023-10-27 DIAGNOSIS — M25632 Stiffness of left wrist, not elsewhere classified: Secondary | ICD-10-CM

## 2023-10-27 DIAGNOSIS — M25532 Pain in left wrist: Secondary | ICD-10-CM

## 2023-10-27 NOTE — Telephone Encounter (Signed)
 Awaiting 10/23/23 office notes for Neurology referral-Toni
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
## 2023-10-30 ENCOUNTER — Ambulatory Visit: Admitting: Occupational Therapy

## 2023-11-03 ENCOUNTER — Ambulatory Visit: Admitting: Occupational Therapy

## 2023-11-06 ENCOUNTER — Ambulatory Visit: Admitting: Occupational Therapy

## 2023-11-10 ENCOUNTER — Encounter: Admitting: Occupational Therapy

## 2023-11-13 ENCOUNTER — Encounter: Admitting: Occupational Therapy

## 2023-11-24 ENCOUNTER — Encounter: Payer: Self-pay | Admitting: Nurse Practitioner

## 2023-11-24 DIAGNOSIS — M533 Sacrococcygeal disorders, not elsewhere classified: Secondary | ICD-10-CM | POA: Insufficient documentation

## 2023-11-24 DIAGNOSIS — R251 Tremor, unspecified: Secondary | ICD-10-CM | POA: Insufficient documentation

## 2023-11-25 ENCOUNTER — Telehealth: Payer: Self-pay | Admitting: Nurse Practitioner

## 2023-11-25 NOTE — Telephone Encounter (Signed)
 Neurology referral sent via [employer] to Dr. Lane w/ [facility]. Notified patient. Gave telephone # [phone]

## 2023-11-27 ENCOUNTER — Telehealth: Payer: Self-pay | Admitting: Nurse Practitioner

## 2023-11-27 NOTE — Telephone Encounter (Signed)
 Neurology appointment 12/03/2023 @ [facility]

## 2023-12-11 ENCOUNTER — Ambulatory Visit: Attending: Student

## 2023-12-11 DIAGNOSIS — R2681 Unsteadiness on feet: Secondary | ICD-10-CM | POA: Diagnosis present

## 2023-12-11 DIAGNOSIS — M6281 Muscle weakness (generalized): Secondary | ICD-10-CM | POA: Diagnosis present

## 2023-12-11 DIAGNOSIS — R296 Repeated falls: Secondary | ICD-10-CM | POA: Diagnosis present

## 2023-12-11 NOTE — Therapy (Signed)
 OUTPATIENT PHYSICAL THERAPY EVALUATION  Patient Name: Sonya Park MRN: [id number] DOB:Mar 13, 1941, 83 y.o., female Today's Date: 12/11/2023  PCP: Liana Fish, NP REFERRING PROVIDER: Liana Fish, NP  END OF SESSION:  PT End of Session - 12/11/23 1109     Visit Number 1    Number of Visits 16    Date for PT Re-Evaluation 02/05/24    Authorization Type [employer]    Authorization Time Period 12/11/23-02/05/24    Progress Note Due on Visit 10    PT Start Time 1100    PT Stop Time 1140    PT Time Calculation (min) 40 min    Equipment Utilized During Treatment Gait belt    Activity Tolerance Patient tolerated treatment well;No increased pain;Patient limited by fatigue    Behavior During Therapy [facility] for tasks assessed/performed          Past Medical History:  Diagnosis Date   Arthritis    Back pain    Insomnia    Medical history non-contributory    Osteoporosis    Past Surgical History:  Procedure Laterality Date   ABDOMINAL HYSTERECTOMY     BREAST LUMPECTOMY Right    COLONOSCOPY WITH PROPOFOL  N/A 07/18/2015   Procedure: COLONOSCOPY WITH PROPOFOL ;  Surgeon: Rogelia Copping, MD;  Location: [facility] ENDOSCOPY;  Service: Endoscopy;  Laterality: N/A;   COSMETIC SURGERY     GASTRIC BYPASS     X2   HEMORROIDECTOMY     KNEE ARTHROPLASTY Right 12/13/2015   Procedure: COMPUTER ASSISTED TOTAL KNEE ARTHROPLASTY;  Surgeon: Lynwood SHAUNNA Hue, MD;  Location: [facility] ORS;  Service: Orthopedics;  Laterality: Right;   KNEE ARTHROSCOPY     REPLACEMENT TOTAL KNEE Left    SHOULDER SURGERY Right    Patient Active Problem List   Diagnosis Date Noted   Tremor of right hand 11/24/2023   Sacral back pain 11/24/2023   Closed fracture of left inferior pubic ramus (HCC) 06/25/2023   Closed fracture of left distal radius 06/14/2023   Closed pelvic fracture (HCC) 06/13/2023   HTN (hypertension) 06/13/2023   Chronic diastolic CHF (congestive heart failure) (HCC) 06/13/2023   Wrist fracture,  closed, left, initial encounter 06/13/2023   Vitamin D  deficiency 04/12/2023   Hypocalcemia 04/12/2023   Iron deficiency anemia 04/16/2022   Closed displaced intertrochanteric fracture of right femur (HCC) 03/05/2022   Closed fracture of proximal end of right humerus 03/05/2022   Fracture of right hip (HCC) 02/03/2022   Shoulder fracture, right, closed, initial encounter 02/03/2022   Fall at home, initial encounter 02/03/2022   Neck pain 10/24/2021   Irritable bowel syndrome 10/14/2021   Obstructive sleep apnea of adult 10/14/2021   Vulvovaginitis 07/27/2021   Hypoglycemia 04/11/2021   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 01/16/2021   Vertigo 06/09/2020   Rotator cuff arthropathy of right shoulder 03/22/2020   Hyperlipidemia 02/21/2020   Need for COVID-19 vaccine 02/14/2020   Essential hypertension 09/02/2019   Seasonal allergic rhinitis due to pollen 09/02/2019   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) 11/17/2017   Lymphedema 08/11/2017   Anemia, macrocytic, nutritional    Macrocytic anemia    Dysphagia    Itching    Screening for colon cancer    LFT elevation    Severe malnutrition (HCC)    Adult failure to thrive    Hyperbilirubinemia    Alcoholic liver failure (HCC)    Thrombocytopenia (HCC)  Pressure injury of skin 01/19/2017   Bacteremia    Palliative care by specialist    Goals of care, counseling/discussion    Alcohol abuse 01/15/2017   Facial trauma 01/14/2017   Osteoporosis 01/14/2017   Neutropenia (HCC) 01/14/2017   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   Special screening for malignant neoplasms, colon    Benign neoplasm of transverse colon    Fracture of right inferior pubic ramus (HCC) 09/17/2014   Compression fracture of L3 vertebra (HCC) 09/17/2014   Closed wedge compression fracture of third lumbar vertebra (HCC) 09/17/2014   Other  specified fracture of right pubis, initial encounter for closed fracture (HCC) 09/17/2014   ONSET DATE: chronic REFERRING DIAG: falls THERAPY DIAG:  Repeated falls  Unsteadiness on feet  Rationale for Evaluation and Treatment: Rehabilitation  SUBJECTIVE:                                                                                                                                                                                             SUBJECTIVE STATEMENT: Pt has fallen a lot in the past; neurologist wants her to see PT for balance.   PERTINENT HISTORY:   83yoF who is referred to OPPT for imbalance and multiple falls since 2018. Most recently sustained a pelvis fracture in 2025 s/p fall and Rt hip fracture s/p fall 2024. 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 Pt is currently back to her baseline which includes ad lib AMB without device, however she has not tolerated community distances in years due to chronic low back pain, is more recently limited by SOB and chest tightness with walking longer distances. Pt is fairly active at home, plans to leave here after evaluation visit and help her husband finish putting out more mulch at the house.   PAIN:  Are you having pain? No; no regular pain, does have some onset soreness in Rt trochanteric area with prolonged walking >2 minutes.   PRECAUTIONS: falls  WEIGHT BEARING RESTRICTIONS: None  FALLS: Has patient fallen in last 6 months? Yes   LIVING ENVIRONMENT: Lives with: husband  Lives in: 2 level house, lives on main level  Stairs: 2 separate steps Has following equipment at home: WC, 5 canes, walker  PLOF: has recently rehabbed back to walking without a cane  PATIENT GOALS: stop falling   OBJECTIVE:  Note: Objective measures were completed at Evaluation unless otherwise noted.  DIAGNOSTIC FINDINGS:   SENSATION: Says she has a lot of neuropathy and numbness in feet and lower legs   TRANSFERS: 5xSTS hands free: 13.78sec  CURB:   Dependent on UE support, prefers a car  GAIT: -  632ft AMB; no device, no LOB; 4 minutes, 31sec; progressive Right hip pain up to 4-5/10;  *has some chest tightness, SOB; toward end   CERVICAL ROTATION: Rt cervical rotation: 31 degrees (pain free)  Left reotation 50 degrees    [facility] PT Assessment - 12/11/23 0001       Balance   Balance Assessed Yes      Standardized Balance Assessment   Standardized Balance Assessment Berg Balance Test      Berg Balance Test   Sit to Stand Able to stand without using hands and stabilize independently    Standing Unsupported Able to stand safely 2 minutes    Sitting with Back Unsupported but Feet Supported on Floor or Stool Able to sit safely and securely 2 minutes    Stand to Sit Sits safely with minimal use of hands    Transfers Able to transfer safely, minor use of hands    Standing Unsupported with Eyes Closed Able to stand 10 seconds safely    Standing Unsupported with Feet Together Able to place feet together independently and stand for 1 minute with supervision    From Standing, Reach Forward with Outstretched Arm Can reach confidently >25 cm (10)    From Standing Position, Pick up Object from Floor Able to pick up shoe, needs supervision    From Standing Position, Turn to Look Behind Over each Shoulder Turn sideways only but maintains balance   restricted cervical ROM   Turn 360 Degrees Able to turn 360 degrees safely in 4 seconds or less    Standing Unsupported, Alternately Place Feet on Step/Stool Able to complete >2 steps/needs minimal assist    Standing Unsupported, One Foot in Front Able to plae foot ahead of the other independently and hold 30 seconds    Standing on One Leg Tries to lift leg/unable to hold 3 seconds but remains standing independently    Total Score 45                                                                                                                                    TREATMENT DATE 12/11/23 : -622ft  AMB overground  -STS from chair x5, hands free, supervision level assist without LOB  -Single leg stance balance practice 5 attempts bilat -tandem balance practice x3 bilat -wide tandem balance 1x30sec bilat -standing step taps x16, alternating sides; pt has 1 LOB/mistep requires assist and support rail -alternating 360 degree turns x2, minGuard assist  -standing with attempted twist and glance posterior target: 6 tries, unable to see beyond 45 degrees posterior, mostly peripheral vision; -ROM of cervical rotation   PATIENT EDUCATION: Education details: explained vallet parking and volunteer services offering; explained how Rt hip instability is causing Left toe catching issues;  Person educated: Patient Education method: discussion  Education comprehension: Fair   HOME EXERCISE PROGRAM: Will defer to visits 2 and 3   GOALS: Goals reviewed with  patient? Yes   SHORT TERM GOALS: Target date: 01/10/24  Pt to demonstrate improved cervical rotation ROM right side >40 degrees to improve safety with scanning driving and reduce need for full body rotation during household activity.  Baseline: eval: 30 degrees Rt rotation.  Goal status: INITIAL  2.  Pt to demonstrate 5xSTS in <11.5sec hands free and without LOB to demonstrate improve BLE power.  Baseline:  Goal status: INITIAL  3.  Pt will demonstrate ability to hold tandem stance balance > 20sec bilat to demonstrate improved hip strength/proprioception for balance.  Baseline:  Goal status: INITIAL  LONG TERM GOALS: Target date: 02/10/24  Pt to improve Berg Balance Test score to >52 to indicate reduced risk of falls.  Baseline: 45 Goal status: INITIAL  2.  Pt to demonstrate 5x STS <10.4sec hands free and without LOB to show improved power in BLE.  Baseline:  Goal status: INITIAL  3.  Pt to demonstrate ability to perform >60sec tandem stance bilat.  Baseline: <5secH  Goal status: INITIAL  4.  Pt to demonstrate improved Right  cervical rotation ROM >46 degrees to improve safety with visual scanning in car and while working in yard.  Baseline: 30 degrees Goal status: INITIAL  ASSESSMENT:  CLINICAL IMPRESSION: 83yoF referred to OPPT for balance training after several years history of falls and trauma. Exam revealing of significant intolerance to AMB distances required for community walking, limited by back pain, hip pain, and SOB/DOE. BBT indicates elevated risk of falls which correlates well wth history. Pt reports trouble with foot clearance in stepping as a contributing factor, in examination this appears to be driven largely from her Rt sided [place]. Patient will benefit from skilled physical therapy intervention to reduce deficits and impairments identified in evaluation, in order to reduce pain, improve quality of life, and maximize activity tolerance for ADL, IADL, and leisure/fitness. Physical therapy will help pt achieve long and short term goals of care.   OBJECTIVE IMPAIRMENTS: Decreased knowledge of condition, decreased use of DME, decreased mobility, difficulty walking, decreased strength, decreased ROM. ACTIVITY LIMITATIONS: Lifting, standing, walking, squatting, transfers, locomotion level PARTICIPATION LIMITATIONS: Cleaning, laundry, interpersonal relationships, driving, yardwork, community activity.  PERSONAL FACTORS: Age, behavior pattern, education, past/current experiences, transportation, profession  are also affecting patient's functional outcome.  REHAB POTENTIAL: Good CLINICAL DECISION MAKING: Medium  EVALUATION COMPLEXITY: Moderate   PLAN:  PT FREQUENCY: 1-2x/week  PT DURATION: 8 weeks  PLANNED INTERVENTIONS: 97110-Therapeutic exercises, 97530- Therapeutic activity, [id number]- Neuromuscular re-education, 97535- Self Care, [id number]- Manual therapy, [phone]- Gait training, [phone]- Electrical stimulation (manual), Patient/Family education, Balance training, Stair training, Joint mobilization, Joint  manipulation, DME instructions, Cryotherapy, and Moist heat  PLAN FOR NEXT SESSION: trial P/ROM cervical rotation ROM (clear any obvious soft tissue restrictions; establish HEP to improve SLS balance, tandem balance and Rt hip stabilization function.    Ladarious Kresse C, PT 12/11/2023, 11:11 AM  12:51 PM, 12/11/23 Peggye JAYSON Linear, PT, DPT Physical Therapist - [facility]  Outpatient Physical Therapy- [facility] [phone]

## 2023-12-16 ENCOUNTER — Telehealth: Payer: Self-pay | Admitting: Physical Therapy

## 2023-12-16 ENCOUNTER — Ambulatory Visit: Admitting: Physical Therapy

## 2023-12-16 NOTE — Telephone Encounter (Signed)
 Contacted pt regarding missed appointment this date. Pt also informed of next visit on 12/22/23 at 11:45 AM. Pt requests another schedule upon next treatment session.   Note: Portions of this document were prepared using Dragon voice recognition software and although reviewed may contain unintentional dictation errors in syntax, grammar, or spelling.  Lonni KATHEE Gainer  ,DPT Physical Therapist- [place]  [facility]

## 2023-12-16 NOTE — Therapy (Deleted)
 OUTPATIENT PHYSICAL THERAPY EVALUATION  Patient Name: Sonya Park MRN: [id number] DOB:10-04-1940, 83 y.o., female Today's Date: 12/16/2023  PCP: Liana Fish, NP REFERRING PROVIDER: Maree Jannett POUR, MD  END OF SESSION:    Past Medical History:  Diagnosis Date   Arthritis    Back pain    Insomnia    Medical history non-contributory    Osteoporosis    Past Surgical History:  Procedure Laterality Date   ABDOMINAL HYSTERECTOMY     BREAST LUMPECTOMY Right    COLONOSCOPY WITH PROPOFOL  N/A 07/18/2015   Procedure: COLONOSCOPY WITH PROPOFOL ;  Surgeon: Rogelia Copping, MD;  Location: [facility] ENDOSCOPY;  Service: Endoscopy;  Laterality: N/A;   COSMETIC SURGERY     GASTRIC BYPASS     X2   HEMORROIDECTOMY     KNEE ARTHROPLASTY Right 12/13/2015   Procedure: COMPUTER ASSISTED TOTAL KNEE ARTHROPLASTY;  Surgeon: Lynwood SHAUNNA Hue, MD;  Location: [facility] ORS;  Service: Orthopedics;  Laterality: Right;   KNEE ARTHROSCOPY     REPLACEMENT TOTAL KNEE Left    SHOULDER SURGERY Right    Patient Active Problem List   Diagnosis Date Noted   Tremor of right hand 11/24/2023   Sacral back pain 11/24/2023   Closed fracture of left inferior pubic ramus (HCC) 06/25/2023   Closed fracture of left distal radius 06/14/2023   Closed pelvic fracture (HCC) 06/13/2023   HTN (hypertension) 06/13/2023   Chronic diastolic CHF (congestive heart failure) (HCC) 06/13/2023   Wrist fracture, closed, left, initial encounter 06/13/2023   Vitamin D  deficiency 04/12/2023   Hypocalcemia 04/12/2023   Iron deficiency anemia 04/16/2022   Closed displaced intertrochanteric fracture of right femur (HCC) 03/05/2022   Closed fracture of proximal end of right humerus 03/05/2022   Fracture of right hip (HCC) 02/03/2022   Shoulder fracture, right, closed, initial encounter 02/03/2022   Fall at home, initial encounter 02/03/2022   Neck pain 10/24/2021   Irritable bowel syndrome 10/14/2021   Obstructive sleep apnea of adult  10/14/2021   Vulvovaginitis 07/27/2021   Hypoglycemia 04/11/2021   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 01/16/2021   Vertigo 06/09/2020   Rotator cuff arthropathy of right shoulder 03/22/2020   Hyperlipidemia 02/21/2020   Need for COVID-19 vaccine 02/14/2020   Essential hypertension 09/02/2019   Seasonal allergic rhinitis due to pollen 09/02/2019   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) 11/17/2017   Lymphedema 08/11/2017   Anemia, macrocytic, nutritional    Macrocytic anemia    Dysphagia    Itching    Screening for colon cancer    LFT elevation    Severe malnutrition (HCC)    Adult failure to thrive    Hyperbilirubinemia    Alcoholic liver failure (HCC)    Thrombocytopenia (HCC)    Pressure injury of skin 01/19/2017   Bacteremia    Palliative care by specialist    Goals of care, counseling/discussion    Alcohol abuse 01/15/2017   Facial trauma 01/14/2017   Osteoporosis 01/14/2017   Neutropenia (HCC) 01/14/2017   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   Special screening for malignant neoplasms, colon    Benign neoplasm of transverse colon    Fracture of right inferior pubic ramus (HCC) 09/17/2014   Compression fracture of L3 vertebra (HCC) 09/17/2014   Closed wedge compression fracture of third lumbar vertebra (HCC) 09/17/2014   Other specified fracture of right pubis, initial encounter for closed fracture (HCC) 09/17/2014  ONSET DATE: chronic REFERRING DIAG: falls THERAPY DIAG:  Repeated falls  Unsteadiness on feet  Rationale for Evaluation and Treatment: Rehabilitation  SUBJECTIVE:                                                                                                                                                                                             SUBJECTIVE STATEMENT: Pt has fallen a lot in the past; neurologist wants her to see  PT for balance.   PERTINENT HISTORY:   83yoF who is referred to OPPT for imbalance and multiple falls since 2018. Most recently sustained a pelvis fracture in 2025 s/p fall and Rt hip fracture s/p fall 2024. 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 Pt is currently back to her baseline which includes ad lib AMB without device, however she has not tolerated community distances in years due to chronic low back pain, is more recently limited by SOB and chest tightness with walking longer distances. Pt is fairly active at home, plans to leave here after evaluation visit and help her husband finish putting out more mulch at the house.   PAIN:  Are you having pain? No; no regular pain, does have some onset soreness in Rt trochanteric area with prolonged walking >2 minutes.   PRECAUTIONS: falls  WEIGHT BEARING RESTRICTIONS: None  FALLS: Has patient fallen in last 6 months? Yes   LIVING ENVIRONMENT: Lives with: husband  Lives in: 2 level house, lives on main level  Stairs: 2 separate steps Has following equipment at home: WC, 5 canes, walker  PLOF: has recently rehabbed back to walking without a cane  PATIENT GOALS: stop falling   OBJECTIVE:  Note: Objective measures were completed at Evaluation unless otherwise noted.  DIAGNOSTIC FINDINGS:   SENSATION: Says she has a lot of neuropathy and numbness in feet and lower legs   TRANSFERS: 5xSTS hands free: 13.78sec  CURB:  Dependent on UE support, prefers a car  GAIT: -635ft AMB; no device, no LOB; 4 minutes, 31sec; progressive Right hip pain up to 4-5/10;  *has some chest tightness, SOB; toward end   CERVICAL ROTATION: Rt cervical rotation: 31 degrees (pain free)  Left reotation 50 degrees  TREATMENT DATE 12/16/23 : -670ft AMB overground  -STS from chair x5, hands free, supervision level assist without LOB   -Single leg stance balance practice 5 attempts bilat -tandem balance practice x3 bilat -wide tandem balance 1x30sec bilat -standing step taps x16, alternating sides; pt has 1 LOB/mistep requires assist and support rail -alternating 360 degree turns x2, minGuard assist  -standing with attempted twist and glance posterior target: 6 tries, unable to see beyond 45 degrees posterior, mostly peripheral vision; -ROM of cervical rotation   PATIENT EDUCATION: Education details: explained vallet parking and volunteer services offering; explained how Rt hip instability is causing Left toe catching issues;  Person educated: Patient Education method: discussion  Education comprehension: Fair   HOME EXERCISE PROGRAM: Will defer to visits 2 and 3   GOALS: Goals reviewed with patient? Yes   SHORT TERM GOALS: Target date: 01/10/24  Pt to demonstrate improved cervical rotation ROM right side >40 degrees to improve safety with scanning driving and reduce need for full body rotation during household activity.  Baseline: eval: 30 degrees Rt rotation.  Goal status: INITIAL  2.  Pt to demonstrate 5xSTS in <11.5sec hands free and without LOB to demonstrate improve BLE power.  Baseline:  Goal status: INITIAL  3.  Pt will demonstrate ability to hold tandem stance balance > 20sec bilat to demonstrate improved hip strength/proprioception for balance.  Baseline:  Goal status: INITIAL  LONG TERM GOALS: Target date: 02/10/24  Pt to improve Berg Balance Test score to >52 to indicate reduced risk of falls.  Baseline: 45 Goal status: INITIAL  2.  Pt to demonstrate 5x STS <10.4sec hands free and without LOB to show improved power in BLE.  Baseline:  Goal status: INITIAL  3.  Pt to demonstrate ability to perform >60sec tandem stance bilat.  Baseline: <5secH  Goal status: INITIAL  4.  Pt to demonstrate improved Right cervical rotation ROM >46 degrees to improve safety with visual scanning in car and while  working in yard.  Baseline: 30 degrees Goal status: INITIAL  ASSESSMENT:  CLINICAL IMPRESSION: 83yoF referred to OPPT for balance training after several years history of falls and trauma. Exam revealing of significant intolerance to AMB distances required for community walking, limited by back pain, hip pain, and SOB/DOE. BBT indicates elevated risk of falls which correlates well wth history. Pt reports trouble with foot clearance in stepping as a contributing factor, in examination this appears to be driven largely from her Rt sided [place]. Patient will benefit from skilled physical therapy intervention to reduce deficits and impairments identified in evaluation, in order to reduce pain, improve quality of life, and maximize activity tolerance for ADL, IADL, and leisure/fitness. Physical therapy will help pt achieve long and short term goals of care.   OBJECTIVE IMPAIRMENTS: Decreased knowledge of condition, decreased use of DME, decreased mobility, difficulty walking, decreased strength, decreased ROM. ACTIVITY LIMITATIONS: Lifting, standing, walking, squatting, transfers, locomotion level PARTICIPATION LIMITATIONS: Cleaning, laundry, interpersonal relationships, driving, yardwork, community activity.  PERSONAL FACTORS: Age, behavior pattern, education, past/current experiences, transportation, profession  are also affecting patient's functional outcome.  REHAB POTENTIAL: Good CLINICAL DECISION MAKING: Medium  EVALUATION COMPLEXITY: Moderate   PLAN:  PT FREQUENCY: 1-2x/week  PT DURATION: 8 weeks  PLANNED INTERVENTIONS: 97110-Therapeutic exercises, 97530- Therapeutic activity, [id number]- Neuromuscular re-education, 97535- Self Care, 02859- Manual therapy, [phone]- Gait training, [phone]- Electrical stimulation (manual), Patient/Family education, Balance training, Stair training, Joint mobilization, Joint manipulation, DME instructions, Cryotherapy, and Moist heat  PLAN FOR NEXT SESSION:  trial P/ROM cervical rotation ROM (clear any obvious soft tissue restrictions; establish HEP to improve SLS balance, tandem balance and Rt hip stabilization function.    Lonni KATHEE Gainer, PT 12/16/2023, 9:32 AM  9:32 AM, 12/16/23

## 2023-12-22 ENCOUNTER — Ambulatory Visit

## 2023-12-22 DIAGNOSIS — R2681 Unsteadiness on feet: Secondary | ICD-10-CM

## 2023-12-22 DIAGNOSIS — R296 Repeated falls: Secondary | ICD-10-CM | POA: Diagnosis not present

## 2023-12-22 NOTE — Therapy (Signed)
 OUTPATIENT PHYSICAL THERAPY TREATMENT  Patient Name: Sonya Park MRN: [id number] DOB:[birth year], 83 y.o., female Today's Date: 12/22/2023  PCP: Liana Fish, NP REFERRING PROVIDER: Maree Jannett POUR, MD  END OF SESSION:  PT End of Session - 12/22/23 1151     Visit Number 2    Number of Visits 16    Date for PT Re-Evaluation 02/05/24    Authorization Type [employer]    Authorization Time Period 12/11/23-02/05/24    Progress Note Due on Visit 10    PT Start Time 1145    PT Stop Time 1225    PT Time Calculation (min) 40 min    Equipment Utilized During Treatment Gait belt    Activity Tolerance Patient tolerated treatment well;No increased pain;Patient limited by fatigue    Behavior During Therapy [facility] for tasks assessed/performed          Past Medical History:  Diagnosis Date   Arthritis    Back pain    Insomnia    Medical history non-contributory    Osteoporosis    Past Surgical History:  Procedure Laterality Date   ABDOMINAL HYSTERECTOMY     BREAST LUMPECTOMY Right    COLONOSCOPY WITH PROPOFOL  N/A 07/18/2015   Procedure: COLONOSCOPY WITH PROPOFOL ;  Surgeon: Rogelia Copping, MD;  Location: [facility] ENDOSCOPY;  Service: Endoscopy;  Laterality: N/A;   COSMETIC SURGERY     GASTRIC BYPASS     X2   HEMORROIDECTOMY     KNEE ARTHROPLASTY Right 12/13/2015   Procedure: COMPUTER ASSISTED TOTAL KNEE ARTHROPLASTY;  Surgeon: Lynwood SHAUNNA Hue, MD;  Location: [facility] ORS;  Service: Orthopedics;  Laterality: Right;   KNEE ARTHROSCOPY     REPLACEMENT TOTAL KNEE Left    SHOULDER SURGERY Right    Patient Active Problem List   Diagnosis Date Noted   Tremor of right hand 11/24/2023   Sacral back pain 11/24/2023   Closed fracture of left inferior pubic ramus (HCC) 06/25/2023   Closed fracture of left distal radius 06/14/2023   Closed pelvic fracture (HCC) 06/13/2023   HTN (hypertension) 06/13/2023   Chronic diastolic CHF (congestive heart failure) (HCC) 06/13/2023   Wrist fracture,  closed, left, initial encounter 06/13/2023   Vitamin D  deficiency 04/12/2023   Hypocalcemia 04/12/2023   Iron deficiency anemia 04/16/2022   Closed displaced intertrochanteric fracture of right femur (HCC) 03/05/2022   Closed fracture of proximal end of right humerus 03/05/2022   Fracture of right hip (HCC) 02/03/2022   Shoulder fracture, right, closed, initial encounter 02/03/2022   Fall at home, initial encounter 02/03/2022   Neck pain 10/24/2021   Irritable bowel syndrome 10/14/2021   Obstructive sleep apnea of adult 10/14/2021   Vulvovaginitis 07/27/2021   Hypoglycemia 04/11/2021   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 01/16/2021   Vertigo 06/09/2020   Rotator cuff arthropathy of right shoulder 03/22/2020   Hyperlipidemia 02/21/2020   Need for COVID-19 vaccine 02/14/2020   Essential hypertension 09/02/2019   Seasonal allergic rhinitis due to pollen 09/02/2019   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) 11/17/2017   Lymphedema 08/11/2017   Anemia, macrocytic, nutritional    Macrocytic anemia    Dysphagia    Itching    Screening for colon cancer    LFT elevation    Severe malnutrition (HCC)    Adult failure to thrive    Hyperbilirubinemia    Alcoholic liver failure (HCC)    Thrombocytopenia (HCC)  Pressure injury of skin 01/19/2017   Bacteremia    Palliative care by specialist    Goals of care, counseling/discussion    Alcohol abuse 01/15/2017   Facial trauma 01/14/2017   Osteoporosis 01/14/2017   Neutropenia (HCC) 01/14/2017   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   Special screening for malignant neoplasms, colon    Benign neoplasm of transverse colon    Fracture of right inferior pubic ramus (HCC) 09/17/2014   Compression fracture of L3 vertebra (HCC) 09/17/2014   Closed wedge compression fracture of third lumbar vertebra (HCC) 09/17/2014   Other  specified fracture of right pubis, initial encounter for closed fracture (HCC) 09/17/2014   ONSET DATE: chronic REFERRING DIAG: falls THERAPY DIAG:  Repeated falls  Unsteadiness on feet  Rationale for Evaluation and Treatment: Rehabilitation  SUBJECTIVE:                                                                                                                                                                                             SUBJECTIVE STATEMENT: Pt doing ok today; pain in the right hip when on it too much.  PERTINENT HISTORY:   83yoF who is referred to OPPT for imbalance and multiple falls since 2018. Most recently sustained a pelvis fracture in 2025 s/p fall and Rt hip fracture s/p fall 2024. 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 Pt is currently back to her baseline which includes ad lib AMB without device, however she has not tolerated community distances in years due to chronic low back pain, is more recently limited by SOB and chest tightness with walking longer distances. Pt is fairly active at home, plans to leave here after evaluation visit and help her husband finish putting out more mulch at the house.   PAIN:  Are you having pain? Right, but no pain at rest.    PRECAUTIONS: falls  WEIGHT BEARING RESTRICTIONS: None  FALLS: Has patient fallen in last 6 months? Yes   LIVING ENVIRONMENT: Lives with: husband  Lives in: 2 level house, lives on main level  Stairs: 2 separate steps Has following equipment at home: WC, 5 canes, walker  PLOF: has recently rehabbed back to walking without a cane  PATIENT GOALS: stop falling   OBJECTIVE:  TREATMENT DATE 12/22/23 : -AA/ROM on Nustep, level 2, seat 8, arms 8 for joint lubrication;   -STS from chair hands free x10 -double heel raise x20 -double ankle dorsum raise x15  -STS from chair hands free  x10 -double heel raise x20 -double ankle dorsum raise x15  -seated downward row 1x15 at 12.5lb (cable machine)  -overground AMB with 3lb AW bilat (2:26)  -seated downward row 1x15 at 12.5lb (cable machine)  -overground backward AMB c 3lb AW bilat   -lateral stepping in // bars, redTB at lower thighs 1x40ft bilat  (2 hand support)   PATIENT EDUCATION: Education details: repeat explanation of vallet parking and volunteer services offering; explained how Rt hip instability is causing Left toe catching issues;  Person educated: Patient Education method: discussion  Education comprehension: Fair (some memory retention difficulty)   HOME EXERCISE PROGRAM: Will defer to visits 2 and 3   GOALS: Goals reviewed with patient? Yes   SHORT TERM GOALS: Target date: 01/10/24  Pt to demonstrate improved cervical rotation ROM right side >40 degrees to improve safety with scanning driving and reduce need for full body rotation during household activity.  Baseline: eval: 30 degrees Rt rotation.  Goal status: INITIAL  2.  Pt to demonstrate 5xSTS in <11.5sec hands free and without LOB to demonstrate improve BLE power.  Baseline:  Goal status: INITIAL  3.  Pt will demonstrate ability to hold tandem stance balance > 20sec bilat to demonstrate improved hip strength/proprioception for balance.  Baseline:  Goal status: INITIAL  LONG TERM GOALS: Target date: 02/10/24  Pt to improve Berg Balance Test score to >52 to indicate reduced risk of falls.  Baseline: 45 Goal status: INITIAL  2.  Pt to demonstrate 5x STS <10.4sec hands free and without LOB to show improved power in BLE.  Baseline:  Goal status: INITIAL  3.  Pt to demonstrate ability to perform >60sec tandem stance bilat.  Baseline: <5secH  Goal status: INITIAL  4.  Pt to demonstrate improved Right cervical rotation ROM >46 degrees to improve safety with visual scanning in car and while working in yard.  Baseline: 30 degrees Goal  status: INITIAL  ASSESSMENT:  CLINICAL IMPRESSION: Initiated general strengthening this date, did not begin any cervical stretches as of yet. Pt able to complete most interventions without much aggravation of Rt hip. Education again on how Rt hip weakness is leading to left toe catching incidents. Pt has some intermittent dizziness in session while standing, standing BP unremarkable, other than a little soft. Patient will benefit from skilled physical therapy intervention to reduce deficits and impairments identified in evaluation, in order to reduce pain, improve quality of life, and maximize activity tolerance for ADL, IADL, and leisure/fitness. Physical therapy will help pt achieve long and short term goals of care.   OBJECTIVE IMPAIRMENTS: Decreased knowledge of condition, decreased use of DME, decreased mobility, difficulty walking, decreased strength, decreased ROM. ACTIVITY LIMITATIONS: Lifting, standing, walking, squatting, transfers, locomotion level PARTICIPATION LIMITATIONS: Cleaning, laundry, interpersonal relationships, driving, yardwork, community activity.  PERSONAL FACTORS: Age, behavior pattern, education, past/current experiences, transportation, profession  are also affecting patient's functional outcome.  REHAB POTENTIAL: Good CLINICAL DECISION MAKING: Medium  EVALUATION COMPLEXITY: Moderate   PLAN:  PT FREQUENCY: 1-2x/week  PT DURATION: 8 weeks  PLANNED INTERVENTIONS: 97110-Therapeutic exercises, 97530- Therapeutic activity, 97112- Neuromuscular re-education, 97535- Self Care, [id number]- Manual therapy, [phone]- Gait training, [phone]- Electrical stimulation (manual), Patient/Family education, Balance training, Stair training, Joint mobilization, Joint manipulation, DME instructions, Cryotherapy, and Moist  heat  PLAN FOR NEXT SESSION: trial P/ROM cervical rotation ROM (clear any obvious soft tissue restrictions; establish HEP to improve SLS balance, tandem balance and Rt hip  stabilization function.    Birt Reinoso C, PT 12/22/2023, 11:55 AM  11:55 AM, 12/22/23 Peggye JAYSON Linear, PT, DPT Physical Therapist - [facility]  Outpatient Physical Therapy- Main Campus [phone]

## 2023-12-24 ENCOUNTER — Ambulatory Visit

## 2023-12-24 DIAGNOSIS — R296 Repeated falls: Secondary | ICD-10-CM

## 2023-12-24 DIAGNOSIS — M6281 Muscle weakness (generalized): Secondary | ICD-10-CM

## 2023-12-24 DIAGNOSIS — R2681 Unsteadiness on feet: Secondary | ICD-10-CM

## 2023-12-24 NOTE — Therapy (Signed)
 OUTPATIENT PHYSICAL THERAPY TREATMENT  Patient Name: Sonya Park MRN: [id number] DOB:[birth year], 83 y.o., female Today's Date: 12/24/2023  PCP: Liana Fish, NP REFERRING PROVIDER: Maree Jannett POUR, MD  END OF SESSION:  PT End of Session - 12/24/23 1152     Visit Number 3    Number of Visits 16    Date for PT Re-Evaluation 02/05/24    Authorization Type [employer] Medicare    Authorization Time Period 12/11/23-02/05/24    Progress Note Due on Visit 10    PT Start Time 1149    PT Stop Time 1227    PT Time Calculation (min) 38 min    Equipment Utilized During Treatment Gait belt    Activity Tolerance Patient tolerated treatment well;No increased pain;Patient limited by fatigue    Behavior During Therapy [facility] for tasks assessed/performed          Past Medical History:  Diagnosis Date   Arthritis    Back pain    Insomnia    Medical history non-contributory    Osteoporosis    Past Surgical History:  Procedure Laterality Date   ABDOMINAL HYSTERECTOMY     BREAST LUMPECTOMY Right    COLONOSCOPY WITH PROPOFOL  N/A 07/18/2015   Procedure: COLONOSCOPY WITH PROPOFOL ;  Surgeon: Rogelia Copping, MD;  Location: [facility] ENDOSCOPY;  Service: Endoscopy;  Laterality: N/A;   COSMETIC SURGERY     GASTRIC BYPASS     X2   HEMORROIDECTOMY     KNEE ARTHROPLASTY Right 12/13/2015   Procedure: COMPUTER ASSISTED TOTAL KNEE ARTHROPLASTY;  Surgeon: Lynwood SHAUNNA Hue, MD;  Location: [facility] ORS;  Service: Orthopedics;  Laterality: Right;   KNEE ARTHROSCOPY     REPLACEMENT TOTAL KNEE Left    SHOULDER SURGERY Right    Patient Active Problem List   Diagnosis Date Noted   Tremor of right hand 11/24/2023   Sacral back pain 11/24/2023   Closed fracture of left inferior pubic ramus (HCC) 06/25/2023   Closed fracture of left distal radius 06/14/2023   Closed pelvic fracture (HCC) 06/13/2023   HTN (hypertension) 06/13/2023   Chronic diastolic CHF (congestive heart failure) (HCC) 06/13/2023   Wrist fracture,  closed, left, initial encounter 06/13/2023   Vitamin D  deficiency 04/12/2023   Hypocalcemia 04/12/2023   Iron deficiency anemia 04/16/2022   Closed displaced intertrochanteric fracture of right femur (HCC) 03/05/2022   Closed fracture of proximal end of right humerus 03/05/2022   Fracture of right hip (HCC) 02/03/2022   Shoulder fracture, right, closed, initial encounter 02/03/2022   Fall at home, initial encounter 02/03/2022   Neck pain 10/24/2021   Irritable bowel syndrome 10/14/2021   Obstructive sleep apnea of adult 10/14/2021   Vulvovaginitis 07/27/2021   Hypoglycemia 04/11/2021   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 01/16/2021   Vertigo 06/09/2020   Rotator cuff arthropathy of right shoulder 03/22/2020   Hyperlipidemia 02/21/2020   Need for COVID-19 vaccine 02/14/2020   Essential hypertension 09/02/2019   Seasonal allergic rhinitis due to pollen 09/02/2019   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) 11/17/2017   Lymphedema 08/11/2017   Anemia, macrocytic, nutritional    Macrocytic anemia    Dysphagia    Itching    Screening for colon cancer    LFT elevation    Severe malnutrition (HCC)    Adult failure to thrive    Hyperbilirubinemia    Alcoholic liver failure (HCC)    Thrombocytopenia (HCC)  Pressure injury of skin 01/19/2017   Bacteremia    Palliative care by specialist    Goals of care, counseling/discussion    Alcohol abuse 01/15/2017   Facial trauma 01/14/2017   Osteoporosis 01/14/2017   Neutropenia (HCC) 01/14/2017   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   Special screening for malignant neoplasms, colon    Benign neoplasm of transverse colon    Fracture of right inferior pubic ramus (HCC) 09/17/2014   Compression fracture of L3 vertebra (HCC) 09/17/2014   Closed wedge compression fracture of third lumbar vertebra (HCC) 09/17/2014   Other  specified fracture of right pubis, initial encounter for closed fracture (HCC) 09/17/2014   ONSET DATE: chronic REFERRING DIAG: falls THERAPY DIAG:  Repeated falls  Unsteadiness on feet  Muscle weakness (generalized)  Rationale for Evaluation and Treatment: Rehabilitation  SUBJECTIVE:                                                                                                                                                                                             SUBJECTIVE STATEMENT: Pt doing ok today; no major updates. Pt just had blood work and said there was a lot of abnormals. She ate at [employer] in [place] which was a real treat.   PERTINENT HISTORY:   83yoF who is referred to OPPT for imbalance and multiple falls since 2018. Most recently sustained a pelvis fracture in 2025 s/p fall and Rt hip fracture s/p fall 2024. 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 Pt is currently back to her baseline which includes ad lib AMB without device, however she has not tolerated community distances in years due to chronic low back pain, is more recently limited by SOB and chest tightness with walking longer distances. Pt is fairly active at home, plans to leave here after evaluation visit and help her husband finish putting out more mulch at the house.   PAIN:  Are you having pain? no pain at rest.    PRECAUTIONS: falls  WEIGHT BEARING RESTRICTIONS: None  FALLS: Has patient fallen in last 6 months? Yes   LIVING ENVIRONMENT: Lives with: husband  Lives in: 2 level house, lives on main level  Stairs: 2 separate steps Has following equipment at home: WC, 5 canes, walker  PLOF: has recently rehabbed back to walking without a cane  PATIENT GOALS: stop falling   OBJECTIVE:  TREATMENT DATE 12/24/23 : -STS from chair hands free x10 -double heel raise x20 -double ankle  dorsum raise x15 -side stepping in // bars 4 laps each way with redTB, 2 hand support   -STS from chair hands free x10 -double heel raise x20 -double ankle dorsum raise x15 -side stepping in // bars 3 laps each way with greenTB, 2 hand support   -seated downward row 1x15 at 12.5lb (cable machine) (added 2lb freeweight)  -balance challenge on yoga mat: 2 inch step, 3 inch foam step, SBQC step over hurdle (8x, Minguard Assist)  -seated downward row 1x15 at 12.5lb (cable machine) (added 2lb freeweight)  -balance challenge on yoga mat: 2 inch step, 3 inch foam step, SBQC step over hurdle, 6 step (all with 3lb AW this time (8x, Minguard Assist)  -seated downward row 1x15 at 12.5lb (cable machine) (added 2lb freeweight)   -6 toe taps from airex foam pad surface x20, then 6 toe taps from firm surface x30  -lateral step up, step down x10 bilat    PATIENT EDUCATION: Education details: repeat explanation of vallet parking and volunteer services offering; explained how Rt hip instability is causing Left toe catching issues;  Person educated: Patient Education method: discussion  Education comprehension: Fair (some memory retention difficulty)   HOME EXERCISE PROGRAM: Will defer to visits 2 and 3   GOALS: Goals reviewed with patient? Yes   SHORT TERM GOALS: Target date: 01/10/24  Pt to demonstrate improved cervical rotation ROM right side >40 degrees to improve safety with scanning driving and reduce need for full body rotation during household activity.  Baseline: eval: 30 degrees Rt rotation.  Goal status: INITIAL  2.  Pt to demonstrate 5xSTS in <11.5sec hands free and without LOB to demonstrate improve BLE power.  Baseline:  Goal status: INITIAL  3.  Pt will demonstrate ability to hold tandem stance balance > 20sec bilat to demonstrate improved hip strength/proprioception for balance.  Baseline:  Goal status: INITIAL  LONG TERM GOALS: Target date: 02/10/24  Pt to improve Berg  Balance Test score to >52 to indicate reduced risk of falls.  Baseline: 45 Goal status: INITIAL  2.  Pt to demonstrate 5x STS <10.4sec hands free and without LOB to show improved power in BLE.  Baseline:  Goal status: INITIAL  3.  Pt to demonstrate ability to perform >60sec tandem stance bilat.  Baseline: <5secH  Goal status: INITIAL  4.  Pt to demonstrate improved Right cervical rotation ROM >46 degrees to improve safety with visual scanning in car and while working in yard.  Baseline: 30 degrees Goal status: INITIAL  ASSESSMENT:  CLINICAL IMPRESSION: Continued with strengthening exercises. Discussed recent BG: 44 and reminder to bring in her glucose tablets to PT sessions just in case (left in car this date.) Advanced balance interventions this date to challenge patient's stability with minA. No major aggravation of pain issues while exercising today.    Patient will benefit from skilled physical therapy intervention to reduce deficits and impairments identified in evaluation, in order to reduce pain, improve quality of life, and maximize activity tolerance for ADL, IADL, and leisure/fitness. Physical therapy will help pt achieve long and short term goals of care.   OBJECTIVE IMPAIRMENTS: Decreased knowledge of condition, decreased use of DME, decreased mobility, difficulty walking, decreased strength, decreased ROM. ACTIVITY LIMITATIONS: Lifting, standing, walking, squatting, transfers, locomotion level PARTICIPATION LIMITATIONS: Cleaning, laundry, interpersonal relationships, driving, yardwork, community activity.  PERSONAL FACTORS: Age, behavior pattern, education, past/current experiences, transportation, profession  are also affecting patient's functional outcome.  REHAB POTENTIAL: Good CLINICAL DECISION MAKING: Medium  EVALUATION COMPLEXITY: Moderate   PLAN:  PT FREQUENCY: 1-2x/week  PT DURATION: 8 weeks  PLANNED INTERVENTIONS: 97110-Therapeutic exercises, 97530-  Therapeutic activity, [id number]- Neuromuscular re-education, 97535- Self Care, 02859- Manual therapy, [phone]- Gait training, [phone]- Electrical stimulation (manual), Patient/Family education, Balance training, Stair training, Joint mobilization, Joint manipulation, DME instructions, Cryotherapy, and Moist heat  PLAN FOR NEXT SESSION: trial P/ROM cervical rotation ROM (clear any obvious soft tissue restrictions; establish HEP to improve SLS balance, tandem balance and Rt hip stabilization function.    Khadim Lundberg C, PT 12/24/2023, 11:55 AM  11:55 AM, 12/24/23 Peggye JAYSON Linear, PT, DPT Physical Therapist - [facility]  Outpatient Physical Therapy- Main Campus [phone]

## 2023-12-25 ENCOUNTER — Ambulatory Visit: Admitting: Nurse Practitioner

## 2023-12-25 ENCOUNTER — Encounter: Payer: Self-pay | Admitting: Nurse Practitioner

## 2023-12-25 VITALS — BP 136/70 | HR 80 | Temp 97.5°F | Resp 16 | Ht 66.0 in | Wt 138.0 lb

## 2023-12-25 DIAGNOSIS — E559 Vitamin D deficiency, unspecified: Secondary | ICD-10-CM

## 2023-12-25 DIAGNOSIS — E782 Mixed hyperlipidemia: Secondary | ICD-10-CM

## 2023-12-25 DIAGNOSIS — E538 Deficiency of other specified B group vitamins: Secondary | ICD-10-CM

## 2023-12-25 DIAGNOSIS — D696 Thrombocytopenia, unspecified: Secondary | ICD-10-CM

## 2023-12-25 DIAGNOSIS — E162 Hypoglycemia, unspecified: Secondary | ICD-10-CM

## 2023-12-25 DIAGNOSIS — D708 Other neutropenia: Secondary | ICD-10-CM

## 2023-12-25 DIAGNOSIS — I5032 Chronic diastolic (congestive) heart failure: Secondary | ICD-10-CM

## 2023-12-25 NOTE — Progress Notes (Signed)
 [facility] [address] [place], [place] [address]  Internal MEDICINE  Office Visit Note  Patient Name: Sonya Park  [id number]  [id number]  Date of Service: 12/25/2023  Chief Complaint  Patient presents with   Follow-up     HPI Winnell presents for an acute sick visit for low blood sugar.  Severely low glucose on nonfasting labs -- patient reports that she ate 3 pieces of bacon and 3 small pieces of bread before she had her labs drawn but her glucose was still 44.       Current Medication:  Outpatient Encounter Medications as of 12/25/2023  Medication Sig Note   acetaminophen  (TYLENOL ) 325 MG tablet Take 2 tablets (650 mg total) by mouth every 6 (six) hours as needed for mild pain (pain score 1-3) or fever.    aspirin  81 MG tablet Take 81 mg by mouth daily. Once a week (every Sunday)    Biotin (BIOTIN EXTRA STRENGTH) 10 MG CAPS Take by mouth.    calcium  citrate-vitamin D  (CITRACAL+D) 315-200 MG-UNIT tablet Take by mouth.    celecoxib (CELEBREX) 200 MG capsule Take 200 mg by mouth 2 (two) times daily.    Copper  Gluconate 2 MG CAPS Take 4 mg by mouth daily.    Cyanocobalamin  (B-12) 3000 MCG CAPS Take by mouth.    ferrous sulfate  (FEROSUL) 325 (65 FE) MG tablet Take 1 tablet (325 mg total) by mouth daily.    Melatonin 5 MG TABS Take 2.5 mg by mouth. As needed 06/16/2023: prn   Multiple Vitamins-Minerals (BARIATRIC MULTIVITAMINS/IRON PO) Take 2 capsules by mouth every morning. After breakfast.    oxyCODONE  (OXY IR/ROXICODONE ) 5 MG immediate release tablet Take 1 tablet (5 mg total) by mouth every 4 (four) hours as needed for severe pain (pain score 7-10).    pantoprazole  (PROTONIX ) 40 MG tablet Take 1 tablet (40 mg total) by mouth 2 (two) times daily before a meal.    PREVIDENT 5000 SENSITIVE 1.1-5 % GEL Place 1 Application onto teeth as directed. 06/16/2023: prn   PROLIA 60 MG/ML SOSY injection Inject 60 mg into the skin every 6 (six) months. 06/16/2023: Patient  states it's due now   spironolactone  (ALDACTONE ) 25 MG tablet Take 1 tablet (25 mg total) by mouth daily.    thiamine  100 MG tablet Take 1 tablet (100 mg total) daily by mouth.    No facility-administered encounter medications on file as of 12/25/2023.      Medical History: Past Medical History:  Diagnosis Date   Arthritis    Back pain    Insomnia    Medical history non-contributory    Osteoporosis      Vital Signs: BP 136/70   Pulse 80   Temp (!) 97.5 F (36.4 C)   Resp 16   Ht 5' 6 (1.676 m)   Wt 138 lb (62.6 kg)   SpO2 97%   BMI 22.27 kg/m    Review of Systems  Constitutional:  Positive for activity change and fatigue. Negative for chills and unexpected weight change.  HENT:  Negative for congestion, postnasal drip, rhinorrhea, sneezing and sore throat.   Eyes:  Negative for redness.  Respiratory: Negative.  Negative for cough, chest tightness and shortness of breath.   Cardiovascular: Negative.  Negative for chest pain and palpitations.  Gastrointestinal: Negative.  Negative for abdominal pain, constipation, diarrhea, nausea and vomiting.  Genitourinary:  Negative for dysuria and frequency.  Musculoskeletal:  Positive for arthralgias, back pain and gait problem.  Negative for joint swelling and neck pain.  Skin:  Negative for rash.  Neurological:  Positive for tremors. Negative for numbness.  Hematological:  Negative for adenopathy. Does not bruise/bleed easily.  Psychiatric/Behavioral:  Negative for behavioral problems (Depression), sleep disturbance and suicidal ideas. The patient is not nervous/anxious.     Physical Exam Vitals reviewed.  Constitutional:      General: She is not in acute distress.    Appearance: Normal appearance. She is normal weight. She is not ill-appearing.  HENT:     Head: Normocephalic and atraumatic.  Eyes:     Pupils: Pupils are equal, round, and reactive to light.  Cardiovascular:     Rate and Rhythm: Normal rate and regular  rhythm.  Pulmonary:     Effort: Pulmonary effort is normal. No respiratory distress.  Neurological:     Mental Status: She is alert and oriented to person, place, and time.  Psychiatric:        Mood and Affect: Mood normal.        Behavior: Behavior normal.       Assessment/Plan: 1. Hypoglycemia without diagnosis of diabetes mellitus (Primary) Additional labs ordered - Insulin , Free and Total - Hgb A1C w/o eAG - Basic Metabolic Panel (BMET) - TSH + free T4 - Vitamin D  (25 hydroxy) - Lipid Profile - B12 and Folate Panel - Iron, TIBC and Ferritin Panel  2. Chronic diastolic CHF (congestive heart failure) (HCC) Additional labs ordered - Insulin , Free and Total - Hgb A1C w/o eAG - Basic Metabolic Panel (BMET) - TSH + free T4 - Vitamin D  (25 hydroxy) - Lipid Profile - B12 and Folate Panel - Iron, TIBC and Ferritin Panel  3. Other neutropenia Additional labs ordered - Insulin , Free and Total - Hgb A1C w/o eAG - Basic Metabolic Panel (BMET) - TSH + free T4 - Vitamin D  (25 hydroxy) - Lipid Profile - B12 and Folate Panel - Iron, TIBC and Ferritin Panel  4. Thrombocytopenia Additional labs ordered - Insulin , Free and Total - Hgb A1C w/o eAG - Basic Metabolic Panel (BMET) - TSH + free T4 - Vitamin D  (25 hydroxy) - Lipid Profile - B12 and Folate Panel - Iron, TIBC and Ferritin Panel  5. Mixed hyperlipidemia Additional labs ordered - Insulin , Free and Total - Hgb A1C w/o eAG - Basic Metabolic Panel (BMET) - TSH + free T4 - Vitamin D  (25 hydroxy) - Lipid Profile - B12 and Folate Panel - Iron, TIBC and Ferritin Panel  6. B12 deficiency Additional labs ordered - Insulin , Free and Total - Hgb A1C w/o eAG - Basic Metabolic Panel (BMET) - TSH + free T4 - Vitamin D  (25 hydroxy) - Lipid Profile - B12 and Folate Panel - Iron, TIBC and Ferritin Panel  7. Vitamin D  deficiency Additional labs ordered - Insulin , Free and Total - Hgb A1C w/o eAG - Basic  Metabolic Panel (BMET) - TSH + free T4 - Vitamin D  (25 hydroxy) - Lipid Profile - B12 and Folate Panel - Iron, TIBC and Ferritin Panel   General Counseling: Brianda verbalizes understanding of the findings of todays visit and agrees with plan of treatment. I have discussed any further diagnostic evaluation that may be needed or ordered today. We also reviewed her medications today. she has been encouraged to call the office with any questions or concerns that should arise related to todays visit.    Counseling:    Orders Placed This Encounter  Procedures   Insulin , Free and Total  Hgb A1C w/o eAG   Basic Metabolic Panel (BMET)   TSH + free T4   Vitamin D  (25 hydroxy)   Lipid Profile   B12 and Folate Panel   Iron, TIBC and Ferritin Panel    No orders of the defined types were placed in this encounter.   Return in about 1 week (around 01/01/2024) for F/U, Labs, Sherryll Skoczylas PCP.  [place] Controlled Substance Database was reviewed by me for overdose risk score (ORS)  Time spent:30 Minutes Time spent with patient included reviewing progress notes, labs, imaging studies, and discussing plan for follow up.   This patient was seen by Mardy Maxin, FNP-C in collaboration with Dr. Sigrid Bathe as a part of collaborative care agreement.  Guyla Bless R. Maxin, MSN, FNP-C Internal Medicine

## 2023-12-26 ENCOUNTER — Telehealth: Payer: Self-pay

## 2023-12-26 NOTE — Telephone Encounter (Signed)
 Called [facility] and add on Insulin free and total

## 2023-12-30 ENCOUNTER — Ambulatory Visit

## 2023-12-31 ENCOUNTER — Ambulatory Visit: Admitting: Nurse Practitioner

## 2023-12-31 ENCOUNTER — Ambulatory Visit

## 2023-12-31 DIAGNOSIS — R296 Repeated falls: Secondary | ICD-10-CM

## 2023-12-31 DIAGNOSIS — R2681 Unsteadiness on feet: Secondary | ICD-10-CM

## 2023-12-31 DIAGNOSIS — M6281 Muscle weakness (generalized): Secondary | ICD-10-CM

## 2023-12-31 LAB — CBC WITH DIFFERENTIAL/PLATELET
Basophils Absolute: 0.1 x10E3/uL (ref 0.0–0.2)
Basos: 1 %
EOS (ABSOLUTE): 0.2 x10E3/uL (ref 0.0–0.4)
Eos: 3 %
Hematocrit: 39.1 % (ref 34.0–46.6)
Hemoglobin: 12.5 g/dL (ref 11.1–15.9)
Immature Grans (Abs): 0.1 x10E3/uL (ref 0.0–0.1)
Immature Granulocytes: 1 %
Lymphocytes Absolute: 1.3 x10E3/uL (ref 0.7–3.1)
Lymphs: 19 %
MCH: 30.8 pg (ref 26.6–33.0)
MCHC: 32 g/dL (ref 31.5–35.7)
MCV: 96 fL (ref 79–97)
Monocytes Absolute: 0.8 x10E3/uL (ref 0.1–0.9)
Monocytes: 11 %
Neutrophils Absolute: 4.5 x10E3/uL (ref 1.4–7.0)
Neutrophils: 65 %
Platelets: 260 x10E3/uL (ref 150–450)
RBC: 4.06 x10E6/uL (ref 3.77–5.28)
RDW: 12.2 % (ref 11.7–15.4)
WBC: 6.8 x10E3/uL (ref 3.4–10.8)

## 2023-12-31 LAB — VITAMIN B1: Thiamine: 318.9 nmol/L — ABNORMAL HIGH (ref 66.5–200.0)

## 2023-12-31 LAB — CMP14+EGFR
ALT: 16 IU/L (ref 0–32)
AST: 23 IU/L (ref 0–40)
Albumin: 4.3 g/dL (ref 3.7–4.7)
Alkaline Phosphatase: 67 IU/L (ref 48–129)
BUN/Creatinine Ratio: 28 (ref 12–28)
BUN: 24 mg/dL (ref 8–27)
Bilirubin Total: 0.4 mg/dL (ref 0.0–1.2)
CO2: 25 mmol/L (ref 20–29)
Calcium: 9.2 mg/dL (ref 8.7–10.3)
Chloride: 104 mmol/L (ref 96–106)
Creatinine, Ser: 0.86 mg/dL (ref 0.57–1.00)
Globulin, Total: 1.5 g/dL (ref 1.5–4.5)
Glucose: 89 mg/dL (ref 70–99)
Potassium: 4.7 mmol/L (ref 3.5–5.2)
Sodium: 142 mmol/L (ref 134–144)
Total Protein: 5.8 g/dL — ABNORMAL LOW (ref 6.0–8.5)
eGFR: 67 mL/min/1.73 (ref >59)

## 2023-12-31 LAB — IRON,TIBC AND FERRITIN PANEL
Ferritin: 326 ng/mL — ABNORMAL HIGH (ref 15–150)
Iron Saturation: 32 % (ref 15–55)
Iron: 99 ug/dL (ref 27–139)
Total Iron Binding Capacity: 309 ug/dL (ref 250–450)
UIBC: 210 ug/dL (ref 118–369)

## 2023-12-31 LAB — LIPID PANEL
Chol/HDL Ratio: 2.3 ratio (ref 0.0–4.4)
Cholesterol, Total: 196 mg/dL (ref 100–199)
HDL: 85 mg/dL (ref >39)
LDL Chol Calc (NIH): 90 mg/dL (ref 0–99)
Triglycerides: 122 mg/dL (ref 0–149)
VLDL Cholesterol Cal: 21 mg/dL (ref 5–40)

## 2023-12-31 LAB — TSH+FREE T4
Free T4: 1.36 ng/dL (ref 0.82–1.77)
TSH: 1.39 u[IU]/mL (ref 0.450–4.500)

## 2023-12-31 LAB — B12 AND FOLATE PANEL
Folate: >20 ng/mL (ref >3.0)
Vitamin B-12: >2000 pg/mL — ABNORMAL HIGH (ref 232–1245)

## 2023-12-31 LAB — HGB A1C W/O EAG: Hgb A1c MFr Bld: 4.7 % — ABNORMAL LOW (ref 4.8–5.6)

## 2023-12-31 LAB — VITAMIN D 25 HYDROXY (VIT D DEFICIENCY, FRACTURES): Vit D, 25-Hydroxy: 26.5 ng/mL — ABNORMAL LOW (ref 30.0–100.0)

## 2023-12-31 NOTE — Therapy (Signed)
 OUTPATIENT PHYSICAL THERAPY TREATMENT  Patient Name: Sonya Park MRN: [id number] DOB:1941/01/10, 83 y.o., female Today's Date: 12/31/2023  PCP: Liana Fish, NP REFERRING PROVIDER: Maree Jannett POUR, MD  END OF SESSION:  PT End of Session - 12/31/23 1538     Visit Number 4    Number of Visits 16    Date for Recertification  02/05/24    Authorization Type [employer] Medicare    Authorization Time Period 12/11/23-02/05/24    Progress Note Due on Visit 10    PT Start Time 1535    PT Stop Time 1615    PT Time Calculation (min) 40 min    Equipment Utilized During Treatment Gait belt    Activity Tolerance Patient tolerated treatment well;No increased pain;Patient limited by fatigue    Behavior During Therapy [facility] for tasks assessed/performed          Past Medical History:  Diagnosis Date   Arthritis    Back pain    Insomnia    Medical history non-contributory    Osteoporosis    Past Surgical History:  Procedure Laterality Date   ABDOMINAL HYSTERECTOMY     BREAST LUMPECTOMY Right    COLONOSCOPY WITH PROPOFOL  N/A 07/18/2015   Procedure: COLONOSCOPY WITH PROPOFOL ;  Surgeon: Rogelia Copping, MD;  Location: [facility] ENDOSCOPY;  Service: Endoscopy;  Laterality: N/A;   COSMETIC SURGERY     GASTRIC BYPASS     X2   HEMORROIDECTOMY     KNEE ARTHROPLASTY Right 12/13/2015   Procedure: COMPUTER ASSISTED TOTAL KNEE ARTHROPLASTY;  Surgeon: Lynwood SHAUNNA Hue, MD;  Location: [facility] ORS;  Service: Orthopedics;  Laterality: Right;   KNEE ARTHROSCOPY     REPLACEMENT TOTAL KNEE Left    SHOULDER SURGERY Right    Patient Active Problem List   Diagnosis Date Noted   Tremor of right hand 11/24/2023   Sacral back pain 11/24/2023   Closed fracture of left inferior pubic ramus (HCC) 06/25/2023   Closed fracture of left distal radius 06/14/2023   Closed pelvic fracture (HCC) 06/13/2023   HTN (hypertension) 06/13/2023   Chronic diastolic CHF (congestive heart failure) (HCC) 06/13/2023   Wrist fracture,  closed, left, initial encounter 06/13/2023   Vitamin D  deficiency 04/12/2023   Hypocalcemia 04/12/2023   Iron deficiency anemia 04/16/2022   Closed displaced intertrochanteric fracture of right femur (HCC) 03/05/2022   Closed fracture of proximal end of right humerus 03/05/2022   Fracture of right hip (HCC) 02/03/2022   Shoulder fracture, right, closed, initial encounter 02/03/2022   Fall at home, initial encounter 02/03/2022   Neck pain 10/24/2021   Irritable bowel syndrome 10/14/2021   Obstructive sleep apnea of adult 10/14/2021   Vulvovaginitis 07/27/2021   Hypoglycemia 04/11/2021   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 01/16/2021   Vertigo 06/09/2020   Rotator cuff arthropathy of right shoulder 03/22/2020   Hyperlipidemia 02/21/2020   Need for COVID-19 vaccine 02/14/2020   Essential hypertension 09/02/2019   Seasonal allergic rhinitis due to pollen 09/02/2019   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) 11/17/2017   Lymphedema 08/11/2017   Anemia, macrocytic, nutritional    Macrocytic anemia    Dysphagia    Itching    Screening for colon cancer    LFT elevation    Severe malnutrition    Adult failure to thrive    Hyperbilirubinemia    Alcoholic liver failure (HCC)    Thrombocytopenia    Pressure  injury of skin 01/19/2017   Bacteremia    Palliative care by specialist    Goals of care, counseling/discussion    Alcohol abuse 01/15/2017   Facial trauma 01/14/2017   Osteoporosis 01/14/2017   Neutropenia 01/14/2017   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   Special screening for malignant neoplasms, colon    Benign neoplasm of transverse colon    Fracture of right inferior pubic ramus (HCC) 09/17/2014   Compression fracture of L3 vertebra (HCC) 09/17/2014   Closed wedge compression fracture of third lumbar vertebra (HCC) 09/17/2014   Other specified fracture of  right pubis, initial encounter for closed fracture (HCC) 09/17/2014   ONSET DATE: chronic REFERRING DIAG: falls THERAPY DIAG:  Repeated falls  Unsteadiness on feet  Muscle weakness (generalized)  Rationale for Evaluation and Treatment: Rehabilitation  SUBJECTIVE:                                                                                                                                                                                             SUBJECTIVE STATEMENT: Pt was hungry on the way here so she stopped and got a burrito.   PERTINENT HISTORY:   83yoF who is referred to OPPT for imbalance and multiple falls since 2018. Most recently sustained a pelvis fracture in 2025 s/p fall and Rt hip fracture s/p fall 2024. 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 Pt is currently back to her baseline which includes ad lib AMB without device, however she has not tolerated community distances in years due to chronic low back pain, is more recently limited by SOB and chest tightness with walking longer distances. Pt is fairly active at home, plans to leave here after evaluation visit and help her husband finish putting out more mulch at the house.   PAIN:  Are you having pain? no pain at rest.    PRECAUTIONS: falls  WEIGHT BEARING RESTRICTIONS: None  FALLS: Has patient fallen in last 6 months? Yes   LIVING ENVIRONMENT: Lives with: husband  Lives in: 2 level house, lives on main level  Stairs: 2 separate steps Has following equipment at home: WC, 5 canes, walker  PLOF: has recently rehabbed back to walking without a cane  PATIENT GOALS: stop falling   OBJECTIVE:  TREATMENT DATE 12/31/23 : -STS from chair hands free x10 -double heel raise x20 -double ankle dorsum raise x15 -side stepping in // bars 3 laps each way with greenTB, 2 hand support  -seated downward row  1x15 at 12.5lb (cable machine) (added 2lb freeweight)  -seated RedTB clam x15  -STS from chair hands free x12 -double heel raise x20 -double ankle dorsum raise x15 -side stepping in // bars 3 laps each way with greenTB, 2 hand support  -seated downward row 1x15 at 12.5lb (cable machine) (added 2lb freeweight)  -seated RedTB clam x15  -standing airex foam x60sec eyes closed (several LOB backward, delayed but adequate awareness, grabs railings without opening eyes)  -standing rocker board balance eye open x60sec -standing on flipped half roll x60sec (ankle rocker) may be too difficult   Rest   -standing airex foam x60sec eyes closed (several LOB backward, delayed but adequate awareness, grabs railings without opening eyes)  -standing rocker board balance eye open x60sec -standing on flipped half roll x60sec (ankle rocker) may be too difficult    PATIENT EDUCATION: Education details: repeat explanation of [employer] parking and volunteer services offering; explained how Rt hip instability is causing Left toe catching issues;  Person educated: Patient Education method: discussion  Education comprehension: Fair (some memory retention difficulty)   HOME EXERCISE PROGRAM:  None GOALS: Goals reviewed with patient? Yes   SHORT TERM GOALS: Target date: 01/10/24  Pt to demonstrate improved cervical rotation ROM right side >40 degrees to improve safety with scanning driving and reduce need for full body rotation during household activity.  Baseline: eval: 30 degrees Rt rotation.  Goal status: INITIAL  2.  Pt to demonstrate 5xSTS in <11.5sec hands free and without LOB to demonstrate improve BLE power.  Baseline:  Goal status: INITIAL  3.  Pt will demonstrate ability to hold tandem stance balance > 20sec bilat to demonstrate improved hip strength/proprioception for balance.  Baseline:  Goal status: INITIAL  LONG TERM GOALS: Target date: 02/10/24  Pt to improve Berg Balance Test score to  >52 to indicate reduced risk of falls.  Baseline: 45 Goal status: INITIAL  2.  Pt to demonstrate 5x STS <10.4sec hands free and without LOB to show improved power in BLE.  Baseline:  Goal status: INITIAL  3.  Pt to demonstrate ability to perform >60sec tandem stance bilat.  Baseline: <5secH  Goal status: INITIAL  4.  Pt to demonstrate improved Right cervical rotation ROM >46 degrees to improve safety with visual scanning in car and while working in yard.  Baseline: 30 degrees Goal status: INITIAL  ASSESSMENT:  CLINICAL IMPRESSION: Continued with strengthening exercises. Reminded pt to bring in glucose tablets for sessions. Advanced balance interventions this date to challenge patient's stability with minA. No major aggravation of pain issues while exercising today. Patient will benefit from skilled physical therapy intervention to reduce deficits and impairments identified in evaluation, in order to reduce pain, improve quality of life, and maximize activity tolerance for ADL, IADL, and leisure/fitness. Physical therapy will help pt achieve long and short term goals of care.   OBJECTIVE IMPAIRMENTS: Decreased knowledge of condition, decreased use of DME, decreased mobility, difficulty walking, decreased strength, decreased ROM. ACTIVITY LIMITATIONS: Lifting, standing, walking, squatting, transfers, locomotion level PARTICIPATION LIMITATIONS: Cleaning, laundry, interpersonal relationships, driving, yardwork, community activity.  PERSONAL FACTORS: Age, behavior pattern, education, past/current experiences, transportation, profession  are also affecting patient's functional outcome.  REHAB POTENTIAL: Good CLINICAL DECISION MAKING: Medium  EVALUATION COMPLEXITY: Moderate  PLAN:  PT FREQUENCY: 1-2x/week  PT DURATION: 8 weeks  PLANNED INTERVENTIONS: 97110-Therapeutic exercises, 97530- Therapeutic activity, [id number]- Neuromuscular re-education, 97535- Self Care, [id number]- Manual therapy,  [phone]- Gait training, [phone]- Electrical stimulation (manual), Patient/Family education, Balance training, Stair training, Joint mobilization, Joint manipulation, DME instructions, Cryotherapy, and Moist heat  PLAN FOR NEXT SESSION: trial P/ROM cervical rotation ROM (clear any obvious soft tissue restrictions; establish HEP to improve SLS balance, tandem balance and Rt hip stabilization function.    Naleyah Ohlinger C, PT 12/31/2023, 3:39 PM  3:39 PM, 12/31/23 Peggye JAYSON Linear, PT, DPT Physical Therapist - [facility]  Outpatient Physical Therapy- Main Campus [phone]

## 2024-01-02 ENCOUNTER — Ambulatory Visit (INDEPENDENT_AMBULATORY_CARE_PROVIDER_SITE_OTHER): Admitting: Nurse Practitioner

## 2024-01-02 ENCOUNTER — Encounter: Payer: Self-pay | Admitting: Nurse Practitioner

## 2024-01-02 VITALS — BP 123/71 | HR 79 | Temp 97.4°F | Resp 16 | Ht 66.0 in | Wt 138.8 lb

## 2024-01-02 DIAGNOSIS — E162 Hypoglycemia, unspecified: Secondary | ICD-10-CM | POA: Diagnosis not present

## 2024-01-02 DIAGNOSIS — E559 Vitamin D deficiency, unspecified: Secondary | ICD-10-CM | POA: Diagnosis not present

## 2024-01-02 DIAGNOSIS — R7989 Other specified abnormal findings of blood chemistry: Secondary | ICD-10-CM | POA: Diagnosis not present

## 2024-01-02 NOTE — Progress Notes (Signed)
 [facility] [address] [place], [place] [address]  Internal MEDICINE  Office Visit Note  Patient Name: Sonya Park  [id number]  [id number]  Date of Service: 01/02/2024  Chief Complaint  Patient presents with   Follow-up    Review labs    HPI [facility] presents for a follow-up visit for lab results.  Glucose is normal now, insulin  level is still pending. A1c is still low but increased to 4.7 Elevated ferritin level, normal iron level.  Elevated thiamine  level  Low vitamin D  level at 26.5 but has increased by 4 since last level checked.  CBC, thyroid  and cholesterol panel are normal.     Current Medication: Outpatient Encounter Medications as of 01/02/2024  Medication Sig Note   acetaminophen  (TYLENOL ) 325 MG tablet Take 2 tablets (650 mg total) by mouth every 6 (six) hours as needed for mild pain (pain score 1-3) or fever.    aspirin  81 MG tablet Take 81 mg by mouth daily. Once a week (every Sunday)    Biotin (BIOTIN EXTRA STRENGTH) 10 MG CAPS Take by mouth.    calcium  citrate-vitamin D  (CITRACAL+D) 315-200 MG-UNIT tablet Take by mouth.    celecoxib (CELEBREX) 200 MG capsule Take 200 mg by mouth 2 (two) times daily.    Copper  Gluconate 2 MG CAPS Take 4 mg by mouth daily.    Cyanocobalamin  (B-12) 3000 MCG CAPS Take by mouth.    ferrous sulfate  (FEROSUL) 325 (65 FE) MG tablet Take 1 tablet (325 mg total) by mouth daily.    Melatonin 5 MG TABS Take 2.5 mg by mouth. As needed 06/16/2023: prn   Multiple Vitamins-Minerals (BARIATRIC MULTIVITAMINS/IRON PO) Take 2 capsules by mouth every morning. After breakfast.    oxyCODONE  (OXY IR/ROXICODONE ) 5 MG immediate release tablet Take 1 tablet (5 mg total) by mouth every 4 (four) hours as needed for severe pain (pain score 7-10).    pantoprazole  (PROTONIX ) 40 MG tablet Take 1 tablet (40 mg total) by mouth 2 (two) times daily before a meal.    PREVIDENT 5000 SENSITIVE 1.1-5 % GEL Place 1 Application onto teeth as directed.  06/16/2023: prn   PROLIA 60 MG/ML SOSY injection Inject 60 mg into the skin every 6 (six) months. 06/16/2023: Patient states it's due now   spironolactone  (ALDACTONE ) 25 MG tablet Take 1 tablet (25 mg total) by mouth daily.    thiamine  100 MG tablet Take 1 tablet (100 mg total) daily by mouth.    No facility-administered encounter medications on file as of 01/02/2024.    Surgical History: Past Surgical History:  Procedure Laterality Date   ABDOMINAL HYSTERECTOMY     BREAST LUMPECTOMY Right    COLONOSCOPY WITH PROPOFOL  N/A 07/18/2015   Procedure: COLONOSCOPY WITH PROPOFOL ;  Surgeon: Sonya Copping, MD;  Location: [facility] END[facility]COPY;  Service: Endoscopy;  Laterality: N/A;   COSMETIC SURGERY     GASTRIC BYPASS     X2   HEMORROIDECTOMY     KNEE ARTHROPLASTY Right 12/13/2015   Procedure: COMPUTER ASSISTED TOTAL KNEE ARTHROPLASTY;  Surgeon: Sonya SHAUNNA Hue, MD;  Location: [facility] ORS;  Service: Orthopedics;  Laterality: Right;   KNEE ARTHROSCOPY     REPLACEMENT TOTAL KNEE Left    SHOULDER SURGERY Right     Medical History: Past Medical History:  Diagnosis Date   Arthritis    Back pain    Insomnia    Medical history non-contributory    Osteoporosis     Family History: Family History  Problem Relation  Age of Onset   Liver disease Mother    Heart attack Father    Cancer Father    CAD Sister     Social History   Socioeconomic History   Marital status: Married    Spouse name: Sonya Park   Number of children: 1   Years of education: 12   Highest education level: Some college, no degree  Occupational History   Occupation: Retired  Tobacco Use   Smoking status: Former    Current packs/day: 0.00    Average packs/day: 2.0 packs/day for 29.0 years (58.0 ttl pk-yrs)    Types: Cigarettes    Start date: 11/29/1965    Quit date: 11/30/1994    Years since quitting: 29.1   Smokeless tobacco: Never  Vaping Use   Vaping status: Never Used  Substance and Sexual Activity   Alcohol use:  Not Currently    Alcohol/week: 21.0 - 35.0 standard drinks of alcohol    Types: 21 - 35 Glasses of wine per week    Comment: 3-5 glasses of wine/ day   Drug use: No   Sexual activity: Not Currently  Other Topics Concern   Not on file  Social History Narrative   Not on file   Social Drivers of Health   Financial Resource Strain: Low Risk  (10/14/2023)   Received from [facility]   Overall Financial Resource Strain (CARDIA)    Difficulty of Paying Living Expenses: Not hard at all  Food Insecurity: No Food Insecurity (10/14/2023)   Received from [facility]   Hunger Vital Sign    Within the past 12 months, you worried that your food would run out before you got the money to buy more.: Never true    Within the past 12 months, the food you bought just didn't last and you didn't have money to get more.: Never true  Transportation Needs: No Transportation Needs (10/14/2023)   Received from [facility] - Transportation    In the past 12 months, has lack of transportation kept you from medical appointments or from getting medications?: No    Lack of Transportation (Non-Medical): No  Physical Activity: Inactive (03/14/2022)   Exercise Vital Sign    Days of Exercise per Week: 0 days    Minutes of Exercise per Session: 0 min  Stress: Stress Concern Present (03/14/2022)   [employer] [facility] Occupational [employer] - Occupational Stress Questionnaire    Feeling of Stress : To some extent  Social Connections: Moderately Isolated (06/13/2023)   Social Connection and Isolation Panel    Frequency of Communication with Friends and Family: More than three times a week    Frequency of Social Gatherings with Friends and Family: Once a week    Attends Religious Services: Never    Database administrator or Organizations: No    Attends Banker Meetings: Never    Marital Status: Married  Catering manager Violence: Not At Risk (06/13/2023)    Humiliation, Afraid, Rape, and Kick questionnaire    Fear of Current or Ex-Partner: No    Emotionally Abused: No    Physically Abused: No    Sexually Abused: No      Review of Systems  Constitutional:  Positive for activity change and fatigue. Negative for chills and unexpected weight change.  HENT:  Negative for congestion, postnasal drip, rhinorrhea, sneezing and sore throat.   Eyes:  Negative for redness.  Respiratory: Negative.  Negative  for cough, chest tightness and shortness of breath.   Cardiovascular: Negative.  Negative for chest pain and palpitations.  Gastrointestinal: Negative.  Negative for abdominal pain, constipation, diarrhea, nausea and vomiting.  Genitourinary:  Negative for dysuria and frequency.  Musculoskeletal:  Positive for arthralgias, back pain and gait problem. Negative for joint swelling and neck pain.  Skin:  Negative for rash.  Neurological:  Positive for tremors. Negative for numbness.  Hematological:  Negative for adenopathy. Does not bruise/bleed easily.  Psychiatric/Behavioral:  Negative for behavioral problems (Depression), sleep disturbance and suicidal ideas. The patient is not nervous/anxious.     Vital Signs: BP 123/71   Pulse 79   Temp (!) 97.4 F (36.3 C)   Resp 16   Ht 5' 6 (1.676 m)   Wt 138 lb 12.8 oz (63 kg)   SpO2 97%   BMI 22.40 kg/m    Physical Exam Vitals reviewed.  Constitutional:      General: She is not in acute distress.    Appearance: Normal appearance. She is normal weight. She is not ill-appearing.  HENT:     Head: Normocephalic and atraumatic.  Eyes:     Pupils: Pupils are equal, round, and reactive to light.  Cardiovascular:     Rate and Rhythm: Normal rate and regular rhythm.  Pulmonary:     Effort: Pulmonary effort is normal. No respiratory distress.  Neurological:     Mental Status: She is alert and oriented to person, place, and time.  Psychiatric:        Mood and Affect: Mood normal.         Behavior: Behavior normal.        Assessment/Plan: 1. Hypoglycemia without diagnosis of diabetes mellitus (Primary) Glucose is is normal range. A1c is slightly low but improved. Waiting for insulin  level which is pending.   2. Elevated ferritin Will continue to monitor and check in a few months   3. Vitamin D  deficiency Continue vitamin D  supplement.    General Counseling: Julicia verbalizes understanding of the findings of todays visit and agrees with plan of treatment. I have discussed any further diagnostic evaluation that may be needed or ordered today. We also reviewed her medications today. she has been encouraged to call the office with any questions or concerns that should arise related to todays visit.    No orders of the defined types were placed in this encounter.   No orders of the defined types were placed in this encounter.   Return for previously scheduled, AWV, Jahara Dail PCP in october..   Total time spent:30 Minutes Time spent includes review of chart, medications, test results, and follow up plan with the patient.   [place] Controlled Substance Database was reviewed by me.  This patient was seen by Mardy Maxin, FNP-C in collaboration with Dr. Sigrid Bathe as a part of collaborative care agreement.   Arminda Foglio R. Maxin, MSN, FNP-C Internal medicine

## 2024-01-06 ENCOUNTER — Ambulatory Visit

## 2024-01-06 DIAGNOSIS — R2681 Unsteadiness on feet: Secondary | ICD-10-CM

## 2024-01-06 DIAGNOSIS — R296 Repeated falls: Secondary | ICD-10-CM | POA: Diagnosis not present

## 2024-01-06 DIAGNOSIS — M6281 Muscle weakness (generalized): Secondary | ICD-10-CM

## 2024-01-06 NOTE — Therapy (Signed)
 OUTPATIENT PHYSICAL THERAPY TREATMENT  Patient Name: Sonya Park MRN: [id number] DOB:1940/10/07, 83 y.o., female Today's Date: 01/06/2024  PCP: Liana Fish, NP REFERRING PROVIDER: Maree Jannett POUR, MD  END OF SESSION:  PT End of Session - 01/06/24 1022     Visit Number 5    Number of Visits 16    Date for Recertification  02/05/24    Authorization Type [employer] Medicare    Authorization Time Period 12/11/23-02/05/24    Progress Note Due on Visit 10    PT Start Time 1016    PT Stop Time 1056    PT Time Calculation (min) 40 min    Equipment Utilized During Treatment Gait belt    Activity Tolerance Patient tolerated treatment well;No increased pain;Patient limited by fatigue    Behavior During Therapy [facility] for tasks assessed/performed          Past Medical History:  Diagnosis Date   Arthritis    Back pain    Insomnia    Medical history non-contributory    Osteoporosis    Past Surgical History:  Procedure Laterality Date   ABDOMINAL HYSTERECTOMY     BREAST LUMPECTOMY Right    COLONOSCOPY WITH PROPOFOL  N/A 07/18/2015   Procedure: COLONOSCOPY WITH PROPOFOL ;  Surgeon: Rogelia Copping, MD;  Location: [facility] END[facility]COPY;  Service: Endoscopy;  Laterality: N/A;   COSMETIC SURGERY     GASTRIC BYPASS     X2   HEMORROIDECTOMY     KNEE ARTHROPLASTY Right 12/13/2015   Procedure: COMPUTER ASSISTED TOTAL KNEE ARTHROPLASTY;  Surgeon: Lynwood SHAUNNA Hue, MD;  Location: [facility] ORS;  Service: Orthopedics;  Laterality: Right;   KNEE ARTHROSCOPY     REPLACEMENT TOTAL KNEE Left    SHOULDER SURGERY Right    Patient Active Problem List   Diagnosis Date Noted   Tremor of right hand 11/24/2023   Sacral back pain 11/24/2023   Closed fracture of left inferior pubic ramus (HCC) 06/25/2023   Closed fracture of left distal radius 06/14/2023   Closed pelvic fracture (HCC) 06/13/2023   HTN (hypertension) 06/13/2023   Chronic diastolic CHF (congestive heart failure) (HCC) 06/13/2023   Wrist fracture,  closed, left, initial encounter 06/13/2023   Vitamin D  deficiency 04/12/2023   Hypocalcemia 04/12/2023   Iron deficiency anemia 04/16/2022   Closed displaced intertrochanteric fracture of right femur (HCC) 03/05/2022   Closed fracture of proximal end of right humerus 03/05/2022   Fracture of right hip (HCC) 02/03/2022   Shoulder fracture, right, closed, initial encounter 02/03/2022   Fall at home, initial encounter 02/03/2022   Neck pain 10/24/2021   Irritable bowel syndrome 10/14/2021   Obstructive sleep apnea of adult 10/14/2021   Vulvovaginitis 07/27/2021   Hypoglycemia 04/11/2021   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 01/16/2021   Vertigo 06/09/2020   Rotator cuff arthropathy of right shoulder 03/22/2020   Hyperlipidemia 02/21/2020   Need for COVID-19 vaccine 02/14/2020   Essential hypertension 09/02/2019   Seasonal allergic rhinitis due to pollen 09/02/2019   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) 11/17/2017   Lymphedema 08/11/2017   Anemia, macrocytic, nutritional    Macrocytic anemia    Dysphagia    Itching    Screening for colon cancer    LFT elevation    Severe malnutrition    Adult failure to thrive    Hyperbilirubinemia    Alcoholic liver failure (HCC)    Thrombocytopenia    Pressure  injury of skin 01/19/2017   Bacteremia    Palliative care by specialist    Goals of care, counseling/discussion    Alcohol abuse 01/15/2017   Facial trauma 01/14/2017   Osteoporosis 01/14/2017   Neutropenia 01/14/2017   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   Special screening for malignant neoplasms, colon    Benign neoplasm of transverse colon    Fracture of right inferior pubic ramus (HCC) 09/17/2014   Compression fracture of L3 vertebra (HCC) 09/17/2014   Closed wedge compression fracture of third lumbar vertebra (HCC) 09/17/2014   Other specified fracture of  right pubis, initial encounter for closed fracture (HCC) 09/17/2014   ONSET DATE: chronic REFERRING DIAG: falls THERAPY DIAG:  Repeated falls  Unsteadiness on feet  Muscle weakness (generalized)  Rationale for Evaluation and Treatment: Rehabilitation  SUBJECTIVE:                                                                                                                                                                                             SUBJECTIVE STATEMENT: Pt doing well today. Unremarkable weekend. Worked in [employer].   PERTINENT HISTORY:   83yoF who is referred to OPPT for imbalance and multiple falls since 2018. Most recently sustained a pelvis fracture in 2025 s/p fall and Rt hip fracture s/p fall 2024. 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 Pt is currently back to her baseline which includes ad lib AMB without device, however she has not tolerated community distances in years due to chronic low back pain, is more recently limited by SOB and chest tightness with walking longer distances. Pt is fairly active at home, plans to leave here after evaluation visit and help her husband finish putting out more mulch at the house.   PAIN:  Are you having pain? no pain at rest.    PRECAUTIONS: falls  WEIGHT BEARING RESTRICTIONS: None  FALLS: Has patient fallen in last 6 months? Yes   LIVING ENVIRONMENT: Lives with: husband  Lives in: 2 level house, lives on main level  Stairs: 2 separate steps Has following equipment at home: WC, 5 canes, walker  PLOF: has recently rehabbed back to walking without a cane  PATIENT GOALS: stop falling   OBJECTIVE:  TREATMENT DATE 01/06/24 : -STS from chair with 9lb free weight x10  -seated neck flexion + full range rotation 20x alternating -alternating step taps x30 at stairs, hands free -staggered stance RED row 8x  each way -seated neck flexion + full range rotation 20x alternating -STS from chair with 9lb free weight x10  -step taps alternating x30 holding blue ball at chest (3kg)  -alternate tandem stance at support bar, hands free 2x45sec bilat  -Airex pad stance with alternate head turns x16 (ROM remains quite poor)  -Airex pad with balloon dribbling using a PVC tube x2 minutes   12/31/23 -STS from chair hands free x10 -double heel raise x20 -double ankle dorsum raise x15 -side stepping in // bars 3 laps each way with greenTB, 2 hand support  -seated downward row 1x15 at 12.5lb (cable machine) (added 2lb freeweight)  -seated RedTB clam x15  -STS from chair hands free x12 -double heel raise x20 -double ankle dorsum raise x15 -side stepping in // bars 3 laps each way with greenTB, 2 hand support  -seated downward row 1x15 at 12.5lb (cable machine) (added 2lb freeweight)  -seated RedTB clam x15  -standing airex foam x60sec eyes closed (several LOB backward, delayed but adequate awareness, grabs railings without opening eyes)  -standing rocker board balance eye open x60sec -standing on flipped half roll x60sec (ankle rocker) may be too difficult   Rest   -standing airex foam x60sec eyes closed (several LOB backward, delayed but adequate awareness, grabs railings without opening eyes)  -standing rocker board balance eye open x60sec -standing on flipped half roll x60sec (ankle rocker) may be too difficult    PATIENT EDUCATION: Education details: repeat explanation of vallet parking and volunteer services offering; explained how Rt hip instability is causing Left toe catching issues;  Person educated: Patient Education method: discussion  Education comprehension: Fair (some memory retention difficulty)   HOME EXERCISE PROGRAM:  None GOALS: Goals reviewed with patient? Yes   SHORT TERM GOALS: Target date: 01/10/24  Pt to demonstrate improved cervical rotation ROM right side >40 degrees  to improve safety with scanning driving and reduce need for full body rotation during household activity.  Baseline: eval: 30 degrees Rt rotation.  Goal status: INITIAL  2.  Pt to demonstrate 5xSTS in <11.5sec hands free and without LOB to demonstrate improve BLE power.  Baseline:  Goal status: INITIAL  3.  Pt will demonstrate ability to hold tandem stance balance > 20sec bilat to demonstrate improved hip strength/proprioception for balance.  Baseline:  Goal status: INITIAL  LONG TERM GOALS: Target date: 02/10/24  Pt to improve Berg Balance Test score to >52 to indicate reduced risk of falls.  Baseline: 45 Goal status: INITIAL  2.  Pt to demonstrate 5x STS <10.4sec hands free and without LOB to show improved power in BLE.  Baseline:  Goal status: PROGRESSING  3.  Pt to demonstrate ability to perform >60sec tandem stance bilat.  Baseline: <5secH  Goal status: INITIAL  4.  Pt to demonstrate improved Right cervical rotation ROM >46 degrees to improve safety with visual scanning in car and while working in yard.  Baseline: 30 degrees Goal status: INITIAL  ASSESSMENT:  CLINICAL IMPRESSION: Continued with strengthening exercises, with a shift toward more balance training today. Advanced balance interventions this date to challenge patient's stability with minA. No major aggravation of pain issues while exercising today. Patient will benefit from skilled physical therapy intervention to reduce deficits and impairments identified in evaluation, in order to  reduce pain, improve quality of life, and maximize activity tolerance for ADL, IADL, and leisure/fitness. Physical therapy will help pt achieve long and short term goals of care.   OBJECTIVE IMPAIRMENTS: Decreased knowledge of condition, decreased use of DME, decreased mobility, difficulty walking, decreased strength, decreased ROM. ACTIVITY LIMITATIONS: Lifting, standing, walking, squatting, transfers, locomotion level PARTICIPATION  LIMITATIONS: Cleaning, laundry, interpersonal relationships, driving, yardwork, community activity.  PERSONAL FACTORS: Age, behavior pattern, education, past/current experiences, transportation, profession  are also affecting patient's functional outcome.  REHAB POTENTIAL: Good CLINICAL DECISION MAKING: Medium  EVALUATION COMPLEXITY: Moderate   PLAN:  PT FREQUENCY: 1-2x/week  PT DURATION: 8 weeks  PLANNED INTERVENTIONS: 97110-Therapeutic exercises, 97530- Therapeutic activity, 97112- Neuromuscular re-education, 97535- Self Care, [id number]- Manual therapy, [phone]- Gait training, [phone]- Electrical stimulation (manual), Patient/Family education, Balance training, Stair training, Joint mobilization, Joint manipulation, DME instructions, Cryotherapy, and Moist heat  PLAN FOR NEXT SESSIONBETHA Emelia Peggye JAYSON, PT 01/06/2024, 10:24 AM  10:24 AM, 01/06/24 Peggye JAYSON Emelia, PT, DPT Physical Therapist - [place] [facility]  Outpatient Physical Therapy- Main Campus [phone]

## 2024-01-08 ENCOUNTER — Ambulatory Visit: Attending: Neurology

## 2024-01-08 DIAGNOSIS — R296 Repeated falls: Secondary | ICD-10-CM | POA: Diagnosis present

## 2024-01-08 DIAGNOSIS — M6281 Muscle weakness (generalized): Secondary | ICD-10-CM | POA: Diagnosis present

## 2024-01-08 DIAGNOSIS — R2681 Unsteadiness on feet: Secondary | ICD-10-CM | POA: Diagnosis present

## 2024-01-08 LAB — SPECIMEN STATUS REPORT

## 2024-01-08 LAB — INSULIN, FREE AND TOTAL
Free Insulin: 5.9 uU/mL
Total Insulin: 5.9 uU/mL

## 2024-01-08 NOTE — Therapy (Signed)
 OUTPATIENT PHYSICAL THERAPY TREATMENT  Patient Name: Sonya Park MRN: [id number] DOB:1940/08/21, 83 y.o., female Today's Date: 01/08/2024  PCP: Liana Fish, NP REFERRING PROVIDER: Maree Jannett POUR, MD  END OF SESSION:  PT End of Session - 01/08/24 1109     Visit Number 6    Number of Visits 16    Date for Recertification  02/05/24    Authorization Type [employer]    Authorization Time Period 12/11/23-02/05/24    Progress Note Due on Visit 10    PT Start Time 1100    PT Stop Time 1140    PT Time Calculation (min) 40 min    Equipment Utilized During Treatment Gait belt    Activity Tolerance Patient tolerated treatment well;No increased pain;Patient limited by fatigue    Behavior During Therapy [facility] for tasks assessed/performed          Past Medical History:  Diagnosis Date   Arthritis    Back pain    Insomnia    Medical history non-contributory    Osteoporosis    Past Surgical History:  Procedure Laterality Date   ABDOMINAL HYSTERECTOMY     BREAST LUMPECTOMY Right    COLONOSCOPY WITH PROPOFOL  N/A 07/18/2015   Procedure: COLONOSCOPY WITH PROPOFOL ;  Surgeon: Rogelia Copping, MD;  Location: [facility] ENDOSCOPY;  Service: Endoscopy;  Laterality: N/A;   COSMETIC SURGERY     GASTRIC BYPASS     X2   HEMORROIDECTOMY     KNEE ARTHROPLASTY Right 12/13/2015   Procedure: COMPUTER ASSISTED TOTAL KNEE ARTHROPLASTY;  Surgeon: Lynwood SHAUNNA Hue, MD;  Location: [facility] ORS;  Service: Orthopedics;  Laterality: Right;   KNEE ARTHROSCOPY     REPLACEMENT TOTAL KNEE Left    SHOULDER SURGERY Right    Patient Active Problem List   Diagnosis Date Noted   Tremor of right hand 11/24/2023   Sacral back pain 11/24/2023   Closed fracture of left inferior pubic ramus (HCC) 06/25/2023   Closed fracture of left distal radius 06/14/2023   Closed pelvic fracture (HCC) 06/13/2023   HTN (hypertension) 06/13/2023   Chronic diastolic CHF (congestive heart failure) (HCC) 06/13/2023   Wrist fracture,  closed, left, initial encounter 06/13/2023   Vitamin D  deficiency 04/12/2023   Hypocalcemia 04/12/2023   Iron deficiency anemia 04/16/2022   Closed displaced intertrochanteric fracture of right femur (HCC) 03/05/2022   Closed fracture of proximal end of right humerus 03/05/2022   Fracture of right hip (HCC) 02/03/2022   Shoulder fracture, right, closed, initial encounter 02/03/2022   Fall at home, initial encounter 02/03/2022   Neck pain 10/24/2021   Irritable bowel syndrome 10/14/2021   Obstructive sleep apnea of adult 10/14/2021   Vulvovaginitis 07/27/2021   Hypoglycemia 04/11/2021   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 01/16/2021   Vertigo 06/09/2020   Rotator cuff arthropathy of right shoulder 03/22/2020   Hyperlipidemia 02/21/2020   Need for COVID-19 vaccine 02/14/2020   Essential hypertension 09/02/2019   Seasonal allergic rhinitis due to pollen 09/02/2019   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) 11/17/2017   Lymphedema 08/11/2017   Anemia, macrocytic, nutritional    Macrocytic anemia    Dysphagia    Itching    Screening for colon cancer    LFT elevation    Severe malnutrition    Adult failure to thrive    Hyperbilirubinemia    Alcoholic liver failure (HCC)    Thrombocytopenia    Pressure  injury of skin 01/19/2017   Bacteremia    Palliative care by specialist    Goals of care, counseling/discussion    Alcohol abuse 01/15/2017   Facial trauma 01/14/2017   Osteoporosis 01/14/2017   Neutropenia 01/14/2017   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   Special screening for malignant neoplasms, colon    Benign neoplasm of transverse colon    Fracture of right inferior pubic ramus (HCC) 09/17/2014   Compression fracture of L3 vertebra (HCC) 09/17/2014   Closed wedge compression fracture of third lumbar vertebra (HCC) 09/17/2014   Other specified fracture of  right pubis, initial encounter for closed fracture (HCC) 09/17/2014   ONSET DATE: chronic REFERRING DIAG: falls THERAPY DIAG:  Repeated falls  Unsteadiness on feet  Muscle weakness (generalized)  Rationale for Evaluation and Treatment: Rehabilitation  SUBJECTIVE:                                                                                                                                                                                             SUBJECTIVE STATEMENT: Pt doing well today. A little tired from a 2 day trip with [employer] to [place].    PERTINENT HISTORY:   83yoF who is referred to OPPT for imbalance and multiple falls since 2018. Most recently sustained a pelvis fracture in 2025 s/p fall and Rt hip fracture s/p fall 2024. 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 Pt is currently back to her baseline which includes ad lib AMB without device, however she has not tolerated community distances in years due to chronic low back pain, is more recently limited by SOB and chest tightness with walking longer distances. Pt is fairly active at home, plans to leave here after evaluation visit and help her husband finish putting out more mulch at the house.   PAIN:  Are you having pain? no pain at rest.    PRECAUTIONS: falls  WEIGHT BEARING RESTRICTIONS: None  FALLS: Has patient fallen in last 6 months? Yes   LIVING ENVIRONMENT: Lives with: husband  Lives in: 2 level house, lives on main level  Stairs: 2 separate steps Has following equipment at home: WC, 5 canes, walker  PLOF: has recently rehabbed back to walking without a cane  PATIENT GOALS: stop falling   OBJECTIVE:  TREATMENT DATE 01/08/24 : -overground AMB 462ft, no device 55m45sec -STS from chair x12 c 3kg ball  -STS from chair x12 c 3kg ball  -side stepping in // bars 3 laps each way with BTB, 2 hand  support -side stepping in // bars 4 laps each way with BTB, 2 hand support  -2x4 balance beam walking in // bars  -forward step taps, lateral step taps, medical step taps: 8x each per leg, holding  -airex pad ball toss, rebound, chest press green ball x10 each  -Airex pad with balloon dribbling using a PVC tube x2 minutes -25lb flour sack draggin 54ft: 4 different pulls both forward adnbackwar d  Prior Session -STS from chair with 9lb free weight x10  -seated neck flexion + full range rotation 20x alternating -alternating step taps x30 at stairs, hands free -staggered stance RED row 8x each way -seated neck flexion + full range rotation 20x alternating -STS from chair with 9lb free weight x10  -step taps alternating x30 holding blue ball at chest (3kg)  -alternate tandem stance at support bar, hands free 2x45sec bilat  -Airex pad stance with alternate head turns x16 (ROM remains quite poor)  -Airex pad with balloon dribbling using a PVC tube x2 minutes  12/31/23 -STS from chair hands free x10 -double heel raise x20 -double ankle dorsum raise x15 -side stepping in // bars 3 laps each way with greenTB, 2 hand support  -seated downward row 1x15 at 12.5lb (cable machine) (added 2lb freeweight)  -seated RedTB clam x15  -STS from chair hands free x12 -double heel raise x20 -double ankle dorsum raise x15 -side stepping in // bars 3 laps each way with greenTB, 2 hand support  -seated downward row 1x15 at 12.5lb (cable machine) (added 2lb freeweight)  -seated RedTB clam x15  -standing airex foam x60sec eyes closed (several LOB backward, delayed but adequate awareness, grabs railings without opening eyes)  -standing rocker board balance eye open x60sec -standing on flipped half roll x60sec (ankle rocker) may be too difficult   Rest  -standing airex foam x60sec eyes closed (several LOB backward, delayed but adequate awareness, grabs railings without opening eyes)  -standing rocker board  balance eye open x60sec -standing on flipped half roll x60sec (ankle rocker) may be too difficult   PATIENT EDUCATION: Education details: repeat explanation of vallet parking and volunteer services offering; explained how Rt hip instability is causing Left toe catching issues;  Person educated: Patient Education method: discussion  Education comprehension: Fair (some memory retention difficulty)   HOME EXERCISE PROGRAM:  None GOALS: Goals reviewed with patient? Yes   SHORT TERM GOALS: Target date: 01/10/24  Pt to demonstrate improved cervical rotation ROM right side >40 degrees to improve safety with scanning driving and reduce need for full body rotation during household activity.  Baseline: eval: 30 degrees Rt rotation.  Goal status: INITIAL  2.  Pt to demonstrate 5xSTS in <11.5sec hands free and without LOB to demonstrate improve BLE power.  Baseline:  Goal status: INITIAL  3.  Pt will demonstrate ability to hold tandem stance balance > 20sec bilat to demonstrate improved hip strength/proprioception for balance.  Baseline:  Goal status: INITIAL  LONG TERM GOALS: Target date: 02/10/24  Pt to improve Berg Balance Test score to >52 to indicate reduced risk of falls.  Baseline: 45 Goal status: INITIAL  2.  Pt to demonstrate 5x STS <10.4sec hands free and without LOB to show improved power in BLE.  Baseline:  Goal status: PROGRESSING  3.  Pt to demonstrate ability to perform >60sec tandem stance bilat.  Baseline: <5secH  Goal status: INITIAL  4.  Pt to demonstrate improved Right cervical rotation ROM >46 degrees to improve safety with visual scanning in car and while working in yard.  Baseline: 30 degrees Goal status: INITIAL  ASSESSMENT:  CLINICAL IMPRESSION: Continued with strengthening exercises and balance training today. Advanced balance interventions this date to challenge patient's stability with minA. No major aggravation of pain issues while exercising today.  Patient will benefit from skilled physical therapy intervention to reduce deficits and impairments identified in evaluation, in order to reduce pain, improve quality of life, and maximize activity tolerance for ADL, IADL, and leisure/fitness. Physical therapy will help pt achieve long and short term goals of care.   OBJECTIVE IMPAIRMENTS: Decreased knowledge of condition, decreased use of DME, decreased mobility, difficulty walking, decreased strength, decreased ROM. ACTIVITY LIMITATIONS: Lifting, standing, walking, squatting, transfers, locomotion level PARTICIPATION LIMITATIONS: Cleaning, laundry, interpersonal relationships, driving, yardwork, community activity.  PERSONAL FACTORS: Age, behavior pattern, education, past/current experiences, transportation, profession  are also affecting patient's functional outcome.  REHAB POTENTIAL: Good CLINICAL DECISION MAKING: Medium  EVALUATION COMPLEXITY: Moderate   PLAN:  PT FREQUENCY: 1-2x/week  PT DURATION: 8 weeks  PLANNED INTERVENTIONS: 97110-Therapeutic exercises, 97530- Therapeutic activity, 97112- Neuromuscular re-education, 97535- Self Care, 02859- Manual therapy, [phone]- Gait training, [phone]- Electrical stimulation (manual), Patient/Family education, Balance training, Stair training, Joint mobilization, Joint manipulation, DME instructions, Cryotherapy, and Moist heat  PLAN FOR NEXT SESSION:     Bayler Nehring C, PT 01/08/2024, 11:11 AM  11:11 AM, 01/08/24 Peggye JAYSON Linear, PT, DPT Physical Therapist - [place] [facility]  Outpatient Physical Therapy- [facility] [phone]

## 2024-01-13 ENCOUNTER — Ambulatory Visit

## 2024-01-14 ENCOUNTER — Ambulatory Visit

## 2024-01-14 NOTE — Therapy (Incomplete)
 OUTPATIENT PHYSICAL THERAPY TREATMENT  Patient Name: COLINE CALKIN MRN: [id number] DOB:1941-01-17, 83 y.o., female Today's Date: 01/14/2024  PCP: Liana Fish, NP REFERRING PROVIDER: Maree Jannett POUR, MD  END OF SESSION:    Past Medical History:  Diagnosis Date   Arthritis    Back pain    Insomnia    Medical history non-contributory    Osteoporosis    Past Surgical History:  Procedure Laterality Date   ABDOMINAL HYSTERECTOMY     BREAST LUMPECTOMY Right    COLONOSCOPY WITH PROPOFOL  N/A 07/18/2015   Procedure: COLONOSCOPY WITH PROPOFOL ;  Surgeon: Rogelia Copping, MD;  Location: [facility] ENDOSCOPY;  Service: Endoscopy;  Laterality: N/A;   COSMETIC SURGERY     GASTRIC BYPASS     X2   HEMORROIDECTOMY     KNEE ARTHROPLASTY Right 12/13/2015   Procedure: COMPUTER ASSISTED TOTAL KNEE ARTHROPLASTY;  Surgeon: Lynwood SHAUNNA Hue, MD;  Location: [facility] ORS;  Service: Orthopedics;  Laterality: Right;   KNEE ARTHROSCOPY     REPLACEMENT TOTAL KNEE Left    SHOULDER SURGERY Right    Patient Active Problem List   Diagnosis Date Noted   Tremor of right hand 11/24/2023   Sacral back pain 11/24/2023   Closed fracture of left inferior pubic ramus (HCC) 06/25/2023   Closed fracture of left distal radius 06/14/2023   Closed pelvic fracture (HCC) 06/13/2023   HTN (hypertension) 06/13/2023   Chronic diastolic CHF (congestive heart failure) (HCC) 06/13/2023   Wrist fracture, closed, left, initial encounter 06/13/2023   Vitamin D  deficiency 04/12/2023   Hypocalcemia 04/12/2023   Iron deficiency anemia 04/16/2022   Closed displaced intertrochanteric fracture of right femur (HCC) 03/05/2022   Closed fracture of proximal end of right humerus 03/05/2022   Fracture of right hip (HCC) 02/03/2022   Shoulder fracture, right, closed, initial encounter 02/03/2022   Fall at home, initial encounter 02/03/2022   Neck pain 10/24/2021   Irritable bowel syndrome 10/14/2021   Obstructive sleep apnea of adult  10/14/2021   Vulvovaginitis 07/27/2021   Hypoglycemia 04/11/2021   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 01/16/2021   Vertigo 06/09/2020   Rotator cuff arthropathy of right shoulder 03/22/2020   Hyperlipidemia 02/21/2020   Need for COVID-19 vaccine 02/14/2020   Essential hypertension 09/02/2019   Seasonal allergic rhinitis due to pollen 09/02/2019   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) 11/17/2017   Lymphedema 08/11/2017   Anemia, macrocytic, nutritional    Macrocytic anemia    Dysphagia    Itching    Screening for colon cancer    LFT elevation    Severe malnutrition    Adult failure to thrive    Hyperbilirubinemia    Alcoholic liver failure (HCC)    Thrombocytopenia    Pressure injury of skin 01/19/2017   Bacteremia    Palliative care by specialist    Goals of care, counseling/discussion    Alcohol abuse 01/15/2017   Facial trauma 01/14/2017   Osteoporosis 01/14/2017   Neutropenia 01/14/2017   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  Rationale for Evaluation and Treatment: Rehabilitation  SUBJECTIVE:                                                                                                                                                                                             SUBJECTIVE STATEMENT: Pt doing well today. A little tired from a 2 day trip with [employer] to [place].    PERTINENT  HISTORY:   83yoF who is referred to OPPT for imbalance and multiple falls since 2018. Most recently sustained a pelvis fracture in 2025 s/p fall and Rt hip fracture s/p fall 2024. 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 Pt is currently back to her baseline which includes ad lib AMB without device, however she has not tolerated community distances in years due to chronic low back pain, is more recently limited by SOB and chest tightness with walking longer distances. Pt is fairly active at home, plans to leave here after evaluation visit and help her husband finish putting out more mulch at the house.   PAIN:  Are you having pain? no pain at rest.    PRECAUTIONS: falls  WEIGHT BEARING RESTRICTIONS: None  FALLS: Has patient fallen in last 6 months? Yes   LIVING ENVIRONMENT: Lives with: husband  Lives in: 2 level house, lives on main level  Stairs: 2 separate steps Has following equipment at home: WC, 5 canes, walker  PLOF: has recently rehabbed back to walking without a cane  PATIENT GOALS: stop falling   OBJECTIVE:                                                                                                                             TREATMENT DATE 01/14/24 : -overground AMB 450ft, no device 50m45sec -STS from chair x12 c 3kg ball  -STS from chair x12 c 3kg ball  -side stepping in // bars 3 laps each way with BTB, 2 hand support -side stepping in // bars 4 laps each way with BTB, 2 hand support  -2x4 balance beam  walking in // bars  -forward step taps, lateral step taps, medical step taps: 8x each per leg, holding  -airex pad ball toss, rebound, chest press green ball x10 each  -Airex pad with balloon dribbling using a PVC tube x2 minutes -25lb flour sack draggin 49ft: 4 different pulls both forward adnbackwar d  Prior Session -STS from chair with 9lb free weight x10  -seated neck flexion + full range rotation 20x alternating -alternating step taps x30 at stairs, hands  free -staggered stance RED row 8x each way -seated neck flexion + full range rotation 20x alternating -STS from chair with 9lb free weight x10  -step taps alternating x30 holding blue ball at chest (3kg)  -alternate tandem stance at support bar, hands free 2x45sec bilat  -Airex pad stance with alternate head turns x16 (ROM remains quite poor)  -Airex pad with balloon dribbling using a PVC tube x2 minutes  12/31/23 -STS from chair hands free x10 -double heel raise x20 -double ankle dorsum raise x15 -side stepping in // bars 3 laps each way with greenTB, 2 hand support  -seated downward row 1x15 at 12.5lb (cable machine) (added 2lb freeweight)  -seated RedTB clam x15  -STS from chair hands free x12 -double heel raise x20 -double ankle dorsum raise x15 -side stepping in // bars 3 laps each way with greenTB, 2 hand support  -seated downward row 1x15 at 12.5lb (cable machine) (added 2lb freeweight)  -seated RedTB clam x15  -standing airex foam x60sec eyes closed (several LOB backward, delayed but adequate awareness, grabs railings without opening eyes)  -standing rocker board balance eye open x60sec -standing on flipped half roll x60sec (ankle rocker) may be too difficult   Rest  -standing airex foam x60sec eyes closed (several LOB backward, delayed but adequate awareness, grabs railings without opening eyes)  -standing rocker board balance eye open x60sec -standing on flipped half roll x60sec (ankle rocker) may be too difficult   PATIENT EDUCATION: Education details: repeat explanation of [employer] parking and volunteer services offering; explained how Rt hip instability is causing Left toe catching issues;  Person educated: Patient Education method: discussion  Education comprehension: Fair (some memory retention difficulty)   HOME EXERCISE PROGRAM:  None GOALS: Goals reviewed with patient? Yes   SHORT TERM GOALS: Target date: 01/10/24  Pt to demonstrate improved cervical  rotation ROM right side >40 degrees to improve safety with scanning driving and reduce need for full body rotation during household activity.  Baseline: eval: 30 degrees Rt rotation.  Goal status: INITIAL  2.  Pt to demonstrate 5xSTS in <11.5sec hands free and without LOB to demonstrate improve BLE power.  Baseline:  Goal status: INITIAL  3.  Pt will demonstrate ability to hold tandem stance balance > 20sec bilat to demonstrate improved hip strength/proprioception for balance.  Baseline:  Goal status: INITIAL  LONG TERM GOALS: Target date: 02/10/24  Pt to improve Berg Balance Test score to >52 to indicate reduced risk of falls.  Baseline: 45 Goal status: INITIAL  2.  Pt to demonstrate 5x STS <10.4sec hands free and without LOB to show improved power in BLE.  Baseline:  Goal status: PROGRESSING  3.  Pt to demonstrate ability to perform >60sec tandem stance bilat.  Baseline: <5secH  Goal status: INITIAL  4.  Pt to demonstrate improved Right cervical rotation ROM >46 degrees to improve safety with visual scanning in car and while working in yard.  Baseline: 30 degrees Goal status: INITIAL  ASSESSMENT:  CLINICAL IMPRESSION:  Continued with strengthening exercises and balance training today. Advanced balance interventions this date to challenge patient's stability with minA. No major aggravation of pain issues while exercising today. Patient will benefit from skilled physical therapy intervention to reduce deficits and impairments identified in evaluation, in order to reduce pain, improve quality of life, and maximize activity tolerance for ADL, IADL, and leisure/fitness. Physical therapy will help pt achieve long and short term goals of care.   OBJECTIVE IMPAIRMENTS: Decreased knowledge of condition, decreased use of DME, decreased mobility, difficulty walking, decreased strength, decreased ROM. ACTIVITY LIMITATIONS: Lifting, standing, walking, squatting, transfers, locomotion  level PARTICIPATION LIMITATIONS: Cleaning, laundry, interpersonal relationships, driving, yardwork, community activity.  PERSONAL FACTORS: Age, behavior pattern, education, past/current experiences, transportation, profession  are also affecting patient's functional outcome.  REHAB POTENTIAL: Good CLINICAL DECISION MAKING: Medium  EVALUATION COMPLEXITY: Moderate   PLAN:  PT FREQUENCY: 1-2x/week  PT DURATION: 8 weeks  PLANNED INTERVENTIONS: 97110-Therapeutic exercises, 97530- Therapeutic activity, 97112- Neuromuscular re-education, 97535- Self Care, 02859- Manual therapy, [phone]- Gait training, [phone]- Electrical stimulation (manual), Patient/Family education, Balance training, Stair training, Joint mobilization, Joint manipulation, DME instructions, Cryotherapy, and Moist heat  PLAN FOR NEXT SESSION:     Reyes LOISE London, PT 01/14/2024, 8:02 AM  8:02 AM, 01/14/24 Chyrl London, PT Physical Therapist - [place] [facility]  Outpatient Physical Therapy- Main Campus [phone]

## 2024-01-16 ENCOUNTER — Ambulatory Visit

## 2024-01-16 DIAGNOSIS — M6281 Muscle weakness (generalized): Secondary | ICD-10-CM

## 2024-01-16 DIAGNOSIS — R296 Repeated falls: Secondary | ICD-10-CM

## 2024-01-16 DIAGNOSIS — R2681 Unsteadiness on feet: Secondary | ICD-10-CM

## 2024-01-16 NOTE — Therapy (Signed)
 OUTPATIENT PHYSICAL THERAPY TREATMENT  Patient Name: Sonya Park MRN: [id number] DOB:Sep 01, 1940, 83 y.o., female Today's Date: 01/16/2024  PCP: Liana Fish, NP REFERRING PROVIDER: Maree Jannett POUR, MD  END OF SESSION:  PT End of Session - 01/16/24 1022     Visit Number 7    Number of Visits 16    Date for Recertification  02/05/24    Authorization Type [employer] Medicare    Authorization Time Period 12/11/23-02/05/24    Progress Note Due on Visit 10    PT Start Time 1015    PT Stop Time 1057    PT Time Calculation (min) 42 min    Equipment Utilized During Treatment Gait belt    Activity Tolerance Patient tolerated treatment well;No increased pain;Patient limited by fatigue    Behavior During Therapy [facility] for tasks assessed/performed           Past Medical History:  Diagnosis Date   Arthritis    Back pain    Insomnia    Medical history non-contributory    Osteoporosis    Past Surgical History:  Procedure Laterality Date   ABDOMINAL HYSTERECTOMY     BREAST LUMPECTOMY Right    COLONOSCOPY WITH PROPOFOL  N/A 07/18/2015   Procedure: COLONOSCOPY WITH PROPOFOL ;  Surgeon: Rogelia Copping, MD;  Location: [facility] ENDOSCOPY;  Service: Endoscopy;  Laterality: N/A;   COSMETIC SURGERY     GASTRIC BYPASS     X2   HEMORROIDECTOMY     KNEE ARTHROPLASTY Right 12/13/2015   Procedure: COMPUTER ASSISTED TOTAL KNEE ARTHROPLASTY;  Surgeon: Lynwood SHAUNNA Hue, MD;  Location: [facility] ORS;  Service: Orthopedics;  Laterality: Right;   KNEE ARTHROSCOPY     REPLACEMENT TOTAL KNEE Left    SHOULDER SURGERY Right    Patient Active Problem List   Diagnosis Date Noted   Tremor of right hand 11/24/2023   Sacral back pain 11/24/2023   Closed fracture of left inferior pubic ramus (HCC) 06/25/2023   Closed fracture of left distal radius 06/14/2023   Closed pelvic fracture (HCC) 06/13/2023   HTN (hypertension) 06/13/2023   Chronic diastolic CHF (congestive heart failure) (HCC) 06/13/2023   Wrist fracture,  closed, left, initial encounter 06/13/2023   Vitamin D  deficiency 04/12/2023   Hypocalcemia 04/12/2023   Iron deficiency anemia 04/16/2022   Closed displaced intertrochanteric fracture of right femur (HCC) 03/05/2022   Closed fracture of proximal end of right humerus 03/05/2022   Fracture of right hip (HCC) 02/03/2022   Shoulder fracture, right, closed, initial encounter 02/03/2022   Fall at home, initial encounter 02/03/2022   Neck pain 10/24/2021   Irritable bowel syndrome 10/14/2021   Obstructive sleep apnea of adult 10/14/2021   Vulvovaginitis 07/27/2021   Hypoglycemia 04/11/2021   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 01/16/2021   Vertigo 06/09/2020   Rotator cuff arthropathy of right shoulder 03/22/2020   Hyperlipidemia 02/21/2020   Need for COVID-19 vaccine 02/14/2020   Essential hypertension 09/02/2019   Seasonal allergic rhinitis due to pollen 09/02/2019   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) 11/17/2017   Lymphedema 08/11/2017   Anemia, macrocytic, nutritional    Macrocytic anemia    Dysphagia    Itching    Screening for colon cancer    LFT elevation    Severe malnutrition    Adult failure to thrive    Hyperbilirubinemia    Alcoholic liver failure (HCC)    Thrombocytopenia  Pressure injury of skin 01/19/2017   Bacteremia    Palliative care by specialist    Goals of care, counseling/discussion    Alcohol abuse 01/15/2017   Facial trauma 01/14/2017   Osteoporosis 01/14/2017   Neutropenia 01/14/2017   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   Special screening for malignant neoplasms, colon    Benign neoplasm of transverse colon    Fracture of right inferior pubic ramus (HCC) 09/17/2014   Compression fracture of L3 vertebra (HCC) 09/17/2014   Closed wedge compression fracture of third lumbar vertebra (HCC) 09/17/2014   Other specified fracture of  right pubis, initial encounter for closed fracture (HCC) 09/17/2014   ONSET DATE: chronic REFERRING DIAG: falls THERAPY DIAG:  Repeated falls  Unsteadiness on feet  Muscle weakness (generalized)  Rationale for Evaluation and Treatment: Rehabilitation  SUBJECTIVE:                                                                                                                                                                                             SUBJECTIVE STATEMENT: Pt reports doing okay- States she is no stranger to PT. Reports she is very active- Not really performing any specific HEP at home.   PERTINENT HISTORY:   83yoF who is referred to OPPT for imbalance and multiple falls since 2018. Most recently sustained a pelvis fracture in 2025 s/p fall and Rt hip fracture s/p fall 2024. 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 Pt is currently back to her baseline which includes ad lib AMB without device, however she has not tolerated community distances in years due to chronic low back pain, is more recently limited by SOB and chest tightness with walking longer distances. Pt is fairly active at home, plans to leave here after evaluation visit and help her husband finish putting out more mulch at the house.   PAIN:  Are you having pain? no pain at rest.    PRECAUTIONS: falls  WEIGHT BEARING RESTRICTIONS: None  FALLS: Has patient fallen in last 6 months? Yes   LIVING ENVIRONMENT: Lives with: husband  Lives in: 2 level house, lives on main level  Stairs: 2 separate steps Has following equipment at home: WC, 5 canes, walker  PLOF: has recently rehabbed back to walking without a cane  PATIENT GOALS: stop falling   OBJECTIVE:  TREATMENT DATE 01/16/24 :  Today:              TA: -Nustep - interval training to promote LE strength/cardio resp endurance. PT sets up  intervention, adjusts intensity throughout and monitors pt for response. Cuing for SPM/speed, pt maintains SPM in 50's. Seat and arm level 7; Resistance- L1-3 today for 6 min. Patient reported she could really feel her R shoulder- feels like it needs it.  NMR: -Step tap onto 1st step without UE support x 20 reps alt LE -Step up 3#AW alt LE without UE support x 20 reps  - blaze pod activity- 6 pods (3 positioned near wall on floor and 3 overhead on mirror wall) - 1 min duration - patient would either use her UE or LE to tap appropriate color pod. The Blaze Pod Random setting was chosen to enhance cognitive processing and agility, providing an unpredictable environment to simulate real-world scenarios, and fostering quick reactions and adaptability x 5 trials with hit count improving from 11 to 19.  -blaze pod activity- positioned 6 pods on floor (10 x 4 feet) - random setting again with foot tapping- 1 min round x 5 including  forward, diagonal forward or bwd, side stepping, and retro stepping. -Close CGA throughout and most difficulty with retro steps -Resistive gait 3# AW each LE x 300 feet- CGA and VC to pick your feet up with observed decreased bilateral foot clearance with audible scuffing of shoes.      Prior session -overground AMB 442ft, no device 28m45sec -STS from chair x12 c 3kg ball  -STS from chair x12 c 3kg ball  -side stepping in // bars 3 laps each way with BTB, 2 hand support -side stepping in // bars 4 laps each way with BTB, 2 hand support  -2x4 balance beam walking in // bars  -forward step taps, lateral step taps, medical step taps: 8x each per leg, holding  -airex pad ball toss, rebound, chest press green ball x10 each  -Airex pad with balloon dribbling using a PVC tube x2 minutes -25lb flour sack draggin 76ft: 4 different pulls both forward adnbackwar d  Prior Session -STS from chair with 9lb free weight x10  -seated neck flexion + full range rotation 20x  alternating -alternating step taps x30 at stairs, hands free -staggered stance RED row 8x each way -seated neck flexion + full range rotation 20x alternating -STS from chair with 9lb free weight x10  -step taps alternating x30 holding blue ball at chest (3kg)  -alternate tandem stance at support bar, hands free 2x45sec bilat  -Airex pad stance with alternate head turns x16 (ROM remains quite poor)  -Airex pad with balloon dribbling using a PVC tube x2 minutes  12/31/23 -STS from chair hands free x10 -double heel raise x20 -double ankle dorsum raise x15 -side stepping in // bars 3 laps each way with greenTB, 2 hand support  -seated downward row 1x15 at 12.5lb (cable machine) (added 2lb freeweight)  -seated RedTB clam x15  -STS from chair hands free x12 -double heel raise x20 -double ankle dorsum raise x15 -side stepping in // bars 3 laps each way with greenTB, 2 hand support  -seated downward row 1x15 at 12.5lb (cable machine) (added 2lb freeweight)  -seated RedTB clam x15  -standing airex foam x60sec eyes closed (several LOB backward, delayed but adequate awareness, grabs railings without opening eyes)  -standing rocker board balance eye open x60sec -standing on flipped half roll x60sec (ankle rocker) may be  too difficult   Rest  -standing airex foam x60sec eyes closed (several LOB backward, delayed but adequate awareness, grabs railings without opening eyes)  -standing rocker board balance eye open x60sec -standing on flipped half roll x60sec (ankle rocker) may be too difficult   PATIENT EDUCATION: Education details: repeat explanation of vallet parking and volunteer services offering; explained how Rt hip instability is causing Left toe catching issues;  Person educated: Patient Education method: discussion  Education comprehension: Fair (some memory retention difficulty)   HOME EXERCISE PROGRAM:   GOALS: Goals reviewed with patient? Yes   SHORT TERM GOALS: Target date:  01/10/24  Pt to demonstrate improved cervical rotation ROM right side >40 degrees to improve safety with scanning driving and reduce need for full body rotation during household activity.  Baseline: eval: 30 degrees Rt rotation.  Goal status: INITIAL  2.  Pt to demonstrate 5xSTS in <11.5sec hands free and without LOB to demonstrate improve BLE power.  Baseline:  Goal status: INITIAL  3.  Pt will demonstrate ability to hold tandem stance balance > 20sec bilat to demonstrate improved hip strength/proprioception for balance.  Baseline:  Goal status: INITIAL  LONG TERM GOALS: Target date: 02/10/24  Pt to improve Berg Balance Test score to >52 to indicate reduced risk of falls.  Baseline: 45 Goal status: INITIAL  2.  Pt to demonstrate 5x STS <10.4sec hands free and without LOB to show improved power in BLE.  Baseline:  Goal status: PROGRESSING  3.  Pt to demonstrate ability to perform >60sec tandem stance bilat.  Baseline: <5secH  Goal status: INITIAL  4.  Pt to demonstrate improved Right cervical rotation ROM >46 degrees to improve safety with visual scanning in car and while working in yard.  Baseline: 30 degrees Goal status: INITIAL  ASSESSMENT:  CLINICAL IMPRESSION: Continued with current plan of progressing balance as appropriate. Patient performed well overall today- did demo some limitations with right shoulder ROM with overhead reaching during blaze pod activity but quickly adapted to task without any significant LOB. She demonstrated most unsteadiness today with retro stepping and decreased overall foot clearance with resistive walking and will benefit from further training to promote safe mobility.  Patient will benefit from skilled physical therapy intervention to reduce deficits and impairments identified in evaluation, in order to reduce pain, improve quality of life, and maximize activity tolerance for ADL, IADL, and leisure/fitness. Physical therapy will help pt achieve  long and short term goals of care.   OBJECTIVE IMPAIRMENTS: Decreased knowledge of condition, decreased use of DME, decreased mobility, difficulty walking, decreased strength, decreased ROM. ACTIVITY LIMITATIONS: Lifting, standing, walking, squatting, transfers, locomotion level PARTICIPATION LIMITATIONS: Cleaning, laundry, interpersonal relationships, driving, yardwork, community activity.  PERSONAL FACTORS: Age, behavior pattern, education, past/current experiences, transportation, profession  are also affecting patient's functional outcome.  REHAB POTENTIAL: Good CLINICAL DECISION MAKING: Medium  EVALUATION COMPLEXITY: Moderate   PLAN:  PT FREQUENCY: 1-2x/week  PT DURATION: 8 weeks  PLANNED INTERVENTIONS: 97110-Therapeutic exercises, 97530- Therapeutic activity, [id number]- Neuromuscular re-education, 97535- Self Care, [id number]- Manual therapy, [phone]- Gait training, [phone]- Electrical stimulation (manual), Patient/Family education, Balance training, Stair training, Joint mobilization, Joint manipulation, DME instructions, Cryotherapy, and Moist heat  PLAN FOR NEXT SESSION:   Continue with dynamic progress balance activities Continue with Dual task activities with balance Continue with progressive LE strengthening Add to HEP as appropriate.   Reyes LOISE London, PT 01/16/2024, 11:28 AM  11:28 AM, 01/16/24 Chyrl London, PT Physical Therapist - [facility]  Outpatient Physical Therapy- Main Campus [phone]

## 2024-01-21 ENCOUNTER — Ambulatory Visit

## 2024-01-21 DIAGNOSIS — M6281 Muscle weakness (generalized): Secondary | ICD-10-CM

## 2024-01-21 DIAGNOSIS — R2681 Unsteadiness on feet: Secondary | ICD-10-CM

## 2024-01-21 DIAGNOSIS — R296 Repeated falls: Secondary | ICD-10-CM | POA: Diagnosis not present

## 2024-01-21 NOTE — Therapy (Signed)
 OUTPATIENT PHYSICAL THERAPY TREATMENT  Patient Name: Sonya Park MRN: [id number] DOB:06-03-1940, 83 y.o., female Today's Date: 01/21/2024  PCP: Liana Fish, NP REFERRING PROVIDER: Maree Jannett POUR, MD  END OF SESSION:  PT End of Session - 01/21/24 1149     Visit Number 8    Number of Visits 16    Date for Recertification  02/05/24    Authorization Type [employer] Medicare    Authorization Time Period 12/11/23-02/05/24    Progress Note Due on Visit 10    PT Start Time 1145    PT Stop Time 1225    PT Time Calculation (min) 40 min    Equipment Utilized During Treatment Gait belt    Activity Tolerance Patient tolerated treatment well;No increased pain;Patient limited by fatigue    Behavior During Therapy [facility] for tasks assessed/performed           Past Medical History:  Diagnosis Date   Arthritis    Back pain    Insomnia    Medical history non-contributory    Osteoporosis    Past Surgical History:  Procedure Laterality Date   ABDOMINAL HYSTERECTOMY     BREAST LUMPECTOMY Right    COLONOSCOPY WITH PROPOFOL  N/A 07/18/2015   Procedure: COLONOSCOPY WITH PROPOFOL ;  Surgeon: Rogelia Copping, MD;  Location: [facility] ENDOSCOPY;  Service: Endoscopy;  Laterality: N/A;   COSMETIC SURGERY     GASTRIC BYPASS     X2   HEMORROIDECTOMY     KNEE ARTHROPLASTY Right 12/13/2015   Procedure: COMPUTER ASSISTED TOTAL KNEE ARTHROPLASTY;  Surgeon: Lynwood SHAUNNA Hue, MD;  Location: [facility] ORS;  Service: Orthopedics;  Laterality: Right;   KNEE ARTHROSCOPY     REPLACEMENT TOTAL KNEE Left    SHOULDER SURGERY Right    Patient Active Problem List   Diagnosis Date Noted   Tremor of right hand 11/24/2023   Sacral back pain 11/24/2023   Closed fracture of left inferior pubic ramus (HCC) 06/25/2023   Closed fracture of left distal radius 06/14/2023   Closed pelvic fracture (HCC) 06/13/2023   HTN (hypertension) 06/13/2023   Chronic diastolic CHF (congestive heart failure) (HCC) 06/13/2023   Wrist fracture,  closed, left, initial encounter 06/13/2023   Vitamin D  deficiency 04/12/2023   Hypocalcemia 04/12/2023   Iron deficiency anemia 04/16/2022   Closed displaced intertrochanteric fracture of right femur (HCC) 03/05/2022   Closed fracture of proximal end of right humerus 03/05/2022   Fracture of right hip (HCC) 02/03/2022   Shoulder fracture, right, closed, initial encounter 02/03/2022   Fall at home, initial encounter 02/03/2022   Neck pain 10/24/2021   Irritable bowel syndrome 10/14/2021   Obstructive sleep apnea of adult 10/14/2021   Vulvovaginitis 07/27/2021   Hypoglycemia 04/11/2021   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 01/16/2021   Vertigo 06/09/2020   Rotator cuff arthropathy of right shoulder 03/22/2020   Hyperlipidemia 02/21/2020   Need for COVID-19 vaccine 02/14/2020   Essential hypertension 09/02/2019   Seasonal allergic rhinitis due to pollen 09/02/2019   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) 11/17/2017   Lymphedema 08/11/2017   Anemia, macrocytic, nutritional    Macrocytic anemia    Dysphagia    Itching    Screening for colon cancer    LFT elevation    Severe malnutrition    Adult failure to thrive    Hyperbilirubinemia    Alcoholic liver failure (HCC)    Thrombocytopenia  Pressure injury of skin 01/19/2017   Bacteremia    Palliative care by specialist    Goals of care, counseling/discussion    Alcohol abuse 01/15/2017   Facial trauma 01/14/2017   Osteoporosis 01/14/2017   Neutropenia 01/14/2017   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   Special screening for malignant neoplasms, colon    Benign neoplasm of transverse colon    Fracture of right inferior pubic ramus (HCC) 09/17/2014   Compression fracture of L3 vertebra (HCC) 09/17/2014   Closed wedge compression fracture of third lumbar vertebra (HCC) 09/17/2014   Other specified fracture of  right pubis, initial encounter for closed fracture (HCC) 09/17/2014   ONSET DATE: chronic REFERRING DIAG: falls THERAPY DIAG:  Repeated falls  Unsteadiness on feet  Muscle weakness (generalized)  Rationale for Evaluation and Treatment: Rehabilitation  SUBJECTIVE:                                                                                                                                                                                             SUBJECTIVE STATEMENT: No updates today from patient. Pt doing well.  Got her DEXA results back which say increased fracture risk.   PERTINENT HISTORY:   83yoF who is referred to OPPT for imbalance and multiple falls since 2018. Most recently sustained a pelvis fracture in 2025 s/p fall and Rt hip fracture s/p fall 2024. 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 Pt is currently back to her baseline which includes ad lib AMB without device, however she has not tolerated community distances in years due to chronic low back pain, is more recently limited by SOB and chest tightness with walking longer distances. Pt is fairly active at home, plans to leave here after evaluation visit and help her husband finish putting out more mulch at the house.   PAIN:  Are you having pain? no pain at rest.    PRECAUTIONS: falls  WEIGHT BEARING RESTRICTIONS: None  FALLS: Has patient fallen in last 6 months? Yes   LIVING ENVIRONMENT: Lives with: husband  Lives in: 2 level house, lives on main level  Stairs: 2 separate steps Has following equipment at home: WC, 5 canes, walker  PLOF: has recently rehabbed back to walking without a cane  PATIENT GOALS: stop falling   OBJECTIVE:  TREATMENT DATE 01/21/24 : -STS x15 from chair, hands free -marching in place x40, cues for higher knees -STS from chair x8 c 5kg ball  -soccer ball dribble off kne  1x15 bilat  -STS from chair x8 c 5kg ball  -side stepping in // bars 4 laps each way with BTB, 2 hand support  -Nerf ball drop kicks 6x each way, 2-3 LOB, good kcik distance each x ~21ft  -alternate forward/backward AMB 3x30ft: 2lb AW and 5kg ball carry minGuard assist -airex pad stance with overhead ball rebounding x15   PATIENT EDUCATION: Education details: repeat explanation of [employer] parking and volunteer services offering; explained how Rt hip instability is causing Left toe catching issues;  Person educated: Patient Education method: discussion  Education comprehension: Fair (some memory retention difficulty)   HOME EXERCISE PROGRAM:   GOALS: Goals reviewed with patient? Yes   SHORT TERM GOALS: Target date: 01/10/24  Pt to demonstrate improved cervical rotation ROM right side >40 degrees to improve safety with scanning driving and reduce need for full body rotation during household activity.  Baseline: eval: 30 degrees Rt rotation.  Goal status: INITIAL  2.  Pt to demonstrate 5xSTS in <11.5sec hands free and without LOB to demonstrate improve BLE power.  Baseline:  Goal status: INITIAL  3.  Pt will demonstrate ability to hold tandem stance balance > 20sec bilat to demonstrate improved hip strength/proprioception for balance.  Baseline:  Goal status: INITIAL  LONG TERM GOALS: Target date: 02/10/24  Pt to improve Berg Balance Test score to >52 to indicate reduced risk of falls.  Baseline: 45 Goal status: INITIAL  2.  Pt to demonstrate 5x STS <10.4sec hands free and without LOB to show improved power in BLE.  Baseline:  Goal status: PROGRESSING  3.  Pt to demonstrate ability to perform >60sec tandem stance bilat.  Baseline: <5secH  Goal status: INITIAL  4.  Pt to demonstrate improved Right cervical rotation ROM >46 degrees to improve safety with visual scanning in car and while working in yard.  Baseline: 30 degrees Goal status: INITIAL  ASSESSMENT:  CLINICAL  IMPRESSION: Continued with current plan of progressing balance as appropriate. Pt able to take on greater challenges overall with intermittent LOB. Patient will benefit from skilled physical therapy intervention to reduce deficits and impairments identified in evaluation, in order to reduce pain, improve quality of life, and maximize activity tolerance for ADL, IADL, and leisure/fitness. Physical therapy will help pt achieve long and short term goals of care.   OBJECTIVE IMPAIRMENTS: Decreased knowledge of condition, decreased use of DME, decreased mobility, difficulty walking, decreased strength, decreased ROM. ACTIVITY LIMITATIONS: Lifting, standing, walking, squatting, transfers, locomotion level PARTICIPATION LIMITATIONS: Cleaning, laundry, interpersonal relationships, driving, yardwork, community activity.  PERSONAL FACTORS: Age, behavior pattern, education, past/current experiences, transportation, profession  are also affecting patient's functional outcome.  REHAB POTENTIAL: Good CLINICAL DECISION MAKING: Medium  EVALUATION COMPLEXITY: Moderate   PLAN:  PT FREQUENCY: 1-2x/week  PT DURATION: 8 weeks  PLANNED INTERVENTIONS: 97110-Therapeutic exercises, 97530- Therapeutic activity, [id number]- Neuromuscular re-education, 97535- Self Care, 02859- Manual therapy, [phone]- Gait training, [phone]- Electrical stimulation (manual), Patient/Family education, Balance training, Stair training, Joint mobilization, Joint manipulation, DME instructions, Cryotherapy, and Moist heat  PLAN FOR NEXT SESSION:  Continue with dynamic progress balance activities Continue with Dual task activities with balance Continue with progressive LE strengthening Add to HEP as appropriate.   Raiyah Speakman C, PT 01/21/2024, 11:51 AM  11:51 AM, 01/21/24 Peggye JAYSON Linear, PT, DPT Physical Therapist - [facility]  Outpatient Physical Therapy- Main Campus [phone]

## 2024-01-22 ENCOUNTER — Ambulatory Visit: Admitting: Internal Medicine

## 2024-01-22 ENCOUNTER — Telehealth: Payer: Self-pay | Admitting: Internal Medicine

## 2024-01-22 ENCOUNTER — Encounter: Payer: Self-pay | Admitting: Internal Medicine

## 2024-01-22 VITALS — BP 130/68 | HR 77 | Temp 97.4°F | Resp 16 | Ht 66.0 in | Wt 138.0 lb

## 2024-01-22 DIAGNOSIS — R1013 Epigastric pain: Secondary | ICD-10-CM | POA: Diagnosis not present

## 2024-01-22 DIAGNOSIS — K911 Postgastric surgery syndromes: Secondary | ICD-10-CM | POA: Diagnosis not present

## 2024-01-22 DIAGNOSIS — E678 Other specified hyperalimentation: Secondary | ICD-10-CM

## 2024-01-22 DIAGNOSIS — R197 Diarrhea, unspecified: Secondary | ICD-10-CM | POA: Diagnosis not present

## 2024-01-22 DIAGNOSIS — M810 Age-related osteoporosis without current pathological fracture: Secondary | ICD-10-CM | POA: Diagnosis not present

## 2024-01-22 NOTE — Telephone Encounter (Signed)
Notified patient of [facility] appointment date, arrival time, location and npo after midnight-Toni 

## 2024-01-22 NOTE — Progress Notes (Signed)
 [facility] [address] 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    aspirin  81 MG tablet Take 81 mg by mouth daily. Once a week (every Sunday)    Biotin (BIOTIN EXTRA STRENGTH) 10 MG CAPS Take by mouth.    calcium  citrate-vitamin D  (CITRACAL+D) 315-200 MG-UNIT tablet Take by mouth.    celecoxib (CELEBREX) 200 MG capsule Take 200 mg by mouth 2 (two) times daily.    Copper  Gluconate 2 MG CAPS Take 4 mg by mouth daily.    Cyanocobalamin  (B-12) 3000 MCG CAPS Take by mouth.    ferrous sulfate  (FEROSUL) 325 (65 FE) MG tablet Take 1 tablet (325 mg total) by mouth daily.    Melatonin 5 MG TABS Take 2.5 mg by mouth. As needed 06/16/2023: prn   Multiple Vitamins-Minerals (BARIATRIC MULTIVITAMINS/IRON PO) Take 2 capsules by mouth every morning. After breakfast.    oxyCODONE  (OXY IR/ROXICODONE ) 5 MG immediate release tablet Take 1 tablet (5 mg total) by mouth every 4 (four) hours as needed for severe pain (pain score 7-10).    pantoprazole  (PROTONIX ) 40 MG tablet Take 1 tablet (40 mg total) by mouth 2 (two) times daily before a meal.    PREVIDENT 5000 SENSITIVE 1.1-5 % GEL Place 1 Application onto teeth as directed. 06/16/2023: prn   PROLIA 60 MG/ML SOSY injection Inject 60 mg into the  skin every 6 (six) months. 06/16/2023: Patient states it's due now   spironolactone  (ALDACTONE ) 25 MG tablet Take 1 tablet (25 mg total) by mouth daily.    thiamine  100 MG tablet Take 1 tablet (100 mg total) daily by mouth.    No facility-administered encounter medications on file as of 01/22/2024.    Surgical History: Past Surgical History:  Procedure Laterality Date   ABDOMINAL HYSTERECTOMY     BREAST LUMPECTOMY Right    COLONOSCOPY WITH PROPOFOL  N/A 07/18/2015   Procedure: COLONOSCOPY WITH PROPOFOL ;  Surgeon: Rogelia Copping, MD;  Location: [facility] END[facility]COPY;  Service: Endoscopy;  Laterality: N/A;   COSMETIC SURGERY     GASTRIC BYPASS     X2   HEMORROIDECTOMY     KNEE ARTHROPLASTY Right 12/13/2015   Procedure: COMPUTER ASSISTED TOTAL KNEE ARTHROPLASTY;  Surgeon: Lynwood SHAUNNA Hue, MD;  Location: [facility] ORS;  Service: Orthopedics;  Laterality: Right;   KNEE ARTHROSCOPY     REPLACEMENT TOTAL KNEE Left    SHOULDER SURGERY Right     Medical History: Past Medical History:  Diagnosis Date   Arthritis    Back pain    Insomnia    Medical history non-contributory    Osteoporosis     Family History: Family History  Problem Relation Age of Onset   Liver disease Mother    Heart attack Father    Cancer  Father    CAD Sister     Social History   Socioeconomic History   Marital status: Married    Spouse name: Bridgitt Raggio   Number of children: 1   Years of education: 12   Highest education level: Some college, no degree  Occupational History   Occupation: Retired  Tobacco Use   Smoking status: Former    Current packs/day: 0.00    Average packs/day: 2.0 packs/day for 29.0 years (58.0 ttl pk-yrs)    Types: Cigarettes    Start date: 11/29/1965    Quit date: 11/30/1994    Years since quitting: 29.1   Smokeless tobacco: Never  Vaping Use   Vaping status: Never Used  Substance and Sexual Activity   Alcohol use: Not Currently    Alcohol/week: 21.0 - 35.0 standard drinks of alcohol     Types: 21 - 35 Glasses of wine per week    Comment: 3-5 glasses of wine/ day   Drug use: No   Sexual activity: Not Currently  Other Topics Concern   Not on file  Social History Narrative   Not on file   Social Drivers of Health   Financial Resource Strain: Low Risk  (10/14/2023)   Received from [facility]   Overall Financial Resource Strain (CARDIA)    Difficulty of Paying Living Expenses: Not hard at all  Food Insecurity: No Food Insecurity (10/14/2023)   Received from [facility]   Hunger Vital Sign    Within the past 12 months, you worried that your food would run out before you got the money to buy more.: Never true    Within the past 12 months, the food you bought just didn't last and you didn't have money to get more.: Never true  Transportation Needs: No Transportation Needs (10/14/2023)   Received from [facility] - Transportation    In the past 12 months, has lack of transportation kept you from medical appointments or from getting medications?: No    Lack of Transportation (Non-Medical): No  Physical Activity: Inactive (03/14/2022)   Exercise Vital Sign    Days of Exercise per Week: 0 days    Minutes of Exercise per Session: 0 min  Stress: Stress Concern Present (03/14/2022)   [employer] of Occupational [employer] - Occupational Stress Questionnaire    Feeling of Stress : To some extent  Social Connections: Moderately Isolated (06/13/2023)   Social Connection and Isolation Panel    Frequency of Communication with Friends and Family: More than three times a week    Frequency of Social Gatherings with Friends and Family: Once a week    Attends Religious Services: Never    Database administrator or Organizations: No    Attends Banker Meetings: Never    Marital Status: Married  Catering manager Violence: Not At Risk (06/13/2023)   Humiliation, Afraid, Rape, and Kick questionnaire    Fear of  Current or Ex-Partner: No    Emotionally Abused: No    Physically Abused: No    Sexually Abused: No      Review of Systems  Constitutional:  Negative for fatigue and fever.  HENT:  Negative for congestion, mouth sores and postnasal drip.   Respiratory:  Negative for cough.   Cardiovascular:  Negative for chest pain.  Gastrointestinal:  Positive for abdominal distention and diarrhea.  Genitourinary:  Negative for flank pain.  Psychiatric/Behavioral: Negative.      Vital  Signs: BP 130/68   Pulse 77   Temp (!) 97.4 F (36.3 C)   Resp 16   Ht 5' 6 (1.676 m)   Wt 138 lb (62.6 kg)   SpO2 97%   BMI 22.27 kg/m    Physical Exam Constitutional:      Appearance: Normal appearance.  HENT:     Head: Normocephalic and atraumatic.     Nose: Nose normal.     Mouth/Throat:     Mouth: Mucous membranes are moist.     Pharynx: No posterior oropharyngeal erythema.  Eyes:     Extraocular Movements: Extraocular movements intact.     Pupils: Pupils are equal, round, and reactive to light.  Cardiovascular:     Pulses: Normal pulses.     Heart sounds: Normal heart sounds.  Pulmonary:     Effort: Pulmonary effort is normal.     Breath sounds: Normal breath sounds.  Neurological:     General: No focal deficit present.     Mental Status: She is alert.  Psychiatric:        Mood and Affect: Mood normal.        Behavior: Behavior normal.        Assessment/Plan: 1. Epigastric pain (Primary) Pt might have gastritis, will continue protonix , might need UGI - US  Abdomen Complete; Future  2. Diarrhea, unspecified type S/p Gastric bypass, will need to look into Cholelithiasis  - US  Abdomen Complete; Future  3. Osteoporosis without current pathological fracture, unspecified osteoporosis type Continue IV reclast   4. Late dumping syndrome Pt has s/s of reactive hypoglycemia S/P gastric bypass in 2009, encouraged high protein diet and frequent meals   5. Hypervitaminosis Pt is  instructed to decrease the frequency of her vitamins as it can alo lead to neuropathy    General Counseling: Theodosia verbalizes understanding of the findings of todays visit and agrees with plan of treatment. I have discussed any further diagnostic evaluation that may be needed or ordered today. We also reviewed her medications today. she has been encouraged to call the office with any questions or concerns that should arise related to todays visit.    Orders Placed This Encounter  Procedures   US  Abdomen Complete    No orders of the defined types were placed in this encounter.   Total time spent:30 Minutes Time spent includes review of chart, medications, test results, and follow up plan with the patient.   [place] Controlled Substance Database was reviewed by me.   Dr Malashia Kamaka M Jahme[facility] Internal medicine

## 2024-01-23 ENCOUNTER — Ambulatory Visit: Admitting: Nurse Practitioner

## 2024-01-23 ENCOUNTER — Ambulatory Visit: Admitting: Physical Therapy

## 2024-01-23 DIAGNOSIS — M6281 Muscle weakness (generalized): Secondary | ICD-10-CM

## 2024-01-23 DIAGNOSIS — R296 Repeated falls: Secondary | ICD-10-CM | POA: Diagnosis not present

## 2024-01-23 DIAGNOSIS — R2681 Unsteadiness on feet: Secondary | ICD-10-CM

## 2024-01-23 NOTE — Therapy (Signed)
 OUTPATIENT PHYSICAL THERAPY TREATMENT  Patient Name: Sonya Park MRN: [id number] DOB:November 18, 1940, 83 y.o., female Today's Date: 01/23/2024  PCP: Liana Fish, NP REFERRING PROVIDER: Maree Jannett POUR, MD  END OF SESSION:  PT End of Session - 01/23/24 1001     Visit Number 9    Number of Visits 16    Date for Recertification  02/05/24    Authorization Type [employer] Medicare    Authorization Time Period 12/11/23-02/05/24    Progress Note Due on Visit 10    PT Start Time 0933    PT Stop Time 1013    PT Time Calculation (min) 40 min    Equipment Utilized During Treatment Gait belt    Activity Tolerance Patient tolerated treatment well;No increased pain;Patient limited by fatigue    Behavior During Therapy [facility] for tasks assessed/performed            Past Medical History:  Diagnosis Date   Arthritis    Back pain    Insomnia    Medical history non-contributory    Osteoporosis    Past Surgical History:  Procedure Laterality Date   ABDOMINAL HYSTERECTOMY     BREAST LUMPECTOMY Right    COLONOSCOPY WITH PROPOFOL  N/A 07/18/2015   Procedure: COLONOSCOPY WITH PROPOFOL ;  Surgeon: Rogelia Copping, MD;  Location: [facility] END[facility];  Service: Endoscopy;  Laterality: N/A;   COSMETIC SURGERY     GASTRIC BYPASS     X2   HEMORROIDECTOMY     KNEE ARTHROPLASTY Right 12/13/2015   Procedure: COMPUTER ASSISTED TOTAL KNEE ARTHROPLASTY;  Surgeon: Lynwood SHAUNNA Hue, MD;  Location: [facility];  Service: Orthopedics;  Laterality: Right;   KNEE ARTHROSCOPY     REPLACEMENT TOTAL KNEE Left    SHOULDER SURGERY Right    Patient Active Problem List   Diagnosis Date Noted   Tremor of right hand 11/24/2023   Sacral back pain 11/24/2023   Closed fracture of left inferior pubic ramus (HCC) 06/25/2023   Closed fracture of left distal radius 06/14/2023   Closed pelvic fracture (HCC) 06/13/2023   HTN (hypertension) 06/13/2023   Chronic diastolic CHF (congestive heart failure) (HCC) 06/13/2023   Wrist fracture,  closed, left, initial encounter 06/13/2023   Vitamin D  deficiency 04/12/2023   Hypocalcemia 04/12/2023   Iron deficiency anemia 04/16/2022   Closed displaced intertrochanteric fracture of right femur (HCC) 03/05/2022   Closed fracture of proximal end of right humerus 03/05/2022   Fracture of right hip (HCC) 02/03/2022   Shoulder fracture, right, closed, initial encounter 02/03/2022   Fall at home, initial encounter 02/03/2022   Neck pain 10/24/2021   Irritable bowel syndrome 10/14/2021   Obstructive sleep apnea of adult 10/14/2021   Vulvovaginitis 07/27/2021   Hypoglycemia 04/11/2021   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 01/16/2021   Vertigo 06/09/2020   Rotator cuff arthropathy of right shoulder 03/22/2020   Hyperlipidemia 02/21/2020   Need for COVID-19 vaccine 02/14/2020   Essential hypertension 09/02/2019   Seasonal allergic rhinitis due to pollen 09/02/2019   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) 11/17/2017   Lymphedema 08/11/2017   Anemia, macrocytic, nutritional    Macrocytic anemia    Dysphagia    Itching    Screening for colon cancer    LFT elevation    Severe malnutrition    Adult failure to thrive    Hyperbilirubinemia    Alcoholic liver failure (HCC)    Thrombocytopenia  Pressure injury of skin 01/19/2017   Bacteremia    Palliative care by specialist    Goals of care, counseling/discussion    Alcohol abuse 01/15/2017   Facial trauma 01/14/2017   Osteoporosis 01/14/2017   Neutropenia 01/14/2017   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  Rationale for Evaluation and Treatment: Rehabilitation  SUBJECTIVE:                                                                                                                                                                                             SUBJECTIVE STATEMENT: No updates today from patient. Pt doing well.  Pt had some tests done and reports she has osteoporosis. Has further imaging on abdomen to see if she has gall stones.   PERTINENT HISTORY:   83yoF who is referred to OPPT for imbalance and multiple falls since 2018. Most recently sustained a pelvis fracture in 2025 s/p fall and Rt hip fracture s/p fall 2024. 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 Pt is currently back to her baseline which includes ad lib AMB without device, however she has not tolerated community distances in years due to chronic low back pain, is more recently limited by SOB and chest tightness with walking longer distances. Pt is fairly active at home, plans to leave here after evaluation visit and help her husband finish putting out more mulch at the house.   PAIN:  Are you having pain? no pain at rest.    PRECAUTIONS: falls  WEIGHT BEARING RESTRICTIONS: None  FALLS: Has patient fallen in last 6 months? Yes   LIVING ENVIRONMENT: Lives with: husband  Lives in: 2 level house, lives on main level  Stairs: 2 separate steps Has following equipment at home: WC, 5 canes, walker  PLOF: has recently rehabbed back to walking without a cane  PATIENT GOALS: stop falling   OBJECTIVE:  TREATMENT DATE 01/23/24 : -STS x15 from chair, hands free -marching in place x40, cues for higher knees -STS with ball toss and catch then repeat x 10  -Nerf  ball basketball shot while standing on airex pad and squatting to ground to pick up and shoot each ball 2 x 10 reps standing on airex for rest between sets  -STS from chair on airex pad x 10 reps with close CGA -side stepping along support bar  laps each way with BTB around knees, 2 hand support   - pt reports some eye focus symptoms so BP assessed: 131/73 HR 73  -Side step up to step trainer no UE supprt 2 x 10 ea LE   -alternate forward/side/backward/side AMB 3x39ft: 2lb AW    PATIENT EDUCATION: Education details: repeat explanation of vallet parking and volunteer services offering; explained how Rt hip instability is causing Left toe catching issues;  Person educated: Patient Education method: discussion  Education comprehension: Fair (some memory retention difficulty)   HOME EXERCISE PROGRAM:   GOALS: Goals reviewed with patient? Yes   SHORT TERM GOALS: Target date: 01/10/24  Pt to demonstrate improved cervical rotation ROM right side >40 degrees to improve safety with scanning driving and reduce need for full body rotation during household activity.  Baseline: eval: 30 degrees Rt rotation.  Goal status: INITIAL  2.  Pt to demonstrate 5xSTS in <11.5sec hands free and without LOB to demonstrate improve BLE power.  Baseline:  Goal status: INITIAL  3.  Pt will demonstrate ability to hold tandem stance balance > 20sec bilat to demonstrate improved hip strength/proprioception for balance.  Baseline:  Goal status: INITIAL  LONG TERM GOALS: Target date: 02/10/24  Pt to improve Berg Balance Test score to >52 to indicate reduced risk of falls.  Baseline: 45 Goal status: INITIAL  2.  Pt to demonstrate 5x STS <10.4sec hands free and without LOB to show improved power in BLE.  Baseline:  Goal status: PROGRESSING  3.  Pt to demonstrate ability to perform >60sec tandem stance bilat.  Baseline: <5secH  Goal status: INITIAL  4.  Pt to demonstrate improved Right cervical rotation ROM  >46 degrees to improve safety with visual scanning in car and while working in yard.  Baseline: 30 degrees Goal status: INITIAL  ASSESSMENT:  CLINICAL IMPRESSION: Continued with current plan of care as laid out in evaluation and recent prior sessions. Pt remains motivated to advance progress toward goals in order to maximize independence and safety at home. Pt requires high level assistance and cuing for completion of exercises in order to provide adequate level of stimulation and perturbation. Author allows pt as much opportunity as possible to perform independent righting strategies, only stepping in when pt is unable to prevent falling to floor. Pt closely monitored throughout session for safe vitals response and to maximize patient safety during interventions. Pt continues to demonstrate progress toward goals AEB progression of some interventions this date either in volume or intensity. Patient will benefit from skilled physical therapy intervention to reduce deficits and impairments identified in evaluation, in order to reduce pain, improve quality of life, and maximize activity tolerance for ADL, IADL, and leisure/fitness. Physical therapy will help pt achieve long and short term goals of care.   OBJECTIVE IMPAIRMENTS: Decreased knowledge of condition, decreased use of DME, decreased mobility, difficulty walking, decreased strength, decreased ROM. ACTIVITY LIMITATIONS: Lifting, standing, walking, squatting, transfers, locomotion level PARTICIPATION LIMITATIONS: Cleaning, laundry, interpersonal relationships, driving, yardwork, community activity.  PERSONAL FACTORS: Age,  behavior pattern, education, past/current experiences, transportation, profession  are also affecting patient's functional outcome.  REHAB POTENTIAL: Good CLINICAL DECISION MAKING: Medium  EVALUATION COMPLEXITY: Moderate   PLAN:  PT FREQUENCY: 1-2x/week  PT DURATION: 8 weeks  PLANNED INTERVENTIONS: 97110-Therapeutic  exercises, 97530- Therapeutic activity, [id number]- Neuromuscular re-education, 97535- Self Care, [id number]- Manual therapy, [phone]- Gait training, [phone]- Electrical stimulation (manual), Patient/Family education, Balance training, Stair training, Joint mobilization, Joint manipulation, DME instructions, Cryotherapy, and Moist heat  PLAN FOR NEXT SESSION:  Continue with dynamic progress balance activities Continue with Dual task activities with balance Continue with progressive LE strengthening Add to HEP as appropriate.   Lonni KATHEE Gainer, PT 01/23/2024, 10:22 AM  10:22 AM, 01/23/24

## 2024-01-27 ENCOUNTER — Ambulatory Visit

## 2024-01-27 DIAGNOSIS — R296 Repeated falls: Secondary | ICD-10-CM | POA: Diagnosis not present

## 2024-01-27 DIAGNOSIS — R2681 Unsteadiness on feet: Secondary | ICD-10-CM

## 2024-01-27 DIAGNOSIS — M6281 Muscle weakness (generalized): Secondary | ICD-10-CM

## 2024-01-27 NOTE — Therapy (Signed)
 OUTPATIENT PHYSICAL THERAPY TREATMENT Physical Therapy Progress Note  Dates of reporting period  12/13/23   to   01/27/24  Patient Name: Sonya Park MRN: [id number] DOB:1940/08/15, 83 y.o., female Today's Date: 01/27/2024  PCP: Liana Fish, NP REFERRING PROVIDER: Maree Jannett POUR, MD  END OF SESSION:  PT End of Session - 01/27/24 1106     Visit Number 10    Number of Visits 16    Date for Recertification  02/05/24    Authorization Type [employer] Medicare    Authorization Time Period 12/11/23-02/05/24    Progress Note Due on Visit 10    PT Start Time 1102    PT Stop Time 1142    PT Time Calculation (min) 40 min    Equipment Utilized During Treatment Gait belt    Activity Tolerance Patient tolerated treatment well;No increased pain;Patient limited by fatigue    Behavior During Therapy [facility] for tasks assessed/performed          Past Medical History:  Diagnosis Date   Arthritis    Back pain    Insomnia    Medical history non-contributory    Osteoporosis    Past Surgical History:  Procedure Laterality Date   ABDOMINAL HYSTERECTOMY     BREAST LUMPECTOMY Right    COLONOSCOPY WITH PROPOFOL  N/A 07/18/2015   Procedure: COLONOSCOPY WITH PROPOFOL ;  Surgeon: Rogelia Copping, MD;  Location: [facility] END[facility];  Service: Endoscopy;  Laterality: N/A;   COSMETIC SURGERY     GASTRIC BYPASS     X2   HEMORROIDECTOMY     KNEE ARTHROPLASTY Right 12/13/2015   Procedure: COMPUTER ASSISTED TOTAL KNEE ARTHROPLASTY;  Surgeon: Lynwood SHAUNNA Hue, MD;  Location: [facility] ORS;  Service: Orthopedics;  Laterality: Right;   KNEE ARTHROSCOPY     REPLACEMENT TOTAL KNEE Left    SHOULDER SURGERY Right    Patient Active Problem List   Diagnosis Date Noted   Tremor of right hand 11/24/2023   Sacral back pain 11/24/2023   Closed fracture of left inferior pubic ramus (HCC) 06/25/2023   Closed fracture of left distal radius 06/14/2023   Closed pelvic fracture (HCC) 06/13/2023   HTN (hypertension) 06/13/2023    Chronic diastolic CHF (congestive heart failure) (HCC) 06/13/2023   Wrist fracture, closed, left, initial encounter 06/13/2023   Vitamin D  deficiency 04/12/2023   Hypocalcemia 04/12/2023   Iron deficiency anemia 04/16/2022   Closed displaced intertrochanteric fracture of right femur (HCC) 03/05/2022   Closed fracture of proximal end of right humerus 03/05/2022   Fracture of right hip (HCC) 02/03/2022   Shoulder fracture, right, closed, initial encounter 02/03/2022   Fall at home, initial encounter 02/03/2022   Neck pain 10/24/2021   Irritable bowel syndrome 10/14/2021   Obstructive sleep apnea of adult 10/14/2021   Vulvovaginitis 07/27/2021   Hypoglycemia 04/11/2021   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 01/16/2021   Vertigo 06/09/2020   Rotator cuff arthropathy of right shoulder 03/22/2020   Hyperlipidemia 02/21/2020   Need for COVID-19 vaccine 02/14/2020   Essential hypertension 09/02/2019   Seasonal allergic rhinitis due to pollen 09/02/2019   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) 11/17/2017   Lymphedema 08/11/2017   Anemia, macrocytic, nutritional    Macrocytic anemia    Dysphagia    Itching    Screening for colon cancer    LFT elevation    Severe malnutrition    Adult failure to thrive  Hyperbilirubinemia    Alcoholic liver failure (HCC)    Thrombocytopenia    Pressure injury of skin 01/19/2017   Bacteremia    Palliative care by specialist    Goals of care, counseling/discussion    Alcohol abuse 01/15/2017   Facial trauma 01/14/2017   Osteoporosis 01/14/2017   Neutropenia 01/14/2017   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   Special screening for malignant neoplasms, colon    Benign neoplasm of transverse colon    Fracture of right inferior pubic ramus (HCC) 09/17/2014   Compression fracture of L3 vertebra (HCC) 09/17/2014   Closed wedge  compression fracture of third lumbar vertebra (HCC) 09/17/2014   Other specified fracture of right pubis, initial encounter for closed fracture (HCC) 09/17/2014   ONSET DATE: chronic REFERRING DIAG: falls THERAPY DIAG:  Repeated falls  Unsteadiness on feet  Muscle weakness (generalized)  Rationale for Evaluation and Treatment: Rehabilitation  SUBJECTIVE:                                                                                                                                                                                             SUBJECTIVE STATEMENT: No updates today from patient. Pt doing well.   PERTINENT HISTORY:   83yoF who is referred to OPPT for imbalance and multiple falls since 2018. Most recently sustained a pelvis fracture in 2025 s/p fall and Rt hip fracture s/p fall 2024. 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 Pt is currently back to her baseline which includes ad lib AMB without device, however she has not tolerated community distances in years due to chronic low back pain, is more recently limited by SOB and chest tightness with walking longer distances. Pt is fairly active at home, plans to leave here after evaluation visit and help her husband finish putting out more mulch at the house.   PAIN:  Are you having pain? no pain at rest.    PRECAUTIONS: falls  WEIGHT BEARING RESTRICTIONS: None  FALLS: Has patient fallen in last 6 months? Yes   LIVING ENVIRONMENT: Lives with: husband  Lives in: 2 level house, lives on main level  Stairs: 2 separate steps Has following equipment at home: WC, 5 canes, walker  PLOF: has recently rehabbed back to walking without a cane  PATIENT GOALS: stop falling   OBJECTIVE:  TREATMENT DATE 01/27/24 :  -01/27/24: 11.16sec; 9.70sec - tandem stance practice on x2 minutes (split each way)  - single leg balance  1x60sec in // bars (uses hands often)  -aiex pad stance with pickle ball paddle dribble x2 minutes  -cable resisted walking backward and forward: struggles with left concentric and Rigth eccentric performance (Right hip)      PATIENT EDUCATION: Education details: repeat explanation of [employer] parking and volunteer services offering; explained how Rt hip instability is causing Left toe catching issues;  Person educated: Patient Education method: discussion  Education comprehension: Fair (some memory retention difficulty)   HOME EXERCISE PROGRAM:   GOALS: Goals reviewed with patient? Yes   SHORT TERM GOALS: Target date: 01/10/24  Pt to demonstrate improved cervical rotation ROM right side >40 degrees to improve safety with scanning driving and reduce need for full body rotation during household activity.  Baseline: eval: 30 degrees Rt rotation.  Goal status: INITIAL  2.  Pt to demonstrate 5xSTS in <11.5sec hands free and without LOB to demonstrate improve BLE power.  Baseline: 01/27/24: 11.16sec; 9.70sec Goal status: MET  3.  Pt will demonstrate ability to hold tandem stance balance > 20sec bilat to demonstrate improved hip strength/proprioception for balance.  Baseline: 01/27/24: ~5sec each way Goal status: INITIAL  LONG TERM GOALS: Target date: 02/10/24  Pt to improve Berg Balance Test score to >52 to indicate reduced risk of falls.  Baseline: 45 Goal status: INITIAL  2.  Pt to demonstrate 5x STS <10.4sec hands free and without LOB to show improved power in BLE.  Baseline: 01/27/24: 11.16sec; 9.70sec Goal status: MET  3.  Pt to demonstrate ability to perform >60sec tandem stance bilat.  Baseline: <5secH 01/27/24: ~5secH  Goal status: INITIAL  4.  Pt to demonstrate improved Right cervical rotation ROM >46 degrees to improve safety with visual scanning in car and while working in yard.  Baseline: 30 degrees Goal status: INITIAL  ASSESSMENT:  CLINICAL  IMPRESSION: Reassessment today pt making good progress toward goals overall. Pt still has a lot to work on to achieve LT goals of care. Continued with current plan of care as laid out in evaluation and recent prior sessions. Pt remains motivated to advance progress toward goals in order to maximize independence and safety at home. Pt requires low to moderate level assistance and cuing for completion of exercises in order to provide adequate level of stimulation and perturbation. Author allows pt as much opportunity as possible to perform independent righting strategies, only stepping in when pt is unable to prevent falling to floor. Pt continues to demonstrate progress toward goals AEB progression of some interventions this date either in volume or intensity. Patient will benefit from skilled physical therapy intervention to reduce deficits and impairments identified in evaluation, in order to reduce pain, improve quality of life, and maximize activity tolerance for ADL, IADL, and leisure/fitness. Physical therapy will help pt achieve long and short term goals of care.   OBJECTIVE IMPAIRMENTS: Decreased knowledge of condition, decreased use of DME, decreased mobility, difficulty walking, decreased strength, decreased ROM. ACTIVITY LIMITATIONS: Lifting, standing, walking, squatting, transfers, locomotion level PARTICIPATION LIMITATIONS: Cleaning, laundry, interpersonal relationships, driving, yardwork, community activity.  PERSONAL FACTORS: Age, behavior pattern, education, past/current experiences, transportation, profession  are also affecting patient's functional outcome.  REHAB POTENTIAL: Good CLINICAL DECISION MAKING: Medium  EVALUATION COMPLEXITY: Moderate   PLAN:  PT FREQUENCY: 1-2x/week  PT DURATION: 8 weeks  PLANNED INTERVENTIONS: 97110-Therapeutic exercises, 97530- Therapeutic activity, [id number]- Neuromuscular re-education,  02464- Self Care, 02859- Manual therapy, [phone]- Gait training, [phone]-  Electrical stimulation (manual), Patient/Family education, Balance training, Stair training, Joint mobilization, Joint manipulation, DME instructions, Cryotherapy, and Moist heat  PLAN FOR NEXT SESSION:  Continue with dynamic progress balance activities Continue with Dual task activities with balance Continue with progressive LE strengthening Add to HEP as appropriate.   11:10 AM, 01/27/24 Peggye JAYSON Linear, PT, DPT Physical Therapist -  [facility]  Outpatient Physical Therapy- Main Campus [phone]

## 2024-01-29 ENCOUNTER — Telehealth: Payer: Self-pay | Admitting: Nurse Practitioner

## 2024-01-29 ENCOUNTER — Ambulatory Visit

## 2024-01-29 DIAGNOSIS — M6281 Muscle weakness (generalized): Secondary | ICD-10-CM

## 2024-01-29 DIAGNOSIS — R2681 Unsteadiness on feet: Secondary | ICD-10-CM

## 2024-01-29 DIAGNOSIS — R296 Repeated falls: Secondary | ICD-10-CM | POA: Diagnosis not present

## 2024-01-29 NOTE — Therapy (Signed)
 OUTPATIENT PHYSICAL THERAPY TREATMENT  Patient Name: Sonya Park MRN: [id number] DOB:January 08, 1941, 83 y.o., female Today's Date: 01/29/2024  PCP: Liana Fish, NP REFERRING PROVIDER: Maree Jannett POUR, MD  END OF SESSION:  PT End of Session - 01/29/24 1112     Visit Number 11    Number of Visits 16    Date for Recertification  02/05/24    Authorization Type [employer] Medicare    Authorization Time Period 12/11/23-02/05/24    Progress Note Due on Visit 20    PT Start Time 1110    PT Stop Time 1140    PT Time Calculation (min) 30 min    Equipment Utilized During Treatment Gait belt    Activity Tolerance Patient tolerated treatment well;No increased pain;Patient limited by fatigue    Behavior During Therapy [facility] for tasks assessed/performed          Past Medical History:  Diagnosis Date   Arthritis    Back pain    Insomnia    Medical history non-contributory    Osteoporosis    Past Surgical History:  Procedure Laterality Date   ABDOMINAL HYSTERECTOMY     BREAST LUMPECTOMY Right    COLONOSCOPY WITH PROPOFOL  N/A 07/18/2015   Procedure: COLONOSCOPY WITH PROPOFOL ;  Surgeon: Rogelia Copping, MD;  Location: [facility] END[facility];  Service: Endoscopy;  Laterality: N/A;   COSMETIC SURGERY     GASTRIC BYPASS     X2   HEMORROIDECTOMY     KNEE ARTHROPLASTY Right 12/13/2015   Procedure: COMPUTER ASSISTED TOTAL KNEE ARTHROPLASTY;  Surgeon: Lynwood SHAUNNA Hue, MD;  Location: [facility] ORS;  Service: Orthopedics;  Laterality: Right;   KNEE ARTHROSCOPY     REPLACEMENT TOTAL KNEE Left    SHOULDER SURGERY Right    Patient Active Problem List   Diagnosis Date Noted   Tremor of right hand 11/24/2023   Sacral back pain 11/24/2023   Closed fracture of left inferior pubic ramus (HCC) 06/25/2023   Closed fracture of left distal radius 06/14/2023   Closed pelvic fracture (HCC) 06/13/2023   HTN (hypertension) 06/13/2023   Chronic diastolic CHF (congestive heart failure) (HCC) 06/13/2023   Wrist fracture,  closed, left, initial encounter 06/13/2023   Vitamin D  deficiency 04/12/2023   Hypocalcemia 04/12/2023   Iron deficiency anemia 04/16/2022   Closed displaced intertrochanteric fracture of right femur (HCC) 03/05/2022   Closed fracture of proximal end of right humerus 03/05/2022   Fracture of right hip (HCC) 02/03/2022   Shoulder fracture, right, closed, initial encounter 02/03/2022   Fall at home, initial encounter 02/03/2022   Neck pain 10/24/2021   Irritable bowel syndrome 10/14/2021   Obstructive sleep apnea of adult 10/14/2021   Vulvovaginitis 07/27/2021   Hypoglycemia 04/11/2021   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 01/16/2021   Vertigo 06/09/2020   Rotator cuff arthropathy of right shoulder 03/22/2020   Hyperlipidemia 02/21/2020   Need for COVID-19 vaccine 02/14/2020   Essential hypertension 09/02/2019   Seasonal allergic rhinitis due to pollen 09/02/2019   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) 11/17/2017   Lymphedema 08/11/2017   Anemia, macrocytic, nutritional    Macrocytic anemia    Dysphagia    Itching    Screening for colon cancer    LFT elevation    Severe malnutrition    Adult failure to thrive    Hyperbilirubinemia    Alcoholic liver failure (HCC)    Thrombocytopenia    Pressure  injury of skin 01/19/2017   Bacteremia    Palliative care by specialist    Goals of care, counseling/discussion    Alcohol abuse 01/15/2017   Facial trauma 01/14/2017   Osteoporosis 01/14/2017   Neutropenia 01/14/2017   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   Special screening for malignant neoplasms, colon    Benign neoplasm of transverse colon    Fracture of right inferior pubic ramus (HCC) 09/17/2014   Compression fracture of L3 vertebra (HCC) 09/17/2014   Closed wedge compression fracture of third lumbar vertebra (HCC) 09/17/2014   Other specified fracture of  right pubis, initial encounter for closed fracture (HCC) 09/17/2014   ONSET DATE: chronic REFERRING DIAG: falls THERAPY DIAG:  Repeated falls  Unsteadiness on feet  Muscle weakness (generalized)  Rationale for Evaluation and Treatment: Rehabilitation  SUBJECTIVE:                                                                                                                                                                                             SUBJECTIVE STATEMENT: No updates today from patient. Pt doing well. Pt has a lot of yardwork to do today.   PERTINENT HISTORY:   83yoF who is referred to OPPT for imbalance and multiple falls since 2018. Most recently sustained a pelvis fracture in 2025 s/p fall and Rt hip fracture s/p fall 2024. 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 Pt is currently back to her baseline which includes ad lib AMB without device, however she has not tolerated community distances in years due to chronic low back pain, is more recently limited by SOB and chest tightness with walking longer distances. Pt is fairly active at home, plans to leave here after evaluation visit and help her husband finish putting out more mulch at the house.   PAIN:  Are you having pain? no pain at rest.    PRECAUTIONS: falls  WEIGHT BEARING RESTRICTIONS: None  FALLS: Has patient fallen in last 6 months? Yes   LIVING ENVIRONMENT: Lives with: husband  Lives in: 2 level house, lives on main level  Stairs: 2 separate steps Has following equipment at home: WC, 5 canes, walker  PLOF: has recently rehabbed back to walking without a cane  PATIENT GOALS: stop falling   OBJECTIVE:  TREATMENT DATE 01/29/24 :  -lateral stepping cable resisted over 3 canes and red mat: 12.5lb resistance, 3lb perturbation stone tethered to cable. (Fairly lengthy time component and fatigue,  plan to reduce)   -lateral stepping cable resisted over 3 canes and red mat: 10.5lb resistance, 2lb perturbation stone tethered to cable.  -lateral stepping cable resisted over 3 canes and red mat: 10.5lb resistance, 2lb perturbation stone tethered to cable.   PATIENT EDUCATION: Education details: repeat explanation of [employer] parking and volunteer services offering; explained how Rt hip instability is causing Left toe catching issues;  Person educated: Patient Education method: discussion  Education comprehension: Fair (some memory retention difficulty)   HOME EXERCISE PROGRAM:   GOALS: Goals reviewed with patient? Yes   SHORT TERM GOALS: Target date: 01/10/24  Pt to demonstrate improved cervical rotation ROM right side >40 degrees to improve safety with scanning driving and reduce need for full body rotation during household activity.  Baseline: eval: 30 degrees Rt rotation.  Goal status: INITIAL  2.  Pt to demonstrate 5xSTS in <11.5sec hands free and without LOB to demonstrate improve BLE power.  Baseline: 01/27/24: 11.16sec; 9.70sec Goal status: MET  3.  Pt will demonstrate ability to hold tandem stance balance > 20sec bilat to demonstrate improved hip strength/proprioception for balance.  Baseline: 01/27/24: ~5sec each way Goal status: INITIAL  LONG TERM GOALS: Target date: 02/10/24  Pt to improve Berg Balance Test score to >52 to indicate reduced risk of falls.  Baseline: 45 Goal status: INITIAL  2.  Pt to demonstrate 5x STS <10.4sec hands free and without LOB to show improved power in BLE.  Baseline: 01/27/24: 11.16sec; 9.70sec Goal status: MET  3.  Pt to demonstrate ability to perform >60sec tandem stance bilat.  Baseline: <5secH 01/27/24: ~5secH  Goal status: INITIAL  4.  Pt to demonstrate improved Right cervical rotation ROM >46 degrees to improve safety with visual scanning in car and while working in yard.  Baseline: 30 degrees Goal status:  INITIAL  ASSESSMENT:  CLINICAL IMPRESSION: Spent most session working on lateral perturbation training and core motor control. This remains the biggest area of difficulty since starting PT. Patient will benefit from skilled physical therapy intervention to reduce deficits and impairments identified in evaluation, in order to reduce pain, improve quality of life, and maximize activity tolerance for ADL, IADL, and leisure/fitness. Physical therapy will help pt achieve long and short term goals of care.   OBJECTIVE IMPAIRMENTS: Decreased knowledge of condition, decreased use of DME, decreased mobility, difficulty walking, decreased strength, decreased ROM. ACTIVITY LIMITATIONS: Lifting, standing, walking, squatting, transfers, locomotion level PARTICIPATION LIMITATIONS: Cleaning, laundry, interpersonal relationships, driving, yardwork, community activity.  PERSONAL FACTORS: Age, behavior pattern, education, past/current experiences, transportation, profession  are also affecting patient's functional outcome.  REHAB POTENTIAL: Good CLINICAL DECISION MAKING: Medium  EVALUATION COMPLEXITY: Moderate   PLAN:  PT FREQUENCY: 1-2x/week  PT DURATION: 8 weeks  PLANNED INTERVENTIONS: 97110-Therapeutic exercises, 97530- Therapeutic activity, [id number]- Neuromuscular re-education, 97535- Self Care, 02859- Manual therapy, [phone]- Gait training, [phone]- Electrical stimulation (manual), Patient/Family education, Balance training, Stair training, Joint mobilization, Joint manipulation, DME instructions, Cryotherapy, and Moist heat  PLAN FOR NEXT SESSION:  Continue with dynamic progress balance activities Continue with Dual task activities with balance Continue with progressive LE strengthening Add to HEP as appropriate.   11:25 AM, 01/29/24 Peggye JAYSON Linear, PT, DPT Physical Therapist - [facility]  Outpatient Physical Therapy- Main Campus [phone]

## 2024-01-29 NOTE — Telephone Encounter (Signed)
 Patient called stating she was told by [facility] pre-service center that her copay for u/s will be about $800. Ivm for someone to return my call to confirm-Toni

## 2024-01-30 ENCOUNTER — Ambulatory Visit
Admission: RE | Admit: 2024-01-30 | Discharge: 2024-01-30 | Disposition: A | Discharge status: Home / Self Care | Source: Ambulatory Visit | Attending: Internal Medicine | Admitting: Internal Medicine

## 2024-01-30 DIAGNOSIS — R197 Diarrhea, unspecified: Secondary | ICD-10-CM | POA: Insufficient documentation

## 2024-01-30 DIAGNOSIS — R1013 Epigastric pain: Secondary | ICD-10-CM | POA: Insufficient documentation

## 2024-02-01 ENCOUNTER — Encounter: Payer: Self-pay | Admitting: Nurse Practitioner

## 2024-02-02 ENCOUNTER — Ambulatory Visit: Payer: Self-pay | Admitting: Internal Medicine

## 2024-02-02 NOTE — Telephone Encounter (Signed)
-----   Message from [facility] sent at 02/02/2024  9:40 AM EDT ----- Abnormal u/s, has gallstones, can come back for f/u to discuss results in 2 weeks  ----- Message ----- From: Interface, Rad Results In Sent: 02/02/2024   2:49 AM EDT To: Sigrid CHRISTELLA Bathe, MD

## 2024-02-02 NOTE — Progress Notes (Signed)
 Abnormal u/s, has gallstones, can come back for f/u to discuss results in 2 weeks

## 2024-02-02 NOTE — Telephone Encounter (Signed)
 Spoke with patient regarding u/s and booked 2 week follow-up per [facility].

## 2024-02-03 ENCOUNTER — Ambulatory Visit: Admitting: Physical Therapy

## 2024-02-03 DIAGNOSIS — R296 Repeated falls: Secondary | ICD-10-CM

## 2024-02-03 DIAGNOSIS — M6281 Muscle weakness (generalized): Secondary | ICD-10-CM

## 2024-02-03 DIAGNOSIS — R2681 Unsteadiness on feet: Secondary | ICD-10-CM

## 2024-02-03 NOTE — Therapy (Signed)
 OUTPATIENT PHYSICAL THERAPY TREATMENT RE-CERTIFICATION NOTE    Patient Name: Sonya Park MRN: [id number] DOB:[birth year], 83 y.o., female Today's Date: 02/03/2024  PCP: Liana Fish, NP REFERRING PROVIDER: Maree Jannett POUR, MD  END OF SESSION:  PT End of Session - 02/03/24 1150     Visit Number 12    Number of Visits 16    Date for Recertification  03/30/24    Authorization Type [employer] Medicare    Authorization Time Period 12/11/23-02/05/24    Progress Note Due on Visit 20    PT Start Time 1147    PT Stop Time 1227    PT Time Calculation (min) 40 min    Equipment Utilized During Treatment Gait belt    Activity Tolerance Patient tolerated treatment well;No increased pain;Patient limited by fatigue    Behavior During Therapy [facility] for tasks assessed/performed           Past Medical History:  Diagnosis Date   Arthritis    Back pain    Insomnia    Medical history non-contributory    Osteoporosis    Past Surgical History:  Procedure Laterality Date   ABDOMINAL HYSTERECTOMY     BREAST LUMPECTOMY Right    COLONOSCOPY WITH PROPOFOL  N/A 07/18/2015   Procedure: COLONOSCOPY WITH PROPOFOL ;  Surgeon: Rogelia Copping, MD;  Location: [facility] ENDOSCOPY;  Service: Endoscopy;  Laterality: N/A;   COSMETIC SURGERY     GASTRIC BYPASS     X2   HEMORROIDECTOMY     KNEE ARTHROPLASTY Right 12/13/2015   Procedure: COMPUTER ASSISTED TOTAL KNEE ARTHROPLASTY;  Surgeon: Lynwood SHAUNNA Hue, MD;  Location: [facility] ORS;  Service: Orthopedics;  Laterality: Right;   KNEE ARTHROSCOPY     REPLACEMENT TOTAL KNEE Left    SHOULDER SURGERY Right    Patient Active Problem List   Diagnosis Date Noted   Tremor of right hand 11/24/2023   Sacral back pain 11/24/2023   Closed fracture of left inferior pubic ramus (HCC) 06/25/2023   Closed fracture of left distal radius 06/14/2023   Closed pelvic fracture (HCC) 06/13/2023   HTN (hypertension) 06/13/2023   Chronic diastolic CHF (congestive heart failure) (HCC)  06/13/2023   Wrist fracture, closed, left, initial encounter 06/13/2023   Vitamin D  deficiency 04/12/2023   Hypocalcemia 04/12/2023   Iron deficiency anemia 04/16/2022   Closed displaced intertrochanteric fracture of right femur (HCC) 03/05/2022   Closed fracture of proximal end of right humerus 03/05/2022   Fracture of right hip (HCC) 02/03/2022   Shoulder fracture, right, closed, initial encounter 02/03/2022   Fall at home, initial encounter 02/03/2022   Neck pain 10/24/2021   Irritable bowel syndrome 10/14/2021   Obstructive sleep apnea of adult 10/14/2021   Vulvovaginitis 07/27/2021   Hypoglycemia 04/11/2021   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 01/16/2021   Vertigo 06/09/2020   Rotator cuff arthropathy of right shoulder 03/22/2020   Hyperlipidemia 02/21/2020   Need for COVID-19 vaccine 02/14/2020   Essential hypertension 09/02/2019   Seasonal allergic rhinitis due to pollen 09/02/2019   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) 11/17/2017   Lymphedema 08/11/2017   Anemia, macrocytic, nutritional    Macrocytic anemia    Dysphagia    Itching    Screening for colon cancer    LFT elevation    Severe malnutrition    Adult failure to thrive    Hyperbilirubinemia    Thrombocytopenia    Pressure injury of  skin 01/19/2017   Bacteremia    Palliative care by specialist    Goals of care, counseling/discussion    Alcohol abuse 01/15/2017   Facial trauma 01/14/2017   Osteoporosis 01/14/2017   Neutropenia 01/14/2017   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   Special screening for malignant neoplasms, colon    Benign neoplasm of transverse colon    Fracture of right inferior pubic ramus (HCC) 09/17/2014   Compression fracture of L3 vertebra (HCC) 09/17/2014   Closed wedge compression fracture of third lumbar vertebra (HCC) 09/17/2014   Other specified fracture of  right pubis, initial encounter for closed fracture (HCC) 09/17/2014   ONSET DATE: chronic REFERRING DIAG: falls THERAPY DIAG:  Repeated falls  Unsteadiness on feet  Muscle weakness (generalized)  Rationale for Evaluation and Treatment: Rehabilitation  SUBJECTIVE:                                                                                                                                                                                             SUBJECTIVE STATEMENT: No updates today from patient. Pt doing well. Pt still working on cleaning garage for floor redo.  PERTINENT HISTORY:   83yoF who is referred to OPPT for imbalance and multiple falls since 2018. Most recently sustained a pelvis fracture in 2025 s/p fall and Rt hip fracture s/p fall 2024. 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 Pt is currently back to her baseline which includes ad lib AMB without device, however she has not tolerated community distances in years due to chronic low back pain, is more recently limited by SOB and chest tightness with walking longer distances. Pt is fairly active at home, plans to leave here after evaluation visit and help her husband finish putting out more mulch at the house.   PAIN:  Are you having pain? no pain at rest.    PRECAUTIONS: falls  WEIGHT BEARING RESTRICTIONS: None  FALLS: Has patient fallen in last 6 months? Yes   LIVING ENVIRONMENT: Lives with: husband  Lives in: 2 level house, lives on main level  Stairs: 2 separate steps Has following equipment at home: WC, 5 canes, walker  PLOF: has recently rehabbed back to walking without a cane  PATIENT GOALS: stop falling   OBJECTIVE:  TREATMENT DATE 02/03/24 : Physical Performance Test or Measurement: a  physical performance test(s) or measurement (eg,  musculoskeletal, functional capacity), with written  report,  each 15 mins    Patient demonstrates increased fall risk as noted by score of  52 /56 on Berg Balance Scale.  (<36= high risk for falls, close to 100%; 37-45 significant >80%; 46-51 moderate >50%; 52-55 lower >25%)    [facility] PT Assessment - 02/03/24 0001       Berg Balance Test   Sit to Stand Able to stand without using hands and stabilize independently    Standing Unsupported Able to stand safely 2 minutes    Sitting with Back Unsupported but Feet Supported on Floor or Stool Able to sit safely and securely 2 minutes    Stand to Sit Sits safely with minimal use of hands    Transfers Able to transfer safely, minor use of hands    Standing Unsupported with Eyes Closed Able to stand 10 seconds safely    Standing Unsupported with Feet Together Able to place feet together independently and stand 1 minute safely    From Standing, Reach Forward with Outstretched Arm Can reach confidently >25 cm (10)    From Standing Position, Pick up Object from Floor Able to pick up shoe safely and easily    From Standing Position, Turn to Look Behind Over each Shoulder Looks behind one side only/other side shows less weight shift    Turn 360 Degrees Able to turn 360 degrees safely in 4 seconds or less    Standing Unsupported, Alternately Place Feet on Step/Stool Able to stand independently and safely and complete 8 steps in 20 seconds    Standing Unsupported, One Foot in Front Able to plae foot ahead of the other independently and hold 30 seconds    Standing on One Leg Able to lift leg independently and hold equal to or more than 3 seconds    Total Score 52      Functional Gait  Assessment   Gait assessed  Yes (P)     Gait Level Surface Walks 20 ft in less than 5.5 sec, no assistive devices, good speed, no evidence for imbalance, normal gait pattern, deviates no more than 6 in outside of the 12 in walkway width. (P)     Change in Gait Speed Able to change speed, demonstrates mild gait deviations,  deviates 6-10 in outside of the 12 in walkway width, or no gait deviations, unable to achieve a major change in velocity, or uses a change in velocity, or uses an assistive device. (P)     Gait with Horizontal Head Turns Performs head turns smoothly with slight change in gait velocity (eg, minor disruption to smooth gait path), deviates 6-10 in outside 12 in walkway width, or uses an assistive device. (P)     Gait with Vertical Head Turns Performs task with slight change in gait velocity (eg, minor disruption to smooth gait path), deviates 6 - 10 in outside 12 in walkway width or uses assistive device (P)     Gait and Pivot Turn Pivot turns safely in greater than 3 sec and stops with no loss of balance, or pivot turns safely within 3 sec and stops with mild imbalance, requires small steps to catch balance. (P)     Step Over Obstacle Is able to step over one shoe box (4.5 in total height) but must slow down and adjust steps to clear box safely. May require verbal cueing. (  P)     Gait with Narrow Base of Support Ambulates 7-9 steps. (P)     Gait with Eyes Closed Walks 20 ft, uses assistive device, slower speed, mild gait deviations, deviates 6-10 in outside 12 in walkway width. Ambulates 20 ft in less than 9 sec but greater than 7 sec. (P)     Ambulating Backwards Walks 20 ft, uses assistive device, slower speed, mild gait deviations, deviates 6-10 in outside 12 in walkway width. (P)     Steps Alternating feet, must use rail. (P)     Total Score 20 (P)           NMR: To facilitate reeducation of movement, balance, posture, coordination, and/or proprioception/kinesthetic sense.    -lateral stepping cable resisted over 2 canes  12.5lb resistance, x 3 bidirectional   PATIENT EDUCATION: Education details: repeat explanation of vallet parking and volunteer services offering; explained how Rt hip instability is causing Left toe catching issues;  Person educated: Patient Education method: discussion   Education comprehension: Fair (some memory retention difficulty)   HOME EXERCISE PROGRAM:   GOALS: Goals reviewed with patient? Yes   SHORT TERM GOALS: Target date: 03/02/2024   Pt to demonstrate improved cervical rotation ROM right side >40 degrees to improve safety with scanning driving and reduce need for full body rotation during household activity.  Baseline: eval: 30 degrees Rt rotation. 10/28: 28 degrees lateral rotation to the R  Goal status: NOT MET  2.  Pt to demonstrate 5xSTS in <11.5sec hands free and without LOB to demonstrate improve BLE power.  Baseline: 01/27/24: 11.16sec; 9.70sec Goal status: MET  3.  Pt will demonstrate ability to hold tandem stance balance > 20sec bilat to demonstrate improved hip strength/proprioception for balance.  Baseline: 01/27/24: ~5sec each way 10/28: 60 sec ea LE Goal status: MET  LONG TERM GOALS: Target date:03/30/2024   Pt to improve Berg Balance Test score to >52 to indicate reduced risk of falls.  Baseline: 45 Goal status: MET  2.  Pt to demonstrate 5x STS <10.4sec hands free and without LOB to show improved power in BLE.  Baseline: 01/27/24: 11.16sec; 9.70sec Goal status: MET  3.  Pt to demonstrate ability to perform >60sec tandem stance bilat.  Baseline: <5secH 01/27/24: ~5secH 10/28: 60 sec ea LE, more difficulty / sway with R LE posterior  Goal status: MET  4.  Pt to demonstrate improved Right cervical rotation ROM >46 degrees to improve safety with visual scanning in car and while working in yard.  Baseline: 30 degrees 10/28: 28 degrees lateral rotation to the R  Goal status: NOT MET  5.  Pt will improve FGA by 3 points or greater in order to indicate improved dynamic balance and decreased fall risk.  Baseline: 20 Goal status: INITIAL     ASSESSMENT:  CLINICAL IMPRESSION:  Patient arrived with good motivation for completion of pt activities.  Pt demonstrates great progress towards her long term goals in  relation to her balance and strength in her LE. She is still very limited with cervical rotation to the R but her balance remains a priority for her going forward. Plan to continue therapy for 8 more weeks, goals have been updated and added. Pt showing balance deficits with FGA and minimally with BERG balance score, although she has met her initial BERg goal.  Patient's condition has the potential to improve in response to therapy. Maximum improvement is yet to be obtained. The anticipated improvement is attainable and reasonable in  a generally predictable time. Pt will continue to benefit from skilled physical therapy intervention to address impairments, improve QOL, and attain therapy goals.    OBJECTIVE IMPAIRMENTS: Decreased knowledge of condition, decreased use of DME, decreased mobility, difficulty walking, decreased strength, decreased ROM. ACTIVITY LIMITATIONS: Lifting, standing, walking, squatting, transfers, locomotion level PARTICIPATION LIMITATIONS: Cleaning, laundry, interpersonal relationships, driving, yardwork, community activity.  PERSONAL FACTORS: Age, behavior pattern, education, past/current experiences, transportation, profession  are also affecting patient's functional outcome.  REHAB POTENTIAL: Good CLINICAL DECISION MAKING: Medium  EVALUATION COMPLEXITY: Moderate   PLAN:  PT FREQUENCY: 1-2x/week  PT DURATION: 8 weeks  PLANNED INTERVENTIONS: 97110-Therapeutic exercises, 97530- Therapeutic activity, [id number]- Neuromuscular re-education, 97535- Self Care, 02859- Manual therapy, [phone]- Gait training, [phone]- Electrical stimulation (manual), Patient/Family education, Balance training, Stair training, Joint mobilization, Joint manipulation, DME instructions, Cryotherapy, and Moist heat  PLAN FOR NEXT SESSION:  Continue with dynamic progress balance activities Continue with Dual task activities with balance Continue with progressive LE strengthening Add to HEP as  appropriate.   1:16 PM, 02/03/24  Note: Portions of this document were prepared using Dragon voice recognition software and although reviewed may contain unintentional dictation errors in syntax, grammar, or spelling.  Lonni KATHEE Gainer PT ,DPT Physical Therapist- [place]  [facility]

## 2024-02-05 ENCOUNTER — Ambulatory Visit

## 2024-02-10 ENCOUNTER — Ambulatory Visit: Attending: Neurology

## 2024-02-10 DIAGNOSIS — M25532 Pain in left wrist: Secondary | ICD-10-CM | POA: Diagnosis present

## 2024-02-10 DIAGNOSIS — M6281 Muscle weakness (generalized): Secondary | ICD-10-CM | POA: Diagnosis present

## 2024-02-10 DIAGNOSIS — R296 Repeated falls: Secondary | ICD-10-CM | POA: Diagnosis present

## 2024-02-10 DIAGNOSIS — R2681 Unsteadiness on feet: Secondary | ICD-10-CM | POA: Insufficient documentation

## 2024-02-10 DIAGNOSIS — M25632 Stiffness of left wrist, not elsewhere classified: Secondary | ICD-10-CM | POA: Insufficient documentation

## 2024-02-10 NOTE — Therapy (Signed)
 OUTPATIENT PHYSICAL THERAPY TREATMENT NOTE    Patient Name: Sonya Park MRN: [id number] DOB:Oct 02, 1940, 83 y.o., female Today's Date: 02/10/2024  PCP: Liana Fish, NP REFERRING PROVIDER: Maree Jannett POUR, MD  END OF SESSION:  PT End of Session - 02/10/24 1201     Visit Number 13    Number of Visits 29    Date for Recertification  03/30/24    Authorization Type [employer] Medicare    Authorization Time Period 12/11/23-02/05/24    Progress Note Due on Visit 20    PT Start Time 1157    PT Stop Time 1231    PT Time Calculation (min) 34 min    Equipment Utilized During Treatment Gait belt    Activity Tolerance Patient tolerated treatment well;No increased pain;Patient limited by fatigue    Behavior During Therapy [facility] for tasks assessed/performed            Past Medical History:  Diagnosis Date   Arthritis    Back pain    Insomnia    Medical history non-contributory    Osteoporosis    Past Surgical History:  Procedure Laterality Date   ABDOMINAL HYSTERECTOMY     BREAST LUMPECTOMY Right    COLONOSCOPY WITH PROPOFOL  N/A 07/18/2015   Procedure: COLONOSCOPY WITH PROPOFOL ;  Surgeon: Rogelia Copping, MD;  Location: [facility] ENDOSCOPY;  Service: Endoscopy;  Laterality: N/A;   COSMETIC SURGERY     GASTRIC BYPASS     X2   HEMORROIDECTOMY     KNEE ARTHROPLASTY Right 12/13/2015   Procedure: COMPUTER ASSISTED TOTAL KNEE ARTHROPLASTY;  Surgeon: Lynwood SHAUNNA Hue, MD;  Location: [facility] ORS;  Service: Orthopedics;  Laterality: Right;   KNEE ARTHROSCOPY     REPLACEMENT TOTAL KNEE Left    SHOULDER SURGERY Right    Patient Active Problem List   Diagnosis Date Noted   Tremor of right hand 11/24/2023   Sacral back pain 11/24/2023   Closed fracture of left inferior pubic ramus (HCC) 06/25/2023   Closed fracture of left distal radius 06/14/2023   Closed pelvic fracture (HCC) 06/13/2023   HTN (hypertension) 06/13/2023   Chronic diastolic CHF (congestive heart failure) (HCC) 06/13/2023   Wrist  fracture, closed, left, initial encounter 06/13/2023   Vitamin D  deficiency 04/12/2023   Hypocalcemia 04/12/2023   Iron deficiency anemia 04/16/2022   Closed displaced intertrochanteric fracture of right femur (HCC) 03/05/2022   Closed fracture of proximal end of right humerus 03/05/2022   Fracture of right hip (HCC) 02/03/2022   Shoulder fracture, right, closed, initial encounter 02/03/2022   Fall at home, initial encounter 02/03/2022   Neck pain 10/24/2021   Irritable bowel syndrome 10/14/2021   Obstructive sleep apnea of adult 10/14/2021   Vulvovaginitis 07/27/2021   Hypoglycemia 04/11/2021   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 01/16/2021   Vertigo 06/09/2020   Rotator cuff arthropathy of right shoulder 03/22/2020   Hyperlipidemia 02/21/2020   Need for COVID-19 vaccine 02/14/2020   Essential hypertension 09/02/2019   Seasonal allergic rhinitis due to pollen 09/02/2019   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) 11/17/2017   Lymphedema 08/11/2017   Anemia, macrocytic, nutritional    Macrocytic anemia    Dysphagia    Itching    Screening for colon cancer    LFT elevation    Severe malnutrition    Adult failure to thrive    Hyperbilirubinemia    Thrombocytopenia    Pressure injury of  skin 01/19/2017   Bacteremia    Palliative care by specialist    Goals of care, counseling/discussion    Alcohol abuse 01/15/2017   Facial trauma 01/14/2017   Osteoporosis 01/14/2017   Neutropenia 01/14/2017   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   Special screening for malignant neoplasms, colon    Benign neoplasm of transverse colon    Fracture of right inferior pubic ramus (HCC) 09/17/2014   Compression fracture of L3 vertebra (HCC) 09/17/2014   Closed wedge compression fracture of third lumbar vertebra (HCC) 09/17/2014   Other specified fracture of right pubis, initial  encounter for closed fracture (HCC) 09/17/2014   ONSET DATE: chronic REFERRING DIAG: falls THERAPY DIAG:  Repeated falls  Unsteadiness on feet  Muscle weakness (generalized)  Rationale for Evaluation and Treatment: Rehabilitation  SUBJECTIVE:                                                                                                                                                                                             SUBJECTIVE STATEMENT: Patient reports balance is still her biggest issue and worried about falling.     PERTINENT HISTORY:   83yoF who is referred to OPPT for imbalance and multiple falls since 2018. Most recently sustained a pelvis fracture in 2025 s/p fall and Rt hip fracture s/p fall 2024. 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 Pt is currently back to her baseline which includes ad lib AMB without device, however she has not tolerated community distances in years due to chronic low back pain, is more recently limited by SOB and chest tightness with walking longer distances. Pt is fairly active at home, plans to leave here after evaluation visit and help her husband finish putting out more mulch at the house.   PAIN:  Are you having pain? no pain at rest.    PRECAUTIONS: falls  WEIGHT BEARING RESTRICTIONS: None  FALLS: Has patient fallen in last 6 months? Yes   LIVING ENVIRONMENT: Lives with: husband  Lives in: 2 level house, lives on main level  Stairs: 2 separate steps Has following equipment at home: WC, 5 canes, walker  PLOF: has recently rehabbed back to walking without a cane  PATIENT GOALS: stop falling   OBJECTIVE:  TREATMENT DATE 02/10/24 :                Self care: - Instructed in cervical Rotation activity and provided handout for home use to improve her cervical ROM- Hold 30sec x 4 ea direction  NMR: To facilitate  reeducation of movement, balance, posture, coordination, and/or proprioception/kinesthetic sense.  -Dynamic high knee march forward then retro large steps bwd in // bars x 10 - VC for step height and step length bwd.   -lateral sidestepping RTB around ankles for resistance in // bars x 5- down and back - Patient verbalized fatigue with last couple of trials  -Tandem walking in //  bars- 10 times back and forth in // bars  - Bwd tandem in // bars x 10  (Initially unsteady yet did improve with practice)   PATIENT EDUCATION: Education details: repeat explanation of vallet parking and volunteer services offering; explained how Rt hip instability is causing Left toe catching issues;  Person educated: Patient Education method: discussion  Education comprehension: Fair (some memory retention difficulty)   HOME EXERCISE PROGRAM:  Access Code: [email or web address] URL: [email or web address] Date: 02/10/2024 Prepared by: Reyes London  Exercises - Seated Assisted Cervical Rotation with Towel  - 1 x daily - 3 sets - 10 reps   GOALS: Goals reviewed with patient? Yes   SHORT TERM GOALS: Target date: 03/02/2024   Pt to demonstrate improved cervical rotation ROM right side >40 degrees to improve safety with scanning driving and reduce need for full body rotation during household activity.  Baseline: eval: 30 degrees Rt rotation. 10/28: 28 degrees lateral rotation to the R  Goal status: NOT MET  2.  Pt to demonstrate 5xSTS in <11.5sec hands free and without LOB to demonstrate improve BLE power.  Baseline: 01/27/24: 11.16sec; 9.70sec Goal status: MET  3.  Pt will demonstrate ability to hold tandem stance balance > 20sec bilat to demonstrate improved hip strength/proprioception for balance.  Baseline: 01/27/24: ~5sec each way 10/28: 60 sec ea LE Goal status: MET  LONG TERM GOALS: Target date:03/30/2024   Pt to improve Berg Balance Test score to >52 to indicate reduced risk of falls.   Baseline: 45 Goal status: MET  2.  Pt to demonstrate 5x STS <10.4sec hands free and without LOB to show improved power in BLE.  Baseline: 01/27/24: 11.16sec; 9.70sec Goal status: MET  3.  Pt to demonstrate ability to perform >60sec tandem stance bilat.  Baseline: <5secH 01/27/24: ~5secH 10/28: 60 sec ea LE, more difficulty / sway with R LE posterior  Goal status: MET  4.  Pt to demonstrate improved Right cervical rotation ROM >46 degrees to improve safety with visual scanning in car and while working in yard.  Baseline: 30 degrees 10/28: 28 degrees lateral rotation to the R  Goal status: NOT MET  5.  Pt will improve FGA by 3 points or greater in order to indicate improved dynamic balance and decreased fall risk.  Baseline: 20 Goal status: INITIAL     ASSESSMENT:  CLINICAL IMPRESSION: Treatment limited to late arrival but as usual- patient arrived with good motivation for completion of pt activities.  Treated focused on balance as planned per last visit. Reviewed briefly a assisted towel stretch for cervical Rotation and then proceeded to performing balance activities that she could reproduce in the home. She was initially challenged with decreased step width with resistance and later with improved tandem walking with practice. Pt will continue to benefit from skilled physical therapy intervention to  address impairments, improve QOL, and attain therapy goals.    OBJECTIVE IMPAIRMENTS: Decreased knowledge of condition, decreased use of DME, decreased mobility, difficulty walking, decreased strength, decreased ROM. ACTIVITY LIMITATIONS: Lifting, standing, walking, squatting, transfers, locomotion level PARTICIPATION LIMITATIONS: Cleaning, laundry, interpersonal relationships, driving, yardwork, community activity.  PERSONAL FACTORS: Age, behavior pattern, education, past/current experiences, transportation, profession  are also affecting patient's functional outcome.  REHAB POTENTIAL:  Good CLINICAL DECISION MAKING: Medium  EVALUATION COMPLEXITY: Moderate   PLAN:  PT FREQUENCY: 1-2x/week  PT DURATION: 8 weeks  PLANNED INTERVENTIONS: 97110-Therapeutic exercises, 97530- Therapeutic activity, [id number]- Neuromuscular re-education, 97535- Self Care, 02859- Manual therapy, [phone]- Gait training, [phone]- Electrical stimulation (manual), Patient/Family education, Balance training, Stair training, Joint mobilization, Joint manipulation, DME instructions, Cryotherapy, and Moist heat  PLAN FOR NEXT SESSION:  Continue with dynamic progress balance activities Continue with Dual task activities with balance Continue with progressive LE strengthening Add to HEP as appropriate.   1:32 PM, 02/10/24   Reyes LOISE London PT  Physical Therapist- [place]  [facility]

## 2024-02-12 ENCOUNTER — Ambulatory Visit: Admitting: Physical Therapy

## 2024-02-12 DIAGNOSIS — R296 Repeated falls: Secondary | ICD-10-CM

## 2024-02-12 DIAGNOSIS — M6281 Muscle weakness (generalized): Secondary | ICD-10-CM

## 2024-02-12 DIAGNOSIS — R2681 Unsteadiness on feet: Secondary | ICD-10-CM

## 2024-02-12 NOTE — Therapy (Signed)
 OUTPATIENT PHYSICAL THERAPY TREATMENT NOTE    Patient Name: Sonya Park MRN: [id number] DOB:Aug 30, 1940, 83 y.o., female Today's Date: 02/12/2024  PCP: Liana Fish, NP REFERRING PROVIDER: Maree Jannett POUR, MD  END OF SESSION:  PT End of Session - 02/12/24 1106     Visit Number 14    Number of Visits 29    Date for Recertification  03/30/24    Authorization Type [employer] Medicare    Authorization Time Period 12/11/23-02/05/24    Progress Note Due on Visit 20    Equipment Utilized During Treatment Gait belt    Activity Tolerance Patient tolerated treatment well;No increased pain;Patient limited by fatigue    Behavior During Therapy [facility] for tasks assessed/performed             Past Medical History:  Diagnosis Date   Arthritis    Back pain    Insomnia    Medical history non-contributory    Osteoporosis    Past Surgical History:  Procedure Laterality Date   ABDOMINAL HYSTERECTOMY     BREAST LUMPECTOMY Right    COLONOSCOPY WITH PROPOFOL  N/A 07/18/2015   Procedure: COLONOSCOPY WITH PROPOFOL ;  Surgeon: Rogelia Copping, MD;  Location: [facility] ENDOSCOPY;  Service: Endoscopy;  Laterality: N/A;   COSMETIC SURGERY     GASTRIC BYPASS     X2   HEMORROIDECTOMY     KNEE ARTHROPLASTY Right 12/13/2015   Procedure: COMPUTER ASSISTED TOTAL KNEE ARTHROPLASTY;  Surgeon: Lynwood SHAUNNA Hue, MD;  Location: [facility] ORS;  Service: Orthopedics;  Laterality: Right;   KNEE ARTHROSCOPY     REPLACEMENT TOTAL KNEE Left    SHOULDER SURGERY Right    Patient Active Problem List   Diagnosis Date Noted   Tremor of right hand 11/24/2023   Sacral back pain 11/24/2023   Closed fracture of left inferior pubic ramus (HCC) 06/25/2023   Closed fracture of left distal radius 06/14/2023   Closed pelvic fracture (HCC) 06/13/2023   HTN (hypertension) 06/13/2023   Chronic diastolic CHF (congestive heart failure) (HCC) 06/13/2023   Wrist fracture, closed, left, initial encounter 06/13/2023   Vitamin D  deficiency  04/12/2023   Hypocalcemia 04/12/2023   Iron deficiency anemia 04/16/2022   Closed displaced intertrochanteric fracture of right femur (HCC) 03/05/2022   Closed fracture of proximal end of right humerus 03/05/2022   Fracture of right hip (HCC) 02/03/2022   Shoulder fracture, right, closed, initial encounter 02/03/2022   Fall at home, initial encounter 02/03/2022   Neck pain 10/24/2021   Irritable bowel syndrome 10/14/2021   Obstructive sleep apnea of adult 10/14/2021   Vulvovaginitis 07/27/2021   Hypoglycemia 04/11/2021   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 01/16/2021   Vertigo 06/09/2020   Rotator cuff arthropathy of right shoulder 03/22/2020   Hyperlipidemia 02/21/2020   Need for COVID-19 vaccine 02/14/2020   Essential hypertension 09/02/2019   Seasonal allergic rhinitis due to pollen 09/02/2019   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) 11/17/2017   Lymphedema 08/11/2017   Anemia, macrocytic, nutritional    Macrocytic anemia    Dysphagia    Itching    Screening for colon cancer    LFT elevation    Severe malnutrition    Adult failure to thrive    Hyperbilirubinemia    Thrombocytopenia    Pressure injury of skin 01/19/2017   Bacteremia    Palliative care by specialist    Goals of care, counseling/discussion  Alcohol abuse 01/15/2017   Facial trauma 01/14/2017   Osteoporosis 01/14/2017   Neutropenia 01/14/2017   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   Special screening for malignant neoplasms, colon    Benign neoplasm of transverse colon    Fracture of right inferior pubic ramus (HCC) 09/17/2014   Compression fracture of L3 vertebra (HCC) 09/17/2014   Closed wedge compression fracture of third lumbar vertebra (HCC) 09/17/2014   Other specified fracture of right pubis, initial encounter for closed fracture (HCC) 09/17/2014   ONSET DATE:  chronic REFERRING DIAG: falls THERAPY DIAG:  Repeated falls  Unsteadiness on feet  Muscle weakness (generalized)  Rationale for Evaluation and Treatment: Rehabilitation  SUBJECTIVE:                                                                                                                                                                                             SUBJECTIVE STATEMENT: Patient reports balance is still her biggest issue and worried about falling.     PERTINENT HISTORY:   83yoF who is referred to OPPT for imbalance and multiple falls since 2018. Most recently sustained a pelvis fracture in 2025 s/p fall and Rt hip fracture s/p fall 2024. 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 Pt is currently back to her baseline which includes ad lib AMB without device, however she has not tolerated community distances in years due to chronic low back pain, is more recently limited by SOB and chest tightness with walking longer distances. Pt is fairly active at home, plans to leave here after evaluation visit and help her husband finish putting out more mulch at the house.   PAIN:  Are you having pain? no pain at rest.    PRECAUTIONS: falls  WEIGHT BEARING RESTRICTIONS: None  FALLS: Has patient fallen in last 6 months? Yes   LIVING ENVIRONMENT: Lives with: husband  Lives in: 2 level house, lives on main level  Stairs: 2 separate steps Has following equipment at home: WC, 5 canes, walker  PLOF: has recently rehabbed back to walking without a cane  PATIENT GOALS: stop falling   OBJECTIVE:  TREATMENT DATE 02/12/24 :  NMR: To facilitate reeducation of movement, balance, posture, coordination, and/or proprioception/kinesthetic sense.  Tandem balance 3 x 30 sec ea    -lateral sidestepping RTB around mid shank for resistance along support bar- down and back - 2  x 5 laps no UE-   1 LE on airex other on step 3 x 30 sec ea   -tandem walk along tile color change in floor x 4 laps   Dynamic march from airex tapping step 3 x 10   Lateral step ups no UE assit x 10 ea to step trainer  Unless otherwise stated, CGA was provided and gait belt donned in order to ensure pt safety   PATIENT EDUCATION: Education details: repeat explanation of vallet parking and volunteer services offering; explained how Rt hip instability is causing Left toe catching issues;  Person educated: Patient Education method: discussion  Education comprehension: Fair (some memory retention difficulty)   HOME EXERCISE PROGRAM:  Access Code: [email or web address] URL: [email or web address] Date: 02/10/2024 Prepared by: Reyes London  Exercises - Seated Assisted Cervical Rotation with Towel  - 1 x daily - 3 sets - 10 reps   GOALS: Goals reviewed with patient? Yes   SHORT TERM GOALS: Target date: 03/02/2024   Pt to demonstrate improved cervical rotation ROM right side >40 degrees to improve safety with scanning driving and reduce need for full body rotation during household activity.  Baseline: eval: 30 degrees Rt rotation. 10/28: 28 degrees lateral rotation to the R  Goal status: NOT MET  2.  Pt to demonstrate 5xSTS in <11.5sec hands free and without LOB to demonstrate improve BLE power.  Baseline: 01/27/24: 11.16sec; 9.70sec Goal status: MET  3.  Pt will demonstrate ability to hold tandem stance balance > 20sec bilat to demonstrate improved hip strength/proprioception for balance.  Baseline: 01/27/24: ~5sec each way 10/28: 60 sec ea LE Goal status: MET  LONG TERM GOALS: Target date:03/30/2024   Pt to improve Berg Balance Test score to >52 to indicate reduced risk of falls.  Baseline: 45 Goal status: MET  2.  Pt to demonstrate 5x STS <10.4sec hands free and without LOB to show improved power in BLE.  Baseline: 01/27/24: 11.16sec; 9.70sec Goal status:  MET  3.  Pt to demonstrate ability to perform >60sec tandem stance bilat.  Baseline: <5secH 01/27/24: ~5secH 10/28: 60 sec ea LE, more difficulty / sway with R LE posterior  Goal status: MET  4.  Pt to demonstrate improved Right cervical rotation ROM >46 degrees to improve safety with visual scanning in car and while working in yard.  Baseline: 30 degrees 10/28: 28 degrees lateral rotation to the R  Goal status: NOT MET  5.  Pt will improve FGA by 3 points or greater in order to indicate improved dynamic balance and decreased fall risk.  Baseline: 20 Goal status: INITIAL     ASSESSMENT:  CLINICAL IMPRESSION:  Treatment limited to late arrival but as usual- patient arrived with good motivation for completion of pt activities. Treated focused on balance.  Pt instructed to not complete tandem walk at hoem for safety reasons but she could perform standing tandem balance for practice with this activity.  Pt will continue to benefit from skilled physical therapy intervention to address impairments, improve QOL, and attain therapy goals.    OBJECTIVE IMPAIRMENTS: Decreased knowledge of condition, decreased use of DME, decreased mobility, difficulty walking, decreased strength, decreased ROM. ACTIVITY LIMITATIONS: Lifting, standing, walking, squatting, transfers, locomotion level PARTICIPATION LIMITATIONS: Cleaning, laundry,  interpersonal relationships, driving, yardwork, community activity.  PERSONAL FACTORS: Age, behavior pattern, education, past/current experiences, transportation, profession  are also affecting patient's functional outcome.  REHAB POTENTIAL: Good CLINICAL DECISION MAKING: Medium  EVALUATION COMPLEXITY: Moderate   PLAN:  PT FREQUENCY: 1-2x/week  PT DURATION: 8 weeks  PLANNED INTERVENTIONS: 97110-Therapeutic exercises, 97530- Therapeutic activity, [id number]- Neuromuscular re-education, 97535- Self Care, [id number]- Manual therapy, [phone]- Gait training, [phone]- Electrical  stimulation (manual), Patient/Family education, Balance training, Stair training, Joint mobilization, Joint manipulation, DME instructions, Cryotherapy, and Moist heat  PLAN FOR NEXT SESSION:  Continue with dynamic progress balance activities Continue with Dual task activities with balance Continue with progressive LE strengthening Add to HEP as appropriate.   11:06 AM, 02/12/24   Lonni KATHEE Gainer PT  Physical Therapist- [place]  [facility]

## 2024-02-16 ENCOUNTER — Encounter: Payer: Self-pay | Admitting: Nurse Practitioner

## 2024-02-16 ENCOUNTER — Ambulatory Visit (INDEPENDENT_AMBULATORY_CARE_PROVIDER_SITE_OTHER): Admitting: Nurse Practitioner

## 2024-02-16 VITALS — BP 130/68 | HR 71 | Temp 97.3°F | Resp 16 | Ht 66.0 in | Wt 142.6 lb

## 2024-02-16 DIAGNOSIS — K8070 Calculus of gallbladder and bile duct without cholecystitis without obstruction: Secondary | ICD-10-CM

## 2024-02-16 DIAGNOSIS — G4733 Obstructive sleep apnea (adult) (pediatric): Secondary | ICD-10-CM | POA: Diagnosis not present

## 2024-02-16 DIAGNOSIS — M810 Age-related osteoporosis without current pathological fracture: Secondary | ICD-10-CM | POA: Diagnosis not present

## 2024-02-16 NOTE — Progress Notes (Signed)
 [facility] [address] [place], [place] [address]  Internal MEDICINE  Office Visit Note  Patient Name: Sonya Park  [id number]  [id number]  Date of Service: 02/16/2024  Chief Complaint  Patient presents with   Follow-up    HPI  Sonya Park presents for a follow-up visit for ultrasound results, OSA on CPAP and osteoporosis.   Ultrasound -- multiple gallstones  OSA on CPAP -- needs a new CPAP machine, [facility]. She has previously had a sleep study when she was being seen by her previous PCP, Dr. Britta. Since she sees [facility], those reports should be available if needed.  Osteoporosis -- prolia stopped and recently switched to reclast annual infusion.    Current Medication: Outpatient Encounter Medications as of 02/16/2024  Medication Sig Note   acetaminophen  (TYLENOL ) 325 MG tablet Take 2 tablets (650 mg total) by mouth every 6 (six) hours as needed for mild pain (pain score 1-3) or fever.    aspirin  81 MG tablet Take 81 mg by mouth daily. Once a week (every Sunday)    Biotin (BIOTIN EXTRA STRENGTH) 10 MG CAPS Take by mouth.    calcium  citrate-vitamin D  (CITRACAL+D) 315-200 MG-UNIT tablet Take by mouth.    celecoxib (CELEBREX) 200 MG capsule Take 200 mg by mouth 2 (two) times daily.    Copper  Gluconate 2 MG CAPS Take 4 mg by mouth daily.    Cyanocobalamin  (B-12) 3000 MCG CAPS Take by mouth.    ferrous sulfate  (FEROSUL) 325 (65 FE) MG tablet Take 1 tablet (325 mg total) by mouth daily.    Melatonin 5 MG TABS Take 2.5 mg by mouth. As needed 06/16/2023: prn   Multiple Vitamins-Minerals (BARIATRIC MULTIVITAMINS/IRON PO) Take 2 capsules by mouth every morning. After breakfast.    oxyCODONE  (OXY IR/ROXICODONE ) 5 MG immediate release tablet Take 1 tablet (5 mg total) by mouth every 4 (four) hours as needed for severe pain (pain score 7-10).    pantoprazole  (PROTONIX ) 40 MG tablet Take 1 tablet (40 mg total) by mouth 2 (two) times  daily before a meal.    PREVIDENT 5000 SENSITIVE 1.1-5 % GEL Place 1 Application onto teeth as directed. 06/16/2023: prn   PROLIA 60 MG/ML SOSY injection Inject 60 mg into the skin every 6 (six) months. 06/16/2023: Patient states it's due now   spironolactone  (ALDACTONE ) 25 MG tablet Take 1 tablet (25 mg total) by mouth daily.    thiamine  100 MG tablet Take 1 tablet (100 mg total) daily by mouth.    No facility-administered encounter medications on file as of 02/16/2024.    Surgical History: Past Surgical History:  Procedure Laterality Date   ABDOMINAL HYSTERECTOMY     BREAST LUMPECTOMY Right    COLONOSCOPY WITH PROPOFOL  N/A 07/18/2015   Procedure: COLONOSCOPY WITH PROPOFOL ;  Surgeon: Rogelia Copping, MD;  Location: [facility] ENDOSCOPY;  Service: Endoscopy;  Laterality: N/A;   COSMETIC SURGERY     GASTRIC BYPASS     X2   HEMORROIDECTOMY     KNEE ARTHROPLASTY Right 12/13/2015   Procedure: COMPUTER ASSISTED TOTAL KNEE ARTHROPLASTY;  Surgeon: Lynwood SHAUNNA Hue, MD;  Location: [facility] ORS;  Service: Orthopedics;  Laterality: Right;   KNEE ARTHROSCOPY     REPLACEMENT TOTAL KNEE Left    SHOULDER SURGERY Right     Medical History: Past Medical History:  Diagnosis Date   Arthritis    Back pain    Insomnia    Medical history non-contributory  Osteoporosis     Family History: Family History  Problem Relation Age of Onset   Liver disease Mother    Heart attack Father    Cancer Father    CAD Sister     Social History   Socioeconomic History   Marital status: Married    Spouse name: [name]   Number of children: 1   Years of education: 12   Highest education level: Some college, no degree  Occupational History   Occupation: Retired  Tobacco Use   Smoking status: Former    Current packs/day: 0.00    Average packs/day: 2.0 packs/day for 29.0 years (58.0 ttl pk-yrs)    Types: Cigarettes    Start date: 11/29/1965    Quit date: 11/30/1994    Years since quitting: 29.2   Smokeless  tobacco: Never  Vaping Use   Vaping status: Never Used  Substance and Sexual Activity   Alcohol use: Not Currently    Alcohol/week: 21.0 - 35.0 standard drinks of alcohol    Types: 21 - 35 Glasses of wine per week    Comment: 3-5 glasses of wine/ day   Drug use: No   Sexual activity: Not Currently  Other Topics Concern   Not on file  Social History Narrative   Not on file   Social Drivers of Health   Financial Resource Strain: Low Risk  (10/14/2023)   Received from [facility]   Overall Financial Resource Strain (CARDIA)    Difficulty of Paying Living Expenses: Not hard at all  Food Insecurity: No Food Insecurity (10/14/2023)   Received from [facility]   Hunger Vital Sign    Within the past 12 months, you worried that your food would run out before you got the money to buy more.: Never true    Within the past 12 months, the food you bought just didn't last and you didn't have money to get more.: Never true  Transportation Needs: No Transportation Needs (10/14/2023)   Received from [facility] - Transportation    In the past 12 months, has lack of transportation kept you from medical appointments or from getting medications?: No    Lack of Transportation (Non-Medical): No  Physical Activity: Inactive (03/14/2022)   Exercise Vital Sign    Days of Exercise per Week: 0 days    Minutes of Exercise per Session: 0 min  Stress: Stress Concern Present (03/14/2022)   [employer] of Occupational [employer] - Occupational Stress Questionnaire    Feeling of Stress : To some extent  Social Connections: Moderately Isolated (06/13/2023)   Social Connection and Isolation Panel    Frequency of Communication with Friends and Family: More than three times a week    Frequency of Social Gatherings with Friends and Family: Once a week    Attends Religious Services: Never    Database Administrator or Organizations: No    Attends Tax Inspector Meetings: Never    Marital Status: Married  Catering Manager Violence: Not At Risk (06/13/2023)   Humiliation, Afraid, Rape, and Kick questionnaire    Fear of Current or Ex-Partner: No    Emotionally Abused: No    Physically Abused: No    Sexually Abused: No      Review of Systems  Constitutional:  Positive for activity change and fatigue. Negative for chills and unexpected weight change.  HENT:  Negative for congestion, postnasal drip, rhinorrhea, sneezing and sore throat.  Eyes:  Negative for redness.  Respiratory: Negative.  Negative for cough, chest tightness and shortness of breath.   Cardiovascular: Negative.  Negative for chest pain and palpitations.  Gastrointestinal: Negative.  Negative for abdominal pain, constipation, diarrhea, nausea and vomiting.  Genitourinary:  Negative for dysuria and frequency.  Musculoskeletal:  Positive for arthralgias, back pain and gait problem. Negative for joint swelling and neck pain.  Skin:  Negative for rash.  Neurological:  Positive for tremors. Negative for numbness.  Hematological:  Negative for adenopathy. Does not bruise/bleed easily.  Psychiatric/Behavioral:  Negative for behavioral problems (Depression), sleep disturbance and suicidal ideas. The patient is not nervous/anxious.     Vital Signs: BP 130/68   Pulse 71   Temp (!) 97.3 F (36.3 C)   Resp 16   Ht 5' 6 (1.676 m)   Wt 142 lb 9.6 oz (64.7 kg)   SpO2 97%   BMI 23.02 kg/m    Physical Exam Vitals reviewed.  Constitutional:      General: She is not in acute distress.    Appearance: Normal appearance. She is normal weight. She is not ill-appearing.  HENT:     Head: Normocephalic and atraumatic.  Eyes:     Pupils: Pupils are equal, round, and reactive to light.  Cardiovascular:     Rate and Rhythm: Normal rate and regular rhythm.  Pulmonary:     Effort: Pulmonary effort is normal. No respiratory distress.  Neurological:     Mental Status: She is  alert and oriented to person, place, and time.  Psychiatric:        Mood and Affect: Mood normal.        Behavior: Behavior normal.        Assessment/Plan: 1. OSA on CPAP (Primary) CPAP ordered, [facility]  - For home use only DME continuous positive airway pressure (CPAP)  2. Calculus of gallbladder and bile duct without cholecystitis or obstruction Noted, not causing any issues at this time. Patient instructed on symptoms to look out for and when to call the clinic as well as when to go to the ER.   3. Age-related osteoporosis without current pathological fracture Getting reclast infusions via endocrinology.    General Counseling: Deby verbalizes understanding of the findings of todays visit and agrees with plan of treatment. I have discussed any further diagnostic evaluation that may be needed or ordered today. We also reviewed her medications today. she has been encouraged to call the office with any questions or concerns that should arise related to todays visit.    Orders Placed This Encounter  Procedures   For home use only DME continuous positive airway pressure (CPAP)    No orders of the defined types were placed in this encounter.   Return in about 3 months (around 05/18/2024) for F/U, Hillel Card PCP.   Total time spent:30 Minutes Time spent includes review of chart, medications, test results, and follow up plan with the patient.   [place] Controlled Substance Database was reviewed by me.  This patient was seen by Mardy Maxin, FNP-C in collaboration with Dr. Sigrid Bathe as a part of collaborative care agreement.   Aubrie Lucien R. Maxin, MSN, FNP-C Internal medicine

## 2024-02-17 ENCOUNTER — Telehealth: Payer: Self-pay | Admitting: Nurse Practitioner

## 2024-02-17 ENCOUNTER — Ambulatory Visit

## 2024-02-17 DIAGNOSIS — M25632 Stiffness of left wrist, not elsewhere classified: Secondary | ICD-10-CM

## 2024-02-17 DIAGNOSIS — M6281 Muscle weakness (generalized): Secondary | ICD-10-CM

## 2024-02-17 DIAGNOSIS — R2681 Unsteadiness on feet: Secondary | ICD-10-CM

## 2024-02-17 DIAGNOSIS — R296 Repeated falls: Secondary | ICD-10-CM

## 2024-02-17 DIAGNOSIS — M25532 Pain in left wrist: Secondary | ICD-10-CM

## 2024-02-17 NOTE — Therapy (Signed)
 OUTPATIENT PHYSICAL THERAPY TREATMENT NOTE    Patient Name: Sonya Park MRN: [id number] DOB:[birth year], 83 y.o., female Today's Date: 02/17/2024  PCP: Liana Fish, NP REFERRING PROVIDER: Maree Jannett POUR, MD  END OF SESSION:  PT End of Session - 02/17/24 1201     Visit Number 15    Number of Visits 29    Date for Recertification  03/30/24    Authorization Type [employer] Medicare    Authorization Time Period 12/11/23-02/05/24    Progress Note Due on Visit 20    PT Start Time 1157    PT Stop Time 1230    PT Time Calculation (min) 33 min    Equipment Utilized During Treatment Gait belt    Activity Tolerance Patient tolerated treatment well    Behavior During Therapy WFL for tasks assessed/performed              Past Medical History:  Diagnosis Date   Arthritis    Back pain    Insomnia    Medical history non-contributory    Osteoporosis    Past Surgical History:  Procedure Laterality Date   ABDOMINAL HYSTERECTOMY     BREAST LUMPECTOMY Right    COLONOSCOPY WITH PROPOFOL  N/A 07/18/2015   Procedure: COLONOSCOPY WITH PROPOFOL ;  Surgeon: Rogelia Copping, MD;  Location: [facility];  Service: Endoscopy;  Laterality: N/A;   COSMETIC SURGERY     GASTRIC BYPASS     X2   HEMORROIDECTOMY     KNEE ARTHROPLASTY Right 12/13/2015   Procedure: COMPUTER ASSISTED TOTAL KNEE ARTHROPLASTY;  Surgeon: Lynwood SHAUNNA Hue, MD;  Location: [facility];  Service: Orthopedics;  Laterality: Right;   KNEE ARTHROSCOPY     REPLACEMENT TOTAL KNEE Left    SHOULDER SURGERY Right    Patient Active Problem List   Diagnosis Date Noted   Tremor of right hand 11/24/2023   Sacral back pain 11/24/2023   Closed fracture of left inferior pubic ramus (HCC) 06/25/2023   Closed fracture of left distal radius 06/14/2023   Closed pelvic fracture (HCC) 06/13/2023   HTN (hypertension) 06/13/2023   Chronic diastolic CHF (congestive heart failure) (HCC) 06/13/2023   Wrist fracture, closed, left, initial encounter  06/13/2023   Vitamin D  deficiency 04/12/2023   Hypocalcemia 04/12/2023   Iron deficiency anemia 04/16/2022   Closed displaced intertrochanteric fracture of right femur (HCC) 03/05/2022   Closed fracture of proximal end of right humerus 03/05/2022   Fracture of right hip (HCC) 02/03/2022   Shoulder fracture, right, closed, initial encounter 02/03/2022   Fall at home, initial encounter 02/03/2022   Neck pain 10/24/2021   Irritable bowel syndrome 10/14/2021   Obstructive sleep apnea of adult 10/14/2021   Vulvovaginitis 07/27/2021   Hypoglycemia 04/11/2021   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 01/16/2021   Vertigo 06/09/2020   Rotator cuff arthropathy of right shoulder 03/22/2020   Hyperlipidemia 02/21/2020   Need for COVID-19 vaccine 02/14/2020   Essential hypertension 09/02/2019   Seasonal allergic rhinitis due to pollen 09/02/2019   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) 11/17/2017   Lymphedema 08/11/2017   Anemia, macrocytic, nutritional    Macrocytic anemia    Dysphagia    Itching    Screening for colon cancer    LFT elevation    Severe malnutrition    Adult failure to thrive    Hyperbilirubinemia    Thrombocytopenia    Pressure injury of skin 01/19/2017  Bacteremia    Palliative care by specialist    Goals of care, counseling/discussion    Alcohol abuse 01/15/2017   Facial trauma 01/14/2017   Osteoporosis 01/14/2017   Neutropenia 01/14/2017   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   Special screening for malignant neoplasms, colon    Benign neoplasm of transverse colon    Fracture of right inferior pubic ramus (HCC) 09/17/2014   Compression fracture of L3 vertebra (HCC) 09/17/2014   Closed wedge compression fracture of third lumbar vertebra (HCC) 09/17/2014   Other specified fracture of right pubis, initial encounter for closed fracture (HCC)  09/17/2014   ONSET DATE: chronic REFERRING DIAG: falls THERAPY DIAG:  Repeated falls  Unsteadiness on feet  Muscle weakness (generalized)  Pain in left wrist  Stiffness of left wrist joint  Rationale for Evaluation and Treatment: Rehabilitation  SUBJECTIVE:                                                                                                                                                                                             SUBJECTIVE STATEMENT: Patient reports doing okay- no falls since last visit yet continues to report some unsteadiness with walking. States she feels like her left leg is lazy.   PERTINENT HISTORY:   83yoF who is referred to OPPT for imbalance and multiple falls since 2018. Most recently sustained a pelvis fracture in 2025 s/p fall and Rt hip fracture s/p fall 2024. 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 Pt is currently back to her baseline which includes ad lib AMB without device, however she has not tolerated community distances in years due to chronic low back pain, is more recently limited by SOB and chest tightness with walking longer distances. Pt is fairly active at home, plans to leave here after evaluation visit and help her husband finish putting out more mulch at the house.   PAIN:  Are you having pain? no pain at rest.    PRECAUTIONS: falls  WEIGHT BEARING RESTRICTIONS: None  FALLS: Has patient fallen in last 6 months? Yes   LIVING ENVIRONMENT: Lives with: husband  Lives in: 2 level house, lives on main level  Stairs: 2 separate steps Has following equipment at home: WC, 5 canes, walker  PLOF: has recently rehabbed back to walking without a cane  PATIENT GOALS: stop falling   OBJECTIVE:  TREATMENT DATE 02/17/24 :  NMR: To facilitate reeducation of movement, balance, posture, coordination, and/or  proprioception/kinesthetic sense.  -Step tap 3# AW onto 4 step without UE at support bar x 25 reps  (VC to look ahead and for step height) - Patient rates as hard and demo several   mis-steps that required close CGA  -Tandem  static standing - hold 30 sec x 3 each LE- more difficulty with LLE in back position.  -Dynamic march from airex tapping step 2 x 15 reps alt LE (VC for step height)    -Static standing with 1 LE on airex other on 4 step 3 x 30 sec then switch Legs- 3 sets each- all with eyes open. (Observed initial unsteadiness  improved each set)                  Therapeutic Activities: dynamic therapeutic activities esigned to achieve improved functional performance -Fwd step up 3# AW onto 4 step without UE x 15 alt LE                -Resistive gait with 3# AW each LE in clinic  x approx 200 feet (VC for quiet left foot- tendency to scuff shoe on left- yet improved with concentration)   -Sit to stand while holding onto 5kg ball x 12 reps (VC for anterior weight shift)   -Resistive gait 3# AW x 175 feet in clinic  (less VC for step length and foot clearance)    Unless otherwise stated, CGA was provided and gait belt donned in order to ensure pt safety   PATIENT EDUCATION: Education details: repeat explanation of vallet parking and volunteer services offering; explained how Rt hip instability is causing Left toe catching issues;  Person educated: Patient Education method: discussion  Education comprehension: Fair (some memory retention difficulty)   HOME EXERCISE PROGRAM:  Access Code: [email or web address] URL: [email or web address] Date: 02/10/2024 Prepared by: Reyes London  Exercises - Seated Assisted Cervical Rotation with Towel  - 1 x daily - 3 sets - 10 reps   GOALS: Goals reviewed with patient? Yes   SHORT TERM GOALS: Target date: 03/02/2024   Pt to demonstrate improved cervical rotation ROM right side >40 degrees to improve safety with scanning  driving and reduce need for full body rotation during household activity.  Baseline: eval: 30 degrees Rt rotation. 10/28: 28 degrees lateral rotation to the R  Goal status: NOT MET  2.  Pt to demonstrate 5xSTS in <11.5sec hands free and without LOB to demonstrate improve BLE power.  Baseline: 01/27/24: 11.16sec; 9.70sec Goal status: MET  3.  Pt will demonstrate ability to hold tandem stance balance > 20sec bilat to demonstrate improved hip strength/proprioception for balance.  Baseline: 01/27/24: ~5sec each way 10/28: 60 sec ea LE Goal status: MET  LONG TERM GOALS: Target date:03/30/2024   Pt to improve Berg Balance Test score to >52 to indicate reduced risk of falls.  Baseline: 45 Goal status: MET  2.  Pt to demonstrate 5x STS <10.4sec hands free and without LOB to show improved power in BLE.  Baseline: 01/27/24: 11.16sec; 9.70sec Goal status: MET  3.  Pt to demonstrate ability to perform >60sec tandem stance bilat.  Baseline: <5secH 01/27/24: ~5secH 10/28: 60 sec ea LE, more difficulty / sway with R LE posterior  Goal status: MET  4.  Pt to demonstrate improved Right cervical rotation ROM >46 degrees to improve safety with visual scanning in car and while working in yard.  Baseline:  30 degrees 10/28: 28 degrees lateral rotation to the R  Goal status: NOT MET  5.  Pt will improve FGA by 3 points or greater in order to indicate improved dynamic balance and decreased fall risk.  Baseline: 20 Goal status: INITIAL     ASSESSMENT:  CLINICAL IMPRESSION:  Treatment limited again due to late arrival. Patient exhibited improving overall foot clearance with practice today- less scuffing L shoe. She was able to hold tandem stance better overall today with more steadiness - more difficulty with LEFT LE in rear position. She will continue to benefit from progressive overall dynamic balance and overall LE strengthening to promote optimal safety and decrease risk of falling. Pt will  continue to benefit from skilled physical therapy intervention to address impairments, improve QOL, and attain therapy goals.    OBJECTIVE IMPAIRMENTS: Decreased knowledge of condition, decreased use of DME, decreased mobility, difficulty walking, decreased strength, decreased ROM. ACTIVITY LIMITATIONS: Lifting, standing, walking, squatting, transfers, locomotion level PARTICIPATION LIMITATIONS: Cleaning, laundry, interpersonal relationships, driving, yardwork, community activity.  PERSONAL FACTORS: Age, behavior pattern, education, past/current experiences, transportation, profession  are also affecting patient's functional outcome.  REHAB POTENTIAL: Good CLINICAL DECISION MAKING: Medium  EVALUATION COMPLEXITY: Moderate   PLAN:  PT FREQUENCY: 1-2x/week  PT DURATION: 8 weeks  PLANNED INTERVENTIONS: 97110-Therapeutic exercises, 97530- Therapeutic activity, [id number]- Neuromuscular re-education, 97535- Self Care, [id number]- Manual therapy, [phone]- Gait training, [phone]- Electrical stimulation (manual), Patient/Family education, Balance training, Stair training, Joint mobilization, Joint manipulation, DME instructions, Cryotherapy, and Moist heat  PLAN FOR NEXT SESSION:  Continue with dynamic progress balance activities Continue with Dual task activities with balance Continue with progressive LE strengthening Add to HEP as appropriate.   4:04 PM, 02/17/24   Reyes LOISE London PT  Physical Therapist- [place]  [facility]

## 2024-02-17 NOTE — Telephone Encounter (Signed)
 Awaiting 02/16/24 office notes for cpap replacement order-Toni

## 2024-02-19 ENCOUNTER — Ambulatory Visit

## 2024-02-19 DIAGNOSIS — R2681 Unsteadiness on feet: Secondary | ICD-10-CM

## 2024-02-19 DIAGNOSIS — M6281 Muscle weakness (generalized): Secondary | ICD-10-CM

## 2024-02-19 DIAGNOSIS — R296 Repeated falls: Secondary | ICD-10-CM

## 2024-02-19 NOTE — Therapy (Signed)
 OUTPATIENT PHYSICAL THERAPY TREATMENT NOTE    Patient Name: Sonya Park MRN: [id number] DOB:[birth year], 83 y.o., female Today's Date: 02/19/2024  PCP: Liana Fish, NP REFERRING PROVIDER: Maree Jannett POUR, MD  END OF SESSION:  PT End of Session - 02/19/24 1148     Visit Number 16    Number of Visits 29    Date for Recertification  03/30/24    Authorization Type [employer] Medicare    Authorization Time Period 12/11/23-02/05/24    Progress Note Due on Visit 20    PT Start Time 1148    PT Stop Time 1230    PT Time Calculation (min) 42 min    Equipment Utilized During Treatment Gait belt    Activity Tolerance Patient tolerated treatment well    Behavior During Therapy WFL for tasks assessed/performed              Past Medical History:  Diagnosis Date   Arthritis    Back pain    Insomnia    Medical history non-contributory    Osteoporosis    Past Surgical History:  Procedure Laterality Date   ABDOMINAL HYSTERECTOMY     BREAST LUMPECTOMY Right    COLONOSCOPY WITH PROPOFOL  N/A 07/18/2015   Procedure: COLONOSCOPY WITH PROPOFOL ;  Surgeon: Rogelia Copping, MD;  Location: [facility];  Service: Endoscopy;  Laterality: N/A;   COSMETIC SURGERY     GASTRIC BYPASS     X2   HEMORROIDECTOMY     KNEE ARTHROPLASTY Right 12/13/2015   Procedure: COMPUTER ASSISTED TOTAL KNEE ARTHROPLASTY;  Surgeon: Lynwood SHAUNNA Hue, MD;  Location: [facility];  Service: Orthopedics;  Laterality: Right;   KNEE ARTHROSCOPY     REPLACEMENT TOTAL KNEE Left    SHOULDER SURGERY Right    Patient Active Problem List   Diagnosis Date Noted   Tremor of right hand 11/24/2023   Sacral back pain 11/24/2023   Closed fracture of left inferior pubic ramus (HCC) 06/25/2023   Closed fracture of left distal radius 06/14/2023   Closed pelvic fracture (HCC) 06/13/2023   HTN (hypertension) 06/13/2023   Chronic diastolic CHF (congestive heart failure) (HCC) 06/13/2023   Wrist fracture, closed, left, initial encounter  06/13/2023   Vitamin D  deficiency 04/12/2023   Hypocalcemia 04/12/2023   Iron deficiency anemia 04/16/2022   Closed displaced intertrochanteric fracture of right femur (HCC) 03/05/2022   Closed fracture of proximal end of right humerus 03/05/2022   Fracture of right hip (HCC) 02/03/2022   Shoulder fracture, right, closed, initial encounter 02/03/2022   Fall at home, initial encounter 02/03/2022   Neck pain 10/24/2021   Irritable bowel syndrome 10/14/2021   Obstructive sleep apnea of adult 10/14/2021   Vulvovaginitis 07/27/2021   Hypoglycemia 04/11/2021   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 01/16/2021   Vertigo 06/09/2020   Rotator cuff arthropathy of right shoulder 03/22/2020   Hyperlipidemia 02/21/2020   Need for COVID-19 vaccine 02/14/2020   Essential hypertension 09/02/2019   Seasonal allergic rhinitis due to pollen 09/02/2019   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) 11/17/2017   Lymphedema 08/11/2017   Anemia, macrocytic, nutritional    Macrocytic anemia    Dysphagia    Itching    Screening for colon cancer    LFT elevation    Severe malnutrition    Adult failure to thrive    Hyperbilirubinemia    Thrombocytopenia    Pressure injury of skin 01/19/2017  Bacteremia    Palliative care by specialist    Goals of care, counseling/discussion    Alcohol abuse 01/15/2017   Facial trauma 01/14/2017   Osteoporosis 01/14/2017   Neutropenia 01/14/2017   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   Special screening for malignant neoplasms, colon    Benign neoplasm of transverse colon    Fracture of right inferior pubic ramus (HCC) 09/17/2014   Compression fracture of L3 vertebra (HCC) 09/17/2014   Closed wedge compression fracture of third lumbar vertebra (HCC) 09/17/2014   Other specified fracture of right pubis, initial encounter for closed fracture (HCC)  09/17/2014   ONSET DATE: chronic REFERRING DIAG: falls THERAPY DIAG:  Repeated falls  Unsteadiness on feet  Muscle weakness (generalized)  Rationale for Evaluation and Treatment: Rehabilitation  SUBJECTIVE:                                                                                                                                                                                             SUBJECTIVE STATEMENT: Patient reports doing okay- having some left thumb cramping today. States she has been busy moving stuff in her garage.   PERTINENT HISTORY:   83yoF who is referred to OPPT for imbalance and multiple falls since 2018. Most recently sustained a pelvis fracture in 2025 s/p fall and Rt hip fracture s/p fall 2024. 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 Pt is currently back to her baseline which includes ad lib AMB without device, however she has not tolerated community distances in years due to chronic low back pain, is more recently limited by SOB and chest tightness with walking longer distances. Pt is fairly active at home, plans to leave here after evaluation visit and help her husband finish putting out more mulch at the house.   PAIN:  Are you having pain? no pain at rest.    PRECAUTIONS: falls  WEIGHT BEARING RESTRICTIONS: None  FALLS: Has patient fallen in last 6 months? Yes   LIVING ENVIRONMENT: Lives with: husband  Lives in: 2 level house, lives on main level  Stairs: 2 separate steps Has following equipment at home: WC, 5 canes, walker  PLOF: has recently rehabbed back to walking without a cane  PATIENT GOALS: stop falling   OBJECTIVE:  TREATMENT DATE 02/19/24 :  NMR: To facilitate reeducation of movement, balance, posture, coordination, and/or proprioception/kinesthetic sense.   -Heel walk along 10 foot distance 3# AW - down and back x4  (some difficulty maintaining heels off floor)   -Toe walk along 10 foot distance 3#AW- down and back x 4 (patient reports as hard) but able to keep toes up while walking  -Step tap 3# AW onto 4 step without UE at support bar x 25 reps  (VC to look ahead and for step height)  -obstacle course- step up/over purple pad then up/over 4 step,  up/over shoe box, zig/zag around 3 cones- near tandem on The step then fwd up/over 1/2 foam roll x 2 then up/over orange hurdle- x 3 rds then added a 5Kg ball she held onto for dual task x 3 more rounds. (Also added side stepping on last 2 rds)                 Therapeutic Activities: dynamic therapeutic activities esigned to achieve improved functional performance                  -Resistive gait with 3# AW each LE in clinic  x 10 feet fwd and 10 feet backward - x 6 focusing on step length and foot clearance concentration)   Side step + squat along 10 foot path- going to left then back to Right 3# AW x 3 rounds.   -Sit to stand while holding onto 5kg ball 2 x 10 reps (VC for anterior weight shift)   -Resistive gait 3# AW x 320 feet in clinic  (less VC for step length and foot clearance overall- )    Unless otherwise stated, CGA was provided and gait belt donned in order to ensure pt safety   PATIENT EDUCATION: Education details: repeat explanation of vallet parking and volunteer services offering; explained how Rt hip instability is causing Left toe catching issues;  Person educated: Patient Education method: discussion  Education comprehension: Fair (some memory retention difficulty)   HOME EXERCISE PROGRAM:  Access Code: [email or web address] URL: [email or web address] Date: 02/10/2024 Prepared by: Reyes London  Exercises - Seated Assisted Cervical Rotation with Towel  - 1 x daily - 3 sets - 10 reps   GOALS: Goals reviewed with patient? Yes   SHORT TERM GOALS: Target date: 03/02/2024   Pt to demonstrate improved cervical  rotation ROM right side >40 degrees to improve safety with scanning driving and reduce need for full body rotation during household activity.  Baseline: eval: 30 degrees Rt rotation. 10/28: 28 degrees lateral rotation to the R  Goal status: NOT MET  2.  Pt to demonstrate 5xSTS in <11.5sec hands free and without LOB to demonstrate improve BLE power.  Baseline: 01/27/24: 11.16sec; 9.70sec Goal status: MET  3.  Pt will demonstrate ability to hold tandem stance balance > 20sec bilat to demonstrate improved hip strength/proprioception for balance.  Baseline: 01/27/24: ~5sec each way 10/28: 60 sec ea LE Goal status: MET  LONG TERM GOALS: Target date:03/30/2024   Pt to improve Berg Balance Test score to >52 to indicate reduced risk of falls.  Baseline: 45 Goal status: MET  2.  Pt to demonstrate 5x STS <10.4sec hands free and without LOB to show improved power in BLE.  Baseline: 01/27/24: 11.16sec; 9.70sec Goal status: MET  3.  Pt to demonstrate ability to perform >60sec tandem stance bilat.  Baseline: <5secH 01/27/24: ~5secH 10/28: 60 sec ea LE, more difficulty / sway with R  LE posterior  Goal status: MET  4.  Pt to demonstrate improved Right cervical rotation ROM >46 degrees to improve safety with visual scanning in car and while working in yard.  Baseline: 30 degrees 10/28: 28 degrees lateral rotation to the R  Goal status: NOT MET  5.  Pt will improve FGA by 3 points or greater in order to indicate improved dynamic balance and decreased fall risk.  Baseline: 20 Goal status: INITIAL     ASSESSMENT:  CLINICAL IMPRESSION:  Patient presents with improving left foot clearance with activities with ankle resistance today. She exhibited less audible scuffing of shoe today and concentrated hard with all activities. She was fatigued with walking and sit to stand transfers with 5kg ball. No evidence of LOB with obstacle course today and good ability to adapt to all activities. Pt will  continue to benefit from skilled physical therapy intervention to address impairments, improve QOL, and attain therapy goals.    OBJECTIVE IMPAIRMENTS: Decreased knowledge of condition, decreased use of DME, decreased mobility, difficulty walking, decreased strength, decreased ROM. ACTIVITY LIMITATIONS: Lifting, standing, walking, squatting, transfers, locomotion level PARTICIPATION LIMITATIONS: Cleaning, laundry, interpersonal relationships, driving, yardwork, community activity.  PERSONAL FACTORS: Age, behavior pattern, education, past/current experiences, transportation, profession  are also affecting patient's functional outcome.  REHAB POTENTIAL: Good CLINICAL DECISION MAKING: Medium  EVALUATION COMPLEXITY: Moderate   PLAN:  PT FREQUENCY: 1-2x/week  PT DURATION: 8 weeks  PLANNED INTERVENTIONS: 97110-Therapeutic exercises, 97530- Therapeutic activity, [id number]- Neuromuscular re-education, 97535- Self Care, 02859- Manual therapy, [phone]- Gait training, [phone]- Electrical stimulation (manual), Patient/Family education, Balance training, Stair training, Joint mobilization, Joint manipulation, DME instructions, Cryotherapy, and Moist heat  PLAN FOR NEXT SESSION:  Continue with dynamic progress balance activities Continue with Dual task activities with balance Continue with progressive LE strengthening Add to HEP as appropriate.   1:10 PM, 02/19/24   Reyes LOISE London PT  Physical Therapist- [place]  [facility]

## 2024-02-24 ENCOUNTER — Ambulatory Visit

## 2024-02-24 DIAGNOSIS — R2681 Unsteadiness on feet: Secondary | ICD-10-CM

## 2024-02-24 DIAGNOSIS — R296 Repeated falls: Secondary | ICD-10-CM | POA: Diagnosis not present

## 2024-02-24 DIAGNOSIS — M6281 Muscle weakness (generalized): Secondary | ICD-10-CM

## 2024-02-24 NOTE — Therapy (Signed)
 OUTPATIENT PHYSICAL THERAPY TREATMENT   Patient Name: Sonya Park MRN: [id number] DOB:1940/12/04, 83 y.o., female Today's Date: 02/24/2024  PCP: Liana Fish, NP REFERRING PROVIDER: Maree Jannett POUR, MD  END OF SESSION:  PT End of Session - 02/24/24 1157     Visit Number 17    Number of Visits 29    Date for Recertification  03/30/24    Authorization Type [employer]    Authorization Time Period Recert: 02/03/24-03/30/24    Authorization - Visit Number 5    Authorization - Number of Visits 8    Progress Note Due on Visit 20    PT Start Time 1150   late arrival   PT Stop Time 1225    PT Time Calculation (min) 35 min    Equipment Utilized During Treatment Gait belt    Activity Tolerance Patient tolerated treatment well;No increased pain    Behavior During Therapy WFL for tasks assessed/performed              Past Medical History:  Diagnosis Date   Arthritis    Back pain    Insomnia    Medical history non-contributory    Osteoporosis    Past Surgical History:  Procedure Laterality Date   ABDOMINAL HYSTERECTOMY     BREAST LUMPECTOMY Right    COLONOSCOPY WITH PROPOFOL  N/A 07/18/2015   Procedure: COLONOSCOPY WITH PROPOFOL ;  Surgeon: Rogelia Copping, MD;  Location: [facility];  Service: Endoscopy;  Laterality: N/A;   COSMETIC SURGERY     GASTRIC BYPASS     X2   HEMORROIDECTOMY     KNEE ARTHROPLASTY Right 12/13/2015   Procedure: COMPUTER ASSISTED TOTAL KNEE ARTHROPLASTY;  Surgeon: Lynwood SHAUNNA Hue, MD;  Location: [facility];  Service: Orthopedics;  Laterality: Right;   KNEE ARTHROSCOPY     REPLACEMENT TOTAL KNEE Left    SHOULDER SURGERY Right    Patient Active Problem List   Diagnosis Date Noted   Tremor of right hand 11/24/2023   Sacral back pain 11/24/2023   Closed fracture of left inferior pubic ramus (HCC) 06/25/2023   Closed fracture of left distal radius 06/14/2023   Closed pelvic fracture (HCC) 06/13/2023   HTN (hypertension) 06/13/2023   Chronic  diastolic CHF (congestive heart failure) (HCC) 06/13/2023   Wrist fracture, closed, left, initial encounter 06/13/2023   Vitamin D  deficiency 04/12/2023   Hypocalcemia 04/12/2023   Iron deficiency anemia 04/16/2022   Closed displaced intertrochanteric fracture of right femur (HCC) 03/05/2022   Closed fracture of proximal end of right humerus 03/05/2022   Fracture of right hip (HCC) 02/03/2022   Shoulder fracture, right, closed, initial encounter 02/03/2022   Fall at home, initial encounter 02/03/2022   Neck pain 10/24/2021   Irritable bowel syndrome 10/14/2021   Obstructive sleep apnea of adult 10/14/2021   Vulvovaginitis 07/27/2021   Hypoglycemia 04/11/2021   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 01/16/2021   Vertigo 06/09/2020   Rotator cuff arthropathy of right shoulder 03/22/2020   Hyperlipidemia 02/21/2020   Need for COVID-19 vaccine 02/14/2020   Essential hypertension 09/02/2019   Seasonal allergic rhinitis due to pollen 09/02/2019   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) 11/17/2017   Lymphedema 08/11/2017   Anemia, macrocytic, nutritional    Macrocytic anemia    Dysphagia    Itching    Screening for colon cancer    LFT elevation    Severe malnutrition  Adult failure to thrive    Hyperbilirubinemia    Thrombocytopenia    Pressure injury of skin 01/19/2017   Bacteremia    Palliative care by specialist    Goals of care, counseling/discussion    Alcohol abuse 01/15/2017   Facial trauma 01/14/2017   Osteoporosis 01/14/2017   Neutropenia 01/14/2017   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   Special screening for malignant neoplasms, colon    Benign neoplasm of transverse colon    Fracture of right inferior pubic ramus (HCC) 09/17/2014   Compression fracture of L3 vertebra (HCC) 09/17/2014   Closed wedge compression fracture of third lumbar vertebra (HCC)  09/17/2014   Other specified fracture of right pubis, initial encounter for closed fracture (HCC) 09/17/2014   ONSET DATE: chronic REFERRING DIAG: falls THERAPY DIAG:  Repeated falls  Unsteadiness on feet  Muscle weakness (generalized)  Rationale for Evaluation and Treatment: Rehabilitation  SUBJECTIVE:                                                                                                                                                                                             SUBJECTIVE STATEMENT: Patient reports doing okay- Pt running late today, not clear why.    PERTINENT HISTORY:   83yoF who is referred to OPPT for imbalance and multiple falls since 2018. Most recently sustained a pelvis fracture in 2025 s/p fall and Rt hip fracture s/p fall 2024. 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 Pt is currently back to her baseline which includes ad lib AMB without device, however she has not tolerated community distances in years due to chronic low back pain, is more recently limited by SOB and chest tightness with walking longer distances. Pt is fairly active at home, plans to leave here after evaluation visit and help her husband finish putting out more mulch at the house.   PAIN:  Are you having pain? no pain at rest    PRECAUTIONS: falls  WEIGHT BEARING RESTRICTIONS: None  FALLS: Has patient fallen in last 6 months? Yes   LIVING ENVIRONMENT: Lives with: husband  Lives in: 2 level house, lives on main level  Stairs: 2 separate steps Has following equipment at home: WC, 5 canes, walker  PLOF: has recently rehabbed back to walking without a cane  PATIENT GOALS: stop falling   OBJECTIVE:  TREATMENT DATE 02/24/24: -lateral stepping in // bars c 5lb AW 5x bilat  *seated rest  -lateral stepping in // bars c 5lb AW 5x bilat  *seated rest  -side  stepping 3x bilat in // bars with cable resistance 7.5lb *seated rest  -alternate forward and backward AMB in // bars with PtoA cable resistance 7.5lb  *seated rest  -alternate forward and backward AMB in // bars with PtoA cable resistance 7.5lb   PATIENT EDUCATION: Education details: repeat explanation of [employer] parking and volunteer services offering; explained how Rt hip instability is causing Left toe catching issues;  Person educated: Patient Education method: discussion  Education comprehension: Fair (some memory retention difficulty)   HOME EXERCISE PROGRAM:  Access Code: [email or web address] URL: [email or web address] Date: 02/10/2024 Prepared by: Reyes London  Exercises - Seated Assisted Cervical Rotation with Towel  - 1 x daily - 3 sets - 10 reps   GOALS: Goals reviewed with patient? Yes   SHORT TERM GOALS: Target date: 03/02/2024  Pt to demonstrate improved cervical rotation ROM right side >40 degrees to improve safety with scanning driving and reduce need for full body rotation during household activity.  Baseline: eval: 30 degrees Rt rotation. 10/28: 28 degrees lateral rotation to the R  Goal status: NOT MET  2.  Pt to demonstrate 5xSTS in <11.5sec hands free and without LOB to demonstrate improve BLE power.  Baseline: 01/27/24: 11.16sec; 9.70sec Goal status: MET  3.  Pt will demonstrate ability to hold tandem stance balance > 20sec bilat to demonstrate improved hip strength/proprioception for balance.  Baseline: 01/27/24: ~5sec each way 10/28: 60 sec ea LE Goal status: MET  LONG TERM GOALS: Target date:03/30/2024  Pt to improve Berg Balance Test score to >52 to indicate reduced risk of falls.  Baseline: 45 Goal status: MET  2.  Pt to demonstrate 5x STS <10.4sec hands free and without LOB to show improved power in BLE.  Baseline: 01/27/24: 11.16sec; 9.70sec Goal status: MET  3.  Pt to demonstrate ability to perform >60sec tandem stance bilat.   Baseline: <5secH 01/27/24: ~5secH 10/28: 60 sec ea LE, more difficulty / sway with R LE posterior  Goal status: MET  4.  Pt to demonstrate improved Right cervical rotation ROM >46 degrees to improve safety with visual scanning in car and while working in yard.  Baseline: 30 degrees 10/28: 28 degrees lateral rotation to the R  Goal status: NOT MET  5.  Pt will improve FGA by 3 points or greater in order to indicate improved dynamic balance and decreased fall risk.  Baseline: 20 Goal status: INITIAL  ASSESSMENT:  CLINICAL IMPRESSION: Continued with hip strengthing, postural control and righting. Pt is fatigued, has more LOB after onset fatigue. Breaks taken ad lib. Pt significantly challenged with cable resisted walking, required freqieunt 1 hand support for balance. Pt has 3 remaining visits approved with current cert, then will plan to DC. Pt will continue to benefit from skilled physical therapy intervention to address impairments, improve QOL, and attain therapy goals.    OBJECTIVE IMPAIRMENTS: Decreased knowledge of condition, decreased use of DME, decreased mobility, difficulty walking, decreased strength, decreased ROM. ACTIVITY LIMITATIONS: Lifting, standing, walking, squatting, transfers, locomotion level PARTICIPATION LIMITATIONS: Cleaning, laundry, interpersonal relationships, driving, yardwork, community activity.  PERSONAL FACTORS: Age, behavior pattern, education, past/current experiences, transportation, profession  are also affecting patient's functional outcome.  REHAB POTENTIAL: Good CLINICAL DECISION MAKING: Medium  EVALUATION COMPLEXITY: Moderate   PLAN:  PT FREQUENCY: 1-2x/week  PT DURATION: 8 weeks  PLANNED INTERVENTIONS: 97110-Therapeutic exercises, 97530- Therapeutic activity, [id number]- Neuromuscular re-education, 97535- Self Care, 02859- Manual therapy, [phone]- Gait training, [phone]- Electrical stimulation (manual), Patient/Family education, Balance training, Stair  training, Joint mobilization, Joint manipulation, DME instructions, Cryotherapy, and Moist heat  PLAN FOR NEXT SESSION:  Continue with dynamic progress balance activities Continue with Dual task activities with balance Continue with progressive LE strengthening Add to HEP as appropriate.   12:26 PM, 02/24/24 Peggye JAYSON Linear, PT, DPT Physical Therapist - [facility]  Outpatient Physical Therapy- Main [facility] [phone]

## 2024-02-26 ENCOUNTER — Ambulatory Visit

## 2024-02-26 DIAGNOSIS — M6281 Muscle weakness (generalized): Secondary | ICD-10-CM

## 2024-02-26 DIAGNOSIS — R2681 Unsteadiness on feet: Secondary | ICD-10-CM

## 2024-02-26 DIAGNOSIS — R296 Repeated falls: Secondary | ICD-10-CM | POA: Diagnosis not present

## 2024-02-26 NOTE — Therapy (Signed)
 OUTPATIENT PHYSICAL THERAPY TREATMENT   Patient Name: Sonya Park MRN: [id number] DOB:06/23/1940, 83 y.o., female Today's Date: 02/26/2024  PCP: Liana Fish, NP REFERRING PROVIDER: Maree Jannett POUR, MD  END OF SESSION:  PT End of Session - 02/26/24 1102     Visit Number 18    Number of Visits 29    Date for Recertification  03/30/24    Authorization Type [employer] Medicare    Authorization Time Period Recert: 02/03/24-03/30/24    Authorization - Number of Visits 8    Progress Note Due on Visit 20    PT Start Time 1102    PT Stop Time 1144    PT Time Calculation (min) 42 min    Equipment Utilized During Treatment Gait belt    Activity Tolerance Patient tolerated treatment well;No increased pain    Behavior During Therapy WFL for tasks assessed/performed               Past Medical History:  Diagnosis Date   Arthritis    Back pain    Insomnia    Medical history non-contributory    Osteoporosis    Past Surgical History:  Procedure Laterality Date   ABDOMINAL HYSTERECTOMY     BREAST LUMPECTOMY Right    COLONOSCOPY WITH PROPOFOL  N/A 07/18/2015   Procedure: COLONOSCOPY WITH PROPOFOL ;  Surgeon: Rogelia Copping, MD;  Location: [facility];  Service: Endoscopy;  Laterality: N/A;   COSMETIC SURGERY     GASTRIC BYPASS     X2   HEMORROIDECTOMY     KNEE ARTHROPLASTY Right 12/13/2015   Procedure: COMPUTER ASSISTED TOTAL KNEE ARTHROPLASTY;  Surgeon: Lynwood SHAUNNA Hue, MD;  Location: [facility] OR[facility];  Service: Orthopedics;  Laterality: Right;   KNEE ARTHROSCOPY     REPLACEMENT TOTAL KNEE Left    SHOULDER SURGERY Right    Patient Active Problem List   Diagnosis Date Noted   Tremor of right hand 11/24/2023   Sacral back pain 11/24/2023   Closed fracture of left inferior pubic ramus (HCC) 06/25/2023   Closed fracture of left distal radius 06/14/2023   Closed pelvic fracture (HCC) 06/13/2023   HTN (hypertension) 06/13/2023   Chronic diastolic CHF (congestive heart failure) (HCC)  06/13/2023   Wrist fracture, closed, left, initial encounter 06/13/2023   Vitamin D  deficiency 04/12/2023   Hypocalcemia 04/12/2023   Iron deficiency anemia 04/16/2022   Closed displaced intertrochanteric fracture of right femur (HCC) 03/05/2022   Closed fracture of proximal end of right humerus 03/05/2022   Fracture of right hip (HCC) 02/03/2022   Shoulder fracture, right, closed, initial encounter 02/03/2022   Fall at home, initial encounter 02/03/2022   Neck pain 10/24/2021   Irritable bowel syndrome 10/14/2021   Obstructive sleep apnea of adult 10/14/2021   Vulvovaginitis 07/27/2021   Hypoglycemia 04/11/2021   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 01/16/2021   Vertigo 06/09/2020   Rotator cuff arthropathy of right shoulder 03/22/2020   Hyperlipidemia 02/21/2020   Need for COVID-19 vaccine 02/14/2020   Essential hypertension 09/02/2019   Seasonal allergic rhinitis due to pollen 09/02/2019   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) 11/17/2017   Lymphedema 08/11/2017   Anemia, macrocytic, nutritional    Macrocytic anemia    Dysphagia    Itching    Screening for colon cancer    LFT elevation    Severe malnutrition    Adult failure to thrive    Hyperbilirubinemia  Thrombocytopenia    Pressure injury of skin 01/19/2017   Bacteremia    Palliative care by specialist    Goals of care, counseling/discussion    Alcohol abuse 01/15/2017   Facial trauma 01/14/2017   Osteoporosis 01/14/2017   Neutropenia 01/14/2017   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   Special screening for malignant neoplasms, colon    Benign neoplasm of transverse colon    Fracture of right inferior pubic ramus (HCC) 09/17/2014   Compression fracture of L3 vertebra (HCC) 09/17/2014   Closed wedge compression fracture of third lumbar vertebra (HCC) 09/17/2014   Other specified fracture of  right pubis, initial encounter for closed fracture (HCC) 09/17/2014   ONSET DATE: chronic REFERRING DIAG: falls THERAPY DIAG:  Repeated falls  Unsteadiness on feet  Muscle weakness (generalized)  Rationale for Evaluation and Treatment: Rehabilitation  SUBJECTIVE:                                                                                                                                                                                             SUBJECTIVE STATEMENT: Patient reports sore toe on left side and loosened shoe. Reports she feels like she needs to work on her heel to toe and backward walking.   PERTINENT HISTORY:   83yoF who is referred to OPPT for imbalance and multiple falls since 2018. Most recently sustained a pelvis fracture in 2025 s/p fall and Rt hip fracture s/p fall 2024. 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 Pt is currently back to her baseline which includes ad lib AMB without device, however she has not tolerated community distances in years due to chronic low back pain, is more recently limited by SOB and chest tightness with walking longer distances. Pt is fairly active at home, plans to leave here after evaluation visit and help her husband finish putting out more mulch at the house.   PAIN:  Are you having pain? no pain at rest    PRECAUTIONS: falls  WEIGHT BEARING RESTRICTIONS: None  FALLS: Has patient fallen in last 6 months? Yes   LIVING ENVIRONMENT: Lives with: husband  Lives in: 2 level house, lives on main level  Stairs: 2 separate steps Has following equipment at home: WC, 5 canes, walker  PLOF: has recently rehabbed back to walking without a cane  PATIENT GOALS: stop falling   OBJECTIVE:  TREATMENT DATE 02/26/24:  TA: -Gait with 5# x 300 feet - VC to pick up feet with increased distance  -Lateral stepping along 10 feet  line with  5lb AW each LE  L-R x 5 eac -Tandem stepping along 10 feet line with 5lb AW each LE x 5- down/back -Bwd Walking 5lb AW each LE -Sit to stand without UE support x 10 rep -Gait with 5# x 300 feet - VC to pick up feet with increased distance -Dual task- arranging magnet letters in colored order on dry erase board in varying foot positions- tandem on firm, wide on airex, narrow on airex, staggered on airex  -SLS-hold on left up to 7 sec and on R up to 20 sec x multiple trials each      PATIENT EDUCATION: Education details: repeat explanation of [employer] parking and volunteer services offering; explained how Rt hip instability is causing Left toe catching issues;  Person educated: Patient Education method: discussion  Education comprehension: Fair (some memory retention difficulty)   HOME EXERCISE PROGRAM:  Access Code: [email or web address] URL: [email or web address] Date: 02/10/2024 Prepared by: Reyes London  Exercises - Seated Assisted Cervical Rotation with Towel  - 1 x daily - 3 sets - 10 reps   GOALS: Goals reviewed with patient? Yes   SHORT TERM GOALS: Target date: 03/02/2024  Pt to demonstrate improved cervical rotation ROM right side >40 degrees to improve safety with scanning driving and reduce need for full body rotation during household activity.  Baseline: eval: 30 degrees Rt rotation. 10/28: 28 degrees lateral rotation to the R  Goal status: NOT MET  2.  Pt to demonstrate 5xSTS in <11.5sec hands free and without LOB to demonstrate improve BLE power.  Baseline: 01/27/24: 11.16sec; 9.70sec Goal status: MET  3.  Pt will demonstrate ability to hold tandem stance balance > 20sec bilat to demonstrate improved hip strength/proprioception for balance.  Baseline: 01/27/24: ~5sec each way 10/28: 60 sec ea LE Goal status: MET  LONG TERM GOALS: Target date:03/30/2024  Pt to improve Berg Balance Test score to >52 to indicate reduced risk of falls.  Baseline:  45 Goal status: MET  2.  Pt to demonstrate 5x STS <10.4sec hands free and without LOB to show improved power in BLE.  Baseline: 01/27/24: 11.16sec; 9.70sec Goal status: MET  3.  Pt to demonstrate ability to perform >60sec tandem stance bilat.  Baseline: <5secH 01/27/24: ~5secH 10/28: 60 sec ea LE, more difficulty / sway with R LE posterior  Goal status: MET  4.  Pt to demonstrate improved Right cervical rotation ROM >46 degrees to improve safety with visual scanning in car and while working in yard.  Baseline: 30 degrees 10/28: 28 degrees lateral rotation to the R  Goal status: NOT MET  5.  Pt will improve FGA by 3 points or greater in order to indicate improved dynamic balance and decreased fall risk.  Baseline: 20 Goal status: INITIAL  ASSESSMENT:  CLINICAL IMPRESSION: With remaining visits - questioned what was most difficult. She reported tandem walking so focused on some dynamic balance and patient performed well- very unsteady with retro walking and tandem but was able to improve with practice. Emphasized practicing at home between kitchen sink and michaelfurt table to be safe. Pt will continue to benefit from skilled physical therapy intervention to address impairments, improve QOL, and attain therapy goals.    OBJECTIVE IMPAIRMENTS: Decreased knowledge of condition, decreased use of DME, decreased mobility, difficulty walking, decreased strength, decreased ROM. ACTIVITY LIMITATIONS: Lifting,  standing, walking, squatting, transfers, locomotion level PARTICIPATION LIMITATIONS: Cleaning, laundry, interpersonal relationships, driving, yardwork, community activity.  PERSONAL FACTORS: Age, behavior pattern, education, past/current experiences, transportation, profession  are also affecting patient's functional outcome.  REHAB POTENTIAL: Good CLINICAL DECISION MAKING: Medium  EVALUATION COMPLEXITY: Moderate   PLAN:  PT FREQUENCY: 1-2x/week  PT DURATION: 8 weeks  PLANNED  INTERVENTIONS: 97110-Therapeutic exercises, 97530- Therapeutic activity, [id number]- Neuromuscular re-education, 97535- Self Care, 02859- Manual therapy, [phone]- Gait training, [phone]- Electrical stimulation (manual), Patient/Family education, Balance training, Stair training, Joint mobilization, Joint manipulation, DME instructions, Cryotherapy, and Moist heat  PLAN FOR NEXT SESSION:  Continue with dynamic progress balance activities Continue with Dual task activities with balance Continue with progressive LE strengthening Add to HEP as appropriate.   1:17 PM, 02/26/24 Chyrl London, PT Physical Therapist - [place] [facility]  Outpatient Physical Therapy- Main [facility] [phone]

## 2024-03-02 ENCOUNTER — Ambulatory Visit: Admitting: Physical Therapy

## 2024-03-02 DIAGNOSIS — R296 Repeated falls: Secondary | ICD-10-CM | POA: Diagnosis not present

## 2024-03-02 DIAGNOSIS — R2681 Unsteadiness on feet: Secondary | ICD-10-CM

## 2024-03-02 DIAGNOSIS — M6281 Muscle weakness (generalized): Secondary | ICD-10-CM

## 2024-03-02 NOTE — Therapy (Addendum)
 OUTPATIENT PHYSICAL THERAPY TREATMENT   Patient Name: Sonya Park MRN: [id number] DOB:1940-04-11, 83 y.o., female Today's Date: 03/02/2024  PCP: Liana Fish, NP REFERRING PROVIDER: Maree Jannett POUR, MD  END OF SESSION:  PT End of Session - 03/02/24 1150     Visit Number 19    Number of Visits 29    Date for Recertification  03/30/24    Authorization Type [employer] Medicare    Authorization Time Period Recert: 02/03/24-03/30/24    Authorization - Number of Visits 8    Progress Note Due on Visit 20    PT Start Time 1148    PT Stop Time 1228    PT Time Calculation (min) 40 min    Equipment Utilized During Treatment Gait belt    Activity Tolerance Patient tolerated treatment well;No increased pain    Behavior During Therapy WFL for tasks assessed/performed                Past Medical History:  Diagnosis Date   Arthritis    Back pain    Insomnia    Medical history non-contributory    Osteoporosis    Past Surgical History:  Procedure Laterality Date   ABDOMINAL HYSTERECTOMY     BREAST LUMPECTOMY Right    COLONOSCOPY WITH PROPOFOL  N/A 07/18/2015   Procedure: COLONOSCOPY WITH PROPOFOL ;  Surgeon: Rogelia Copping, MD;  Location: [facility];  Service: Endoscopy;  Laterality: N/A;   COSMETIC SURGERY     GASTRIC BYPASS     X2   HEMORROIDECTOMY     KNEE ARTHROPLASTY Right 12/13/2015   Procedure: COMPUTER ASSISTED TOTAL KNEE ARTHROPLASTY;  Surgeon: Lynwood SHAUNNA Hue, MD;  Location: [facility];  Service: Orthopedics;  Laterality: Right;   KNEE ARTHROSCOPY     REPLACEMENT TOTAL KNEE Left    SHOULDER SURGERY Right    Patient Active Problem List   Diagnosis Date Noted   Tremor of right hand 11/24/2023   Sacral back pain 11/24/2023   Closed fracture of left inferior pubic ramus (HCC) 06/25/2023   Closed fracture of left distal radius 06/14/2023   Closed pelvic fracture (HCC) 06/13/2023   HTN (hypertension) 06/13/2023   Chronic diastolic CHF (congestive heart failure)  (HCC) 06/13/2023   Wrist fracture, closed, left, initial encounter 06/13/2023   Vitamin D  deficiency 04/12/2023   Hypocalcemia 04/12/2023   Iron deficiency anemia 04/16/2022   Closed displaced intertrochanteric fracture of right femur (HCC) 03/05/2022   Closed fracture of proximal end of right humerus 03/05/2022   Fracture of right hip (HCC) 02/03/2022   Shoulder fracture, right, closed, initial encounter 02/03/2022   Fall at home, initial encounter 02/03/2022   Neck pain 10/24/2021   Irritable bowel syndrome 10/14/2021   Obstructive sleep apnea of adult 10/14/2021   Vulvovaginitis 07/27/2021   Hypoglycemia 04/11/2021   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 01/16/2021   Vertigo 06/09/2020   Rotator cuff arthropathy of right shoulder 03/22/2020   Hyperlipidemia 02/21/2020   Need for COVID-19 vaccine 02/14/2020   Essential hypertension 09/02/2019   Seasonal allergic rhinitis due to pollen 09/02/2019   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) 11/17/2017   Lymphedema 08/11/2017   Anemia, macrocytic, nutritional    Macrocytic anemia    Dysphagia    Itching    Screening for colon cancer    LFT elevation    Severe malnutrition    Adult failure to thrive    Hyperbilirubinemia  Thrombocytopenia    Pressure injury of skin 01/19/2017   Bacteremia    Palliative care by specialist    Goals of care, counseling/discussion    Alcohol abuse 01/15/2017   Facial trauma 01/14/2017   Osteoporosis 01/14/2017   Neutropenia 01/14/2017   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   Special screening for malignant neoplasms, colon    Benign neoplasm of transverse colon    Fracture of right inferior pubic ramus (HCC) 09/17/2014   Compression fracture of L3 vertebra (HCC) 09/17/2014   Closed wedge compression fracture of third lumbar vertebra (HCC) 09/17/2014   Other specified fracture  of right pubis, initial encounter for closed fracture (HCC) 09/17/2014   ONSET DATE: chronic REFERRING DIAG: falls THERAPY DIAG:  Repeated falls  Unsteadiness on feet  Muscle weakness (generalized)  Rationale for Evaluation and Treatment: Rehabilitation  SUBJECTIVE:                                                                                                                                                                                             SUBJECTIVE STATEMENT: Pt reports she had a close call with a fall due to a board that was sticking up and she caught her foot on it. Her husband was able to catch her so she did not fall.   PERTINENT HISTORY:   83yoF who is referred to OPPT for imbalance and multiple falls since 2018. Most recently sustained a pelvis fracture in 2025 s/p fall and Rt hip fracture s/p fall 2024. 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 Pt is currently back to her baseline which includes ad lib AMB without device, however she has not tolerated community distances in years due to chronic low back pain, is more recently limited by SOB and chest tightness with walking longer distances. Pt is fairly active at home, plans to leave here after evaluation visit and help her husband finish putting out more mulch at the house.   PAIN:  Are you having pain? no pain at rest    PRECAUTIONS: falls  WEIGHT BEARING RESTRICTIONS: None  FALLS: Has patient fallen in last 6 months? Yes   LIVING ENVIRONMENT: Lives with: husband  Lives in: 2 level house, lives on main level  Stairs: 2 separate steps Has following equipment at home: WC, 5 canes, walker  PLOF: has recently rehabbed back to walking without a cane  PATIENT GOALS: stop falling   OBJECTIVE:  TREATMENT DATE 03/02/24:  TA:  -Gait with 5# x 450 feet - VC to pick up feet and maintain pace with  increased distance  -Lateral stepping along 10 feet line with  5lb AW each LE x 5 laps ( 15 ft) *1 lap = 1 time each way to return to start point - Forward tandem walk or near tandem and retro normal walk x 4 laps  NMR: To facilitate reeducation of movement, balance, posture, coordination, and/or proprioception/kinesthetic sense.  SLS practice with frisbee slide around obstacle x 5 times around ea LE   TA- To improve functional movements patterns for everyday tasks   -Sit to stand without UE support x 10 rep  -Gait with 4# x 300 feet   NMR: To facilitate reeducation of movement, balance, posture, coordination, and/or proprioception/kinesthetic sense.   - stance on airex throwing and catching frisbee 3 x 30 sec      PATIENT EDUCATION: Education details: repeat explanation of [employer] parking and volunteer services offering; explained how Rt hip instability is causing Left toe catching issues;  Person educated: Patient Education method: discussion  Education comprehension: Fair (some memory retention difficulty)   HOME EXERCISE PROGRAM:  Access Code: [email or web address] URL: [email or web address] Date: 02/10/2024 Prepared by: Reyes London  Exercises - Seated Assisted Cervical Rotation with Towel  - 1 x daily - 3 sets - 10 reps   GOALS: Goals reviewed with patient? Yes   SHORT TERM GOALS: Target date: 03/02/2024  Pt to demonstrate improved cervical rotation ROM right side >40 degrees to improve safety with scanning driving and reduce need for full body rotation during household activity.  Baseline: eval: 30 degrees Rt rotation. 10/28: 28 degrees lateral rotation to the R  Goal status: NOT MET  2.  Pt to demonstrate 5xSTS in <11.5sec hands free and without LOB to demonstrate improve BLE power.  Baseline: 01/27/24: 11.16sec; 9.70sec Goal status: MET  3.  Pt will demonstrate ability to hold tandem stance balance > 20sec bilat to demonstrate improved hip  strength/proprioception for balance.  Baseline: 01/27/24: ~5sec each way 10/28: 60 sec ea LE Goal status: MET  LONG TERM GOALS: Target date:03/30/2024  Pt to improve Berg Balance Test score to >52 to indicate reduced risk of falls.  Baseline: 45 Goal status: MET  2.  Pt to demonstrate 5x STS <10.4sec hands free and without LOB to show improved power in BLE.  Baseline: 01/27/24: 11.16sec; 9.70sec Goal status: MET  3.  Pt to demonstrate ability to perform >60sec tandem stance bilat.  Baseline: <5secH 01/27/24: ~5secH 10/28: 60 sec ea LE, more difficulty / sway with R LE posterior  Goal status: MET  4.  Pt to demonstrate improved Right cervical rotation ROM >46 degrees to improve safety with visual scanning in car and while working in yard.  Baseline: 30 degrees 10/28: 28 degrees lateral rotation to the R  Goal status: NOT MET  5.  Pt will improve FGA by 3 points or greater in order to indicate improved dynamic balance and decreased fall risk.  Baseline: 20 Goal status: INITIAL  ASSESSMENT:  CLINICAL IMPRESSION:  Continued with current plan of care as laid out in evaluation and recent prior sessions. Pt remains motivated to advance progress toward goals in order to maximize independence and safety at home. Pt requires high level assistance and cuing for completion of exercises in order to provide adequate level of stimulation challenge while minimizing pain and discomfort when possible. Pt closely monitored throughout session pt response and to  maximize patient safety during interventions. Pt continues to demonstrate progress toward goals AEB progression of interventions this date either in volume or intensity.    OBJECTIVE IMPAIRMENTS: Decreased knowledge of condition, decreased use of DME, decreased mobility, difficulty walking, decreased strength, decreased ROM. ACTIVITY LIMITATIONS: Lifting, standing, walking, squatting, transfers, locomotion level PARTICIPATION LIMITATIONS:  Cleaning, laundry, interpersonal relationships, driving, yardwork, community activity.  PERSONAL FACTORS: Age, behavior pattern, education, past/current experiences, transportation, profession  are also affecting patient's functional outcome.  REHAB POTENTIAL: Good CLINICAL DECISION MAKING: Medium  EVALUATION COMPLEXITY: Moderate   PLAN:  PT FREQUENCY: 1-2x/week  PT DURATION: 8 weeks  PLANNED INTERVENTIONS: 97110-Therapeutic exercises, 97530- Therapeutic activity, [id number]- Neuromuscular re-education, 97535- Self Care, 02859- Manual therapy, [phone]- Gait training, [phone]- Electrical stimulation (manual), Patient/Family education, Balance training, Stair training, Joint mobilization, Joint manipulation, DME instructions, Cryotherapy, and Moist heat  PLAN FOR NEXT SESSION:  Continue with dynamic progress balance activities Continue with Dual task activities with balance Continue with progressive LE strengthening Add to HEP as appropriate.   2:39 PM, 03/02/24 Note: Portions of this document were prepared using Dragon voice recognition software and although reviewed may contain unintentional dictation errors in syntax, grammar, or spelling.  Lonni KATHEE Gainer PT ,DPT Physical Therapist- [place]  [facility]

## 2024-03-08 ENCOUNTER — Ambulatory Visit: Attending: Neurology

## 2024-03-08 DIAGNOSIS — M6281 Muscle weakness (generalized): Secondary | ICD-10-CM | POA: Insufficient documentation

## 2024-03-08 DIAGNOSIS — R2681 Unsteadiness on feet: Secondary | ICD-10-CM | POA: Insufficient documentation

## 2024-03-08 DIAGNOSIS — R296 Repeated falls: Secondary | ICD-10-CM | POA: Insufficient documentation

## 2024-03-08 NOTE — Therapy (Signed)
 OUTPATIENT PHYSICAL THERAPY TREATMENT & DISCHARGE  Patient Name: Sonya Park MRN: [id number] DOB:1941/01/08, 83 y.o., female Today's Date: 03/08/2024  PCP: Liana Fish, NP REFERRING PROVIDER: Maree Jannett POUR, MD  END OF SESSION:  PT End of Session - 03/08/24 0850     Visit Number 20    Number of Visits 29    Date for Recertification  03/30/24    Authorization Type [employer] Medicare    Authorization Time Period Recert: 02/03/24-03/30/24    Authorization - Visit Number 8    Authorization - Number of Visits 8    Progress Note Due on Visit 20    PT Start Time 0845    PT Stop Time 0925    PT Time Calculation (min) 40 min    Equipment Utilized During Treatment Gait belt    Activity Tolerance Patient tolerated treatment well;No increased pain    Behavior During Therapy WFL for tasks assessed/performed           Past Medical History:  Diagnosis Date   Arthritis    Back pain    Insomnia    Medical history non-contributory    Osteoporosis    Past Surgical History:  Procedure Laterality Date   ABDOMINAL HYSTERECTOMY     BREAST LUMPECTOMY Right    COLONOSCOPY WITH PROPOFOL  N/A 07/18/2015   Procedure: COLONOSCOPY WITH PROPOFOL ;  Surgeon: Rogelia Copping, MD;  Location: [facility];  Service: Endoscopy;  Laterality: N/A;   COSMETIC SURGERY     GASTRIC BYPASS     X2   HEMORROIDECTOMY     KNEE ARTHROPLASTY Right 12/13/2015   Procedure: COMPUTER ASSISTED TOTAL KNEE ARTHROPLASTY;  Surgeon: Lynwood SHAUNNA Hue, MD;  Location: [facility];  Service: Orthopedics;  Laterality: Right;   KNEE ARTHROSCOPY     REPLACEMENT TOTAL KNEE Left    SHOULDER SURGERY Right    Patient Active Problem List   Diagnosis Date Noted   Tremor of right hand 11/24/2023   Sacral back pain 11/24/2023   Closed fracture of left inferior pubic ramus (HCC) 06/25/2023   Closed fracture of left distal radius 06/14/2023   Closed pelvic fracture (HCC) 06/13/2023   HTN (hypertension) 06/13/2023   Chronic diastolic  CHF (congestive heart failure) (HCC) 06/13/2023   Wrist fracture, closed, left, initial encounter 06/13/2023   Vitamin D  deficiency 04/12/2023   Hypocalcemia 04/12/2023   Iron deficiency anemia 04/16/2022   Closed displaced intertrochanteric fracture of right femur (HCC) 03/05/2022   Closed fracture of proximal end of right humerus 03/05/2022   Fracture of right hip (HCC) 02/03/2022   Shoulder fracture, right, closed, initial encounter 02/03/2022   Fall at home, initial encounter 02/03/2022   Neck pain 10/24/2021   Irritable bowel syndrome 10/14/2021   Obstructive sleep apnea of adult 10/14/2021   Vulvovaginitis 07/27/2021   Hypoglycemia 04/11/2021   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 01/16/2021   Vertigo 06/09/2020   Rotator cuff arthropathy of right shoulder 03/22/2020   Hyperlipidemia 02/21/2020   Need for COVID-19 vaccine 02/14/2020   Essential hypertension 09/02/2019   Seasonal allergic rhinitis due to pollen 09/02/2019   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) 11/17/2017   Lymphedema 08/11/2017   Anemia, macrocytic, nutritional    Macrocytic anemia    Dysphagia    Itching    Screening for colon cancer    LFT elevation    Severe malnutrition    Adult failure to thrive  Hyperbilirubinemia    Thrombocytopenia    Pressure injury of skin 01/19/2017   Bacteremia    Palliative care by specialist    Goals of care, counseling/discussion    Alcohol abuse 01/15/2017   Facial trauma 01/14/2017   Osteoporosis 01/14/2017   Neutropenia 01/14/2017   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   Special screening for malignant neoplasms, colon    Benign neoplasm of transverse colon    Fracture of right inferior pubic ramus (HCC) 09/17/2014   Compression fracture of L3 vertebra (HCC) 09/17/2014   Closed wedge compression fracture of third lumbar vertebra (HCC)  09/17/2014   Other specified fracture of right pubis, initial encounter for closed fracture (HCC) 09/17/2014   ONSET DATE: chronic REFERRING DIAG: falls THERAPY DIAG:  Repeated falls  Unsteadiness on feet  Muscle weakness (generalized)  Rationale for Evaluation and Treatment: Rehabilitation  SUBJECTIVE:                                                                                                                                                                                             SUBJECTIVE STATEMENT: Pt had a nice thanksgiving holiday, no falls over the weekend. Pt is pretty tired today with her early appointment.    PERTINENT HISTORY:   83yoF who is referred to OPPT for imbalance and multiple falls since 2018. Most recently sustained a pelvis fracture in 2025 s/p fall and Rt hip fracture s/p fall 2024. 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 Pt is currently back to her baseline which includes ad lib AMB without device, however she has not tolerated community distances in years due to chronic low back pain, is more recently limited by SOB and chest tightness with walking longer distances. Pt is fairly active at home, plans to leave here after evaluation visit and help her husband finish putting out more mulch at the house.   PAIN:  Are you having pain? no pain at rest    PRECAUTIONS: falls  WEIGHT BEARING RESTRICTIONS: None  FALLS: Has patient fallen in last 6 months? Yes   LIVING ENVIRONMENT: Lives with: husband  Lives in: 2 level house, lives on main level  Stairs: 2 separate steps Has following equipment at home: WC, 5 canes, walker  PLOF: has recently rehabbed back to walking without a cane  PATIENT GOALS: stop falling   OBJECTIVE:  TREATMENT DATE 03/08/24: -side stepping 2x50ft bilat with 5lb AW (more fatigued today than typical)  *seated  recovery -side stepping 2x19ft bilat with 5lb AW bilat *seated recovery -alternating forward backward 4x87ft (5lb AW bilat)  *seated recovery -sides stepping over ground: red mat and 4 SPC step overs 3x67ft bilat, minGuardA *seated recovery -sides stepping over ground: red mat and 4 SPC step overs 3x44ft bilat, minGuardA *seated recovery -overground AMB with YTB around knees x330ft, 48m48s    PATIENT EDUCATION: Education details: repeat explanation of [employer] parking and volunteer services offering; explained how Rt hip instability is causing Left toe catching issues;  Person educated: Patient Education method: discussion  Education comprehension: Fair (some memory retention difficulty)   HOME EXERCISE PROGRAM: No updates for DC    GOALS: Goals reviewed with patient? Yes   SHORT TERM GOALS: Target date: 03/02/2024  Pt to demonstrate improved cervical rotation ROM right side >40 degrees to improve safety with scanning driving and reduce need for full body rotation during household activity.  Baseline: eval: 30 degrees Rt rotation. 10/28: 28 degrees lateral rotation to the R  Goal status: NOT MET  2.  Pt to demonstrate 5xSTS in <11.5sec hands free and without LOB to demonstrate improve BLE power.  Baseline: 01/27/24: 11.16sec; 9.70sec Goal status: MET  3.  Pt will demonstrate ability to hold tandem stance balance > 20sec bilat to demonstrate improved hip strength/proprioception for balance.  Baseline: 01/27/24: ~5sec each way 10/28: 60 sec ea LE Goal status: MET  LONG TERM GOALS: Target date:03/30/2024  Pt to improve Berg Balance Test score to >52 to indicate reduced risk of falls.  Baseline: 45 Goal status: MET  2.  Pt to demonstrate 5x STS <10.4sec hands free and without LOB to show improved power in BLE.  Baseline: 01/27/24: 11.16sec; 9.70sec Goal status: MET  3.  Pt to demonstrate ability to perform >60sec tandem stance bilat.  Baseline: <5secH 01/27/24: ~5secH  10/28: 60 sec ea LE, more difficulty / sway with R LE posterior  Goal status: MET  4.  Pt to demonstrate improved Right cervical rotation ROM >46 degrees to improve safety with visual scanning in car and while working in yard.  Baseline: 30 degrees 10/28: 28 degrees lateral rotation to the R  Goal status: NOT MET  5.  Pt will improve FGA by 3 points or greater in order to indicate improved dynamic balance and decreased fall risk.  Baseline: 20 Goal status: INITIAL  ASSESSMENT:  CLINICAL IMPRESSION: Continued with program to wrap things up today. Pt reports her HEP remains appropriately difficult to continue as previously issued. Pt has made generally good progress overall since being at PT. Pt will be DC at this time time due to goals met and insurance visit limitations.   OBJECTIVE IMPAIRMENTS: Decreased knowledge of condition, decreased use of DME, decreased mobility, difficulty walking, decreased strength, decreased ROM. ACTIVITY LIMITATIONS: Lifting, standing, walking, squatting, transfers, locomotion level PARTICIPATION LIMITATIONS: Cleaning, laundry, interpersonal relationships, driving, yardwork, community activity.  PERSONAL FACTORS: Age, behavior pattern, education, past/current experiences, transportation, profession  are also affecting patient's functional outcome.  REHAB POTENTIAL: Good CLINICAL DECISION MAKING: Medium  EVALUATION COMPLEXITY: Moderate   PLAN:  PT FREQUENCY: 1-2x/week  PT DURATION: 8 weeks  PLANNED INTERVENTIONS: 97110-Therapeutic exercises, 97530- Therapeutic activity, 97112- Neuromuscular re-education, 97535- Self Care, 02859- Manual therapy, [phone]- Gait training, [phone]- Electrical stimulation (manual), Patient/Family education, Balance training, Stair training, Joint mobilization, Joint manipulation, DME instructions, Cryotherapy, and Moist heat   8:51 AM, 03/08/24  Peggye JAYSON Linear, PT, DPT Physical Therapist - [place] [facility]  Outpatient Physical Therapy- [facility] [phone]

## 2024-03-11 ENCOUNTER — Ambulatory Visit

## 2024-03-12 ENCOUNTER — Telehealth: Payer: Self-pay | Admitting: Nurse Practitioner

## 2024-03-12 ENCOUNTER — Encounter: Payer: Self-pay | Admitting: Nurse Practitioner

## 2024-03-12 NOTE — Telephone Encounter (Signed)
 Cpap replacement order emailed to [facility]

## 2024-03-16 ENCOUNTER — Ambulatory Visit

## 2024-03-18 ENCOUNTER — Ambulatory Visit

## 2024-03-23 ENCOUNTER — Ambulatory Visit

## 2024-03-25 ENCOUNTER — Ambulatory Visit

## 2024-03-30 ENCOUNTER — Ambulatory Visit

## 2024-04-02 ENCOUNTER — Other Ambulatory Visit: Payer: Self-pay | Admitting: Nurse Practitioner

## 2024-04-06 ENCOUNTER — Ambulatory Visit

## 2024-04-13 ENCOUNTER — Ambulatory Visit (INDEPENDENT_AMBULATORY_CARE_PROVIDER_SITE_OTHER): Payer: Medicare Other | Admitting: Nurse Practitioner

## 2024-04-13 ENCOUNTER — Telehealth: Payer: Self-pay | Admitting: Nurse Practitioner

## 2024-04-13 ENCOUNTER — Ambulatory Visit

## 2024-04-13 ENCOUNTER — Encounter: Payer: Self-pay | Admitting: Nurse Practitioner

## 2024-04-13 VITALS — BP 124/68 | HR 78 | Temp 96.8°F | Resp 16 | Ht 66.0 in | Wt 143.4 lb

## 2024-04-13 DIAGNOSIS — G4733 Obstructive sleep apnea (adult) (pediatric): Secondary | ICD-10-CM | POA: Diagnosis not present

## 2024-04-13 DIAGNOSIS — Z0001 Encounter for general adult medical examination with abnormal findings: Secondary | ICD-10-CM

## 2024-04-13 DIAGNOSIS — I6529 Occlusion and stenosis of unspecified carotid artery: Secondary | ICD-10-CM

## 2024-04-13 DIAGNOSIS — M810 Age-related osteoporosis without current pathological fracture: Secondary | ICD-10-CM

## 2024-04-13 DIAGNOSIS — I5032 Chronic diastolic (congestive) heart failure: Secondary | ICD-10-CM | POA: Diagnosis not present

## 2024-04-13 DIAGNOSIS — K8070 Calculus of gallbladder and bile duct without cholecystitis without obstruction: Secondary | ICD-10-CM

## 2024-04-13 NOTE — Progress Notes (Addendum)
 [facility] [address] [place], [place] [address]  Internal MEDICINE  Office Visit Note  Patient Name: Sonya Park  [id number]  [id number]  Date of Service: 04/13/2024  Chief Complaint  Patient presents with   Medicare Wellness    HPI Sonya Park presents for a medicare annual wellness visit. Patient accompanied by her husband to her office visit today.  Well-appearing 84 y.o. female with GERD, prior gastric bypass surgery, IBS, allergic rhinitis, and osteoporosis.  Routine CRC screening: discontinued  Routine mammogram: as needed  DEXA scan: has osteoporosis, is getting annual reclast infusions with endocrinology.  Labs: up to date for now New or worsening pain: chronic pain but it is manageable.  Other concerns: none  OSA on CPAP -- needs a new CPAP machine, [facility]. She has previously had a sleep study when she was being seen by her previous PCP, Dr. Britta. Since she sees [facility], those reports should be available if needed.  Her current CPAP machine is at the end of use for life.  Her current cpap machine is out of warranty A replacement machine is necessary to assure reliable treatment for OSA She has been benefiting from CPAP therapy      04/13/2024   11:09 AM 04/11/2023   10:31 AM  MMSE - Mini Mental State Exam  Orientation to time 5 5  Orientation to Place 5 5  Registration 3 3  Attention/ Calculation 5 5  Recall 3 3  Language- name 2 objects 2 2  Language- repeat 1 1  Language- follow 3 step command 3 3  Language- read & follow direction 1 1  Write a sentence 1 1  Copy design 1 1  Total score 30 30    Functional Status Survey: Is the patient deaf or have difficulty hearing?: No Does the patient have difficulty seeing, even when wearing glasses/contacts?: Yes Does the patient have difficulty concentrating, remembering, or making decisions?: No Does the patient have difficulty walking or climbing stairs?:  No Does the patient have difficulty dressing or bathing?: Yes Does the patient have difficulty doing errands alone such as visiting a doctor's office or shopping?: No     03/05/2022    4:38 PM 03/14/2022    9:30 AM 03/14/2023   10:21 AM 04/11/2023   10:26 AM 04/13/2024   11:06 AM  Fall Risk  Falls in the past year? 1 1 1  0 1  Was there an injury with Fall? 1  1  1   0  0  Fall Risk Category Calculator 2 2 2  0 1  Fall Risk Category (Retired) Moderate  Moderate      (RETIRED) Patient Fall Risk Level Moderate fall risk  Moderate fall risk      Patient at Risk for Falls Due to History of fall(s);Impaired balance/gait;Impaired mobility Impaired balance/gait;Impaired mobility;Orthopedic patient;History of fall(s)  No Fall Risks   Fall risk Follow up Falls evaluation completed  Falls evaluation completed;Falls prevention discussed  Falls evaluation completed Falls evaluation completed Falls evaluation completed     Data saved with a previous flowsheet row definition       04/13/2024   11:07 AM  Depression screen PHQ 2/9  Decreased Interest 0  Down, Depressed, Hopeless 0  PHQ - 2 Score 0        Current Medication: Outpatient Encounter Medications as of 04/13/2024  Medication Sig Note   acetaminophen  (TYLENOL ) 325 MG tablet Take 2 tablets (650 mg total) by  mouth every 6 (six) hours as needed for mild pain (pain score 1-3) or fever.    aspirin  81 MG tablet Take 81 mg by mouth daily. Once a week (every Sunday)    Biotin (BIOTIN EXTRA STRENGTH) 10 MG CAPS Take by mouth.    calcium  citrate-vitamin D  (CITRACAL+D) 315-200 MG-UNIT tablet Take by mouth.    celecoxib (CELEBREX) 200 MG capsule Take 200 mg by mouth 2 (two) times daily.    Cyanocobalamin  (B-12) 3000 MCG CAPS Take by mouth.    Melatonin 5 MG TABS Take 2.5 mg by mouth. As needed 06/16/2023: prn   Multiple Vitamins-Minerals (BARIATRIC MULTIVITAMINS/IRON PO) Take 2 capsules by mouth every morning. After breakfast.    oxyCODONE  (OXY  IR/ROXICODONE ) 5 MG immediate release tablet Take 1 tablet (5 mg total) by mouth every 4 (four) hours as needed for severe pain (pain score 7-10).    PREVIDENT 5000 SENSITIVE 1.1-5 % GEL Place 1 Application onto teeth as directed. 06/16/2023: prn   spironolactone  (ALDACTONE ) 25 MG tablet Take 1 tablet (25 mg total) by mouth daily.    thiamine  100 MG tablet Take 1 tablet (100 mg total) daily by mouth.    [DISCONTINUED] Copper  Gluconate 2 MG CAPS Take 4 mg by mouth daily. (Patient not taking: Reported on 04/13/2024)    [DISCONTINUED] ferrous sulfate  (FEROSUL) 325 (65 FE) MG tablet Take 1 tablet (325 mg total) by mouth daily. (Patient not taking: Reported on 04/13/2024)    [DISCONTINUED] pantoprazole  (PROTONIX ) 40 MG tablet Take 1 tablet (40 mg total) by mouth 2 (two) times daily before a meal.    [DISCONTINUED] PROLIA 60 MG/ML SOSY injection Inject 60 mg into the skin every 6 (six) months. 06/16/2023: Patient states it's due now   No facility-administered encounter medications on file as of 04/13/2024.    Surgical History: Past Surgical History:  Procedure Laterality Date   ABDOMINAL HYSTERECTOMY     BREAST LUMPECTOMY Right    COLONOSCOPY WITH PROPOFOL  N/A 07/18/2015   Procedure: COLONOSCOPY WITH PROPOFOL ;  Surgeon: Rogelia Copping, MD;  Location: [facility] ENDOSCOPY;  Service: Endoscopy;  Laterality: N/A;   COSMETIC SURGERY     GASTRIC BYPASS     X2   HEMORROIDECTOMY     KNEE ARTHROPLASTY Right 12/13/2015   Procedure: COMPUTER ASSISTED TOTAL KNEE ARTHROPLASTY;  Surgeon: Lynwood SHAUNNA Hue, MD;  Location: [facility] ORS;  Service: Orthopedics;  Laterality: Right;   KNEE ARTHROSCOPY     REPLACEMENT TOTAL KNEE Left    SHOULDER SURGERY Right     Medical History: Past Medical History:  Diagnosis Date   Arthritis    Back pain    Insomnia    Medical history non-contributory    Osteoporosis     Family History: Family History  Problem Relation Age of Onset   Liver disease Mother    Heart attack Father     Cancer Father    CAD Sister     Social History   Socioeconomic History   Marital status: Married    Spouse name: Sharyon Peitz   Number of children: 1   Years of education: 12   Highest education level: Some college, no degree  Occupational History   Occupation: Retired  Tobacco Use   Smoking status: Former    Current packs/day: 0.00    Average packs/day: 2.0 packs/day for 29.0 years (58.0 ttl pk-yrs)    Types: Cigarettes    Start date: 11/29/1965    Quit date: 11/30/1994    Years since quitting: 29.3  Smokeless tobacco: Never  Vaping Use   Vaping status: Never Used  Substance and Sexual Activity   Alcohol use: Not Currently    Alcohol/week: 21.0 - 35.0 standard drinks of alcohol    Types: 21 - 35 Glasses of wine per week    Comment: 3-5 glasses of wine/ day   Drug use: No   Sexual activity: Not Currently  Other Topics Concern   Not on file  Social History Narrative   Not on file   Social Drivers of Health   Tobacco Use: Medium Risk (04/13/2024)   Patient History    Smoking Tobacco Use: Former    Smokeless Tobacco Use: Never    Passive Exposure: Not on Actuary Strain: Low Risk  (10/14/2023)   Received from [facility]   Overall Financial Resource Strain (CARDIA)    Difficulty of Paying Living Expenses: Not hard at all  Food Insecurity: No Food Insecurity (10/14/2023)   Received from [facility]   Epic    Within the past 12 months, you worried that your food would run out before you got the money to buy more.: Never true    Within the past 12 months, the food you bought just didn't last and you didn't have money to get more.: Never true  Transportation Needs: No Transportation Needs (10/14/2023)   Received from [facility] - Transportation    In the past 12 months, has lack of transportation kept you from medical appointments or from getting medications?: No    Lack of Transportation  (Non-Medical): No  Physical Activity: Inactive (03/14/2022)   Exercise Vital Sign    Days of Exercise per Week: 0 days    Minutes of Exercise per Session: 0 min  Stress: Stress Concern Present (03/14/2022)   [employer] - Occupational Stress Questionnaire    Feeling of Stress : To some extent  Social Connections: Moderately Isolated (06/13/2023)   Social Connection and Isolation Panel    Frequency of Communication with Friends and Family: More than three times a week    Frequency of Social Gatherings with Friends and Family: Once a week    Attends Religious Services: Never    Database Administrator or Organizations: No    Attends Banker Meetings: Never    Marital Status: Married  Catering Manager Violence: Not At Risk (06/13/2023)   Humiliation, Afraid, Rape, and Kick questionnaire    Fear of Current or Ex-Partner: No    Emotionally Abused: No    Physically Abused: No    Sexually Abused: No  Depression (PHQ2-9): Low Risk (04/13/2024)   Depression (PHQ2-9)    PHQ-2 Score: 0  Alcohol Screen: Low Risk (03/14/2022)   Alcohol Screen    Last Alcohol Screening Score (AUDIT): 0  Housing: Low Risk  (10/14/2023)   Received from [facility]   Epic    In the last 12 months, was there a time when you were not able to pay the mortgage or rent on time?: No    In the past 12 months, how many times have you moved where you were living?: 0    At any time in the past 12 months, were you homeless or living in a shelter (including now)?: No  Utilities: Not At Risk (10/14/2023)   Received from [facility]   Epic    In the past 12 months has the electric,  gas, oil, or water  company threatened to shut off services in your home?: No  Health Literacy: Not on file      Review of Systems  Constitutional:  Positive for activity change and fatigue. Negative for chills and unexpected weight change.  HENT:  Negative for congestion,  postnasal drip, rhinorrhea, sneezing and sore throat.   Eyes:  Negative for redness.  Respiratory: Negative.  Negative for cough, chest tightness and shortness of breath.   Cardiovascular: Negative.  Negative for chest pain and palpitations.  Gastrointestinal: Negative.  Negative for abdominal pain, constipation, diarrhea, nausea and vomiting.  Genitourinary:  Negative for dysuria and frequency.  Musculoskeletal:  Positive for arthralgias and gait problem. Negative for back pain, joint swelling and neck pain.  Skin:  Negative for rash.  Neurological:  Positive for weakness. Negative for tremors and numbness.  Hematological:  Negative for adenopathy. Does not bruise/bleed easily.  Psychiatric/Behavioral:  Negative for behavioral problems (Depression), sleep disturbance and suicidal ideas. The patient is not nervous/anxious.     Vital Signs: BP 124/68   Pulse 78   Temp (!) 96.8 F (36 C)   Resp 16   Ht 5' 6 (1.676 m)   Wt 143 lb 6.4 oz (65 kg)   SpO2 97%   BMI 23.15 kg/m    Physical Exam Vitals reviewed.  Constitutional:      General: She is not in acute distress.    Appearance: Normal appearance. She is well-developed and normal weight. She is not ill-appearing or diaphoretic.  HENT:     Head: Normocephalic and atraumatic.     Right Ear: Tympanic membrane, ear canal and external ear normal.     Left Ear: Tympanic membrane, ear canal and external ear normal.     Nose: Nose normal. No congestion or rhinorrhea.     Mouth/Throat:     Mouth: Mucous membranes are moist.     Pharynx: Oropharynx is clear. No oropharyngeal exudate.  Eyes:     Extraocular Movements: Extraocular movements intact.     Conjunctiva/sclera: Conjunctivae normal.     Pupils: Pupils are equal, round, and reactive to light.  Neck:     Thyroid : No thyromegaly.     Vascular: No JVD.     Trachea: No tracheal deviation.  Cardiovascular:     Rate and Rhythm: Normal rate and regular rhythm.     Heart sounds:  Normal heart sounds. No murmur heard.    No friction rub. No gallop.  Pulmonary:     Effort: Pulmonary effort is normal. No respiratory distress.     Breath sounds: Normal breath sounds. No wheezing or rales.  Chest:     Chest wall: No tenderness.  Abdominal:     General: Bowel sounds are normal.     Palpations: Abdomen is soft.  Musculoskeletal:        General: Normal range of motion.     Cervical back: Normal range of motion and neck supple.  Lymphadenopathy:     Cervical: No cervical adenopathy.  Skin:    General: Skin is warm and dry.     Capillary Refill: Capillary refill takes less than 2 seconds.  Neurological:     Mental Status: She is alert and oriented to person, place, and time.     Cranial Nerves: No cranial nerve deficit.  Psychiatric:        Mood and Affect: Mood normal.        Behavior: Behavior normal.  Thought Content: Thought content normal.        Judgment: Judgment normal.        Assessment/Plan: 1. Encounter for Medicare annual examination with abnormal findings (Primary) Age-appropriate preventive screenings and vaccinations discussed. Routine labs for health maintenance are up to date. PHM updated.    2. Carotid atherosclerosis, unspecified laterality Carotid ultrasound ordered.  - US  Carotid Bilateral; Future  3. Calculus of gallbladder and bile duct without cholecystitis or obstruction Discussed possible treatment options. Patient will call the clinic if the pain worsens or becomes more persistent or frequent for a referral to general surgery.  4. Age-related osteoporosis without current pathological fracture Continue to follow up with endocrinology and continue annual reclast infusions.   5. Chronic diastolic CHF (congestive heart failure) (HCC) Noted, continue medications as prescribed.   6. OSA on CPAP New CPAP ordered, [facility]  - For home use only DME continuous positive airway pressure  (CPAP)      General Counseling: Mattilyn verbalizes understanding of the findings of todays visit and agrees with plan of treatment. I have discussed any further diagnostic evaluation that may be needed or ordered today. We also reviewed her medications today. she has been encouraged to call the office with any questions or concerns that should arise related to todays visit.    Orders Placed This Encounter  Procedures   US  Carotid Bilateral    No orders of the defined types were placed in this encounter.   Return in about 1 month (around 05/14/2024) for F/U, Eythan Jayne PCP for ultrasound results. .   Total time spent:30 Minutes Time spent includes review of chart, medications, test results, and follow up plan with the patient.   [place] Controlled Substance Database was reviewed by me.  This patient was seen by Mardy Maxin, FNP-C in collaboration with Dr. Sigrid Bathe as a part of collaborative care agreement.  Nerine Pulse R. Maxin, MSN, FNP-C Internal medicine

## 2024-04-13 NOTE — Telephone Encounter (Signed)
 Notified patient of U/S appointment date, arrival time, location-[facility]

## 2024-04-14 ENCOUNTER — Encounter: Payer: Self-pay | Admitting: Nurse Practitioner

## 2024-04-14 DIAGNOSIS — K8070 Calculus of gallbladder and bile duct without cholecystitis without obstruction: Secondary | ICD-10-CM | POA: Insufficient documentation

## 2024-04-14 DIAGNOSIS — I6529 Occlusion and stenosis of unspecified carotid artery: Secondary | ICD-10-CM | POA: Insufficient documentation

## 2024-04-15 ENCOUNTER — Ambulatory Visit

## 2024-04-16 ENCOUNTER — Ambulatory Visit
Admission: RE | Admit: 2024-04-16 | Discharge: 2024-04-16 | Disposition: A | Source: Ambulatory Visit | Attending: Nurse Practitioner

## 2024-04-16 DIAGNOSIS — I6529 Occlusion and stenosis of unspecified carotid artery: Secondary | ICD-10-CM | POA: Insufficient documentation

## 2024-04-20 ENCOUNTER — Ambulatory Visit

## 2024-04-22 ENCOUNTER — Ambulatory Visit

## 2024-04-27 ENCOUNTER — Ambulatory Visit

## 2024-04-29 ENCOUNTER — Ambulatory Visit

## 2024-05-06 ENCOUNTER — Telehealth: Payer: Self-pay | Admitting: Nurse Practitioner

## 2024-05-06 NOTE — Telephone Encounter (Signed)
 Updated order and office notes for cpap replacement emailed to [facility]

## 2024-05-06 NOTE — Addendum Note (Signed)
 Addended by: Dalissa Lovin on: 05/06/2024 08:15 AM   Modules accepted: Orders

## 2024-05-17 ENCOUNTER — Ambulatory Visit: Admitting: Nurse Practitioner

## 2025-04-14 ENCOUNTER — Ambulatory Visit: Admitting: Nurse Practitioner
